# Patient Record
Sex: Female | Born: 1950
Health system: Southern US, Community
[De-identification: ages and names within clinical notes are randomized; demographics above are authoritative.]

## PROBLEM LIST (undated history)

## (undated) DIAGNOSIS — G4730 Sleep apnea, unspecified: Secondary | ICD-10-CM

## (undated) DIAGNOSIS — K219 Gastro-esophageal reflux disease without esophagitis: Secondary | ICD-10-CM

## (undated) DIAGNOSIS — I10 Essential (primary) hypertension: Secondary | ICD-10-CM

## (undated) DIAGNOSIS — J449 Chronic obstructive pulmonary disease, unspecified: Secondary | ICD-10-CM

## (undated) DIAGNOSIS — K5900 Constipation, unspecified: Secondary | ICD-10-CM

## (undated) DIAGNOSIS — F419 Anxiety disorder, unspecified: Secondary | ICD-10-CM

## (undated) DIAGNOSIS — I219 Acute myocardial infarction, unspecified: Secondary | ICD-10-CM

## (undated) DIAGNOSIS — J45909 Unspecified asthma, uncomplicated: Secondary | ICD-10-CM

## (undated) DIAGNOSIS — E119 Type 2 diabetes mellitus without complications: Secondary | ICD-10-CM

## (undated) DIAGNOSIS — H409 Unspecified glaucoma: Secondary | ICD-10-CM

## (undated) DIAGNOSIS — K635 Polyp of colon: Secondary | ICD-10-CM

## (undated) DIAGNOSIS — I639 Cerebral infarction, unspecified: Secondary | ICD-10-CM

## (undated) DIAGNOSIS — R32 Unspecified urinary incontinence: Secondary | ICD-10-CM

## (undated) DIAGNOSIS — F329 Major depressive disorder, single episode, unspecified: Secondary | ICD-10-CM

## (undated) DIAGNOSIS — T7840XA Allergy, unspecified, initial encounter: Secondary | ICD-10-CM

## (undated) DIAGNOSIS — F32A Depression, unspecified: Secondary | ICD-10-CM

## (undated) HISTORY — DX: Depression, unspecified: F32.A

## (undated) HISTORY — DX: Cerebral infarction, unspecified: I63.9

## (undated) HISTORY — PX: CHOLECYSTECTOMY: SHX55

## (undated) HISTORY — PX: VAGINAL HYSTERECTOMY: SUR661

## (undated) HISTORY — PX: APPENDECTOMY: SHX54

## (undated) HISTORY — DX: Type 2 diabetes mellitus without complications: E11.9

## (undated) HISTORY — DX: Polyp of colon: K63.5

## (undated) HISTORY — PX: GASTRECTOMY: SHX58

## (undated) HISTORY — DX: Acute myocardial infarction, unspecified: I21.9

## (undated) HISTORY — DX: Unspecified glaucoma: H40.9

## (undated) HISTORY — DX: Chronic obstructive pulmonary disease, unspecified: J44.9

## (undated) HISTORY — DX: Essential (primary) hypertension: I10

## (undated) HISTORY — DX: Major depressive disorder, single episode, unspecified: F32.9

## (undated) HISTORY — DX: Constipation, unspecified: K59.00

## (undated) HISTORY — DX: Unspecified urinary incontinence: R32

## (undated) HISTORY — DX: Anxiety disorder, unspecified: F41.9

## (undated) HISTORY — DX: Allergy, unspecified, initial encounter: T78.40XA

## (undated) HISTORY — DX: Gastro-esophageal reflux disease without esophagitis: K21.9

## (undated) HISTORY — PX: TUBAL LIGATION: SHX77

---

## 1998-03-04 ENCOUNTER — Other Ambulatory Visit: Admission: RE | Admit: 1998-03-04 | Discharge: 1998-03-04 | Payer: Self-pay | Admitting: Family Medicine

## 2005-06-18 ENCOUNTER — Ambulatory Visit: Payer: Self-pay | Admitting: Internal Medicine

## 2006-06-23 ENCOUNTER — Ambulatory Visit: Payer: Self-pay | Admitting: Internal Medicine

## 2006-10-22 ENCOUNTER — Emergency Department: Payer: Self-pay | Admitting: Emergency Medicine

## 2006-10-22 ENCOUNTER — Other Ambulatory Visit: Payer: Self-pay

## 2006-11-05 ENCOUNTER — Ambulatory Visit: Payer: Self-pay | Admitting: Internal Medicine

## 2007-06-30 ENCOUNTER — Ambulatory Visit: Payer: Self-pay | Admitting: Gastroenterology

## 2007-07-27 ENCOUNTER — Ambulatory Visit: Payer: Self-pay | Admitting: Internal Medicine

## 2007-09-02 ENCOUNTER — Ambulatory Visit: Payer: Self-pay | Admitting: Internal Medicine

## 2007-09-16 ENCOUNTER — Ambulatory Visit: Payer: Self-pay | Admitting: Internal Medicine

## 2007-09-22 ENCOUNTER — Ambulatory Visit: Payer: Self-pay | Admitting: Internal Medicine

## 2008-06-08 ENCOUNTER — Ambulatory Visit: Payer: Self-pay | Admitting: Podiatry

## 2008-06-12 ENCOUNTER — Ambulatory Visit: Payer: Self-pay | Admitting: Podiatry

## 2008-06-15 ENCOUNTER — Ambulatory Visit: Payer: Self-pay | Admitting: Podiatry

## 2008-06-15 ENCOUNTER — Emergency Department: Payer: Self-pay | Admitting: Emergency Medicine

## 2008-09-11 ENCOUNTER — Ambulatory Visit: Payer: Self-pay | Admitting: Internal Medicine

## 2009-03-13 ENCOUNTER — Ambulatory Visit: Payer: Self-pay | Admitting: Internal Medicine

## 2010-01-30 ENCOUNTER — Ambulatory Visit: Payer: Self-pay | Admitting: Internal Medicine

## 2011-06-19 ENCOUNTER — Ambulatory Visit: Payer: Self-pay

## 2011-12-08 DIAGNOSIS — F331 Major depressive disorder, recurrent, moderate: Secondary | ICD-10-CM | POA: Diagnosis not present

## 2011-12-15 DIAGNOSIS — F331 Major depressive disorder, recurrent, moderate: Secondary | ICD-10-CM | POA: Diagnosis not present

## 2011-12-21 DIAGNOSIS — Z634 Disappearance and death of family member: Secondary | ICD-10-CM | POA: Diagnosis not present

## 2011-12-21 DIAGNOSIS — F4310 Post-traumatic stress disorder, unspecified: Secondary | ICD-10-CM | POA: Diagnosis not present

## 2011-12-21 DIAGNOSIS — F331 Major depressive disorder, recurrent, moderate: Secondary | ICD-10-CM | POA: Diagnosis not present

## 2011-12-22 DIAGNOSIS — F331 Major depressive disorder, recurrent, moderate: Secondary | ICD-10-CM | POA: Diagnosis not present

## 2011-12-29 DIAGNOSIS — F331 Major depressive disorder, recurrent, moderate: Secondary | ICD-10-CM | POA: Diagnosis not present

## 2012-01-05 DIAGNOSIS — F331 Major depressive disorder, recurrent, moderate: Secondary | ICD-10-CM | POA: Diagnosis not present

## 2012-01-19 DIAGNOSIS — F331 Major depressive disorder, recurrent, moderate: Secondary | ICD-10-CM | POA: Diagnosis not present

## 2012-02-11 DIAGNOSIS — F331 Major depressive disorder, recurrent, moderate: Secondary | ICD-10-CM | POA: Diagnosis not present

## 2012-02-11 DIAGNOSIS — F4310 Post-traumatic stress disorder, unspecified: Secondary | ICD-10-CM | POA: Diagnosis not present

## 2012-02-25 DIAGNOSIS — F331 Major depressive disorder, recurrent, moderate: Secondary | ICD-10-CM | POA: Diagnosis not present

## 2012-03-01 DIAGNOSIS — F331 Major depressive disorder, recurrent, moderate: Secondary | ICD-10-CM | POA: Diagnosis not present

## 2012-03-08 DIAGNOSIS — F331 Major depressive disorder, recurrent, moderate: Secondary | ICD-10-CM | POA: Diagnosis not present

## 2012-03-15 DIAGNOSIS — F331 Major depressive disorder, recurrent, moderate: Secondary | ICD-10-CM | POA: Diagnosis not present

## 2012-03-21 DIAGNOSIS — G4710 Hypersomnia, unspecified: Secondary | ICD-10-CM | POA: Diagnosis not present

## 2012-03-21 DIAGNOSIS — J45909 Unspecified asthma, uncomplicated: Secondary | ICD-10-CM | POA: Diagnosis not present

## 2012-03-21 DIAGNOSIS — J309 Allergic rhinitis, unspecified: Secondary | ICD-10-CM | POA: Diagnosis not present

## 2012-03-23 DIAGNOSIS — F331 Major depressive disorder, recurrent, moderate: Secondary | ICD-10-CM | POA: Diagnosis not present

## 2012-04-12 DIAGNOSIS — F331 Major depressive disorder, recurrent, moderate: Secondary | ICD-10-CM | POA: Diagnosis not present

## 2012-04-26 DIAGNOSIS — F331 Major depressive disorder, recurrent, moderate: Secondary | ICD-10-CM | POA: Diagnosis not present

## 2012-05-03 DIAGNOSIS — F331 Major depressive disorder, recurrent, moderate: Secondary | ICD-10-CM | POA: Diagnosis not present

## 2012-05-12 DIAGNOSIS — F331 Major depressive disorder, recurrent, moderate: Secondary | ICD-10-CM | POA: Diagnosis not present

## 2012-05-12 DIAGNOSIS — F4310 Post-traumatic stress disorder, unspecified: Secondary | ICD-10-CM | POA: Diagnosis not present

## 2012-05-23 DIAGNOSIS — R5383 Other fatigue: Secondary | ICD-10-CM | POA: Diagnosis not present

## 2012-05-23 DIAGNOSIS — D649 Anemia, unspecified: Secondary | ICD-10-CM | POA: Diagnosis not present

## 2012-05-23 DIAGNOSIS — I10 Essential (primary) hypertension: Secondary | ICD-10-CM | POA: Diagnosis not present

## 2012-05-23 DIAGNOSIS — R5381 Other malaise: Secondary | ICD-10-CM | POA: Diagnosis not present

## 2012-05-23 DIAGNOSIS — J45909 Unspecified asthma, uncomplicated: Secondary | ICD-10-CM | POA: Diagnosis not present

## 2012-05-23 DIAGNOSIS — E119 Type 2 diabetes mellitus without complications: Secondary | ICD-10-CM | POA: Diagnosis not present

## 2012-05-23 DIAGNOSIS — F411 Generalized anxiety disorder: Secondary | ICD-10-CM | POA: Diagnosis not present

## 2012-05-23 DIAGNOSIS — J309 Allergic rhinitis, unspecified: Secondary | ICD-10-CM | POA: Diagnosis not present

## 2012-05-24 DIAGNOSIS — F331 Major depressive disorder, recurrent, moderate: Secondary | ICD-10-CM | POA: Diagnosis not present

## 2012-05-25 DIAGNOSIS — H409 Unspecified glaucoma: Secondary | ICD-10-CM | POA: Diagnosis not present

## 2012-05-25 DIAGNOSIS — H4011X0 Primary open-angle glaucoma, stage unspecified: Secondary | ICD-10-CM | POA: Diagnosis not present

## 2012-06-14 DIAGNOSIS — F331 Major depressive disorder, recurrent, moderate: Secondary | ICD-10-CM | POA: Diagnosis not present

## 2012-06-20 ENCOUNTER — Ambulatory Visit: Payer: Self-pay

## 2012-06-20 DIAGNOSIS — Z1231 Encounter for screening mammogram for malignant neoplasm of breast: Secondary | ICD-10-CM | POA: Diagnosis not present

## 2012-06-28 DIAGNOSIS — F331 Major depressive disorder, recurrent, moderate: Secondary | ICD-10-CM | POA: Diagnosis not present

## 2012-07-07 DIAGNOSIS — Z634 Disappearance and death of family member: Secondary | ICD-10-CM | POA: Diagnosis not present

## 2012-07-07 DIAGNOSIS — F339 Major depressive disorder, recurrent, unspecified: Secondary | ICD-10-CM | POA: Diagnosis not present

## 2012-07-07 DIAGNOSIS — F411 Generalized anxiety disorder: Secondary | ICD-10-CM | POA: Diagnosis not present

## 2012-07-07 DIAGNOSIS — Z79899 Other long term (current) drug therapy: Secondary | ICD-10-CM | POA: Diagnosis not present

## 2012-07-07 DIAGNOSIS — Z1339 Encounter for screening examination for other mental health and behavioral disorders: Secondary | ICD-10-CM | POA: Diagnosis not present

## 2012-07-07 DIAGNOSIS — Z5181 Encounter for therapeutic drug level monitoring: Secondary | ICD-10-CM | POA: Diagnosis not present

## 2012-07-12 DIAGNOSIS — F331 Major depressive disorder, recurrent, moderate: Secondary | ICD-10-CM | POA: Diagnosis not present

## 2012-07-19 DIAGNOSIS — H1130 Conjunctival hemorrhage, unspecified eye: Secondary | ICD-10-CM | POA: Diagnosis not present

## 2012-07-26 DIAGNOSIS — F331 Major depressive disorder, recurrent, moderate: Secondary | ICD-10-CM | POA: Diagnosis not present

## 2012-08-02 DIAGNOSIS — F331 Major depressive disorder, recurrent, moderate: Secondary | ICD-10-CM | POA: Diagnosis not present

## 2012-08-05 DIAGNOSIS — E559 Vitamin D deficiency, unspecified: Secondary | ICD-10-CM | POA: Diagnosis not present

## 2012-08-05 DIAGNOSIS — F411 Generalized anxiety disorder: Secondary | ICD-10-CM | POA: Diagnosis not present

## 2012-08-05 DIAGNOSIS — R300 Dysuria: Secondary | ICD-10-CM | POA: Diagnosis not present

## 2012-08-05 DIAGNOSIS — IMO0001 Reserved for inherently not codable concepts without codable children: Secondary | ICD-10-CM | POA: Diagnosis not present

## 2012-08-05 DIAGNOSIS — E038 Other specified hypothyroidism: Secondary | ICD-10-CM | POA: Diagnosis not present

## 2012-08-05 DIAGNOSIS — E119 Type 2 diabetes mellitus without complications: Secondary | ICD-10-CM | POA: Diagnosis not present

## 2012-08-05 DIAGNOSIS — J209 Acute bronchitis, unspecified: Secondary | ICD-10-CM | POA: Diagnosis not present

## 2012-08-05 DIAGNOSIS — R05 Cough: Secondary | ICD-10-CM | POA: Diagnosis not present

## 2012-08-05 DIAGNOSIS — Z0000 Encounter for general adult medical examination without abnormal findings: Secondary | ICD-10-CM | POA: Diagnosis not present

## 2012-08-05 DIAGNOSIS — R059 Cough, unspecified: Secondary | ICD-10-CM | POA: Diagnosis not present

## 2012-08-05 DIAGNOSIS — I10 Essential (primary) hypertension: Secondary | ICD-10-CM | POA: Diagnosis not present

## 2012-08-16 DIAGNOSIS — F331 Major depressive disorder, recurrent, moderate: Secondary | ICD-10-CM | POA: Diagnosis not present

## 2012-08-17 DIAGNOSIS — E876 Hypokalemia: Secondary | ICD-10-CM | POA: Diagnosis not present

## 2012-08-17 DIAGNOSIS — J45909 Unspecified asthma, uncomplicated: Secondary | ICD-10-CM | POA: Diagnosis not present

## 2012-08-17 DIAGNOSIS — R059 Cough, unspecified: Secondary | ICD-10-CM | POA: Diagnosis not present

## 2012-08-17 DIAGNOSIS — E559 Vitamin D deficiency, unspecified: Secondary | ICD-10-CM | POA: Diagnosis not present

## 2012-08-17 DIAGNOSIS — E119 Type 2 diabetes mellitus without complications: Secondary | ICD-10-CM | POA: Diagnosis not present

## 2012-08-17 DIAGNOSIS — J209 Acute bronchitis, unspecified: Secondary | ICD-10-CM | POA: Diagnosis not present

## 2012-08-17 DIAGNOSIS — R05 Cough: Secondary | ICD-10-CM | POA: Diagnosis not present

## 2012-08-30 DIAGNOSIS — F331 Major depressive disorder, recurrent, moderate: Secondary | ICD-10-CM | POA: Diagnosis not present

## 2012-09-06 DIAGNOSIS — F331 Major depressive disorder, recurrent, moderate: Secondary | ICD-10-CM | POA: Diagnosis not present

## 2012-09-14 DIAGNOSIS — F4310 Post-traumatic stress disorder, unspecified: Secondary | ICD-10-CM | POA: Diagnosis not present

## 2012-09-14 DIAGNOSIS — F339 Major depressive disorder, recurrent, unspecified: Secondary | ICD-10-CM | POA: Diagnosis not present

## 2012-09-14 DIAGNOSIS — F39 Unspecified mood [affective] disorder: Secondary | ICD-10-CM | POA: Diagnosis not present

## 2012-09-15 DIAGNOSIS — F17200 Nicotine dependence, unspecified, uncomplicated: Secondary | ICD-10-CM | POA: Diagnosis not present

## 2012-09-15 DIAGNOSIS — Z23 Encounter for immunization: Secondary | ICD-10-CM | POA: Diagnosis not present

## 2012-09-15 DIAGNOSIS — G4730 Sleep apnea, unspecified: Secondary | ICD-10-CM | POA: Diagnosis not present

## 2012-09-15 DIAGNOSIS — J45909 Unspecified asthma, uncomplicated: Secondary | ICD-10-CM | POA: Diagnosis not present

## 2012-09-15 DIAGNOSIS — G4710 Hypersomnia, unspecified: Secondary | ICD-10-CM | POA: Diagnosis not present

## 2012-09-27 DIAGNOSIS — F331 Major depressive disorder, recurrent, moderate: Secondary | ICD-10-CM | POA: Diagnosis not present

## 2012-10-11 DIAGNOSIS — F331 Major depressive disorder, recurrent, moderate: Secondary | ICD-10-CM | POA: Diagnosis not present

## 2012-10-20 DIAGNOSIS — H409 Unspecified glaucoma: Secondary | ICD-10-CM | POA: Diagnosis not present

## 2012-10-20 DIAGNOSIS — H4011X0 Primary open-angle glaucoma, stage unspecified: Secondary | ICD-10-CM | POA: Diagnosis not present

## 2012-11-08 DIAGNOSIS — F331 Major depressive disorder, recurrent, moderate: Secondary | ICD-10-CM | POA: Diagnosis not present

## 2012-11-15 DIAGNOSIS — F331 Major depressive disorder, recurrent, moderate: Secondary | ICD-10-CM | POA: Diagnosis not present

## 2012-11-17 ENCOUNTER — Ambulatory Visit: Payer: Self-pay | Admitting: Gastroenterology

## 2012-11-17 DIAGNOSIS — Z79899 Other long term (current) drug therapy: Secondary | ICD-10-CM | POA: Diagnosis not present

## 2012-11-17 DIAGNOSIS — Z800 Family history of malignant neoplasm of digestive organs: Secondary | ICD-10-CM | POA: Diagnosis not present

## 2012-11-17 DIAGNOSIS — D128 Benign neoplasm of rectum: Secondary | ICD-10-CM | POA: Diagnosis not present

## 2012-11-17 DIAGNOSIS — Z1211 Encounter for screening for malignant neoplasm of colon: Secondary | ICD-10-CM | POA: Diagnosis not present

## 2012-11-17 DIAGNOSIS — Z888 Allergy status to other drugs, medicaments and biological substances status: Secondary | ICD-10-CM | POA: Diagnosis not present

## 2012-11-17 DIAGNOSIS — D129 Benign neoplasm of anus and anal canal: Secondary | ICD-10-CM | POA: Diagnosis not present

## 2012-11-17 DIAGNOSIS — G609 Hereditary and idiopathic neuropathy, unspecified: Secondary | ICD-10-CM | POA: Diagnosis not present

## 2012-11-17 DIAGNOSIS — F17200 Nicotine dependence, unspecified, uncomplicated: Secondary | ICD-10-CM | POA: Diagnosis not present

## 2012-11-17 DIAGNOSIS — D126 Benign neoplasm of colon, unspecified: Secondary | ICD-10-CM | POA: Diagnosis not present

## 2012-11-17 DIAGNOSIS — J45909 Unspecified asthma, uncomplicated: Secondary | ICD-10-CM | POA: Diagnosis not present

## 2012-11-18 LAB — PATHOLOGY REPORT

## 2012-11-22 DIAGNOSIS — F331 Major depressive disorder, recurrent, moderate: Secondary | ICD-10-CM | POA: Diagnosis not present

## 2012-11-28 DIAGNOSIS — B353 Tinea pedis: Secondary | ICD-10-CM | POA: Diagnosis not present

## 2012-11-28 DIAGNOSIS — K219 Gastro-esophageal reflux disease without esophagitis: Secondary | ICD-10-CM | POA: Diagnosis not present

## 2012-11-28 DIAGNOSIS — E119 Type 2 diabetes mellitus without complications: Secondary | ICD-10-CM | POA: Diagnosis not present

## 2012-11-28 DIAGNOSIS — B351 Tinea unguium: Secondary | ICD-10-CM | POA: Diagnosis not present

## 2012-11-28 DIAGNOSIS — M25519 Pain in unspecified shoulder: Secondary | ICD-10-CM | POA: Diagnosis not present

## 2012-11-28 DIAGNOSIS — I10 Essential (primary) hypertension: Secondary | ICD-10-CM | POA: Diagnosis not present

## 2012-11-28 DIAGNOSIS — J45909 Unspecified asthma, uncomplicated: Secondary | ICD-10-CM | POA: Diagnosis not present

## 2012-11-29 DIAGNOSIS — H409 Unspecified glaucoma: Secondary | ICD-10-CM | POA: Diagnosis not present

## 2012-11-29 DIAGNOSIS — H4011X0 Primary open-angle glaucoma, stage unspecified: Secondary | ICD-10-CM | POA: Diagnosis not present

## 2012-12-27 DIAGNOSIS — F331 Major depressive disorder, recurrent, moderate: Secondary | ICD-10-CM | POA: Diagnosis not present

## 2013-01-05 DIAGNOSIS — F39 Unspecified mood [affective] disorder: Secondary | ICD-10-CM | POA: Diagnosis not present

## 2013-01-05 DIAGNOSIS — F411 Generalized anxiety disorder: Secondary | ICD-10-CM | POA: Diagnosis not present

## 2013-01-05 DIAGNOSIS — F339 Major depressive disorder, recurrent, unspecified: Secondary | ICD-10-CM | POA: Diagnosis not present

## 2013-01-05 DIAGNOSIS — Z5181 Encounter for therapeutic drug level monitoring: Secondary | ICD-10-CM | POA: Diagnosis not present

## 2013-01-05 DIAGNOSIS — F4310 Post-traumatic stress disorder, unspecified: Secondary | ICD-10-CM | POA: Diagnosis not present

## 2013-01-05 DIAGNOSIS — Z634 Disappearance and death of family member: Secondary | ICD-10-CM | POA: Diagnosis not present

## 2013-01-05 DIAGNOSIS — Z1339 Encounter for screening examination for other mental health and behavioral disorders: Secondary | ICD-10-CM | POA: Diagnosis not present

## 2013-01-05 DIAGNOSIS — Z79899 Other long term (current) drug therapy: Secondary | ICD-10-CM | POA: Diagnosis not present

## 2013-01-24 DIAGNOSIS — F331 Major depressive disorder, recurrent, moderate: Secondary | ICD-10-CM | POA: Diagnosis not present

## 2013-01-27 DIAGNOSIS — B351 Tinea unguium: Secondary | ICD-10-CM | POA: Diagnosis not present

## 2013-01-27 DIAGNOSIS — M79609 Pain in unspecified limb: Secondary | ICD-10-CM | POA: Diagnosis not present

## 2013-01-27 DIAGNOSIS — M2040 Other hammer toe(s) (acquired), unspecified foot: Secondary | ICD-10-CM | POA: Diagnosis not present

## 2013-01-27 DIAGNOSIS — M2020 Hallux rigidus, unspecified foot: Secondary | ICD-10-CM | POA: Diagnosis not present

## 2013-01-31 DIAGNOSIS — F331 Major depressive disorder, recurrent, moderate: Secondary | ICD-10-CM | POA: Diagnosis not present

## 2013-02-07 DIAGNOSIS — F331 Major depressive disorder, recurrent, moderate: Secondary | ICD-10-CM | POA: Diagnosis not present

## 2013-02-28 DIAGNOSIS — F331 Major depressive disorder, recurrent, moderate: Secondary | ICD-10-CM | POA: Diagnosis not present

## 2013-03-03 DIAGNOSIS — J45909 Unspecified asthma, uncomplicated: Secondary | ICD-10-CM | POA: Diagnosis not present

## 2013-03-03 DIAGNOSIS — N39498 Other specified urinary incontinence: Secondary | ICD-10-CM | POA: Diagnosis not present

## 2013-03-03 DIAGNOSIS — N3945 Continuous leakage: Secondary | ICD-10-CM | POA: Diagnosis not present

## 2013-03-03 DIAGNOSIS — I10 Essential (primary) hypertension: Secondary | ICD-10-CM | POA: Diagnosis not present

## 2013-03-03 DIAGNOSIS — E119 Type 2 diabetes mellitus without complications: Secondary | ICD-10-CM | POA: Diagnosis not present

## 2013-03-07 DIAGNOSIS — F331 Major depressive disorder, recurrent, moderate: Secondary | ICD-10-CM | POA: Diagnosis not present

## 2013-04-04 DIAGNOSIS — N39498 Other specified urinary incontinence: Secondary | ICD-10-CM | POA: Diagnosis not present

## 2013-04-07 DIAGNOSIS — R5383 Other fatigue: Secondary | ICD-10-CM | POA: Diagnosis not present

## 2013-04-07 DIAGNOSIS — K219 Gastro-esophageal reflux disease without esophagitis: Secondary | ICD-10-CM | POA: Diagnosis not present

## 2013-04-07 DIAGNOSIS — R5381 Other malaise: Secondary | ICD-10-CM | POA: Diagnosis not present

## 2013-04-07 DIAGNOSIS — I10 Essential (primary) hypertension: Secondary | ICD-10-CM | POA: Diagnosis not present

## 2013-04-07 DIAGNOSIS — J45909 Unspecified asthma, uncomplicated: Secondary | ICD-10-CM | POA: Diagnosis not present

## 2013-04-07 DIAGNOSIS — J309 Allergic rhinitis, unspecified: Secondary | ICD-10-CM | POA: Diagnosis not present

## 2013-04-12 DIAGNOSIS — F39 Unspecified mood [affective] disorder: Secondary | ICD-10-CM | POA: Diagnosis not present

## 2013-04-12 DIAGNOSIS — F411 Generalized anxiety disorder: Secondary | ICD-10-CM | POA: Diagnosis not present

## 2013-04-12 DIAGNOSIS — F339 Major depressive disorder, recurrent, unspecified: Secondary | ICD-10-CM | POA: Diagnosis not present

## 2013-05-18 DIAGNOSIS — F4310 Post-traumatic stress disorder, unspecified: Secondary | ICD-10-CM | POA: Diagnosis not present

## 2013-05-18 DIAGNOSIS — F411 Generalized anxiety disorder: Secondary | ICD-10-CM | POA: Diagnosis not present

## 2013-05-18 DIAGNOSIS — F339 Major depressive disorder, recurrent, unspecified: Secondary | ICD-10-CM | POA: Diagnosis not present

## 2013-05-26 DIAGNOSIS — F411 Generalized anxiety disorder: Secondary | ICD-10-CM | POA: Diagnosis not present

## 2013-05-26 DIAGNOSIS — F339 Major depressive disorder, recurrent, unspecified: Secondary | ICD-10-CM | POA: Diagnosis not present

## 2013-05-26 DIAGNOSIS — F4310 Post-traumatic stress disorder, unspecified: Secondary | ICD-10-CM | POA: Diagnosis not present

## 2013-06-09 DIAGNOSIS — F339 Major depressive disorder, recurrent, unspecified: Secondary | ICD-10-CM | POA: Diagnosis not present

## 2013-06-09 DIAGNOSIS — F411 Generalized anxiety disorder: Secondary | ICD-10-CM | POA: Diagnosis not present

## 2013-06-09 DIAGNOSIS — F4310 Post-traumatic stress disorder, unspecified: Secondary | ICD-10-CM | POA: Diagnosis not present

## 2013-07-07 DIAGNOSIS — F339 Major depressive disorder, recurrent, unspecified: Secondary | ICD-10-CM | POA: Diagnosis not present

## 2013-07-07 DIAGNOSIS — F411 Generalized anxiety disorder: Secondary | ICD-10-CM | POA: Diagnosis not present

## 2013-07-07 DIAGNOSIS — F4310 Post-traumatic stress disorder, unspecified: Secondary | ICD-10-CM | POA: Diagnosis not present

## 2013-07-13 DIAGNOSIS — F4310 Post-traumatic stress disorder, unspecified: Secondary | ICD-10-CM | POA: Diagnosis not present

## 2013-07-13 DIAGNOSIS — F339 Major depressive disorder, recurrent, unspecified: Secondary | ICD-10-CM | POA: Diagnosis not present

## 2013-07-13 DIAGNOSIS — F39 Unspecified mood [affective] disorder: Secondary | ICD-10-CM | POA: Diagnosis not present

## 2013-08-04 DIAGNOSIS — F39 Unspecified mood [affective] disorder: Secondary | ICD-10-CM | POA: Diagnosis not present

## 2013-08-04 DIAGNOSIS — F339 Major depressive disorder, recurrent, unspecified: Secondary | ICD-10-CM | POA: Diagnosis not present

## 2013-08-04 DIAGNOSIS — F411 Generalized anxiety disorder: Secondary | ICD-10-CM | POA: Diagnosis not present

## 2013-08-11 DIAGNOSIS — F39 Unspecified mood [affective] disorder: Secondary | ICD-10-CM | POA: Diagnosis not present

## 2013-08-11 DIAGNOSIS — F411 Generalized anxiety disorder: Secondary | ICD-10-CM | POA: Diagnosis not present

## 2013-08-11 DIAGNOSIS — F339 Major depressive disorder, recurrent, unspecified: Secondary | ICD-10-CM | POA: Diagnosis not present

## 2013-08-22 DIAGNOSIS — F331 Major depressive disorder, recurrent, moderate: Secondary | ICD-10-CM | POA: Diagnosis not present

## 2013-08-22 DIAGNOSIS — F411 Generalized anxiety disorder: Secondary | ICD-10-CM | POA: Diagnosis not present

## 2013-09-05 DIAGNOSIS — F331 Major depressive disorder, recurrent, moderate: Secondary | ICD-10-CM | POA: Diagnosis not present

## 2013-10-05 DIAGNOSIS — F331 Major depressive disorder, recurrent, moderate: Secondary | ICD-10-CM | POA: Diagnosis not present

## 2013-10-12 DIAGNOSIS — IMO0001 Reserved for inherently not codable concepts without codable children: Secondary | ICD-10-CM | POA: Diagnosis not present

## 2013-10-12 DIAGNOSIS — E119 Type 2 diabetes mellitus without complications: Secondary | ICD-10-CM | POA: Diagnosis not present

## 2013-10-12 DIAGNOSIS — F329 Major depressive disorder, single episode, unspecified: Secondary | ICD-10-CM | POA: Diagnosis not present

## 2013-10-12 DIAGNOSIS — F17200 Nicotine dependence, unspecified, uncomplicated: Secondary | ICD-10-CM | POA: Diagnosis not present

## 2013-10-12 DIAGNOSIS — F4310 Post-traumatic stress disorder, unspecified: Secondary | ICD-10-CM | POA: Diagnosis not present

## 2013-10-12 DIAGNOSIS — J309 Allergic rhinitis, unspecified: Secondary | ICD-10-CM | POA: Diagnosis not present

## 2013-10-12 DIAGNOSIS — E782 Mixed hyperlipidemia: Secondary | ICD-10-CM | POA: Diagnosis not present

## 2013-10-12 DIAGNOSIS — J45901 Unspecified asthma with (acute) exacerbation: Secondary | ICD-10-CM | POA: Diagnosis not present

## 2013-10-12 DIAGNOSIS — I10 Essential (primary) hypertension: Secondary | ICD-10-CM | POA: Diagnosis not present

## 2013-10-12 DIAGNOSIS — Z23 Encounter for immunization: Secondary | ICD-10-CM | POA: Diagnosis not present

## 2013-10-12 DIAGNOSIS — K219 Gastro-esophageal reflux disease without esophagitis: Secondary | ICD-10-CM | POA: Diagnosis not present

## 2013-10-12 DIAGNOSIS — Z0000 Encounter for general adult medical examination without abnormal findings: Secondary | ICD-10-CM | POA: Diagnosis not present

## 2013-10-12 DIAGNOSIS — J45909 Unspecified asthma, uncomplicated: Secondary | ICD-10-CM | POA: Diagnosis not present

## 2013-10-12 DIAGNOSIS — E559 Vitamin D deficiency, unspecified: Secondary | ICD-10-CM | POA: Diagnosis not present

## 2013-11-07 DIAGNOSIS — F17200 Nicotine dependence, unspecified, uncomplicated: Secondary | ICD-10-CM | POA: Diagnosis not present

## 2013-11-07 DIAGNOSIS — J45909 Unspecified asthma, uncomplicated: Secondary | ICD-10-CM | POA: Diagnosis not present

## 2013-11-07 DIAGNOSIS — K219 Gastro-esophageal reflux disease without esophagitis: Secondary | ICD-10-CM | POA: Diagnosis not present

## 2013-11-07 DIAGNOSIS — J309 Allergic rhinitis, unspecified: Secondary | ICD-10-CM | POA: Diagnosis not present

## 2013-11-07 DIAGNOSIS — F411 Generalized anxiety disorder: Secondary | ICD-10-CM | POA: Diagnosis not present

## 2013-11-07 DIAGNOSIS — I10 Essential (primary) hypertension: Secondary | ICD-10-CM | POA: Diagnosis not present

## 2013-11-07 DIAGNOSIS — E782 Mixed hyperlipidemia: Secondary | ICD-10-CM | POA: Diagnosis not present

## 2013-11-07 DIAGNOSIS — E119 Type 2 diabetes mellitus without complications: Secondary | ICD-10-CM | POA: Diagnosis not present

## 2013-11-10 ENCOUNTER — Ambulatory Visit: Payer: Self-pay

## 2013-11-10 DIAGNOSIS — Z1231 Encounter for screening mammogram for malignant neoplasm of breast: Secondary | ICD-10-CM | POA: Diagnosis not present

## 2013-11-28 DIAGNOSIS — F331 Major depressive disorder, recurrent, moderate: Secondary | ICD-10-CM | POA: Diagnosis not present

## 2013-12-12 DIAGNOSIS — F331 Major depressive disorder, recurrent, moderate: Secondary | ICD-10-CM | POA: Diagnosis not present

## 2014-01-09 DIAGNOSIS — K219 Gastro-esophageal reflux disease without esophagitis: Secondary | ICD-10-CM | POA: Diagnosis not present

## 2014-01-09 DIAGNOSIS — G63 Polyneuropathy in diseases classified elsewhere: Secondary | ICD-10-CM | POA: Diagnosis not present

## 2014-01-09 DIAGNOSIS — R5383 Other fatigue: Secondary | ICD-10-CM | POA: Diagnosis not present

## 2014-01-09 DIAGNOSIS — E119 Type 2 diabetes mellitus without complications: Secondary | ICD-10-CM | POA: Diagnosis not present

## 2014-01-09 DIAGNOSIS — J45901 Unspecified asthma with (acute) exacerbation: Secondary | ICD-10-CM | POA: Diagnosis not present

## 2014-01-09 DIAGNOSIS — R5381 Other malaise: Secondary | ICD-10-CM | POA: Diagnosis not present

## 2014-01-09 DIAGNOSIS — I10 Essential (primary) hypertension: Secondary | ICD-10-CM | POA: Diagnosis not present

## 2014-01-23 ENCOUNTER — Ambulatory Visit: Payer: Self-pay | Admitting: Physician Assistant

## 2014-01-23 DIAGNOSIS — J309 Allergic rhinitis, unspecified: Secondary | ICD-10-CM | POA: Diagnosis not present

## 2014-01-23 DIAGNOSIS — J209 Acute bronchitis, unspecified: Secondary | ICD-10-CM | POA: Diagnosis not present

## 2014-01-23 DIAGNOSIS — J449 Chronic obstructive pulmonary disease, unspecified: Secondary | ICD-10-CM | POA: Diagnosis not present

## 2014-01-23 DIAGNOSIS — R059 Cough, unspecified: Secondary | ICD-10-CM | POA: Diagnosis not present

## 2014-01-23 DIAGNOSIS — J019 Acute sinusitis, unspecified: Secondary | ICD-10-CM | POA: Diagnosis not present

## 2014-01-23 DIAGNOSIS — R05 Cough: Secondary | ICD-10-CM | POA: Diagnosis not present

## 2014-01-23 DIAGNOSIS — R062 Wheezing: Secondary | ICD-10-CM | POA: Diagnosis not present

## 2014-02-01 DIAGNOSIS — J309 Allergic rhinitis, unspecified: Secondary | ICD-10-CM | POA: Diagnosis not present

## 2014-02-01 DIAGNOSIS — J449 Chronic obstructive pulmonary disease, unspecified: Secondary | ICD-10-CM | POA: Diagnosis not present

## 2014-02-01 DIAGNOSIS — B370 Candidal stomatitis: Secondary | ICD-10-CM | POA: Diagnosis not present

## 2014-02-13 DIAGNOSIS — F331 Major depressive disorder, recurrent, moderate: Secondary | ICD-10-CM | POA: Diagnosis not present

## 2014-02-27 DIAGNOSIS — F331 Major depressive disorder, recurrent, moderate: Secondary | ICD-10-CM | POA: Diagnosis not present

## 2014-04-03 DIAGNOSIS — F331 Major depressive disorder, recurrent, moderate: Secondary | ICD-10-CM | POA: Diagnosis not present

## 2014-04-10 DIAGNOSIS — F331 Major depressive disorder, recurrent, moderate: Secondary | ICD-10-CM | POA: Diagnosis not present

## 2014-04-12 DIAGNOSIS — Z79899 Other long term (current) drug therapy: Secondary | ICD-10-CM | POA: Diagnosis not present

## 2014-04-12 DIAGNOSIS — F339 Major depressive disorder, recurrent, unspecified: Secondary | ICD-10-CM | POA: Diagnosis not present

## 2014-04-12 DIAGNOSIS — F4310 Post-traumatic stress disorder, unspecified: Secondary | ICD-10-CM | POA: Diagnosis not present

## 2014-04-12 DIAGNOSIS — F39 Unspecified mood [affective] disorder: Secondary | ICD-10-CM | POA: Diagnosis not present

## 2014-04-12 DIAGNOSIS — Z1339 Encounter for screening examination for other mental health and behavioral disorders: Secondary | ICD-10-CM | POA: Diagnosis not present

## 2014-04-24 DIAGNOSIS — F331 Major depressive disorder, recurrent, moderate: Secondary | ICD-10-CM | POA: Diagnosis not present

## 2014-05-01 DIAGNOSIS — F331 Major depressive disorder, recurrent, moderate: Secondary | ICD-10-CM | POA: Diagnosis not present

## 2014-05-03 DIAGNOSIS — H4011X0 Primary open-angle glaucoma, stage unspecified: Secondary | ICD-10-CM | POA: Diagnosis not present

## 2014-05-03 DIAGNOSIS — H409 Unspecified glaucoma: Secondary | ICD-10-CM | POA: Diagnosis not present

## 2014-05-03 DIAGNOSIS — H2510 Age-related nuclear cataract, unspecified eye: Secondary | ICD-10-CM | POA: Diagnosis not present

## 2014-05-16 DIAGNOSIS — F39 Unspecified mood [affective] disorder: Secondary | ICD-10-CM | POA: Diagnosis not present

## 2014-05-16 DIAGNOSIS — F339 Major depressive disorder, recurrent, unspecified: Secondary | ICD-10-CM | POA: Diagnosis not present

## 2014-05-16 DIAGNOSIS — F4310 Post-traumatic stress disorder, unspecified: Secondary | ICD-10-CM | POA: Diagnosis not present

## 2014-05-22 DIAGNOSIS — F331 Major depressive disorder, recurrent, moderate: Secondary | ICD-10-CM | POA: Diagnosis not present

## 2014-05-22 DIAGNOSIS — F4310 Post-traumatic stress disorder, unspecified: Secondary | ICD-10-CM | POA: Diagnosis not present

## 2014-08-14 DIAGNOSIS — F331 Major depressive disorder, recurrent, moderate: Secondary | ICD-10-CM | POA: Diagnosis not present

## 2014-08-14 DIAGNOSIS — F4310 Post-traumatic stress disorder, unspecified: Secondary | ICD-10-CM | POA: Diagnosis not present

## 2014-08-17 DIAGNOSIS — F319 Bipolar disorder, unspecified: Secondary | ICD-10-CM | POA: Diagnosis not present

## 2014-08-17 DIAGNOSIS — J301 Allergic rhinitis due to pollen: Secondary | ICD-10-CM | POA: Diagnosis not present

## 2014-08-17 DIAGNOSIS — E1165 Type 2 diabetes mellitus with hyperglycemia: Secondary | ICD-10-CM | POA: Diagnosis not present

## 2014-08-17 DIAGNOSIS — I10 Essential (primary) hypertension: Secondary | ICD-10-CM | POA: Diagnosis not present

## 2014-08-28 DIAGNOSIS — F4310 Post-traumatic stress disorder, unspecified: Secondary | ICD-10-CM | POA: Diagnosis not present

## 2014-08-28 DIAGNOSIS — F331 Major depressive disorder, recurrent, moderate: Secondary | ICD-10-CM | POA: Diagnosis not present

## 2014-09-04 DIAGNOSIS — F4310 Post-traumatic stress disorder, unspecified: Secondary | ICD-10-CM | POA: Diagnosis not present

## 2014-09-04 DIAGNOSIS — F331 Major depressive disorder, recurrent, moderate: Secondary | ICD-10-CM | POA: Diagnosis not present

## 2014-09-11 DIAGNOSIS — F331 Major depressive disorder, recurrent, moderate: Secondary | ICD-10-CM | POA: Diagnosis not present

## 2014-09-11 DIAGNOSIS — F4310 Post-traumatic stress disorder, unspecified: Secondary | ICD-10-CM | POA: Diagnosis not present

## 2014-09-13 DIAGNOSIS — F331 Major depressive disorder, recurrent, moderate: Secondary | ICD-10-CM | POA: Diagnosis not present

## 2014-09-13 DIAGNOSIS — F4310 Post-traumatic stress disorder, unspecified: Secondary | ICD-10-CM | POA: Diagnosis not present

## 2014-09-14 DIAGNOSIS — J4540 Moderate persistent asthma, uncomplicated: Secondary | ICD-10-CM | POA: Diagnosis not present

## 2014-09-14 DIAGNOSIS — I10 Essential (primary) hypertension: Secondary | ICD-10-CM | POA: Diagnosis not present

## 2014-09-14 DIAGNOSIS — E782 Mixed hyperlipidemia: Secondary | ICD-10-CM | POA: Diagnosis not present

## 2014-09-14 DIAGNOSIS — E038 Other specified hypothyroidism: Secondary | ICD-10-CM | POA: Diagnosis not present

## 2014-09-14 DIAGNOSIS — E1165 Type 2 diabetes mellitus with hyperglycemia: Secondary | ICD-10-CM | POA: Diagnosis not present

## 2014-09-14 DIAGNOSIS — F17210 Nicotine dependence, cigarettes, uncomplicated: Secondary | ICD-10-CM | POA: Diagnosis not present

## 2014-09-18 DIAGNOSIS — F4310 Post-traumatic stress disorder, unspecified: Secondary | ICD-10-CM | POA: Diagnosis not present

## 2014-09-18 DIAGNOSIS — F331 Major depressive disorder, recurrent, moderate: Secondary | ICD-10-CM | POA: Diagnosis not present

## 2014-10-09 DIAGNOSIS — F4310 Post-traumatic stress disorder, unspecified: Secondary | ICD-10-CM | POA: Diagnosis not present

## 2014-10-09 DIAGNOSIS — F331 Major depressive disorder, recurrent, moderate: Secondary | ICD-10-CM | POA: Diagnosis not present

## 2014-10-11 DIAGNOSIS — H4011X2 Primary open-angle glaucoma, moderate stage: Secondary | ICD-10-CM | POA: Diagnosis not present

## 2014-10-11 DIAGNOSIS — H2513 Age-related nuclear cataract, bilateral: Secondary | ICD-10-CM | POA: Diagnosis not present

## 2014-10-16 DIAGNOSIS — F4310 Post-traumatic stress disorder, unspecified: Secondary | ICD-10-CM | POA: Diagnosis not present

## 2014-10-16 DIAGNOSIS — F331 Major depressive disorder, recurrent, moderate: Secondary | ICD-10-CM | POA: Diagnosis not present

## 2014-10-30 DIAGNOSIS — F331 Major depressive disorder, recurrent, moderate: Secondary | ICD-10-CM | POA: Diagnosis not present

## 2014-10-30 DIAGNOSIS — F4310 Post-traumatic stress disorder, unspecified: Secondary | ICD-10-CM | POA: Diagnosis not present

## 2014-11-05 DIAGNOSIS — F4310 Post-traumatic stress disorder, unspecified: Secondary | ICD-10-CM | POA: Diagnosis not present

## 2014-11-05 DIAGNOSIS — F331 Major depressive disorder, recurrent, moderate: Secondary | ICD-10-CM | POA: Diagnosis not present

## 2014-11-13 DIAGNOSIS — F331 Major depressive disorder, recurrent, moderate: Secondary | ICD-10-CM | POA: Diagnosis not present

## 2014-11-13 DIAGNOSIS — F4310 Post-traumatic stress disorder, unspecified: Secondary | ICD-10-CM | POA: Diagnosis not present

## 2014-11-20 DIAGNOSIS — Z23 Encounter for immunization: Secondary | ICD-10-CM | POA: Diagnosis not present

## 2014-11-20 DIAGNOSIS — F331 Major depressive disorder, recurrent, moderate: Secondary | ICD-10-CM | POA: Diagnosis not present

## 2014-11-20 DIAGNOSIS — F4310 Post-traumatic stress disorder, unspecified: Secondary | ICD-10-CM | POA: Diagnosis not present

## 2014-12-04 DIAGNOSIS — F331 Major depressive disorder, recurrent, moderate: Secondary | ICD-10-CM | POA: Diagnosis not present

## 2014-12-04 DIAGNOSIS — F4310 Post-traumatic stress disorder, unspecified: Secondary | ICD-10-CM | POA: Diagnosis not present

## 2014-12-11 DIAGNOSIS — F331 Major depressive disorder, recurrent, moderate: Secondary | ICD-10-CM | POA: Diagnosis not present

## 2014-12-11 DIAGNOSIS — F4310 Post-traumatic stress disorder, unspecified: Secondary | ICD-10-CM | POA: Diagnosis not present

## 2014-12-28 DIAGNOSIS — J4540 Moderate persistent asthma, uncomplicated: Secondary | ICD-10-CM | POA: Diagnosis not present

## 2014-12-28 DIAGNOSIS — I10 Essential (primary) hypertension: Secondary | ICD-10-CM | POA: Diagnosis not present

## 2014-12-28 DIAGNOSIS — K5900 Constipation, unspecified: Secondary | ICD-10-CM | POA: Diagnosis not present

## 2014-12-28 DIAGNOSIS — F319 Bipolar disorder, unspecified: Secondary | ICD-10-CM | POA: Diagnosis not present

## 2014-12-28 DIAGNOSIS — F17210 Nicotine dependence, cigarettes, uncomplicated: Secondary | ICD-10-CM | POA: Diagnosis not present

## 2014-12-28 DIAGNOSIS — E1165 Type 2 diabetes mellitus with hyperglycemia: Secondary | ICD-10-CM | POA: Diagnosis not present

## 2015-02-19 DIAGNOSIS — F4310 Post-traumatic stress disorder, unspecified: Secondary | ICD-10-CM | POA: Diagnosis not present

## 2015-02-19 DIAGNOSIS — F331 Major depressive disorder, recurrent, moderate: Secondary | ICD-10-CM | POA: Diagnosis not present

## 2015-02-28 DIAGNOSIS — H4011X2 Primary open-angle glaucoma, moderate stage: Secondary | ICD-10-CM | POA: Diagnosis not present

## 2015-03-12 DIAGNOSIS — F331 Major depressive disorder, recurrent, moderate: Secondary | ICD-10-CM | POA: Diagnosis not present

## 2015-03-12 DIAGNOSIS — F4310 Post-traumatic stress disorder, unspecified: Secondary | ICD-10-CM | POA: Diagnosis not present

## 2015-03-26 DIAGNOSIS — F4310 Post-traumatic stress disorder, unspecified: Secondary | ICD-10-CM | POA: Diagnosis not present

## 2015-03-26 DIAGNOSIS — F331 Major depressive disorder, recurrent, moderate: Secondary | ICD-10-CM | POA: Diagnosis not present

## 2015-04-02 DIAGNOSIS — F331 Major depressive disorder, recurrent, moderate: Secondary | ICD-10-CM | POA: Diagnosis not present

## 2015-04-02 DIAGNOSIS — F4310 Post-traumatic stress disorder, unspecified: Secondary | ICD-10-CM | POA: Diagnosis not present

## 2015-04-24 DIAGNOSIS — F439 Reaction to severe stress, unspecified: Secondary | ICD-10-CM | POA: Diagnosis not present

## 2015-04-24 DIAGNOSIS — F331 Major depressive disorder, recurrent, moderate: Secondary | ICD-10-CM | POA: Diagnosis not present

## 2015-04-24 DIAGNOSIS — F4310 Post-traumatic stress disorder, unspecified: Secondary | ICD-10-CM | POA: Diagnosis not present

## 2015-04-25 DIAGNOSIS — E1165 Type 2 diabetes mellitus with hyperglycemia: Secondary | ICD-10-CM | POA: Diagnosis not present

## 2015-04-25 DIAGNOSIS — F17210 Nicotine dependence, cigarettes, uncomplicated: Secondary | ICD-10-CM | POA: Diagnosis not present

## 2015-04-25 DIAGNOSIS — I10 Essential (primary) hypertension: Secondary | ICD-10-CM | POA: Diagnosis not present

## 2015-04-25 DIAGNOSIS — J4540 Moderate persistent asthma, uncomplicated: Secondary | ICD-10-CM | POA: Diagnosis not present

## 2015-04-25 DIAGNOSIS — M79672 Pain in left foot: Secondary | ICD-10-CM | POA: Diagnosis not present

## 2015-04-25 DIAGNOSIS — K5900 Constipation, unspecified: Secondary | ICD-10-CM | POA: Diagnosis not present

## 2015-04-30 DIAGNOSIS — F331 Major depressive disorder, recurrent, moderate: Secondary | ICD-10-CM | POA: Diagnosis not present

## 2015-04-30 DIAGNOSIS — F4310 Post-traumatic stress disorder, unspecified: Secondary | ICD-10-CM | POA: Diagnosis not present

## 2015-05-14 DIAGNOSIS — F4310 Post-traumatic stress disorder, unspecified: Secondary | ICD-10-CM | POA: Diagnosis not present

## 2015-05-14 DIAGNOSIS — F331 Major depressive disorder, recurrent, moderate: Secondary | ICD-10-CM | POA: Diagnosis not present

## 2015-05-23 DIAGNOSIS — F4312 Post-traumatic stress disorder, chronic: Secondary | ICD-10-CM | POA: Diagnosis not present

## 2015-05-23 DIAGNOSIS — I10 Essential (primary) hypertension: Secondary | ICD-10-CM | POA: Diagnosis not present

## 2015-05-23 DIAGNOSIS — Z0001 Encounter for general adult medical examination with abnormal findings: Secondary | ICD-10-CM | POA: Diagnosis not present

## 2015-05-23 DIAGNOSIS — J4540 Moderate persistent asthma, uncomplicated: Secondary | ICD-10-CM | POA: Diagnosis not present

## 2015-05-23 DIAGNOSIS — E559 Vitamin D deficiency, unspecified: Secondary | ICD-10-CM | POA: Diagnosis not present

## 2015-05-23 DIAGNOSIS — E1165 Type 2 diabetes mellitus with hyperglycemia: Secondary | ICD-10-CM | POA: Diagnosis not present

## 2015-05-23 DIAGNOSIS — E119 Type 2 diabetes mellitus without complications: Secondary | ICD-10-CM | POA: Diagnosis not present

## 2015-05-23 DIAGNOSIS — E782 Mixed hyperlipidemia: Secondary | ICD-10-CM | POA: Diagnosis not present

## 2015-05-28 DIAGNOSIS — F331 Major depressive disorder, recurrent, moderate: Secondary | ICD-10-CM | POA: Diagnosis not present

## 2015-05-28 DIAGNOSIS — F4310 Post-traumatic stress disorder, unspecified: Secondary | ICD-10-CM | POA: Diagnosis not present

## 2015-06-20 DIAGNOSIS — Z1231 Encounter for screening mammogram for malignant neoplasm of breast: Secondary | ICD-10-CM | POA: Diagnosis not present

## 2015-07-09 DIAGNOSIS — F4310 Post-traumatic stress disorder, unspecified: Secondary | ICD-10-CM | POA: Diagnosis not present

## 2015-07-09 DIAGNOSIS — F331 Major depressive disorder, recurrent, moderate: Secondary | ICD-10-CM | POA: Diagnosis not present

## 2015-07-10 DIAGNOSIS — H4011X2 Primary open-angle glaucoma, moderate stage: Secondary | ICD-10-CM | POA: Diagnosis not present

## 2015-08-06 DIAGNOSIS — F331 Major depressive disorder, recurrent, moderate: Secondary | ICD-10-CM | POA: Diagnosis not present

## 2015-08-06 DIAGNOSIS — F4310 Post-traumatic stress disorder, unspecified: Secondary | ICD-10-CM | POA: Diagnosis not present

## 2015-08-20 DIAGNOSIS — F331 Major depressive disorder, recurrent, moderate: Secondary | ICD-10-CM | POA: Diagnosis not present

## 2015-08-20 DIAGNOSIS — F4310 Post-traumatic stress disorder, unspecified: Secondary | ICD-10-CM | POA: Diagnosis not present

## 2015-09-03 DIAGNOSIS — F4310 Post-traumatic stress disorder, unspecified: Secondary | ICD-10-CM | POA: Diagnosis not present

## 2015-09-03 DIAGNOSIS — F331 Major depressive disorder, recurrent, moderate: Secondary | ICD-10-CM | POA: Diagnosis not present

## 2015-09-17 DIAGNOSIS — F4310 Post-traumatic stress disorder, unspecified: Secondary | ICD-10-CM | POA: Diagnosis not present

## 2015-09-17 DIAGNOSIS — F331 Major depressive disorder, recurrent, moderate: Secondary | ICD-10-CM | POA: Diagnosis not present

## 2015-10-01 DIAGNOSIS — F331 Major depressive disorder, recurrent, moderate: Secondary | ICD-10-CM | POA: Diagnosis not present

## 2015-10-01 DIAGNOSIS — F4310 Post-traumatic stress disorder, unspecified: Secondary | ICD-10-CM | POA: Diagnosis not present

## 2015-10-04 DIAGNOSIS — I10 Essential (primary) hypertension: Secondary | ICD-10-CM | POA: Diagnosis not present

## 2015-10-04 DIAGNOSIS — J4540 Moderate persistent asthma, uncomplicated: Secondary | ICD-10-CM | POA: Diagnosis not present

## 2015-10-04 DIAGNOSIS — F319 Bipolar disorder, unspecified: Secondary | ICD-10-CM | POA: Diagnosis not present

## 2015-10-04 DIAGNOSIS — E1165 Type 2 diabetes mellitus with hyperglycemia: Secondary | ICD-10-CM | POA: Diagnosis not present

## 2015-10-04 DIAGNOSIS — Z0001 Encounter for general adult medical examination with abnormal findings: Secondary | ICD-10-CM | POA: Diagnosis not present

## 2015-11-15 DIAGNOSIS — F17210 Nicotine dependence, cigarettes, uncomplicated: Secondary | ICD-10-CM | POA: Diagnosis not present

## 2015-11-15 DIAGNOSIS — J4540 Moderate persistent asthma, uncomplicated: Secondary | ICD-10-CM | POA: Diagnosis not present

## 2015-11-15 DIAGNOSIS — F4312 Post-traumatic stress disorder, chronic: Secondary | ICD-10-CM | POA: Diagnosis not present

## 2015-11-15 DIAGNOSIS — E1165 Type 2 diabetes mellitus with hyperglycemia: Secondary | ICD-10-CM | POA: Diagnosis not present

## 2015-11-15 DIAGNOSIS — I10 Essential (primary) hypertension: Secondary | ICD-10-CM | POA: Diagnosis not present

## 2015-11-19 DIAGNOSIS — F331 Major depressive disorder, recurrent, moderate: Secondary | ICD-10-CM | POA: Diagnosis not present

## 2015-11-19 DIAGNOSIS — F4310 Post-traumatic stress disorder, unspecified: Secondary | ICD-10-CM | POA: Diagnosis not present

## 2015-11-20 DIAGNOSIS — Z23 Encounter for immunization: Secondary | ICD-10-CM | POA: Diagnosis not present

## 2015-12-03 DIAGNOSIS — F331 Major depressive disorder, recurrent, moderate: Secondary | ICD-10-CM | POA: Diagnosis not present

## 2015-12-03 DIAGNOSIS — F4310 Post-traumatic stress disorder, unspecified: Secondary | ICD-10-CM | POA: Diagnosis not present

## 2015-12-17 DIAGNOSIS — F4310 Post-traumatic stress disorder, unspecified: Secondary | ICD-10-CM | POA: Diagnosis not present

## 2015-12-17 DIAGNOSIS — F331 Major depressive disorder, recurrent, moderate: Secondary | ICD-10-CM | POA: Diagnosis not present

## 2016-01-14 DIAGNOSIS — F4310 Post-traumatic stress disorder, unspecified: Secondary | ICD-10-CM | POA: Diagnosis not present

## 2016-01-14 DIAGNOSIS — F331 Major depressive disorder, recurrent, moderate: Secondary | ICD-10-CM | POA: Diagnosis not present

## 2016-01-21 DIAGNOSIS — F331 Major depressive disorder, recurrent, moderate: Secondary | ICD-10-CM | POA: Diagnosis not present

## 2016-01-21 DIAGNOSIS — F439 Reaction to severe stress, unspecified: Secondary | ICD-10-CM | POA: Diagnosis not present

## 2016-01-21 DIAGNOSIS — Z79899 Other long term (current) drug therapy: Secondary | ICD-10-CM | POA: Diagnosis not present

## 2016-01-21 DIAGNOSIS — F4310 Post-traumatic stress disorder, unspecified: Secondary | ICD-10-CM | POA: Diagnosis not present

## 2016-01-28 DIAGNOSIS — F4310 Post-traumatic stress disorder, unspecified: Secondary | ICD-10-CM | POA: Diagnosis not present

## 2016-01-28 DIAGNOSIS — F331 Major depressive disorder, recurrent, moderate: Secondary | ICD-10-CM | POA: Diagnosis not present

## 2016-03-10 DIAGNOSIS — F331 Major depressive disorder, recurrent, moderate: Secondary | ICD-10-CM | POA: Diagnosis not present

## 2016-03-10 DIAGNOSIS — F4310 Post-traumatic stress disorder, unspecified: Secondary | ICD-10-CM | POA: Diagnosis not present

## 2016-04-07 DIAGNOSIS — F4310 Post-traumatic stress disorder, unspecified: Secondary | ICD-10-CM | POA: Diagnosis not present

## 2016-04-07 DIAGNOSIS — F331 Major depressive disorder, recurrent, moderate: Secondary | ICD-10-CM | POA: Diagnosis not present

## 2016-05-19 DIAGNOSIS — F4310 Post-traumatic stress disorder, unspecified: Secondary | ICD-10-CM | POA: Diagnosis not present

## 2016-05-19 DIAGNOSIS — F331 Major depressive disorder, recurrent, moderate: Secondary | ICD-10-CM | POA: Diagnosis not present

## 2016-05-25 DIAGNOSIS — F319 Bipolar disorder, unspecified: Secondary | ICD-10-CM | POA: Diagnosis not present

## 2016-05-25 DIAGNOSIS — K5900 Constipation, unspecified: Secondary | ICD-10-CM | POA: Diagnosis not present

## 2016-05-25 DIAGNOSIS — J4540 Moderate persistent asthma, uncomplicated: Secondary | ICD-10-CM | POA: Diagnosis not present

## 2016-05-25 DIAGNOSIS — F17210 Nicotine dependence, cigarettes, uncomplicated: Secondary | ICD-10-CM | POA: Diagnosis not present

## 2016-05-25 DIAGNOSIS — E1165 Type 2 diabetes mellitus with hyperglycemia: Secondary | ICD-10-CM | POA: Diagnosis not present

## 2016-05-25 DIAGNOSIS — I10 Essential (primary) hypertension: Secondary | ICD-10-CM | POA: Diagnosis not present

## 2016-05-25 DIAGNOSIS — Z634 Disappearance and death of family member: Secondary | ICD-10-CM | POA: Diagnosis not present

## 2016-07-28 DIAGNOSIS — F331 Major depressive disorder, recurrent, moderate: Secondary | ICD-10-CM | POA: Diagnosis not present

## 2016-07-28 DIAGNOSIS — F4310 Post-traumatic stress disorder, unspecified: Secondary | ICD-10-CM | POA: Diagnosis not present

## 2016-08-18 DIAGNOSIS — F4310 Post-traumatic stress disorder, unspecified: Secondary | ICD-10-CM | POA: Diagnosis not present

## 2016-08-18 DIAGNOSIS — F331 Major depressive disorder, recurrent, moderate: Secondary | ICD-10-CM | POA: Diagnosis not present

## 2016-08-19 DIAGNOSIS — F3189 Other bipolar disorder: Secondary | ICD-10-CM | POA: Diagnosis not present

## 2016-08-19 DIAGNOSIS — F4310 Post-traumatic stress disorder, unspecified: Secondary | ICD-10-CM | POA: Diagnosis not present

## 2016-08-19 DIAGNOSIS — F439 Reaction to severe stress, unspecified: Secondary | ICD-10-CM | POA: Diagnosis not present

## 2016-08-25 DIAGNOSIS — F4310 Post-traumatic stress disorder, unspecified: Secondary | ICD-10-CM | POA: Diagnosis not present

## 2016-08-25 DIAGNOSIS — F331 Major depressive disorder, recurrent, moderate: Secondary | ICD-10-CM | POA: Diagnosis not present

## 2016-12-07 ENCOUNTER — Other Ambulatory Visit: Payer: Self-pay | Admitting: Nurse Practitioner

## 2016-12-07 DIAGNOSIS — Z1231 Encounter for screening mammogram for malignant neoplasm of breast: Secondary | ICD-10-CM

## 2016-12-23 DIAGNOSIS — F331 Major depressive disorder, recurrent, moderate: Secondary | ICD-10-CM | POA: Diagnosis not present

## 2016-12-23 DIAGNOSIS — F4310 Post-traumatic stress disorder, unspecified: Secondary | ICD-10-CM | POA: Diagnosis not present

## 2017-01-18 ENCOUNTER — Ambulatory Visit: Payer: Self-pay

## 2017-07-26 ENCOUNTER — Other Ambulatory Visit: Payer: Self-pay | Admitting: Internal Medicine

## 2017-07-26 DIAGNOSIS — K219 Gastro-esophageal reflux disease without esophagitis: Secondary | ICD-10-CM

## 2017-08-06 ENCOUNTER — Other Ambulatory Visit: Payer: Self-pay

## 2017-08-06 ENCOUNTER — Ambulatory Visit: Payer: Self-pay

## 2017-08-12 ENCOUNTER — Inpatient Hospital Stay: Admission: RE | Admit: 2017-08-12 | Payer: Self-pay | Source: Ambulatory Visit

## 2017-08-12 ENCOUNTER — Ambulatory Visit
Admission: RE | Admit: 2017-08-12 | Discharge: 2017-08-12 | Disposition: A | Discharge status: Home / Self Care | Payer: Medicare HMO | Source: Ambulatory Visit | Attending: Internal Medicine | Admitting: Internal Medicine

## 2017-08-12 DIAGNOSIS — K219 Gastro-esophageal reflux disease without esophagitis: Secondary | ICD-10-CM | POA: Insufficient documentation

## 2017-10-12 ENCOUNTER — Other Ambulatory Visit: Payer: Self-pay | Admitting: Internal Medicine

## 2017-11-10 ENCOUNTER — Other Ambulatory Visit: Payer: Self-pay

## 2017-11-10 MED ORDER — PANTOPRAZOLE SODIUM 40 MG PO TBEC: 40.0000 mg | DELAYED_RELEASE_TABLET | Freq: Every day | ORAL | 3 refills | Status: DC

## 2017-11-10 MED ORDER — PROPRANOLOL HCL 40 MG PO TABS: 40.0000 mg | ORAL_TABLET | Freq: Two times a day (BID) | ORAL | 3 refills | Status: DC

## 2017-12-03 ENCOUNTER — Other Ambulatory Visit: Payer: Self-pay | Admitting: Internal Medicine

## 2017-12-06 ENCOUNTER — Other Ambulatory Visit: Payer: Self-pay | Admitting: Internal Medicine

## 2017-12-08 ENCOUNTER — Other Ambulatory Visit: Payer: Self-pay

## 2017-12-08 MED ORDER — FLUTICASONE FUROATE-VILANTEROL 200-25 MCG/INH IN AEPB: 1.0000 | INHALATION_SPRAY | Freq: Every day | RESPIRATORY_TRACT | 3 refills | Status: DC

## 2017-12-08 MED ORDER — MELOXICAM 15 MG PO TABS: 15.0000 mg | ORAL_TABLET | Freq: Every day | ORAL | 1 refills | Status: DC

## 2017-12-09 ENCOUNTER — Other Ambulatory Visit: Payer: Self-pay

## 2017-12-09 MED ORDER — PIOGLITAZONE HCL 30 MG PO TABS: 30.0000 mg | ORAL_TABLET | Freq: Every day | ORAL | 5 refills | Status: DC

## 2017-12-09 MED ORDER — METFORMIN HCL 500 MG PO TABS: 500.0000 mg | ORAL_TABLET | Freq: Two times a day (BID) | ORAL | 5 refills | Status: DC

## 2017-12-09 MED ORDER — GABAPENTIN 300 MG PO CAPS: 300.0000 mg | ORAL_CAPSULE | Freq: Two times a day (BID) | ORAL | 5 refills | Status: DC

## 2017-12-09 MED ORDER — AMLODIPINE BESYLATE 5 MG PO TABS: 5.0000 mg | ORAL_TABLET | Freq: Every day | ORAL | 5 refills | Status: DC

## 2017-12-21 ENCOUNTER — Encounter: Payer: Self-pay | Admitting: Internal Medicine

## 2017-12-21 ENCOUNTER — Ambulatory Visit (INDEPENDENT_AMBULATORY_CARE_PROVIDER_SITE_OTHER): Payer: Medicare HMO | Admitting: Internal Medicine

## 2017-12-21 VITALS — BP 127/89 | HR 101 | Resp 16 | Ht 64.0 in | Wt 207.4 lb

## 2017-12-21 DIAGNOSIS — I10 Essential (primary) hypertension: Secondary | ICD-10-CM | POA: Diagnosis not present

## 2017-12-21 DIAGNOSIS — K219 Gastro-esophageal reflux disease without esophagitis: Secondary | ICD-10-CM | POA: Diagnosis not present

## 2017-12-21 DIAGNOSIS — E1165 Type 2 diabetes mellitus with hyperglycemia: Secondary | ICD-10-CM | POA: Diagnosis not present

## 2017-12-21 DIAGNOSIS — F331 Major depressive disorder, recurrent, moderate: Secondary | ICD-10-CM | POA: Insufficient documentation

## 2017-12-21 DIAGNOSIS — G479 Sleep disorder, unspecified: Secondary | ICD-10-CM

## 2017-12-21 DIAGNOSIS — F329 Major depressive disorder, single episode, unspecified: Secondary | ICD-10-CM | POA: Insufficient documentation

## 2017-12-21 DIAGNOSIS — K5900 Constipation, unspecified: Secondary | ICD-10-CM | POA: Diagnosis not present

## 2017-12-21 DIAGNOSIS — F419 Anxiety disorder, unspecified: Secondary | ICD-10-CM | POA: Insufficient documentation

## 2017-12-21 DIAGNOSIS — F32A Depression, unspecified: Secondary | ICD-10-CM | POA: Insufficient documentation

## 2017-12-21 DIAGNOSIS — F330 Major depressive disorder, recurrent, mild: Secondary | ICD-10-CM | POA: Insufficient documentation

## 2017-12-21 MED ORDER — BISOPROLOL FUMARATE 5 MG PO TABS: 5.0000 mg | ORAL_TABLET | Freq: Every day | ORAL | 6 refills | Status: DC

## 2017-12-21 MED ORDER — METFORMIN HCL 500 MG PO TABS: ORAL_TABLET | ORAL | 5 refills | Status: DC

## 2017-12-21 NOTE — Progress Notes (Signed)
[facility] [place], [place] [address]  Internal MEDICINE  Office Visit Note  Patient Name: Rachel Nielsen  [id number]  [id number]  Date of Service: 01/05/2018  Chief Complaint  Patient presents with  . Diabetes  . Sleep Apnea    Diabetes  She presents for her follow-up diabetic visit. She has type 2 diabetes mellitus. Her disease course has been improving. There are no hypoglycemic associated symptoms. Pertinent negatives for hypoglycemia include no nervousness/anxiousness or tremors. Pertinent negatives for diabetes include no chest pain and no fatigue. Her home blood glucose trend is fluctuating minimally. Her breakfast blood glucose range is generally 130-140 mg/dl.   Pt continues to complian about sleep problems with snoring and EDF. She wants to have sleep study at home. Constipation is better.    Current Medication: Outpatient Encounter Medications as of 12/21/2017  Medication Sig  . clotrimazole (LOTRIMIN) 1 % cream Apply topically.  . ALPRAZolam (XANAX) 0.5 MG tablet   . amLODipine (NORVASC) 5 MG tablet Take 1 tablet (5 mg total) by mouth daily.  . bisoprolol (ZEBETA) 5 MG tablet Take 1 tablet (5 mg total) by mouth daily.  Marland Kitchen buPROPion (WELLBUTRIN XL) 300 MG 24 hr tablet   . busPIRone (BUSPAR) 15 MG tablet   . DULoxetine (CYMBALTA) 60 MG capsule   . fluticasone furoate-vilanterol (BREO ELLIPTA) 200-25 MCG/INH AEPB Inhale 1 puff into the lungs daily.  Marland Kitchen gabapentin (NEURONTIN) 300 MG capsule Take 1 capsule (300 mg total) by mouth 2 (two) times daily.  Marland Kitchen LATUDA 40 MG TABS tablet   . meloxicam (MOBIC) 15 MG tablet Take 1 tablet (15 mg total) by mouth at bedtime.  . metFORMIN (GLUCOPHAGE) 500 MG tablet Take one tab in am and 2 in pm with food  . Olopatadine HCl 0.2 % SOLN   . pantoprazole (PROTONIX) 40 MG tablet Take 1 tablet (40 mg total) by mouth daily.  . pioglitazone (ACTOS) 30 MG tablet Take 1 tablet (30 mg total) by mouth daily.  . propranolol  (INDERAL) 40 MG tablet Take 1 tablet (40 mg total) by mouth 2 (two) times daily. 1/2 tab twice daily.  Marland Kitchen SAPHRIS 10 MG SUBL   . TRAVATAN Z 0.004 % SOLN ophthalmic solution   . TRULICITY 1.5 CH/8.5ID SOPN inject 1.5 mg once weekly for diabetes  . [DISCONTINUED] AMITIZA 24 MCG capsule TAKE 1 CAPSULE BY MOUTH TWICE A DAY.  . [DISCONTINUED] metFORMIN (GLUCOPHAGE) 500 MG tablet Take 1 tablet (500 mg total) by mouth 2 (two) times daily.  . [DISCONTINUED] VESICARE 10 MG tablet    No facility-administered encounter medications on file as of 12/21/2017.     Surgical History: Past Surgical History:  Procedure Laterality Date  . APPENDECTOMY    . GASTRECTOMY    . VAGINAL HYSTERECTOMY      Medical History: Past Medical History:  Diagnosis Date  . Anxiety   . Constipation   . COPD (chronic obstructive pulmonary disease) ([place])   . Depression   . Diabetes ([place])   . GERD (gastroesophageal reflux disease)   . Hypertension     Family History: Family History  Problem Relation Age of Onset  . Hypertension Mother   . Atrial fibrillation Mother   . Hypertension Father   . Stomach cancer Father   . Diabetes Father     Social History   Socioeconomic History  . Marital status: Legally Separated    Spouse name: Not on file  . Number of children: Not on  file  . Years of education: Not on file  . Highest education level: Not on file  Social Needs  . Financial resource strain: Not on file  . Food insecurity - worry: Not on file  . Food insecurity - inability: Not on file  . Transportation needs - medical: Not on file  . Transportation needs - non-medical: Not on file  Occupational History  . Not on file  Tobacco Use  . Smoking status: Former Research scientist (life sciences)  . Smokeless tobacco: Current User  Substance and Sexual Activity  . Alcohol use: Not on file    Comment: occational  . Drug use: No  . Sexual activity: Not on file  Other Topics Concern  . Not on file  Social History Narrative  . Not  on file      Review of Systems  Constitutional: Negative for chills, fatigue and unexpected weight change.  HENT: Positive for postnasal drip. Negative for congestion, rhinorrhea, sneezing and sore throat.   Eyes: Negative for redness.  Respiratory: Negative for cough, chest tightness and shortness of breath.   Cardiovascular: Negative for chest pain and palpitations.  Gastrointestinal: Negative for abdominal pain, constipation, diarrhea, nausea and vomiting.  Genitourinary: Negative for dysuria and frequency.  Musculoskeletal: Negative for arthralgias, back pain, joint swelling and neck pain.  Skin: Negative for rash.  Neurological: Negative.  Negative for tremors and numbness.  Hematological: Negative for adenopathy. Does not bruise/bleed easily.  Psychiatric/Behavioral: Negative for behavioral problems (Depression), sleep disturbance and suicidal ideas. The patient is not nervous/anxious.     Vital Signs: BP 127/89 (BP Location: Left Arm, Patient Position: Sitting)   Pulse (!) 101   Resp 16   Ht 5\' 4"  (1.626 m)   Wt 207 lb 6.4 oz (94.1 kg)   SpO2 97%   BMI 35.60 kg/m    Physical Exam  Constitutional: She is oriented to person, place, and time. She appears well-developed and well-nourished. No distress.  HENT:  Head: Normocephalic and atraumatic.  Mouth/Throat: Oropharynx is clear and moist. No oropharyngeal exudate.  Eyes: EOM are normal. Pupils are equal, round, and reactive to light.  Neck: Normal range of motion. Neck supple. No JVD present. No tracheal deviation present. No thyromegaly present.  Cardiovascular: Normal rate, regular rhythm and normal heart sounds. Exam reveals no gallop and no friction rub.  No murmur heard. Pulmonary/Chest: Effort normal. No respiratory distress. She has no wheezes. She has no rales. She exhibits no tenderness.  Abdominal: Soft. Bowel sounds are normal.  Musculoskeletal: Normal range of motion.  Lymphadenopathy:    She has no  cervical adenopathy.  Neurological: She is alert and oriented to person, place, and time. No cranial nerve deficit.  Skin: Skin is warm and dry. She is not diaphoretic.  Psychiatric: She has a normal mood and affect. Her behavior is normal. Judgment and thought content normal.   Assessment/Plan: 1. Hypertension, unspecified type - Continue Norvasc  bisoprolol (ZEBETA) 5 MG tablet; Take 1 tablet (5 mg total) by mouth daily.  Dispense: 30 tablet; Refill: 6  2. Type 2 diabetes mellitus with hyperglycemia, unspecified whether long term insulin use (HCC) - POCT HgB A1C - metFORMIN (GLUCOPHAGE) 500 MG tablet; Take one tab in am and 2 in pm with food  Dispense: 90 tablet; Refill: 5  3. Gastroesophageal reflux disease without esophagitis - Continue meds as before   4. Constipation, unspecified constipation type - Improved on Amitza  5. Sleep disturbances - Home sleep test  General Counseling: Noreene Filbert  understanding of the findings of todays visit and agrees with plan of treatment. I have discussed any further diagnostic evaluation that may be needed or ordered today. We also reviewed her medications today. she has been encouraged to call the office with any questions or concerns that should arise related to todays visit.  Diabetes Counseling:  1. Addition of ACE inh/ ARB'S for nephroprotection. 2. Diabetic foot care, prevention of complications.  3.Exercise and lose weight.  4. Diabetic eye examination, 5. Monitor blood sugar closlely. nutrition counseling.  6.Sign and symptoms of hypoglycemia including shaking sweating,confusion and headaches.     Orders Placed This Encounter  Procedures  . POCT HgB A1C  . Home sleep test    Meds ordered this encounter  Medications  . bisoprolol (ZEBETA) 5 MG tablet    Sig: Take 1 tablet (5 mg total) by mouth daily.    Dispense:  30 tablet    Refill:  6  . metFORMIN (GLUCOPHAGE) 500 MG tablet    Sig: Take one tab in am and 2 in pm  with food    Dispense:  90 tablet    Refill:  5    Time spent:25 Minutes  Dr Lavera Guise Internal medicine

## 2017-12-22 LAB — POCT GLYCOSYLATED HEMOGLOBIN (HGB A1C): Hemoglobin A1C: 7.1

## 2018-01-28 ENCOUNTER — Other Ambulatory Visit: Payer: Self-pay

## 2018-01-28 MED ORDER — LINACLOTIDE 145 MCG PO CAPS: 145.0000 ug | ORAL_CAPSULE | Freq: Every day | ORAL | 3 refills | Status: DC

## 2018-01-28 NOTE — Telephone Encounter (Signed)
PHAR CALLED PT CONCERED ABOUT HER BP AS PER DR FOZIA KHAN ADVISED PHARMISTPT IS TAKING NORVASC AND BISOPROLOL AND D/C PROPRANOLOL

## 2018-03-01 ENCOUNTER — Other Ambulatory Visit: Payer: Self-pay

## 2018-03-01 MED ORDER — MELOXICAM 15 MG PO TABS: 15.0000 mg | ORAL_TABLET | Freq: Every day | ORAL | 1 refills | Status: DC

## 2018-03-25 ENCOUNTER — Other Ambulatory Visit: Payer: Self-pay | Admitting: Internal Medicine

## 2018-03-29 ENCOUNTER — Other Ambulatory Visit: Payer: Self-pay

## 2018-03-29 MED ORDER — PANTOPRAZOLE SODIUM 40 MG PO TBEC: 40.0000 mg | DELAYED_RELEASE_TABLET | Freq: Every day | ORAL | 3 refills | Status: DC

## 2018-04-19 ENCOUNTER — Ambulatory Visit: Payer: Self-pay | Admitting: Internal Medicine

## 2018-04-26 ENCOUNTER — Ambulatory Visit: Payer: Self-pay | Admitting: Internal Medicine

## 2018-04-26 ENCOUNTER — Other Ambulatory Visit: Payer: Self-pay | Admitting: Internal Medicine

## 2018-04-26 MED ORDER — FLUTICASONE FUROATE-VILANTEROL 200-25 MCG/INH IN AEPB: 1.0000 | INHALATION_SPRAY | Freq: Every day | RESPIRATORY_TRACT | 3 refills | Status: DC

## 2018-04-26 MED ORDER — MELOXICAM 15 MG PO TABS: 15.0000 mg | ORAL_TABLET | Freq: Every day | ORAL | 1 refills | Status: DC

## 2018-05-16 ENCOUNTER — Other Ambulatory Visit: Payer: Self-pay | Admitting: Internal Medicine

## 2018-05-18 ENCOUNTER — Ambulatory Visit: Payer: Self-pay | Admitting: Adult Health

## 2018-05-23 ENCOUNTER — Other Ambulatory Visit: Payer: Self-pay | Admitting: Internal Medicine

## 2018-05-26 ENCOUNTER — Encounter: Payer: Self-pay | Admitting: Adult Health

## 2018-05-26 ENCOUNTER — Other Ambulatory Visit: Payer: Self-pay | Admitting: Internal Medicine

## 2018-05-26 ENCOUNTER — Ambulatory Visit (INDEPENDENT_AMBULATORY_CARE_PROVIDER_SITE_OTHER): Payer: Medicare HMO | Admitting: Adult Health

## 2018-05-26 VITALS — BP 152/96 | HR 98 | Resp 16 | Ht 64.0 in | Wt 228.0 lb

## 2018-05-26 DIAGNOSIS — E1165 Type 2 diabetes mellitus with hyperglycemia: Secondary | ICD-10-CM | POA: Diagnosis not present

## 2018-05-26 DIAGNOSIS — F329 Major depressive disorder, single episode, unspecified: Secondary | ICD-10-CM | POA: Diagnosis not present

## 2018-05-26 DIAGNOSIS — F32A Depression, unspecified: Secondary | ICD-10-CM

## 2018-05-26 DIAGNOSIS — Z1211 Encounter for screening for malignant neoplasm of colon: Secondary | ICD-10-CM

## 2018-05-26 DIAGNOSIS — K219 Gastro-esophageal reflux disease without esophagitis: Secondary | ICD-10-CM

## 2018-05-26 DIAGNOSIS — Z1239 Encounter for other screening for malignant neoplasm of breast: Secondary | ICD-10-CM

## 2018-05-26 DIAGNOSIS — Z1231 Encounter for screening mammogram for malignant neoplasm of breast: Secondary | ICD-10-CM

## 2018-05-26 DIAGNOSIS — I10 Essential (primary) hypertension: Secondary | ICD-10-CM | POA: Diagnosis not present

## 2018-05-26 LAB — POCT GLYCOSYLATED HEMOGLOBIN (HGB A1C): Hemoglobin A1C: 7.1 % — AB (ref 4.0–5.6)

## 2018-05-26 MED ORDER — PNEUMOCOCCAL 13-VAL CONJ VACC IM SUSP: 0.5000 mL | INTRAMUSCULAR | 0 refills | Status: AC

## 2018-05-26 MED ORDER — ROSUVASTATIN CALCIUM 10 MG PO TABS: 10.0000 mg | ORAL_TABLET | Freq: Every day | ORAL | 3 refills | Status: DC

## 2018-05-26 NOTE — Progress Notes (Signed)
Pt is needing pap smear, colonoscopy and mammogram , she states that she is going to eye doctor next week . Prevnar was ordered.

## 2018-05-26 NOTE — Progress Notes (Signed)
[facility] [place],  [address]  Internal MEDICINE  Office Visit Note  Patient Name: Rachel Nielsen  [id number]  [id number]  Date of Service: 05/26/2018  Chief Complaint  Patient presents with  . Diabetes  . Hypertension  . COPD  . Gastroesophageal Reflux    Diabetes  She presents for her follow-up diabetic visit. She has type 2 diabetes mellitus. Her disease course has been stable. There are no hypoglycemic associated symptoms. Pertinent negatives for hypoglycemia include no nervousness/anxiousness or tremors. Associated symptoms include blurred vision. Pertinent negatives for diabetes include no chest pain, no fatigue, no foot ulcerations, no visual change, no weakness and no weight loss. There are no hypoglycemic complications. Symptoms are stable.  Hypertension  This is a recurrent problem. The current episode started more than 1 year ago. The problem is unchanged. Associated symptoms include blurred vision. Pertinent negatives include no chest pain, neck pain, palpitations or shortness of breath.  COPD  There is no chest tightness, cough, difficulty breathing, hemoptysis, hoarse voice or shortness of breath. This is a chronic problem. The current episode started more than 1 year ago. Pertinent negatives include no chest pain, heartburn, rhinorrhea, sneezing, sore throat or weight loss. Her past medical history is significant for COPD.  Gastroesophageal Reflux  She reports no abdominal pain, no chest pain, no coughing, no heartburn, no hoarse voice, no nausea or no sore throat. This is a chronic problem. The current episode started more than 1 year ago. The problem occurs occasionally. The symptoms are aggravated by certain foods. Pertinent negatives include no fatigue or weight loss.      Current Medication: Outpatient Encounter Medications as of 05/26/2018  Medication Sig  . ALPRAZolam (XANAX) 0.5 MG tablet   . amLODipine (NORVASC) 5 MG tablet  Take 1 tablet (5 mg total) by mouth daily.  . bisoprolol (ZEBETA) 5 MG tablet Take 1 tablet (5 mg total) by mouth daily.  Marland Kitchen buPROPion (WELLBUTRIN XL) 300 MG 24 hr tablet   . busPIRone (BUSPAR) 15 MG tablet   . DULoxetine (CYMBALTA) 60 MG capsule   . fluticasone furoate-vilanterol (BREO ELLIPTA) 200-25 MCG/INH AEPB Inhale 1 puff into the lungs daily.  Marland Kitchen gabapentin (NEURONTIN) 300 MG capsule Take 1 capsule (300 mg total) by mouth 2 (two) times daily.  Marland Kitchen LATUDA 40 MG TABS tablet   . LINZESS 145 MCG CAPS capsule Take 1 capsule (145 mcg total) by mouth daily.  . meloxicam (MOBIC) 15 MG tablet Take 1 tablet (15 mg total) by mouth at bedtime.  . metFORMIN (GLUCOPHAGE) 500 MG tablet Take one tab in am and 2 in pm with food  . Olopatadine HCl 0.2 % SOLN   . pantoprazole (PROTONIX) 40 MG tablet Take 1 tablet (40 mg total) by mouth daily.  . pioglitazone (ACTOS) 30 MG tablet Take 1 tablet (30 mg total) by mouth daily.  Marland Kitchen SAPHRIS 10 MG SUBL   . TRAVATAN Z 0.004 % SOLN ophthalmic solution   . TRULICITY 1.5 GG/2.6RS SOPN inject 1.5 mg once weekly for diabetes   No facility-administered encounter medications on file as of 05/26/2018.     Surgical History: Past Surgical History:  Procedure Laterality Date  . APPENDECTOMY    . GASTRECTOMY    . VAGINAL HYSTERECTOMY      Medical History: Past Medical History:  Diagnosis Date  . Anxiety   . Constipation   . COPD (chronic obstructive pulmonary disease) ([place])   . Depression   . Diabetes (  HCC)   . GERD (gastroesophageal reflux disease)   . Hypertension     Family History: Family History  Problem Relation Age of Onset  . Hypertension Mother   . Atrial fibrillation Mother   . Hypertension Father   . Stomach cancer Father   . Diabetes Father     Social History   Socioeconomic History  . Marital status: Legally Separated    Spouse name: Not on file  . Number of children: Not on file  . Years of education: Not on file  . Highest  education level: Not on file  Occupational History  . Not on file  Social Needs  . Financial resource strain: Not on file  . Food insecurity:    Worry: Not on file    Inability: Not on file  . Transportation needs:    Medical: Not on file    Non-medical: Not on file  Tobacco Use  . Smoking status: Former Research scientist (life sciences)  . Smokeless tobacco: Current User  Substance and Sexual Activity  . Alcohol use: Yes    Comment: occational  . Drug use: No  . Sexual activity: Not on file  Lifestyle  . Physical activity:    Days per week: Not on file    Minutes per session: Not on file  . Stress: Not on file  Relationships  . Social connections:    Talks on phone: Not on file    Gets together: Not on file    Attends religious service: Not on file    Active member of club or organization: Not on file    Attends meetings of clubs or organizations: Not on file    Relationship status: Not on file  . Intimate partner violence:    Fear of current or ex partner: Not on file    Emotionally abused: Not on file    Physically abused: Not on file    Forced sexual activity: Not on file  Other Topics Concern  . Not on file  Social History Narrative  . Not on file      Review of Systems  Constitutional: Negative for chills, fatigue, unexpected weight change and weight loss.  HENT: Negative for congestion, hoarse voice, rhinorrhea, sneezing and sore throat.   Eyes: Positive for blurred vision. Negative for photophobia, pain and redness.  Respiratory: Negative for cough, hemoptysis, chest tightness and shortness of breath.   Cardiovascular: Negative for chest pain and palpitations.  Gastrointestinal: Negative for abdominal pain, constipation, diarrhea, heartburn, nausea and vomiting.  Endocrine: Negative.   Genitourinary: Negative for dysuria and frequency.  Musculoskeletal: Negative for arthralgias, back pain, joint swelling and neck pain.  Skin: Negative for rash.  Allergic/Immunologic: Negative.    Neurological: Negative for tremors, weakness and numbness.  Hematological: Negative for adenopathy. Does not bruise/bleed easily.  Psychiatric/Behavioral: Negative for behavioral problems and sleep disturbance. The patient is not nervous/anxious.     Vital Signs: BP (!) 152/96   Pulse 98   Resp 16   Ht 5\' 4"  (1.626 m)   Wt 228 lb (103.4 kg)   SpO2 96%   BMI 39.14 kg/m    Physical Exam  Constitutional: She is oriented to person, place, and time. She appears well-developed and well-nourished. No distress.  HENT:  Head: Normocephalic and atraumatic.  Mouth/Throat: Oropharynx is clear and moist. No oropharyngeal exudate.  Eyes: Pupils are equal, round, and reactive to light. EOM are normal.  Neck: Normal range of motion. Neck supple. No JVD present. No tracheal deviation  present. No thyromegaly present.  Cardiovascular: Normal rate, regular rhythm and normal heart sounds. Exam reveals no gallop and no friction rub.  No murmur heard. Pulmonary/Chest: Effort normal and breath sounds normal. No respiratory distress. She has no wheezes. She has no rales. She exhibits no tenderness.  Abdominal: Soft. There is no tenderness. There is no guarding.  Musculoskeletal: Normal range of motion.  Lymphadenopathy:    She has no cervical adenopathy.  Neurological: She is alert and oriented to person, place, and time. No cranial nerve deficit.  Skin: Skin is warm and dry. She is not diaphoretic.  Psychiatric: She has a normal mood and affect. Her behavior is normal. Judgment and thought content normal.  Nursing note and vitals reviewed.    Assessment/Plan: 1. Uncontrolled type 2 diabetes mellitus with hyperglycemia (HCC) Pt not taking trulicity regularly.  Encouraged compliance.   - Microalbumin, urine - POCT HgB A1C - rosuvastatin (CRESTOR) 10 MG tablet; Take 1 tablet (10 mg total) by mouth daily.  Dispense: 90 tablet; Refill: 3 2. Hypertension, unspecified type Hypertensive today.  Suspect  med compliance issues. Encouraged to continue medications.   3. Depression, unspecified depression type Denotes depression lately.  Followed by psych.    4. Gastroesophageal reflux disease without esophagitis Taking omeprazole, continue regeimen.  Stable at this time.   5. Morbid obesity ([place]) Obesity Counseling: Risk Assessment: An assessment of behavioral risk factors was made today and includes lack of exercise sedentary lifestyle, lack of portion control and poor dietary habits.  Risk Modification Advice: She was counseled on portion control guidelines. Restricting daily caloric intake to. . The detrimental long term effects of obesity on her health and ongoing poor compliance was also discussed with the patient.  6. Screening for breast cancer  - MM DIGITAL SCREENING BILATERAL; Future  7. Screen for colon cancer  - Ambulatory referral to Gastroenterology  General Counseling: karren newland understanding of the findings of todays visit and agrees with plan of treatment. I have discussed any further diagnostic evaluation that may be needed or ordered today. We also reviewed her medications today. she has been encouraged to call the office with any questions or concerns that should arise related to todays visit.    Orders Placed This Encounter  Procedures  . Microalbumin, urine  . POCT HgB A1C    No orders of the defined types were placed in this encounter.   Time spent: 25 Minutes   This patient was seen by Orson Gear AGNP-C in Collaboration with Dr Lavera Guise as a part of collaborative care agreement    Dr Lavera Guise Internal medicine

## 2018-05-26 NOTE — Patient Instructions (Signed)
Diabetes Mellitus and Nutrition When you have diabetes (diabetes mellitus), it is very important to have healthy eating habits because your blood sugar (glucose) levels are greatly affected by what you eat and drink. Eating healthy foods in the appropriate amounts, at about the same times every day, can help you:  Control your blood glucose.  Lower your risk of heart disease.  Improve your blood pressure.  Reach or maintain a healthy weight.  Every person with diabetes is different, and each person has different needs for a meal plan. Your health care provider may recommend that you work with a diet and nutrition specialist (dietitian) to make a meal plan that is best for you. Your meal plan may vary depending on factors such as:  The calories you need.  The medicines you take.  Your weight.  Your blood glucose, blood pressure, and cholesterol levels.  Your activity level.  Other health conditions you have, such as heart or kidney disease.  How do carbohydrates affect me? Carbohydrates affect your blood glucose level more than any other type of food. Eating carbohydrates naturally increases the amount of glucose in your blood. Carbohydrate counting is a method for keeping track of how many carbohydrates you eat. Counting carbohydrates is important to keep your blood glucose at a healthy level, especially if you use insulin or take certain oral diabetes medicines. It is important to know how many carbohydrates you can safely have in each meal. This is different for every person. Your dietitian can help you calculate how many carbohydrates you should have at each meal and for snack. Foods that contain carbohydrates include:  Bread, cereal, rice, pasta, and crackers.  Potatoes and corn.  Peas, beans, and lentils.  Milk and yogurt.  Fruit and juice.  Desserts, such as cakes, cookies, ice cream, and candy.  How does alcohol affect me? Alcohol can cause a sudden decrease in blood  glucose (hypoglycemia), especially if you use insulin or take certain oral diabetes medicines. Hypoglycemia can be a life-threatening condition. Symptoms of hypoglycemia (sleepiness, dizziness, and confusion) are similar to symptoms of having too much alcohol. If your health care provider says that alcohol is safe for you, follow these guidelines:  Limit alcohol intake to no more than 1 drink per day for nonpregnant women and 2 drinks per day for men. One drink equals 12 oz of beer, 5 oz of wine, or 1 oz of hard liquor.  Do not drink on an empty stomach.  Keep yourself hydrated with water, diet soda, or unsweetened iced tea.  Keep in mind that regular soda, juice, and other mixers may contain a lot of sugar and must be counted as carbohydrates.  What are tips for following this plan? Reading food labels  Start by checking the serving size on the label. The amount of calories, carbohydrates, fats, and other nutrients listed on the label are based on one serving of the food. Many foods contain more than one serving per package.  Check the total grams (g) of carbohydrates in one serving. You can calculate the number of servings of carbohydrates in one serving by dividing the total carbohydrates by 15. For example, if a food has 30 g of total carbohydrates, it would be equal to 2 servings of carbohydrates.  Check the number of grams (g) of saturated and trans fats in one serving. Choose foods that have low or no amount of these fats.  Check the number of milligrams (mg) of sodium in one serving. Most people   should limit total sodium intake to less than 2,300 mg per day.  Always check the nutrition information of foods labeled as "low-fat" or "nonfat". These foods may be higher in added sugar or refined carbohydrates and should be avoided.  Talk to your dietitian to identify your daily goals for nutrients listed on the label. Shopping  Avoid buying canned, premade, or processed foods. These  foods tend to be high in fat, sodium, and added sugar.  Shop around the outside edge of the grocery store. This includes fresh fruits and vegetables, bulk grains, fresh meats, and fresh dairy. Cooking  Use low-heat cooking methods, such as baking, instead of high-heat cooking methods like deep frying.  Cook using healthy oils, such as olive, canola, or sunflower oil.  Avoid cooking with butter, cream, or high-fat meats. Meal planning  Eat meals and snacks regularly, preferably at the same times every day. Avoid going long periods of time without eating.  Eat foods high in fiber, such as fresh fruits, vegetables, beans, and whole grains. Talk to your dietitian about how many servings of carbohydrates you can eat at each meal.  Eat 4-6 ounces of lean protein each day, such as lean meat, chicken, fish, eggs, or tofu. 1 ounce is equal to 1 ounce of meat, chicken, or fish, 1 egg, or 1/4 cup of tofu.  Eat some foods each day that contain healthy fats, such as avocado, nuts, seeds, and fish. Lifestyle   Check your blood glucose regularly.  Exercise at least 30 minutes 5 or more days each week, or as told by your health care provider.  Take medicines as told by your health care provider.  Do not use any products that contain nicotine or tobacco, such as cigarettes and e-cigarettes. If you need help quitting, ask your health care provider.  Work with a counselor or diabetes educator to identify strategies to manage stress and any emotional and social challenges. What are some questions to ask my health care provider?  Do I need to meet with a diabetes educator?  Do I need to meet with a dietitian?  What number can I call if I have questions?  When are the best times to check my blood glucose? Where to find more information:  American Diabetes Association: diabetes.org/food-and-fitness/food  Academy of Nutrition and Dietetics:  www.eatright.org/resources/health/diseases-and-conditions/diabetes  National Institute of Diabetes and Digestive and Kidney Diseases (NIH): www.niddk.nih.gov/health-information/diabetes/overview/diet-eating-physical-activity Summary  A healthy meal plan will help you control your blood glucose and maintain a healthy lifestyle.  Working with a diet and nutrition specialist (dietitian) can help you make a meal plan that is best for you.  Keep in mind that carbohydrates and alcohol have immediate effects on your blood glucose levels. It is important to count carbohydrates and to use alcohol carefully. This information is not intended to replace advice given to you by your health care provider. Make sure you discuss any questions you have with your health care provider. Document Released: 07/16/2005 Document Revised: 11/23/2016 Document Reviewed: 11/23/2016 Elsevier Interactive Patient Education  2018 Elsevier Inc.  

## 2018-05-27 LAB — MICROALBUMIN, URINE: Microalbumin, Urine: 7.8 ug/mL

## 2018-06-06 ENCOUNTER — Encounter: Payer: Self-pay | Admitting: Adult Health

## 2018-06-10 ENCOUNTER — Other Ambulatory Visit: Payer: Self-pay | Admitting: Internal Medicine

## 2018-06-20 ENCOUNTER — Other Ambulatory Visit: Payer: Self-pay | Admitting: Internal Medicine

## 2018-06-20 MED ORDER — GABAPENTIN 300 MG PO CAPS: 300.0000 mg | ORAL_CAPSULE | Freq: Two times a day (BID) | ORAL | 5 refills | Status: DC

## 2018-06-20 MED ORDER — PIOGLITAZONE HCL 30 MG PO TABS: 30.0000 mg | ORAL_TABLET | Freq: Every day | ORAL | 5 refills | Status: DC

## 2018-06-20 MED ORDER — AMLODIPINE BESYLATE 5 MG PO TABS: 5.0000 mg | ORAL_TABLET | Freq: Every day | ORAL | 5 refills | Status: DC

## 2018-07-19 ENCOUNTER — Encounter: Payer: Self-pay | Admitting: *Deleted

## 2018-07-20 ENCOUNTER — Encounter
Admission: RE | Disposition: A | Discharge status: Home / Self Care | Payer: Self-pay | Source: Ambulatory Visit | Attending: Internal Medicine

## 2018-07-20 ENCOUNTER — Encounter: Payer: Self-pay | Admitting: Internal Medicine

## 2018-07-20 ENCOUNTER — Ambulatory Visit
Admission: RE | Admit: 2018-07-20 | Discharge: 2018-07-20 | Disposition: A | Discharge status: Home / Self Care | Payer: Medicare HMO | Source: Ambulatory Visit | Attending: Internal Medicine | Admitting: Internal Medicine

## 2018-07-20 ENCOUNTER — Ambulatory Visit: Payer: Medicare HMO | Admitting: Anesthesiology

## 2018-07-20 DIAGNOSIS — I10 Essential (primary) hypertension: Secondary | ICD-10-CM | POA: Diagnosis not present

## 2018-07-20 DIAGNOSIS — F329 Major depressive disorder, single episode, unspecified: Secondary | ICD-10-CM | POA: Insufficient documentation

## 2018-07-20 DIAGNOSIS — E119 Type 2 diabetes mellitus without complications: Secondary | ICD-10-CM | POA: Diagnosis not present

## 2018-07-20 DIAGNOSIS — K2970 Gastritis, unspecified, without bleeding: Secondary | ICD-10-CM | POA: Diagnosis not present

## 2018-07-20 DIAGNOSIS — Z79899 Other long term (current) drug therapy: Secondary | ICD-10-CM | POA: Diagnosis not present

## 2018-07-20 DIAGNOSIS — Z1211 Encounter for screening for malignant neoplasm of colon: Secondary | ICD-10-CM | POA: Insufficient documentation

## 2018-07-20 DIAGNOSIS — J449 Chronic obstructive pulmonary disease, unspecified: Secondary | ICD-10-CM | POA: Diagnosis not present

## 2018-07-20 DIAGNOSIS — K640 First degree hemorrhoids: Secondary | ICD-10-CM | POA: Insufficient documentation

## 2018-07-20 DIAGNOSIS — G4730 Sleep apnea, unspecified: Secondary | ICD-10-CM | POA: Diagnosis not present

## 2018-07-20 DIAGNOSIS — R1314 Dysphagia, pharyngoesophageal phase: Secondary | ICD-10-CM | POA: Insufficient documentation

## 2018-07-20 DIAGNOSIS — K449 Diaphragmatic hernia without obstruction or gangrene: Secondary | ICD-10-CM | POA: Insufficient documentation

## 2018-07-20 DIAGNOSIS — Z800 Family history of malignant neoplasm of digestive organs: Secondary | ICD-10-CM | POA: Insufficient documentation

## 2018-07-20 DIAGNOSIS — K228 Other specified diseases of esophagus: Secondary | ICD-10-CM | POA: Insufficient documentation

## 2018-07-20 DIAGNOSIS — Z87891 Personal history of nicotine dependence: Secondary | ICD-10-CM | POA: Diagnosis not present

## 2018-07-20 DIAGNOSIS — Z86010 Personal history of colonic polyps: Secondary | ICD-10-CM | POA: Insufficient documentation

## 2018-07-20 DIAGNOSIS — K210 Gastro-esophageal reflux disease with esophagitis: Secondary | ICD-10-CM | POA: Insufficient documentation

## 2018-07-20 DIAGNOSIS — K5909 Other constipation: Secondary | ICD-10-CM | POA: Diagnosis not present

## 2018-07-20 DIAGNOSIS — F419 Anxiety disorder, unspecified: Secondary | ICD-10-CM | POA: Insufficient documentation

## 2018-07-20 DIAGNOSIS — Z7984 Long term (current) use of oral hypoglycemic drugs: Secondary | ICD-10-CM | POA: Insufficient documentation

## 2018-07-20 HISTORY — PX: ESOPHAGOGASTRODUODENOSCOPY: SHX5428

## 2018-07-20 HISTORY — PX: COLONOSCOPY WITH PROPOFOL: SHX5780

## 2018-07-20 HISTORY — DX: Sleep apnea, unspecified: G47.30

## 2018-07-20 HISTORY — DX: Unspecified asthma, uncomplicated: J45.909

## 2018-07-20 LAB — GLUCOSE, CAPILLARY: Glucose-Capillary: 123 mg/dL — ABNORMAL HIGH (ref 70–99)

## 2018-07-20 SURGERY — EGD (ESOPHAGOGASTRODUODENOSCOPY)
Anesthesia: General

## 2018-07-20 MED ORDER — PROPOFOL 10 MG/ML IV BOLUS
INTRAVENOUS | Status: AC
  Filled 2018-07-20: qty 20

## 2018-07-20 MED ORDER — LIDOCAINE HCL (CARDIAC) PF 100 MG/5ML IV SOSY
PREFILLED_SYRINGE | INTRAVENOUS | Status: DC | PRN
  Administered 2018-07-20: 100 mg via INTRAVENOUS

## 2018-07-20 MED ORDER — LIDOCAINE HCL (PF) 2 % IJ SOLN
INTRAMUSCULAR | Status: AC
  Filled 2018-07-20: qty 10

## 2018-07-20 MED ORDER — PROPOFOL 10 MG/ML IV BOLUS
INTRAVENOUS | Status: DC | PRN
  Administered 2018-07-20: 80 mg via INTRAVENOUS

## 2018-07-20 MED ORDER — LIDOCAINE HCL (PF) 1 % IJ SOLN
2.0000 mL | Freq: Once | INTRAMUSCULAR | Status: AC
  Administered 2018-07-20: 0.3 mL via INTRADERMAL

## 2018-07-20 MED ORDER — LIDOCAINE HCL (PF) 1 % IJ SOLN
INTRAMUSCULAR | Status: AC
  Administered 2018-07-20: 0.3 mL via INTRADERMAL
  Filled 2018-07-20: qty 2

## 2018-07-20 MED ORDER — PROPOFOL 500 MG/50ML IV EMUL
INTRAVENOUS | Status: AC
  Filled 2018-07-20: qty 50

## 2018-07-20 MED ORDER — SODIUM CHLORIDE 0.9 % IV SOLN
INTRAVENOUS | Status: DC
  Administered 2018-07-20: 1000 mL via INTRAVENOUS

## 2018-07-20 MED ORDER — PROPOFOL 500 MG/50ML IV EMUL
INTRAVENOUS | Status: DC | PRN
  Administered 2018-07-20: 125 ug/kg/min via INTRAVENOUS

## 2018-07-20 NOTE — Op Note (Signed)
[facility] Gastroenterology Patient Name: Rachel Nielsen Procedure Date: 07/20/2018 9:50 AM MRN: [id number] Account #: [id number] Date of Birth: Apr 16, 1951 Admit Type: Outpatient Age: 67 Room: [facility] ENDO ROOM 4 Gender: Female Note Status: Finalized Procedure:            Upper GI endoscopy Indications:          Dysphagia, Suspected esophageal reflux Providers:            Benay Pike. Alice Reichert MD, MD Referring MD:         Lavera Guise, MD (Referring MD) Medicines:            Propofol per Anesthesia Complications:        No immediate complications. Procedure:            Pre-Anesthesia Assessment:                       - The risks and benefits of the procedure and the                        sedation options and risks were discussed with the                        patient. All questions were answered and informed                        consent was obtained.                       - Patient identification and proposed procedure were                        verified prior to the procedure by the nurse. The                        procedure was verified in the procedure room.                       - ASA Grade Assessment: III - A patient with severe                        systemic disease.                       - After reviewing the risks and benefits, the patient                        was deemed in satisfactory condition to undergo the                        procedure.                       After obtaining informed consent, the endoscope was                        passed under direct vision. Throughout the procedure,                        the patient's blood pressure, pulse, and oxygen  saturations were monitored continuously. The Endoscope                        was introduced through the mouth, and advanced to the                        third part of duodenum. The upper GI endoscopy was                        accomplished without difficulty. The patient  tolerated                        the procedure well. Findings:      The Z-line was irregular and was found at the gastroesophageal junction.       Mucosa was biopsied with a cold forceps for histology. One specimen       bottle was sent to pathology.      No endoscopic abnormality was evident in the esophagus to explain the       patient's complaint of dysphagia. It was decided, however, to proceed       with dilation in the distal esophagus. The scope was withdrawn. Dilation       was performed with a Maloney dilator with no resistance at 79 Fr.      Segmental moderate inflammation characterized by erythema was found in       the gastric body and in the gastric antrum.      The examined duodenum was normal.      A 1 cm hiatal hernia was present. Impression:           - Z-line irregular, at the gastroesophageal junction.                        Biopsied.                       - No endoscopic esophageal abnormality to explain                        patient's dysphagia. Esophagus dilated. Dilated.                       - Gastritis.                       - Normal examined duodenum. Recommendation:       - Await pathology results.                       - Proceed with colonoscopy Procedure Code(s):    --- Professional ---                       [id number], Esophagogastroduodenoscopy, flexible, transoral;                        with biopsy, single or multiple                       43450, Dilation of esophagus, by unguided sound or                        bougie, single or multiple passes Diagnosis Code(s):    ---  Professional ---                       K29.70, Gastritis, unspecified, without bleeding                       R13.10, Dysphagia, unspecified                       K22.8, Other specified diseases of esophagus CPT copyright 2017 American Medical Association. All rights reserved. The codes documented in this report are preliminary and upon coder review may  be revised to meet current  compliance requirements. Efrain Sella MD, MD 07/20/2018 10:12:52 AM This report has been signed electronically. Number of Addenda: 0 Note Initiated On: 07/20/2018 9:50 AM      [facility]

## 2018-07-20 NOTE — Op Note (Signed)
[facility] Gastroenterology Patient Name: Rachel Nielsen Procedure Date: 07/20/2018 9:49 AM MRN: [id number] Account #: [id number] Date of Birth: Aug 12, 1951 Admit Type: Outpatient Age: 67 Room: [facility] ENDO ROOM 4 Gender: Female Note Status: Finalized Procedure:            Colonoscopy Indications:          High risk colon cancer surveillance: Personal history                        of colonic polyps Providers:            Benay Pike. Alice Reichert MD, MD Referring MD:         Lavera Guise, MD (Referring MD) Medicines:            Propofol per Anesthesia Complications:        No immediate complications. Procedure:            Pre-Anesthesia Assessment:                       - The risks and benefits of the procedure and the                        sedation options and risks were discussed with the                        patient. All questions were answered and informed                        consent was obtained.                       - Patient identification and proposed procedure were                        verified prior to the procedure by the nurse. The                        procedure was verified in the procedure room.                       - ASA Grade Assessment: III - A patient with severe                        systemic disease.                       - After reviewing the risks and benefits, the patient                        was deemed in satisfactory condition to undergo the                        procedure.                       After obtaining informed consent, the colonoscope was                        passed under direct vision. Throughout the procedure,  the patient's blood pressure, pulse, and oxygen                        saturations were monitored continuously. The                        Colonoscope was introduced through the anus and                        advanced to the the cecum. The colonoscopy was                        performed  without difficulty. The patient tolerated the                        procedure well. The quality of the bowel preparation                        was fair. Findings:      The perianal and digital rectal examinations were normal. Pertinent       negatives include normal sphincter tone and no palpable rectal lesions.      [Description] stool was found in the cecum [Visualization].      Copious quantities of semi-liquid stool was found in the sigmoid colon,       in the descending colon and in the cecum, making visualization       difficult. Lavage of the area was performed using 50 - 200 mL of sterile       water, resulting in incomplete clearance with fair visualization.      Non-bleeding internal hemorrhoids were found during retroflexion. The       hemorrhoids were Grade I (internal hemorrhoids that do not prolapse).      The exam was otherwise without abnormality. Impression:           - Preparation of the colon was fair.                       - Stool in the cecum.                       - Stool in the sigmoid colon, in the descending colon                        and in the cecum.                       - Non-bleeding internal hemorrhoids.                       - The examination was otherwise normal.                       - No specimens collected. Recommendation:       - Patient has a contact number available for                        emergencies. The signs and symptoms of potential                        delayed complications were discussed with the patient.  Return to normal activities tomorrow. Written discharge                        instructions were provided to the patient.                       - Await pathology results from EGD, also performed                        today.                       - Resume previous diet.                       - Continue present medications.                       - Repeat colonoscopy in 1 year for adenoma surveillance.                        - Return to GI office in 3 months. Procedure Code(s):    --- Professional ---                       Z6629, Colorectal cancer screening; colonoscopy on                        individual at high risk Diagnosis Code(s):    --- Professional ---                       K64.0, First degree hemorrhoids                       Z86.010, Personal history of colonic polyps CPT copyright 2017 American Medical Association. All rights reserved. The codes documented in this report are preliminary and upon coder review may  be revised to meet current compliance requirements. Efrain Sella MD, MD 07/20/2018 10:30:49 AM This report has been signed electronically. Number of Addenda: 0 Note Initiated On: 07/20/2018 9:49 AM Scope Withdrawal Time: 0 hours 5 minutes 28 seconds  Total Procedure Duration: 0 hours 10 minutes 27 seconds       [facility]

## 2018-07-20 NOTE — Anesthesia Preprocedure Evaluation (Signed)
Anesthesia Evaluation  Patient identified by MRN, date of birth, ID band Patient awake    Reviewed: Allergy & Precautions, H&P , NPO status , reviewed documented beta blocker date and time   Airway Mallampati: II  TM Distance: >3 FB Neck ROM: full    Dental  (+) Chipped, Missing Large gap upper incisors:   Pulmonary asthma , sleep apnea , COPD, former smoker,  Asthma controlled   Pulmonary exam normal        Cardiovascular hypertension, Normal cardiovascular exam     Neuro/Psych PSYCHIATRIC DISORDERS Anxiety Depression    GI/Hepatic GERD  ,  Endo/Other  diabetesMorbid obesity  Renal/GU      Musculoskeletal   Abdominal   Peds  Hematology   Anesthesia Other Findings Past Medical History: No date: Anxiety No date: Asthma No date: Constipation No date: COPD (chronic obstructive pulmonary disease) (HCC) No date: Depression No date: Diabetes (HCC) No date: GERD (gastroesophageal reflux disease) No date: Hypertension No date: Sleep apnea  Past Surgical History: No date: APPENDECTOMY No date: CHOLECYSTECTOMY No date: GASTRECTOMY No date: TUBAL LIGATION No date: VAGINAL HYSTERECTOMY  BMI    Body Mass Index:  39.14 kg/m      Reproductive/Obstetrics                             Anesthesia Physical Anesthesia Plan  ASA: III  Anesthesia Plan: General   Post-op Pain Management:    Induction: Intravenous  PONV Risk Score and Plan: 3 and Treatment may vary due to age or medical condition and TIVA  Airway Management Planned: Nasal Cannula and Natural Airway  Additional Equipment:   Intra-op Plan:   Post-operative Plan:   Informed Consent: I have reviewed the patients History and Physical, chart, labs and discussed the procedure including the risks, benefits and alternatives for the proposed anesthesia with the patient or authorized representative who has indicated his/her  understanding and acceptance.   Dental Advisory Given  Plan Discussed with:   Anesthesia Plan Comments:         Anesthesia Quick Evaluation

## 2018-07-20 NOTE — Anesthesia Post-op Follow-up Note (Signed)
Anesthesia QCDR form completed.        

## 2018-07-20 NOTE — Transfer of Care (Signed)
Immediate Anesthesia Transfer of Care Note  Patient: LEVON PENNING  Procedure(s) Performed: ESOPHAGOGASTRODUODENOSCOPY (EGD) (N/A ) COLONOSCOPY WITH PROPOFOL (N/A )  Patient Location: PACU and Endoscopy Unit  Anesthesia Type:General  Level of Consciousness: alert   Airway & Oxygen Therapy: Patient Spontanous Breathing  Post-op Assessment: Report given to RN  Post vital signs: Reviewed  Last Vitals:  Vitals Value Taken Time  BP 146/111 07/20/2018 10:32 AM  Temp 35.9 C 07/20/2018 10:30 AM  Pulse 91 07/20/2018 10:33 AM  Resp 15 07/20/2018 10:33 AM  SpO2 99 % 07/20/2018 10:33 AM  Vitals shown include unvalidated device data.  Last Pain:  Vitals:   07/20/18 0923  TempSrc: Tympanic  PainSc: 0-No pain         Complications: No apparent anesthesia complications

## 2018-07-20 NOTE — H&P (Signed)
Outpatient short stay form Pre-procedure 07/20/2018 9:30 AM Rachel Nielsen K. Rachel Nielsen, M.D.  Primary Physician: Clayborn Bigness, MD  Reason for visit: Esophageal dysphagia, GERD, personal history of colon polyps  History of present illness: Pleasant 67 year old female presents for the above problems.  Patient has intermittent complaints of solid food dysphagia near the lower sternum.  No nausea vomiting odynophagia or early satiety or weight loss.  Patient also has a personal history of tubular adenomatous polyp in 2014 at the time of the last colonoscopy.  Father had a history of colon cancer.  Patient suffers from chronic constipation managed with Linzess.    Current Facility-Administered Medications:  .  0.9 %  sodium chloride infusion, , Intravenous, Continuous, Taelor Waymire, Hunter K, MD .  lidocaine (PF) (XYLOCAINE) 1 % injection 2 mL, 2 mL, Intradermal, Once, [place], Benay Pike, MD  Medications Prior to Admission  Medication Sig Dispense Refill Last Dose  . ranitidine (ZANTAC) 150 MG tablet Take 150 mg by mouth as needed for heartburn.     . ALPRAZolam (XANAX) 0.5 MG tablet    Taking  . amLODipine (NORVASC) 5 MG tablet Take 1 tablet (5 mg total) by mouth daily. 30 tablet 5   . bisoprolol (ZEBETA) 5 MG tablet Take 1 tablet (5 mg total) by mouth daily. 30 tablet 6 Taking  . buPROPion (WELLBUTRIN XL) 300 MG 24 hr tablet    Taking  . busPIRone (BUSPAR) 15 MG tablet    Taking  . DULoxetine (CYMBALTA) 60 MG capsule    Taking  . fluticasone furoate-vilanterol (BREO ELLIPTA) 200-25 MCG/INH AEPB Inhale 1 puff into the lungs daily. 60 each 3 Taking  . gabapentin (NEURONTIN) 300 MG capsule Take 1 capsule (300 mg total) by mouth 2 (two) times daily. 60 capsule 5   . LATUDA 40 MG TABS tablet    Taking  . LINZESS 145 MCG CAPS capsule Take 1 capsule (145 mcg total) by mouth daily. 30 capsule 0 Taking  . meloxicam (MOBIC) 15 MG tablet Take 1 tablet (15 mg total) by mouth at bedtime. 30 tablet 0   . metFORMIN  (GLUCOPHAGE) 500 MG tablet Take one tab in am and 2 in pm with food 90 tablet 5 Taking  . Olopatadine HCl 0.2 % SOLN    Taking  . pantoprazole (PROTONIX) 40 MG tablet Take 1 tablet (40 mg total) by mouth daily. 30 tablet 3 Taking  . pioglitazone (ACTOS) 30 MG tablet Take 1 tablet (30 mg total) by mouth daily. 30 tablet 5   . rosuvastatin (CRESTOR) 10 MG tablet Take 1 tablet (10 mg total) by mouth daily. 90 tablet 3   . SAPHRIS 10 MG SUBL    Taking  . TRAVATAN Z 0.004 % SOLN ophthalmic solution    Taking  . TRULICITY 1.5 DP/8.2UM SOPN inject 1.5 mg once weekly for diabetes 2 mL 0      Allergies  Allergen Reactions  . Ace Inhibitors      Past Medical History:  Diagnosis Date  . Anxiety   . Asthma   . Constipation   . COPD (chronic obstructive pulmonary disease) ([place])   . Depression   . Diabetes ([place])   . GERD (gastroesophageal reflux disease)   . Hypertension   . Sleep apnea     Review of systems:  Otherwise negative.    Physical Exam  Gen: Alert, oriented. Appears stated age.  HEENT: [place]/AT. PERRLA. Lungs: CTA, no wheezes. CV: RR nl S1, S2. Abd: soft, benign, no masses. BS+  Ext: No edema. Pulses 2+    Planned procedures: Proceed with EGD and colonoscopy. The patient understands the nature of the planned procedure, indications, risks, alternatives and potential complications including but not limited to bleeding, infection, perforation, damage to internal organs and possible oversedation/side effects from anesthesia. The patient agrees and gives consent to proceed.  Please refer to procedure notes for findings, recommendations and patient disposition/instructions.     Monta Police K. Rachel Nielsen, M.D. Gastroenterology 07/20/2018  9:30 AM

## 2018-07-20 NOTE — Anesthesia Postprocedure Evaluation (Signed)
Anesthesia Post Note  Patient: Rachel Nielsen  Procedure(s) Performed: ESOPHAGOGASTRODUODENOSCOPY (EGD) (N/A ) COLONOSCOPY WITH PROPOFOL (N/A )  Patient location during evaluation: Endoscopy Anesthesia Type: General Level of consciousness: awake and alert Pain management: pain level controlled Vital Signs Assessment: post-procedure vital signs reviewed and stable Respiratory status: spontaneous breathing, nonlabored ventilation and respiratory function stable Cardiovascular status: blood pressure returned to baseline and stable Postop Assessment: no apparent nausea or vomiting Anesthetic complications: no     Last Vitals:  Vitals:   07/20/18 1040 07/20/18 1050  BP: (!) 140/96 134/89  Pulse: 95 96  Resp: 16 17  Temp:    SpO2: 100% 97%    Last Pain:  Vitals:   07/20/18 0923  TempSrc: Tympanic  PainSc: 0-No pain                 Alphonsus Sias

## 2018-07-20 NOTE — Interval H&P Note (Signed)
History and Physical Interval Note:  07/20/2018 9:32 AM  Rachel Nielsen  has presented today for surgery, with the diagnosis of GERD,ESOPHAGEAL DYSPHAGIA PERSONAL HX.COLON POLYPS  The various methods of treatment have been discussed with the patient and family. After consideration of risks, benefits and other options for treatment, the patient has consented to  Procedure(s): ESOPHAGOGASTRODUODENOSCOPY (EGD) (N/A) COLONOSCOPY WITH PROPOFOL (N/A) as a surgical intervention .  The patient's history has been reviewed, patient examined, no change in status, stable for surgery.  I have reviewed the patient's chart and labs.  Questions were answered to the patient's satisfaction.     [place], [place]

## 2018-07-21 LAB — SURGICAL PATHOLOGY

## 2018-07-26 ENCOUNTER — Other Ambulatory Visit: Payer: Self-pay | Admitting: Internal Medicine

## 2018-08-03 ENCOUNTER — Other Ambulatory Visit: Payer: Self-pay

## 2018-08-03 ENCOUNTER — Telehealth: Payer: Self-pay

## 2018-08-03 DIAGNOSIS — I10 Essential (primary) hypertension: Secondary | ICD-10-CM

## 2018-08-03 MED ORDER — BISOPROLOL FUMARATE 5 MG PO TABS: 5.0000 mg | ORAL_TABLET | Freq: Every day | ORAL | 6 refills | Status: DC

## 2018-08-03 MED ORDER — DULAGLUTIDE 1.5 MG/0.5ML ~~LOC~~ SOAJ: SUBCUTANEOUS | 0 refills | Status: DC

## 2018-08-03 NOTE — Telephone Encounter (Signed)
Send pres to [employer]

## 2018-08-05 ENCOUNTER — Other Ambulatory Visit: Payer: Self-pay

## 2018-08-05 MED ORDER — MELOXICAM 15 MG PO TABS: 15.0000 mg | ORAL_TABLET | Freq: Every day | ORAL | 0 refills | Status: DC

## 2018-08-29 ENCOUNTER — Other Ambulatory Visit: Payer: Self-pay | Admitting: Nurse Practitioner

## 2018-08-30 ENCOUNTER — Other Ambulatory Visit: Payer: Self-pay | Admitting: Internal Medicine

## 2018-08-30 ENCOUNTER — Ambulatory Visit: Payer: Self-pay | Admitting: Adult Health

## 2018-08-31 ENCOUNTER — Other Ambulatory Visit: Payer: Self-pay | Admitting: Nurse Practitioner

## 2018-09-06 ENCOUNTER — Ambulatory Visit: Payer: Self-pay | Admitting: Adult Health

## 2018-09-16 ENCOUNTER — Other Ambulatory Visit: Payer: Self-pay

## 2018-09-16 ENCOUNTER — Ambulatory Visit: Payer: Self-pay | Admitting: Adult Health

## 2018-09-22 ENCOUNTER — Other Ambulatory Visit: Payer: Self-pay | Admitting: Adult Health

## 2018-09-22 ENCOUNTER — Ambulatory Visit (INDEPENDENT_AMBULATORY_CARE_PROVIDER_SITE_OTHER): Payer: Medicare HMO | Admitting: Adult Health

## 2018-09-22 ENCOUNTER — Encounter: Payer: Self-pay | Admitting: Adult Health

## 2018-09-22 VITALS — BP 124/86 | HR 80 | Resp 16 | Ht 64.0 in | Wt 235.0 lb

## 2018-09-22 DIAGNOSIS — Z6841 Body Mass Index (BMI) 40.0 and over, adult: Secondary | ICD-10-CM

## 2018-09-22 DIAGNOSIS — R300 Dysuria: Secondary | ICD-10-CM | POA: Diagnosis not present

## 2018-09-22 DIAGNOSIS — E1165 Type 2 diabetes mellitus with hyperglycemia: Secondary | ICD-10-CM

## 2018-09-22 DIAGNOSIS — Z0001 Encounter for general adult medical examination with abnormal findings: Secondary | ICD-10-CM | POA: Diagnosis not present

## 2018-09-22 DIAGNOSIS — I10 Essential (primary) hypertension: Secondary | ICD-10-CM | POA: Diagnosis not present

## 2018-09-22 DIAGNOSIS — G4733 Obstructive sleep apnea (adult) (pediatric): Secondary | ICD-10-CM

## 2018-09-22 DIAGNOSIS — K219 Gastro-esophageal reflux disease without esophagitis: Secondary | ICD-10-CM

## 2018-09-22 DIAGNOSIS — E119 Type 2 diabetes mellitus without complications: Secondary | ICD-10-CM

## 2018-09-22 DIAGNOSIS — F329 Major depressive disorder, single episode, unspecified: Secondary | ICD-10-CM

## 2018-09-22 DIAGNOSIS — F32A Depression, unspecified: Secondary | ICD-10-CM

## 2018-09-22 LAB — POCT GLYCOSYLATED HEMOGLOBIN (HGB A1C): Hemoglobin A1C: 7.9 % — AB (ref 4.0–5.6)

## 2018-09-22 MED ORDER — PNEUMOCOCCAL 13-VAL CONJ VACC IM SUSP: 0.5000 mL | INTRAMUSCULAR | 0 refills | Status: AC

## 2018-09-22 NOTE — Progress Notes (Signed)
[facility] [place], [place] [address]  Internal MEDICINE  Office Visit Note  Patient Name: Rachel Nielsen  [id number]  [id number]  Date of Service: 09/23/2018  Chief Complaint  Patient presents with  . Annual Exam  . Diabetes  . Hypertension  . Depression  . Sleep Apnea  . Quality Metric Gaps    FOOT EXAM     HPI Pt is here for routine health maintenance examination.  She is a well-appearing 67 year old female.  She has a history of diabetes, hypertension, depression, and sleep apnea.  Her hemoglobin A1c today is 7.9 which is increased from 7.1.  Patient reports that her diet has been different and she has been taking her medication as prescribed.  Her blood pressure is well controlled on current therapies.  She denies any chest pain, shortness of breath, palpitations or headaches.  She reports that her depression is well controlled currently.  Patient denies any sleeping difficulty and acknowledges a history of sleep apnea however she does not wear CPAP machine currently.  She denies tobacco, alcohol, or illicit drug use at this time.  Current Medication: Outpatient Encounter Medications as of 09/22/2018  Medication Sig  . ALPRAZolam (XANAX) 0.5 MG tablet   . amLODipine (NORVASC) 5 MG tablet Take 1 tablet (5 mg total) by mouth daily.  . bisoprolol (ZEBETA) 5 MG tablet Take 1 tablet (5 mg total) by mouth daily.  Marland Kitchen BREO ELLIPTA 200-25 MCG/INH AEPB Inhale 1 puff into the lungs daily.  Marland Kitchen buPROPion (WELLBUTRIN XL) 300 MG 24 hr tablet   . busPIRone (BUSPAR) 15 MG tablet   . DULoxetine (CYMBALTA) 60 MG capsule   . gabapentin (NEURONTIN) 300 MG capsule Take 1 capsule (300 mg total) by mouth 2 (two) times daily.  Marland Kitchen LATUDA 40 MG TABS tablet   . LINZESS 145 MCG CAPS capsule Take 1 capsule (145 mcg total) by mouth daily.  . meloxicam (MOBIC) 15 MG tablet Take 1 tablet (15 mg total) by mouth at bedtime.  . metFORMIN (GLUCOPHAGE) 500 MG tablet Take one tab in am  and 2 in pm with food  . Olopatadine HCl 0.2 % SOLN   . pantoprazole (PROTONIX) 40 MG tablet Take 1 tablet (40 mg total) by mouth daily.  . pioglitazone (ACTOS) 30 MG tablet Take 1 tablet (30 mg total) by mouth daily.  . ranitidine (ZANTAC) 150 MG tablet Take 150 mg by mouth as needed for heartburn.  . rosuvastatin (CRESTOR) 10 MG tablet Take 1 tablet (10 mg total) by mouth daily.  Marland Kitchen SAPHRIS 10 MG SUBL   . TRAVATAN Z 0.004 % SOLN ophthalmic solution   . TRULICITY 1.5 JJ/0.0XF SOPN inject 1.5 mg once weekly for diabetes   No facility-administered encounter medications on file as of 09/22/2018.     Surgical History: Past Surgical History:  Procedure Laterality Date  . APPENDECTOMY    . CHOLECYSTECTOMY    . COLONOSCOPY WITH PROPOFOL N/A 07/20/2018   Procedure: COLONOSCOPY WITH PROPOFOL;  Surgeon: Toledo, Benay Pike, MD;  Location: [facility] ENDOSCOPY;  Service: Gastroenterology;  Laterality: N/A;  . ESOPHAGOGASTRODUODENOSCOPY N/A 07/20/2018   Procedure: ESOPHAGOGASTRODUODENOSCOPY (EGD);  Surgeon: Toledo, Benay Pike, MD;  Location: [facility] ENDOSCOPY;  Service: Gastroenterology;  Laterality: N/A;  . GASTRECTOMY    . TUBAL LIGATION    . VAGINAL HYSTERECTOMY      Medical History: Past Medical History:  Diagnosis Date  . Anxiety   . Asthma   . Constipation   . COPD (chronic  obstructive pulmonary disease) ([place])   . Depression   . Diabetes ([place])   . GERD (gastroesophageal reflux disease)   . Hypertension   . Sleep apnea     Family History: Family History  Problem Relation Age of Onset  . Hypertension Mother   . Atrial fibrillation Mother   . Heart attack Mother   . Hypertension Father   . Stomach cancer Father   . Diabetes Father   . Liver cancer Father   . Diabetes Sister   . Hypertension Sister    Depression screen [facility] 2/9 09/22/2018 05/26/2018 12/21/2017  Decreased Interest 3 2 3   Down, Depressed, Hopeless 3 3 3   PHQ - 2 Score 6 5 6   Altered sleeping 0 2 0  Tired, decreased  energy 3 3 3   Change in appetite 1 2 1   Feeling bad or failure about yourself  2 2 3   Trouble concentrating 0 3 3  Moving slowly or fidgety/restless 0 3 0  Suicidal thoughts 1 2 1   PHQ-9 Score 13 22 17   Difficult doing work/chores Very difficult Very difficult Somewhat difficult    Functional Status Survey: Is the patient deaf or have difficulty hearing?: No Does the patient have difficulty seeing, even when wearing glasses/contacts?: No Does the patient have difficulty concentrating, remembering, or making decisions?: No Does the patient have difficulty walking or climbing stairs?: Yes Does the patient have difficulty dressing or bathing?: No Does the patient have difficulty doing errands alone such as visiting a doctor's office or shopping?: No  MMSE - [place] Exam 09/22/2018  Orientation to time 5  Orientation to Place 5  Registration 3  Attention/ Calculation 5  Recall 3  Language- name 2 objects 2  Language- repeat 1  Language- follow 3 step command 3  Language- read & follow direction 1  Write a sentence 1  Copy design 1  Total score 30    Fall Risk  09/22/2018 05/26/2018 12/21/2017  Falls in the past year? 1 Yes Yes  Number falls in past yr: 1 2 or more 2 or more  Injury with Fall? 0 No No      Review of Systems  Constitutional: Negative for chills, fatigue and unexpected weight change.  HENT: Negative for congestion, rhinorrhea, sneezing and sore throat.   Eyes: Negative for photophobia, pain and redness.  Respiratory: Negative for cough, chest tightness and shortness of breath.   Cardiovascular: Negative for chest pain and palpitations.  Gastrointestinal: Negative for abdominal pain, constipation, diarrhea, nausea and vomiting.  Endocrine: Negative.   Genitourinary: Negative for dysuria and frequency.  Musculoskeletal: Negative for arthralgias, back pain, joint swelling and neck pain.  Skin: Negative for rash.  Allergic/Immunologic: Negative.    Neurological: Negative for tremors and numbness.  Hematological: Negative for adenopathy. Does not bruise/bleed easily.  Psychiatric/Behavioral: Negative for behavioral problems and sleep disturbance. The patient is not nervous/anxious.      Vital Signs: BP 124/86 (BP Location: Left Arm, Patient Position: Sitting, Cuff Size: Normal)   Pulse 80   Resp 16   Ht 5\' 4"  (1.626 m)   Wt 235 lb (106.6 kg)   SpO2 96%   BMI 40.34 kg/m    Physical Exam  Constitutional: She is oriented to person, place, and time. She appears well-developed and well-nourished. No distress.  HENT:  Head: Normocephalic and atraumatic.  Mouth/Throat: Oropharynx is clear and moist. No oropharyngeal exudate.  Eyes: Pupils are equal, round, and reactive to light. EOM are normal.  Neck: Normal range of motion. Neck supple. No JVD present. No tracheal deviation present. No thyromegaly present.  Cardiovascular: Normal rate, regular rhythm and normal heart sounds. Exam reveals no gallop and no friction rub.  No murmur heard. Pulmonary/Chest: Effort normal and breath sounds normal. No respiratory distress. She has no wheezes. She has no rales. She exhibits no tenderness.  Exam Chaperoned by Dorna Bloom CMA   Abdominal: Soft. There is no tenderness. There is no guarding.  Musculoskeletal: Normal range of motion.  Lymphadenopathy:    She has no cervical adenopathy.  Neurological: She is alert and oriented to person, place, and time. No cranial nerve deficit.  Skin: Skin is warm and dry. She is not diaphoretic.  Psychiatric: She has a normal mood and affect. Her behavior is normal. Judgment and thought content normal.  Nursing note and vitals reviewed.    LABS: Recent Results (from the past 2160 hour(s))  Glucose, capillary     Status: Abnormal   Collection Time: 07/20/18  9:32 AM  Result Value Ref Range   Glucose-Capillary 123 (H) 70 - 99 mg/dL  Surgical pathology     Status: None   Collection Time: 07/20/18  10:05 AM  Result Value Ref Range   SURGICAL PATHOLOGY      Surgical Pathology CASE: [id number] PATIENT: [facility] Surgical Pathology Report     SPECIMEN SUBMITTED: A. GEJ, r/o barrett's, irregular z line; cbx  CLINICAL HISTORY: None provided  PRE-OPERATIVE DIAGNOSIS: GERD, esophageal dysphagia, personal HX colon polyps  POST-OPERATIVE DIAGNOSIS: Linear gastritis, small hiatal hernia, fair colon prep, IRREG z line, internal hemorrhoids, esophageal narrowing     DIAGNOSIS: A. GEJ; COLD BIOPSY: - REFLUX GASTROESOPHAGITIS. - NEGATIVE FOR INTESTINAL METAPLASIA, DYSPLASIA, AND MALIGNANCY.   GROSS DESCRIPTION: A. Labeled: Cbx GE junction rule out Barrett's irregular Z line Received: In formalin Tissue fragment(s): 4 Size: 0.2-0.4 cm Description: Pink-tan tissue fragments Entirely submitted in one cassette.    Final Diagnosis performed by Quay Burow, MD.   Electronically signed 07/21/2018 3:21:35PM The electronic signature indicates that the named Attending Pathologist has evaluated the specimen  Techn ical component performed at [facility], [address], [place], [place] [address] Lab: [phone] Dir: Rush Farmer, MD, MMM  Professional component performed at [facility], [facility], [place], [place], [place] [address] Lab: [phone] Dir: Dellia Nims. Rubinas, MD   UA/M w/rflx Culture, Routine     Status: Abnormal (Preliminary result)   Collection Time: 09/22/18  9:25 AM  Result Value Ref Range   Specific Gravity, UA 1.022 1.005 - 1.030   pH, UA 5.0 5.0 - 7.5   Color, UA Yellow Yellow   Appearance Ur Cloudy (A) Clear   Leukocytes, UA 1+ (A) Negative   Protein, UA Negative Negative/Trace   Glucose, UA Negative Negative   Ketones, UA Trace (A) Negative   RBC, UA Trace (A) Negative   Bilirubin, UA Negative Negative   Urobilinogen, Ur 0.2 0.2 - 1.0 mg/dL   Nitrite, UA Positive (A) Negative   Microscopic Examination See below:      Comment: Microscopic was indicated and was performed.   Urinalysis Reflex Comment     Comment: This specimen has reflexed to a Urine Culture.  Microscopic Examination     Status: Abnormal   Collection Time: 09/22/18  9:25 AM  Result Value Ref Range   WBC, UA 0-5 0 - 5 /hpf   RBC, UA 3-10 (A) 0 - 2 /hpf   Epithelial Cells (non renal) 0-10 0 -  10 /hpf   Casts Present (A) None seen /lpf   Cast Type Hyaline casts N/A   Mucus, UA Present Not Estab.   Bacteria, UA Few None seen/Few  Urine Culture, Reflex     Status: None (Preliminary result)   Collection Time: 09/22/18  9:25 AM  Result Value Ref Range   Urine Culture, Routine WILL FOLLOW   POCT HgB A1C     Status: Abnormal   Collection Time: 09/22/18  9:46 AM  Result Value Ref Range   Hemoglobin A1C 7.9 (A) 4.0 - 5.6 %   HbA1c POC (<> result, manual entry)     HbA1c, POC (prediabetic range)     HbA1c, POC (controlled diabetic range)     Assessment/Plan: 1. Encounter for general adult medical examination with abnormal findings Patient up-to-date on preventative health maintenance. Lab orders sent to lab core, patient will have blood drawn and we will review lab results as available.  2. Uncontrolled type 2 diabetes mellitus with hyperglycemia (HCC) Patient's hemoglobin A1c elevated at 7.9 today.  Patient reports that she has had significant diet change because having a significant amount of dental work done in the last few months.  She is been eating a lot of cereal and states that she knows that makes her sugar a little higher. - POCT HgB A1C  3. Dysuria - UA/M w/rflx Culture, Routine  4. Hypertension, unspecified type Stable, continue current medications.  5. Depression, unspecified depression type Stable, continue current medications.  6. Gastroesophageal reflux disease without esophagitis Patient's GERD currently well controlled with pantoprazole.  Continue current medications.  7. OSA (obstructive sleep apnea) Patient  reports she needs a new CPAP machine however she has not been able to get in to get her sleep study done.  She states that her goal is to get that sleep study done sometime at the beginning of the year.  8. Class 3 severe obesity due to excess calories with serious comorbidity and body mass index (BMI) of 40.0 to 44.9 in adult [facility]) Obesity Counseling: Risk Assessment: An assessment of behavioral risk factors was made today and includes lack of exercise sedentary lifestyle, lack of portion control and poor dietary habits.  Risk Modification Advice: She was counseled on portion control guidelines. Restricting daily caloric intake to. . The detrimental long term effects of obesity on her health and ongoing poor compliance was also discussed with the patient.  9. Encounter for diabetic foot exam [facility]) Patient requesting podiatry referral for toenail trimming and foot exam. - Ambulatory referral to Podiatry  General Counseling: Noreene Filbert understanding of the findings of todays visit and agrees with plan of treatment. I have discussed any further diagnostic evaluation that may be needed or ordered today. We also reviewed her medications today. she has been encouraged to call the office with any questions or concerns that should arise related to todays visit.   Orders Placed This Encounter  Procedures  . Microscopic Examination  . Urine Culture, Reflex  . UA/M w/rflx Culture, Routine  . CBC with Differential/Platelet  . Lipid Panel With LDL/HDL Ratio  . TSH  . T4, free  . Comprehensive metabolic panel  . Ambulatory referral to Podiatry  . POCT HgB A1C    No orders of the defined types were placed in this encounter.   Time spent: 35 Minutes   This patient was seen by Orson Gear AGNP-C in Collaboration with Dr Lavera Guise as a part of collaborative care agreement    Quita Skye  Lemar Lofty AGNP-C Internal Medicine

## 2018-09-22 NOTE — Patient Instructions (Signed)
Diabetes Mellitus and Nutrition When you have diabetes (diabetes mellitus), it is very important to have healthy eating habits because your blood sugar (glucose) levels are greatly affected by what you eat and drink. Eating healthy foods in the appropriate amounts, at about the same times every day, can help you:  Control your blood glucose.  Lower your risk of heart disease.  Improve your blood pressure.  Reach or maintain a healthy weight.  Every person with diabetes is different, and each person has different needs for a meal plan. Your health care provider may recommend that you work with a diet and nutrition specialist (dietitian) to make a meal plan that is best for you. Your meal plan may vary depending on factors such as:  The calories you need.  The medicines you take.  Your weight.  Your blood glucose, blood pressure, and cholesterol levels.  Your activity level.  Other health conditions you have, such as heart or kidney disease.  How do carbohydrates affect me? Carbohydrates affect your blood glucose level more than any other type of food. Eating carbohydrates naturally increases the amount of glucose in your blood. Carbohydrate counting is a method for keeping track of how many carbohydrates you eat. Counting carbohydrates is important to keep your blood glucose at a healthy level, especially if you use insulin or take certain oral diabetes medicines. It is important to know how many carbohydrates you can safely have in each meal. This is different for every person. Your dietitian can help you calculate how many carbohydrates you should have at each meal and for snack. Foods that contain carbohydrates include:  Bread, cereal, rice, pasta, and crackers.  Potatoes and corn.  Peas, beans, and lentils.  Milk and yogurt.  Fruit and juice.  Desserts, such as cakes, cookies, ice cream, and candy.  How does alcohol affect me? Alcohol can cause a sudden decrease in blood  glucose (hypoglycemia), especially if you use insulin or take certain oral diabetes medicines. Hypoglycemia can be a life-threatening condition. Symptoms of hypoglycemia (sleepiness, dizziness, and confusion) are similar to symptoms of having too much alcohol. If your health care provider says that alcohol is safe for you, follow these guidelines:  Limit alcohol intake to no more than 1 drink per day for nonpregnant women and 2 drinks per day for men. One drink equals 12 oz of beer, 5 oz of wine, or 1 oz of hard liquor.  Do not drink on an empty stomach.  Keep yourself hydrated with water, diet soda, or unsweetened iced tea.  Keep in mind that regular soda, juice, and other mixers may contain a lot of sugar and must be counted as carbohydrates.  What are tips for following this plan? Reading food labels  Start by checking the serving size on the label. The amount of calories, carbohydrates, fats, and other nutrients listed on the label are based on one serving of the food. Many foods contain more than one serving per package.  Check the total grams (g) of carbohydrates in one serving. You can calculate the number of servings of carbohydrates in one serving by dividing the total carbohydrates by 15. For example, if a food has 30 g of total carbohydrates, it would be equal to 2 servings of carbohydrates.  Check the number of grams (g) of saturated and trans fats in one serving. Choose foods that have low or no amount of these fats.  Check the number of milligrams (mg) of sodium in one serving. Most people   should limit total sodium intake to less than 2,300 mg per day.  Always check the nutrition information of foods labeled as "low-fat" or "nonfat". These foods may be higher in added sugar or refined carbohydrates and should be avoided.  Talk to your dietitian to identify your daily goals for nutrients listed on the label. Shopping  Avoid buying canned, premade, or processed foods. These  foods tend to be high in fat, sodium, and added sugar.  Shop around the outside edge of the grocery store. This includes fresh fruits and vegetables, bulk grains, fresh meats, and fresh dairy. Cooking  Use low-heat cooking methods, such as baking, instead of high-heat cooking methods like deep frying.  Cook using healthy oils, such as olive, canola, or sunflower oil.  Avoid cooking with butter, cream, or high-fat meats. Meal planning  Eat meals and snacks regularly, preferably at the same times every day. Avoid going long periods of time without eating.  Eat foods high in fiber, such as fresh fruits, vegetables, beans, and whole grains. Talk to your dietitian about how many servings of carbohydrates you can eat at each meal.  Eat 4-6 ounces of lean protein each day, such as lean meat, chicken, fish, eggs, or tofu. 1 ounce is equal to 1 ounce of meat, chicken, or fish, 1 egg, or 1/4 cup of tofu.  Eat some foods each day that contain healthy fats, such as avocado, nuts, seeds, and fish. Lifestyle   Check your blood glucose regularly.  Exercise at least 30 minutes 5 or more days each week, or as told by your health care provider.  Take medicines as told by your health care provider.  Do not use any products that contain nicotine or tobacco, such as cigarettes and e-cigarettes. If you need help quitting, ask your health care provider.  Work with a counselor or diabetes educator to identify strategies to manage stress and any emotional and social challenges. What are some questions to ask my health care provider?  Do I need to meet with a diabetes educator?  Do I need to meet with a dietitian?  What number can I call if I have questions?  When are the best times to check my blood glucose? Where to find more information:  American Diabetes Association: diabetes.org/food-and-fitness/food  Academy of Nutrition and Dietetics:  www.eatright.org/resources/health/diseases-and-conditions/diabetes  National Institute of Diabetes and Digestive and Kidney Diseases (NIH): www.niddk.nih.gov/health-information/diabetes/overview/diet-eating-physical-activity Summary  A healthy meal plan will help you control your blood glucose and maintain a healthy lifestyle.  Working with a diet and nutrition specialist (dietitian) can help you make a meal plan that is best for you.  Keep in mind that carbohydrates and alcohol have immediate effects on your blood glucose levels. It is important to count carbohydrates and to use alcohol carefully. This information is not intended to replace advice given to you by your health care provider. Make sure you discuss any questions you have with your health care provider. Document Released: 07/16/2005 Document Revised: 11/23/2016 Document Reviewed: 11/23/2016 Elsevier Interactive Patient Education  2018 Elsevier Inc.  

## 2018-09-23 ENCOUNTER — Other Ambulatory Visit: Payer: Self-pay | Admitting: Adult Health

## 2018-09-25 LAB — UA/M W/RFLX CULTURE, ROUTINE
Bilirubin, UA: NEGATIVE
Glucose, UA: NEGATIVE
Nitrite, UA: POSITIVE — AB
Protein, UA: NEGATIVE
Specific Gravity, UA: 1.022 (ref 1.005–1.030)
Urobilinogen, Ur: 0.2 mg/dL (ref 0.2–1.0)
pH, UA: 5 (ref 5.0–7.5)

## 2018-09-25 LAB — MICROSCOPIC EXAMINATION

## 2018-09-25 LAB — URINE CULTURE, REFLEX

## 2018-09-27 ENCOUNTER — Other Ambulatory Visit: Payer: Self-pay | Admitting: Adult Health

## 2018-09-27 ENCOUNTER — Other Ambulatory Visit: Payer: Self-pay | Admitting: Internal Medicine

## 2018-09-27 ENCOUNTER — Telehealth: Payer: Self-pay | Admitting: Adult Health

## 2018-09-27 MED ORDER — SULFAMETHOXAZOLE-TRIMETHOPRIM 800-160 MG PO TABS: 1.0000 | ORAL_TABLET | Freq: Two times a day (BID) | ORAL | 0 refills | Status: DC

## 2018-09-27 NOTE — Progress Notes (Signed)
RX for Bactrim sent to [facility] in [place] for e. Coli in urine.

## 2018-09-27 NOTE — Telephone Encounter (Signed)
Pt advised on urine culture and antibiotic at pharmacy

## 2018-09-28 ENCOUNTER — Other Ambulatory Visit: Payer: Self-pay

## 2018-09-28 ENCOUNTER — Other Ambulatory Visit: Payer: Self-pay | Admitting: Internal Medicine

## 2018-09-28 MED ORDER — FLUTICASONE FUROATE-VILANTEROL 200-25 MCG/INH IN AEPB: 1.0000 | INHALATION_SPRAY | Freq: Every day | RESPIRATORY_TRACT | 3 refills | Status: DC

## 2018-10-27 ENCOUNTER — Other Ambulatory Visit: Payer: Self-pay | Admitting: Internal Medicine

## 2018-10-27 ENCOUNTER — Other Ambulatory Visit: Payer: Self-pay | Admitting: Adult Health

## 2018-10-27 DIAGNOSIS — E1165 Type 2 diabetes mellitus with hyperglycemia: Secondary | ICD-10-CM

## 2018-10-28 ENCOUNTER — Other Ambulatory Visit: Payer: Self-pay | Admitting: Adult Health

## 2018-11-01 ENCOUNTER — Other Ambulatory Visit: Payer: Self-pay | Admitting: Adult Health

## 2018-11-03 ENCOUNTER — Other Ambulatory Visit: Payer: Self-pay

## 2018-11-03 ENCOUNTER — Ambulatory Visit (INDEPENDENT_AMBULATORY_CARE_PROVIDER_SITE_OTHER): Payer: Medicare HMO | Admitting: Adult Health

## 2018-11-03 ENCOUNTER — Encounter: Payer: Self-pay | Admitting: Adult Health

## 2018-11-03 VITALS — BP 147/87 | HR 90 | Temp 98.0°F | Resp 16 | Ht 64.0 in | Wt 230.8 lb

## 2018-11-03 DIAGNOSIS — J029 Acute pharyngitis, unspecified: Secondary | ICD-10-CM

## 2018-11-03 DIAGNOSIS — J0110 Acute frontal sinusitis, unspecified: Secondary | ICD-10-CM | POA: Diagnosis not present

## 2018-11-03 DIAGNOSIS — B379 Candidiasis, unspecified: Secondary | ICD-10-CM

## 2018-11-03 LAB — POCT RAPID STREP A (OFFICE): Rapid Strep A Screen: NEGATIVE

## 2018-11-03 MED ORDER — MAGIC MOUTHWASH W/LIDOCAINE: 5.0000 mL | Freq: Two times a day (BID) | ORAL | 0 refills | Status: DC

## 2018-11-03 MED ORDER — AMOXICILLIN-POT CLAVULANATE 875-125 MG PO TABS: 1.0000 | ORAL_TABLET | Freq: Two times a day (BID) | ORAL | 0 refills | Status: DC

## 2018-11-03 NOTE — Patient Instructions (Signed)

## 2018-11-03 NOTE — Progress Notes (Signed)
[facility] [place], [place] [address]  Internal MEDICINE  Office Visit Note  Patient Name: Rachel Nielsen  [id number]  [id number]  Date of Service: 11/03/2018  Chief Complaint  Patient presents with  . Sore Throat    right side hurts  . Cough    chest congestion  . Ear Pain    when she swallows her ear aches     HPI Pt is here for a sick visit.  Patient here reporting about a week of sore throat, cough, congestion as well as ear pain.  She states ear pain typically comes when she swallows.  She denies any fever or chills currently.  She does report some sinus pain and pressure as well.  She describes her throat as severe and wants to make sure she does not have strep.     Current Medication:  Outpatient Encounter Medications as of 11/03/2018  Medication Sig  . ALPRAZolam (XANAX) 0.5 MG tablet   . amLODipine (NORVASC) 5 MG tablet Take 1 tablet (5 mg total) by mouth daily.  . bisoprolol (ZEBETA) 5 MG tablet Take 1 tablet (5 mg total) by mouth daily.  Marland Kitchen buPROPion (WELLBUTRIN XL) 300 MG 24 hr tablet   . busPIRone (BUSPAR) 15 MG tablet   . DULoxetine (CYMBALTA) 60 MG capsule   . fluticasone furoate-vilanterol (BREO ELLIPTA) 200-25 MCG/INH AEPB Inhale 1 puff into the lungs daily.  Marland Kitchen gabapentin (NEURONTIN) 300 MG capsule Take 1 capsule (300 mg total) by mouth 2 (two) times daily.  Marland Kitchen LATUDA 40 MG TABS tablet   . LINZESS 145 MCG CAPS capsule Take 1 capsule (145 mcg total) by mouth daily.  . meloxicam (MOBIC) 15 MG tablet Take 1 tablet (15 mg total) by mouth at bedtime.  . metFORMIN (GLUCOPHAGE) 500 MG tablet Take one tab in am and 2 in pm with food  . Olopatadine HCl 0.2 % SOLN   . pantoprazole (PROTONIX) 40 MG tablet Take 1 tablet (40 mg total) by mouth daily.  . pioglitazone (ACTOS) 30 MG tablet Take 1 tablet (30 mg total) by mouth daily.  Marland Kitchen PROAIR HFA 108 (90 Base) MCG/ACT inhaler TAKE TWO PUFFS THREE TIMES A DAY.  . ranitidine (ZANTAC) 150 MG tablet  Take 150 mg by mouth as needed for heartburn.  . rosuvastatin (CRESTOR) 10 MG tablet Take 1 tablet (10 mg total) by mouth daily.  Marland Kitchen SAPHRIS 10 MG SUBL   . sulfamethoxazole-trimethoprim (BACTRIM DS,SEPTRA DS) 800-160 MG tablet Take 1 tablet by mouth 2 (two) times daily.  . TRAVATAN Z 0.004 % SOLN ophthalmic solution   . TRULICITY 1.5 JQ/3.0SP SOPN inject 1.5 mg once weekly for diabetes  . [DISCONTINUED] magic mouthwash w/lidocaine SOLN Take 5 mLs by mouth. Use as directed 5 ml twice a day spit and swish  . amoxicillin-clavulanate (AUGMENTIN) 875-125 MG tablet Take 1 tablet by mouth 2 (two) times daily.   No facility-administered encounter medications on file as of 11/03/2018.       Medical History: Past Medical History:  Diagnosis Date  . Anxiety   . Asthma   . Constipation   . COPD (chronic obstructive pulmonary disease) ([place])   . Depression   . Diabetes ([place])   . GERD (gastroesophageal reflux disease)   . Hypertension   . Sleep apnea      Vital Signs: BP (!) 147/87   Pulse 90   Temp 98 F (36.7 C)   Resp 16   Ht 5\' 4"  (1.626 m)  Wt 230 lb 12.8 oz (104.7 kg)   SpO2 97%   BMI 39.62 kg/m    Review of Systems  Constitutional: Negative for chills, fatigue and unexpected weight change.  HENT: Positive for ear pain, postnasal drip, sinus pressure, sinus pain and sore throat. Negative for congestion, rhinorrhea and sneezing.   Eyes: Negative for photophobia, pain and redness.  Respiratory: Positive for cough. Negative for chest tightness and shortness of breath.   Cardiovascular: Negative for chest pain and palpitations.  Gastrointestinal: Negative for abdominal pain, constipation, diarrhea, nausea and vomiting.  Endocrine: Negative.   Genitourinary: Negative for dysuria and frequency.  Musculoskeletal: Negative for arthralgias, back pain, joint swelling and neck pain.  Skin: Negative for rash.  Allergic/Immunologic: Negative.   Neurological: Negative for tremors and  numbness.  Hematological: Negative for adenopathy. Does not bruise/bleed easily.  Psychiatric/Behavioral: Negative for behavioral problems and sleep disturbance. The patient is not nervous/anxious.     Physical Exam Vitals signs and nursing note reviewed.  Constitutional:      General: She is not in acute distress.    Appearance: She is well-developed. She is not diaphoretic.  HENT:     Head: Normocephalic and atraumatic.     Right Ear: Tenderness present.     Nose: Congestion present.     Mouth/Throat:     Pharynx: No oropharyngeal exudate.  Eyes:     Pupils: Pupils are equal, round, and reactive to light.  Neck:     Musculoskeletal: Normal range of motion and neck supple.     Thyroid: No thyromegaly.     Vascular: No JVD.     Trachea: No tracheal deviation.  Cardiovascular:     Rate and Rhythm: Normal rate and regular rhythm.     Heart sounds: Normal heart sounds. No murmur. No friction rub. No gallop.   Pulmonary:     Effort: Pulmonary effort is normal. No respiratory distress.     Breath sounds: Normal breath sounds. No wheezing or rales.  Chest:     Chest wall: No tenderness.  Abdominal:     Palpations: Abdomen is soft.     Tenderness: There is no abdominal tenderness. There is no guarding.  Musculoskeletal: Normal range of motion.  Lymphadenopathy:     Cervical: No cervical adenopathy.  Skin:    General: Skin is warm and dry.  Neurological:     Mental Status: She is alert and oriented to person, place, and time.     Cranial Nerves: No cranial nerve deficit.  Psychiatric:        Behavior: Behavior normal.        Thought Content: Thought content normal.        Judgment: Judgment normal.    Assessment/Plan: 1. Acute non-recurrent frontal sinusitis Patient provided with course of Augmentin.  Instructed patient to take all medications until completed.  Also instructed patient to follow-up in clinic in 7 to 10 days if symptoms fail to improve. -  amoxicillin-clavulanate (AUGMENTIN) 875-125 MG tablet; Take 1 tablet by mouth 2 (two) times daily.  Dispense: 14 tablet; Refill: 0  2. Candidiasis Patient provided with [place] Magic mouthwash to help alleviate her sore throat as well as Candida.  3. Sore throat Red strep negative. - POCT rapid strep A  General Counseling: seher schlagel understanding of the findings of todays visit and agrees with plan of treatment. I have discussed any further diagnostic evaluation that may be needed or ordered today. We also reviewed her medications today. she has  been encouraged to call the office with any questions or concerns that should arise related to todays visit.   Orders Placed This Encounter  Procedures  . POCT rapid strep A    Meds ordered this encounter  Medications  . amoxicillin-clavulanate (AUGMENTIN) 875-125 MG tablet    Sig: Take 1 tablet by mouth 2 (two) times daily.    Dispense:  14 tablet    Refill:  0    Time spent: 25 Minutes  This patient was seen by Orson Gear AGNP-C in Collaboration with Dr Lavera Guise as a part of collaborative care agreement.  Kendell Bane AGNP-C Internal Medicine

## 2018-11-17 ENCOUNTER — Emergency Department: Payer: Medicare HMO

## 2018-11-17 ENCOUNTER — Encounter: Payer: Self-pay | Admitting: Emergency Medicine

## 2018-11-17 ENCOUNTER — Inpatient Hospital Stay
Admission: EM | Admit: 2018-11-17 | Discharge: 2018-11-19 | DRG: 871 | Disposition: A | Discharge status: Home Health | Payer: Medicare HMO | Attending: Internal Medicine | Admitting: Internal Medicine

## 2018-11-17 DIAGNOSIS — G4733 Obstructive sleep apnea (adult) (pediatric): Secondary | ICD-10-CM | POA: Diagnosis present

## 2018-11-17 DIAGNOSIS — E119 Type 2 diabetes mellitus without complications: Secondary | ICD-10-CM | POA: Diagnosis present

## 2018-11-17 DIAGNOSIS — J440 Chronic obstructive pulmonary disease with acute lower respiratory infection: Secondary | ICD-10-CM | POA: Diagnosis present

## 2018-11-17 DIAGNOSIS — E785 Hyperlipidemia, unspecified: Secondary | ICD-10-CM | POA: Diagnosis present

## 2018-11-17 DIAGNOSIS — N179 Acute kidney failure, unspecified: Secondary | ICD-10-CM

## 2018-11-17 DIAGNOSIS — Z8249 Family history of ischemic heart disease and other diseases of the circulatory system: Secondary | ICD-10-CM | POA: Diagnosis not present

## 2018-11-17 DIAGNOSIS — Z833 Family history of diabetes mellitus: Secondary | ICD-10-CM | POA: Diagnosis not present

## 2018-11-17 DIAGNOSIS — Z791 Long term (current) use of non-steroidal anti-inflammatories (NSAID): Secondary | ICD-10-CM

## 2018-11-17 DIAGNOSIS — K219 Gastro-esophageal reflux disease without esophagitis: Secondary | ICD-10-CM | POA: Diagnosis present

## 2018-11-17 DIAGNOSIS — I10 Essential (primary) hypertension: Secondary | ICD-10-CM | POA: Diagnosis present

## 2018-11-17 DIAGNOSIS — I214 Non-ST elevation (NSTEMI) myocardial infarction: Secondary | ICD-10-CM

## 2018-11-17 DIAGNOSIS — Z7984 Long term (current) use of oral hypoglycemic drugs: Secondary | ICD-10-CM

## 2018-11-17 DIAGNOSIS — Z9049 Acquired absence of other specified parts of digestive tract: Secondary | ICD-10-CM

## 2018-11-17 DIAGNOSIS — E878 Other disorders of electrolyte and fluid balance, not elsewhere classified: Secondary | ICD-10-CM | POA: Diagnosis present

## 2018-11-17 DIAGNOSIS — Z792 Long term (current) use of antibiotics: Secondary | ICD-10-CM

## 2018-11-17 DIAGNOSIS — F419 Anxiety disorder, unspecified: Secondary | ICD-10-CM | POA: Diagnosis present

## 2018-11-17 DIAGNOSIS — Z7951 Long term (current) use of inhaled steroids: Secondary | ICD-10-CM

## 2018-11-17 DIAGNOSIS — Z800 Family history of malignant neoplasm of digestive organs: Secondary | ICD-10-CM | POA: Diagnosis not present

## 2018-11-17 DIAGNOSIS — E1165 Type 2 diabetes mellitus with hyperglycemia: Secondary | ICD-10-CM

## 2018-11-17 DIAGNOSIS — R55 Syncope and collapse: Secondary | ICD-10-CM

## 2018-11-17 DIAGNOSIS — A419 Sepsis, unspecified organism: Secondary | ICD-10-CM | POA: Diagnosis not present

## 2018-11-17 DIAGNOSIS — F329 Major depressive disorder, single episode, unspecified: Secondary | ICD-10-CM | POA: Diagnosis present

## 2018-11-17 DIAGNOSIS — R402252 Coma scale, best verbal response, oriented, at arrival to emergency department: Secondary | ICD-10-CM | POA: Diagnosis present

## 2018-11-17 DIAGNOSIS — R471 Dysarthria and anarthria: Secondary | ICD-10-CM | POA: Diagnosis present

## 2018-11-17 DIAGNOSIS — J189 Pneumonia, unspecified organism: Secondary | ICD-10-CM

## 2018-11-17 DIAGNOSIS — R402362 Coma scale, best motor response, obeys commands, at arrival to emergency department: Secondary | ICD-10-CM | POA: Diagnosis present

## 2018-11-17 DIAGNOSIS — I248 Other forms of acute ischemic heart disease: Secondary | ICD-10-CM | POA: Diagnosis present

## 2018-11-17 DIAGNOSIS — E860 Dehydration: Secondary | ICD-10-CM | POA: Diagnosis present

## 2018-11-17 DIAGNOSIS — Z79899 Other long term (current) drug therapy: Secondary | ICD-10-CM

## 2018-11-17 DIAGNOSIS — F17290 Nicotine dependence, other tobacco product, uncomplicated: Secondary | ICD-10-CM | POA: Diagnosis present

## 2018-11-17 DIAGNOSIS — R197 Diarrhea, unspecified: Secondary | ICD-10-CM | POA: Diagnosis present

## 2018-11-17 DIAGNOSIS — D638 Anemia in other chronic diseases classified elsewhere: Secondary | ICD-10-CM | POA: Diagnosis present

## 2018-11-17 DIAGNOSIS — R402142 Coma scale, eyes open, spontaneous, at arrival to emergency department: Secondary | ICD-10-CM | POA: Diagnosis present

## 2018-11-17 DIAGNOSIS — R4781 Slurred speech: Secondary | ICD-10-CM | POA: Diagnosis present

## 2018-11-17 DIAGNOSIS — Z888 Allergy status to other drugs, medicaments and biological substances status: Secondary | ICD-10-CM | POA: Diagnosis not present

## 2018-11-17 DIAGNOSIS — R6520 Severe sepsis without septic shock: Secondary | ICD-10-CM

## 2018-11-17 LAB — URINALYSIS, COMPLETE (UACMP) WITH MICROSCOPIC
Bacteria, UA: NONE SEEN
Bilirubin Urine: NEGATIVE
Glucose, UA: 150 mg/dL — AB
Hgb urine dipstick: NEGATIVE
Ketones, ur: 5 mg/dL — AB
Leukocytes, UA: NEGATIVE
Nitrite: NEGATIVE
Protein, ur: 100 mg/dL — AB
Specific Gravity, Urine: 1.027 (ref 1.005–1.030)
pH: 5 (ref 5.0–8.0)

## 2018-11-17 LAB — CBC WITH DIFFERENTIAL/PLATELET
Abs Immature Granulocytes: 0.21 10*3/uL — ABNORMAL HIGH (ref 0.00–0.07)
Basophils Absolute: 0 10*3/uL (ref 0.0–0.1)
Basophils Relative: 0 %
Eosinophils Absolute: 0 10*3/uL (ref 0.0–0.5)
Eosinophils Relative: 0 %
HCT: 34.5 % — ABNORMAL LOW (ref 36.0–46.0)
Hemoglobin: 10.1 g/dL — ABNORMAL LOW (ref 12.0–15.0)
Immature Granulocytes: 1 %
Lymphocytes Relative: 6 %
Lymphs Abs: 1.2 10*3/uL (ref 0.7–4.0)
MCH: 24 pg — ABNORMAL LOW (ref 26.0–34.0)
MCHC: 29.3 g/dL — ABNORMAL LOW (ref 30.0–36.0)
MCV: 82.1 fL (ref 80.0–100.0)
Monocytes Absolute: 0.8 10*3/uL (ref 0.1–1.0)
Monocytes Relative: 4 %
Neutro Abs: 17.6 10*3/uL — ABNORMAL HIGH (ref 1.7–7.7)
Neutrophils Relative %: 89 %
Platelets: 222 10*3/uL (ref 150–400)
RBC: 4.2 MIL/uL (ref 3.87–5.11)
RDW: 17 % — ABNORMAL HIGH (ref 11.5–15.5)
Smear Review: NORMAL
WBC: 19.8 10*3/uL — ABNORMAL HIGH (ref 4.0–10.5)
nRBC: 0.1 % (ref 0.0–0.2)

## 2018-11-17 LAB — LACTIC ACID, PLASMA
Lactic Acid, Venous: 2.9 mmol/L (ref 0.5–1.9)
Lactic Acid, Venous: 2.5 mmol/L (ref 0.5–1.9)
Lactic Acid, Venous: 2.1 mmol/L (ref 0.5–1.9)

## 2018-11-17 LAB — COMPREHENSIVE METABOLIC PANEL WITH GFR
ALT: 13 U/L (ref 0–44)
AST: 20 U/L (ref 15–41)
Albumin: 3.5 g/dL (ref 3.5–5.0)
Alkaline Phosphatase: 89 U/L (ref 38–126)
Anion gap: 12 (ref 5–15)
BUN: 20 mg/dL (ref 8–23)
CO2: 24 mmol/L (ref 22–32)
Calcium: 8.2 mg/dL — ABNORMAL LOW (ref 8.9–10.3)
Chloride: 98 mmol/L (ref 98–111)
Creatinine, Ser: 1.17 mg/dL — ABNORMAL HIGH (ref 0.44–1.00)
GFR calc Af Amer: 57 mL/min — ABNORMAL LOW
GFR calc non Af Amer: 49 mL/min — ABNORMAL LOW
Glucose, Bld: 296 mg/dL — ABNORMAL HIGH (ref 70–99)
Potassium: 3.6 mmol/L (ref 3.5–5.1)
Sodium: 134 mmol/L — ABNORMAL LOW (ref 135–145)
Total Bilirubin: 0.9 mg/dL (ref 0.3–1.2)
Total Protein: 7.6 g/dL (ref 6.5–8.1)

## 2018-11-17 LAB — INFLUENZA PANEL BY PCR (TYPE A & B)
Influenza A By PCR: NEGATIVE
Influenza B By PCR: NEGATIVE

## 2018-11-17 LAB — TROPONIN I
Troponin I: 0.07 ng/mL (ref <0.03)
Troponin I: <0.03 ng/mL (ref <0.03)
Troponin I: 0.04 ng/mL (ref <0.03)

## 2018-11-17 LAB — CBC
HCT: 30.1 % — ABNORMAL LOW (ref 36.0–46.0)
Hemoglobin: 9.1 g/dL — ABNORMAL LOW (ref 12.0–15.0)
MCH: 24.7 pg — ABNORMAL LOW (ref 26.0–34.0)
MCHC: 30.2 g/dL (ref 30.0–36.0)
MCV: 81.6 fL (ref 80.0–100.0)
Platelets: 178 10*3/uL (ref 150–400)
RBC: 3.69 MIL/uL — ABNORMAL LOW (ref 3.87–5.11)
RDW: 17.2 % — ABNORMAL HIGH (ref 11.5–15.5)
WBC: 20.6 10*3/uL — ABNORMAL HIGH (ref 4.0–10.5)
nRBC: 0 % (ref 0.0–0.2)

## 2018-11-17 LAB — CG4 I-STAT (LACTIC ACID): Lactic Acid, Venous: 3.65 mmol/L (ref 0.5–1.9)

## 2018-11-17 LAB — GLUCOSE, CAPILLARY
Glucose-Capillary: 206 mg/dL — ABNORMAL HIGH (ref 70–99)
Glucose-Capillary: 236 mg/dL — ABNORMAL HIGH (ref 70–99)

## 2018-11-17 LAB — PROCALCITONIN: Procalcitonin: 1.4 ng/mL

## 2018-11-17 MED ORDER — OLOPATADINE HCL 0.1 % OP SOLN
1.0000 [drp] | Freq: Two times a day (BID) | OPHTHALMIC | Status: DC
  Administered 2018-11-18: 1 [drp] via OPHTHALMIC
  Filled 2018-11-17: qty 5

## 2018-11-17 MED ORDER — ALPRAZOLAM 1 MG PO TABS
1.0000 mg | ORAL_TABLET | Freq: Every day | ORAL | Status: DC
  Administered 2018-11-18: 01:00:00 1 mg via ORAL
  Filled 2018-11-17: qty 1

## 2018-11-17 MED ORDER — MOMETASONE FURO-FORMOTEROL FUM 200-5 MCG/ACT IN AERO
1.0000 | INHALATION_SPRAY | Freq: Two times a day (BID) | RESPIRATORY_TRACT | Status: DC
  Administered 2018-11-18 – 2018-11-19 (×2): 1 via RESPIRATORY_TRACT
  Filled 2018-11-17: qty 8.8

## 2018-11-17 MED ORDER — DULOXETINE HCL 30 MG PO CPEP
60.0000 mg | ORAL_CAPSULE | Freq: Every day | ORAL | Status: DC
  Administered 2018-11-18 – 2018-11-19 (×2): 60 mg via ORAL
  Filled 2018-11-17 (×2): qty 2

## 2018-11-17 MED ORDER — SODIUM CHLORIDE 0.9 % IV SOLN
INTRAVENOUS | Status: AC
  Administered 2018-11-18: 01:00:00 via INTRAVENOUS

## 2018-11-17 MED ORDER — PANTOPRAZOLE SODIUM 40 MG PO TBEC
40.0000 mg | DELAYED_RELEASE_TABLET | Freq: Every day | ORAL | Status: DC
  Administered 2018-11-18 – 2018-11-19 (×2): 40 mg via ORAL
  Filled 2018-11-17 (×2): qty 1

## 2018-11-17 MED ORDER — LATANOPROST 0.005 % OP SOLN
1.0000 [drp] | Freq: Every day | OPHTHALMIC | Status: DC
  Administered 2018-11-18: 01:00:00 1 [drp] via OPHTHALMIC
  Filled 2018-11-17: qty 2.5

## 2018-11-17 MED ORDER — INSULIN ASPART 100 UNIT/ML ~~LOC~~ SOLN
0.0000 [IU] | Freq: Three times a day (TID) | SUBCUTANEOUS | Status: DC
  Administered 2018-11-18 (×2): 2 [IU] via SUBCUTANEOUS
  Administered 2018-11-19: 13:00:00 3 [IU] via SUBCUTANEOUS
  Administered 2018-11-19: 2 [IU] via SUBCUTANEOUS
  Filled 2018-11-17: qty 1

## 2018-11-17 MED ORDER — SODIUM CHLORIDE 0.9 % IV BOLUS
1000.0000 mL | Freq: Once | INTRAVENOUS | Status: AC
  Administered 2018-11-17: 1000 mL via INTRAVENOUS

## 2018-11-17 MED ORDER — AMLODIPINE BESYLATE 5 MG PO TABS
5.0000 mg | ORAL_TABLET | Freq: Every day | ORAL | Status: DC
  Administered 2018-11-18 – 2018-11-19 (×2): 5 mg via ORAL
  Filled 2018-11-17 (×2): qty 1

## 2018-11-17 MED ORDER — SODIUM CHLORIDE 0.9 % IV SOLN
500.0000 mg | Freq: Once | INTRAVENOUS | Status: AC
  Administered 2018-11-17: 500 mg via INTRAVENOUS
  Filled 2018-11-17: qty 500

## 2018-11-17 MED ORDER — ALPRAZOLAM 0.5 MG PO TABS
0.5000 mg | ORAL_TABLET | Freq: Two times a day (BID) | ORAL | Status: DC
  Administered 2018-11-18 – 2018-11-19 (×3): 0.5 mg via ORAL
  Filled 2018-11-17 (×3): qty 1

## 2018-11-17 MED ORDER — ROSUVASTATIN CALCIUM 10 MG PO TABS
10.0000 mg | ORAL_TABLET | Freq: Every day | ORAL | Status: DC
  Administered 2018-11-18 – 2018-11-19 (×2): 10 mg via ORAL
  Filled 2018-11-17 (×2): qty 1

## 2018-11-17 MED ORDER — SODIUM CHLORIDE 0.9 % IV SOLN
1.0000 g | Freq: Once | INTRAVENOUS | Status: AC
  Administered 2018-11-17: 1 g via INTRAVENOUS
  Filled 2018-11-17: qty 10

## 2018-11-17 MED ORDER — MELOXICAM 7.5 MG PO TABS
15.0000 mg | ORAL_TABLET | Freq: Every day | ORAL | Status: DC
  Administered 2018-11-18: 15 mg via ORAL
  Filled 2018-11-17 (×3): qty 2

## 2018-11-17 MED ORDER — BUSPIRONE HCL 15 MG PO TABS
15.0000 mg | ORAL_TABLET | Freq: Two times a day (BID) | ORAL | Status: DC
  Administered 2018-11-18 – 2018-11-19 (×2): 15 mg via ORAL
  Filled 2018-11-17 (×5): qty 1

## 2018-11-17 MED ORDER — ALBUTEROL SULFATE (2.5 MG/3ML) 0.083% IN NEBU: 2.5000 mg | INHALATION_SOLUTION | RESPIRATORY_TRACT | Status: DC | PRN

## 2018-11-17 MED ORDER — BISOPROLOL FUMARATE 5 MG PO TABS
5.0000 mg | ORAL_TABLET | Freq: Every day | ORAL | Status: DC
  Administered 2018-11-18 – 2018-11-19 (×2): 5 mg via ORAL
  Filled 2018-11-17 (×3): qty 1

## 2018-11-17 MED ORDER — FLUTICASONE PROPIONATE 50 MCG/ACT NA SUSP
1.0000 | Freq: Every day | NASAL | Status: DC
  Administered 2018-11-18 – 2018-11-19 (×2): 1 via NASAL
  Filled 2018-11-17: qty 16

## 2018-11-17 MED ORDER — ACETAMINOPHEN 325 MG PO TABS
650.0000 mg | ORAL_TABLET | Freq: Four times a day (QID) | ORAL | Status: DC | PRN
  Administered 2018-11-17: 650 mg via ORAL
  Filled 2018-11-17: qty 2

## 2018-11-17 MED ORDER — FLUTICASONE FUROATE-VILANTEROL 200-25 MCG/INH IN AEPB
1.0000 | INHALATION_SPRAY | Freq: Every day | RESPIRATORY_TRACT | Status: DC
  Administered 2018-11-18 – 2018-11-19 (×2): 1 via RESPIRATORY_TRACT
  Filled 2018-11-17: qty 28

## 2018-11-17 MED ORDER — LURASIDONE HCL 40 MG PO TABS
20.0000 mg | ORAL_TABLET | Freq: Every day | ORAL | Status: DC
  Administered 2018-11-18 – 2018-11-19 (×2): 20 mg via ORAL
  Filled 2018-11-17 (×3): qty 1

## 2018-11-17 MED ORDER — GABAPENTIN 300 MG PO CAPS
300.0000 mg | ORAL_CAPSULE | Freq: Two times a day (BID) | ORAL | Status: DC
  Administered 2018-11-18 – 2018-11-19 (×3): 300 mg via ORAL
  Filled 2018-11-17 (×3): qty 1

## 2018-11-17 MED ORDER — ASENAPINE MALEATE 5 MG SL SUBL
10.0000 mg | SUBLINGUAL_TABLET | Freq: Every evening | SUBLINGUAL | Status: DC
  Administered 2018-11-18 (×2): 10 mg via SUBLINGUAL
  Filled 2018-11-17 (×3): qty 2

## 2018-11-17 MED ORDER — INSULIN ASPART 100 UNIT/ML ~~LOC~~ SOLN
0.0000 [IU] | Freq: Every day | SUBCUTANEOUS | Status: DC
  Administered 2018-11-18: 21:00:00 2 [IU] via SUBCUTANEOUS
  Filled 2018-11-17: qty 1

## 2018-11-17 MED ORDER — SODIUM CHLORIDE 0.9 % IV SOLN
500.0000 mg | INTRAVENOUS | Status: DC
  Administered 2018-11-18: 16:00:00 500 mg via INTRAVENOUS
  Filled 2018-11-17 (×3): qty 500

## 2018-11-17 MED ORDER — BUPROPION HCL ER (XL) 300 MG PO TB24
300.0000 mg | ORAL_TABLET | Freq: Every day | ORAL | Status: DC
  Administered 2018-11-18 – 2018-11-19 (×2): 300 mg via ORAL
  Filled 2018-11-17 (×3): qty 1

## 2018-11-17 MED ORDER — SODIUM CHLORIDE 0.9 % IV SOLN
1.0000 g | INTRAVENOUS | Status: DC
  Administered 2018-11-18: 15:00:00 1 g via INTRAVENOUS
  Filled 2018-11-17 (×3): qty 10

## 2018-11-17 MED ORDER — PIOGLITAZONE HCL 30 MG PO TABS
30.0000 mg | ORAL_TABLET | Freq: Every day | ORAL | Status: DC
  Administered 2018-11-18 – 2018-11-19 (×2): 30 mg via ORAL
  Filled 2018-11-17 (×3): qty 1

## 2018-11-17 MED ORDER — ENOXAPARIN SODIUM 40 MG/0.4ML ~~LOC~~ SOLN
40.0000 mg | SUBCUTANEOUS | Status: DC
  Administered 2018-11-18 – 2018-11-19 (×2): 40 mg via SUBCUTANEOUS
  Filled 2018-11-17 (×2): qty 0.4

## 2018-11-17 MED ORDER — FAMOTIDINE 20 MG PO TABS
20.0000 mg | ORAL_TABLET | Freq: Two times a day (BID) | ORAL | Status: DC
  Administered 2018-11-18 – 2018-11-19 (×3): 20 mg via ORAL
  Filled 2018-11-17 (×3): qty 1

## 2018-11-17 MED ORDER — LINACLOTIDE 145 MCG PO CAPS
145.0000 ug | ORAL_CAPSULE | Freq: Every day | ORAL | Status: DC
  Administered 2018-11-18 – 2018-11-19 (×2): 145 ug via ORAL
  Filled 2018-11-17 (×3): qty 1

## 2018-11-17 NOTE — ED Notes (Addendum)
Pt changed after large liquid BM. Pt's fever remains elevated. Provider will be notified. Nurse attempted to call report but was told nurse was unavailable.

## 2018-11-17 NOTE — ED Triage Notes (Signed)
[facility] bringing pt in from fall at home. Pt was found face down in bathroom by son. Unknown downtime. Pt has slurred speech. Pt had HA on EMS arrival but has resolved. Pt stating she "passed out" this morning.

## 2018-11-17 NOTE — Progress Notes (Signed)
Family Meeting Note  Advance Directive:yes  Today a meeting took place with the Patient.  The following clinical team members were present during this meeting:MD  The following were discussed:Patient's diagnosis: Sepsis with clinical pneumonia, syncopal episode, elevated troponin, history of GERD, diabetes mellitus, essential hypertension, obstructive sleep apnea, COPD with no exacerbation, anxiety and other medical problems and treatment plan of care discussed in detail with the patient, patient verbalized understanding of the plan    patient's progosis: Unable to determine and Goals for treatment: Full Code  Son Laurina Bustle is the healthcare POA  Additional follow-up to be provided: Hospitalist  Time spent during discussion:17 MIN  Nicholes Mango, MD

## 2018-11-17 NOTE — H&P (Signed)
[facility] [place] NAME: Rachel Nielsen    MR#:  [id number]  DATE OF BIRTH:  06-Sep-1951  DATE OF ADMISSION:  11/17/2018  PRIMARY CARE PHYSICIAN: Lavera Guise, MD   REQUESTING/REFERRING PHYSICIAN: Rudene Re, MD  CHIEF COMPLAINT:  Loss of consciousness  HISTORY OF PRESENT ILLNESS:  Rachel Nielsen  is a 68 y.o. female with a known history of GERD diabetes mellitus, essential hypertension, obstructive sleep apnea, COPD, anxiety and multiple other medical problems is brought into the ED after she had a syncopal episode.  According to the EMS patient was found unresponsive facedown on the floor in the bathroom and seen by her son.  Patient has intact pulses and breathing okay.  Patient endorses previous history of passing out spells.  States that she was feeling dizzy while she was in the bathroom and could not recall what happened after that.  Endorsing dry cough and several daily episodes of watery diarrhea.  Denies any vomiting or chest pain or shortness of breath or bowel or urinary incontinence.  CT head is negative.  No family members at bedside.  Patient reports chronic dysarthria  PAST MEDICAL HISTORY:   Past Medical History:  Diagnosis Date  . Anxiety   . Asthma   . Constipation   . COPD (chronic obstructive pulmonary disease) ([place])   . Depression   . Diabetes ([place])   . GERD (gastroesophageal reflux disease)   . Hypertension   . Sleep apnea     PAST SURGICAL HISTOIRY:   Past Surgical History:  Procedure Laterality Date  . APPENDECTOMY    . CHOLECYSTECTOMY    . COLONOSCOPY WITH PROPOFOL N/A 07/20/2018   Procedure: COLONOSCOPY WITH PROPOFOL;  Surgeon: Toledo, Benay Pike, MD;  Location: [facility] ENDOSCOPY;  Service: Gastroenterology;  Laterality: N/A;  . ESOPHAGOGASTRODUODENOSCOPY N/A 07/20/2018   Procedure: ESOPHAGOGASTRODUODENOSCOPY (EGD);  Surgeon: Toledo, Benay Pike, MD;  Location: [facility] ENDOSCOPY;  Service:  Gastroenterology;  Laterality: N/A;  . GASTRECTOMY    . TUBAL LIGATION    . VAGINAL HYSTERECTOMY      SOCIAL HISTORY:   Social History   Tobacco Use  . Smoking status: Former Research scientist (life sciences)  . Smokeless tobacco: Current User    Types: Snuff  Substance Use Topics  . Alcohol use: Yes    Comment: monthly or less     FAMILY HISTORY:   Family History  Problem Relation Age of Onset  . Hypertension Mother   . Atrial fibrillation Mother   . Heart attack Mother   . Hypertension Father   . Stomach cancer Father   . Diabetes Father   . Liver cancer Father   . Diabetes Sister   . Hypertension Sister     DRUG ALLERGIES:   Allergies  Allergen Reactions  . Ace Inhibitors     REVIEW OF SYSTEMS:  CONSTITUTIONAL: No fever, fatigue or weakness.  Reporting dizzy spells EYES: No blurred or double vision.  EARS, NOSE, AND THROAT: No tinnitus or ear pain.  RESPIRATORY: Reporting cough cough, denies shortness of breath, wheezing or hemoptysis.  CARDIOVASCULAR: No chest pain, orthopnea, edema.  GASTROINTESTINAL: No nausea, vomiting, diarrhea or abdominal pain.  GENITOURINARY: No dysuria, hematuria.  ENDOCRINE: No polyuria, nocturia,  HEMATOLOGY: No anemia, easy bruising or bleeding SKIN: No rash or lesion. MUSCULOSKELETAL: No joint pain or arthritis.   NEUROLOGIC: No tingling, numbness, weakness.  Reports chronic dysarthria PSYCHIATRY: No anxiety or depression.   MEDICATIONS AT HOME:   Prior to  Admission medications   Medication Sig Start Date End Date Taking? Authorizing Provider  ALPRAZolam Duanne Moron) 0.5 MG tablet Take 0.5-1 mg by mouth See admin instructions. Take 1 tablet (0.53m) by mouth twice daily and 2 tablets (165m by mouth at bedtime   Yes [provider]  amLODipine (NORVASC) 5 MG tablet Take 1 tablet (5 mg total) by mouth daily. 06/20/18  Yes Boscia, HeGreer EeNP  bisoprolol (ZEBETA) 5 MG tablet Take 1 tablet (5 mg total) by mouth daily. 08/03/18  Yes Boscia, HeGreer Ee NP  buPROPion (WELLBUTRIN XL) 300 MG 24 hr tablet Take 300 mg by mouth daily.    Yes [provider]  busPIRone (BUSPAR) 15 MG tablet Take 15 mg by mouth 2 (two) times daily.  11/08/17  Yes [provider]  DULoxetine (CYMBALTA) 60 MG capsule Take 60 mg by mouth daily.  11/08/17  Yes [provider]  fluticasone (FLONASE) 50 MCG/ACT nasal spray Place 1 spray into both nostrils daily.   Yes [provider]  fluticasone furoate-vilanterol (BREO ELLIPTA) 200-25 MCG/INH AEPB Inhale 1 puff into the lungs daily. 09/28/18  Yes Boscia, HeGreer EeNP  gabapentin (NEURONTIN) 300 MG capsule Take 1 capsule (300 mg total) by mouth 2 (two) times daily. 06/20/18  Yes Boscia, Heather E, NP  LATUDA 20 MG TABS tablet Take 20 mg by mouth daily.  11/09/17  Yes [provider]  LINZESS 145 MCG CAPS capsule Take 1 capsule (145 mcg total) by mouth daily. Patient taking differently: Take 145 mcg by mouth daily.  11/01/18  Yes Scarboro, AdAudie ClearNP  meloxicam (MOBIC) 15 MG tablet Take 1 tablet (15 mg total) by mouth at bedtime. 11/01/18  Yes Scarboro, AdAudie ClearNP  metFORMIN (GLUCOPHAGE) 500 MG tablet Take one tab in am and 2 in pm with food Patient taking differently: Take 500-1,000 mg by mouth See admin instructions. Take 1 tablet (50033mby mouth every morning and take 2 tablets (1000m81my mouth every evening with food 10/28/18  Yes Scarboro, AdamAudie Clear  mometasone-formoterol (DULERA) 200-5 MCG/ACT AERO Inhale 1 puff into the lungs 2 (two) times daily.   Yes [provider]  Olopatadine HCl 0.2 % SOLN Place 1 drop into both eyes daily.    Yes [provider]  pantoprazole (PROTONIX) 40 MG tablet Take 1 tablet (40 mg total) by mouth daily. 11/01/18  Yes Scarboro, AdamAudie Clear  pioglitazone (ACTOS) 30 MG tablet Take 1 tablet (30 mg total) by mouth daily. 06/20/18  Yes Boscia, HeatGreer Ee  PROAIR HFA 108 (90 Base) MCG/ACT inhaler TAKE TWO PUFFS THREE TIMES A DAY. Patient  taking differently: Inhale 2 puffs into the lungs 3 (three) times daily.  10/28/18  Yes Scarboro, AdamAudie Clear  ranitidine (ZANTAC) 150 MG tablet Take 150 mg by mouth daily as needed for heartburn.    Yes [provider]  rosuvastatin (CRESTOR) 10 MG tablet Take 1 tablet (10 mg total) by mouth daily. 05/26/18  Yes Scarboro, AdamAudie Clear  SAPHRIS 10 MG SUBL Take 10 mg by mouth every evening.  12/10/17  Yes [provider]  TRAVATAN Z 0.004 % SOLN ophthalmic solution Place 1 drop into both eyes at bedtime.    Yes [provider]  TRULICITY 1.5 MG/68YO/3.7CHN inject 1.5 mg once weekly for diabetes Patient taking differently: Inject 1.5 mg into the skin every 7 (seven) days.  09/23/18  Yes ScarKendell Bane  amoxicillin-clavulanate (AUGMENTIN) 875-125 MG  tablet Take 1 tablet by mouth 2 (two) times daily. 11/03/18   Kendell Bane, NP  magic mouthwash w/lidocaine SOLN Take 5 mLs by mouth 2 (two) times daily. Use as directed 5 ml twice a day spit and swish 11/03/18   Scarboro, Audie Clear, NP  sulfamethoxazole-trimethoprim (BACTRIM DS,SEPTRA DS) 800-160 MG tablet Take 1 tablet by mouth 2 (two) times daily. 09/27/18   Kendell Bane, NP      VITAL SIGNS:  Blood pressure (!) 154/67, pulse (!) 109, temperature (!) 101.3 F (38.5 C), temperature source Oral, resp. rate (!) 25, height 5' 4"  (1.626 m), weight 104.3 kg, SpO2 99 %.  PHYSICAL EXAMINATION:  GENERAL:  68 y.o.-year-old patient lying in the bed with no acute distress.  EYES: Pupils equal, round, reactive to light and accommodation. No scleral icterus. Extraocular muscles intact.  HEENT: Head atraumatic, normocephalic. Oropharynx and nasopharynx clear.  NECK:  Supple, no jugular venous distention. No thyroid enlargement, no tenderness.  LUNGS: Moderate breath sounds bilaterally, minimal crepitations ,no wheezing, rales,rhonchi . No use of accessory muscles of respiration.  CARDIOVASCULAR: S1, S2 normal. No murmurs, rubs, or  gallops.  ABDOMEN: Soft, nontender, nondistended. Bowel sounds present.   EXTREMITIES: No pedal edema, cyanosis, or clubbing.  NEUROLOGIC: Awake, alert and oriented x3 sensation intact. Gait not checked.  PSYCHIATRIC: The patient is alert and oriented x 3.  SKIN: No obvious rash, lesion, or ulcer.   LABORATORY PANEL:   CBC Recent Labs  Lab 11/17/18 1129  WBC 19.8*  HGB 10.1*  HCT 34.5*  PLT 222   ------------------------------------------------------------------------------------------------------------------  Chemistries  Recent Labs  Lab 11/17/18 1129  NA 134*  K 3.6  CL 98  CO2 24  GLUCOSE 296*  BUN 20  CREATININE 1.17*  CALCIUM 8.2*  AST 20  ALT 13  ALKPHOS 89  BILITOT 0.9   ------------------------------------------------------------------------------------------------------------------  Cardiac Enzymes Recent Labs  Lab 11/17/18 1559  TROPONINI <0.03   ------------------------------------------------------------------------------------------------------------------  RADIOLOGY:  Dg Chest 2 View  Result Date: 11/17/2018 CLINICAL DATA:  Cough EXAM: CHEST - 2 VIEW COMPARISON:  01/23/2014 FINDINGS: Low lung volumes with bibasilar atelectasis. Heart size is accentuated by the low volumes, likely within normal limits. No visible effusions or acute bony abnormality. IMPRESSION: Low lung volumes, bibasilar atelectasis. Electronically Signed   By: Rolm Baptise M.D.   On: 11/17/2018 12:02   Ct Head Wo Contrast  Result Date: 11/17/2018 CLINICAL DATA:  Status post fall. EXAM: CT HEAD WITHOUT CONTRAST TECHNIQUE: Contiguous axial images were obtained from the base of the skull through the vertex without intravenous contrast. COMPARISON:  None. FINDINGS: Brain: No evidence of acute infarction, hemorrhage, hydrocephalus, extra-axial collection or mass lesion/mass effect. There is chronic diffuse atrophy. Chronic bilateral periventricular white matter small vessel ischemic  changes noted. Vascular: No hyperdense vessel or unexpected calcification. Skull: Normal. Negative for fracture or focal lesion. Sinuses/Orbits: No acute finding. Other: None. IMPRESSION: No focal acute intracranial abnormality identified. Chronic diffuse atrophy. Chronic bilateral periventricular white matter small vessel ischemic change. Electronically Signed   By: Abelardo Diesel M.D.   On: 11/17/2018 11:54    EKG:   Orders placed or performed during the hospital encounter of 11/17/18  . ED EKG  . ED EKG  . EKG 12-Lead  . EKG 12-Lead    IMPRESSION AND PLAN:     #Sepsis with clinical pneumonia Admit to MedSurg unit Patient met septic criteria at the time of admission with leukocytosis fever and elevated lactic acid  IV Rocephin  and azithromycin Follow-up on the culture and sensitivity IV fluids  #Elevated troponin-probably demand ischemia EKG with nonspecific changes Patient is asymptomatic with no chest pain or shortness of breath  #Hyperlipidemia continue statin  #Anxiety continue home medication of Xanax  #Diabetes mellitus-sliding scale insulin  #Chronic COPD continue home dose inhalers no exacerbation at this time  #Chronic dysarthria-continue clinical monitoring  DVT prophylaxis  All the records are reviewed and case discussed with ED provider. Management plans discussed with the patient, family and they are in agreement.  CODE STATUS: fc , son HCPOA  TOTAL TIME TAKING CARE OF THIS PATIENT: 43  minutes.   Note: This dictation was prepared with Dragon dictation along with smaller phrase technology. Any transcriptional errors that result from this process are unintentional.  Nicholes Mango M.D on 11/17/2018 at 6:19 PM  Between 7am to 6pm - Pager - [phone]  After 6pm go to [email or web address] - password [email or web address] [facility]  [facility]  Office  [phone]  CC: Primary care physician; Lavera Guise, MD

## 2018-11-17 NOTE — ED Notes (Signed)
Pt given food and helped with set up. Nurse contacted doctor about pt's fever. Pt in NAD at this time. External cath placed after cleaning pt.

## 2018-11-17 NOTE — ED Notes (Signed)
Nurse provided pt's with warm blankets on departure to CT

## 2018-11-17 NOTE — ED Provider Notes (Signed)
[facility] Emergency Department Provider Note  ____________________________________________  Time seen: Approximately 11:19 AM  I have reviewed the triage vital signs and the nursing notes.   HISTORY  Chief Complaint Loss of Consciousness   HPI Rachel Nielsen is a 68 y.o. female with a history of diabetes, hypertension, OSA, COPD, asthma who presents for evaluation of syncopal event.  According to EMS patient was found unresponsive facedown on the floor of the bathroom by her son.  She was breathing and had a pulse.  Patient reports prior history of passing out episodes.  She reports going to the bathroom and feeling dizzy and does not remember anything after that.  No urinary or bowel incontinence.  No prior history of seizure.  Patient reports that she has been sick over the last few days with a dry cough and several daily episodes of watery diarrhea.  No vomiting, no chest pain or shortness of breath, no abdominal pain, dysuria, hematuria, fever or chills.  She denies any pain from her syncopal event, denies headache, neck pain, back pain, or any extremity pain.  She is not on blood thinners.  Past Medical History:  Diagnosis Date  . Anxiety   . Asthma   . Constipation   . COPD (chronic obstructive pulmonary disease) ([place])   . Depression   . Diabetes ([place])   . GERD (gastroesophageal reflux disease)   . Hypertension   . Sleep apnea     Patient Active Problem List   Diagnosis Date Noted  . Hypertension 12/21/2017  . Type 2 diabetes mellitus with hyperglycemia ([place]) 12/21/2017  . GERD (gastroesophageal reflux disease) 12/21/2017  . CN (constipation) 12/21/2017  . Depression 12/21/2017  . Anxiety 12/21/2017    Past Surgical History:  Procedure Laterality Date  . APPENDECTOMY    . CHOLECYSTECTOMY    . COLONOSCOPY WITH PROPOFOL N/A 07/20/2018   Procedure: COLONOSCOPY WITH PROPOFOL;  Surgeon: Toledo, Benay Pike, MD;  Location: [facility] ENDOSCOPY;   Service: Gastroenterology;  Laterality: N/A;  . ESOPHAGOGASTRODUODENOSCOPY N/A 07/20/2018   Procedure: ESOPHAGOGASTRODUODENOSCOPY (EGD);  Surgeon: Toledo, Benay Pike, MD;  Location: [facility] ENDOSCOPY;  Service: Gastroenterology;  Laterality: N/A;  . GASTRECTOMY    . TUBAL LIGATION    . VAGINAL HYSTERECTOMY      Prior to Admission medications   Medication Sig Start Date End Date Taking? Authorizing Provider  ALPRAZolam Duanne Moron) 0.5 MG tablet Take 0.5-1 mg by mouth See admin instructions. Take 1 tablet (0.5mg ) by mouth twice daily and 2 tablets (1mg ) by mouth at bedtime   Yes [provider]  amLODipine (NORVASC) 5 MG tablet Take 1 tablet (5 mg total) by mouth daily. 06/20/18  Yes Boscia, Greer Ee, NP  bisoprolol (ZEBETA) 5 MG tablet Take 1 tablet (5 mg total) by mouth daily. 08/03/18  Yes Boscia, Greer Ee, NP  buPROPion (WELLBUTRIN XL) 300 MG 24 hr tablet Take 300 mg by mouth daily.    Yes [provider]  busPIRone (BUSPAR) 15 MG tablet Take 15 mg by mouth 2 (two) times daily.  11/08/17  Yes [provider]  DULoxetine (CYMBALTA) 60 MG capsule Take 60 mg by mouth daily.  11/08/17  Yes [provider]  fluticasone (FLONASE) 50 MCG/ACT nasal spray Place 1 spray into both nostrils daily.   Yes [provider]  fluticasone furoate-vilanterol (BREO ELLIPTA) 200-25 MCG/INH AEPB Inhale 1 puff into the lungs daily. 09/28/18  Yes Boscia, Greer Ee, NP  gabapentin (NEURONTIN) 300 MG capsule Take 1  capsule (300 mg total) by mouth 2 (two) times daily. 06/20/18  Yes Boscia, Heather E, NP  LATUDA 20 MG TABS tablet Take 20 mg by mouth daily.  11/09/17  Yes [provider]  LINZESS 145 MCG CAPS capsule Take 1 capsule (145 mcg total) by mouth daily. Patient taking differently: Take 145 mcg by mouth daily.  11/01/18  Yes Scarboro, Audie Clear, NP  meloxicam (MOBIC) 15 MG tablet Take 1 tablet (15 mg total) by mouth at bedtime. 11/01/18  Yes Scarboro, Audie Clear, NP  metFORMIN  (GLUCOPHAGE) 500 MG tablet Take one tab in am and 2 in pm with food Patient taking differently: Take 500-1,000 mg by mouth See admin instructions. Take 1 tablet (500mg ) by mouth every morning and take 2 tablets (1000mg ) by mouth every evening with food 10/28/18  Yes Scarboro, Audie Clear, NP  mometasone-formoterol (DULERA) 200-5 MCG/ACT AERO Inhale 1 puff into the lungs 2 (two) times daily.   Yes [provider]  Olopatadine HCl 0.2 % SOLN Place 1 drop into both eyes daily.    Yes [provider]  pantoprazole (PROTONIX) 40 MG tablet Take 1 tablet (40 mg total) by mouth daily. 11/01/18  Yes Scarboro, Audie Clear, NP  pioglitazone (ACTOS) 30 MG tablet Take 1 tablet (30 mg total) by mouth daily. 06/20/18  Yes Boscia, Greer Ee, NP  PROAIR HFA 108 (90 Base) MCG/ACT inhaler TAKE TWO PUFFS THREE TIMES A DAY. Patient taking differently: Inhale 2 puffs into the lungs 3 (three) times daily.  10/28/18  Yes Scarboro, Audie Clear, NP  ranitidine (ZANTAC) 150 MG tablet Take 150 mg by mouth daily as needed for heartburn.    Yes [provider]  rosuvastatin (CRESTOR) 10 MG tablet Take 1 tablet (10 mg total) by mouth daily. 05/26/18  Yes Scarboro, Audie Clear, NP  SAPHRIS 10 MG SUBL Take 10 mg by mouth every evening.  12/10/17  Yes [provider]  TRAVATAN Z 0.004 % SOLN ophthalmic solution Place 1 drop into both eyes at bedtime.    Yes [provider]  TRULICITY 1.5 OE/4.2PN SOPN inject 1.5 mg once weekly for diabetes Patient taking differently: Inject 1.5 mg into the skin every 7 (seven) days.  09/23/18  Yes Scarboro, Audie Clear, NP  amoxicillin-clavulanate (AUGMENTIN) 875-125 MG tablet Take 1 tablet by mouth 2 (two) times daily. 11/03/18   Kendell Bane, NP  magic mouthwash w/lidocaine SOLN Take 5 mLs by mouth 2 (two) times daily. Use as directed 5 ml twice a day spit and swish 11/03/18   Scarboro, Audie Clear, NP  sulfamethoxazole-trimethoprim (BACTRIM DS,SEPTRA DS) 800-160 MG tablet Take 1 tablet  by mouth 2 (two) times daily. 09/27/18   Kendell Bane, NP    Allergies Ace inhibitors  Family History  Problem Relation Age of Onset  . Hypertension Mother   . Atrial fibrillation Mother   . Heart attack Mother   . Hypertension Father   . Stomach cancer Father   . Diabetes Father   . Liver cancer Father   . Diabetes Sister   . Hypertension Sister     Social History Social History   Tobacco Use  . Smoking status: Former Research scientist (life sciences)  . Smokeless tobacco: Current User    Types: Snuff  Substance Use Topics  . Alcohol use: Yes    Comment: monthly or less   . Drug use: No    Review of Systems  Constitutional: Negative for fever. + syncope Eyes: Negative for visual changes. ENT:  Negative for sore throat. Neck: No neck pain  Cardiovascular: Negative for chest pain. Respiratory: Negative for shortness of breath. + cough Gastrointestinal: Negative for abdominal pain, vomiting. + diarrhea. Genitourinary: Negative for dysuria. Musculoskeletal: Negative for back pain. Skin: Negative for rash. Neurological: Negative for headaches, weakness or numbness. Psych: No SI or HI  ____________________________________________   PHYSICAL EXAM:  VITAL SIGNS: ED Triage Vitals  Enc Vitals Group     BP 11/17/18 1117 125/71     Pulse Rate 11/17/18 1117 99     Resp 11/17/18 1117 20     Temp 11/17/18 1113 99 F (37.2 C)     Temp Source 11/17/18 1113 Oral     SpO2 11/17/18 1117 96 %     Weight 11/17/18 1114 230 lb (104.3 kg)     Height 11/17/18 1114 5\' 4"  (1.626 m)     Head Circumference --      Peak Flow --      Pain Score 11/17/18 1113 2     Pain Loc --      Pain Edu? --      Excl. in [place]? --    Full spinal precautions maintained throughout the trauma exam. Constitutional: Alert and oriente. No acute distress. Does not appear intoxicated. HEENT Head: Normocephalic and atraumatic. Face: No facial bony tenderness. Stable midface Ears: No hemotympanum bilaterally. No Battle  sign Eyes: No eye injury. PERRL. No raccoon eyes Nose: Nontender. No epistaxis. No rhinorrhea Mouth/Throat: Mucous membranes are moist. No oropharyngeal blood. No dental injury. Airway patent without stridor. Normal voice. Neck: C-collar in place. No midline c-spine tenderness.  Cardiovascular: Normal rate, regular rhythm. Normal and symmetric distal pulses are present in all extremities. Pulmonary/Chest: Chest wall is stable and nontender to palpation/compression. Normal respiratory effort. Breath sounds are normal. No crepitus.  Abdominal: Soft, nontender, non distended. Musculoskeletal: Nontender with normal full range of motion in all extremities. No deformities. No thoracic or lumbar midline spinal tenderness. Pelvis is stable. Skin: Skin is warm, dry and intact. No abrasions or contutions. Psychiatric: behavior are appropriate. Neurological: Slurred speech. Face is symmetric, EOMI, 5/5 strength on b/l UE, 4/5 on b/l LE. No pronator drift, no dysmetria  Glascow Coma Score: 4 - Opens eyes on own 6 - Follows simple motor commands 5 - Alert and oriented GCS: 15  ____________________________________________   LABS (all labs ordered are listed, but only abnormal results are displayed)  Labs Reviewed  CBC WITH DIFFERENTIAL/PLATELET - Abnormal; Notable for the following components:      Result Value   WBC 19.8 (*)    Hemoglobin 10.1 (*)    HCT 34.5 (*)    MCH 24.0 (*)    MCHC 29.3 (*)    RDW 17.0 (*)    Neutro Abs 17.6 (*)    Abs Immature Granulocytes 0.21 (*)    All other components within normal limits  COMPREHENSIVE METABOLIC PANEL - Abnormal; Notable for the following components:   Sodium 134 (*)    Glucose, Bld 296 (*)    Creatinine, Ser 1.17 (*)    Calcium 8.2 (*)    GFR calc non Af Amer 49 (*)    GFR calc Af Amer 57 (*)    All other components within normal limits  TROPONIN I - Abnormal; Notable for the following components:   Troponin I 0.07 (*)    All other  components within normal limits  URINALYSIS, COMPLETE (UACMP) WITH MICROSCOPIC - Abnormal; Notable for the following components:  Color, Urine AMBER (*)    APPearance HAZY (*)    Glucose, UA 150 (*)    Ketones, ur 5 (*)    Protein, ur 100 (*)    All other components within normal limits  LACTIC ACID, PLASMA - Abnormal; Notable for the following components:   Lactic Acid, Venous 2.9 (*)    All other components within normal limits  CG4 I-STAT (LACTIC ACID) - Abnormal; Notable for the following components:   Lactic Acid, Venous 3.65 (*)    All other components within normal limits  CULTURE, BLOOD (ROUTINE X 2)  CULTURE, BLOOD (ROUTINE X 2)  INFLUENZA PANEL BY PCR (TYPE A & B)  I-STAT CG4 LACTIC ACID, ED   ____________________________________________  EKG  ED ECG REPORT I, Rudene Re, the attending physician, personally viewed and interpreted this ECG.  Normal sinus rhythm, rate of 99, normal intervals, normal axis, slight ST depressions in inferior lateral leads with no ST elevation. New when compared to prior from 2007 ____________________________________________  RADIOLOGY  I have personally reviewed the images performed during this visit and I agree with the Radiologist's read.   Interpretation by Radiologist:  Dg Chest 2 View  Result Date: 11/17/2018 CLINICAL DATA:  Cough EXAM: CHEST - 2 VIEW COMPARISON:  01/23/2014 FINDINGS: Low lung volumes with bibasilar atelectasis. Heart size is accentuated by the low volumes, likely within normal limits. No visible effusions or acute bony abnormality. IMPRESSION: Low lung volumes, bibasilar atelectasis. Electronically Signed   By: Rolm Baptise M.D.   On: 11/17/2018 12:02   Ct Head Wo Contrast  Result Date: 11/17/2018 CLINICAL DATA:  Status post fall. EXAM: CT HEAD WITHOUT CONTRAST TECHNIQUE: Contiguous axial images were obtained from the base of the skull through the vertex without intravenous contrast. COMPARISON:  None.  FINDINGS: Brain: No evidence of acute infarction, hemorrhage, hydrocephalus, extra-axial collection or mass lesion/mass effect. There is chronic diffuse atrophy. Chronic bilateral periventricular white matter small vessel ischemic changes noted. Vascular: No hyperdense vessel or unexpected calcification. Skull: Normal. Negative for fracture or focal lesion. Sinuses/Orbits: No acute finding. Other: None. IMPRESSION: No focal acute intracranial abnormality identified. Chronic diffuse atrophy. Chronic bilateral periventricular white matter small vessel ischemic change. Electronically Signed   By: Abelardo Diesel M.D.   On: 11/17/2018 11:54      ____________________________________________   PROCEDURES  Procedure(s) performed: None Procedures Critical Care performed: yes  CRITICAL CARE Performed by: Rudene Re  ?  Total critical care time: 35 min  Critical care time was exclusive of separately billable procedures and treating other patients.  Critical care was necessary to treat or prevent imminent or life-threatening deterioration.  Critical care was time spent personally by me on the following activities: development of treatment plan with patient and/or surrogate as well as nursing, discussions with consultants, evaluation of patient's response to treatment, examination of patient, obtaining history from patient or surrogate, ordering and performing treatments and interventions, ordering and review of laboratory studies, ordering and review of radiographic studies, pulse oximetry and re-evaluation of patient's condition.  ____________________________________________   INITIAL IMPRESSION / ASSESSMENT AND PLAN / ED COURSE  68 y.o. female with a history of diabetes, hypertension, OSA, COPD, asthma who presents for evaluation of syncopal event.    Ddx cardiac dysrhythmia, orthostatic hypotension, sepsis, dehydration, AKI, pneumonia, flu  Patient has slurred speech but otherwise no  obvious distress or .  According to her this is her normal voice.  Vitals show low-grade temp of 99 otherwise within normal limits.  No obvious trauma sustained based on physical and history.  Patient is complaining of diarrhea and cough for the last few days and has a low-grade fever here concerning for possible flu versus pneumonia.  Chest x-ray was done which does not show an infiltrate however patient had poor inspiratory effort during that exam.  Head CT showed no evidence of intracranial trauma.  Patient has elevated white count of 19.8, negative flu, acute kidney injury with creatinine of 1.17. Concerning for sepsis. UA pending. Will start IVF, rocephin and azithromycin for possible PNA vs UTI.  EKG showing new ST depressions in inferior leads with no ST elevations.  No signs of STEMI.  First troponin is elevated at 0.07 concerning for possible NSTEMI. Patient has no CP or SOB at this time. Will admit to Hospitalist   As part of my medical decision making, I reviewed the following data within the [place] notes reviewed and incorporated, Labs reviewed , EKG interpreted , Old EKG reviewed, Old chart reviewed, Radiograph reviewed , Discussed with admitting physician , Notes from prior ED visits and [place] Controlled Substance Database    Pertinent labs & imaging results that were available during my care of the patient were reviewed by me and considered in my medical decision making (see chart for details).    ____________________________________________   FINAL CLINICAL IMPRESSION(S) / ED DIAGNOSES  Final diagnoses:  Syncope, unspecified syncope type  NSTEMI (non-ST elevated myocardial infarction) ([place])  AKI (acute kidney injury) ([place])  Sepsis with acute renal failure without septic shock, due to unspecified organism, unspecified acute renal failure type ([place])      NEW MEDICATIONS STARTED DURING THIS VISIT:  ED Discharge Orders    None       Note:  This  document was prepared using Dragon voice recognition software and may include unintentional dictation errors.    Alfred Levins, [place], MD 11/17/18 [phone]

## 2018-11-17 NOTE — ED Notes (Signed)
Patient transported to CT 

## 2018-11-18 ENCOUNTER — Inpatient Hospital Stay: Payer: Medicare HMO

## 2018-11-18 LAB — GLUCOSE, CAPILLARY
Glucose-Capillary: 161 mg/dL — ABNORMAL HIGH (ref 70–99)
Glucose-Capillary: 168 mg/dL — ABNORMAL HIGH (ref 70–99)
Glucose-Capillary: 173 mg/dL — ABNORMAL HIGH (ref 70–99)
Glucose-Capillary: 214 mg/dL — ABNORMAL HIGH (ref 70–99)

## 2018-11-18 LAB — TROPONIN I
Troponin I: 0.13 ng/mL (ref <0.03)
Troponin I: 0.03 ng/mL (ref <0.03)
Troponin I: 0.03 ng/mL (ref <0.03)
Troponin I: 0.05 ng/mL (ref <0.03)

## 2018-11-18 LAB — CBC
HCT: 30.1 % — ABNORMAL LOW (ref 36.0–46.0)
Hemoglobin: 9.2 g/dL — ABNORMAL LOW (ref 12.0–15.0)
MCH: 24.8 pg — ABNORMAL LOW (ref 26.0–34.0)
MCHC: 30.6 g/dL (ref 30.0–36.0)
MCV: 81.1 fL (ref 80.0–100.0)
Platelets: 173 10*3/uL (ref 150–400)
RBC: 3.71 MIL/uL — ABNORMAL LOW (ref 3.87–5.11)
RDW: 17.1 % — ABNORMAL HIGH (ref 11.5–15.5)
WBC: 15.8 10*3/uL — ABNORMAL HIGH (ref 4.0–10.5)
nRBC: 0 % (ref 0.0–0.2)

## 2018-11-18 LAB — CREATININE, SERUM
Creatinine, Ser: 0.98 mg/dL (ref 0.44–1.00)
GFR calc Af Amer: >60 mL/min (ref >60)
GFR calc non Af Amer: >60 mL/min (ref >60)

## 2018-11-18 LAB — PROCALCITONIN
Procalcitonin: 3.67 ng/mL
Procalcitonin: 3.55 ng/mL

## 2018-11-18 LAB — STREP PNEUMONIAE URINARY ANTIGEN: Strep Pneumo Urinary Antigen: NEGATIVE

## 2018-11-18 NOTE — Progress Notes (Addendum)
2115 paged on call for report of 2 critical lab values. Lactic acid and troponin. No response. Re-paged after 2200. In coming on call physician, Anselm Jungling MD, responded and was notified of both values. After review MD stated to continue treatment plan which is progressively trending values closer to normal range values. Also requested treatment to cover DM with elevated CBG. Insulin orders place at that time. See mar.

## 2018-11-18 NOTE — Progress Notes (Signed)
[facility] NAME: Rachel Nielsen    MR#:  [id number]  DATE OF BIRTH:  August 09, 1951  SUBJECTIVE:   feels weak and SOB  REVIEW OF SYSTEMS:    Review of Systems  Constitutional: ++ fever, chills NO weight loss HENT: Negative for ear pain, nosebleeds, congestion, facial swelling, rhinorrhea, neck pain, neck stiffness and ear discharge.   Respiratory: ++ cough, shortness of breath, no wheezing  Cardiovascular: Negative for chest pain, palpitations and leg swelling.  Gastrointestinal: Negative for heartburn, abdominal pain, vomiting, diarrhea or consitpation Genitourinary: Negative for dysuria, urgency, frequency, hematuria Musculoskeletal: Negative for back pain or joint pain Neurological: Negative for dizziness, seizures, syncope, focal weakness,  numbness and headaches.  Hematological: Does not bruise/bleed easily.  Psychiatric/Behavioral: Negative for hallucinations, confusion, dysphoric mood    Tolerating Diet: yes      DRUG ALLERGIES:   Allergies  Allergen Reactions  . Ace Inhibitors     VITALS:  Blood pressure 132/68, pulse (!) 109, temperature 99.5 F (37.5 C), temperature source Oral, resp. rate 16, height 5\' 4"  (1.626 m), weight 104.3 kg, SpO2 96 %.  PHYSICAL EXAMINATION:  Constitutional: Appears well-developed and well-nourished. No distress. HENT: Normocephalic. Marland Kitchen Oropharynx is clear and moist.  Eyes: Conjunctivae and EOM are normal. PERRLA, no scleral icterus.  Neck: Normal ROM. Neck supple. No JVD. No tracheal deviation. CVS: RRR, S1/S2 +, no murmurs, no gallops, no carotid bruit.  Pulmonary: Effort and breath sounds normal, no stridor, rhonchi, wheezes, rales.  Abdominal: Soft. BS +,  no distension, tenderness, rebound or guarding.  Musculoskeletal: Normal range of motion. No edema and no tenderness.  Neuro: Alert. CN 2-12 grossly intact. No focal deficits. Skin: Skin is warm and dry. No rash  noted. Psychiatric: Normal mood and affect.      LABORATORY PANEL:   CBC Recent Labs  Lab 11/18/18 0820  WBC 15.8*  HGB 9.2*  HCT 30.1*  PLT 173   ------------------------------------------------------------------------------------------------------------------  Chemistries  Recent Labs  Lab 11/17/18 1129 11/17/18 2026  NA 134*  --   K 3.6  --   CL 98  --   CO2 24  --   GLUCOSE 296*  --   BUN 20  --   CREATININE 1.17* 0.98  CALCIUM 8.2*  --   AST 20  --   ALT 13  --   ALKPHOS 89  --   BILITOT 0.9  --    ------------------------------------------------------------------------------------------------------------------  Cardiac Enzymes Recent Labs  Lab 11/17/18 2016 11/18/18 0319 11/18/18 0820  TROPONINI 0.04* 0.13* 0.03*   ------------------------------------------------------------------------------------------------------------------  RADIOLOGY:  Dg Chest 1 View  Result Date: 11/18/2018 CLINICAL DATA:  Follow-up atelectasis EXAM: CHEST  1 VIEW COMPARISON:  11/17/2018 FINDINGS: Cardiac shadow is stable. Elevation of the right hemidiaphragm is noted. No focal confluent infiltrate or sizable effusion is seen. Previously seen right basilar atelectasis has resolved. No bony abnormality is noted. IMPRESSION: No acute abnormality noted. Electronically Signed   By: Inez Catalina M.D.   On: 11/18/2018 08:34   Dg Chest 2 View  Result Date: 11/17/2018 CLINICAL DATA:  Cough EXAM: CHEST - 2 VIEW COMPARISON:  01/23/2014 FINDINGS: Low lung volumes with bibasilar atelectasis. Heart size is accentuated by the low volumes, likely within normal limits. No visible effusions or acute bony abnormality. IMPRESSION: Low lung volumes, bibasilar atelectasis. Electronically Signed   By: Rolm Baptise M.D.   On: 11/17/2018 12:02   Ct Head Wo Contrast  Result Date: 11/17/2018  CLINICAL DATA:  Status post fall. EXAM: CT HEAD WITHOUT CONTRAST TECHNIQUE: Contiguous axial images were obtained  from the base of the skull through the vertex without intravenous contrast. COMPARISON:  None. FINDINGS: Brain: No evidence of acute infarction, hemorrhage, hydrocephalus, extra-axial collection or mass lesion/mass effect. There is chronic diffuse atrophy. Chronic bilateral periventricular white matter small vessel ischemic changes noted. Vascular: No hyperdense vessel or unexpected calcification. Skull: Normal. Negative for fracture or focal lesion. Sinuses/Orbits: No acute finding. Other: None. IMPRESSION: No focal acute intracranial abnormality identified. Chronic diffuse atrophy. Chronic bilateral periventricular white matter small vessel ischemic change. Electronically Signed   By: Abelardo Diesel M.D.   On: 11/17/2018 11:54     ASSESSMENT AND PLAN:   68 year old female with history of essential hypertension and diabetes who presented to the ER via EMS as she was found unresponsive at home by her son.    1.  Syncope: This is due to pneumonia and dehydration  2.  Sepsis due to community-acquired pneumonia: Patient presented with leukocytosis and tachypnea . continue Rocephin and azithromycin Follow procalcitonin level White blood cell count is improving Lactic acid level is improving Incentive spirometer  3.  Elevated troponin: This is due to demand ischemia.  Patient has ruled out for ACS.  4.  Diabetes: Continue Actos with sliding scale and ADA diet  5.  Depression: Continue Cymbalta  6.  Essential hypertension: Continue Norvasc  7.  Hyperlipidemia: Continue statin  8.  COPD without signs of exacerbation: Continue inhalers  9.  Anemia of chronic disease: Hemoglobin is stable  Physical therapy evaluation for discharge planning   Management plans discussed with the patient and she is in agreement.  CODE STATUS: full  TOTAL TIME TAKING CARE OF THIS PATIENT: 30 minutes.     POSSIBLE D/C tomorrow, DEPENDING ON CLINICAL CONDITION.   Briana Farner M.D on 11/18/2018 at 11:10  AM  Between 7am to 6pm - Pager - [phone] After 6pm go to [email or web address] - password [email or web address]  Office  [phone]  CC: Primary care physician; Lavera Guise, MD  Note: This dictation was prepared with Dragon dictation along with smaller phrase technology. Any transcriptional errors that result from this process are unintentional.

## 2018-11-18 NOTE — Progress Notes (Signed)
PT Cancellation Note  Patient Details Name: Rachel Nielsen MRN: [id number] DOB: [birth year]   Cancelled Treatment:    Reason Eval/Treat Not Completed: Other (comment)(Chart reviewed, Evaluation attempted. Pt received in bed with heavy load of watery bowel incontinence. Will attempt again at later date/time. NA made aware. )   2:34 PM, 11/18/18 Etta Grandchild, PT, DPT Physical Therapist - [facility]  [phone] ([place])     [place] C 11/18/2018, 2:34 PM

## 2018-11-19 LAB — BASIC METABOLIC PANEL
Anion gap: 8 (ref 5–15)
BUN: 18 mg/dL (ref 8–23)
CO2: 24 mmol/L (ref 22–32)
Calcium: 7.1 mg/dL — ABNORMAL LOW (ref 8.9–10.3)
Chloride: 107 mmol/L (ref 98–111)
Creatinine, Ser: 1.06 mg/dL — ABNORMAL HIGH (ref 0.44–1.00)
GFR calc Af Amer: >60 mL/min (ref >60)
GFR calc non Af Amer: 55 mL/min — ABNORMAL LOW (ref >60)
Glucose, Bld: 170 mg/dL — ABNORMAL HIGH (ref 70–99)
Potassium: 2.5 mmol/L — CL (ref 3.5–5.1)
Sodium: 139 mmol/L (ref 135–145)

## 2018-11-19 LAB — CBC
HCT: 29.6 % — ABNORMAL LOW (ref 36.0–46.0)
Hemoglobin: 8.8 g/dL — ABNORMAL LOW (ref 12.0–15.0)
MCH: 23.9 pg — ABNORMAL LOW (ref 26.0–34.0)
MCHC: 29.7 g/dL — ABNORMAL LOW (ref 30.0–36.0)
MCV: 80.4 fL (ref 80.0–100.0)
Platelets: 168 10*3/uL (ref 150–400)
RBC: 3.68 MIL/uL — ABNORMAL LOW (ref 3.87–5.11)
RDW: 17.1 % — ABNORMAL HIGH (ref 11.5–15.5)
WBC: 10.5 10*3/uL (ref 4.0–10.5)
nRBC: 0 % (ref 0.0–0.2)

## 2018-11-19 LAB — GLUCOSE, CAPILLARY
Glucose-Capillary: 160 mg/dL — ABNORMAL HIGH (ref 70–99)
Glucose-Capillary: 210 mg/dL — ABNORMAL HIGH (ref 70–99)

## 2018-11-19 LAB — C DIFFICILE QUICK SCREEN W PCR REFLEX
C Diff antigen: NEGATIVE
C Diff interpretation: NOT DETECTED
C Diff toxin: NEGATIVE

## 2018-11-19 LAB — PROCALCITONIN: Procalcitonin: 2.69 ng/mL

## 2018-11-19 LAB — HIV ANTIBODY (ROUTINE TESTING W REFLEX): HIV Screen 4th Generation wRfx: NONREACTIVE

## 2018-11-19 LAB — MAGNESIUM: Magnesium: 1.5 mg/dL — ABNORMAL LOW (ref 1.7–2.4)

## 2018-11-19 MED ORDER — METFORMIN HCL 500 MG PO TABS: 500.0000 mg | ORAL_TABLET | ORAL | Status: DC

## 2018-11-19 MED ORDER — POTASSIUM CHLORIDE CRYS ER 20 MEQ PO TBCR
40.0000 meq | EXTENDED_RELEASE_TABLET | ORAL | Status: AC
  Administered 2018-11-19 (×2): 40 meq via ORAL
  Filled 2018-11-19: qty 2

## 2018-11-19 MED ORDER — MAGNESIUM SULFATE 2 GM/50ML IV SOLN: 2.0000 g | Freq: Once | INTRAVENOUS | Status: DC

## 2018-11-19 MED ORDER — POTASSIUM CHLORIDE 10 MEQ/100ML IV SOLN
10.0000 meq | INTRAVENOUS | Status: AC
  Administered 2018-11-19: 11:00:00 10 meq via INTRAVENOUS

## 2018-11-19 MED ORDER — POTASSIUM CHLORIDE 10 MEQ/100ML IV SOLN
10.0000 meq | INTRAVENOUS | Status: DC
  Administered 2018-11-19: 10 meq via INTRAVENOUS

## 2018-11-19 MED ORDER — CEFDINIR 300 MG PO CAPS: 300.0000 mg | ORAL_CAPSULE | Freq: Two times a day (BID) | ORAL | 0 refills | Status: AC

## 2018-11-19 MED ORDER — POTASSIUM CHLORIDE CRYS ER 20 MEQ PO TBCR: 40.0000 meq | EXTENDED_RELEASE_TABLET | ORAL | Status: DC

## 2018-11-19 MED ORDER — POTASSIUM CHLORIDE 10 MEQ/100ML IV SOLN
10.0000 meq | INTRAVENOUS | Status: AC
  Administered 2018-11-19 (×2): 10 meq via INTRAVENOUS
  Filled 2018-11-19: qty 100

## 2018-11-19 NOTE — Care Management Note (Signed)
Case Management Note  Patient Details  Name: Rachel Nielsen MRN: [id number] Date of Birth: [birth year]  Subjective/Objective: Patient to be discharged per MD order. Orders in place for home health services. Patient and daughter both agreeable to home health. CMS Medicare.gov Compare Post Acute Care list reviewed with patient and they have no preference. Referral placed with [facility]. No DME needs. Daughter to transport.                    Action/Plan:   Expected Discharge Date:  11/19/18               Expected Discharge Plan:  [place]  In-House Referral:     Discharge planning Services  CM Consult  Post Acute Care Choice:  Home Health Choice offered to:  Patient, Adult Children  DME Arranged:    DME Agency:     HH Arranged:  RN, PT, Nurse's Aide [place] Agency:  [facility]  Status of Service:  Completed, signed off  If discussed at [place] of Stay Meetings, dates discussed:    Additional Comments:  Latanya Maudlin, RN 11/19/2018, 11:41 AM

## 2018-11-19 NOTE — Consult Note (Signed)
PHARMACY CONSULT NOTE - FOLLOW UP  Pharmacy Consult for Electrolyte Monitoring and Replacement   Recent Labs: Potassium (mmol/L)  Date Value  11/19/2018 2.5 (LL)   Calcium (mg/dL)  Date Value  11/19/2018 7.1 (L)   Albumin (g/dL)  Date Value  11/17/2018 3.5   Sodium (mmol/L)  Date Value  11/19/2018 139     Assessment: 68 year old female with history of essential hypertension and diabetes who presented to the ER via EMS as she was found unresponsive at home by her son. Today her labs reveal hypokalemia and hypomagnesemia  Goal of Therapy:  Electrolytes wnl  Plan:  1) hypokalemia: Will give oral KCl 40 mEq q4h x2 and a total of 40 mEq IV KCl  2) hypomagnesemia: IV magnesium sulfate 2 grams once  Dallie Piles ,PharmD Clinical Pharmacist 11/19/2018 8:13 AM

## 2018-11-19 NOTE — Discharge Summary (Signed)
[facility] NAME: Rachel Nielsen    MR#:  [id number]  DATE OF BIRTH:  [birth year]  DATE OF ADMISSION:  11/17/2018 ADMITTING PHYSICIAN: Nicholes Mango, MD  DATE OF DISCHARGE: 11/19/2018  PRIMARY CARE PHYSICIAN: Lavera Guise, MD    ADMISSION DIAGNOSIS:  NSTEMI (non-ST elevated myocardial infarction) ([place]) [I21.4] AKI (acute kidney injury) ([place]) [N17.9] Syncope, unspecified syncope type [R55] Sepsis with acute renal failure without septic shock, due to unspecified organism, unspecified acute renal failure type ([place]) [A41.9, R65.20, N17.9]  DISCHARGE DIAGNOSIS:  Active Problems:   Sepsis ([place])   SECONDARY DIAGNOSIS:   Past Medical History:  Diagnosis Date  . Anxiety   . Asthma   . Constipation   . COPD (chronic obstructive pulmonary disease) ([place])   . Depression   . Diabetes ([place])   . GERD (gastroesophageal reflux disease)   . Hypertension   . Sleep apnea     HOSPITAL COURSE:  68 year old female with history of essential hypertension and diabetes who presented to the ER via EMS as she was found unresponsive at home by her son.    1.  Syncope: This is due to pneumonia and dehydration  2.  Sepsis due to community-acquired pneumonia: Patient presented with leukocytosis and tachypnea . She was started on Rocephin and azithromycin and will be discharged on cefdinir. Pro calcitonin, lactic acid level and white blood cell count has improved.  3.  Elevated troponin: This is due to demand ischemia.  Patient has ruled out for ACS.  4.  Diabetes: She will continue Actos with  ADA diet  5.  Depression: Continue Cymbalta  6.  Essential hypertension: Continue Norvasc  7.  Hyperlipidemia: Continue statin  8.  COPD without signs of exacerbation: Continue inhalers  9.  Anemia of chronic disease: Hemoglobin is stable 10.  Diarrhea: Patient was checked for C. Difficile  11.  Electrolyte abnormalities due to diarrhea: These  were repleted prior to discharge  DISCHARGE CONDITIONS AND DIET:   Stable for discharge on heart healthy diabetic diet  CONSULTS OBTAINED:  Treatment Team:  Corey Skains, MD  DRUG ALLERGIES:   Allergies  Allergen Reactions  . Ace Inhibitors     DISCHARGE MEDICATIONS:   Allergies as of 11/19/2018      Reactions   Ace Inhibitors       Medication List    STOP taking these medications   amoxicillin-clavulanate 875-125 MG tablet Commonly known as:  AUGMENTIN   magic mouthwash w/lidocaine Soln   sulfamethoxazole-trimethoprim 800-160 MG tablet Commonly known as:  BACTRIM DS,SEPTRA DS     TAKE these medications   ALPRAZolam 0.5 MG tablet Commonly known as:  XANAX Take 0.5-1 mg by mouth See admin instructions. Take 1 tablet (0.5mg ) by mouth twice daily and 2 tablets (1mg ) by mouth at bedtime   amLODipine 5 MG tablet Commonly known as:  NORVASC Take 1 tablet (5 mg total) by mouth daily.   bisoprolol 5 MG tablet Commonly known as:  ZEBETA Take 1 tablet (5 mg total) by mouth daily.   buPROPion 300 MG 24 hr tablet Commonly known as:  WELLBUTRIN XL Take 300 mg by mouth daily.   busPIRone 15 MG tablet Commonly known as:  BUSPAR Take 15 mg by mouth 2 (two) times daily.   cefdinir 300 MG capsule Commonly known as:  OMNICEF Take 1 capsule (300 mg total) by mouth 2 (two) times daily for 2 days.   DULERA 200-5 MCG/ACT  Aero Generic drug:  mometasone-formoterol Inhale 1 puff into the lungs 2 (two) times daily.   DULoxetine 60 MG capsule Commonly known as:  CYMBALTA Take 60 mg by mouth daily.   fluticasone 50 MCG/ACT nasal spray Commonly known as:  FLONASE Place 1 spray into both nostrils daily.   fluticasone furoate-vilanterol 200-25 MCG/INH Aepb Commonly known as:  BREO ELLIPTA Inhale 1 puff into the lungs daily.   gabapentin 300 MG capsule Commonly known as:  NEURONTIN Take 1 capsule (300 mg total) by mouth 2 (two) times daily.   LATUDA 20 MG Tabs  tablet Generic drug:  lurasidone Take 20 mg by mouth daily.   LINZESS 145 MCG Caps capsule Generic drug:  linaclotide Take 1 capsule (145 mcg total) by mouth daily. What changed:  See the new instructions.   meloxicam 15 MG tablet Commonly known as:  MOBIC Take 1 tablet (15 mg total) by mouth at bedtime.   metFORMIN 500 MG tablet Commonly known as:  GLUCOPHAGE Take 1-2 tablets (500-1,000 mg total) by mouth See admin instructions. Take 1 tablet (500mg ) by mouth every morning and take 2 tablets (1000mg ) by mouth every evening with food What changed:  See the new instructions.   Olopatadine HCl 0.2 % Soln Place 1 drop into both eyes daily.   pantoprazole 40 MG tablet Commonly known as:  PROTONIX Take 1 tablet (40 mg total) by mouth daily.   pioglitazone 30 MG tablet Commonly known as:  ACTOS Take 1 tablet (30 mg total) by mouth daily.   PROAIR HFA 108 (90 Base) MCG/ACT inhaler Generic drug:  albuterol TAKE TWO PUFFS THREE TIMES A DAY. What changed:  See the new instructions.   ranitidine 150 MG tablet Commonly known as:  ZANTAC Take 150 mg by mouth daily as needed for heartburn.   rosuvastatin 10 MG tablet Commonly known as:  CRESTOR Take 1 tablet (10 mg total) by mouth daily.   SAPHRIS 10 MG Subl Generic drug:  Asenapine Maleate Take 10 mg by mouth every evening.   TRAVATAN Z 0.004 % Soln ophthalmic solution Generic drug:  Travoprost (BAK Free) Place 1 drop into both eyes at bedtime.   TRULICITY 1.5 TO/6.7TI Sopn Generic drug:  Dulaglutide inject 1.5 mg once weekly for diabetes What changed:  See the new instructions.            Durable Medical Equipment  (From admission, onward)         Start     Ordered   11/19/18 0917  For home use only DME Gilford Rile  [facility])  Once    Question:  Patient needs a walker to treat with the following condition  Answer:  Weakness   11/19/18 0917            Today   CHIEF COMPLAINT:  Patient is doing better this  morning.  Denies shortness of breath.  She had some loose stools she denies abdominal pain, nausea or vomiting.  She is tolerating her diet   VITAL SIGNS:  Blood pressure 121/80, pulse 95, temperature 98 F (36.7 C), temperature source Oral, resp. rate 16, height 5\' 4"  (1.626 m), weight 104.3 kg, SpO2 96 %.   REVIEW OF SYSTEMS:  Review of Systems  Constitutional: Negative.  Negative for chills, fever and malaise/fatigue.  HENT: Negative.  Negative for ear discharge, ear pain, hearing loss, nosebleeds and sore throat.   Eyes: Negative.  Negative for blurred vision and pain.  Respiratory: Negative.  Negative for cough, hemoptysis, shortness  of breath and wheezing.   Cardiovascular: Negative.  Negative for chest pain, palpitations and leg swelling.  Gastrointestinal: Positive for diarrhea. Negative for abdominal pain, blood in stool, nausea and vomiting.  Genitourinary: Negative.  Negative for dysuria.  Musculoskeletal: Negative.  Negative for back pain.  Skin: Negative.   Neurological: Negative for dizziness, tremors, speech change, focal weakness, seizures and headaches.  Endo/Heme/Allergies: Negative.  Does not bruise/bleed easily.  Psychiatric/Behavioral: Negative.  Negative for depression, hallucinations and suicidal ideas.     PHYSICAL EXAMINATION:  GENERAL:  68 y.o.-year-old patient lying in the bed with no acute distress.  NECK:  Supple, no jugular venous distention. No thyroid enlargement, no tenderness.  LUNGS: Normal breath sounds bilaterally, no wheezing, rales,rhonchi  No use of accessory muscles of respiration.  CARDIOVASCULAR: S1, S2 normal. No murmurs, rubs, or gallops.  ABDOMEN: Soft, non-tender, non-distended. Bowel sounds present. No organomegaly or mass.  EXTREMITIES: No pedal edema, cyanosis, or clubbing.  PSYCHIATRIC: The patient is alert and oriented x 3.  SKIN: No obvious rash, lesion, or ulcer.   DATA REVIEW:   CBC Recent Labs  Lab 11/19/18 0400  WBC  10.5  HGB 8.8*  HCT 29.6*  PLT 168    Chemistries  Recent Labs  Lab 11/17/18 1129  11/19/18 0400 11/19/18 0813  NA 134*  --  139  --   K 3.6  --  2.5*  --   CL 98  --  107  --   CO2 24  --  24  --   GLUCOSE 296*  --  170*  --   BUN 20  --  18  --   CREATININE 1.17*   < > 1.06*  --   CALCIUM 8.2*  --  7.1*  --   MG  --   --   --  1.5*  AST 20  --   --   --   ALT 13  --   --   --   ALKPHOS 89  --   --   --   BILITOT 0.9  --   --   --    < > = values in this interval not displayed.    Cardiac Enzymes Recent Labs  Lab 11/18/18 0820 11/18/18 1408 11/18/18 2001  TROPONINI 0.03* 0.03* 0.05*    Microbiology Results  @MICRORSLT48 @  RADIOLOGY:  Dg Chest 1 View  Result Date: 11/18/2018 CLINICAL DATA:  Follow-up atelectasis EXAM: CHEST  1 VIEW COMPARISON:  11/17/2018 FINDINGS: Cardiac shadow is stable. Elevation of the right hemidiaphragm is noted. No focal confluent infiltrate or sizable effusion is seen. Previously seen right basilar atelectasis has resolved. No bony abnormality is noted. IMPRESSION: No acute abnormality noted. Electronically Signed   By: Inez Catalina M.D.   On: 11/18/2018 08:34   Dg Chest 2 View  Result Date: 11/17/2018 CLINICAL DATA:  Cough EXAM: CHEST - 2 VIEW COMPARISON:  01/23/2014 FINDINGS: Low lung volumes with bibasilar atelectasis. Heart size is accentuated by the low volumes, likely within normal limits. No visible effusions or acute bony abnormality. IMPRESSION: Low lung volumes, bibasilar atelectasis. Electronically Signed   By: Rolm Baptise M.D.   On: 11/17/2018 12:02   Ct Head Wo Contrast  Result Date: 11/17/2018 CLINICAL DATA:  Status post fall. EXAM: CT HEAD WITHOUT CONTRAST TECHNIQUE: Contiguous axial images were obtained from the base of the skull through the vertex without intravenous contrast. COMPARISON:  None. FINDINGS: Brain: No evidence of acute infarction, hemorrhage, hydrocephalus, extra-axial collection or  mass lesion/mass effect.  There is chronic diffuse atrophy. Chronic bilateral periventricular white matter small vessel ischemic changes noted. Vascular: No hyperdense vessel or unexpected calcification. Skull: Normal. Negative for fracture or focal lesion. Sinuses/Orbits: No acute finding. Other: None. IMPRESSION: No focal acute intracranial abnormality identified. Chronic diffuse atrophy. Chronic bilateral periventricular white matter small vessel ischemic change. Electronically Signed   By: Abelardo Diesel M.D.   On: 11/17/2018 11:54      Allergies as of 11/19/2018      Reactions   Ace Inhibitors       Medication List    STOP taking these medications   amoxicillin-clavulanate 875-125 MG tablet Commonly known as:  AUGMENTIN   magic mouthwash w/lidocaine Soln   sulfamethoxazole-trimethoprim 800-160 MG tablet Commonly known as:  BACTRIM DS,SEPTRA DS     TAKE these medications   ALPRAZolam 0.5 MG tablet Commonly known as:  XANAX Take 0.5-1 mg by mouth See admin instructions. Take 1 tablet (0.5mg ) by mouth twice daily and 2 tablets (1mg ) by mouth at bedtime   amLODipine 5 MG tablet Commonly known as:  NORVASC Take 1 tablet (5 mg total) by mouth daily.   bisoprolol 5 MG tablet Commonly known as:  ZEBETA Take 1 tablet (5 mg total) by mouth daily.   buPROPion 300 MG 24 hr tablet Commonly known as:  WELLBUTRIN XL Take 300 mg by mouth daily.   busPIRone 15 MG tablet Commonly known as:  BUSPAR Take 15 mg by mouth 2 (two) times daily.   cefdinir 300 MG capsule Commonly known as:  OMNICEF Take 1 capsule (300 mg total) by mouth 2 (two) times daily for 2 days.   DULERA 200-5 MCG/ACT Aero Generic drug:  mometasone-formoterol Inhale 1 puff into the lungs 2 (two) times daily.   DULoxetine 60 MG capsule Commonly known as:  CYMBALTA Take 60 mg by mouth daily.   fluticasone 50 MCG/ACT nasal spray Commonly known as:  FLONASE Place 1 spray into both nostrils daily.   fluticasone furoate-vilanterol 200-25  MCG/INH Aepb Commonly known as:  BREO ELLIPTA Inhale 1 puff into the lungs daily.   gabapentin 300 MG capsule Commonly known as:  NEURONTIN Take 1 capsule (300 mg total) by mouth 2 (two) times daily.   LATUDA 20 MG Tabs tablet Generic drug:  lurasidone Take 20 mg by mouth daily.   LINZESS 145 MCG Caps capsule Generic drug:  linaclotide Take 1 capsule (145 mcg total) by mouth daily. What changed:  See the new instructions.   meloxicam 15 MG tablet Commonly known as:  MOBIC Take 1 tablet (15 mg total) by mouth at bedtime.   metFORMIN 500 MG tablet Commonly known as:  GLUCOPHAGE Take 1-2 tablets (500-1,000 mg total) by mouth See admin instructions. Take 1 tablet (500mg ) by mouth every morning and take 2 tablets (1000mg ) by mouth every evening with food What changed:  See the new instructions.   Olopatadine HCl 0.2 % Soln Place 1 drop into both eyes daily.   pantoprazole 40 MG tablet Commonly known as:  PROTONIX Take 1 tablet (40 mg total) by mouth daily.   pioglitazone 30 MG tablet Commonly known as:  ACTOS Take 1 tablet (30 mg total) by mouth daily.   PROAIR HFA 108 (90 Base) MCG/ACT inhaler Generic drug:  albuterol TAKE TWO PUFFS THREE TIMES A DAY. What changed:  See the new instructions.   ranitidine 150 MG tablet Commonly known as:  ZANTAC Take 150 mg by mouth daily as needed for heartburn.  rosuvastatin 10 MG tablet Commonly known as:  CRESTOR Take 1 tablet (10 mg total) by mouth daily.   SAPHRIS 10 MG Subl Generic drug:  Asenapine Maleate Take 10 mg by mouth every evening.   TRAVATAN Z 0.004 % Soln ophthalmic solution Generic drug:  Travoprost (BAK Free) Place 1 drop into both eyes at bedtime.   TRULICITY 1.5 TF/5.7DU Sopn Generic drug:  Dulaglutide inject 1.5 mg once weekly for diabetes What changed:  See the new instructions.            Durable Medical Equipment  (From admission, onward)         Start     Ordered   11/19/18 0917  For  home use only DME Gilford Rile  [facility])  Once    Question:  Patient needs a walker to treat with the following condition  Answer:  Weakness   11/19/18 0917             Management plans discussed with the patient and she is in agreement. Stable for discharge  Patient should follow up with pcp  CODE STATUS:     Code Status Orders  (From admission, onward)         Start     Ordered   11/17/18 2010  Full code  Continuous     11/17/18 2009        Code Status History    This patient has a current code status but no historical code status.      TOTAL TIME TAKING CARE OF THIS PATIENT: 38 minutes.    Note: This dictation was prepared with Dragon dictation along with smaller phrase technology. Any transcriptional errors that result from this process are unintentional.  Momina Hunton M.D on 11/19/2018 at 10:01 AM  Between 7am to 6pm - Pager - [phone] After 6pm go to [email or web address] - password [email or web address] [place] [facility]  Office  [phone]  CC: Primary care physician; Lavera Guise, MD

## 2018-11-19 NOTE — Progress Notes (Signed)
Patient's potassium 2.5, notified Dr. Graylon Gunning, received new orders documented in the [facility].

## 2018-11-19 NOTE — Progress Notes (Signed)
Pt D/C to home by daughter. IV removed intact. VSS. All belongings sent with pt. Education completed. All questions answered.

## 2018-11-19 NOTE — Clinical Social Work Note (Signed)
CSW received consult for "other" with no indication of need. After chart review, the CSW has concluded that the patient is not from a facility, does not yet have a PT evaluation, and does not seem to have a CSW need at this time. The CSW is signing off. Please consult should needs arise.  Santiago Bumpers, MSW, Latanya Presser [phone]

## 2018-11-19 NOTE — Plan of Care (Signed)
  Problem: Education: Goal: Knowledge of General Education information will improve Description Including pain rating scale, medication(s)/side effects and non-pharmacologic comfort measures Outcome: Progressing   Problem: Health Behavior/Discharge Planning: Goal: Ability to manage health-related needs will improve Outcome: Progressing   Problem: Clinical Measurements: Goal: Ability to maintain clinical measurements within normal limits will improve Outcome: Progressing Goal: Will remain free from infection Outcome: Progressing Goal: Diagnostic test results will improve Outcome: Progressing Goal: Respiratory complications will improve Outcome: Progressing Goal: Cardiovascular complication will be avoided Outcome: Progressing   Problem: Activity: Goal: Risk for activity intolerance will decrease Outcome: Progressing   Problem: Nutrition: Goal: Adequate nutrition will be maintained Outcome: Progressing   Problem: Coping: Goal: Level of anxiety will decrease Outcome: Progressing   Problem: Activity: Goal: Ability to tolerate increased activity will improve Outcome: Progressing   Problem: Clinical Measurements: Goal: Ability to maintain a body temperature in the normal range will improve Outcome: Progressing   Problem: Respiratory: Goal: Ability to maintain adequate ventilation will improve Outcome: Progressing Goal: Ability to maintain a clear airway will improve Outcome: Progressing   Problem: Education: Goal: Knowledge of disease or condition will improve Outcome: Progressing Goal: Knowledge of the prescribed therapeutic regimen will improve Outcome: Progressing Goal: Individualized Educational Video(s) Outcome: Progressing   Problem: Respiratory: Goal: Ability to maintain a clear airway will improve Outcome: Progressing Goal: Levels of oxygenation will improve Outcome: Progressing Goal: Ability to maintain adequate ventilation will improve Outcome:  Progressing

## 2018-11-19 NOTE — Progress Notes (Signed)
PT Cancellation Note  Patient Details Name: Rachel Nielsen MRN: [id number] DOB: 1951-02-10   Cancelled Treatment:    Reason Eval/Treat Not Completed: Other (comment);Medical issues which prohibited therapy;Patient not medically ready(K+: 2.5 this date, outside of safe range for working with PT for OOB mobility for evaluation. Will follow acutely adn attempt again at later date/time as medically appropriate.)  8:35 AM, 11/19/18 Etta Grandchild, PT, DPT Physical Therapist - [facility]  [phone] ([place])     [place] C 11/19/2018, 8:35 AM

## 2018-11-20 LAB — RESPIRATORY PANEL BY PCR

## 2018-11-21 ENCOUNTER — Telehealth: Payer: Self-pay

## 2018-11-21 ENCOUNTER — Other Ambulatory Visit: Payer: Self-pay | Admitting: Adult Health

## 2018-11-21 NOTE — Telephone Encounter (Signed)
PT [facility] NURSE CALLED TO INFORM us THAT PT IS HAVING TROUBLE WITH ANTIBIOTICS. PT WAS IN HOSPITAL AND GIVE ANTIBIOTICS THAT GAVE HER DIARRHEA. THE HOSPITAL SWITCHED THE PT ABX AND SENT HER HOME IN WITH OMNICEF AND TODAY ALONE SHE HAS HAD TO GO TO THE BATHROOM 10+ TIMES (DIARRHEA).   AN APPT WAS MADE FOR THE PT TO BE SEEN 11/22/2018 AT 10 A.M.   ADVISED WELL CARE NURSE TO HAVE PT STOP TAKING ABX AND TAKE OTC IMMODIUM FOR HER DIARRHEA AND IF PT FEELS WORSE PT SHOULD GO TO ER.

## 2018-11-22 ENCOUNTER — Other Ambulatory Visit: Payer: Self-pay

## 2018-11-22 ENCOUNTER — Telehealth: Payer: Self-pay

## 2018-11-22 ENCOUNTER — Ambulatory Visit (INDEPENDENT_AMBULATORY_CARE_PROVIDER_SITE_OTHER): Payer: Medicare HMO | Admitting: Internal Medicine

## 2018-11-22 ENCOUNTER — Encounter: Payer: Self-pay | Admitting: Internal Medicine

## 2018-11-22 DIAGNOSIS — J181 Lobar pneumonia, unspecified organism: Secondary | ICD-10-CM | POA: Diagnosis not present

## 2018-11-22 DIAGNOSIS — R55 Syncope and collapse: Secondary | ICD-10-CM

## 2018-11-22 DIAGNOSIS — E1165 Type 2 diabetes mellitus with hyperglycemia: Secondary | ICD-10-CM | POA: Diagnosis not present

## 2018-11-22 DIAGNOSIS — J449 Chronic obstructive pulmonary disease, unspecified: Secondary | ICD-10-CM

## 2018-11-22 DIAGNOSIS — R197 Diarrhea, unspecified: Secondary | ICD-10-CM

## 2018-11-22 DIAGNOSIS — B379 Candidiasis, unspecified: Secondary | ICD-10-CM

## 2018-11-22 DIAGNOSIS — J189 Pneumonia, unspecified organism: Secondary | ICD-10-CM

## 2018-11-22 LAB — CULTURE, BLOOD (ROUTINE X 2)
Culture: NO GROWTH
Culture: NO GROWTH
Special Requests: ADEQUATE
Special Requests: ADEQUATE

## 2018-11-22 LAB — ADENOVIRUS ANTIBODIES: Adenovirus Antibody: NEGATIVE

## 2018-11-22 LAB — POCT CBG (FASTING - GLUCOSE)-MANUAL ENTRY: Glucose Fasting, POC: 192 mg/dL — AB (ref 70–99)

## 2018-11-22 MED ORDER — ACCU-CHEK FASTCLIX LANCETS MISC: 3 refills | Status: DC

## 2018-11-22 MED ORDER — GLUCOSE BLOOD VI STRP: ORAL_STRIP | 3 refills | Status: DC

## 2018-11-22 MED ORDER — MOMETASONE FURO-FORMOTEROL FUM 200-5 MCG/ACT IN AERO: 1.0000 | INHALATION_SPRAY | Freq: Two times a day (BID) | RESPIRATORY_TRACT | 12 refills | Status: DC

## 2018-11-22 MED ORDER — NYSTATIN 100000 UNIT/ML MT SUSP: OROMUCOSAL | 0 refills | Status: DC

## 2018-11-22 NOTE — Telephone Encounter (Signed)
Gave verbal order spoke with Malachi Paradise [phone] from [facility] and gave verbal order for phsyical therapy and nursing for fall

## 2018-11-22 NOTE — Telephone Encounter (Signed)
Pt called that dulera not covered we did prior auth and also advised that we add basalgar and also continue trulicity and follow up in 2 weeks

## 2018-11-22 NOTE — Progress Notes (Signed)
[facility] [place], [place] [address]  Internal MEDICINE  Office Visit Note  Patient Name: Rachel Nielsen  [id number]  [id number]  Date of Service: 11/22/2018  Chief Complaint  Patient presents with  . Hospitalization Follow-up  . Hypertension  . Diabetes  . Gastroesophageal Reflux  . Quality Metric Gaps    Pneumonia shot    HPI Pt is here for recent hospital follow up. Pt was found to be on the floor, brought to [facility] by EMS. She was diagnosed to have pneumonia, syncope, feels weak, unable to walk, needs fall risk precautions. C/o diarrhea, C diff toxins was negative. Metformin was stopped. Blood sugar is 192 this am.  C/o soreness in her mouth. Confused about her meds. Son in the room   Current Medication: Outpatient Encounter Medications as of 11/22/2018  Medication Sig  . ALPRAZolam (XANAX) 0.5 MG tablet Take 0.5-1 mg by mouth See admin instructions. Take 1 tablet (0.5mg ) by mouth twice daily and 2 tablets (1mg ) by mouth at bedtime  . amLODipine (NORVASC) 5 MG tablet Take 1 tablet (5 mg total) by mouth daily.  . bisoprolol (ZEBETA) 5 MG tablet Take 1 tablet (5 mg total) by mouth daily.  Marland Kitchen buPROPion (WELLBUTRIN XL) 300 MG 24 hr tablet Take 300 mg by mouth daily.   . busPIRone (BUSPAR) 15 MG tablet Take 15 mg by mouth 2 (two) times daily.   . DULoxetine (CYMBALTA) 60 MG capsule Take 60 mg by mouth daily.   . fluticasone (FLONASE) 50 MCG/ACT nasal spray Place 1 spray into both nostrils daily.  . fluticasone furoate-vilanterol (BREO ELLIPTA) 200-25 MCG/INH AEPB Inhale 1 puff into the lungs daily.  Marland Kitchen gabapentin (NEURONTIN) 300 MG capsule Take 1 capsule (300 mg total) by mouth 2 (two) times daily.  Marland Kitchen LATUDA 20 MG TABS tablet Take 20 mg by mouth daily.   . meloxicam (MOBIC) 15 MG tablet Take 1 tablet (15 mg total) by mouth at bedtime.  . metFORMIN (GLUCOPHAGE) 500 MG tablet Take 1-2 tablets (500-1,000 mg total) by mouth See admin instructions. Take 1  tablet (500mg ) by mouth every morning and take 2 tablets (1000mg ) by mouth every evening with food  . mometasone-formoterol (DULERA) 200-5 MCG/ACT AERO Inhale 1 puff into the lungs 2 (two) times daily. Rinse mouth after each use  . Olopatadine HCl 0.2 % SOLN Place 1 drop into both eyes daily.   . pantoprazole (PROTONIX) 40 MG tablet Take 1 tablet (40 mg total) by mouth daily.  Marland Kitchen PROAIR HFA 108 (90 Base) MCG/ACT inhaler TAKE TWO PUFFS THREE TIMES A DAY. (Patient taking differently: Inhale 2 puffs into the lungs 3 (three) times daily. )  . ranitidine (ZANTAC) 150 MG tablet Take 150 mg by mouth daily as needed for heartburn.   . rosuvastatin (CRESTOR) 10 MG tablet Take 1 tablet (10 mg total) by mouth daily.  Marland Kitchen SAPHRIS 10 MG SUBL Take 10 mg by mouth every evening.   . TRAVATAN Z 0.004 % SOLN ophthalmic solution Place 1 drop into both eyes at bedtime.   . TRULICITY 1.5 EQ/6.8TM SOPN inject 1.5 mg once weekly for diabetes  . [DISCONTINUED] ACCU-CHEK FASTCLIX LANCETS MISC Use as directed twice daily diag e11.65  . [DISCONTINUED] glucose blood (ACCU-CHEK GUIDE) test strip Use twice a daily diag e11.65  . [DISCONTINUED] LINZESS 145 MCG CAPS capsule Take 1 capsule (145 mcg total) by mouth daily. (Patient taking differently: Take 145 mcg by mouth daily. )  . [DISCONTINUED] mometasone-formoterol (DULERA)  200-5 MCG/ACT AERO Inhale 1 puff into the lungs 2 (two) times daily.  . [DISCONTINUED] pioglitazone (ACTOS) 30 MG tablet Take 1 tablet (30 mg total) by mouth daily.  Marland Kitchen nystatin (MYCOSTATIN) 100000 UNIT/ML suspension 5 ml at night swish and swallow   No facility-administered encounter medications on file as of 11/22/2018.     Surgical History: Past Surgical History:  Procedure Laterality Date  . APPENDECTOMY    . CHOLECYSTECTOMY    . COLONOSCOPY WITH PROPOFOL N/A 07/20/2018   Procedure: COLONOSCOPY WITH PROPOFOL;  Surgeon: Toledo, Benay Pike, MD;  Location: [facility] ENDOSCOPY;  Service: Gastroenterology;   Laterality: N/A;  . ESOPHAGOGASTRODUODENOSCOPY N/A 07/20/2018   Procedure: ESOPHAGOGASTRODUODENOSCOPY (EGD);  Surgeon: Toledo, Benay Pike, MD;  Location: [facility] ENDOSCOPY;  Service: Gastroenterology;  Laterality: N/A;  . GASTRECTOMY    . TUBAL LIGATION    . VAGINAL HYSTERECTOMY      Medical History: Past Medical History:  Diagnosis Date  . Anxiety   . Asthma   . Constipation   . COPD (chronic obstructive pulmonary disease) ([place])   . Depression   . Diabetes ([place])   . GERD (gastroesophageal reflux disease)   . Hypertension   . Sleep apnea     Family History: Family History  Problem Relation Age of Onset  . Hypertension Mother   . Atrial fibrillation Mother   . Heart attack Mother   . Hypertension Father   . Stomach cancer Father   . Diabetes Father   . Liver cancer Father   . Diabetes Sister   . Hypertension Sister     Social History   Socioeconomic History  . Marital status: Legally Separated    Spouse name: Not on file  . Number of children: Not on file  . Years of education: Not on file  . Highest education level: Not on file  Occupational History  . Not on file  Social Needs  . Financial resource strain: Not on file  . Food insecurity:    Worry: Not on file    Inability: Not on file  . Transportation needs:    Medical: Not on file    Non-medical: Not on file  Tobacco Use  . Smoking status: Current Some Day Smoker    Types: Cigarettes  . Smokeless tobacco: Current User    Types: Snuff  . Tobacco comment: 4 Cigarattes a day  Substance and Sexual Activity  . Alcohol use: Yes    Comment: monthly or less   . Drug use: No  . Sexual activity: Not on file  Lifestyle  . Physical activity:    Days per week: Not on file    Minutes per session: Not on file  . Stress: Not on file  Relationships  . Social connections:    Talks on phone: Not on file    Gets together: Not on file    Attends religious service: Not on file    Active member of club or  organization: Not on file    Attends meetings of clubs or organizations: Not on file    Relationship status: Not on file  . Intimate partner violence:    Fear of current or ex partner: Not on file    Emotionally abused: Not on file    Physically abused: Not on file    Forced sexual activity: Not on file  Other Topics Concern  . Not on file  Social History Narrative  . Not on file   Review of Systems  Constitutional: Negative  for chills, diaphoresis and fatigue.  HENT: Negative for ear pain, postnasal drip and sinus pressure.   Eyes: Negative for photophobia, discharge, redness, itching and visual disturbance.  Respiratory: Negative for cough, shortness of breath and wheezing.   Cardiovascular: Negative for chest pain, palpitations and leg swelling.  Gastrointestinal: Positive for diarrhea. Negative for abdominal pain, constipation, nausea and vomiting.  Genitourinary: Negative for dysuria and flank pain.  Musculoskeletal: Negative for arthralgias, back pain, gait problem and neck pain.  Skin: Negative for color change.  Allergic/Immunologic: Negative for environmental allergies and food allergies.  Neurological: Positive for tremors, speech difficulty, weakness and light-headedness. Negative for dizziness and headaches.  Hematological: Does not bruise/bleed easily.  Psychiatric/Behavioral: Negative for agitation, behavioral problems (depression) and hallucinations.   Vital Signs: BP (!) 166/86   Pulse (!) 106   Temp 98.9 F (37.2 C)   Resp 16   Ht 5\' 4"  (1.626 m)   Wt 232 lb (105.2 kg)   SpO2 96%   BMI 39.82 kg/m   Physical Exam Constitutional:      General: She is not in acute distress.    Appearance: She is well-developed. She is not diaphoretic.  HENT:     Head: Normocephalic and atraumatic.     Mouth/Throat:     Pharynx: No oropharyngeal exudate.  Eyes:     Pupils: Pupils are equal, round, and reactive to light.  Neck:     Musculoskeletal: Normal range of motion  and neck supple.     Thyroid: No thyromegaly.     Vascular: No JVD.     Trachea: No tracheal deviation.  Cardiovascular:     Rate and Rhythm: Normal rate and regular rhythm.     Heart sounds: Normal heart sounds. No murmur. No friction rub. No gallop.   Pulmonary:     Effort: Pulmonary effort is normal. No respiratory distress.     Breath sounds: No wheezing or rales.  Chest:     Chest wall: No tenderness.  Abdominal:     General: Bowel sounds are normal.     Palpations: Abdomen is soft.  Musculoskeletal: Normal range of motion.  Lymphadenopathy:     Cervical: No cervical adenopathy.  Skin:    General: Skin is warm and dry.  Neurological:     Mental Status: She is alert and oriented to person, place, and time.     Cranial Nerves: No cranial nerve deficit.     Motor: Weakness present.     Coordination: Coordination abnormal.     Gait: Gait abnormal.  Psychiatric:        Behavior: Behavior normal.        Thought Content: Thought content normal.        Judgment: Judgment normal.    Assessment/Plan: 1. Syncope, unspecified syncope type Pt is at risk of falls due to her abnormal gait, ( side effects of antipsychotic meds) will need to see neurology, consult next visit, Add home health PT, shower seat and railings, rolling walker, etc  2. Uncontrolled type 2 diabetes mellitus with hyperglycemia ([place]) - Add Basalgar 6-8 units once her PO intake is increased, DC metfromin, Actos for now, continue Trulicity, will need to add Amaryl 1 or 2 mg if postprandial hyperglycemia occurs - POCT CBG (Fasting - Glucose)  3. Pneumonia of both lower lobes due to infectious organism [facility])  -Improving, will need vaccine  4. Diarrhea, unspecified type - Improved, avoid all abx at this time. DC Linzess  5. Candidiasis - She  is on [place] both, will DC breo, add nystatin swish and swallow   6. COPD mixed type ([place]) - Continue Dulera  General Counseling: trinette vera understanding  of the findings of todays visit and agrees with plan of treatment. I have discussed any further diagnostic evaluation that may be needed or ordered today. We also reviewed her medications today. she has been encouraged to call the office with any questions or concerns that should arise related to todays visit.  Orders Placed This Encounter  Procedures  . POCT CBG (Fasting - Glucose)    I have reviewed all medical records from hospital follow up including radiology reports and consults from other physicians. Appropriate follow up diagnostics will be scheduled as needed. Patient/ Family understands the plan of treatment. Time spent 25 minutes.   Dr Lavera Guise, MD Internal Medicine

## 2018-11-23 ENCOUNTER — Telehealth: Payer: Self-pay | Admitting: Internal Medicine

## 2018-11-23 NOTE — Telephone Encounter (Signed)
Prior auth for Rachel Nielsen has been approved by insurance , Pharmacy notified, spoke to [place] at [facility].

## 2018-11-24 ENCOUNTER — Telehealth: Payer: Self-pay

## 2018-11-24 NOTE — Telephone Encounter (Signed)
Pt called that she having bad constipation  as per dr Humphrey Rolls advised pt she can take linzess

## 2018-12-01 ENCOUNTER — Telehealth: Payer: Self-pay

## 2018-12-01 NOTE — Telephone Encounter (Signed)
Gave verbal order to stephanie [phone] for order OT for 1 times a week for 1 week 2  Times a week for 1 times a week for 1 week

## 2018-12-02 ENCOUNTER — Telehealth: Payer: Self-pay

## 2018-12-02 ENCOUNTER — Other Ambulatory Visit: Payer: Self-pay

## 2018-12-02 MED ORDER — INSULIN PEN NEEDLE 32G X 4 MM MISC: 2 refills | Status: DC

## 2018-12-02 MED ORDER — BASAGLAR KWIKPEN 100 UNIT/ML ~~LOC~~ SOPN: 6.0000 [IU] | PEN_INJECTOR | Freq: Every day | SUBCUTANEOUS | 1 refills | Status: DC

## 2018-12-02 NOTE — Telephone Encounter (Signed)
Pt called today still constipation and so as dr Humphrey Rolls advised take 2 linzess a day for just one day as per pt she already take 1 pill so advised her take her one more and go back to normal once day   and also increased basalgar to 12 units a day and try to call advanced home care nurse her phone is not reaching and also I spoke with PT to tell nurse to call us  also advised pt if she having any stomach pain,nausea vomiting need to go to ER and also if still constipated need to been asap

## 2018-12-02 NOTE — Telephone Encounter (Signed)
Spoke with Yvetta Coder [phone] well care spoke with her and advised her as per dr Humphrey Rolls she can take basaglar 12 units and linzess  145 mcg 2 tab today but already take 1 pill so she take another 1 pill today and 1 daily from tomorrow alsi she had any symptoms of stomach ache,nausea and vomiting need to go Er and she still constipated need to been seen

## 2018-12-02 NOTE — Telephone Encounter (Signed)
Error

## 2018-12-06 ENCOUNTER — Telehealth: Payer: Self-pay | Admitting: Internal Medicine

## 2018-12-06 ENCOUNTER — Telehealth: Payer: Self-pay

## 2018-12-06 NOTE — Telephone Encounter (Signed)
The drug you asked for is non-formulary (not on [employer] list of preferred drugs). Before the drug can be covered, we need more information. Please ask your prescriber to explain to [facility] why the preferred drugs have not worked for your medical condition and/or would have bad side effects. The list of drugs offered by your [facility] plan can be found in your Prescription Drug Guide (PDG). You can view your PDG online at [email or web address], or request a printed list be mailed to you from this website or by calling customer care at [phone] (TTY: [phone]), 8 a.m. 8 p.m., seven days a week. If you have not tried the preferred drugs, including Tresiba FlexTouch U-100 subcutaneous insulin pen, please talk to your health care provider about prescribing one of these for you. Some preferred drugs may require an additional review and approval by [facility]. This determination was based on the [employer] Non-Formulary Exceptions Coverage Policy.

## 2018-12-06 NOTE — Telephone Encounter (Signed)
Spoke with patient to inform her that we are currently working on prior authorization for her insulin and we have some samples for her to pick up until we get approval for her medication, her son stated that he will try to find a way to get here to pick up the samples.

## 2018-12-09 ENCOUNTER — Telehealth: Payer: Self-pay | Admitting: Internal Medicine

## 2018-12-09 NOTE — Telephone Encounter (Signed)
Appeal  For medication Basaglar 100 unit  Has been approved by insurance , pharmacy contacted. Good through 12/08/2018-11/02/2019

## 2018-12-12 ENCOUNTER — Telehealth: Payer: Self-pay

## 2018-12-12 NOTE — Telephone Encounter (Signed)
Send message to dr Humphrey Rolls well care nurse called that pt glucose high in 200 she like to if we can gave her sliding scale on visit tomorrow

## 2018-12-13 ENCOUNTER — Telehealth: Payer: Self-pay

## 2018-12-13 ENCOUNTER — Ambulatory Visit: Payer: Self-pay | Admitting: Internal Medicine

## 2018-12-13 NOTE — Telephone Encounter (Signed)
Pt advised increase basalgar see other note

## 2018-12-13 NOTE — Telephone Encounter (Signed)
Pt advised increase basaglar to 14 units a daily and need to make appt and also advised [facility]care nurse that we increase basalgar to 14 unit to [place] [phone]

## 2018-12-16 ENCOUNTER — Telehealth: Payer: Self-pay | Admitting: Internal Medicine

## 2018-12-16 MED ORDER — FLUTICASONE-SALMETEROL 250-50 MCG/DOSE IN AEPB: 1.0000 | INHALATION_SPRAY | Freq: Two times a day (BID) | RESPIRATORY_TRACT | 3 refills | Status: DC

## 2018-12-16 NOTE — Telephone Encounter (Signed)
[place] contacted [place] regarding changing dulera to fluticasone salmetrol inhaler, per Dr Humphrey Rolls ok to do ,

## 2018-12-23 ENCOUNTER — Ambulatory Visit: Payer: Self-pay | Admitting: Adult Health

## 2018-12-27 ENCOUNTER — Ambulatory Visit: Payer: Self-pay | Admitting: Adult Health

## 2018-12-29 ENCOUNTER — Other Ambulatory Visit: Payer: Self-pay

## 2018-12-29 ENCOUNTER — Other Ambulatory Visit: Payer: Self-pay | Admitting: Adult Health

## 2018-12-29 MED ORDER — GABAPENTIN 300 MG PO CAPS: 300.0000 mg | ORAL_CAPSULE | Freq: Two times a day (BID) | ORAL | 0 refills | Status: DC

## 2018-12-29 MED ORDER — AMLODIPINE BESYLATE 5 MG PO TABS: 5.0000 mg | ORAL_TABLET | Freq: Every day | ORAL | 5 refills | Status: DC

## 2019-01-03 ENCOUNTER — Ambulatory Visit (INDEPENDENT_AMBULATORY_CARE_PROVIDER_SITE_OTHER): Payer: Medicare HMO | Admitting: Adult Health

## 2019-01-03 ENCOUNTER — Encounter: Payer: Self-pay | Admitting: Adult Health

## 2019-01-03 VITALS — BP 142/96 | HR 85 | Resp 16 | Ht 64.0 in | Wt 232.0 lb

## 2019-01-03 DIAGNOSIS — Z6841 Body Mass Index (BMI) 40.0 and over, adult: Secondary | ICD-10-CM | POA: Diagnosis not present

## 2019-01-03 DIAGNOSIS — K5900 Constipation, unspecified: Secondary | ICD-10-CM

## 2019-01-03 DIAGNOSIS — E1165 Type 2 diabetes mellitus with hyperglycemia: Secondary | ICD-10-CM | POA: Diagnosis not present

## 2019-01-03 DIAGNOSIS — I10 Essential (primary) hypertension: Secondary | ICD-10-CM | POA: Diagnosis not present

## 2019-01-03 DIAGNOSIS — K219 Gastro-esophageal reflux disease without esophagitis: Secondary | ICD-10-CM

## 2019-01-03 MED ORDER — LINACLOTIDE 290 MCG PO CAPS: 290.0000 ug | ORAL_CAPSULE | Freq: Every day | ORAL | 3 refills | Status: DC

## 2019-01-03 MED ORDER — BISOPROLOL FUMARATE 10 MG PO TABS: 10.0000 mg | ORAL_TABLET | Freq: Every day | ORAL | 3 refills | Status: DC

## 2019-01-03 MED ORDER — BASAGLAR KWIKPEN 100 UNIT/ML ~~LOC~~ SOPN: 10.0000 [IU] | PEN_INJECTOR | Freq: Every day | SUBCUTANEOUS | 1 refills | Status: DC

## 2019-01-03 NOTE — Patient Instructions (Signed)

## 2019-01-03 NOTE — Progress Notes (Signed)
[facility] [place], [place] [address]  Internal MEDICINE  Office Visit Note  Patient Name: Rachel Nielsen  [id number]  [id number]  Date of Service: 01/03/2019  Chief Complaint  Patient presents with  . Diabetes    bs still running high   . Hypertension    bp elevated   . Depression  . Fecal Impaction    its been a week ago , been using alot of stool softners, miralax for a couple of days     HPI Pt is here for follow on Diabetes, HTN, depression and constipation. She reports he blood sugars have been elevted over 220 regularly. She has not changed her medications.  She reports she has been taking them regularly.  She has altered her carbohydrate intake in an attempt to help reduce her blood sugar and she has seen mild results. Her depression is well controlled and she denies any issues with her blood pressure currently. Her major complaint centers around her constipation.  She reports one week since substantial bowel movement. She has been able to produce some stool. She is currently using colace, and linzess.       Current Medication: Outpatient Encounter Medications as of 01/03/2019  Medication Sig  . ACCU-CHEK FASTCLIX LANCETS MISC Use as directed twice daily diag e11.65  . ALPRAZolam (XANAX) 0.5 MG tablet Take 0.5-1 mg by mouth See admin instructions. Take 1 tablet (0.5mg ) by mouth twice daily and 2 tablets (1mg ) by mouth at bedtime  . amLODipine (NORVASC) 5 MG tablet Take 1 tablet (5 mg total) by mouth daily.  . bisoprolol (ZEBETA) 5 MG tablet Take 1 tablet (5 mg total) by mouth daily.  Marland Kitchen buPROPion (WELLBUTRIN XL) 300 MG 24 hr tablet Take 300 mg by mouth daily.   . busPIRone (BUSPAR) 15 MG tablet Take 15 mg by mouth 2 (two) times daily.   . DULoxetine (CYMBALTA) 60 MG capsule Take 60 mg by mouth daily.   . fluticasone (FLONASE) 50 MCG/ACT nasal spray Place 1 spray into both nostrils daily.  . fluticasone furoate-vilanterol (BREO ELLIPTA) 200-25 MCG/INH  AEPB Inhale 1 puff into the lungs daily.  . Fluticasone-Salmeterol (ADVAIR) 250-50 MCG/DOSE AEPB Inhale 1 puff into the lungs 2 (two) times daily. Inhale 1 puff into the lungs 2 (two) times daily. Rinse mouth after each use  . gabapentin (NEURONTIN) 300 MG capsule Take 1 capsule (300 mg total) by mouth 2 (two) times daily.  Marland Kitchen glucose blood (ACCU-CHEK GUIDE) test strip Use twice a daily diag e11.65  . Insulin Glargine (BASAGLAR KWIKPEN) 100 UNIT/ML SOPN Inject 0.06-0.12 mLs (6-12 Units total) into the skin daily.  . Insulin Pen Needle 32G X 4 MM MISC Use as directed with insulin  . LATUDA 20 MG TABS tablet Take 20 mg by mouth daily.   Marland Kitchen LINZESS 145 MCG CAPS capsule Take 1 capsule (145 mcg total) by mouth daily.  . meloxicam (MOBIC) 15 MG tablet Take 1 tablet (15 mg total) by mouth at bedtime.  . mometasone-formoterol (DULERA) 200-5 MCG/ACT AERO Inhale 1 puff into the lungs 2 (two) times daily. Rinse mouth after each use  . nystatin (MYCOSTATIN) 100000 UNIT/ML suspension 5 ml at night swish and swallow  . Olopatadine HCl 0.2 % SOLN Place 1 drop into both eyes daily.   . pantoprazole (PROTONIX) 40 MG tablet Take 1 tablet (40 mg total) by mouth daily.  Marland Kitchen PROAIR HFA 108 (90 Base) MCG/ACT inhaler TAKE TWO PUFFS THREE TIMES A DAY. (Patient  taking differently: Inhale 2 puffs into the lungs 3 (three) times daily. )  . ranitidine (ZANTAC) 150 MG tablet Take 150 mg by mouth daily as needed for heartburn.   . rosuvastatin (CRESTOR) 10 MG tablet Take 1 tablet (10 mg total) by mouth daily.  Marland Kitchen SAPHRIS 10 MG SUBL Take 10 mg by mouth every evening.   . TRAVATAN Z 0.004 % SOLN ophthalmic solution Place 1 drop into both eyes at bedtime.   . TRULICITY 1.5 XN/1.7GY SOPN inject 1.5 mg once weekly for diabetes  . [DISCONTINUED] metFORMIN (GLUCOPHAGE) 500 MG tablet Take 1-2 tablets (500-1,000 mg total) by mouth See admin instructions. Take 1 tablet (500mg ) by mouth every morning and take 2 tablets (1000mg ) by mouth every  evening with food   No facility-administered encounter medications on file as of 01/03/2019.     Surgical History: Past Surgical History:  Procedure Laterality Date  . APPENDECTOMY    . CHOLECYSTECTOMY    . COLONOSCOPY WITH PROPOFOL N/A 07/20/2018   Procedure: COLONOSCOPY WITH PROPOFOL;  Surgeon: Toledo, Benay Pike, MD;  Location: [facility];  Service: Gastroenterology;  Laterality: N/A;  . ESOPHAGOGASTRODUODENOSCOPY N/A 07/20/2018   Procedure: ESOPHAGOGASTRODUODENOSCOPY (EGD);  Surgeon: Toledo, Benay Pike, MD;  Location: [facility];  Service: Gastroenterology;  Laterality: N/A;  . GASTRECTOMY    . TUBAL LIGATION    . VAGINAL HYSTERECTOMY      Medical History: Past Medical History:  Diagnosis Date  . Anxiety   . Asthma   . Constipation   . COPD (chronic obstructive pulmonary disease) ([place])   . Depression   . Diabetes ([place])   . GERD (gastroesophageal reflux disease)   . Hypertension   . Sleep apnea     Family History: Family History  Problem Relation Age of Onset  . Hypertension Mother   . Atrial fibrillation Mother   . Heart attack Mother   . Hypertension Father   . Stomach cancer Father   . Diabetes Father   . Liver cancer Father   . Diabetes Sister   . Hypertension Sister     Social History   Socioeconomic History  . Marital status: Legally Separated    Spouse name: Not on file  . Number of children: Not on file  . Years of education: Not on file  . Highest education level: Not on file  Occupational History  . Not on file  Social Needs  . Financial resource strain: Not on file  . Food insecurity:    Worry: Not on file    Inability: Not on file  . Transportation needs:    Medical: Not on file    Non-medical: Not on file  Tobacco Use  . Smoking status: Current Some Day Smoker    Types: Cigarettes  . Smokeless tobacco: Current User    Types: Snuff  . Tobacco comment: 4 Cigarattes a day  Substance and Sexual Activity  . Alcohol use: Yes     Comment: monthly or less   . Drug use: No  . Sexual activity: Not on file  Lifestyle  . Physical activity:    Days per week: Not on file    Minutes per session: Not on file  . Stress: Not on file  Relationships  . Social connections:    Talks on phone: Not on file    Gets together: Not on file    Attends religious service: Not on file    Active member of club or organization: Not on file  Attends meetings of clubs or organizations: Not on file    Relationship status: Not on file  . Intimate partner violence:    Fear of current or ex partner: Not on file    Emotionally abused: Not on file    Physically abused: Not on file    Forced sexual activity: Not on file  Other Topics Concern  . Not on file  Social History Narrative  . Not on file      Review of Systems  Constitutional: Negative for chills, fatigue and unexpected weight change.  HENT: Negative for congestion, rhinorrhea, sneezing and sore throat.   Eyes: Negative for photophobia, pain and redness.  Respiratory: Negative for cough, chest tightness and shortness of breath.   Cardiovascular: Negative for chest pain and palpitations.  Gastrointestinal: Negative for abdominal pain, constipation, diarrhea, nausea and vomiting.  Endocrine: Negative.   Genitourinary: Negative for dysuria and frequency.  Musculoskeletal: Negative for arthralgias, back pain, joint swelling and neck pain.  Skin: Negative for rash.  Allergic/Immunologic: Negative.   Neurological: Negative for tremors and numbness.  Hematological: Negative for adenopathy. Does not bruise/bleed easily.  Psychiatric/Behavioral: Negative for behavioral problems and sleep disturbance. The patient is not nervous/anxious.     Vital Signs: BP (!) 142/96   Pulse 85   Resp 16   Ht 5\' 4"  (1.626 m)   Wt 232 lb (105.2 kg)   SpO2 97%   BMI 39.82 kg/m    Physical Exam Vitals signs and nursing note reviewed.  Constitutional:      General: She is not in acute  distress.    Appearance: She is well-developed. She is not diaphoretic.  HENT:     Head: Normocephalic and atraumatic.     Mouth/Throat:     Pharynx: No oropharyngeal exudate.  Eyes:     Pupils: Pupils are equal, round, and reactive to light.  Neck:     Musculoskeletal: Normal range of motion and neck supple.     Thyroid: No thyromegaly.     Vascular: No JVD.     Trachea: No tracheal deviation.  Cardiovascular:     Rate and Rhythm: Normal rate and regular rhythm.     Heart sounds: Normal heart sounds. No murmur. No friction rub. No gallop.   Pulmonary:     Effort: Pulmonary effort is normal. No respiratory distress.     Breath sounds: Normal breath sounds. No wheezing or rales.  Chest:     Chest wall: No tenderness.  Abdominal:     Palpations: Abdomen is soft.     Tenderness: There is no abdominal tenderness. There is no guarding.  Musculoskeletal: Normal range of motion.  Lymphadenopathy:     Cervical: No cervical adenopathy.  Skin:    General: Skin is warm and dry.  Neurological:     Mental Status: She is alert and oriented to person, place, and time.     Cranial Nerves: No cranial nerve deficit.  Psychiatric:        Behavior: Behavior normal.        Thought Content: Thought content normal.        Judgment: Judgment normal.    Assessment/Plan: 1. Uncontrolled type 2 diabetes mellitus with hyperglycemia (HCC) Increased Basaglar to 14 units this week.  May go up to 16 units next week if BS remains elevated.  - Insulin Glargine (BASAGLAR KWIKPEN) 100 UNIT/ML SOPN; Inject 0.1-0.16 mLs (10-16 Units total) into the skin daily.  Dispense: 5 pen; Refill: 1  2. Hypertension,  unspecified type Increased Bisoprolol to 10mg  daily.   - bisoprolol (ZEBETA) 10 MG tablet; Take 1 tablet (10 mg total) by mouth daily.  Dispense: 30 tablet; Refill: 3  3. Class 3 severe obesity due to excess calories with serious comorbidity and body mass index (BMI) of 40.0 to 44.9 in adult [facility]) Obesity  Counseling: Risk Assessment: An assessment of behavioral risk factors was made today and includes lack of exercise sedentary lifestyle, lack of portion control and poor dietary habits.  Risk Modification Advice: She was counseled on portion control guidelines. Restricting daily caloric intake to. . The detrimental long term effects of obesity on her health and ongoing poor compliance was also discussed with the patient.  4. Gastroesophageal reflux disease without esophagitis Stable, continue present therapy.   5. Constipation, unspecified constipation type Given samples of linzess 238mcg.  As well as probiotic and miralax.  Instructed patient to get fleets enema, and colace from drug store.  RTC if no results.   General Counseling: anamarie hunn understanding of the findings of todays visit and agrees with plan of treatment. I have discussed any further diagnostic evaluation that may be needed or ordered today. We also reviewed her medications today. she has been encouraged to call the office with any questions or concerns that should arise related to todays visit.    No orders of the defined types were placed in this encounter.   No orders of the defined types were placed in this encounter.   Time spent: 25 Minutes   This patient was seen by Orson Gear AGNP-C in Collaboration with Dr Lavera Guise as a part of collaborative care agreement     Kendell Bane AGNP-C Internal medicine

## 2019-01-05 ENCOUNTER — Telehealth: Payer: Self-pay

## 2019-01-05 NOTE — Telephone Encounter (Signed)
[facility] nurse  Arnoldo Morale called pt not having bowel advised nurse adam  Given samples of linzess 228mcg.  As well as probiotic and miralax.  Instructed patient to get fleets enema, and colace from drug store and repeat enema if not feeling better go to er in 2 days

## 2019-01-06 ENCOUNTER — Telehealth: Payer: Self-pay

## 2019-01-06 NOTE — Telephone Encounter (Signed)
PT CALLED STATING THAT THE NEW RX FOR HER BISOPROLOL SHE PICKED UP WAS 5MG  PO DAILY INSTEAD OF 10MG  PO DAILY THAT ADAM CHANGED THE RX TO. CHECKED LAST VISIT NOTE AND THE RX WAS CHANGED TO 10 MG. ASKED PT TO READ DIRECTIONS ON THE BOTTLE SHE RECEIVED AND PT SAID THAT IT READ 5MG  PO DAILY.   CALLED PT PHARMACY TO CLARIFY WHAT WAS FILLED FOR PT. THEY CONFIRMED THAT 10 MG PO DAILY OF BISOPROLO WAS FILLED AND THAT THE 5MG  PO DAILY BISOPROLOL WAS LAST FILLED ON 12/02/2018.  CALLED PT BACK AND NOTIFIED HER THAT THE PHARMACY NOTIFIED ME OF THE ABOVE. ADVISED PT TO TAKE THE MEDICATION TO THE PHARMACY SO THEY ARE ABLE TO HELP HER RECEIVE THE RIGHT RX.

## 2019-01-23 ENCOUNTER — Other Ambulatory Visit: Payer: Self-pay | Admitting: Internal Medicine

## 2019-01-25 ENCOUNTER — Other Ambulatory Visit: Payer: Self-pay | Admitting: Internal Medicine

## 2019-01-27 ENCOUNTER — Other Ambulatory Visit: Payer: Self-pay | Admitting: Adult Health

## 2019-02-14 ENCOUNTER — Other Ambulatory Visit: Payer: Self-pay

## 2019-02-14 ENCOUNTER — Telehealth: Payer: Self-pay

## 2019-02-14 DIAGNOSIS — E1165 Type 2 diabetes mellitus with hyperglycemia: Secondary | ICD-10-CM

## 2019-02-14 MED ORDER — BASAGLAR KWIKPEN 100 UNIT/ML ~~LOC~~ SOPN: 10.0000 [IU] | PEN_INJECTOR | Freq: Every day | SUBCUTANEOUS | 1 refills | Status: DC

## 2019-02-14 NOTE — Telephone Encounter (Signed)
Pt called that her sugar is stay in 177 to 200 in morning as per dr advised pt increased to 20 units baslagar and keep sugar log and we will call Friday and also send new pres to phar

## 2019-02-17 ENCOUNTER — Telehealth: Payer: Self-pay

## 2019-02-17 ENCOUNTER — Other Ambulatory Visit: Payer: Self-pay | Admitting: Internal Medicine

## 2019-02-17 NOTE — Telephone Encounter (Signed)
-----   Message from Corlis Hove sent at 02/17/2019 10:21 AM EDT ----- Regarding: RE: call pt and how is doing with sugar Spoke with pt this morning her glucose 187 and last night 177 and she taking baslagar 20 units  ----- Message ----- From: Corlis Hove Sent: 02/14/2019  11:22 AM EDT To: Asencion Partridge, CMA, # Subject: call pt and how is doing with sugar            Call pt and she how she is doing with her glucose we change baslagar to 20 units

## 2019-02-17 NOTE — Telephone Encounter (Signed)
As per dr pt advised take sat and Sunday baslagar 22 units and call us Monday with her no for glucose

## 2019-02-21 ENCOUNTER — Other Ambulatory Visit: Payer: Self-pay | Admitting: Internal Medicine

## 2019-02-22 ENCOUNTER — Encounter: Payer: Self-pay | Admitting: Adult Health

## 2019-03-14 ENCOUNTER — Encounter: Payer: Self-pay | Admitting: Nurse Practitioner

## 2019-03-14 ENCOUNTER — Other Ambulatory Visit: Payer: Self-pay

## 2019-03-14 ENCOUNTER — Ambulatory Visit (INDEPENDENT_AMBULATORY_CARE_PROVIDER_SITE_OTHER): Payer: Medicare HMO | Admitting: Nurse Practitioner

## 2019-03-14 VITALS — BP 142/87 | HR 82 | Resp 16 | Ht 64.0 in | Wt 211.8 lb

## 2019-03-14 DIAGNOSIS — E119 Type 2 diabetes mellitus without complications: Secondary | ICD-10-CM | POA: Insufficient documentation

## 2019-03-14 DIAGNOSIS — L02429 Furuncle of limb, unspecified: Secondary | ICD-10-CM | POA: Diagnosis not present

## 2019-03-14 DIAGNOSIS — K5900 Constipation, unspecified: Secondary | ICD-10-CM | POA: Diagnosis not present

## 2019-03-14 DIAGNOSIS — E1165 Type 2 diabetes mellitus with hyperglycemia: Secondary | ICD-10-CM | POA: Diagnosis not present

## 2019-03-14 LAB — POCT GLYCOSYLATED HEMOGLOBIN (HGB A1C): Hemoglobin A1C: 7 % — AB (ref 4.0–5.6)

## 2019-03-14 MED ORDER — SULFAMETHOXAZOLE-TRIMETHOPRIM 800-160 MG PO TABS: 1.0000 | ORAL_TABLET | Freq: Two times a day (BID) | ORAL | 0 refills | Status: DC

## 2019-03-14 MED ORDER — BASAGLAR KWIKPEN 100 UNIT/ML ~~LOC~~ SOPN: 25.0000 [IU] | PEN_INJECTOR | Freq: Every day | SUBCUTANEOUS | 1 refills | Status: DC

## 2019-03-14 MED ORDER — MUPIROCIN 2 % EX OINT: TOPICAL_OINTMENT | CUTANEOUS | 1 refills | Status: DC

## 2019-03-14 NOTE — Progress Notes (Signed)
[facility] [place], [place] [address]  Internal MEDICINE  Office Visit Note  Patient Name: Rachel Nielsen  [id number]  [id number]  Date of Service: 03/14/2019   Pt is here for a sick visit.  Chief Complaint  Patient presents with  . Mass    Lumps under arm, the have busted and drained still in the healing process  . Diabetes    blood sugar this morning was 200     The patient presents for sick visit today. Today, she is c/o sores/blisters under both arms. States that she noticed the first one last week. Has gradually gotten more of them. They are painful and they drain a yellowish fluid. States that they hurt so bad, she hasa hard time putting her arms down at her sides. She also states that her blood sugars are still elevated. Dr. Humphrey Rolls increased her Basaglar to 22 units when she was last seen. She continues to take Trulicity 1.5mg  weekly. This morning, her blood sugar was 200. She states that she continues to have severe constipation. She is taking Linzess 275mcg every day. She is also taking stool softener and miralax every day. Reviewed her colonoscopy results which was done last year. There were internal hemorrhoids and stool in the colon, but was otherwise without abnormality.        Current Medication:  Outpatient Encounter Medications as of 03/14/2019  Medication Sig  . ACCU-CHEK FASTCLIX LANCETS MISC Use as directed twice daily diag e11.65  . ALPRAZolam (XANAX) 0.5 MG tablet Take 0.5-1 mg by mouth See admin instructions. Take 1 tablet (0.5mg ) by mouth twice daily and 2 tablets (1mg ) by mouth at bedtime  . amLODipine (NORVASC) 5 MG tablet Take 1 tablet (5 mg total) by mouth daily.  . bisoprolol (ZEBETA) 10 MG tablet Take 1 tablet (10 mg total) by mouth daily.  Marland Kitchen buPROPion (WELLBUTRIN XL) 300 MG 24 hr tablet Take 300 mg by mouth daily.   . busPIRone (BUSPAR) 15 MG tablet Take 15 mg by mouth 2 (two) times daily.   . DULoxetine (CYMBALTA) 60 MG  capsule Take 60 mg by mouth daily.   . fluticasone (FLONASE) 50 MCG/ACT nasal spray ONE SPRAY IN EACH NOSTRIL TWICE DAILY.  . fluticasone furoate-vilanterol (BREO ELLIPTA) 200-25 MCG/INH AEPB Inhale 1 puff into the lungs daily.  Marland Kitchen gabapentin (NEURONTIN) 300 MG capsule Take 1 capsule (300 mg total) by mouth 2 (two) times daily.  Marland Kitchen glucose blood (ACCU-CHEK GUIDE) test strip Use twice a daily diag e11.65  . Insulin Glargine (BASAGLAR KWIKPEN) 100 UNIT/ML SOPN Inject 0.25 mLs (25 Units total) into the skin daily.  . Insulin Pen Needle 32G X 4 MM MISC Use as directed with insulin  . LATUDA 20 MG TABS tablet Take 20 mg by mouth daily.   Marland Kitchen linaclotide (LINZESS) 290 MCG CAPS capsule Take 1 capsule (290 mcg total) by mouth daily before breakfast.  . meloxicam (MOBIC) 15 MG tablet Take 1 tablet (15 mg total) by mouth at bedtime.  Marland Kitchen nystatin (MYCOSTATIN) 100000 UNIT/ML suspension 5 ml at night swish and swallow  . Olopatadine HCl 0.2 % SOLN Place 1 drop into both eyes daily.   . pantoprazole (PROTONIX) 40 MG tablet Take 1 tablet (40 mg total) by mouth daily.  Marland Kitchen PROAIR HFA 108 (90 Base) MCG/ACT inhaler TAKE TWO PUFFS THREE TIMES A DAY. (Patient taking differently: Inhale 2 puffs into the lungs 3 (three) times daily. )  . ranitidine (ZANTAC) 150 MG tablet Take  150 mg by mouth daily as needed for heartburn.   . rosuvastatin (CRESTOR) 10 MG tablet Take 1 tablet (10 mg total) by mouth daily.  Marland Kitchen SAPHRIS 10 MG SUBL Take 10 mg by mouth every evening.   . TRAVATAN Z 0.004 % SOLN ophthalmic solution Place 1 drop into both eyes at bedtime.   . TRULICITY 1.5 KX/3.8HW SOPN inject 1.5 mg once weekly for diabetes  . WIXELA INHUB 250-50 MCG/DOSE AEPB INHALE 1 PUFF TWICE DAILY, RINSE MOUTH AFTER EACH USE  . [DISCONTINUED] Insulin Glargine (BASAGLAR KWIKPEN) 100 UNIT/ML SOPN Inject 0.1-0.2 mLs (10-20 Units total) into the skin daily.  . mupirocin ointment (BACTROBAN) 2 % Apply to affected areas bid for 10 days  .  sulfamethoxazole-trimethoprim (BACTRIM DS) 800-160 MG tablet Take 1 tablet by mouth 2 (two) times daily.   No facility-administered encounter medications on file as of 03/14/2019.       Medical History: Past Medical History:  Diagnosis Date  . Anxiety   . Asthma   . Constipation   . COPD (chronic obstructive pulmonary disease) ([place])   . Depression   . Diabetes ([place])   . GERD (gastroesophageal reflux disease)   . Hypertension   . Sleep apnea      Vital Signs: BP (!) 142/87 (BP Location: Right Arm, Patient Position: Sitting, Cuff Size: Normal)   Pulse 82   Resp 16   Ht 5\' 4"  (1.626 m)   Wt 211 lb 12.8 oz (96.1 kg)   SpO2 96%   BMI 36.36 kg/m    Review of Systems  Constitutional: Negative for activity change, chills, fatigue and unexpected weight change.  HENT: Negative for congestion, rhinorrhea, sneezing and sore throat.   Respiratory: Negative for cough, chest tightness, shortness of breath and wheezing.   Cardiovascular: Negative for chest pain and palpitations.  Gastrointestinal: Positive for abdominal distention and constipation. Negative for abdominal pain, diarrhea, nausea and vomiting.  Endocrine:       Blood sugars are elevated despite increase in basaglar to 22uints  Genitourinary: Negative for dysuria and frequency.  Musculoskeletal: Negative for arthralgias, back pain, joint swelling and neck pain.  Skin: Positive for rash.       Rash with redness and sores under both arms. Drain yellow fluid. Very sore and warm to touch.   Allergic/Immunologic: Negative.   Neurological: Negative for tremors and numbness.  Hematological: Negative for adenopathy. Does not bruise/bleed easily.  Psychiatric/Behavioral: Negative for behavioral problems and sleep disturbance. The patient is not nervous/anxious.     Physical Exam Vitals signs and nursing note reviewed.  Constitutional:      General: She is not in acute distress.    Appearance: She is well-developed. She is  obese. She is not diaphoretic.  HENT:     Head: Normocephalic and atraumatic.     Mouth/Throat:     Pharynx: No oropharyngeal exudate.  Eyes:     Pupils: Pupils are equal, round, and reactive to light.  Neck:     Musculoskeletal: Normal range of motion and neck supple.     Thyroid: No thyromegaly.     Vascular: No JVD.     Trachea: No tracheal deviation.  Cardiovascular:     Rate and Rhythm: Normal rate and regular rhythm.     Heart sounds: Normal heart sounds. No murmur. No friction rub. No gallop.   Pulmonary:     Effort: Pulmonary effort is normal. No respiratory distress.     Breath sounds: Normal breath sounds. No  wheezing or rales.  Chest:     Chest wall: No tenderness.  Abdominal:     Palpations: Abdomen is soft.     Tenderness: There is no abdominal tenderness. There is no guarding.  Musculoskeletal: Normal range of motion.  Lymphadenopathy:     Cervical: No cervical adenopathy.  Skin:    General: Skin is warm.     Findings: Lesion present.     Comments: Multiple red, sore, warm lesions under both arms. Erythema surrounding the lesions. Evidence of yellowish fluid draining from the lesions.   Neurological:     Mental Status: She is alert and oriented to person, place, and time. Mental status is at baseline.     Cranial Nerves: No cranial nerve deficit.  Psychiatric:        Behavior: Behavior normal.        Thought Content: Thought content normal.        Judgment: Judgment normal.   Assessment/Plan: 1. Furuncle of axilla, unspecified laterality Start bactrm DS bid for 14 days. Use Bactroban ointment twice daily, applying to all affected areas. Advised her to gently clean under the arms and pat dry. Avoid using deoderant until lesions clear.  - sulfamethoxazole-trimethoprim (BACTRIM DS) 800-160 MG tablet; Take 1 tablet by mouth 2 (two) times daily.  Dispense: 28 tablet; Refill: 0 - mupirocin ointment (BACTROBAN) 2 %; Apply to affected areas bid for 10 days  Dispense:  22 g; Refill: 1  2. Uncontrolled type 2 diabetes mellitus with hyperglycemia (HCC) - POCT HgB A1C 7.0 today. Improved from 7.9 at her last check. Increased Basaglar to 25units. Continue trulicity as prescribed.  - Insulin Glargine (BASAGLAR KWIKPEN) 100 UNIT/ML SOPN; Inject 0.25 mLs (25 Units total) into the skin daily.  Dispense: 5 pen; Refill: 1  3. Constipation, unspecified constipation type Increase water intake. Will look into current medication list and see if current medications could be contributing to constipation.   General Counseling: khilee hendricksen understanding of the findings of todays visit and agrees with plan of treatment. I have discussed any further diagnostic evaluation that may be needed or ordered today. We also reviewed her medications today. she has been encouraged to call the office with any questions or concerns that should arise related to todays visit.    Counseling:  Diabetes Counseling:  1. Addition of ACE inh/ ARB'S for nephroprotection. Microalbumin is updated  2. Diabetic foot care, prevention of complications. Podiatry consult 3. Exercise and lose weight.  4. Diabetic eye examination, Diabetic eye exam is updated  5. Monitor blood sugar closlely. nutrition counseling.  6. Sign and symptoms of hypoglycemia including shaking sweating,confusion and headaches.   This patient was seen by Leretha Pol FNP Collaboration with Dr Lavera Guise as a part of collaborative care agreement  Orders Placed This Encounter  Procedures  . POCT HgB A1C    Meds ordered this encounter  Medications  . Insulin Glargine (BASAGLAR KWIKPEN) 100 UNIT/ML SOPN    Sig: Inject 0.25 mLs (25 Units total) into the skin daily.    Dispense:  5 pen    Refill:  1    Increasing dose. Patient not ready for refills at this time.    Order Specific Question:   Supervising Provider    Answer:   Lavera Guise [[id number]]  . sulfamethoxazole-trimethoprim (BACTRIM DS) 800-160 MG tablet     Sig: Take 1 tablet by mouth 2 (two) times daily.    Dispense:  28 tablet    Refill:  0    Order Specific Question:   Supervising Provider    Answer:   Lavera Guise [[id number]]  . mupirocin ointment (BACTROBAN) 2 %    Sig: Apply to affected areas bid for 10 days    Dispense:  22 g    Refill:  1    Order Specific Question:   Supervising Provider    Answer:   Lavera Guise [[id number]]    Time spent: 25 Minutes

## 2019-03-21 DIAGNOSIS — H2513 Age-related nuclear cataract, bilateral: Secondary | ICD-10-CM | POA: Diagnosis not present

## 2019-03-21 DIAGNOSIS — H4089 Other specified glaucoma: Secondary | ICD-10-CM | POA: Diagnosis not present

## 2019-03-24 ENCOUNTER — Other Ambulatory Visit: Payer: Self-pay | Admitting: Nurse Practitioner

## 2019-03-24 ENCOUNTER — Telehealth: Payer: Self-pay

## 2019-03-24 DIAGNOSIS — B370 Candidal stomatitis: Secondary | ICD-10-CM

## 2019-03-24 MED ORDER — NYSTATIN 100000 UNIT/ML MT SUSP: OROMUCOSAL | 1 refills | Status: DC

## 2019-03-24 NOTE — Telephone Encounter (Signed)
Patient c/o thrush like symptoms. Sent prescription for nystatin suspension. Swish/gargle with this 3 to 4 times daily for next 7 days and as needed. Sent to [facility].

## 2019-03-24 NOTE — Telephone Encounter (Signed)
Pt advised we send nystatin to [facility]

## 2019-03-24 NOTE — Progress Notes (Signed)
Patient c/o thrush like symptoms. Sent prescription for nystatin suspension. Swish/gargle with this 3 to 4 times daily for next 7 days and as needed. Sent to [facility].

## 2019-04-05 ENCOUNTER — Ambulatory Visit: Payer: Self-pay | Admitting: Nurse Practitioner

## 2019-04-24 ENCOUNTER — Ambulatory Visit: Payer: Self-pay | Admitting: Adult Health

## 2019-04-26 ENCOUNTER — Ambulatory Visit: Payer: Self-pay | Admitting: Adult Health

## 2019-04-28 ENCOUNTER — Other Ambulatory Visit: Payer: Self-pay | Admitting: Nurse Practitioner

## 2019-04-28 DIAGNOSIS — B370 Candidal stomatitis: Secondary | ICD-10-CM

## 2019-04-28 MED ORDER — NYSTATIN 100000 UNIT/ML MT SUSP: OROMUCOSAL | 1 refills | Status: DC

## 2019-05-01 ENCOUNTER — Other Ambulatory Visit: Payer: Self-pay | Admitting: Adult Health

## 2019-05-01 DIAGNOSIS — F4310 Post-traumatic stress disorder, unspecified: Secondary | ICD-10-CM | POA: Diagnosis not present

## 2019-05-01 DIAGNOSIS — I10 Essential (primary) hypertension: Secondary | ICD-10-CM

## 2019-05-01 DIAGNOSIS — K5900 Constipation, unspecified: Secondary | ICD-10-CM

## 2019-05-01 DIAGNOSIS — F3189 Other bipolar disorder: Secondary | ICD-10-CM | POA: Diagnosis not present

## 2019-05-03 ENCOUNTER — Other Ambulatory Visit: Payer: Self-pay

## 2019-05-03 ENCOUNTER — Ambulatory Visit: Payer: Self-pay | Admitting: Adult Health

## 2019-05-03 MED ORDER — FLUTICASONE PROPIONATE 50 MCG/ACT NA SUSP: NASAL | 3 refills | Status: DC

## 2019-05-04 ENCOUNTER — Other Ambulatory Visit: Payer: Self-pay | Admitting: Nurse Practitioner

## 2019-05-04 DIAGNOSIS — L02429 Furuncle of limb, unspecified: Secondary | ICD-10-CM

## 2019-05-04 MED ORDER — SULFAMETHOXAZOLE-TRIMETHOPRIM 800-160 MG PO TABS: 1.0000 | ORAL_TABLET | Freq: Two times a day (BID) | ORAL | 0 refills | Status: DC

## 2019-05-04 MED ORDER — MUPIROCIN 2 % EX OINT: TOPICAL_OINTMENT | CUTANEOUS | 2 refills | Status: DC

## 2019-05-04 NOTE — Telephone Encounter (Signed)
Pt asked for a refill for blisters under her arms , ok to fill bactroban and bactrim

## 2019-05-16 ENCOUNTER — Ambulatory Visit: Payer: Self-pay | Admitting: Adult Health

## 2019-05-29 ENCOUNTER — Other Ambulatory Visit: Payer: Self-pay | Admitting: Adult Health

## 2019-05-29 ENCOUNTER — Other Ambulatory Visit: Payer: Self-pay | Admitting: Internal Medicine

## 2019-05-29 DIAGNOSIS — E1165 Type 2 diabetes mellitus with hyperglycemia: Secondary | ICD-10-CM

## 2019-05-31 DIAGNOSIS — F329 Major depressive disorder, single episode, unspecified: Secondary | ICD-10-CM | POA: Diagnosis not present

## 2019-05-31 DIAGNOSIS — K210 Gastro-esophageal reflux disease with esophagitis: Secondary | ICD-10-CM | POA: Diagnosis not present

## 2019-05-31 DIAGNOSIS — E1165 Type 2 diabetes mellitus with hyperglycemia: Secondary | ICD-10-CM | POA: Diagnosis not present

## 2019-05-31 DIAGNOSIS — K5900 Constipation, unspecified: Secondary | ICD-10-CM | POA: Diagnosis not present

## 2019-05-31 DIAGNOSIS — I10 Essential (primary) hypertension: Secondary | ICD-10-CM | POA: Diagnosis not present

## 2019-05-31 DIAGNOSIS — K5909 Other constipation: Secondary | ICD-10-CM | POA: Diagnosis not present

## 2019-05-31 DIAGNOSIS — Z86010 Personal history of colonic polyps: Secondary | ICD-10-CM | POA: Diagnosis not present

## 2019-06-07 ENCOUNTER — Other Ambulatory Visit: Payer: Self-pay

## 2019-06-07 ENCOUNTER — Ambulatory Visit (INDEPENDENT_AMBULATORY_CARE_PROVIDER_SITE_OTHER): Payer: Medicare HMO | Admitting: Adult Health

## 2019-06-07 ENCOUNTER — Encounter: Payer: Self-pay | Admitting: Adult Health

## 2019-06-07 VITALS — BP 132/88 | HR 75 | Resp 16 | Ht 64.0 in | Wt 210.0 lb

## 2019-06-07 DIAGNOSIS — K5901 Slow transit constipation: Secondary | ICD-10-CM

## 2019-06-07 DIAGNOSIS — Z1239 Encounter for other screening for malignant neoplasm of breast: Secondary | ICD-10-CM | POA: Diagnosis not present

## 2019-06-07 DIAGNOSIS — E1165 Type 2 diabetes mellitus with hyperglycemia: Secondary | ICD-10-CM

## 2019-06-07 DIAGNOSIS — B370 Candidal stomatitis: Secondary | ICD-10-CM | POA: Diagnosis not present

## 2019-06-07 LAB — POCT GLYCOSYLATED HEMOGLOBIN (HGB A1C): Hemoglobin A1C: 8.2 % — AB (ref 4.0–5.6)

## 2019-06-07 MED ORDER — FARXIGA 5 MG PO TABS: 5.0000 mg | ORAL_TABLET | Freq: Every day | ORAL | 1 refills | Status: DC

## 2019-06-07 MED ORDER — BASAGLAR KWIKPEN 100 UNIT/ML ~~LOC~~ SOPN: 32.0000 [IU] | PEN_INJECTOR | Freq: Every day | SUBCUTANEOUS | 1 refills | Status: DC

## 2019-06-07 NOTE — Progress Notes (Signed)
[facility] [place], [place] [address]  Internal MEDICINE  Office Visit Note  Patient Name: Rachel Nielsen  [id number]  [id number]  Date of Service: 06/07/2019  Chief Complaint  Patient presents with  . Medical Management of Chronic Issues    3 month follow up , pt is concerned about gi issues.   . Diabetes  . Hypertension  . Depression  . Gastroesophageal Reflux  . Quality Metric Gaps    mammogram     HPI  Pt is here for 3 month follow up on DM, HTN, Depression, and GERD.  She reports having seen GI doctor who is attempting to schedule her for a colonoscopy at this time. She reports her blood pressure and depression have been well controlled. Her DM has been fair, she reports since being taken off of metformin in [place] of this year she has had difficulty with her blood sugars.  She is currently on trulicity and Engineer, agricultural.  Her A1C increased from 7.0 to 8.2 today.      Current Medication: Outpatient Encounter Medications as of 06/07/2019  Medication Sig  . Accu-Chek FastClix Lancets MISC Use as directed twice daily diag e11.65  . ACCU-CHEK GUIDE test strip Use twice a daily diag e11.65  . ALPRAZolam (XANAX) 0.5 MG tablet Take 0.5-1 mg by mouth See admin instructions. Take 1 tablet (0.5mg ) by mouth twice daily and 2 tablets (1mg ) by mouth at bedtime  . amLODipine (NORVASC) 5 MG tablet Take 1 tablet (5 mg total) by mouth daily.  . bisoprolol (ZEBETA) 10 MG tablet Take 1 tablet (10 mg total) by mouth daily.  Marland Kitchen buPROPion (WELLBUTRIN XL) 300 MG 24 hr tablet Take 300 mg by mouth daily.   . busPIRone (BUSPAR) 15 MG tablet Take 15 mg by mouth 2 (two) times daily.   . DULoxetine (CYMBALTA) 60 MG capsule Take 60 mg by mouth daily.   . famotidine (PEPCID) 20 MG tablet Take 20 mg by mouth 2 (two) times daily.  . fluticasone (FLONASE) 50 MCG/ACT nasal spray ONE SPRAY IN EACH NOSTRIL TWICE DAILY.  . fluticasone furoate-vilanterol (BREO ELLIPTA) 200-25 MCG/INH AEPB  Inhale 1 puff into the lungs daily.  Marland Kitchen gabapentin (NEURONTIN) 300 MG capsule Take 1 capsule (300 mg total) by mouth 2 (two) times daily.  . Insulin Glargine (BASAGLAR KWIKPEN) 100 UNIT/ML SOPN Inject 0.25 mLs (25 Units total) into the skin daily.  . Insulin Pen Needle 32G X 4 MM MISC Use as directed with insulin  . lactulose (CHRONULAC) 10 GM/15ML solution Take by mouth 2 (two) times daily.  Marland Kitchen LATUDA 20 MG TABS tablet Take 20 mg by mouth daily.   Marland Kitchen LINZESS 290 MCG CAPS capsule Take 1 capsule (290 mcg total) by mouth daily before breakfast.  . meloxicam (MOBIC) 15 MG tablet Take 1 tablet (15 mg total) by mouth at bedtime.  . mupirocin ointment (BACTROBAN) 2 % Apply to affected areas bid for 10 days  . nystatin (MYCOSTATIN) 100000 UNIT/ML suspension Swish and swallow with 75mls TID to QID for thrush  . Olopatadine HCl 0.2 % SOLN Place 1 drop into both eyes daily.   . pantoprazole (PROTONIX) 40 MG tablet Take 1 tablet (40 mg total) by mouth daily.  Marland Kitchen PROAIR HFA 108 (90 Base) MCG/ACT inhaler TAKE TWO PUFFS THREE TIMES A DAY. (Patient taking differently: Inhale 2 puffs into the lungs 3 (three) times daily. )  . ranitidine (ZANTAC) 150 MG tablet Take 150 mg by mouth daily as needed  for heartburn.   . rosuvastatin (CRESTOR) 10 MG tablet TAKE 1 TABLET DAILY.  Marland Kitchen SAPHRIS 10 MG SUBL Take 10 mg by mouth every evening.   . TRAVATAN Z 0.004 % SOLN ophthalmic solution Place 1 drop into both eyes at bedtime.   . TRULICITY 1.5 GD/9.2EQ SOPN inject 1.5 mg once weekly for diabetes  . WIXELA INHUB 250-50 MCG/DOSE AEPB INHALE 1 PUFF TWICE DAILY, RINSE MOUTH AFTER EACH USE  . [DISCONTINUED] sulfamethoxazole-trimethoprim (BACTRIM DS) 800-160 MG tablet Take 1 tablet by mouth 2 (two) times daily. (Patient not taking: Reported on 06/07/2019)   No facility-administered encounter medications on file as of 06/07/2019.     Surgical History: Past Surgical History:  Procedure Laterality Date  . APPENDECTOMY    .  CHOLECYSTECTOMY    . COLONOSCOPY WITH PROPOFOL N/A 07/20/2018   Procedure: COLONOSCOPY WITH PROPOFOL;  Surgeon: Toledo, Benay Pike, MD;  Location: [facility] ENDOSCOPY;  Service: Gastroenterology;  Laterality: N/A;  . ESOPHAGOGASTRODUODENOSCOPY N/A 07/20/2018   Procedure: ESOPHAGOGASTRODUODENOSCOPY (EGD);  Surgeon: Toledo, Benay Pike, MD;  Location: [facility] ENDOSCOPY;  Service: Gastroenterology;  Laterality: N/A;  . GASTRECTOMY    . TUBAL LIGATION    . VAGINAL HYSTERECTOMY      Medical History: Past Medical History:  Diagnosis Date  . Anxiety   . Asthma   . Constipation   . COPD (chronic obstructive pulmonary disease) ([place])   . Depression   . Diabetes ([place])   . GERD (gastroesophageal reflux disease)   . Hypertension   . Sleep apnea     Family History: Family History  Problem Relation Age of Onset  . Hypertension Mother   . Atrial fibrillation Mother   . Heart attack Mother   . Hypertension Father   . Stomach cancer Father   . Diabetes Father   . Liver cancer Father   . Diabetes Sister   . Hypertension Sister     Social History   Socioeconomic History  . Marital status: Legally Separated    Spouse name: Not on file  . Number of children: Not on file  . Years of education: Not on file  . Highest education level: Not on file  Occupational History  . Not on file  Social Needs  . Financial resource strain: Not on file  . Food insecurity    Worry: Not on file    Inability: Not on file  . Transportation needs    Medical: Not on file    Non-medical: Not on file  Tobacco Use  . Smoking status: Current Some Day Smoker    Types: Cigarettes  . Smokeless tobacco: Current User    Types: Snuff  . Tobacco comment: 4 Cigarattes a day  Substance and Sexual Activity  . Alcohol use: Yes    Comment: monthly or less   . Drug use: No  . Sexual activity: Not on file  Lifestyle  . Physical activity    Days per week: Not on file    Minutes per session: Not on file  . Stress: Not on  file  Relationships  . Social Herbalist on phone: Not on file    Gets together: Not on file    Attends religious service: Not on file    Active member of club or organization: Not on file    Attends meetings of clubs or organizations: Not on file    Relationship status: Not on file  . Intimate partner violence    Fear of current or  ex partner: Not on file    Emotionally abused: Not on file    Physically abused: Not on file    Forced sexual activity: Not on file  Other Topics Concern  . Not on file  Social History Narrative  . Not on file      Review of Systems  Constitutional: Negative for chills, fatigue and unexpected weight change.  HENT: Negative for congestion, rhinorrhea, sneezing and sore throat.   Eyes: Negative for photophobia, pain and redness.  Respiratory: Negative for cough, chest tightness and shortness of breath.   Cardiovascular: Negative for chest pain and palpitations.  Gastrointestinal: Negative for abdominal pain, constipation, diarrhea, nausea and vomiting.  Endocrine: Negative.   Genitourinary: Negative for dysuria and frequency.  Musculoskeletal: Negative for arthralgias, back pain, joint swelling and neck pain.  Skin: Negative for rash.  Allergic/Immunologic: Negative.   Neurological: Negative for tremors and numbness.  Hematological: Negative for adenopathy. Does not bruise/bleed easily.  Psychiatric/Behavioral: Negative for behavioral problems and sleep disturbance. The patient is not nervous/anxious.     Vital Signs: BP 132/88   Pulse 75   Resp 16   Ht 5\' 4"  (1.626 m)   Wt 210 lb (95.3 kg)   SpO2 98%   BMI 36.05 kg/m    Physical Exam Vitals signs and nursing note reviewed.  Constitutional:      General: She is not in acute distress.    Appearance: She is well-developed. She is not diaphoretic.  HENT:     Head: Normocephalic and atraumatic.     Mouth/Throat:     Pharynx: No oropharyngeal exudate.  Eyes:     Pupils:  Pupils are equal, round, and reactive to light.  Neck:     Musculoskeletal: Normal range of motion and neck supple.     Thyroid: No thyromegaly.     Vascular: No JVD.     Trachea: No tracheal deviation.  Cardiovascular:     Rate and Rhythm: Normal rate and regular rhythm.     Heart sounds: Normal heart sounds. No murmur. No friction rub. No gallop.   Pulmonary:     Effort: Pulmonary effort is normal. No respiratory distress.     Breath sounds: Normal breath sounds. No wheezing or rales.  Chest:     Chest wall: No tenderness.  Abdominal:     Palpations: Abdomen is soft.     Tenderness: There is no abdominal tenderness. There is no guarding.  Musculoskeletal: Normal range of motion.  Lymphadenopathy:     Cervical: No cervical adenopathy.  Skin:    General: Skin is warm and dry.  Neurological:     Mental Status: She is alert and oriented to person, place, and time.     Cranial Nerves: No cranial nerve deficit.  Psychiatric:        Behavior: Behavior normal.        Thought Content: Thought content normal.        Judgment: Judgment normal.     Assessment/Plan: 1. Uncontrolled type 2 diabetes mellitus with hyperglycemia (HCC) Increase basaglar to 30 units daily for one week.  If sugars are greater than 180, may increase to 32 units/day.  Start Farxiga 5mg  daily, and continue trulicity.  - POCT HgB A1C - Insulin Glargine (BASAGLAR KWIKPEN) 100 UNIT/ML SOPN; Inject 0.32 mLs (32 Units total) into the skin daily.  Dispense: 5 pen; Refill: 1  2. Slow transit constipation Working with GI doctor at this time, colonoscopy is scheduled.  Pt is currently  on lactulose and one other medication to help with this currently from GI doctor.  3. Screening for breast cancer - MM DIGITAL SCREENING BILATERAL; Future  4. Oral candidiasis Pt is seeing dentist for treatment of oral ulcers.   General Counseling: ernest orr understanding of the findings of todays visit and agrees with plan of  treatment. I have discussed any further diagnostic evaluation that may be needed or ordered today. We also reviewed her medications today. she has been encouraged to call the office with any questions or concerns that should arise related to todays visit.    Orders Placed This Encounter  Procedures  . POCT HgB A1C    No orders of the defined types were placed in this encounter.   Time spent: 25 Minutes   This patient was seen by Orson Gear AGNP-C in Collaboration with Dr Lavera Guise as a part of collaborative care agreement     Kendell Bane AGNP-C Internal medicine

## 2019-06-08 ENCOUNTER — Ambulatory Visit: Payer: Self-pay | Admitting: Adult Health

## 2019-06-27 ENCOUNTER — Other Ambulatory Visit: Payer: Self-pay | Admitting: Adult Health

## 2019-06-27 ENCOUNTER — Other Ambulatory Visit: Payer: Self-pay | Admitting: Internal Medicine

## 2019-06-27 DIAGNOSIS — I10 Essential (primary) hypertension: Secondary | ICD-10-CM

## 2019-06-27 DIAGNOSIS — K5900 Constipation, unspecified: Secondary | ICD-10-CM

## 2019-07-07 ENCOUNTER — Other Ambulatory Visit: Admission: RE | Admit: 2019-07-07 | Payer: Medicare HMO | Source: Ambulatory Visit

## 2019-08-10 ENCOUNTER — Telehealth: Payer: Self-pay

## 2019-08-10 NOTE — Telephone Encounter (Signed)
[facility] called that pt is on pantoprazole and famotidine as per adam pt supposed to stopped famotidine and keep Protonix and also she had GI appt coming up

## 2019-08-23 ENCOUNTER — Other Ambulatory Visit: Payer: Self-pay

## 2019-08-23 MED ORDER — TRULICITY 1.5 MG/0.5ML ~~LOC~~ SOAJ: SUBCUTANEOUS | 3 refills | Status: DC

## 2019-08-24 ENCOUNTER — Other Ambulatory Visit: Payer: Self-pay | Admitting: Nurse Practitioner

## 2019-08-24 DIAGNOSIS — E1165 Type 2 diabetes mellitus with hyperglycemia: Secondary | ICD-10-CM

## 2019-08-24 MED ORDER — ROSUVASTATIN CALCIUM 10 MG PO TABS: 10.0000 mg | ORAL_TABLET | Freq: Every day | ORAL | 1 refills | Status: DC

## 2019-08-24 MED ORDER — FLUTICASONE PROPIONATE 50 MCG/ACT NA SUSP: NASAL | 5 refills | Status: DC

## 2019-08-25 ENCOUNTER — Other Ambulatory Visit: Payer: Self-pay | Admitting: Internal Medicine

## 2019-08-30 ENCOUNTER — Other Ambulatory Visit: Payer: Self-pay | Admitting: Internal Medicine

## 2019-09-08 ENCOUNTER — Other Ambulatory Visit: Payer: Self-pay | Admitting: Internal Medicine

## 2019-09-20 ENCOUNTER — Other Ambulatory Visit: Payer: Self-pay

## 2019-09-20 MED ORDER — GLOBAL EASE INJECT PEN NEEDLES 32G X 4 MM MISC: 0 refills | Status: DC

## 2019-09-21 ENCOUNTER — Telehealth: Payer: Self-pay

## 2019-09-21 NOTE — Telephone Encounter (Signed)
Rescheduled appointment on 09/26/2019 to 11/07/2019,  patient having transportation issues. klh

## 2019-09-25 ENCOUNTER — Ambulatory Visit: Payer: Self-pay | Admitting: Adult Health

## 2019-09-26 ENCOUNTER — Ambulatory Visit: Payer: Self-pay | Admitting: Adult Health

## 2019-09-26 ENCOUNTER — Other Ambulatory Visit: Payer: Self-pay | Admitting: Adult Health

## 2019-10-12 ENCOUNTER — Other Ambulatory Visit: Payer: Self-pay | Admitting: Adult Health

## 2019-10-12 DIAGNOSIS — E1165 Type 2 diabetes mellitus with hyperglycemia: Secondary | ICD-10-CM

## 2019-10-17 ENCOUNTER — Other Ambulatory Visit: Payer: Self-pay | Admitting: Adult Health

## 2019-10-18 ENCOUNTER — Other Ambulatory Visit: Payer: Self-pay

## 2019-10-18 ENCOUNTER — Ambulatory Visit (INDEPENDENT_AMBULATORY_CARE_PROVIDER_SITE_OTHER): Payer: Medicare HMO | Admitting: Adult Health

## 2019-10-18 ENCOUNTER — Encounter: Payer: Self-pay | Admitting: Adult Health

## 2019-10-18 DIAGNOSIS — E1140 Type 2 diabetes mellitus with diabetic neuropathy, unspecified: Secondary | ICD-10-CM

## 2019-10-18 DIAGNOSIS — Z794 Long term (current) use of insulin: Secondary | ICD-10-CM

## 2019-10-18 DIAGNOSIS — R252 Cramp and spasm: Secondary | ICD-10-CM

## 2019-10-18 DIAGNOSIS — I10 Essential (primary) hypertension: Secondary | ICD-10-CM | POA: Diagnosis not present

## 2019-10-18 NOTE — Progress Notes (Signed)
Error

## 2019-10-18 NOTE — Progress Notes (Signed)
[facility] [place], [place] [address]  Internal MEDICINE  Telephone Visit  Patient Name: Rachel Nielsen  [id number]  [id number]  Date of Service: 10/18/2019  I connected with the patient at 846 by telephone and verified the patients identity using two identifiers.   I discussed the limitations, risks, security and privacy concerns of performing an evaluation and management service by telephone and the availability of in person appointments. I also discussed with the patient that there may be a patient responsible charge related to the service.  The patient expressed understanding and agrees to proceed.    Chief Complaint  Patient presents with  . Telephone Screen  . Dysmenorrhea    Hands , feet , left side of face , and stomach, think neuropathy in feet are getting worse .  Marland Kitchen Telephone Assessment    HPI  Pt seen via video.  She is complaining of cramps in feet and hand, as well as calves for 2-3 months. She reports that are intermittent.  They happen at rest and during actitivy, and they happen during the day or night. For the last 2-3 days she has been having worse cramping, that is now in her back, and abdomen as well.  She reports a feeling like someone is sticking pins in her toes.  She reports her blood sugar has remained high, and it averages 220-230 mg/dl in the morning.    Current Medication: Outpatient Encounter Medications as of 10/18/2019  Medication Sig  . Accu-Chek FastClix Lancets MISC Use as directed twice daily diag e11.65  . ACCU-CHEK GUIDE test strip Use twice a daily diag e11.65  . ALPRAZolam (XANAX) 0.5 MG tablet Take 0.5-1 mg by mouth See admin instructions. Take 1 tablet (0.5mg ) by mouth twice daily and 2 tablets (1mg ) by mouth at bedtime  . amLODipine (NORVASC) 5 MG tablet Take 1 tablet (5 mg total) by mouth daily.  . bisoprolol (ZEBETA) 10 MG tablet Take 1 tablet (10 mg total) by mouth daily.  Marland Kitchen buPROPion (WELLBUTRIN XL) 300 MG 24 hr  tablet Take 300 mg by mouth daily.   . busPIRone (BUSPAR) 15 MG tablet Take 15 mg by mouth 2 (two) times daily.   . Dulaglutide (TRULICITY) 1.5 0000000 SOPN inject 1.5 mg once weekly for diabetes  . DULoxetine (CYMBALTA) 60 MG capsule Take 60 mg by mouth daily.   . famotidine (PEPCID) 20 MG tablet Take 20 mg by mouth 2 (two) times daily.  Marland Kitchen FARXIGA 5 MG TABS tablet Take 5 mg by mouth daily.  . fluticasone (FLONASE) 50 MCG/ACT nasal spray ONE SPRAY IN EACH NOSTRIL TWICE DAILY.  . fluticasone furoate-vilanterol (BREO ELLIPTA) 200-25 MCG/INH AEPB Inhale 1 puff into the lungs daily.  Marland Kitchen gabapentin (NEURONTIN) 300 MG capsule Take 1 capsule (300 mg total) by mouth 2 (two) times daily.  Marland Kitchen GLOBAL EASE INJECT PEN NEEDLES 32G X 4 MM MISC Use as directed with insulin  . Insulin Glargine (BASAGLAR KWIKPEN) 100 UNIT/ML SOPN Inject 0.32 mLs (32 Units total) into the skin daily.  . Insulin Pen Needle (BD PEN NEEDLE NANO U/F) 32G X 4 MM MISC by Does not apply route.  . lactulose (CHRONULAC) 10 GM/15ML solution Take by mouth 2 (two) times daily.  Marland Kitchen LATUDA 20 MG TABS tablet Take 20 mg by mouth daily.   Marland Kitchen LINZESS 290 MCG CAPS capsule Take 1 capsule (290 mcg total) by mouth daily before breakfast.  . meloxicam (MOBIC) 15 MG tablet Take 1 tablet (15  mg total) by mouth at bedtime.  . mupirocin ointment (BACTROBAN) 2 % Apply to affected areas bid for 10 days  . nystatin (MYCOSTATIN) 100000 UNIT/ML suspension Swish and swallow with 12mls TID to QID for thrush  . Olopatadine HCl 0.2 % SOLN Place 1 drop into both eyes daily.   . pantoprazole (PROTONIX) 40 MG tablet Take 1 tablet (40 mg total) by mouth daily.  . ranitidine (ZANTAC) 150 MG tablet Take 150 mg by mouth daily as needed for heartburn.   . rosuvastatin (CRESTOR) 10 MG tablet Take 1 tablet (10 mg total) by mouth daily.  Marland Kitchen SAPHRIS 10 MG SUBL Take 10 mg by mouth every evening.   . TRAVATAN Z 0.004 % SOLN ophthalmic solution Place 1 drop into both eyes at bedtime.    . VENTOLIN HFA 108 (90 Base) MCG/ACT inhaler TAKE TWO PUFFS THREE TIMES A DAY.  Marland Kitchen WIXELA INHUB 250-50 MCG/DOSE AEPB INHALE 1 PUFF TWICE DAILY. RINSE MOUTH AFTER EACH USE   No facility-administered encounter medications on file as of 10/18/2019.    Surgical History: Past Surgical History:  Procedure Laterality Date  . APPENDECTOMY    . CHOLECYSTECTOMY    . COLONOSCOPY WITH PROPOFOL N/A 07/20/2018   Procedure: COLONOSCOPY WITH PROPOFOL;  Surgeon: Toledo, Benay Pike, MD;  Location: [facility] ENDOSCOPY;  Service: Gastroenterology;  Laterality: N/A;  . ESOPHAGOGASTRODUODENOSCOPY N/A 07/20/2018   Procedure: ESOPHAGOGASTRODUODENOSCOPY (EGD);  Surgeon: Toledo, Benay Pike, MD;  Location: [facility] ENDOSCOPY;  Service: Gastroenterology;  Laterality: N/A;  . GASTRECTOMY    . TUBAL LIGATION    . VAGINAL HYSTERECTOMY      Medical History: Past Medical History:  Diagnosis Date  . Anxiety   . Asthma   . Constipation   . COPD (chronic obstructive pulmonary disease) ([place])   . Depression   . Diabetes ([place])   . GERD (gastroesophageal reflux disease)   . Hypertension   . Sleep apnea     Family History: Family History  Problem Relation Age of Onset  . Hypertension Mother   . Atrial fibrillation Mother   . Heart attack Mother   . Hypertension Father   . Stomach cancer Father   . Diabetes Father   . Liver cancer Father   . Diabetes Sister   . Hypertension Sister     Social History   Socioeconomic History  . Marital status: Legally Separated    Spouse name: Not on file  . Number of children: Not on file  . Years of education: Not on file  . Highest education level: Not on file  Occupational History  . Not on file  Tobacco Use  . Smoking status: Current Some Day Smoker    Types: Cigarettes  . Smokeless tobacco: Current User    Types: Snuff  . Tobacco comment: 4 Cigarattes a day  Substance and Sexual Activity  . Alcohol use: Yes    Comment: monthly or less   . Drug use: No  . Sexual  activity: Not on file  Other Topics Concern  . Not on file  Social History Narrative  . Not on file   Social Determinants of Health   Financial Resource Strain:   . Difficulty of Paying Living Expenses: Not on file  Food Insecurity:   . Worried About Charity fundraiser in the Last Year: Not on file  . Ran Out of Food in the Last Year: Not on file  Transportation Needs:   . Lack of Transportation (Medical): Not on file  .  Lack of Transportation (Non-Medical): Not on file  Physical Activity:   . Days of Exercise per Week: Not on file  . Minutes of Exercise per Session: Not on file  Stress:   . Feeling of Stress : Not on file  Social Connections:   . Frequency of Communication with Friends and Family: Not on file  . Frequency of Social Gatherings with Friends and Family: Not on file  . Attends Religious Services: Not on file  . Active Member of Clubs or Organizations: Not on file  . Attends Archivist Meetings: Not on file  . Marital Status: Not on file  Intimate Partner Violence:   . Fear of Current or Ex-Partner: Not on file  . Emotionally Abused: Not on file  . Physically Abused: Not on file  . Sexually Abused: Not on file      Review of Systems  Constitutional: Negative for chills, fatigue and unexpected weight change.  HENT: Negative for congestion, rhinorrhea, sneezing and sore throat.   Eyes: Negative for photophobia, pain and redness.  Respiratory: Negative for cough, chest tightness and shortness of breath.   Cardiovascular: Negative for chest pain and palpitations.  Gastrointestinal: Negative for abdominal pain, constipation, diarrhea, nausea and vomiting.  Endocrine: Negative.   Genitourinary: Negative for dysuria and frequency.  Musculoskeletal: Negative for arthralgias, back pain, joint swelling and neck pain.  Skin: Negative for rash.  Allergic/Immunologic: Negative.   Neurological: Negative for tremors and numbness.  Hematological: Negative for  adenopathy. Does not bruise/bleed easily.  Psychiatric/Behavioral: Negative for behavioral problems and sleep disturbance. The patient is not nervous/anxious.     Vital Signs: There were no vitals taken for this visit.   Observation/Objective: Well sounding NAD noted   Assessment/Plan: 1. Cramps, muscle, general Drink Gatorade, continue home remedys for cramps,  and have labs drawn ASAP.  - Comprehensive Metabolic Panel (CMET) - Magnesium  2. Type 2 diabetes mellitus with diabetic neuropathy, with long-term current use of insulin ([place]) Pt currently takes Engineer, agricultural, [place], and trulicity.  We discussed that she will need to go on sliding scale insulin as her blood sugars remain elevated.  She is agreeable to this, and she will come in for an appt ADAP to get teaching on SSI.     3. Hypertension, unspecified type Controlled, per patient.  Continue current therapy.   General Counseling: Rachel Nielsen understanding of the findings of today's phone visit and agrees with plan of treatment. I have discussed any further diagnostic evaluation that may be needed or ordered today. We also reviewed her medications today. she has been encouraged to call the office with any questions or concerns that should arise related to todays visit.    Orders Placed This Encounter  Procedures  . Comprehensive Metabolic Panel (CMET)  . Magnesium    No orders of the defined types were placed in this encounter.   Time spent: [place] AGNP-C Internal medicine

## 2019-10-19 ENCOUNTER — Telehealth: Payer: Self-pay

## 2019-10-19 DIAGNOSIS — R252 Cramp and spasm: Secondary | ICD-10-CM | POA: Diagnosis not present

## 2019-10-19 NOTE — Telephone Encounter (Signed)
Confirmed appointment with patient. klh °

## 2019-10-20 ENCOUNTER — Ambulatory Visit: Payer: Medicare HMO | Admitting: Adult Health

## 2019-10-20 LAB — COMPREHENSIVE METABOLIC PANEL
ALT: 11 IU/L (ref 0–32)
AST: 14 IU/L (ref 0–40)
Albumin/Globulin Ratio: 1.4 (ref 1.2–2.2)
Albumin: 4.1 g/dL (ref 3.8–4.8)
Alkaline Phosphatase: 111 IU/L (ref 39–117)
BUN/Creatinine Ratio: 17 (ref 12–28)
BUN: 19 mg/dL (ref 8–27)
Bilirubin Total: 0.4 mg/dL (ref 0.0–1.2)
CO2: 23 mmol/L (ref 20–29)
Calcium: 9.4 mg/dL (ref 8.7–10.3)
Chloride: 102 mmol/L (ref 96–106)
Creatinine, Ser: 1.12 mg/dL — ABNORMAL HIGH (ref 0.57–1.00)
GFR calc Af Amer: 59 mL/min/{1.73_m2} — ABNORMAL LOW (ref >59)
GFR calc non Af Amer: 51 mL/min/{1.73_m2} — ABNORMAL LOW (ref >59)
Globulin, Total: 2.9 g/dL (ref 1.5–4.5)
Glucose: 88 mg/dL (ref 65–99)
Potassium: 4.6 mmol/L (ref 3.5–5.2)
Sodium: 141 mmol/L (ref 134–144)
Total Protein: 7 g/dL (ref 6.0–8.5)

## 2019-10-20 LAB — MAGNESIUM: Magnesium: 2.2 mg/dL (ref 1.6–2.3)

## 2019-10-23 ENCOUNTER — Encounter: Payer: Self-pay | Admitting: Adult Health

## 2019-10-23 ENCOUNTER — Other Ambulatory Visit: Payer: Self-pay

## 2019-10-23 ENCOUNTER — Ambulatory Visit (INDEPENDENT_AMBULATORY_CARE_PROVIDER_SITE_OTHER): Payer: Medicare HMO | Admitting: Adult Health

## 2019-10-23 VITALS — BP 130/86 | HR 82 | Resp 16 | Ht 64.0 in | Wt 209.0 lb

## 2019-10-23 DIAGNOSIS — E1165 Type 2 diabetes mellitus with hyperglycemia: Secondary | ICD-10-CM | POA: Diagnosis not present

## 2019-10-23 DIAGNOSIS — I10 Essential (primary) hypertension: Secondary | ICD-10-CM | POA: Diagnosis not present

## 2019-10-23 DIAGNOSIS — R7989 Other specified abnormal findings of blood chemistry: Secondary | ICD-10-CM

## 2019-10-23 DIAGNOSIS — R252 Cramp and spasm: Secondary | ICD-10-CM

## 2019-10-23 LAB — POCT GLYCOSYLATED HEMOGLOBIN (HGB A1C): Hemoglobin A1C: 8.4 % — AB (ref 4.0–5.6)

## 2019-10-23 NOTE — Progress Notes (Signed)
[facility] [place], [place] [address]  Internal MEDICINE  Office Visit Note  Patient Name: Rachel Nielsen  [id number]  [id number]  Date of Service: 10/23/2019  Chief Complaint  Patient presents with  . Medical Management of Chronic Issues    review labs     HPI  Pt is here for follow up on DM, and lab results.  Her A1C today is 8.4, which is slightly elevated from 8.2 at last check.  We discussed in our last virtual visit that she would likely need to start an insulin regimen at this visit.  She is currently taking basaglar, farxiga, and trulicity.      Current Medication: Outpatient Encounter Medications as of 10/23/2019  Medication Sig  . Accu-Chek FastClix Lancets MISC Use as directed twice daily diag e11.65  . ACCU-CHEK GUIDE test strip Use twice a daily diag e11.65  . ALPRAZolam (XANAX) 0.5 MG tablet Take 0.5-1 mg by mouth See admin instructions. Take 1 tablet (0.5mg ) by mouth twice daily and 2 tablets (1mg ) by mouth at bedtime  . amLODipine (NORVASC) 5 MG tablet Take 1 tablet (5 mg total) by mouth daily.  . bisoprolol (ZEBETA) 10 MG tablet Take 1 tablet (10 mg total) by mouth daily.  Marland Kitchen buPROPion (WELLBUTRIN XL) 300 MG 24 hr tablet Take 300 mg by mouth daily.   . busPIRone (BUSPAR) 15 MG tablet Take 15 mg by mouth 2 (two) times daily.   . Dulaglutide (TRULICITY) 1.5 0000000 SOPN inject 1.5 mg once weekly for diabetes  . DULoxetine (CYMBALTA) 60 MG capsule Take 60 mg by mouth daily.   . famotidine (PEPCID) 20 MG tablet Take 20 mg by mouth 2 (two) times daily.  Marland Kitchen FARXIGA 5 MG TABS tablet Take 5 mg by mouth daily.  . fluticasone (FLONASE) 50 MCG/ACT nasal spray ONE SPRAY IN EACH NOSTRIL TWICE DAILY.  . fluticasone furoate-vilanterol (BREO ELLIPTA) 200-25 MCG/INH AEPB Inhale 1 puff into the lungs daily.  Marland Kitchen gabapentin (NEURONTIN) 300 MG capsule Take 1 capsule (300 mg total) by mouth 2 (two) times daily.  Marland Kitchen GLOBAL EASE INJECT PEN NEEDLES 32G X 4 MM MISC  Use as directed with insulin  . Insulin Glargine (BASAGLAR KWIKPEN) 100 UNIT/ML SOPN Inject 0.32 mLs (32 Units total) into the skin daily.  . Insulin Pen Needle (BD PEN NEEDLE NANO U/F) 32G X 4 MM MISC by Does not apply route.  . lactulose (CHRONULAC) 10 GM/15ML solution Take by mouth 2 (two) times daily.  Marland Kitchen LATUDA 20 MG TABS tablet Take 20 mg by mouth daily.   Marland Kitchen LINZESS 290 MCG CAPS capsule Take 1 capsule (290 mcg total) by mouth daily before breakfast.  . meloxicam (MOBIC) 15 MG tablet Take 1 tablet (15 mg total) by mouth at bedtime.  . mupirocin ointment (BACTROBAN) 2 % Apply to affected areas bid for 10 days  . nystatin (MYCOSTATIN) 100000 UNIT/ML suspension Swish and swallow with 1mls TID to QID for thrush  . Olopatadine HCl 0.2 % SOLN Place 1 drop into both eyes daily.   . pantoprazole (PROTONIX) 40 MG tablet Take 1 tablet (40 mg total) by mouth daily.  . ranitidine (ZANTAC) 150 MG tablet Take 150 mg by mouth daily as needed for heartburn.   . rosuvastatin (CRESTOR) 10 MG tablet Take 1 tablet (10 mg total) by mouth daily.  Marland Kitchen SAPHRIS 10 MG SUBL Take 10 mg by mouth every evening.   . TRAVATAN Z 0.004 % SOLN ophthalmic solution Place 1  drop into both eyes at bedtime.   . VENTOLIN HFA 108 (90 Base) MCG/ACT inhaler TAKE TWO PUFFS THREE TIMES A DAY.  Marland Kitchen WIXELA INHUB 250-50 MCG/DOSE AEPB INHALE 1 PUFF TWICE DAILY. RINSE MOUTH AFTER EACH USE   No facility-administered encounter medications on file as of 10/23/2019.    Surgical History: Past Surgical History:  Procedure Laterality Date  . APPENDECTOMY    . CHOLECYSTECTOMY    . COLONOSCOPY WITH PROPOFOL N/A 07/20/2018   Procedure: COLONOSCOPY WITH PROPOFOL;  Surgeon: Toledo, Benay Pike, MD;  Location: [facility] ENDOSCOPY;  Service: Gastroenterology;  Laterality: N/A;  . ESOPHAGOGASTRODUODENOSCOPY N/A 07/20/2018   Procedure: ESOPHAGOGASTRODUODENOSCOPY (EGD);  Surgeon: Toledo, Benay Pike, MD;  Location: [facility] ENDOSCOPY;  Service: Gastroenterology;   Laterality: N/A;  . GASTRECTOMY    . TUBAL LIGATION    . VAGINAL HYSTERECTOMY      Medical History: Past Medical History:  Diagnosis Date  . Anxiety   . Asthma   . Constipation   . COPD (chronic obstructive pulmonary disease) ([place])   . Depression   . Diabetes ([place])   . GERD (gastroesophageal reflux disease)   . Hypertension   . Sleep apnea     Family History: Family History  Problem Relation Age of Onset  . Hypertension Mother   . Atrial fibrillation Mother   . Heart attack Mother   . Hypertension Father   . Stomach cancer Father   . Diabetes Father   . Liver cancer Father   . Diabetes Sister   . Hypertension Sister     Social History   Socioeconomic History  . Marital status: Legally Separated    Spouse name: Not on file  . Number of children: Not on file  . Years of education: Not on file  . Highest education level: Not on file  Occupational History  . Not on file  Tobacco Use  . Smoking status: Current Some Day Smoker    Types: Cigarettes  . Smokeless tobacco: Current User    Types: Snuff  . Tobacco comment: 4 Cigarattes a day  Substance and Sexual Activity  . Alcohol use: Yes    Comment: monthly or less   . Drug use: No  . Sexual activity: Not on file  Other Topics Concern  . Not on file  Social History Narrative  . Not on file   Social Determinants of Health   Financial Resource Strain:   . Difficulty of Paying Living Expenses: Not on file  Food Insecurity:   . Worried About Charity fundraiser in the Last Year: Not on file  . Ran Out of Food in the Last Year: Not on file  Transportation Needs:   . Lack of Transportation (Medical): Not on file  . Lack of Transportation (Non-Medical): Not on file  Physical Activity:   . Days of Exercise per Week: Not on file  . Minutes of Exercise per Session: Not on file  Stress:   . Feeling of Stress : Not on file  Social Connections:   . Frequency of Communication with Friends and Family: Not on file   . Frequency of Social Gatherings with Friends and Family: Not on file  . Attends Religious Services: Not on file  . Active Member of Clubs or Organizations: Not on file  . Attends Archivist Meetings: Not on file  . Marital Status: Not on file  Intimate Partner Violence:   . Fear of Current or Ex-Partner: Not on file  . Emotionally Abused:  Not on file  . Physically Abused: Not on file  . Sexually Abused: Not on file      Review of Systems  Constitutional: Negative for chills, fatigue and unexpected weight change.  HENT: Negative for congestion, rhinorrhea, sneezing and sore throat.   Eyes: Negative for photophobia, pain and redness.  Respiratory: Negative for cough, chest tightness and shortness of breath.   Cardiovascular: Negative for chest pain and palpitations.  Gastrointestinal: Negative for abdominal pain, constipation, diarrhea, nausea and vomiting.  Endocrine: Negative.   Genitourinary: Negative for dysuria and frequency.  Musculoskeletal: Negative for arthralgias, back pain, joint swelling and neck pain.  Skin: Negative for rash.  Allergic/Immunologic: Negative.   Neurological: Negative for tremors and numbness.  Hematological: Negative for adenopathy. Does not bruise/bleed easily.  Psychiatric/Behavioral: Negative for behavioral problems and sleep disturbance. The patient is not nervous/anxious.     Vital Signs: BP (!) 147/83   Pulse 82   Resp 16   Ht 5\' 4"  (1.626 m)   Wt 209 lb (94.8 kg)   SpO2 98%   BMI 35.87 kg/m    Physical Exam Vitals and nursing note reviewed.  Constitutional:      General: She is not in acute distress.    Appearance: She is well-developed. She is not diaphoretic.  HENT:     Head: Normocephalic and atraumatic.     Mouth/Throat:     Pharynx: No oropharyngeal exudate.  Eyes:     Pupils: Pupils are equal, round, and reactive to light.  Neck:     Thyroid: No thyromegaly.     Vascular: No JVD.     Trachea: No tracheal  deviation.  Cardiovascular:     Rate and Rhythm: Normal rate and regular rhythm.     Heart sounds: Normal heart sounds. No murmur. No friction rub. No gallop.   Pulmonary:     Effort: Pulmonary effort is normal. No respiratory distress.     Breath sounds: Normal breath sounds. No wheezing or rales.  Chest:     Chest wall: No tenderness.  Abdominal:     Palpations: Abdomen is soft.     Tenderness: There is no abdominal tenderness. There is no guarding.  Musculoskeletal:        General: Normal range of motion.     Cervical back: Normal range of motion and neck supple.  Lymphadenopathy:     Cervical: No cervical adenopathy.  Skin:    General: Skin is warm and dry.  Neurological:     Mental Status: She is alert and oriented to person, place, and time.     Cranial Nerves: No cranial nerve deficit.  Psychiatric:        Behavior: Behavior normal.        Thought Content: Thought content normal.        Judgment: Judgment normal.     Assessment/Plan: 1. Uncontrolled type 2 diabetes mellitus with hyperglycemia ([place]) Start 5 unitis of Novolog, sample given, at dinner time.  Pt instructed on use of Novolog Pen.  Return demonstration.  Will call in 3 days with blood sugars - POCT HgB A1C  2. Hypertension, unspecified type Initially elevated, WNL at recheck 130/86.  Continue present management.   3. Elevated serum creatinine Will get renal US.  - US Renal; Future  4. Cramps, muscle, general Continues to have intermittent symptoms, potassium and magnesium were normal.    General Counseling: Noreene Filbert understanding of the findings of todays visit and agrees with plan of treatment.  I have discussed any further diagnostic evaluation that may be needed or ordered today. We also reviewed her medications today. she has been encouraged to call the office with any questions or concerns that should arise related to todays visit.  Diabetes Counseling:  1. Addition of ACE inh/ ARB'S for  nephroprotection. Microalbumin is updated  2. Diabetic foot care, prevention of complications. Podiatry consult 3. Exercise and lose weight.  4. Diabetic eye examination, Diabetic eye exam is updated  5. Monitor blood sugar closlely. nutrition counseling.  6. Sign and symptoms of hypoglycemia including shaking sweating,confusion and headaches.   Orders Placed This Encounter  Procedures  . POCT HgB A1C    No orders of the defined types were placed in this encounter.   Time spent: 25 Minutes   This patient was seen by Orson Gear AGNP-C in Collaboration with Dr Lavera Guise as a part of collaborative care agreement     Kendell Bane AGNP-C Internal medicine

## 2019-10-30 ENCOUNTER — Other Ambulatory Visit: Payer: Self-pay | Admitting: Adult Health

## 2019-10-30 ENCOUNTER — Telehealth: Payer: Self-pay

## 2019-10-30 DIAGNOSIS — I10 Essential (primary) hypertension: Secondary | ICD-10-CM

## 2019-10-30 NOTE — Telephone Encounter (Signed)
Send message glucose log

## 2019-10-31 NOTE — Telephone Encounter (Signed)
Please tell her to continue her current insulin dose and we will see her at her appt on 1/5

## 2019-10-31 NOTE — Telephone Encounter (Signed)
Pt advised continue same insulin and keep follow up we can discuss in detail on visit

## 2019-11-02 ENCOUNTER — Telehealth: Payer: Self-pay

## 2019-11-02 NOTE — Telephone Encounter (Signed)
Called lmom informing patient of appointment. klh 

## 2019-11-02 NOTE — Telephone Encounter (Signed)
Called lmom informing patient

## 2019-11-07 ENCOUNTER — Other Ambulatory Visit: Payer: Self-pay

## 2019-11-07 ENCOUNTER — Ambulatory Visit (INDEPENDENT_AMBULATORY_CARE_PROVIDER_SITE_OTHER): Payer: Medicare HMO | Admitting: Adult Health

## 2019-11-07 ENCOUNTER — Encounter: Payer: Self-pay | Admitting: Adult Health

## 2019-11-07 VITALS — BP 138/88 | HR 91 | Temp 95.9°F | Resp 16 | Ht 64.0 in | Wt 208.8 lb

## 2019-11-07 DIAGNOSIS — Z0001 Encounter for general adult medical examination with abnormal findings: Secondary | ICD-10-CM

## 2019-11-07 DIAGNOSIS — Z23 Encounter for immunization: Secondary | ICD-10-CM

## 2019-11-07 DIAGNOSIS — F32A Depression, unspecified: Secondary | ICD-10-CM

## 2019-11-07 DIAGNOSIS — R300 Dysuria: Secondary | ICD-10-CM

## 2019-11-07 DIAGNOSIS — J449 Chronic obstructive pulmonary disease, unspecified: Secondary | ICD-10-CM

## 2019-11-07 DIAGNOSIS — K219 Gastro-esophageal reflux disease without esophagitis: Secondary | ICD-10-CM

## 2019-11-07 DIAGNOSIS — F329 Major depressive disorder, single episode, unspecified: Secondary | ICD-10-CM

## 2019-11-07 DIAGNOSIS — I10 Essential (primary) hypertension: Secondary | ICD-10-CM | POA: Diagnosis not present

## 2019-11-07 DIAGNOSIS — E1165 Type 2 diabetes mellitus with hyperglycemia: Secondary | ICD-10-CM | POA: Diagnosis not present

## 2019-11-07 DIAGNOSIS — Z6841 Body Mass Index (BMI) 40.0 and over, adult: Secondary | ICD-10-CM

## 2019-11-07 MED ORDER — INSULIN LISPRO 100 UNIT/ML ~~LOC~~ SOLN: 5.0000 [IU] | Freq: Three times a day (TID) | SUBCUTANEOUS | 11 refills | Status: DC

## 2019-11-07 NOTE — Progress Notes (Signed)
[facility] [place], [place] [address]  Internal MEDICINE  Office Visit Note  Patient Name: Rachel Nielsen  [id number]  [id number]  Date of Service: 11/07/2019  Chief Complaint  Patient presents with  . Annual Exam  . Diabetes  . Gastroesophageal Reflux  . Hypertension     HPI Pt is here for routine health maintenance examination. She is a well appearing 69 yo AA female. She has a history of DM, HTN , GERD and depression.  Pt reports overall she is doing well.  She was recently started on novolog insulin 5 units TID with meals.  She reports her blood sugars seem to be coming down.  This morning she was 180 mg/dl at home. Will likely need to be increased.  She continues to take trulicty, and basaglar as well as farxiga. Her GERD and depression appear controlled at this time.    Current Medication: Outpatient Encounter Medications as of 11/07/2019  Medication Sig  . Accu-Chek FastClix Lancets MISC Use as directed twice daily diag e11.65  . ACCU-CHEK GUIDE test strip Use twice a daily diag e11.65  . ALPRAZolam (XANAX) 0.5 MG tablet Take 0.5-1 mg by mouth See admin instructions. Take 1 tablet (0.5mg ) by mouth twice daily and 2 tablets (1mg ) by mouth at bedtime  . amLODipine (NORVASC) 5 MG tablet Take 1 tablet (5 mg total) by mouth daily.  . bisoprolol (ZEBETA) 10 MG tablet Take 1 tablet (10 mg total) by mouth daily.  Marland Kitchen buPROPion (WELLBUTRIN XL) 300 MG 24 hr tablet Take 300 mg by mouth daily.   . busPIRone (BUSPAR) 15 MG tablet Take 15 mg by mouth 2 (two) times daily.   . Dulaglutide (TRULICITY) 1.5 0000000 SOPN inject 1.5 mg once weekly for diabetes  . DULoxetine (CYMBALTA) 60 MG capsule Take 60 mg by mouth daily.   . famotidine (PEPCID) 20 MG tablet Take 20 mg by mouth 2 (two) times daily.  Marland Kitchen FARXIGA 5 MG TABS tablet Take 5 mg by mouth daily.  . fluticasone (FLONASE) 50 MCG/ACT nasal spray ONE SPRAY IN EACH NOSTRIL TWICE DAILY.  . fluticasone  furoate-vilanterol (BREO ELLIPTA) 200-25 MCG/INH AEPB Inhale 1 puff into the lungs daily.  Marland Kitchen gabapentin (NEURONTIN) 300 MG capsule Take 1 capsule (300 mg total) by mouth 2 (two) times daily.  Marland Kitchen GLOBAL EASE INJECT PEN NEEDLES 32G X 4 MM MISC Use as directed with insulin  . Insulin Glargine (BASAGLAR KWIKPEN) 100 UNIT/ML SOPN Inject 0.32 mLs (32 Units total) into the skin daily.  . Insulin Pen Needle (BD PEN NEEDLE NANO U/F) 32G X 4 MM MISC by Does not apply route.  . lactulose (CHRONULAC) 10 GM/15ML solution Take by mouth 2 (two) times daily.  Marland Kitchen LATUDA 20 MG TABS tablet Take 20 mg by mouth daily.   Marland Kitchen LINZESS 290 MCG CAPS capsule Take 1 capsule (290 mcg total) by mouth daily before breakfast.  . meloxicam (MOBIC) 15 MG tablet Take 1 tablet (15 mg total) by mouth at bedtime.  . mupirocin ointment (BACTROBAN) 2 % Apply to affected areas bid for 10 days  . nystatin (MYCOSTATIN) 100000 UNIT/ML suspension Swish and swallow with 41mls TID to QID for thrush  . Olopatadine HCl 0.2 % SOLN Place 1 drop into both eyes daily.   . pantoprazole (PROTONIX) 40 MG tablet Take 1 tablet (40 mg total) by mouth daily.  . ranitidine (ZANTAC) 150 MG tablet Take 150 mg by mouth daily as needed for heartburn.   Marland Kitchen  rosuvastatin (CRESTOR) 10 MG tablet Take 1 tablet (10 mg total) by mouth daily.  Marland Kitchen SAPHRIS 10 MG SUBL Take 10 mg by mouth every evening.   . TRAVATAN Z 0.004 % SOLN ophthalmic solution Place 1 drop into both eyes at bedtime.   . VENTOLIN HFA 108 (90 Base) MCG/ACT inhaler TAKE TWO PUFFS THREE TIMES A DAY.  Marland Kitchen WIXELA INHUB 250-50 MCG/DOSE AEPB INHALE 1 PUFF TWICE DAILY. RINSE MOUTH AFTER EACH USE  . insulin lispro (HUMALOG) 100 UNIT/ML injection Inject 0.05 mLs (5 Units total) into the skin 3 (three) times daily before meals.   No facility-administered encounter medications on file as of 11/07/2019.    Surgical History: Past Surgical History:  Procedure Laterality Date  . APPENDECTOMY    . CHOLECYSTECTOMY    .  COLONOSCOPY WITH PROPOFOL N/A 07/20/2018   Procedure: COLONOSCOPY WITH PROPOFOL;  Surgeon: Toledo, Benay Pike, MD;  Location: [facility] ENDOSCOPY;  Service: Gastroenterology;  Laterality: N/A;  . ESOPHAGOGASTRODUODENOSCOPY N/A 07/20/2018   Procedure: ESOPHAGOGASTRODUODENOSCOPY (EGD);  Surgeon: Toledo, Benay Pike, MD;  Location: [facility] ENDOSCOPY;  Service: Gastroenterology;  Laterality: N/A;  . GASTRECTOMY    . TUBAL LIGATION    . VAGINAL HYSTERECTOMY      Medical History: Past Medical History:  Diagnosis Date  . Anxiety   . Asthma   . Constipation   . COPD (chronic obstructive pulmonary disease) ([place])   . Depression   . Diabetes ([place])   . GERD (gastroesophageal reflux disease)   . Hypertension   . Sleep apnea     Family History: Family History  Problem Relation Age of Onset  . Hypertension Mother   . Atrial fibrillation Mother   . Heart attack Mother   . Hypertension Father   . Stomach cancer Father   . Diabetes Father   . Liver cancer Father   . Diabetes Sister   . Hypertension Sister       Review of Systems  Constitutional: Negative for chills, fatigue and unexpected weight change.  HENT: Negative for congestion, rhinorrhea, sneezing and sore throat.   Eyes: Negative for photophobia, pain and redness.  Respiratory: Negative for cough, chest tightness and shortness of breath.   Cardiovascular: Negative for chest pain and palpitations.  Gastrointestinal: Negative for abdominal pain, constipation, diarrhea, nausea and vomiting.  Endocrine: Negative.   Genitourinary: Negative for dysuria and frequency.  Musculoskeletal: Negative for arthralgias, back pain, joint swelling and neck pain.  Skin: Negative for rash.  Allergic/Immunologic: Negative.   Neurological: Negative for tremors and numbness.  Hematological: Negative for adenopathy. Does not bruise/bleed easily.  Psychiatric/Behavioral: Negative for behavioral problems and sleep disturbance. The patient is not  nervous/anxious.      Vital Signs: BP 138/88   Pulse 91   Temp (!) 95.9 F (35.5 C)   Resp 16   Ht 5\' 4"  (1.626 m)   Wt 208 lb 12.8 oz (94.7 kg)   SpO2 99%   BMI 35.84 kg/m    Physical Exam Vitals and nursing note reviewed.  Constitutional:      General: She is not in acute distress.    Appearance: She is well-developed. She is not diaphoretic.  HENT:     Head: Normocephalic and atraumatic.     Mouth/Throat:     Pharynx: No oropharyngeal exudate.  Eyes:     Pupils: Pupils are equal, round, and reactive to light.  Neck:     Thyroid: No thyromegaly.     Vascular: No JVD.  Trachea: No tracheal deviation.  Cardiovascular:     Rate and Rhythm: Normal rate and regular rhythm.     Heart sounds: Normal heart sounds. No murmur. No friction rub. No gallop.   Pulmonary:     Effort: Pulmonary effort is normal. No respiratory distress.     Breath sounds: Normal breath sounds. No wheezing or rales.  Chest:     Chest wall: No tenderness.     Breasts:        Right: Normal.        Left: Normal.     Comments: Exam Chaperoned by Corlis Hove CMA Abdominal:     Palpations: Abdomen is soft.     Tenderness: There is no abdominal tenderness. There is no guarding.  Musculoskeletal:        General: Normal range of motion.     Cervical back: Normal range of motion and neck supple.  Lymphadenopathy:     Cervical: No cervical adenopathy.  Skin:    General: Skin is warm and dry.  Neurological:     Mental Status: She is alert and oriented to person, place, and time.     Cranial Nerves: No cranial nerve deficit.  Psychiatric:        Behavior: Behavior normal.        Thought Content: Thought content normal.        Judgment: Judgment normal.      LABS: Recent Results (from the past 2160 hour(s))  Comprehensive Metabolic Panel (CMET)     Status: Abnormal   Collection Time: 10/19/19  1:23 PM  Result Value Ref Range   Glucose 88 65 - 99 mg/dL   BUN 19 8 - 27 mg/dL    Creatinine, Ser 1.12 (H) 0.57 - 1.00 mg/dL   GFR calc non Af Amer 51 (L) >59 mL/min/1.73   GFR calc Af Amer 59 (L) >59 mL/min/1.73   BUN/Creatinine Ratio 17 12 - 28   Sodium 141 134 - 144 mmol/L   Potassium 4.6 3.5 - 5.2 mmol/L   Chloride 102 96 - 106 mmol/L   CO2 23 20 - 29 mmol/L   Calcium 9.4 8.7 - 10.3 mg/dL   Total Protein 7.0 6.0 - 8.5 g/dL   Albumin 4.1 3.8 - 4.8 g/dL   Globulin, Total 2.9 1.5 - 4.5 g/dL   Albumin/Globulin Ratio 1.4 1.2 - 2.2   Bilirubin Total 0.4 0.0 - 1.2 mg/dL   Alkaline Phosphatase 111 39 - 117 IU/L   AST 14 0 - 40 IU/L   ALT 11 0 - 32 IU/L  Magnesium     Status: None   Collection Time: 10/19/19  1:23 PM  Result Value Ref Range   Magnesium 2.2 1.6 - 2.3 mg/dL  POCT HgB A1C     Status: Abnormal   Collection Time: 10/23/19  2:08 PM  Result Value Ref Range   Hemoglobin A1C 8.4 (A) 4.0 - 5.6 %   HbA1c POC (<> result, manual entry)     HbA1c, POC (prediabetic range)     HbA1c, POC (controlled diabetic range)        Assessment/Plan: 1. Encounter for general adult medical examination with abnormal findings Pt is Up to date on PHM.  Her care gaps indicate need for eye exam, she has had an eye exam in the last few months.  She will call and let us know a date to record.  - Lipid Panel With LDL/HDL Ratio - TSH + free T4  2. Uncontrolled type  2 diabetes mellitus with hyperglycemia ([place]) Continue with novolog as discussed.  New sample given. Continue to record blood sugar and bring log to next visit. Encouraged dietary management as well.   - Microalbumin, urine  3. COPD mixed type ([place]) Controlled, continue present management.  4. Hypertension, unspecified type bp initially elevated.  At repeat was 138/88. Continue to monitor.    5. Gastroesophageal reflux disease without esophagitis Continue present management.  6. Depression, unspecified depression type Stable, continue to follow.  7. Class 3 severe obesity due to excess calories with serious  comorbidity and body mass index (BMI) of 40.0 to 44.9 in adult [facility]) Obesity Counseling: Risk Assessment: An assessment of behavioral risk factors was made today and includes lack of exercise sedentary lifestyle, lack of portion control and poor dietary habits.  Risk Modification Advice: She was counseled on portion control guidelines. Restricting daily caloric intake to 1800. The detrimental long term effects of obesity on her health and ongoing poor compliance was also discussed with the patient.  8. Flu vaccine need - Flu Vaccine MDCK QUAD PF  9. Dysuria - UA/M w/rflx Culture, Routine  General Counseling: hazelene mateer understanding of the findings of todays visit and agrees with plan of treatment. I have discussed any further diagnostic evaluation that may be needed or ordered today. We also reviewed her medications today. she has been encouraged to call the office with any questions or concerns that should arise related to todays visit.   Orders Placed This Encounter  Procedures  . Flu Vaccine MDCK QUAD PF  . UA/M w/rflx Culture, Routine  . Microalbumin, urine  . Lipid Panel With LDL/HDL Ratio  . TSH + free T4    Meds ordered this encounter  Medications  . insulin lispro (HUMALOG) 100 UNIT/ML injection    Sig: Inject 0.05 mLs (5 Units total) into the skin 3 (three) times daily before meals.    Dispense:  10 mL    Refill:  11    Time spent: 25 Minutes   This patient was seen by Orson Gear AGNP-C in Collaboration with Dr Lavera Guise as a part of collaborative care agreement    Kendell Bane AGNP-C Internal Medicine

## 2019-11-08 ENCOUNTER — Telehealth: Payer: Self-pay

## 2019-11-08 NOTE — Telephone Encounter (Signed)
PRIOR AUTHORIZATION FOR HUMALOG HAS BEEN APPROVED UNTIL 11/01/2020.Rachel Nielsen

## 2019-11-10 LAB — MICROSCOPIC EXAMINATION: Casts: NONE SEEN /lpf

## 2019-11-10 LAB — UA/M W/RFLX CULTURE, ROUTINE
Bilirubin, UA: NEGATIVE
Leukocytes,UA: NEGATIVE
Nitrite, UA: POSITIVE — AB
Protein,UA: NEGATIVE
RBC, UA: NEGATIVE
Specific Gravity, UA: >=1.03 — AB (ref 1.005–1.030)
Urobilinogen, Ur: 1 mg/dL (ref 0.2–1.0)
pH, UA: 5.5 (ref 5.0–7.5)

## 2019-11-10 LAB — URINE CULTURE, REFLEX

## 2019-11-10 LAB — MICROALBUMIN, URINE: Microalbumin, Urine: 9.5 ug/mL

## 2019-11-13 ENCOUNTER — Other Ambulatory Visit: Payer: Self-pay | Admitting: Adult Health

## 2019-11-13 ENCOUNTER — Telehealth: Payer: Self-pay

## 2019-11-13 MED ORDER — NITROFURANTOIN MONOHYD MACRO 100 MG PO CAPS: 100.0000 mg | ORAL_CAPSULE | Freq: Two times a day (BID) | ORAL | 0 refills | Status: DC

## 2019-11-13 NOTE — Telephone Encounter (Signed)
-----   Message from Kendell Bane, NP sent at 11/13/2019  2:43 PM EST ----- E. Coli in urine.  Sent macrobid to her pharmacy.  Sent 7 days worth.  Twice daily for 7 days.  Take with food.  Please let her know.

## 2019-11-13 NOTE — Telephone Encounter (Signed)
Pt advised that urine did showed  uti we send macrobid to phar

## 2019-11-13 NOTE — Progress Notes (Unsigned)
Urine culture positive for e. Coli, sending antibiotic at this time.  Macrobid to pharmacy

## 2019-11-15 ENCOUNTER — Telehealth: Payer: Self-pay

## 2019-11-15 NOTE — Telephone Encounter (Signed)
Confirmed appointment with patient. klh °

## 2019-11-17 ENCOUNTER — Other Ambulatory Visit: Payer: Self-pay

## 2019-11-17 ENCOUNTER — Ambulatory Visit (INDEPENDENT_AMBULATORY_CARE_PROVIDER_SITE_OTHER): Payer: Medicare HMO

## 2019-11-17 DIAGNOSIS — R7989 Other specified abnormal findings of blood chemistry: Secondary | ICD-10-CM

## 2019-11-17 DIAGNOSIS — R944 Abnormal results of kidney function studies: Secondary | ICD-10-CM | POA: Diagnosis not present

## 2019-11-21 ENCOUNTER — Other Ambulatory Visit: Payer: Medicare HMO | Attending: Internal Medicine

## 2019-11-22 ENCOUNTER — Other Ambulatory Visit: Payer: Self-pay | Admitting: Adult Health

## 2019-11-22 ENCOUNTER — Ambulatory Visit: Payer: Medicare HMO | Admitting: Adult Health

## 2019-11-23 ENCOUNTER — Encounter: Admission: RE | Payer: Self-pay | Source: Home / Self Care

## 2019-11-23 ENCOUNTER — Ambulatory Visit: Admission: RE | Admit: 2019-11-23 | Payer: Medicare HMO | Source: Home / Self Care | Admitting: Internal Medicine

## 2019-11-23 SURGERY — COLONOSCOPY WITH PROPOFOL
Anesthesia: General

## 2019-11-24 ENCOUNTER — Other Ambulatory Visit: Payer: Self-pay | Admitting: Adult Health

## 2019-11-24 DIAGNOSIS — E1165 Type 2 diabetes mellitus with hyperglycemia: Secondary | ICD-10-CM

## 2019-11-27 ENCOUNTER — Other Ambulatory Visit: Payer: Self-pay | Admitting: Adult Health

## 2019-11-27 ENCOUNTER — Other Ambulatory Visit: Payer: Self-pay | Admitting: Internal Medicine

## 2019-11-27 DIAGNOSIS — K5900 Constipation, unspecified: Secondary | ICD-10-CM

## 2019-11-30 ENCOUNTER — Other Ambulatory Visit: Payer: Self-pay | Admitting: Internal Medicine

## 2019-12-11 ENCOUNTER — Other Ambulatory Visit: Payer: Self-pay | Admitting: Adult Health

## 2019-12-22 ENCOUNTER — Other Ambulatory Visit: Payer: Self-pay

## 2019-12-22 MED ORDER — TRULICITY 1.5 MG/0.5ML ~~LOC~~ SOAJ: SUBCUTANEOUS | 3 refills | Status: DC

## 2020-01-02 ENCOUNTER — Other Ambulatory Visit: Payer: Self-pay | Admitting: Adult Health

## 2020-01-11 ENCOUNTER — Other Ambulatory Visit: Payer: Self-pay | Admitting: Adult Health

## 2020-01-17 ENCOUNTER — Encounter: Payer: Self-pay | Admitting: Adult Health

## 2020-01-17 ENCOUNTER — Other Ambulatory Visit: Payer: Self-pay

## 2020-01-17 ENCOUNTER — Ambulatory Visit (INDEPENDENT_AMBULATORY_CARE_PROVIDER_SITE_OTHER): Payer: Medicare HMO | Admitting: Adult Health

## 2020-01-17 VITALS — Resp 16 | Ht 64.0 in | Wt 208.0 lb

## 2020-01-17 DIAGNOSIS — K219 Gastro-esophageal reflux disease without esophagitis: Secondary | ICD-10-CM | POA: Diagnosis not present

## 2020-01-17 DIAGNOSIS — E1165 Type 2 diabetes mellitus with hyperglycemia: Secondary | ICD-10-CM

## 2020-01-17 DIAGNOSIS — I10 Essential (primary) hypertension: Secondary | ICD-10-CM | POA: Diagnosis not present

## 2020-01-17 MED ORDER — INSULIN LISPRO 100 UNIT/ML ~~LOC~~ SOLN: 10.0000 [IU] | Freq: Three times a day (TID) | SUBCUTANEOUS | 11 refills | Status: DC

## 2020-01-17 NOTE — Progress Notes (Signed)
[facility] [place], [place] [address]  Internal MEDICINE  Telephone Visit  Patient Name: Rachel Nielsen  [id number]  [id number]  Date of Service: 01/17/2020  I connected with the patient at 959 by telephone and verified the patients identity using two identifiers.   I discussed the limitations, risks, security and privacy concerns of performing an evaluation and management service by telephone and the availability of in person appointments. I also discussed with the patient that there may be a patient responsible charge related to the service.  The patient expressed understanding and agrees to proceed.    Chief Complaint  Patient presents with  . Telephone Assessment  . Telephone Screen  . Dizziness  . Diabetes    sugar has been up    HPI  Pt seen via telephone. She reports a few days ago she became dizzy, and the "room was spinning".  It lasted a few minutes and then it resolved. She reports she has been "out of control" with her diet since christmas.  She has been eating  A lot of food that she should not be eating.  She reports her blood sugars have been around 300 mg/dl daily. She denies any other symptoms or episodes of dizziness.    Current Medication: Outpatient Encounter Medications as of 01/17/2020  Medication Sig  . Accu-Chek FastClix Lancets MISC Use as directed twice daily diag e11.65  . ACCU-CHEK GUIDE test strip Use twice a daily diag e11.65  . ALPRAZolam (XANAX) 0.5 MG tablet Take 0.5-1 mg by mouth See admin instructions. Take 1 tablet (0.5mg ) by mouth twice daily and 2 tablets (1mg ) by mouth at bedtime  . amLODipine (NORVASC) 5 MG tablet Take 1 tablet (5 mg total) by mouth daily.  . bisoprolol (ZEBETA) 10 MG tablet Take 1 tablet (10 mg total) by mouth daily.  Marland Kitchen buPROPion (WELLBUTRIN XL) 300 MG 24 hr tablet Take 300 mg by mouth daily.   . busPIRone (BUSPAR) 15 MG tablet Take 15 mg by mouth 2 (two) times daily.   . Dulaglutide (TRULICITY) 1.5  0000000 SOPN inject 1.5 mg once weekly for diabetes  . DULoxetine (CYMBALTA) 60 MG capsule Take 60 mg by mouth daily.   . famotidine (PEPCID) 20 MG tablet Take 20 mg by mouth 2 (two) times daily.  Marland Kitchen FARXIGA 5 MG TABS tablet Take 5 mg by mouth daily.  . fluticasone (FLONASE) 50 MCG/ACT nasal spray ONE SPRAY IN EACH NOSTRIL TWICE DAILY.  . fluticasone furoate-vilanterol (BREO ELLIPTA) 200-25 MCG/INH AEPB Inhale 1 puff into the lungs daily.  Marland Kitchen gabapentin (NEURONTIN) 300 MG capsule Take 1 capsule (300 mg total) by mouth 2 (two) times daily.  Marland Kitchen GLOBAL EASE INJECT PEN NEEDLES 32G X 4 MM MISC Use as directed with insulin  . Insulin Glargine (BASAGLAR KWIKPEN) 100 UNIT/ML SOPN Inject 0.32 mLs (32 Units total) into the skin daily.  . insulin lispro (HUMALOG) 100 UNIT/ML injection Inject 0.05 mLs (5 Units total) into the skin 3 (three) times daily before meals.  . Insulin Pen Needle (BD PEN NEEDLE NANO U/F) 32G X 4 MM MISC by Does not apply route.  . lactulose (CHRONULAC) 10 GM/15ML solution Take by mouth 2 (two) times daily.  Marland Kitchen LATUDA 20 MG TABS tablet Take 20 mg by mouth daily.   Marland Kitchen LINZESS 290 MCG CAPS capsule Take 1 capsule (290 mcg total) by mouth daily before breakfast.  . meloxicam (MOBIC) 15 MG tablet Take 1 tablet (15 mg total) by mouth  at bedtime.  . mupirocin ointment (BACTROBAN) 2 % Apply to affected areas bid for 10 days  . nitrofurantoin, macrocrystal-monohydrate, (MACROBID) 100 MG capsule Take 1 capsule (100 mg total) by mouth 2 (two) times daily.  Marland Kitchen nystatin (MYCOSTATIN) 100000 UNIT/ML suspension Swish and swallow with 59mls TID to QID for thrush  . Olopatadine HCl 0.2 % SOLN Place 1 drop into both eyes daily.   . pantoprazole (PROTONIX) 40 MG tablet Take 1 tablet (40 mg total) by mouth daily.  . ranitidine (ZANTAC) 150 MG tablet Take 150 mg by mouth daily as needed for heartburn.   . rosuvastatin (CRESTOR) 10 MG tablet Take 1 tablet (10 mg total) by mouth daily.  Marland Kitchen SAPHRIS 10 MG SUBL Take  10 mg by mouth every evening.   . TRAVATAN Z 0.004 % SOLN ophthalmic solution Place 1 drop into both eyes at bedtime.   . VENTOLIN HFA 108 (90 Base) MCG/ACT inhaler TAKE TWO PUFFS THREE TIMES A DAY.  Marland Kitchen WIXELA INHUB 250-50 MCG/DOSE AEPB INHALE 1 PUFF TWICE DAILY RINSE MOUTH AFTER USE   No facility-administered encounter medications on file as of 01/17/2020.    Surgical History: Past Surgical History:  Procedure Laterality Date  . APPENDECTOMY    . CHOLECYSTECTOMY    . COLONOSCOPY WITH PROPOFOL N/A 07/20/2018   Procedure: COLONOSCOPY WITH PROPOFOL;  Surgeon: Toledo, Benay Pike, MD;  Location: [facility] ENDOSCOPY;  Service: Gastroenterology;  Laterality: N/A;  . ESOPHAGOGASTRODUODENOSCOPY N/A 07/20/2018   Procedure: ESOPHAGOGASTRODUODENOSCOPY (EGD);  Surgeon: Toledo, Benay Pike, MD;  Location: [facility] ENDOSCOPY;  Service: Gastroenterology;  Laterality: N/A;  . GASTRECTOMY    . TUBAL LIGATION    . VAGINAL HYSTERECTOMY      Medical History: Past Medical History:  Diagnosis Date  . Anxiety   . Asthma   . Constipation   . COPD (chronic obstructive pulmonary disease) ([place])   . Depression   . Diabetes ([place])   . GERD (gastroesophageal reflux disease)   . Hypertension   . Sleep apnea     Family History: Family History  Problem Relation Age of Onset  . Hypertension Mother   . Atrial fibrillation Mother   . Heart attack Mother   . Hypertension Father   . Stomach cancer Father   . Diabetes Father   . Liver cancer Father   . Diabetes Sister   . Hypertension Sister     Social History   Socioeconomic History  . Marital status: Legally Separated    Spouse name: Not on file  . Number of children: Not on file  . Years of education: Not on file  . Highest education level: Not on file  Occupational History  . Not on file  Tobacco Use  . Smoking status: Former Research scientist (life sciences)  . Smokeless tobacco: Current User    Types: Snuff  . Tobacco comment: 4 months ago  Substance and Sexual Activity  .  Alcohol use: Yes    Comment: monthly or less   . Drug use: No  . Sexual activity: Not on file  Other Topics Concern  . Not on file  Social History Narrative  . Not on file   Social Determinants of Health   Financial Resource Strain:   . Difficulty of Paying Living Expenses:   Food Insecurity:   . Worried About Charity fundraiser in the Last Year:   . Arboriculturist in the Last Year:   Transportation Needs:   . Film/video editor (Medical):   Marland Kitchen  Lack of Transportation (Non-Medical):   Physical Activity:   . Days of Exercise per Week:   . Minutes of Exercise per Session:   Stress:   . Feeling of Stress :   Social Connections:   . Frequency of Communication with Friends and Family:   . Frequency of Social Gatherings with Friends and Family:   . Attends Religious Services:   . Active Member of Clubs or Organizations:   . Attends Archivist Meetings:   Marland Kitchen Marital Status:   Intimate Partner Violence:   . Fear of Current or Ex-Partner:   . Emotionally Abused:   Marland Kitchen Physically Abused:   . Sexually Abused:       Review of Systems  Constitutional: Negative for chills, fatigue and unexpected weight change.  HENT: Negative for congestion, rhinorrhea, sneezing and sore throat.   Eyes: Negative for photophobia, pain and redness.  Respiratory: Negative for cough, chest tightness and shortness of breath.   Cardiovascular: Negative for chest pain and palpitations.  Gastrointestinal: Negative for abdominal pain, constipation, diarrhea, nausea and vomiting.  Endocrine: Negative.   Genitourinary: Negative for dysuria and frequency.  Musculoskeletal: Negative for arthralgias, back pain, joint swelling and neck pain.  Skin: Negative for rash.  Allergic/Immunologic: Negative.   Neurological: Negative for tremors and numbness.  Hematological: Negative for adenopathy. Does not bruise/bleed easily.  Psychiatric/Behavioral: Negative for behavioral problems and sleep  disturbance. The patient is not nervous/anxious.     Vital Signs: Resp 16   Ht 5\' 4"  (1.626 m)   Wt 208 lb (94.3 kg)   BMI 35.70 kg/m    Observation/Objective:  Well sounding, NAD noted.   Assessment/Plan: 1. Uncontrolled type 2 diabetes mellitus with hyperglycemia ([place]) Patient will increase Humalog to 10 units with meals.  Continue basaglar 32 units.  Follow up in office in one week with blood sugar log.   2. Hypertension, unspecified type Unable to assess blood pressure at home.  concerned she may be elevated.  Follow up in office in one week.  3. Gastroesophageal reflux disease without esophagitis Stable, continue current medications.  General Counseling: quynh ruzicka understanding of the findings of today's phone visit and agrees with plan of treatment. I have discussed any further diagnostic evaluation that may be needed or ordered today. We also reviewed her medications today. she has been encouraged to call the office with any questions or concerns that should arise related to todays visit.    No orders of the defined types were placed in this encounter.   No orders of the defined types were placed in this encounter.   Time spent: 30 Minutes  This patient was seen by Orson Gear AGNP-C in Collaboration with Dr Lavera Guise as a part of collaborative care agreement Internal medicine

## 2020-01-18 ENCOUNTER — Other Ambulatory Visit: Payer: Self-pay

## 2020-01-18 MED ORDER — "BD INSULIN SYRINGE U/F 30G X 1/2"" 1 ML MISC": 3 refills | Status: DC

## 2020-01-25 ENCOUNTER — Telehealth: Payer: Self-pay

## 2020-01-25 ENCOUNTER — Other Ambulatory Visit: Payer: Self-pay | Admitting: Adult Health

## 2020-01-25 NOTE — Telephone Encounter (Signed)
Confirmed appointment on 01/29/2020 and screened for covid. klh 

## 2020-01-29 ENCOUNTER — Other Ambulatory Visit: Payer: Self-pay

## 2020-01-29 ENCOUNTER — Encounter: Payer: Self-pay | Admitting: Adult Health

## 2020-01-29 ENCOUNTER — Ambulatory Visit (INDEPENDENT_AMBULATORY_CARE_PROVIDER_SITE_OTHER): Payer: Medicare HMO | Admitting: Adult Health

## 2020-01-29 VITALS — BP 149/80 | HR 83 | Temp 96.7°F | Resp 16 | Ht 64.0 in | Wt 213.0 lb

## 2020-01-29 DIAGNOSIS — I10 Essential (primary) hypertension: Secondary | ICD-10-CM | POA: Diagnosis not present

## 2020-01-29 DIAGNOSIS — R252 Cramp and spasm: Secondary | ICD-10-CM | POA: Diagnosis not present

## 2020-01-29 DIAGNOSIS — E1165 Type 2 diabetes mellitus with hyperglycemia: Secondary | ICD-10-CM

## 2020-01-29 LAB — POCT GLYCOSYLATED HEMOGLOBIN (HGB A1C): Hemoglobin A1C: 9.3 % — AB (ref 4.0–5.6)

## 2020-01-29 MED ORDER — INSULIN LISPRO 100 UNIT/ML ~~LOC~~ SOLN: 12.0000 [IU] | Freq: Three times a day (TID) | SUBCUTANEOUS | 11 refills | Status: DC

## 2020-01-29 NOTE — Progress Notes (Signed)
[facility] [place], [place] [address]  Internal MEDICINE  Office Visit Note  Patient Name: Rachel Nielsen  [id number]  [id number]  Date of Service: 01/29/2020  Chief Complaint  Patient presents with  . Follow-up    [place] results  . Abdominal Cramping    hand,feet,stomach left foot     HPI Patient is here today to follow up on her DM. Her A1C today is 9.3 which has elevated since last reading. She was seen 3/17 for episodes of hyperglycemia and was started in 10 units of Novolog with each meal. She reports that since starting this she has noticed an improvement in her blood sugar levels at home. Continues to report cramps that are basically all over her body. Cramps in her bilateral jaw as well as bilateral upper abdomen are the worse cramps. These cramps have been constant for many years. These cramps could be from her elevated glucose levels. Her potassium level in December 2020 was normal. Reports her diet has been better since last appointment as well. BP well controlled today. Has been suffering with depression, is seen by psychiatry. Reports that she would like to discuss with her psychiatrist to increase or change her medication.   Current Medication: Outpatient Encounter Medications as of 01/29/2020  Medication Sig  . Accu-Chek FastClix Lancets MISC Use as directed twice daily diag e11.65  . ACCU-CHEK GUIDE test strip Use twice a daily diag e11.65  . ALPRAZolam (XANAX) 0.5 MG tablet Take 0.5-1 mg by mouth See admin instructions. Take 1 tablet (0.5mg ) by mouth twice daily and 2 tablets (1mg ) by mouth at bedtime  . amLODipine (NORVASC) 5 MG tablet Take 1 tablet (5 mg total) by mouth daily.  . bisoprolol (ZEBETA) 10 MG tablet Take 1 tablet (10 mg total) by mouth daily.  Marland Kitchen buPROPion (WELLBUTRIN XL) 300 MG 24 hr tablet Take 300 mg by mouth daily.   . busPIRone (BUSPAR) 15 MG tablet Take 15 mg by mouth 2 (two) times daily.   . Dulaglutide (TRULICITY) 1.5  0000000 SOPN inject 1.5 mg once weekly for diabetes  . DULoxetine (CYMBALTA) 60 MG capsule Take 60 mg by mouth daily.   . famotidine (PEPCID) 20 MG tablet Take 20 mg by mouth 2 (two) times daily.  Marland Kitchen FARXIGA 5 MG TABS tablet Take 5 mg by mouth daily.  . fluticasone (FLONASE) 50 MCG/ACT nasal spray ONE SPRAY IN EACH NOSTRIL TWICE DAILY.  . fluticasone furoate-vilanterol (BREO ELLIPTA) 200-25 MCG/INH AEPB Inhale 1 puff into the lungs daily.  Marland Kitchen gabapentin (NEURONTIN) 300 MG capsule Take 1 capsule (300 mg total) by mouth 2 (two) times daily.  Marland Kitchen GLOBAL EASE INJECT PEN NEEDLES 32G X 4 MM MISC Use as directed with insulin  . Insulin Glargine (BASAGLAR KWIKPEN) 100 UNIT/ML SOPN Inject 0.32 mLs (32 Units total) into the skin daily.  . insulin lispro (HUMALOG) 100 UNIT/ML injection Inject 0.1 mLs (10 Units total) into the skin 3 (three) times daily before meals.  . Insulin Pen Needle (BD PEN NEEDLE NANO U/F) 32G X 4 MM MISC by Does not apply route.  . Insulin Syringe-Needle U-100 (B-D INS SYR ULTRAFINE 1CC/30G) 30G X 1/2" 1 ML MISC Use as directed 3 times daily with humalog DX E11.65  . lactulose (CHRONULAC) 10 GM/15ML solution Take by mouth 2 (two) times daily.  Marland Kitchen LATUDA 20 MG TABS tablet Take 20 mg by mouth daily.   Marland Kitchen LINZESS 290 MCG CAPS capsule Take 1 capsule (290 mcg total)  by mouth daily before breakfast.  . meloxicam (MOBIC) 15 MG tablet Take 1 tablet (15 mg total) by mouth at bedtime.  . mupirocin ointment (BACTROBAN) 2 % Apply to affected areas bid for 10 days  . nitrofurantoin, macrocrystal-monohydrate, (MACROBID) 100 MG capsule Take 1 capsule (100 mg total) by mouth 2 (two) times daily.  Marland Kitchen nystatin (MYCOSTATIN) 100000 UNIT/ML suspension Swish and swallow with 61mls TID to QID for thrush  . Olopatadine HCl 0.2 % SOLN Place 1 drop into both eyes daily.   . pantoprazole (PROTONIX) 40 MG tablet Take 1 tablet (40 mg total) by mouth daily.  . ranitidine (ZANTAC) 150 MG tablet Take 150 mg by mouth  daily as needed for heartburn.   . rosuvastatin (CRESTOR) 10 MG tablet Take 1 tablet (10 mg total) by mouth daily.  Marland Kitchen SAPHRIS 10 MG SUBL Take 10 mg by mouth every evening.   . TRAVATAN Z 0.004 % SOLN ophthalmic solution Place 1 drop into both eyes at bedtime.   . VENTOLIN HFA 108 (90 Base) MCG/ACT inhaler TAKE TWO PUFFS THREE TIMES A DAY.  Marland Kitchen WIXELA INHUB 250-50 MCG/DOSE AEPB INHALE 1 PUFF TWICE DAILY RINSE MOUTH AFTER USE   No facility-administered encounter medications on file as of 01/29/2020.    Surgical History: Past Surgical History:  Procedure Laterality Date  . APPENDECTOMY    . CHOLECYSTECTOMY    . COLONOSCOPY WITH PROPOFOL N/A 07/20/2018   Procedure: COLONOSCOPY WITH PROPOFOL;  Surgeon: Toledo, Benay Pike, MD;  Location: [facility] ENDOSCOPY;  Service: Gastroenterology;  Laterality: N/A;  . ESOPHAGOGASTRODUODENOSCOPY N/A 07/20/2018   Procedure: ESOPHAGOGASTRODUODENOSCOPY (EGD);  Surgeon: Toledo, Benay Pike, MD;  Location: [facility] ENDOSCOPY;  Service: Gastroenterology;  Laterality: N/A;  . GASTRECTOMY    . TUBAL LIGATION    . VAGINAL HYSTERECTOMY      Medical History: Past Medical History:  Diagnosis Date  . Anxiety   . Asthma   . Constipation   . COPD (chronic obstructive pulmonary disease) ([place])   . Depression   . Diabetes ([place])   . GERD (gastroesophageal reflux disease)   . Hypertension   . Sleep apnea     Family History: Family History  Problem Relation Age of Onset  . Hypertension Mother   . Atrial fibrillation Mother   . Heart attack Mother   . Hypertension Father   . Stomach cancer Father   . Diabetes Father   . Liver cancer Father   . Diabetes Sister   . Hypertension Sister     Social History   Socioeconomic History  . Marital status: Legally Separated    Spouse name: Not on file  . Number of children: Not on file  . Years of education: Not on file  . Highest education level: Not on file  Occupational History  . Not on file  Tobacco Use  . Smoking  status: Former Research scientist (life sciences)  . Smokeless tobacco: Never Used  . Tobacco comment: 4 months ago  Substance and Sexual Activity  . Alcohol use: Yes    Comment: monthly or less   . Drug use: No  . Sexual activity: Not on file  Other Topics Concern  . Not on file  Social History Narrative  . Not on file   Social Determinants of Health   Financial Resource Strain:   . Difficulty of Paying Living Expenses:   Food Insecurity:   . Worried About Charity fundraiser in the Last Year:   . [place] in the Last  Year:   Transportation Needs:   . Film/video editor (Medical):   Marland Kitchen Lack of Transportation (Non-Medical):   Physical Activity:   . Days of Exercise per Week:   . Minutes of Exercise per Session:   Stress:   . Feeling of Stress :   Social Connections:   . Frequency of Communication with Friends and Family:   . Frequency of Social Gatherings with Friends and Family:   . Attends Religious Services:   . Active Member of Clubs or Organizations:   . Attends Archivist Meetings:   Marland Kitchen Marital Status:   Intimate Partner Violence:   . Fear of Current or Ex-Partner:   . Emotionally Abused:   Marland Kitchen Physically Abused:   . Sexually Abused:       Review of Systems  Constitutional: Negative for chills, fatigue and unexpected weight change.  HENT: Negative for congestion, rhinorrhea, sneezing and sore throat.   Eyes: Negative for photophobia, pain and redness.  Respiratory: Negative for cough, chest tightness and shortness of breath.   Cardiovascular: Negative for chest pain and palpitations.  Gastrointestinal: Negative for abdominal pain, constipation, diarrhea, nausea and vomiting.  Endocrine: Negative.   Genitourinary: Negative for dysuria and frequency.  Musculoskeletal: Negative for arthralgias, back pain, joint swelling and neck pain.       Non-specific all over cramping  Skin: Negative for rash.  Allergic/Immunologic: Negative.   Neurological: Negative for tremors  and numbness.  Hematological: Negative for adenopathy. Does not bruise/bleed easily.  Psychiatric/Behavioral: Negative for behavioral problems and sleep disturbance. The patient is not nervous/anxious.     Vital Signs: BP (!) 149/80   Pulse 83   Temp (!) 96.7 F (35.9 C)   Resp 16   Ht 5\' 4"  (1.626 m)   Wt 213 lb (96.6 kg)   SpO2 96%   BMI 36.56 kg/m    Physical Exam Vitals and nursing note reviewed.  Constitutional:      General: She is not in acute distress.    Appearance: She is well-developed. She is not diaphoretic.  HENT:     Head: Normocephalic and atraumatic.     Mouth/Throat:     Pharynx: No oropharyngeal exudate.  Eyes:     Pupils: Pupils are equal, round, and reactive to light.  Neck:     Thyroid: No thyromegaly.     Vascular: No JVD.     Trachea: No tracheal deviation.  Cardiovascular:     Rate and Rhythm: Normal rate and regular rhythm.     Heart sounds: Normal heart sounds. No murmur. No friction rub. No gallop.   Pulmonary:     Effort: Pulmonary effort is normal. No respiratory distress.     Breath sounds: Normal breath sounds. No wheezing or rales.  Chest:     Chest wall: No tenderness.  Abdominal:     Palpations: Abdomen is soft.     Tenderness: There is no abdominal tenderness. There is no guarding.  Musculoskeletal:        General: Normal range of motion.     Cervical back: Normal range of motion and neck supple.  Lymphadenopathy:     Cervical: No cervical adenopathy.  Skin:    General: Skin is warm and dry.  Neurological:     Mental Status: She is alert and oriented to person, place, and time.     Cranial Nerves: No cranial nerve deficit.  Psychiatric:        Behavior: Behavior normal.  Thought Content: Thought content normal.        Judgment: Judgment normal.     Assessment/Plan: 1. Uncontrolled type 2 diabetes mellitus with hyperglycemia (HCC) Increase Humalog to 12 units before each meal. Will have patient call with home  blood glucose log in 3 weeks to adjust medications as appropriate. - POCT HgB A1C - insulin lispro (HUMALOG) 100 UNIT/ML injection; Inject 0.12 mLs (12 Units total) into the skin 3 (three) times daily before meals.  Dispense: 10 mL; Refill: 11  2. Hypertension, unspecified type Stable at this time on current therapy, continue to monitor.  3. Cramps, muscle, general Would like to see if cramps improve once we get blood glucose levels lowered and stabilized. Will continue to monitor.  General Counseling: delesia robert understanding of the findings of todays visit and agrees with plan of treatment. I have discussed any further diagnostic evaluation that may be needed or ordered today. We also reviewed her medications today. she has been encouraged to call the office with any questions or concerns that should arise related to todays visit.    Orders Placed This Encounter  Procedures  . POCT HgB A1C    No orders of the defined types were placed in this encounter.   Time spent: 20 Minutes   This patient was seen by Orson Gear AGNP-C in Collaboration with Dr Lavera Guise as a part of collaborative care agreement     Kendell Bane AGNP-C Internal medicine

## 2020-02-05 ENCOUNTER — Ambulatory Visit: Payer: Medicare HMO | Admitting: Adult Health

## 2020-02-07 DIAGNOSIS — F3189 Other bipolar disorder: Secondary | ICD-10-CM | POA: Diagnosis not present

## 2020-02-07 DIAGNOSIS — F4310 Post-traumatic stress disorder, unspecified: Secondary | ICD-10-CM | POA: Diagnosis not present

## 2020-02-23 ENCOUNTER — Other Ambulatory Visit: Payer: Self-pay

## 2020-02-23 DIAGNOSIS — E1165 Type 2 diabetes mellitus with hyperglycemia: Secondary | ICD-10-CM

## 2020-02-23 MED ORDER — ROSUVASTATIN CALCIUM 10 MG PO TABS: 10.0000 mg | ORAL_TABLET | Freq: Every day | ORAL | 1 refills | Status: DC

## 2020-02-27 ENCOUNTER — Other Ambulatory Visit: Payer: Self-pay

## 2020-02-27 ENCOUNTER — Other Ambulatory Visit: Payer: Self-pay | Admitting: Adult Health

## 2020-02-27 DIAGNOSIS — I10 Essential (primary) hypertension: Secondary | ICD-10-CM

## 2020-02-27 MED ORDER — FLUTICASONE PROPIONATE 50 MCG/ACT NA SUSP: NASAL | 5 refills | Status: DC

## 2020-03-01 DIAGNOSIS — R42 Dizziness and giddiness: Secondary | ICD-10-CM | POA: Diagnosis not present

## 2020-03-01 DIAGNOSIS — E119 Type 2 diabetes mellitus without complications: Secondary | ICD-10-CM | POA: Diagnosis not present

## 2020-03-01 DIAGNOSIS — K121 Other forms of stomatitis: Secondary | ICD-10-CM | POA: Diagnosis not present

## 2020-03-04 ENCOUNTER — Other Ambulatory Visit: Payer: Self-pay | Admitting: Internal Medicine

## 2020-03-19 ENCOUNTER — Telehealth: Payer: Self-pay

## 2020-03-19 NOTE — Telephone Encounter (Signed)
Spoke with [facility] we accidentally  esend pres for basalgar  For 1 pen it should be 1 box and delivered pt 1 box dr Humphrey Rolls rewrite pres man and gave to [employer]

## 2020-03-26 ENCOUNTER — Other Ambulatory Visit: Payer: Self-pay | Admitting: Internal Medicine

## 2020-03-26 ENCOUNTER — Other Ambulatory Visit: Payer: Self-pay | Admitting: Adult Health

## 2020-03-26 DIAGNOSIS — K5900 Constipation, unspecified: Secondary | ICD-10-CM

## 2020-03-29 ENCOUNTER — Other Ambulatory Visit: Payer: Self-pay | Admitting: Adult Health

## 2020-04-09 ENCOUNTER — Other Ambulatory Visit: Payer: Self-pay | Admitting: Internal Medicine

## 2020-04-09 DIAGNOSIS — Z1231 Encounter for screening mammogram for malignant neoplasm of breast: Secondary | ICD-10-CM

## 2020-04-16 ENCOUNTER — Ambulatory Visit
Admission: RE | Admit: 2020-04-16 | Discharge: 2020-04-16 | Disposition: A | Discharge status: Home / Self Care | Payer: Medicare HMO | Source: Ambulatory Visit | Attending: Internal Medicine | Admitting: Internal Medicine

## 2020-04-16 DIAGNOSIS — Z1231 Encounter for screening mammogram for malignant neoplasm of breast: Secondary | ICD-10-CM | POA: Diagnosis not present

## 2020-04-22 ENCOUNTER — Other Ambulatory Visit: Payer: Self-pay | Admitting: Internal Medicine

## 2020-04-25 ENCOUNTER — Other Ambulatory Visit: Payer: Self-pay | Admitting: Adult Health

## 2020-04-30 ENCOUNTER — Ambulatory Visit: Payer: Medicare HMO | Admitting: Nurse Practitioner

## 2020-05-03 ENCOUNTER — Other Ambulatory Visit: Payer: Self-pay | Admitting: Adult Health

## 2020-05-20 ENCOUNTER — Other Ambulatory Visit: Payer: Self-pay | Admitting: Adult Health

## 2020-05-20 DIAGNOSIS — E1165 Type 2 diabetes mellitus with hyperglycemia: Secondary | ICD-10-CM

## 2020-05-27 ENCOUNTER — Other Ambulatory Visit: Payer: Self-pay | Admitting: Adult Health

## 2020-05-28 ENCOUNTER — Other Ambulatory Visit: Payer: Self-pay | Admitting: Adult Health

## 2020-05-30 ENCOUNTER — Other Ambulatory Visit: Payer: Self-pay | Admitting: Adult Health

## 2020-06-10 ENCOUNTER — Telehealth: Payer: Self-pay

## 2020-06-10 ENCOUNTER — Other Ambulatory Visit: Payer: Self-pay | Admitting: Internal Medicine

## 2020-06-10 DIAGNOSIS — F3189 Other bipolar disorder: Secondary | ICD-10-CM | POA: Diagnosis not present

## 2020-06-10 DIAGNOSIS — F4310 Post-traumatic stress disorder, unspecified: Secondary | ICD-10-CM | POA: Diagnosis not present

## 2020-06-10 NOTE — Telephone Encounter (Signed)
Please refill this only pt will need follow up in September

## 2020-06-10 NOTE — Telephone Encounter (Signed)
LVM informing pt that we filled her test strips this time but she needs to make an appt for any future refills per DFK.  dbs

## 2020-06-17 ENCOUNTER — Other Ambulatory Visit: Payer: Self-pay | Admitting: Internal Medicine

## 2020-06-17 ENCOUNTER — Ambulatory Visit (INDEPENDENT_AMBULATORY_CARE_PROVIDER_SITE_OTHER): Payer: Medicare HMO | Admitting: Nurse Practitioner

## 2020-06-17 ENCOUNTER — Encounter: Payer: Self-pay | Admitting: Nurse Practitioner

## 2020-06-17 VITALS — BP 137/85 | Temp 98.2°F | Resp 16 | Ht 64.0 in | Wt 213.0 lb

## 2020-06-17 DIAGNOSIS — R05 Cough: Secondary | ICD-10-CM

## 2020-06-17 DIAGNOSIS — J0110 Acute frontal sinusitis, unspecified: Secondary | ICD-10-CM | POA: Insufficient documentation

## 2020-06-17 DIAGNOSIS — R059 Cough, unspecified: Secondary | ICD-10-CM | POA: Insufficient documentation

## 2020-06-17 DIAGNOSIS — R062 Wheezing: Secondary | ICD-10-CM | POA: Diagnosis not present

## 2020-06-17 MED ORDER — BENZONATATE 200 MG PO CAPS: 200.0000 mg | ORAL_CAPSULE | Freq: Two times a day (BID) | ORAL | 0 refills | Status: DC | PRN

## 2020-06-17 MED ORDER — GUAIFENESIN ER 600 MG PO TB12: 1200.0000 mg | ORAL_TABLET | Freq: Two times a day (BID) | ORAL | 1 refills | Status: DC

## 2020-06-17 MED ORDER — AZITHROMYCIN 250 MG PO TABS: ORAL_TABLET | ORAL | 0 refills | Status: DC

## 2020-06-17 MED ORDER — ALBUTEROL SULFATE HFA 108 (90 BASE) MCG/ACT IN AERS: 2.0000 | INHALATION_SPRAY | RESPIRATORY_TRACT | 3 refills | Status: DC | PRN

## 2020-06-17 NOTE — Progress Notes (Signed)
[facility] [place], [place] [address]  Internal MEDICINE  Telephone Visit  Patient Name: Rachel Nielsen  [id number]  [id number]  Date of Service: 06/17/2020  I connected with the patient at 12:28pm by telephone and verified the patients identity using two identifiers.   I discussed the limitations, risks, security and privacy concerns of performing an evaluation and management service by telephone and the availability of in person appointments. I also discussed with the patient that there may be a patient responsible charge related to the service.  The patient expressed understanding and agrees to proceed.    Chief Complaint  Patient presents with  . Telephone Assessment    dry cough,congestion, needs inhaler  . Telephone Screen    The patient has been contacted via telephone for follow up visit due to concerns for spread of novel coronavirus. She presents for acute visit. She states that she has dry, non-productive cough. States that she has been using her inhaler a lot more than usual. She does not have a fever. She states that she does not go out of her house hardly at all. She does not believe she has been exposed to COVID 19.       Current Medication: Outpatient Encounter Medications as of 06/17/2020  Medication Sig  . Accu-Chek FastClix Lancets MISC Use as directed twice daily diag e11.65  . ACCU-CHEK GUIDE test strip Use twice a daily  . ALPRAZolam (XANAX) 0.5 MG tablet Take 0.5-1 mg by mouth See admin instructions. Take 1 tablet (0.5mg ) by mouth twice daily and 2 tablets (1mg ) by mouth at bedtime  . amLODipine (NORVASC) 5 MG tablet Take 1 tablet (5 mg total) by mouth daily.  . bisoprolol (ZEBETA) 10 MG tablet Take 1 tablet (10 mg total) by mouth daily.  Marland Kitchen buPROPion (WELLBUTRIN XL) 300 MG 24 hr tablet Take 300 mg by mouth daily.   . busPIRone (BUSPAR) 15 MG tablet Take 15 mg by mouth 2 (two) times daily.   . Dulaglutide (TRULICITY) 1.5 IH/4.7QQ SOPN  inject 1.5 mg once weekly for diabetes  . DULoxetine (CYMBALTA) 60 MG capsule Take 60 mg by mouth daily.   . famotidine (PEPCID) 20 MG tablet Take 20 mg by mouth 2 (two) times daily.  Marland Kitchen FARXIGA 5 MG TABS tablet Take 5 mg by mouth daily.  . fluticasone (FLONASE) 50 MCG/ACT nasal spray ONE SPRAY IN EACH NOSTRIL TWICE DAILY.  . fluticasone furoate-vilanterol (BREO ELLIPTA) 200-25 MCG/INH AEPB Inhale 1 puff into the lungs daily.  Marland Kitchen gabapentin (NEURONTIN) 300 MG capsule Take 1 capsule (300 mg total) by mouth 2 (two) times daily.  Marland Kitchen GLOBAL EASE INJECT PEN NEEDLES 32G X 4 MM MISC Use as directed with insulin  . Insulin Glargine (BASAGLAR KWIKPEN) 100 UNIT/ML Inject 0.32 mLs (32 Units total) into the skin daily.  . insulin lispro (HUMALOG) 100 UNIT/ML injection Inject 0.12 mLs (12 Units total) into the skin 3 (three) times daily before meals.  . Insulin Pen Needle (BD PEN NEEDLE NANO U/F) 32G X 4 MM MISC by Does not apply route.  . Insulin Syringe-Needle U-100 (B-D INS SYR ULTRAFINE 1CC/30G) 30G X 1/2" 1 ML MISC Use as directed 3 times daily with humalog DX E11.65  . lactulose (CHRONULAC) 10 GM/15ML solution Take by mouth 2 (two) times daily.  Marland Kitchen LATUDA 20 MG TABS tablet Take 20 mg by mouth daily.   Marland Kitchen LINZESS 290 MCG CAPS capsule Take 1 capsule (290 mcg total) by mouth daily before  breakfast.  . meloxicam (MOBIC) 15 MG tablet Take 1 tablet (15 mg total) by mouth at bedtime.  . mupirocin ointment (BACTROBAN) 2 % Apply to affected areas bid for 10 days  . nitrofurantoin, macrocrystal-monohydrate, (MACROBID) 100 MG capsule Take 1 capsule (100 mg total) by mouth 2 (two) times daily.  Marland Kitchen nystatin (MYCOSTATIN) 100000 UNIT/ML suspension Swish and swallow with 76mls TID to QID for thrush  . Olopatadine HCl 0.2 % SOLN Place 1 drop into both eyes daily.   . pantoprazole (PROTONIX) 40 MG tablet Take 1 tablet (40 mg total) by mouth daily.  . ranitidine (ZANTAC) 150 MG tablet Take 150 mg by mouth daily as needed for  heartburn.   . rosuvastatin (CRESTOR) 10 MG tablet Take 1 tablet (10 mg total) by mouth daily.  Marland Kitchen SAPHRIS 10 MG SUBL Take 10 mg by mouth every evening.   . TRAVATAN Z 0.004 % SOLN ophthalmic solution Place 1 drop into both eyes at bedtime.   Grant Ruts INHUB 250-50 MCG/DOSE AEPB INHALE 1 PUFF TWICE DAILY. RINSE MOUTH AFTER USE.  . [DISCONTINUED] albuterol (VENTOLIN HFA) 108 (90 Base) MCG/ACT inhaler TAKE TWO PUFFS THREE TIMES A DAY.  Marland Kitchen albuterol (VENTOLIN HFA) 108 (90 Base) MCG/ACT inhaler Inhale 2 puffs into the lungs every 4 (four) hours as needed for wheezing or shortness of breath.  Marland Kitchen azithromycin (ZITHROMAX) 250 MG tablet z-pack - take as directed for 5 days  . benzonatate (TESSALON) 200 MG capsule Take 1 capsule (200 mg total) by mouth 2 (two) times daily as needed for cough.  Marland Kitchen guaiFENesin (MUCINEX) 600 MG 12 hr tablet Take 2 tablets (1,200 mg total) by mouth 2 (two) times daily.   No facility-administered encounter medications on file as of 06/17/2020.    Surgical History: Past Surgical History:  Procedure Laterality Date  . APPENDECTOMY    . CHOLECYSTECTOMY    . COLONOSCOPY WITH PROPOFOL N/A 07/20/2018   Procedure: COLONOSCOPY WITH PROPOFOL;  Surgeon: Toledo, Benay Pike, MD;  Location: [facility] ENDOSCOPY;  Service: Gastroenterology;  Laterality: N/A;  . ESOPHAGOGASTRODUODENOSCOPY N/A 07/20/2018   Procedure: ESOPHAGOGASTRODUODENOSCOPY (EGD);  Surgeon: Toledo, Benay Pike, MD;  Location: [facility] ENDOSCOPY;  Service: Gastroenterology;  Laterality: N/A;  . GASTRECTOMY    . TUBAL LIGATION    . VAGINAL HYSTERECTOMY      Medical History: Past Medical History:  Diagnosis Date  . Anxiety   . Asthma   . Constipation   . COPD (chronic obstructive pulmonary disease) ([place])   . Depression   . Diabetes ([place])   . GERD (gastroesophageal reflux disease)   . Hypertension   . Sleep apnea     Family History: Family History  Problem Relation Age of Onset  . Hypertension Mother   . Atrial  fibrillation Mother   . Heart attack Mother   . Hypertension Father   . Stomach cancer Father   . Diabetes Father   . Liver cancer Father   . Diabetes Sister   . Hypertension Sister     Social History   Socioeconomic History  . Marital status: Legally Separated    Spouse name: Not on file  . Number of children: Not on file  . Years of education: Not on file  . Highest education level: Not on file  Occupational History  . Not on file  Tobacco Use  . Smoking status: Former Research scientist (life sciences)  . Smokeless tobacco: Never Used  . Tobacco comment: 4 months ago  Vaping Use  . Vaping Use: Some days  Substance and Sexual Activity  . Alcohol use: Not Currently    Comment: monthly or less   . Drug use: No  . Sexual activity: Not on file  Other Topics Concern  . Not on file  Social History Narrative  . Not on file   Social Determinants of Health   Financial Resource Strain:   . Difficulty of Paying Living Expenses:   Food Insecurity:   . Worried About Charity fundraiser in the Last Year:   . Arboriculturist in the Last Year:   Transportation Needs:   . Film/video editor (Medical):   Marland Kitchen Lack of Transportation (Non-Medical):   Physical Activity:   . Days of Exercise per Week:   . Minutes of Exercise per Session:   Stress:   . Feeling of Stress :   Social Connections:   . Frequency of Communication with Friends and Family:   . Frequency of Social Gatherings with Friends and Family:   . Attends Religious Services:   . Active Member of Clubs or Organizations:   . Attends Archivist Meetings:   Marland Kitchen Marital Status:   Intimate Partner Violence:   . Fear of Current or Ex-Partner:   . Emotionally Abused:   Marland Kitchen Physically Abused:   . Sexually Abused:       Review of Systems  Constitutional: Positive for chills and fatigue. Negative for fever.  HENT: Positive for congestion, ear pain, postnasal drip, rhinorrhea and sore throat. Negative for sinus pain and voice change.    Respiratory: Positive for cough and wheezing.   Cardiovascular: Negative for chest pain and palpitations.  Skin: Negative.   Allergic/Immunologic: Positive for environmental allergies.  Neurological: Negative for dizziness and headaches.    Today's Vitals   06/17/20 1055  BP: 137/85  Resp: 16  Temp: 98.2 F (36.8 C)  Weight: 213 lb (96.6 kg)  Height: 5\' 4"  (1.626 m)   Body mass index is 36.56 kg/m.  Observation/Objective:   The patient is alert and oriented. She is pleasant and answers all questions appropriately. Breathing is non-labored. She is in no acute distress at this time. The patient sounds nasally congested. She has dry, non-productive cough.   Assessment/Plan: 1. Acute non-recurrent frontal sinusitis Start zpack. Take as directed for 5 days. Rest and increase fluids. Recommend OTC treatments as needed and as indicated.  - azithromycin (ZITHROMAX) 250 MG tablet; z-pack - take as directed for 5 days  Dispense: 6 tablet; Refill: 0  2. Cough Tessalon perls 200mg  may be taken up to twice daily as needed for cough. Recommend use of mucinex twice daily to help loosen congestion and reduce cough.  - benzonatate (TESSALON) 200 MG capsule; Take 1 capsule (200 mg total) by mouth 2 (two) times daily as needed for cough.  Dispense: 20 capsule; Refill: 0 - guaiFENesin (MUCINEX) 600 MG 12 hr tablet; Take 2 tablets (1,200 mg total) by mouth 2 (two) times daily.  Dispense: 30 tablet; Refill: 1  3. Wheezing Use rescue inhaler as needed and as prescribed.  - albuterol (VENTOLIN HFA) 108 (90 Base) MCG/ACT inhaler; Inhale 2 puffs into the lungs every 4 (four) hours as needed for wheezing or shortness of breath.  Dispense: 18 g; Refill: 3  General Counseling: Keysi verbalizes understanding of the findings of today's phone visit and agrees with plan of treatment. I have discussed any further diagnostic evaluation that may be needed or ordered today. We also reviewed her medications today.  she has  been encouraged to call the office with any questions or concerns that should arise related to todays visit.   This patient was seen by [facility] with Dr Lavera Guise as a part of collaborative care agreement  Meds ordered this encounter  Medications  . azithromycin (ZITHROMAX) 250 MG tablet    Sig: z-pack - take as directed for 5 days    Dispense:  6 tablet    Refill:  0    Order Specific Question:   Supervising Provider    Answer:   Lavera Guise [place]  . albuterol (VENTOLIN HFA) 108 (90 Base) MCG/ACT inhaler    Sig: Inhale 2 puffs into the lungs every 4 (four) hours as needed for wheezing or shortness of breath.    Dispense:  18 g    Refill:  3    Order Specific Question:   Supervising Provider    Answer:   Lavera Guise [[id number]]  . benzonatate (TESSALON) 200 MG capsule    Sig: Take 1 capsule (200 mg total) by mouth 2 (two) times daily as needed for cough.    Dispense:  20 capsule    Refill:  0    Order Specific Question:   Supervising Provider    Answer:   Lavera Guise [[id number]]  . guaiFENesin (MUCINEX) 600 MG 12 hr tablet    Sig: Take 2 tablets (1,200 mg total) by mouth 2 (two) times daily.    Dispense:  30 tablet    Refill:  1    Order Specific Question:   Supervising Provider    Answer:   Lavera Guise [[id number]]    Time spent: 74 Minutes    Dr Lavera Guise Internal medicine

## 2020-06-27 ENCOUNTER — Telehealth: Payer: Self-pay

## 2020-06-27 ENCOUNTER — Other Ambulatory Visit: Payer: Self-pay | Admitting: Adult Health

## 2020-06-27 NOTE — Telephone Encounter (Signed)
Confirmed appt on 07/01/20

## 2020-07-01 ENCOUNTER — Ambulatory Visit: Payer: Medicare HMO | Admitting: Nurse Practitioner

## 2020-07-09 ENCOUNTER — Other Ambulatory Visit: Payer: Self-pay | Admitting: Adult Health

## 2020-07-24 ENCOUNTER — Other Ambulatory Visit: Payer: Self-pay | Admitting: Adult Health

## 2020-07-29 ENCOUNTER — Other Ambulatory Visit: Payer: Self-pay | Admitting: Internal Medicine

## 2020-07-29 ENCOUNTER — Other Ambulatory Visit: Payer: Self-pay | Admitting: Adult Health

## 2020-07-29 ENCOUNTER — Other Ambulatory Visit: Payer: Self-pay

## 2020-07-29 DIAGNOSIS — K5900 Constipation, unspecified: Secondary | ICD-10-CM

## 2020-07-29 MED ORDER — AMLODIPINE BESYLATE 5 MG PO TABS: 5.0000 mg | ORAL_TABLET | Freq: Every day | ORAL | 3 refills | Status: DC

## 2020-07-31 ENCOUNTER — Other Ambulatory Visit: Payer: Self-pay

## 2020-07-31 ENCOUNTER — Other Ambulatory Visit: Payer: Self-pay | Admitting: Adult Health

## 2020-07-31 MED ORDER — GABAPENTIN 300 MG PO CAPS: 300.0000 mg | ORAL_CAPSULE | Freq: Two times a day (BID) | ORAL | 3 refills | Status: DC

## 2020-08-01 ENCOUNTER — Telehealth: Payer: Self-pay

## 2020-08-01 NOTE — Telephone Encounter (Signed)
Pt called in stating that she no longer wanted to be seen here as PCP. She states she has found a new provider. She stated that she wants to cancel her Jan appt as well.

## 2020-08-03 ENCOUNTER — Other Ambulatory Visit: Payer: Self-pay

## 2020-08-03 ENCOUNTER — Emergency Department: Payer: Medicare HMO

## 2020-08-03 ENCOUNTER — Emergency Department
Admission: EM | Admit: 2020-08-03 | Discharge: 2020-08-03 | Disposition: A | Discharge status: Home / Self Care | Payer: Medicare HMO | Attending: Emergency Medicine | Admitting: Emergency Medicine

## 2020-08-03 DIAGNOSIS — J4521 Mild intermittent asthma with (acute) exacerbation: Secondary | ICD-10-CM | POA: Insufficient documentation

## 2020-08-03 DIAGNOSIS — R0602 Shortness of breath: Secondary | ICD-10-CM | POA: Diagnosis not present

## 2020-08-03 DIAGNOSIS — Z7951 Long term (current) use of inhaled steroids: Secondary | ICD-10-CM | POA: Insufficient documentation

## 2020-08-03 DIAGNOSIS — Z79899 Other long term (current) drug therapy: Secondary | ICD-10-CM | POA: Insufficient documentation

## 2020-08-03 DIAGNOSIS — J449 Chronic obstructive pulmonary disease, unspecified: Secondary | ICD-10-CM | POA: Insufficient documentation

## 2020-08-03 DIAGNOSIS — I10 Essential (primary) hypertension: Secondary | ICD-10-CM | POA: Diagnosis not present

## 2020-08-03 DIAGNOSIS — R0902 Hypoxemia: Secondary | ICD-10-CM | POA: Diagnosis not present

## 2020-08-03 DIAGNOSIS — E119 Type 2 diabetes mellitus without complications: Secondary | ICD-10-CM | POA: Diagnosis not present

## 2020-08-03 DIAGNOSIS — Z87891 Personal history of nicotine dependence: Secondary | ICD-10-CM | POA: Insufficient documentation

## 2020-08-03 DIAGNOSIS — J40 Bronchitis, not specified as acute or chronic: Secondary | ICD-10-CM

## 2020-08-03 DIAGNOSIS — Z20822 Contact with and (suspected) exposure to covid-19: Secondary | ICD-10-CM | POA: Diagnosis not present

## 2020-08-03 DIAGNOSIS — J209 Acute bronchitis, unspecified: Secondary | ICD-10-CM | POA: Insufficient documentation

## 2020-08-03 DIAGNOSIS — Z794 Long term (current) use of insulin: Secondary | ICD-10-CM | POA: Diagnosis not present

## 2020-08-03 DIAGNOSIS — G459 Transient cerebral ischemic attack, unspecified: Secondary | ICD-10-CM | POA: Diagnosis not present

## 2020-08-03 DIAGNOSIS — R069 Unspecified abnormalities of breathing: Secondary | ICD-10-CM | POA: Diagnosis not present

## 2020-08-03 DIAGNOSIS — R0600 Dyspnea, unspecified: Secondary | ICD-10-CM | POA: Diagnosis not present

## 2020-08-03 LAB — RESPIRATORY PANEL BY RT PCR (FLU A&B, COVID)
Influenza A by PCR: NEGATIVE
Influenza B by PCR: NEGATIVE
SARS Coronavirus 2 by RT PCR: NEGATIVE

## 2020-08-03 LAB — CBC WITH DIFFERENTIAL/PLATELET
Abs Immature Granulocytes: 0.07 10*3/uL (ref 0.00–0.07)
Basophils Absolute: 0.1 10*3/uL (ref 0.0–0.1)
Basophils Relative: 1 %
Eosinophils Absolute: 1.4 10*3/uL — ABNORMAL HIGH (ref 0.0–0.5)
Eosinophils Relative: 14 %
HCT: 43.1 % (ref 36.0–46.0)
Hemoglobin: 13.2 g/dL (ref 12.0–15.0)
Immature Granulocytes: 1 %
Lymphocytes Relative: 29 %
Lymphs Abs: 2.9 10*3/uL (ref 0.7–4.0)
MCH: 25.5 pg — ABNORMAL LOW (ref 26.0–34.0)
MCHC: 30.6 g/dL (ref 30.0–36.0)
MCV: 83.4 fL (ref 80.0–100.0)
Monocytes Absolute: 0.7 10*3/uL (ref 0.1–1.0)
Monocytes Relative: 7 %
Neutro Abs: 4.9 10*3/uL (ref 1.7–7.7)
Neutrophils Relative %: 48 %
Platelets: 202 10*3/uL (ref 150–400)
RBC: 5.17 MIL/uL — ABNORMAL HIGH (ref 3.87–5.11)
RDW: 17.1 % — ABNORMAL HIGH (ref 11.5–15.5)
WBC: 10 10*3/uL (ref 4.0–10.5)
nRBC: 0 % (ref 0.0–0.2)

## 2020-08-03 LAB — COMPREHENSIVE METABOLIC PANEL
ALT: 16 U/L (ref 0–44)
AST: 17 U/L (ref 15–41)
Albumin: 3.7 g/dL (ref 3.5–5.0)
Alkaline Phosphatase: 84 U/L (ref 38–126)
Anion gap: 8 (ref 5–15)
BUN: 14 mg/dL (ref 8–23)
CO2: 28 mmol/L (ref 22–32)
Calcium: 9.4 mg/dL (ref 8.9–10.3)
Chloride: 103 mmol/L (ref 98–111)
Creatinine, Ser: 1.12 mg/dL — ABNORMAL HIGH (ref 0.44–1.00)
GFR calc Af Amer: 59 mL/min — ABNORMAL LOW (ref >60)
GFR calc non Af Amer: 51 mL/min — ABNORMAL LOW (ref >60)
Glucose, Bld: 168 mg/dL — ABNORMAL HIGH (ref 70–99)
Potassium: 3.8 mmol/L (ref 3.5–5.1)
Sodium: 139 mmol/L (ref 135–145)
Total Bilirubin: 0.5 mg/dL (ref 0.3–1.2)
Total Protein: 7.2 g/dL (ref 6.5–8.1)

## 2020-08-03 LAB — TROPONIN I (HIGH SENSITIVITY)
Troponin I (High Sensitivity): 8 ng/L (ref <18)
Troponin I (High Sensitivity): 10 ng/L (ref <18)

## 2020-08-03 LAB — GLUCOSE, CAPILLARY: Glucose-Capillary: 114 mg/dL — ABNORMAL HIGH (ref 70–99)

## 2020-08-03 MED ORDER — AZITHROMYCIN 250 MG PO TABS: ORAL_TABLET | ORAL | 0 refills | Status: AC

## 2020-08-03 MED ORDER — IPRATROPIUM-ALBUTEROL 0.5-2.5 (3) MG/3ML IN SOLN
3.0000 mL | Freq: Once | RESPIRATORY_TRACT | Status: AC
  Administered 2020-08-03: 3 mL via RESPIRATORY_TRACT
  Filled 2020-08-03: qty 3

## 2020-08-03 MED ORDER — INSULIN ASPART 100 UNIT/ML ~~LOC~~ SOLN: 10.0000 [IU] | Freq: Once | SUBCUTANEOUS | Status: DC

## 2020-08-03 MED ORDER — METHYLPREDNISOLONE SODIUM SUCC 125 MG IJ SOLR
125.0000 mg | Freq: Once | INTRAMUSCULAR | Status: AC
  Administered 2020-08-03: 125 mg via INTRAVENOUS
  Filled 2020-08-03: qty 2

## 2020-08-03 MED ORDER — ALBUTEROL SULFATE HFA 108 (90 BASE) MCG/ACT IN AERS: 2.0000 | INHALATION_SPRAY | Freq: Four times a day (QID) | RESPIRATORY_TRACT | 2 refills | Status: DC | PRN

## 2020-08-03 MED ORDER — IPRATROPIUM-ALBUTEROL 0.5-2.5 (3) MG/3ML IN SOLN
3.0000 mL | Freq: Once | RESPIRATORY_TRACT | Status: AC
  Administered 2020-08-03: 3 mL via RESPIRATORY_TRACT

## 2020-08-03 MED ORDER — IPRATROPIUM-ALBUTEROL 0.5-2.5 (3) MG/3ML IN SOLN
3.0000 mL | Freq: Once | RESPIRATORY_TRACT | Status: AC
  Administered 2020-08-03: 3 mL via RESPIRATORY_TRACT
  Filled 2020-08-03: qty 6

## 2020-08-03 NOTE — ED Notes (Signed)
[place] sandwich tray provided to pt. Daughter at bedside.

## 2020-08-03 NOTE — ED Triage Notes (Signed)
Short of breath for several weeks, had televisit and received antibiotics and inhaler.  Reports continues short of breath especially with exertion.  EMS interventions -- pulse oxi 95% on room air, up to 98% with O2 at 2 liters, CO2 48; CBG 181; hr 68; bp 156/92.

## 2020-08-03 NOTE — ED Notes (Signed)
Pt has asthma, states last 2-3 weeks has had increasing SOB and wheezing. States non-productive cough since 2-3 weeks also. Pt 96% SPO2 on RA. Wheezing heard all lung fields.

## 2020-08-03 NOTE — ED Notes (Signed)
Called RT for peak flow per EDP order.

## 2020-08-03 NOTE — Discharge Instructions (Addendum)
Use your albuterol 2 puffs 4 times a day as needed.  Take the Zithromax 2 today and 1 every day after that.  As an antibiotic that should help with the cough.  Please return if you get worse at all.  This includes fever or shortness of breath or increasing cough.  Follow-up with your regular doctor in the next 3 to 4 days.  Covid test was negative today.

## 2020-08-03 NOTE — Progress Notes (Signed)
FVC 1.49L     FEV 1 .99L    FEV1/FVC 66.3%    FEF 25/75 .36L/s    PEF3.63L/s

## 2020-08-03 NOTE — ED Provider Notes (Signed)
[facility] Emergency Department Provider Note   ____________________________________________   First MD Initiated Contact with Patient 08/03/20 1150     (approximate)  I have reviewed the triage vital signs and the nursing notes.   HISTORY  Chief Complaint Shortness of Breath    HPI Rachel Nielsen is a 69 y.o. female who reports she has been short of breath for a few weeks.  She had a televisit got antibiotics and inhaler but she continues to be short of breath especially with exertion.  She is currently wheezing.  She has a dry nonproductive cough.  She had a fever couple weeks ago but none since.  She can smell and taste things easily.  She has no other complaints.         Past Medical History:  Diagnosis Date  . Anxiety   . Asthma   . Constipation   . COPD (chronic obstructive pulmonary disease) ([place])   . Depression   . Diabetes ([place])   . GERD (gastroesophageal reflux disease)   . Hypertension   . Sleep apnea     Patient Active Problem List   Diagnosis Date Noted  . Acute non-recurrent frontal sinusitis 06/17/2020  . Cough 06/17/2020  . Wheezing 06/17/2020  . Furuncle of axilla 03/14/2019  . Uncontrolled type 2 diabetes mellitus with hyperglycemia ([place]) 03/14/2019  . Sepsis ([place]) 11/17/2018  . Hypertension 12/21/2017  . Type 2 diabetes mellitus with hyperglycemia ([place]) 12/21/2017  . GERD (gastroesophageal reflux disease) 12/21/2017  . Constipation 12/21/2017  . Depression 12/21/2017  . Anxiety 12/21/2017    Past Surgical History:  Procedure Laterality Date  . APPENDECTOMY    . CHOLECYSTECTOMY    . COLONOSCOPY WITH PROPOFOL N/A 07/20/2018   Procedure: COLONOSCOPY WITH PROPOFOL;  Surgeon: Toledo, Benay Pike, MD;  Location: [facility] ENDOSCOPY;  Service: Gastroenterology;  Laterality: N/A;  . ESOPHAGOGASTRODUODENOSCOPY N/A 07/20/2018   Procedure: ESOPHAGOGASTRODUODENOSCOPY (EGD);  Surgeon: Toledo, Benay Pike, MD;  Location: [facility] ENDOSCOPY;   Service: Gastroenterology;  Laterality: N/A;  . GASTRECTOMY    . TUBAL LIGATION    . VAGINAL HYSTERECTOMY      Prior to Admission medications   Medication Sig Start Date End Date Taking? Authorizing Provider  Accu-Chek FastClix Lancets MISC Use as directed twice daily diag e11.65 06/17/20   Kendell Bane, NP  ACCU-CHEK GUIDE test strip Use twice a daily 07/29/20   Lavera Guise, MD  albuterol (VENTOLIN HFA) 108 (90 Base) MCG/ACT inhaler Inhale 2 puffs into the lungs every 4 (four) hours as needed for wheezing or shortness of breath. 06/17/20   Ronnell Freshwater, NP  albuterol (VENTOLIN HFA) 108 (90 Base) MCG/ACT inhaler Inhale 2 puffs into the lungs every 6 (six) hours as needed for wheezing or shortness of breath. 08/03/20   Nena Polio, MD  ALPRAZolam Duanne Moron) 0.5 MG tablet Take 0.5-1 mg by mouth See admin instructions. Take 1 tablet (0.5mg ) by mouth twice daily and 2 tablets (1mg ) by mouth at bedtime    [provider]  amLODipine (NORVASC) 5 MG tablet Take 1 tablet (5 mg total) by mouth daily. 07/29/20   Ronnell Freshwater, NP  azithromycin (ZITHROMAX Z-PAK) 250 MG tablet Take 2 tablets (500 mg) on  Day 1,  followed by 1 tablet (250 mg) once daily on Days 2 through 5. 08/03/20 08/08/20  Nena Polio, MD  azithromycin (ZITHROMAX) 250 MG tablet z-pack - take as directed for 5 days 06/17/20   Ronnell Freshwater,  NP  benzonatate (TESSALON) 200 MG capsule Take 1 capsule (200 mg total) by mouth 2 (two) times daily as needed for cough. 06/17/20   Ronnell Freshwater, NP  bisoprolol (ZEBETA) 10 MG tablet Take 1 tablet (10 mg total) by mouth daily. 02/27/20   Ronnell Freshwater, NP  buPROPion (WELLBUTRIN XL) 300 MG 24 hr tablet Take 300 mg by mouth daily.     [provider]  busPIRone (BUSPAR) 15 MG tablet Take 15 mg by mouth 2 (two) times daily.  11/08/17   [provider]  Dulaglutide (TRULICITY) 1.5 TO/6.7TI SOPN inject 1.5 mg once weekly for diabetes 12/22/19   Ronnell Freshwater, NP  DULoxetine (CYMBALTA) 60 MG capsule Take 60 mg by mouth daily.  11/08/17   [provider]  famotidine (PEPCID) 20 MG tablet Take 20 mg by mouth 2 (two) times daily.    [provider]  FARXIGA 5 MG TABS tablet Take 5 mg by mouth daily. 07/24/20   Kendell Bane, NP  fluticasone (FLONASE) 50 MCG/ACT nasal spray ONE SPRAY IN EACH NOSTRIL TWICE DAILY. 02/27/20   Scarboro, Audie Clear, NP  fluticasone furoate-vilanterol (BREO ELLIPTA) 200-25 MCG/INH AEPB Inhale 1 puff into the lungs daily. 09/28/18   Ronnell Freshwater, NP  gabapentin (NEURONTIN) 300 MG capsule Take 1 capsule (300 mg total) by mouth 2 (two) times daily. 07/31/20   Ronnell Freshwater, NP  GLOBAL EASE INJECT PEN NEEDLES 32G X 4 MM MISC Use as directed with insulin 05/30/20   Scarboro, Audie Clear, NP  guaiFENesin (MUCINEX) 600 MG 12 hr tablet Take 2 tablets (1,200 mg total) by mouth 2 (two) times daily. 06/17/20   Ronnell Freshwater, NP  Insulin Glargine (BASAGLAR KWIKPEN) 100 UNIT/ML Inject 0.32 mLs (32 Units total) into the skin daily. 05/20/20   Scarboro, Audie Clear, NP  insulin lispro (HUMALOG) 100 UNIT/ML injection Inject 0.12 mLs (12 Units total) into the skin 3 (three) times daily before meals. 01/29/20   Scarboro, Audie Clear, NP  Insulin Pen Needle (BD PEN NEEDLE NANO U/F) 32G X 4 MM MISC by Does not apply route.    [provider]  Insulin Syringe-Needle U-100 (GLOBAL INJECT EASE INSULIN SYR) 31G X 5/16" 1 ML MISC Use as directed 3 times daily with humalog DX E11.65 07/31/20   Lavera Guise, MD  lactulose [facility]) 10 GM/15ML solution Take by mouth 2 (two) times daily.    [provider]  LATUDA 20 MG TABS tablet Take 20 mg by mouth daily.  11/09/17   [provider]  LINZESS 290 MCG CAPS capsule Take 1 capsule (290 mcg total) by mouth daily before breakfast. 07/29/20   Kendell Bane, NP  meloxicam (MOBIC) 15 MG tablet Take 1 tablet (15 mg total) by mouth at bedtime. 07/29/20   Kendell Bane, NP    mupirocin ointment (BACTROBAN) 2 % Apply to affected areas bid for 10 days 05/04/19   Ronnell Freshwater, NP  nitrofurantoin, macrocrystal-monohydrate, (MACROBID) 100 MG capsule Take 1 capsule (100 mg total) by mouth 2 (two) times daily. 11/13/19   Kendell Bane, NP  nystatin (MYCOSTATIN) 100000 UNIT/ML suspension Swish and swallow with 14mls TID to QID for thrush 04/28/19   Boscia, Heather E, NP  Olopatadine HCl 0.2 % SOLN Place 1 drop into both eyes daily.     [provider]  pantoprazole (PROTONIX) 40 MG tablet Take 1 tablet (40 mg total) by mouth daily. 06/27/19   Scarboro,  Audie Clear, NP  ranitidine (ZANTAC) 150 MG tablet Take 150 mg by mouth daily as needed for heartburn.     [provider]  rosuvastatin (CRESTOR) 10 MG tablet Take 1 tablet (10 mg total) by mouth daily. 02/23/20   Scarboro, Audie Clear, NP  SAPHRIS 10 MG SUBL Take 10 mg by mouth every evening.  12/10/17   [provider]  TRAVATAN Z 0.004 % SOLN ophthalmic solution Place 1 drop into both eyes at bedtime.     [provider]  Grant Ruts INHUB 250-50 MCG/DOSE AEPB INHALE 1 PUFF TWICE DAILY. RINSE MOUTH AFTER USE. 05/27/20   Kendell Bane, NP    Allergies Ace inhibitors  Family History  Problem Relation Age of Onset  . Hypertension Mother   . Atrial fibrillation Mother   . Heart attack Mother   . Hypertension Father   . Stomach cancer Father   . Diabetes Father   . Liver cancer Father   . Diabetes Sister   . Hypertension Sister     Social History Social History   Tobacco Use  . Smoking status: Former Research scientist (life sciences)  . Smokeless tobacco: Never Used  . Tobacco comment: 4 months ago  Vaping Use  . Vaping Use: Some days  Substance Use Topics  . Alcohol use: Not Currently    Comment: monthly or less   . Drug use: No    Review of Systems  Constitutional: No fever/chills Eyes: No visual changes. ENT: No sore throat. Cardiovascular: Denies chest pain. Respiratory: shortness of  breath. Gastrointestinal: No abdominal pain.  No nausea, no vomiting.  No diarrhea.  No constipation. Genitourinary: Negative for dysuria. Musculoskeletal: Negative for back pain. Skin: Negative for rash. Neurological: Negative for headaches, focal weakness   ____________________________________________   PHYSICAL EXAM:  VITAL SIGNS: ED Triage Vitals  Enc Vitals Group     BP 08/03/20 0631 (!) 163/83     Pulse Rate 08/03/20 0631 91     Resp 08/03/20 0631 19     Temp 08/03/20 0631 98.2 F (36.8 C)     Temp Source 08/03/20 0631 Oral     SpO2 08/03/20 0631 95 %     Weight 08/03/20 0632 218 lb (98.9 kg)     Height 08/03/20 0632 5\' 4"  (1.626 m)     Head Circumference --      Peak Flow --      Pain Score 08/03/20 0632 0     Pain Loc --      Pain Edu? --      Excl. in [place]? --     Constitutional: Alert and oriented. Well appearing and in no acute distress. Eyes: Conjunctivae are normal. PER Head: Atraumatic. Nose: No congestion/rhinnorhea. Mouth/Throat: Mucous membranes are moist.  Oropharynx non-erythematous. Neck: No stridor.   Cardiovascular: Normal rate, regular rhythm. Grossly normal heart sounds.  Good peripheral circulation. Respiratory: Normal respiratory effort.  No retractions. Lungs wheezes throughout Gastrointestinal: Soft and nontender. No distention. No abdominal bruits.  Musculoskeletal: No lower extremity tenderness nor edema.  Neurologic:  Normal speech and language. No gross focal neurologic deficits are appreciated.  Skin:  Skin is warm, dry and intact. No rash noted.   ____________________________________________   LABS (all labs ordered are listed, but only abnormal results are displayed)  Labs Reviewed  CBC WITH DIFFERENTIAL/PLATELET - Abnormal; Notable for the following components:      Result Value   RBC 5.17 (*)    MCH 25.5 (*)    RDW 17.1 (*)  Eosinophils Absolute 1.4 (*)    All other components within normal limits  COMPREHENSIVE METABOLIC  PANEL - Abnormal; Notable for the following components:   Glucose, Bld 168 (*)    Creatinine, Ser 1.12 (*)    GFR calc non Af Amer 51 (*)    GFR calc Af Amer 59 (*)    All other components within normal limits  GLUCOSE, CAPILLARY - Abnormal; Notable for the following components:   Glucose-Capillary 114 (*)    All other components within normal limits  RESPIRATORY PANEL BY RT PCR (FLU A&B, COVID)  CBG MONITORING, ED  TROPONIN I (HIGH SENSITIVITY)  TROPONIN I (HIGH SENSITIVITY)   ____________________________________________  EKG EKG read interpreted by me shows normal sinus rhythm rate of 90 normal axis  Biphasic T waves in V4 5 with flipped T waves in V6 ____________________________________________  RADIOLOGY  ED MD interpretation: Chest x-ray read by radiology reviewed by me shows no acute disease  Official radiology report(s): DG Chest 2 View  Result Date: 08/03/2020 CLINICAL DATA:  Dyspnea for several weeks EXAM: CHEST - 2 VIEW COMPARISON:  11/18/2018 chest radiograph. FINDINGS: Stable cardiomediastinal silhouette with normal heart size. No pneumothorax. No pleural effusion. Lungs appear clear, with no acute consolidative airspace disease and no pulmonary edema. Chronic mild eventration of the right hemidiaphragm. IMPRESSION: No active cardiopulmonary disease. Electronically Signed   By: Ilona Sorrel M.D.   On: 08/03/2020 09:05    ____________________________________________   PROCEDURES  Procedure(s) performed (including Critical Care):  Procedures   ____________________________________________   INITIAL IMPRESSION / ASSESSMENT AND PLAN / ED COURSE  Patient doing much better after 3 duo nebs and her Solu-Medrol.  We will give her some albuterol and Zithromax to go.  She is diabetic apparently EMS picked up all her meds and she does not have any of her meds left because they disappeared somewhere between the ambulance in the waiting room I do not want to give her any  steroids.  She thinks she can hold on until Monday at which time she thinks she can get them refilled.  It is possible that EMS may have left the meds at home.  We will check that first if there are not any meds at home then we will have to call in at least the insulin somewhere for a couple days.  I have signed out this problem.              ____________________________________________   FINAL CLINICAL IMPRESSION(S) / ED DIAGNOSES  Final diagnoses:  Mild intermittent asthma with exacerbation  Bronchitis     ED Discharge Orders         Ordered    albuterol (VENTOLIN HFA) 108 (90 Base) MCG/ACT inhaler  Every 6 hours PRN        08/03/20 1622    azithromycin (ZITHROMAX Z-PAK) 250 MG tablet        08/03/20 1622          *Please note:  Rachel Nielsen was evaluated in Emergency Department on 08/03/2020 for the symptoms described in the history of present illness. She was evaluated in the context of the global COVID-19 pandemic, which necessitated consideration that the patient might be at risk for infection with the SARS-CoV-2 virus that causes COVID-19. Institutional protocols and algorithms that pertain to the evaluation of patients at risk for COVID-19 are in a state of rapid change based on information released by regulatory bodies including the CDC and federal and state organizations. These  policies and algorithms were followed during the patient's care in the ED.  Some ED evaluations and interventions may be delayed as a result of limited staffing during and the pandemic.*   Note:  This document was prepared using Dragon voice recognition software and may include unintentional dictation errors.    Nena Polio, MD 08/03/20 [phone]

## 2020-08-10 ENCOUNTER — Other Ambulatory Visit: Payer: Self-pay

## 2020-08-10 ENCOUNTER — Emergency Department
Admission: EM | Admit: 2020-08-10 | Discharge: 2020-08-11 | Disposition: A | Discharge status: Home / Self Care | Payer: Medicare HMO | Attending: Emergency Medicine | Admitting: Emergency Medicine

## 2020-08-10 ENCOUNTER — Emergency Department: Payer: Medicare HMO

## 2020-08-10 ENCOUNTER — Encounter: Payer: Self-pay | Admitting: Emergency Medicine

## 2020-08-10 DIAGNOSIS — J441 Chronic obstructive pulmonary disease with (acute) exacerbation: Secondary | ICD-10-CM | POA: Diagnosis not present

## 2020-08-10 DIAGNOSIS — Z8616 Personal history of COVID-19: Secondary | ICD-10-CM | POA: Diagnosis not present

## 2020-08-10 DIAGNOSIS — R062 Wheezing: Secondary | ICD-10-CM | POA: Diagnosis not present

## 2020-08-10 DIAGNOSIS — J45909 Unspecified asthma, uncomplicated: Secondary | ICD-10-CM | POA: Insufficient documentation

## 2020-08-10 DIAGNOSIS — R0689 Other abnormalities of breathing: Secondary | ICD-10-CM | POA: Diagnosis not present

## 2020-08-10 DIAGNOSIS — I10 Essential (primary) hypertension: Secondary | ICD-10-CM | POA: Diagnosis not present

## 2020-08-10 DIAGNOSIS — Z20822 Contact with and (suspected) exposure to covid-19: Secondary | ICD-10-CM | POA: Diagnosis not present

## 2020-08-10 DIAGNOSIS — Z79899 Other long term (current) drug therapy: Secondary | ICD-10-CM | POA: Diagnosis not present

## 2020-08-10 DIAGNOSIS — Z87891 Personal history of nicotine dependence: Secondary | ICD-10-CM | POA: Insufficient documentation

## 2020-08-10 DIAGNOSIS — E1165 Type 2 diabetes mellitus with hyperglycemia: Secondary | ICD-10-CM | POA: Insufficient documentation

## 2020-08-10 DIAGNOSIS — Z794 Long term (current) use of insulin: Secondary | ICD-10-CM | POA: Insufficient documentation

## 2020-08-10 DIAGNOSIS — R0602 Shortness of breath: Secondary | ICD-10-CM | POA: Diagnosis not present

## 2020-08-10 LAB — BASIC METABOLIC PANEL
Anion gap: 9 (ref 5–15)
BUN: 18 mg/dL (ref 8–23)
CO2: 25 mmol/L (ref 22–32)
Calcium: 8.7 mg/dL — ABNORMAL LOW (ref 8.9–10.3)
Chloride: 106 mmol/L (ref 98–111)
Creatinine, Ser: 1.14 mg/dL — ABNORMAL HIGH (ref 0.44–1.00)
GFR, Estimated: 50 mL/min — ABNORMAL LOW (ref >60)
Glucose, Bld: 254 mg/dL — ABNORMAL HIGH (ref 70–99)
Potassium: 4 mmol/L (ref 3.5–5.1)
Sodium: 140 mmol/L (ref 135–145)

## 2020-08-10 LAB — CBC
HCT: 42.7 % (ref 36.0–46.0)
Hemoglobin: 13.1 g/dL (ref 12.0–15.0)
MCH: 25.2 pg — ABNORMAL LOW (ref 26.0–34.0)
MCHC: 30.7 g/dL (ref 30.0–36.0)
MCV: 82.1 fL (ref 80.0–100.0)
Platelets: 223 10*3/uL (ref 150–400)
RBC: 5.2 MIL/uL — ABNORMAL HIGH (ref 3.87–5.11)
RDW: 16.5 % — ABNORMAL HIGH (ref 11.5–15.5)
WBC: 13.4 10*3/uL — ABNORMAL HIGH (ref 4.0–10.5)
nRBC: 0 % (ref 0.0–0.2)

## 2020-08-10 LAB — TROPONIN I (HIGH SENSITIVITY)
Troponin I (High Sensitivity): 9 ng/L (ref <18)
Troponin I (High Sensitivity): 8 ng/L (ref <18)

## 2020-08-10 MED ORDER — IPRATROPIUM-ALBUTEROL 0.5-2.5 (3) MG/3ML IN SOLN
3.0000 mL | Freq: Once | RESPIRATORY_TRACT | Status: AC
  Administered 2020-08-11: 3 mL via RESPIRATORY_TRACT
  Filled 2020-08-10: qty 3

## 2020-08-10 MED ORDER — PREDNISONE 20 MG PO TABS
60.0000 mg | ORAL_TABLET | ORAL | Status: AC
  Administered 2020-08-11: 60 mg via ORAL
  Filled 2020-08-10: qty 3

## 2020-08-10 NOTE — ED Notes (Signed)
First Nurse Note: Pt to ED via [facility] from home for Increased ShOb since Saturday. VSS, CBG 260- hx/o DM

## 2020-08-10 NOTE — ED Notes (Signed)
Right hand IV removed at pt request due to pain at site, IV appears unremarkable, multiple attempts to convince pt to allow IV to remain in place unsuccessful

## 2020-08-10 NOTE — ED Triage Notes (Signed)
Pt arrived via [facility] from home with reports of shortness of breath off and on x 3 weeks, pt states she was here a week ago was given nebs and steroids felt better but then started to have shortness of breath a few days ago.  Pt does get short of breath when speaking in complete sentences. Pt has hx of asthma.

## 2020-08-11 DIAGNOSIS — J441 Chronic obstructive pulmonary disease with (acute) exacerbation: Secondary | ICD-10-CM | POA: Diagnosis not present

## 2020-08-11 LAB — BRAIN NATRIURETIC PEPTIDE: B Natriuretic Peptide: 26.6 pg/mL (ref 0.0–100.0)

## 2020-08-11 LAB — RESPIRATORY PANEL BY RT PCR (FLU A&B, COVID)
Influenza A by PCR: NEGATIVE
Influenza B by PCR: NEGATIVE
SARS Coronavirus 2 by RT PCR: NEGATIVE

## 2020-08-11 MED ORDER — ALBUTEROL SULFATE HFA 108 (90 BASE) MCG/ACT IN AERS: INHALATION_SPRAY | RESPIRATORY_TRACT | 0 refills | Status: DC

## 2020-08-11 MED ORDER — PREDNISONE 20 MG PO TABS: 60.0000 mg | ORAL_TABLET | Freq: Every day | ORAL | 0 refills | Status: DC

## 2020-08-11 NOTE — ED Notes (Signed)
Meal tray and po fluids provided 

## 2020-08-11 NOTE — ED Provider Notes (Signed)
[facility] Emergency Department Provider Note  ____________________________________________   First MD Initiated Contact with Patient 08/10/20 2332     (approximate)  I have reviewed the triage vital signs and the nursing notes.   HISTORY  Chief Complaint Shortness of Breath    HPI Rachel Nielsen is a 69 y.o. female  Who reportedly has a history of asthma and possibly COPD (the medical record list COPD but the patient is unaware of the diagnosis).  She presents for evaluation of worsening shortness of breath and wheezing.  She has been struggling with this issue for a few weeks on and off.  She has had at least 2 courses of antibiotics prescribed both by her primary care doctor and apparently in the emergency department about a week ago.  She does not have a nebulizer at home but has albuterol inhaler which she has been using more often than usual and it helps a little bit in the short-term but then the symptoms get worse again.  She said that she has not seen a pulmonologist before and has been told she has asthma but is unaware of COPD although she is a former smoker.  She denies fever/chills, sore throat, chest pain, nausea, vomiting, and abdominal pain.  She feels like she has a productive cough although she is not always able to get anything up.  She has been trying cough medicine.  She says she is just frustrated because the symptoms will get better for a little bit and then get worse again.  She was tested for COVID-19 a week ago and it was negative but she has not been vaccinated.  She describes her symptoms as severe, worse with exertion, slightly better with albuterol.          Past Medical History:  Diagnosis Date  . Anxiety   . Asthma   . Constipation   . COPD (chronic obstructive pulmonary disease) ([place])   . Depression   . Diabetes ([place])   . GERD (gastroesophageal reflux disease)   . Hypertension   . Sleep apnea     Patient Active Problem  List   Diagnosis Date Noted  . Acute non-recurrent frontal sinusitis 06/17/2020  . Cough 06/17/2020  . Wheezing 06/17/2020  . Furuncle of axilla 03/14/2019  . Uncontrolled type 2 diabetes mellitus with hyperglycemia ([place]) 03/14/2019  . Sepsis ([place]) 11/17/2018  . Hypertension 12/21/2017  . Type 2 diabetes mellitus with hyperglycemia ([place]) 12/21/2017  . GERD (gastroesophageal reflux disease) 12/21/2017  . Constipation 12/21/2017  . Depression 12/21/2017  . Anxiety 12/21/2017    Past Surgical History:  Procedure Laterality Date  . APPENDECTOMY    . CHOLECYSTECTOMY    . COLONOSCOPY WITH PROPOFOL N/A 07/20/2018   Procedure: COLONOSCOPY WITH PROPOFOL;  Surgeon: Toledo, Benay Pike, MD;  Location: [facility] ENDOSCOPY;  Service: Gastroenterology;  Laterality: N/A;  . ESOPHAGOGASTRODUODENOSCOPY N/A 07/20/2018   Procedure: ESOPHAGOGASTRODUODENOSCOPY (EGD);  Surgeon: Toledo, Benay Pike, MD;  Location: [facility] ENDOSCOPY;  Service: Gastroenterology;  Laterality: N/A;  . GASTRECTOMY    . TUBAL LIGATION    . VAGINAL HYSTERECTOMY      Prior to Admission medications   Medication Sig Start Date End Date Taking? Authorizing Provider  Accu-Chek FastClix Lancets MISC Use as directed twice daily diag e11.65 06/17/20   Kendell Bane, NP  ACCU-CHEK GUIDE test strip Use twice a daily 07/29/20   Lavera Guise, MD  albuterol (VENTOLIN HFA) 108 (90 Base) MCG/ACT inhaler Inhale 2-4 puffs by  mouth every 4 hours as needed for wheezing, cough, and/or shortness of breath 08/11/20   Hinda Kehr, MD  ALPRAZolam Duanne Moron) 0.5 MG tablet Take 0.5-1 mg by mouth See admin instructions. Take 1 tablet (0.5mg ) by mouth twice daily and 2 tablets (1mg ) by mouth at bedtime    [provider]  amLODipine (NORVASC) 5 MG tablet Take 1 tablet (5 mg total) by mouth daily. 07/29/20   Ronnell Freshwater, NP  azithromycin (ZITHROMAX) 250 MG tablet z-pack - take as directed for 5 days 06/17/20   Ronnell Freshwater, NP  benzonatate  (TESSALON) 200 MG capsule Take 1 capsule (200 mg total) by mouth 2 (two) times daily as needed for cough. 06/17/20   Ronnell Freshwater, NP  bisoprolol (ZEBETA) 10 MG tablet Take 1 tablet (10 mg total) by mouth daily. 02/27/20   Ronnell Freshwater, NP  buPROPion (WELLBUTRIN XL) 300 MG 24 hr tablet Take 300 mg by mouth daily.     [provider]  busPIRone (BUSPAR) 15 MG tablet Take 15 mg by mouth 2 (two) times daily.  11/08/17   [provider]  Dulaglutide (TRULICITY) 1.5 HG/9.9ME SOPN inject 1.5 mg once weekly for diabetes 12/22/19   Ronnell Freshwater, NP  DULoxetine (CYMBALTA) 60 MG capsule Take 60 mg by mouth daily.  11/08/17   [provider]  famotidine (PEPCID) 20 MG tablet Take 20 mg by mouth 2 (two) times daily.    [provider]  FARXIGA 5 MG TABS tablet Take 5 mg by mouth daily. 07/24/20   Kendell Bane, NP  fluticasone (FLONASE) 50 MCG/ACT nasal spray ONE SPRAY IN EACH NOSTRIL TWICE DAILY. 02/27/20   Scarboro, Audie Clear, NP  fluticasone furoate-vilanterol (BREO ELLIPTA) 200-25 MCG/INH AEPB Inhale 1 puff into the lungs daily. 09/28/18   Ronnell Freshwater, NP  gabapentin (NEURONTIN) 300 MG capsule Take 1 capsule (300 mg total) by mouth 2 (two) times daily. 07/31/20   Ronnell Freshwater, NP  GLOBAL EASE INJECT PEN NEEDLES 32G X 4 MM MISC Use as directed with insulin 05/30/20   Scarboro, Audie Clear, NP  guaiFENesin (MUCINEX) 600 MG 12 hr tablet Take 2 tablets (1,200 mg total) by mouth 2 (two) times daily. 06/17/20   Ronnell Freshwater, NP  Insulin Glargine (BASAGLAR KWIKPEN) 100 UNIT/ML Inject 0.32 mLs (32 Units total) into the skin daily. 05/20/20   Scarboro, Audie Clear, NP  insulin lispro (HUMALOG) 100 UNIT/ML injection Inject 0.12 mLs (12 Units total) into the skin 3 (three) times daily before meals. 01/29/20   Scarboro, Audie Clear, NP  Insulin Pen Needle (BD PEN NEEDLE NANO U/F) 32G X 4 MM MISC by Does not apply route.    [provider]  Insulin Syringe-Needle U-100  (GLOBAL INJECT EASE INSULIN SYR) 31G X 5/16" 1 ML MISC Use as directed 3 times daily with humalog DX E11.65 07/31/20   Lavera Guise, MD  lactulose [facility]) 10 GM/15ML solution Take by mouth 2 (two) times daily.    [provider]  LATUDA 20 MG TABS tablet Take 20 mg by mouth daily.  11/09/17   [provider]  LINZESS 290 MCG CAPS capsule Take 1 capsule (290 mcg total) by mouth daily before breakfast. 07/29/20   Kendell Bane, NP  meloxicam (MOBIC) 15 MG tablet Take 1 tablet (15 mg total) by mouth at bedtime. 07/29/20   Kendell Bane, NP  mupirocin ointment (BACTROBAN) 2 % Apply to affected areas bid for 10  days 05/04/19   Ronnell Freshwater, NP  nitrofurantoin, macrocrystal-monohydrate, (MACROBID) 100 MG capsule Take 1 capsule (100 mg total) by mouth 2 (two) times daily. 11/13/19   Kendell Bane, NP  nystatin (MYCOSTATIN) 100000 UNIT/ML suspension Swish and swallow with 19mls TID to QID for thrush 04/28/19   Boscia, Heather E, NP  Olopatadine HCl 0.2 % SOLN Place 1 drop into both eyes daily.     [provider]  pantoprazole (PROTONIX) 40 MG tablet Take 1 tablet (40 mg total) by mouth daily. 06/27/19   Kendell Bane, NP  predniSONE (DELTASONE) 20 MG tablet Take 3 tablets (60 mg total) by mouth daily. 08/11/20   Hinda Kehr, MD  ranitidine (ZANTAC) 150 MG tablet Take 150 mg by mouth daily as needed for heartburn.     [provider]  rosuvastatin (CRESTOR) 10 MG tablet Take 1 tablet (10 mg total) by mouth daily. 02/23/20   Scarboro, Audie Clear, NP  SAPHRIS 10 MG SUBL Take 10 mg by mouth every evening.  12/10/17   [provider]  TRAVATAN Z 0.004 % SOLN ophthalmic solution Place 1 drop into both eyes at bedtime.     [provider]  Grant Ruts INHUB 250-50 MCG/DOSE AEPB INHALE 1 PUFF TWICE DAILY. RINSE MOUTH AFTER USE. 05/27/20   Kendell Bane, NP    Allergies Ace inhibitors  Family History  Problem Relation Age of Onset  . Hypertension Mother    . Atrial fibrillation Mother   . Heart attack Mother   . Hypertension Father   . Stomach cancer Father   . Diabetes Father   . Liver cancer Father   . Diabetes Sister   . Hypertension Sister     Social History Social History   Tobacco Use  . Smoking status: Former Research scientist (life sciences)  . Smokeless tobacco: Never Used  . Tobacco comment: 4 months ago  Vaping Use  . Vaping Use: Some days  Substance Use Topics  . Alcohol use: Not Currently    Comment: monthly or less   . Drug use: No    Review of Systems Constitutional: No fever/chills Eyes: No visual changes. ENT: No sore throat. Cardiovascular: Denies chest pain. Respiratory: Shortness of breath and wheezing, worse with exertion.  Frequent cough. Gastrointestinal: No abdominal pain.  No nausea, no vomiting.  No diarrhea.  No constipation. Genitourinary: Negative for dysuria. Musculoskeletal: Negative for neck pain.  Negative for back pain. Integumentary: Negative for rash. Neurological: Negative for headaches, focal weakness or numbness.   ____________________________________________   PHYSICAL EXAM:  VITAL SIGNS: ED Triage Vitals  Enc Vitals Group     BP 08/10/20 1803 (!) 151/106     Pulse Rate 08/10/20 1803 92     Resp 08/10/20 1803 (!) 22     Temp 08/10/20 1803 98.1 F (36.7 C)     Temp Source 08/10/20 1803 Oral     SpO2 08/10/20 1803 96 %     Weight 08/10/20 1809 98.9 kg (218 lb)     Height 08/10/20 1809 1.626 m (5\' 4" )     Head Circumference --      Peak Flow --      Pain Score 08/10/20 1805 3     Pain Loc --      Pain Edu? --      Excl. in [place]? --     Constitutional: Alert and oriented.  Eyes: Conjunctivae are normal.  Head: Atraumatic. Nose: No congestion/rhinnorhea. Mouth/Throat: Patient is wearing a mask. Neck:  No stridor.  No meningeal signs.   Cardiovascular: Normal rate, regular rhythm. Good peripheral circulation. Grossly normal heart sounds. Respiratory: Normal respiratory effort, slightly  increased rate.  No accessory muscle usage.  Extensive expiratory wheezing throughout lung fields.  No thick or wet lung sounds. Gastrointestinal: Soft and nontender. No distention.  Musculoskeletal: No lower extremity tenderness nor edema. No gross deformities of extremities. Neurologic:  Normal speech and language. No gross focal neurologic deficits are appreciated.  Skin:  Skin is warm, dry and intact. Psychiatric: Mood and affect are normal. Speech and behavior are normal.  ____________________________________________   LABS (all labs ordered are listed, but only abnormal results are displayed)  Labs Reviewed  BASIC METABOLIC PANEL - Abnormal; Notable for the following components:      Result Value   Glucose, Bld 254 (*)    Creatinine, Ser 1.14 (*)    Calcium 8.7 (*)    GFR, Estimated 50 (*)    All other components within normal limits  CBC - Abnormal; Notable for the following components:   WBC 13.4 (*)    RBC 5.20 (*)    MCH 25.2 (*)    RDW 16.5 (*)    All other components within normal limits  RESPIRATORY PANEL BY RT PCR (FLU A&B, COVID)  BRAIN NATRIURETIC PEPTIDE  TROPONIN I (HIGH SENSITIVITY)  TROPONIN I (HIGH SENSITIVITY)   ____________________________________________  EKG  ED ECG REPORT I, Hinda Kehr, the attending physician, personally viewed and interpreted this ECG.  Date: 08/10/2020 EKG Time: 18: 09 Rate: 93 Rhythm: normal sinus rhythm QRS Axis: normal Intervals: normal ST/T Wave abnormalities: Non-specific ST segment / T-wave changes, but no clear evidence of acute ischemia. Narrative Interpretation: no definitive evidence of acute ischemia; does not meet STEMI criteria.   ____________________________________________  RADIOLOGY I, Hinda Kehr, personally viewed and evaluated these images (plain radiographs) as part of my medical decision making, as well as reviewing the written report by the radiologist.  ED MD interpretation: No acute  abnormality on chest x-ray  Official radiology report(s): DG Chest 2 View  Result Date: 08/10/2020 CLINICAL DATA:  Shortness of breath. EXAM: CHEST - 2 VIEW COMPARISON:  08/03/2020 FINDINGS: The heart size and mediastinal contours are within normal limits. Both lungs are clear. The visualized skeletal structures are unremarkable. IMPRESSION: No active cardiopulmonary disease. Electronically Signed   By: Constance Holster M.D.   On: 08/10/2020 18:48    ____________________________________________   PROCEDURES   Procedure(s) performed (including Critical Care):  Procedures   ____________________________________________   INITIAL IMPRESSION / MDM / [place] / ED COURSE  As part of my medical decision making, I reviewed the following data within the [facility] notes reviewed and incorporated, Labs reviewed , EKG interpreted , Old chart reviewed, Radiograph reviewed  and Notes from prior ED visits   Differential diagnosis includes, but is not limited to, COPD exacerbation, community-acquired pneumonia, viral respiratory infection including COVID-19, new onset CHF, less likely PE.  Vital signs are stable (hypertensive but otherwise unremarkable).  Slightly elevated respiratory rate but this seems to be associated with exertion.  She is able to speak in full sentences but has very noticeable expiratory wheezing consistent with an obstructive lung process such as COPD or asthma.  She is unaware of being diagnosed with COPD but it appears in her medical record and her symptoms are certainly consistent with persistent COPD exacerbation.  Because she is an insulin-dependent diabetic, I believe providers have avoided treating her  with steroids other than an initial dose of Solu-Medrol in the emergency department in the past.  However I believe that she needs a course of steroids, at least a burst for the next 5 days.  I talked to her about increasing the amount  of albuterol she is using at home and talking with her primary care doctor about being approved for a nebulizer.  I also encouraged her to talk with her primary care provider and/or a pulmonologist (I gave her 2 names) about pulmonary function tests and further COPD management to try to avoid persistent exacerbations.  I personally reviewed her chest x-ray and see no sign of infiltrate nor of pulmonary vascular congestion nor pulmonary edema.  Her labs are generally reassuring with a mild leukocytosis but otherwise generally appropriate.  2 high-sensitivity troponins are also appropriate and the patient is not having chest pain.  I treated her with 3 DuoNeb's which helped open her up and reduce the wheezing and she feels better.  She will take the prednisone and knows to expect that her glucose level may be significantly higher over the next week or so.  I strongly encourage close outpatient follow-up both with her PCP and with pulmonology and she understands and agrees with the plan.  I gave my usual and customary return precautions.       Clinical Course as of Aug 11 57  Sun Aug 11, 2020  0045 B Natriuretic Peptide: 26.6 [CF]    Clinical Course User Index [CF] Hinda Kehr, MD     ____________________________________________  FINAL CLINICAL IMPRESSION(S) / ED DIAGNOSES  Final diagnoses:  COPD exacerbation ([place])     MEDICATIONS GIVEN DURING THIS VISIT:  Medications  ipratropium-albuterol (DUONEB) 0.5-2.5 (3) MG/3ML nebulizer solution 3 mL (3 mLs Nebulization Given 08/11/20 0002)  ipratropium-albuterol (DUONEB) 0.5-2.5 (3) MG/3ML nebulizer solution 3 mL (3 mLs Nebulization Given 08/11/20 0003)  ipratropium-albuterol (DUONEB) 0.5-2.5 (3) MG/3ML nebulizer solution 3 mL (3 mLs Nebulization Given 08/11/20 0003)  predniSONE (DELTASONE) tablet 60 mg (60 mg Oral Given 08/11/20 0003)     ED Discharge Orders         Ordered    albuterol (VENTOLIN HFA) 108 (90 Base) MCG/ACT inhaler        Note to Pharmacy: Pharmacy may substitute brand and size for insurance-approved equivalent   08/11/20 0021    predniSONE (DELTASONE) 20 MG tablet  Daily        08/11/20 0021          *Please note:  AKEMI OVERHOLSER was evaluated in Emergency Department on 08/11/2020 for the symptoms described in the history of present illness. She was evaluated in the context of the global COVID-19 pandemic, which necessitated consideration that the patient might be at risk for infection with the SARS-CoV-2 virus that causes COVID-19. Institutional protocols and algorithms that pertain to the evaluation of patients at risk for COVID-19 are in a state of rapid change based on information released by regulatory bodies including the CDC and federal and state organizations. These policies and algorithms were followed during the patient's care in the ED.  Some ED evaluations and interventions may be delayed as a result of limited staffing during and after the pandemic.*  Note:  This document was prepared using Dragon voice recognition software and may include unintentional dictation errors.   Hinda Kehr, MD 08/11/20 [phone]

## 2020-08-11 NOTE — Discharge Instructions (Addendum)
As we discussed, I believe you have a condition called chronic obstructive pulmonary disease (COPD) that presents similarly to asthma and can cause the same kind of symptoms you are experiencing.  Please use your regular medications including your albuterol inhaler.  If you feel short of breath and wheezy, you can use 4 puffs on the albuterol inhaler or even more, and use it as often as every 2 hours.  Please follow-up with your primary care doctor and talk about further management for possible COPD including possible pulmonary function tests (PFTs).  I also provided the name and number for 2 different pulmonologists (lung specialist) who may be able to see you in clinic and help you further.  Please take the prescription for prednisone over the next 5 days as prescribed, but remember that it will make your blood glucose level much higher than usual, so continue using your diabetes medicine as prescribed but do not be surprised if your blood sugar is much higher than usual.    Return to the emergency department if you develop new or worsening symptoms that concern you.

## 2020-08-15 DIAGNOSIS — G4733 Obstructive sleep apnea (adult) (pediatric): Secondary | ICD-10-CM | POA: Diagnosis not present

## 2020-08-15 DIAGNOSIS — J4531 Mild persistent asthma with (acute) exacerbation: Secondary | ICD-10-CM | POA: Diagnosis not present

## 2020-08-15 DIAGNOSIS — R0600 Dyspnea, unspecified: Secondary | ICD-10-CM | POA: Diagnosis not present

## 2020-08-15 DIAGNOSIS — E663 Overweight: Secondary | ICD-10-CM | POA: Diagnosis not present

## 2020-08-26 ENCOUNTER — Other Ambulatory Visit: Payer: Self-pay

## 2020-08-26 MED ORDER — TRULICITY 1.5 MG/0.5ML ~~LOC~~ SOAJ: SUBCUTANEOUS | 0 refills | Status: DC

## 2020-08-27 ENCOUNTER — Other Ambulatory Visit: Payer: Self-pay

## 2020-08-27 DIAGNOSIS — K5900 Constipation, unspecified: Secondary | ICD-10-CM

## 2020-08-27 DIAGNOSIS — I10 Essential (primary) hypertension: Secondary | ICD-10-CM

## 2020-08-27 MED ORDER — FLUTICASONE PROPIONATE 50 MCG/ACT NA SUSP: NASAL | 0 refills | Status: DC

## 2020-08-27 MED ORDER — LINACLOTIDE 290 MCG PO CAPS: ORAL_CAPSULE | ORAL | 0 refills | Status: DC

## 2020-08-27 MED ORDER — DAPAGLIFLOZIN PROPANEDIOL 5 MG PO TABS
5.0000 mg | ORAL_TABLET | Freq: Every day | ORAL | 0 refills | Status: DC
Start: 2020-08-27 — End: 2020-10-08

## 2020-08-27 MED ORDER — BISOPROLOL FUMARATE 10 MG PO TABS: 10.0000 mg | ORAL_TABLET | Freq: Every day | ORAL | 0 refills | Status: DC

## 2020-08-27 MED ORDER — MELOXICAM 15 MG PO TABS: 15.0000 mg | ORAL_TABLET | Freq: Every day | ORAL | 0 refills | Status: DC

## 2020-09-05 ENCOUNTER — Other Ambulatory Visit: Payer: Self-pay

## 2020-09-05 MED ORDER — BD PEN NEEDLE NANO U/F 32G X 4 MM MISC: 0 refills | Status: DC

## 2020-09-16 ENCOUNTER — Other Ambulatory Visit: Payer: Self-pay | Admitting: Internal Medicine

## 2020-09-18 ENCOUNTER — Other Ambulatory Visit: Payer: Self-pay

## 2020-09-21 ENCOUNTER — Other Ambulatory Visit: Payer: Self-pay

## 2020-09-21 ENCOUNTER — Emergency Department: Payer: Medicare HMO

## 2020-09-21 ENCOUNTER — Encounter: Payer: Self-pay | Admitting: Emergency Medicine

## 2020-09-21 ENCOUNTER — Emergency Department
Admission: EM | Admit: 2020-09-21 | Discharge: 2020-09-21 | Disposition: A | Discharge status: Home / Self Care | Payer: Medicare HMO | Attending: Emergency Medicine | Admitting: Emergency Medicine

## 2020-09-21 DIAGNOSIS — Z79899 Other long term (current) drug therapy: Secondary | ICD-10-CM | POA: Insufficient documentation

## 2020-09-21 DIAGNOSIS — I10 Essential (primary) hypertension: Secondary | ICD-10-CM | POA: Diagnosis not present

## 2020-09-21 DIAGNOSIS — M542 Cervicalgia: Secondary | ICD-10-CM | POA: Diagnosis not present

## 2020-09-21 DIAGNOSIS — Z794 Long term (current) use of insulin: Secondary | ICD-10-CM | POA: Insufficient documentation

## 2020-09-21 DIAGNOSIS — E1165 Type 2 diabetes mellitus with hyperglycemia: Secondary | ICD-10-CM | POA: Diagnosis not present

## 2020-09-21 DIAGNOSIS — J45909 Unspecified asthma, uncomplicated: Secondary | ICD-10-CM | POA: Insufficient documentation

## 2020-09-21 DIAGNOSIS — J449 Chronic obstructive pulmonary disease, unspecified: Secondary | ICD-10-CM | POA: Diagnosis not present

## 2020-09-21 DIAGNOSIS — G319 Degenerative disease of nervous system, unspecified: Secondary | ICD-10-CM | POA: Diagnosis not present

## 2020-09-21 DIAGNOSIS — R2689 Other abnormalities of gait and mobility: Secondary | ICD-10-CM | POA: Insufficient documentation

## 2020-09-21 DIAGNOSIS — W01198A Fall on same level from slipping, tripping and stumbling with subsequent striking against other object, initial encounter: Secondary | ICD-10-CM | POA: Diagnosis not present

## 2020-09-21 DIAGNOSIS — Z7951 Long term (current) use of inhaled steroids: Secondary | ICD-10-CM | POA: Insufficient documentation

## 2020-09-21 DIAGNOSIS — R6884 Jaw pain: Secondary | ICD-10-CM | POA: Insufficient documentation

## 2020-09-21 DIAGNOSIS — Y92002 Bathroom of unspecified non-institutional (private) residence single-family (private) house as the place of occurrence of the external cause: Secondary | ICD-10-CM | POA: Insufficient documentation

## 2020-09-21 DIAGNOSIS — W19XXXA Unspecified fall, initial encounter: Secondary | ICD-10-CM

## 2020-09-21 DIAGNOSIS — Z87891 Personal history of nicotine dependence: Secondary | ICD-10-CM | POA: Insufficient documentation

## 2020-09-21 DIAGNOSIS — M47812 Spondylosis without myelopathy or radiculopathy, cervical region: Secondary | ICD-10-CM | POA: Diagnosis not present

## 2020-09-21 DIAGNOSIS — I6782 Cerebral ischemia: Secondary | ICD-10-CM | POA: Diagnosis not present

## 2020-09-21 DIAGNOSIS — M25521 Pain in right elbow: Secondary | ICD-10-CM | POA: Diagnosis not present

## 2020-09-21 LAB — BASIC METABOLIC PANEL
Anion gap: 11 (ref 5–15)
BUN: 17 mg/dL (ref 8–23)
CO2: 24 mmol/L (ref 22–32)
Calcium: 8.7 mg/dL — ABNORMAL LOW (ref 8.9–10.3)
Chloride: 104 mmol/L (ref 98–111)
Creatinine, Ser: 1.09 mg/dL — ABNORMAL HIGH (ref 0.44–1.00)
GFR, Estimated: 56 mL/min — ABNORMAL LOW (ref >60)
Glucose, Bld: 143 mg/dL — ABNORMAL HIGH (ref 70–99)
Potassium: 3.6 mmol/L (ref 3.5–5.1)
Sodium: 139 mmol/L (ref 135–145)

## 2020-09-21 LAB — URINALYSIS, COMPLETE (UACMP) WITH MICROSCOPIC
Bilirubin Urine: NEGATIVE
Glucose, UA: >=500 mg/dL — AB
Ketones, ur: NEGATIVE mg/dL
Nitrite: POSITIVE — AB
Protein, ur: NEGATIVE mg/dL
Specific Gravity, Urine: 1.015 (ref 1.005–1.030)
pH: 5 (ref 5.0–8.0)

## 2020-09-21 LAB — CBC WITH DIFFERENTIAL/PLATELET
Abs Immature Granulocytes: 0.14 10*3/uL — ABNORMAL HIGH (ref 0.00–0.07)
Basophils Absolute: 0.1 10*3/uL (ref 0.0–0.1)
Basophils Relative: 1 %
Eosinophils Absolute: 0.4 10*3/uL (ref 0.0–0.5)
Eosinophils Relative: 3 %
HCT: 41.1 % (ref 36.0–46.0)
Hemoglobin: 12.5 g/dL (ref 12.0–15.0)
Immature Granulocytes: 1 %
Lymphocytes Relative: 26 %
Lymphs Abs: 3.3 10*3/uL (ref 0.7–4.0)
MCH: 25.3 pg — ABNORMAL LOW (ref 26.0–34.0)
MCHC: 30.4 g/dL (ref 30.0–36.0)
MCV: 83.2 fL (ref 80.0–100.0)
Monocytes Absolute: 0.9 10*3/uL (ref 0.1–1.0)
Monocytes Relative: 7 %
Neutro Abs: 8 10*3/uL — ABNORMAL HIGH (ref 1.7–7.7)
Neutrophils Relative %: 62 %
Platelets: 225 10*3/uL (ref 150–400)
RBC: 4.94 MIL/uL (ref 3.87–5.11)
RDW: 17.1 % — ABNORMAL HIGH (ref 11.5–15.5)
WBC: 12.8 10*3/uL — ABNORMAL HIGH (ref 4.0–10.5)
nRBC: 0 % (ref 0.0–0.2)

## 2020-09-21 LAB — CBG MONITORING, ED: Glucose-Capillary: 152 mg/dL — ABNORMAL HIGH (ref 70–99)

## 2020-09-21 NOTE — ED Triage Notes (Signed)
Pt to ED via POV stating that she is having pain in her neck, pain, and arm since August. Pt reports falling in July but not having pain at that time. Pt states that she has had generalized pain in these areas since August and that it seems to be getting worse with the cold weather. Pt states that she is in the process of changing to a new PCP and they have not been able to see her yet. Pt states that RN from [employer] told her to she needed to come to the ED because she was worried about her. Pt also report that her CBG was 333 yesterday. Pt thinks her CBG was 265 today. Pt is in NAD.

## 2020-09-21 NOTE — ED Notes (Signed)
Pt has multiple complaints, see triage note. Pt states she was admitted to hospital in feb. 2020 and wants to know why she passed out and now has ongoing weakness, states nobody told her what was wrong with her and home health nurse sent her to ER to find out. Pt also c/o right arm pain from fall in June, and irregular blood sugars. Pt states that she is between providers and will see a PCP/endocrinologist in Dec. but wants to know about the about the above issues.

## 2020-09-21 NOTE — Discharge Instructions (Addendum)
Your exam, labs, and CT scans are normal at this time. You should follow-up with your primary provider for continued symptoms. Consider an evaluation of Neurology and Endocrinology for specialty care.

## 2020-09-21 NOTE — ED Provider Notes (Signed)
[facility] Emergency Department Provider Note ____________________________________________  Time seen: 1602  I have reviewed the triage vital signs and the nursing notes.  HISTORY  Chief Complaint  Generalized Pain and Hyperglycemia  HPI Rachel Nielsen is a 69 y.o. female presents herself to the ED for evaluation of intermittent complaints of jaw and neck pain following a mechanical fall in July.  Patient who has a history of an undiagnosed balance disorder,  COPD, bipolar disorder, type 2 diabetes, and hypertension, presents at the urging of her insurance provider RN for evaluation of injury sustained following a fall in July.  Patient reports she slipped and fell in her bathroom, hitting the side of her head and neck on the tub edge.  She did not seek care for her injuries at that time.  She reports she has been leery about reporting to the ED due to the protracted weight she had the last time she presented.  She was evaluated here last month for COPD exacerbation and discharged home to follow with primary provider.  She also reporting that she is in the process of transitioning her care from one primary provider to another.  She denies any recent falls, head injury, loss of consciousness, nausea, vomiting, or dizziness.  She does report some elevated blood sugars noting a fingerstick at 333 mg/dl yesterday, and 250 mg/dl today.  She ambulates at home with a four-point cane, but does note that she has a walker however, she notes that the walker is too large to pass through her door frames.  Patient reports she was never followed up by her primary provider after her admission last year.  She would report that all of her medical problems were exacerbated following that admission.  Chart review reveals that the patient did have an admission in January 2020, for AKI, non-STEMI, pneumonia, dehydration.  Past Medical History:  Diagnosis Date  . Anxiety   . Asthma   . Constipation    . COPD (chronic obstructive pulmonary disease) ([place])   . Depression   . Diabetes ([place])   . GERD (gastroesophageal reflux disease)   . Hypertension   . Sleep apnea     Patient Active Problem List   Diagnosis Date Noted  . Acute non-recurrent frontal sinusitis 06/17/2020  . Cough 06/17/2020  . Wheezing 06/17/2020  . Furuncle of axilla 03/14/2019  . Uncontrolled type 2 diabetes mellitus with hyperglycemia ([place]) 03/14/2019  . Sepsis ([place]) 11/17/2018  . Hypertension 12/21/2017  . Type 2 diabetes mellitus with hyperglycemia ([place]) 12/21/2017  . GERD (gastroesophageal reflux disease) 12/21/2017  . Constipation 12/21/2017  . Depression 12/21/2017  . Anxiety 12/21/2017    Past Surgical History:  Procedure Laterality Date  . APPENDECTOMY    . CHOLECYSTECTOMY    . COLONOSCOPY WITH PROPOFOL N/A 07/20/2018   Procedure: COLONOSCOPY WITH PROPOFOL;  Surgeon: Toledo, Benay Pike, MD;  Location: [facility] ENDOSCOPY;  Service: Gastroenterology;  Laterality: N/A;  . ESOPHAGOGASTRODUODENOSCOPY N/A 07/20/2018   Procedure: ESOPHAGOGASTRODUODENOSCOPY (EGD);  Surgeon: Toledo, Benay Pike, MD;  Location: [facility] ENDOSCOPY;  Service: Gastroenterology;  Laterality: N/A;  . GASTRECTOMY    . TUBAL LIGATION    . VAGINAL HYSTERECTOMY      Prior to Admission medications   Medication Sig Start Date End Date Taking? Authorizing Provider  Accu-Chek FastClix Lancets MISC Use as directed twice daily diag e11.65 06/17/20   Kendell Bane, NP  ACCU-CHEK GUIDE test strip Use twice a daily 09/16/20   Lavera Guise, MD  albuterol (VENTOLIN HFA) 108 (90 Base) MCG/ACT inhaler Inhale 2-4 puffs by mouth every 4 hours as needed for wheezing, cough, and/or shortness of breath 08/11/20   Hinda Kehr, MD  ALPRAZolam Duanne Moron) 0.5 MG tablet Take 0.5-1 mg by mouth See admin instructions. Take 1 tablet (0.5mg ) by mouth twice daily and 2 tablets (1mg ) by mouth at bedtime    [provider]  amLODipine (NORVASC) 5 MG tablet Take 1  tablet (5 mg total) by mouth daily. 07/29/20   Ronnell Freshwater, NP  benzonatate (TESSALON) 200 MG capsule Take 1 capsule (200 mg total) by mouth 2 (two) times daily as needed for cough. 06/17/20   Ronnell Freshwater, NP  bisoprolol (ZEBETA) 10 MG tablet Take 1 tablet (10 mg total) by mouth daily. 08/27/20   Ronnell Freshwater, NP  buPROPion (WELLBUTRIN XL) 300 MG 24 hr tablet Take 300 mg by mouth daily.     [provider]  busPIRone (BUSPAR) 15 MG tablet Take 15 mg by mouth 2 (two) times daily.  11/08/17   [provider]  dapagliflozin propanediol (FARXIGA) 5 MG TABS tablet Take 1 tablet (5 mg total) by mouth daily. 08/27/20   Ronnell Freshwater, NP  Dulaglutide (TRULICITY) 1.5 UE/4.5WU SOPN inject 1.5 mg once weekly for diabetes 08/26/20   Lavera Guise, MD  DULoxetine (CYMBALTA) 60 MG capsule Take 60 mg by mouth daily.  11/08/17   [provider]  famotidine (PEPCID) 20 MG tablet Take 20 mg by mouth 2 (two) times daily.    [provider]  fluticasone (FLONASE) 50 MCG/ACT nasal spray ONE SPRAY IN EACH NOSTRIL TWICE DAILY. 08/27/20   Ronnell Freshwater, NP  fluticasone furoate-vilanterol (BREO ELLIPTA) 200-25 MCG/INH AEPB Inhale 1 puff into the lungs daily. 09/28/18   Ronnell Freshwater, NP  gabapentin (NEURONTIN) 300 MG capsule Take 1 capsule (300 mg total) by mouth 2 (two) times daily. 07/31/20   Ronnell Freshwater, NP  GLOBAL EASE INJECT PEN NEEDLES 32G X 4 MM MISC Use as directed with insulin 05/30/20   Scarboro, Audie Clear, NP  guaiFENesin (MUCINEX) 600 MG 12 hr tablet Take 2 tablets (1,200 mg total) by mouth 2 (two) times daily. 06/17/20   Ronnell Freshwater, NP  Insulin Glargine (BASAGLAR KWIKPEN) 100 UNIT/ML Inject 0.32 mLs (32 Units total) into the skin daily. 05/20/20   Scarboro, Audie Clear, NP  insulin lispro (HUMALOG) 100 UNIT/ML injection Inject 0.12 mLs (12 Units total) into the skin 3 (three) times daily before meals. 01/29/20   Kendell Bane, NP  Insulin Pen Needle  (BD PEN NEEDLE NANO U/F) 32G X 4 MM MISC PT changed PCP so no longer our PT 09/05/20   Lavera Guise, MD  Insulin Syringe-Needle U-100 (GLOBAL INJECT EASE INSULIN SYR) 31G X 5/16" 1 ML MISC Use as directed 3 times daily with humalog DX E11.65 07/31/20   Lavera Guise, MD  lactulose [facility]) 10 GM/15ML solution Take by mouth 2 (two) times daily.    [provider]  LATUDA 20 MG TABS tablet Take 20 mg by mouth daily.  11/09/17   [provider]  linaclotide Rolan Lipa) 290 MCG CAPS capsule Take 1 capsule (290 mcg total) by mouth daily before breakfast. 08/27/20   Ronnell Freshwater, NP  meloxicam (MOBIC) 15 MG tablet Take 1 tablet (15 mg total) by mouth at bedtime. 08/27/20   Ronnell Freshwater, NP  mupirocin ointment (BACTROBAN) 2 % Apply to affected areas bid for  10 days 05/04/19   Ronnell Freshwater, NP  nitrofurantoin, macrocrystal-monohydrate, (MACROBID) 100 MG capsule Take 1 capsule (100 mg total) by mouth 2 (two) times daily. 11/13/19   Kendell Bane, NP  nystatin (MYCOSTATIN) 100000 UNIT/ML suspension Swish and swallow with 15mls TID to QID for thrush 04/28/19   Boscia, Heather E, NP  Olopatadine HCl 0.2 % SOLN Place 1 drop into both eyes daily.     [provider]  pantoprazole (PROTONIX) 40 MG tablet Take 1 tablet (40 mg total) by mouth daily. 06/27/19   Kendell Bane, NP  predniSONE (DELTASONE) 20 MG tablet Take 3 tablets (60 mg total) by mouth daily. 08/11/20   Hinda Kehr, MD  ranitidine (ZANTAC) 150 MG tablet Take 150 mg by mouth daily as needed for heartburn.     [provider]  rosuvastatin (CRESTOR) 10 MG tablet Take 1 tablet (10 mg total) by mouth daily. 02/23/20   Scarboro, Audie Clear, NP  SAPHRIS 10 MG SUBL Take 10 mg by mouth every evening.  12/10/17   [provider]  TRAVATAN Z 0.004 % SOLN ophthalmic solution Place 1 drop into both eyes at bedtime.     [provider]  Grant Ruts INHUB 250-50 MCG/DOSE AEPB INHALE 1 PUFF TWICE DAILY. RINSE  MOUTH AFTER USE. 05/27/20   Kendell Bane, NP    Allergies Ace inhibitors  Family History  Problem Relation Age of Onset  . Hypertension Mother   . Atrial fibrillation Mother   . Heart attack Mother   . Hypertension Father   . Stomach cancer Father   . Diabetes Father   . Liver cancer Father   . Diabetes Sister   . Hypertension Sister     Social History Social History   Tobacco Use  . Smoking status: Former Research scientist (life sciences)  . Smokeless tobacco: Never Used  . Tobacco comment: 4 months ago  Vaping Use  . Vaping Use: Some days  Substance Use Topics  . Alcohol use: Not Currently    Comment: monthly or less   . Drug use: No    Review of Systems  Constitutional: Negative for fever. Eyes: Negative for visual changes. ENT: Negative for sore throat. Reports bilateral jaw/cheek pain Cardiovascular: Negative for chest pain. Respiratory: Negative for shortness of breath. Gastrointestinal: Negative for abdominal pain, vomiting and diarrhea. Genitourinary: Negative for dysuria. Musculoskeletal: Negative for back pain. Right elbow pain, neck pain Skin: Negative for rash. Neurological: Negative for headaches, focal weakness or numbness. ____________________________________________  PHYSICAL EXAM:  VITAL SIGNS: ED Triage Vitals  Enc Vitals Group     BP 09/21/20 1502 (!) 145/86     Pulse Rate 09/21/20 1459 87     Resp 09/21/20 1459 16     Temp 09/21/20 1502 99.5 F (37.5 C)     Temp Source 09/21/20 1500 Oral     SpO2 09/21/20 1459 97 %     Weight 09/21/20 1458 218 lb (98.9 kg)     Height 09/21/20 1458 5\' 4"  (1.626 m)     Head Circumference --      Peak Flow --      Pain Score 09/21/20 1458 5     Pain Loc --      Pain Edu? --      Excl. in [place]? --     Constitutional: Alert and oriented. Well appearing and in no distress. Head: Normocephalic and atraumatic. Eyes: Conjunctivae are normal. PERRL. Normal extraocular movements Ears: Canals clear. TMs intact  bilaterally.  Nose: No congestion/rhinorrhea/epistaxis. Mouth/Throat: Mucous membranes are moist. Neck: Supple. No thyromegaly. Hematological/Lymphatic/Immunological: No cervical lymphadenopathy. Cardiovascular: Normal rate, regular rhythm. Normal distal pulses. Respiratory: Normal respiratory effort. No wheezes/rales/rhonchi. Gastrointestinal: Soft and nontender. No distention. Musculoskeletal: Nontender with normal range of motion in all extremities.  Neurologic:  Normal gait without ataxia. Normal speech and language. No gross focal neurologic deficits are appreciated. Skin:  Skin is warm, dry and intact. No rash noted. Psychiatric: Mood and affect are normal. Patient exhibits appropriate insight and judgment. ____________________________________________   LABS (pertinent positives/negatives) Labs Reviewed  BASIC METABOLIC PANEL - Abnormal; Notable for the following components:      Result Value   Glucose, Bld 143 (*)    Creatinine, Ser 1.09 (*)    Calcium 8.7 (*)    GFR, Estimated 56 (*)    All other components within normal limits  CBC WITH DIFFERENTIAL/PLATELET - Abnormal; Notable for the following components:   WBC 12.8 (*)    MCH 25.3 (*)    RDW 17.1 (*)    Neutro Abs 8.0 (*)    Abs Immature Granulocytes 0.14 (*)    All other components within normal limits  URINALYSIS, COMPLETE (UACMP) WITH MICROSCOPIC - Abnormal; Notable for the following components:   Color, Urine YELLOW (*)    APPearance CLEAR (*)    Glucose, UA >=500 (*)    Hgb urine dipstick SMALL (*)    Nitrite POSITIVE (*)    Leukocytes,Ua TRACE (*)    Bacteria, UA RARE (*)    All other components within normal limits  CBG MONITORING, ED - Abnormal; Notable for the following components:   Glucose-Capillary 152 (*)    All other components within normal limits  URINE CULTURE  ____________________________________________   RADIOLOGY  CT Head / Cervical Spine w/o CM  IMPRESSION: 1. No CT evidence for acute  intracranial abnormality. Atrophy and mild small vessel ischemic changes of the white matter. 2. Reversal of cervical lordosis with degenerative changes. No acute osseous abnormality.  DG Right Elbow  IMPRESSION: Negative. ____________________________________________  PROCEDURES  Standard walker  Procedures ____________________________________________  INITIAL IMPRESSION / ASSESSMENT AND PLAN / ED COURSE  DDX: concussion, SAH/SDH, CVA/TIA, spinal injury  Geriatric patient who presents to the ED for evaluation of ongoing symptoms following mechanical fall from July of this year.  This is the patient's initial evaluation for those complaints which include jaw pain and neck pain. Her exam, scans, labs and XRs are all normal and reassuring. Her UTI was nitrite-positive, so a culture is pending. Her jaw symptoms may be related to dry mouth and parotid gland spasms. She has no evidence of any acute findings related to her chronic issues with balance. She is referred to her PCP for further referral to endocrinology and neurology.   Rachel Nielsen was evaluated in Emergency Department on 09/21/2020 for the symptoms described in the history of present illness. She was evaluated in the context of the global COVID-19 pandemic, which necessitated consideration that the patient might be at risk for infection with the SARS-CoV-2 virus that causes COVID-19. Institutional protocols and algorithms that pertain to the evaluation of patients at risk for COVID-19 are in a state of rapid change based on information released by regulatory bodies including the CDC and federal and state organizations. These policies and algorithms were followed during the patient's care in the ED. ___________________________________________  FINAL CLINICAL IMPRESSION(S) / ED DIAGNOSES  Final diagnoses:  Fall at home, initial encounter  Balance problem  Melvenia Needles, PA-C 09/21/20 2251    Harvest Dark, MD 09/22/20 2126

## 2020-09-24 DIAGNOSIS — R0602 Shortness of breath: Secondary | ICD-10-CM | POA: Diagnosis not present

## 2020-09-24 DIAGNOSIS — J989 Respiratory disorder, unspecified: Secondary | ICD-10-CM | POA: Diagnosis not present

## 2020-09-24 DIAGNOSIS — Z01818 Encounter for other preprocedural examination: Secondary | ICD-10-CM | POA: Diagnosis not present

## 2020-09-24 DIAGNOSIS — E663 Overweight: Secondary | ICD-10-CM | POA: Diagnosis not present

## 2020-09-24 LAB — URINE CULTURE
Culture: >=100000 — AB
Special Requests: NORMAL

## 2020-09-25 NOTE — Progress Notes (Signed)
ED Antimicrobial Stewardship Positive Culture Follow Up   Rachel Nielsen is an 69 y.o. female who presented to [facility] on 09/21/2020 with a chief complaint of generalized pain and hyperglycemia. Patient fell in bathroom and hit the side of her head and neck on the tub edge.   Per ED provider, if no urinary symptoms no indication for antibiotics, however if symptoms then give Keflex. Called patient 11/24 - pt states she does have increased urgency, frequency, and slight pain/discomfort. Informed patient antibiotics would be called in to her pharmacy.    Chief Complaint  Patient presents with  . Generalized Pain  . Hyperglycemia    Recent Results (from the past 720 hour(s))  Urine culture     Status: Abnormal   Collection Time: 09/21/20  6:41 PM   Specimen: Urine, Clean Catch  Result Value Ref Range Status   Specimen Description   Final    URINE, CLEAN CATCH Performed at [facility], [address], [place], [place] [address]    Special Requests   Final    Normal Performed at [facility], [place],  [address]    Culture >=100,000 COLONIES/mL ESCHERICHIA COLI (A)  Final   Report Status 09/24/2020 FINAL  Final   Organism ID, Bacteria ESCHERICHIA COLI (A)  Final      Susceptibility   Escherichia coli - MIC*    AMPICILLIN 4 SENSITIVE Sensitive     CEFAZOLIN <=4 SENSITIVE Sensitive     CEFEPIME <=0.12 SENSITIVE Sensitive     CEFTRIAXONE <=0.25 SENSITIVE Sensitive     CIPROFLOXACIN <=0.25 SENSITIVE Sensitive     GENTAMICIN <=1 SENSITIVE Sensitive     IMIPENEM <=0.25 SENSITIVE Sensitive     NITROFURANTOIN <=16 SENSITIVE Sensitive     TRIMETH/SULFA <=20 SENSITIVE Sensitive     AMPICILLIN/SULBACTAM <=2 SENSITIVE Sensitive     PIP/TAZO <=4 SENSITIVE Sensitive     * >=100,000 COLONIES/mL ESCHERICHIA COLI    [x]  Patient discharged originally without antimicrobial agent and treatment is now indicated  New antibiotic prescription:  Cephalexin 500mg  BID x 5 days  ED Provider: Dr. Rhetta Mura, PharmD Pharmacy Resident  09/25/2020 9:16 AM

## 2020-10-07 ENCOUNTER — Other Ambulatory Visit: Payer: Self-pay | Admitting: Adult Health

## 2020-10-07 ENCOUNTER — Other Ambulatory Visit: Payer: Self-pay | Admitting: Internal Medicine

## 2020-10-07 DIAGNOSIS — E1165 Type 2 diabetes mellitus with hyperglycemia: Secondary | ICD-10-CM

## 2020-10-08 ENCOUNTER — Encounter: Payer: Self-pay | Admitting: Intensive Care

## 2020-10-08 ENCOUNTER — Emergency Department: Payer: Medicare HMO

## 2020-10-08 ENCOUNTER — Ambulatory Visit: Payer: Medicare HMO | Admitting: Family Medicine

## 2020-10-08 ENCOUNTER — Emergency Department
Admission: EM | Admit: 2020-10-08 | Discharge: 2020-10-08 | Disposition: A | Discharge status: Home / Self Care | Payer: Medicare HMO | Attending: Emergency Medicine | Admitting: Emergency Medicine

## 2020-10-08 ENCOUNTER — Other Ambulatory Visit: Payer: Self-pay

## 2020-10-08 DIAGNOSIS — Z7952 Long term (current) use of systemic steroids: Secondary | ICD-10-CM | POA: Insufficient documentation

## 2020-10-08 DIAGNOSIS — J45909 Unspecified asthma, uncomplicated: Secondary | ICD-10-CM | POA: Diagnosis not present

## 2020-10-08 DIAGNOSIS — Z79899 Other long term (current) drug therapy: Secondary | ICD-10-CM | POA: Diagnosis not present

## 2020-10-08 DIAGNOSIS — E1165 Type 2 diabetes mellitus with hyperglycemia: Secondary | ICD-10-CM | POA: Insufficient documentation

## 2020-10-08 DIAGNOSIS — I10 Essential (primary) hypertension: Secondary | ICD-10-CM | POA: Insufficient documentation

## 2020-10-08 DIAGNOSIS — J441 Chronic obstructive pulmonary disease with (acute) exacerbation: Secondary | ICD-10-CM | POA: Diagnosis not present

## 2020-10-08 DIAGNOSIS — R918 Other nonspecific abnormal finding of lung field: Secondary | ICD-10-CM | POA: Diagnosis not present

## 2020-10-08 DIAGNOSIS — Z794 Long term (current) use of insulin: Secondary | ICD-10-CM | POA: Insufficient documentation

## 2020-10-08 DIAGNOSIS — K5900 Constipation, unspecified: Secondary | ICD-10-CM

## 2020-10-08 DIAGNOSIS — E119 Type 2 diabetes mellitus without complications: Secondary | ICD-10-CM

## 2020-10-08 DIAGNOSIS — Z87891 Personal history of nicotine dependence: Secondary | ICD-10-CM | POA: Insufficient documentation

## 2020-10-08 DIAGNOSIS — R0602 Shortness of breath: Secondary | ICD-10-CM | POA: Diagnosis present

## 2020-10-08 DIAGNOSIS — J9811 Atelectasis: Secondary | ICD-10-CM | POA: Diagnosis not present

## 2020-10-08 LAB — TROPONIN I (HIGH SENSITIVITY)
Troponin I (High Sensitivity): 14 ng/L (ref <18)
Troponin I (High Sensitivity): 14 ng/L (ref <18)

## 2020-10-08 LAB — BASIC METABOLIC PANEL
Anion gap: 7 (ref 5–15)
BUN: 22 mg/dL (ref 8–23)
CO2: 27 mmol/L (ref 22–32)
Calcium: 9.5 mg/dL (ref 8.9–10.3)
Chloride: 103 mmol/L (ref 98–111)
Creatinine, Ser: 1.06 mg/dL — ABNORMAL HIGH (ref 0.44–1.00)
GFR, Estimated: 58 mL/min — ABNORMAL LOW (ref >60)
Glucose, Bld: 295 mg/dL — ABNORMAL HIGH (ref 70–99)
Potassium: 4.2 mmol/L (ref 3.5–5.1)
Sodium: 137 mmol/L (ref 135–145)

## 2020-10-08 LAB — CBC
HCT: 41.2 % (ref 36.0–46.0)
Hemoglobin: 12.5 g/dL (ref 12.0–15.0)
MCH: 25.6 pg — ABNORMAL LOW (ref 26.0–34.0)
MCHC: 30.3 g/dL (ref 30.0–36.0)
MCV: 84.3 fL (ref 80.0–100.0)
Platelets: 178 10*3/uL (ref 150–400)
RBC: 4.89 MIL/uL (ref 3.87–5.11)
RDW: 16.8 % — ABNORMAL HIGH (ref 11.5–15.5)
WBC: 11.7 10*3/uL — ABNORMAL HIGH (ref 4.0–10.5)
nRBC: 0 % (ref 0.0–0.2)

## 2020-10-08 MED ORDER — PREDNISONE 20 MG PO TABS
40.0000 mg | ORAL_TABLET | ORAL | Status: AC
  Administered 2020-10-08: 40 mg via ORAL
  Filled 2020-10-08: qty 2

## 2020-10-08 MED ORDER — IPRATROPIUM-ALBUTEROL 0.5-2.5 (3) MG/3ML IN SOLN
RESPIRATORY_TRACT | Status: AC
  Administered 2020-10-08: 3 mL via RESPIRATORY_TRACT
  Filled 2020-10-08: qty 3

## 2020-10-08 MED ORDER — DOXYCYCLINE HYCLATE 100 MG PO CAPS: 100.0000 mg | ORAL_CAPSULE | Freq: Two times a day (BID) | ORAL | 0 refills | Status: AC

## 2020-10-08 MED ORDER — BASAGLAR KWIKPEN 100 UNIT/ML ~~LOC~~ SOPN: 32.0000 [IU] | PEN_INJECTOR | Freq: Every day | SUBCUTANEOUS | 0 refills | Status: DC

## 2020-10-08 MED ORDER — IPRATROPIUM-ALBUTEROL 0.5-2.5 (3) MG/3ML IN SOLN
3.0000 mL | Freq: Once | RESPIRATORY_TRACT | Status: AC
  Administered 2020-10-08: 3 mL via RESPIRATORY_TRACT
  Filled 2020-10-08: qty 3

## 2020-10-08 MED ORDER — PREDNISONE 20 MG PO TABS: 40.0000 mg | ORAL_TABLET | Freq: Every day | ORAL | 0 refills | Status: DC

## 2020-10-08 MED ORDER — FLUTICASONE-SALMETEROL 250-50 MCG/DOSE IN AEPB
INHALATION_SPRAY | RESPIRATORY_TRACT | 1 refills | Status: DC
Start: 2020-10-08 — End: 2021-07-09

## 2020-10-08 MED ORDER — DOXYCYCLINE HYCLATE 100 MG PO TABS
100.0000 mg | ORAL_TABLET | Freq: Once | ORAL | Status: AC
  Administered 2020-10-08: 100 mg via ORAL
  Filled 2020-10-08: qty 1

## 2020-10-08 MED ORDER — ALBUTEROL SULFATE (2.5 MG/3ML) 0.083% IN NEBU
5.0000 mg | INHALATION_SOLUTION | Freq: Once | RESPIRATORY_TRACT | Status: AC
  Administered 2020-10-08: 5 mg via RESPIRATORY_TRACT
  Filled 2020-10-08: qty 6

## 2020-10-08 MED ORDER — LINACLOTIDE 290 MCG PO CAPS: ORAL_CAPSULE | ORAL | 0 refills | Status: DC

## 2020-10-08 MED ORDER — MELOXICAM 15 MG PO TABS
15.0000 mg | ORAL_TABLET | Freq: Every day | ORAL | 0 refills | Status: DC
Start: 2020-10-08 — End: 2020-11-05

## 2020-10-08 MED ORDER — BD PEN NEEDLE NANO U/F 32G X 4 MM MISC
0 refills | Status: DC
Start: 2020-10-08 — End: 2021-05-19

## 2020-10-08 MED ORDER — OLOPATADINE HCL 0.2 % OP SOLN
1.0000 [drp] | Freq: Every day | OPHTHALMIC | 0 refills | Status: DC
Start: 2020-10-08 — End: 2020-11-07

## 2020-10-08 MED ORDER — TRULICITY 1.5 MG/0.5ML ~~LOC~~ SOAJ
SUBCUTANEOUS | 0 refills | Status: DC
Start: 2020-10-08 — End: 2020-11-07

## 2020-10-08 MED ORDER — MONTELUKAST SODIUM 10 MG PO TABS
10.0000 mg | ORAL_TABLET | Freq: Every day | ORAL | 0 refills | Status: DC
Start: 2020-10-08 — End: 2024-05-08

## 2020-10-08 MED ORDER — BISOPROLOL FUMARATE 10 MG PO TABS: 10.0000 mg | ORAL_TABLET | Freq: Every day | ORAL | 0 refills | Status: DC

## 2020-10-08 MED ORDER — TRAVATAN Z 0.004 % OP SOLN: 1.0000 [drp] | Freq: Every day | OPHTHALMIC | 0 refills | Status: AC

## 2020-10-08 MED ORDER — DAPAGLIFLOZIN PROPANEDIOL 5 MG PO TABS
5.0000 mg | ORAL_TABLET | Freq: Every day | ORAL | 0 refills | Status: DC
Start: 2020-10-08 — End: 2020-11-05

## 2020-10-08 MED ORDER — ALBUTEROL SULFATE HFA 108 (90 BASE) MCG/ACT IN AERS: INHALATION_SPRAY | RESPIRATORY_TRACT | 0 refills | Status: DC

## 2020-10-08 MED ORDER — IPRATROPIUM-ALBUTEROL 0.5-2.5 (3) MG/3ML IN SOLN: 3.0000 mL | Freq: Once | RESPIRATORY_TRACT | Status: AC

## 2020-10-08 MED ORDER — FLUTICASONE PROPIONATE 50 MCG/ACT NA SUSP
NASAL | 0 refills | Status: DC
Start: 2020-10-08 — End: 2020-11-05

## 2020-10-08 MED ORDER — INSULIN SYRINGE-NEEDLE U-100 31G X 5/16" 1 ML MISC
0 refills | Status: DC
Start: 2020-10-08 — End: 2020-11-11

## 2020-10-08 MED ORDER — BUPROPION HCL ER (XL) 300 MG PO TB24: 300.0000 mg | ORAL_TABLET | Freq: Every day | ORAL | 0 refills | Status: AC

## 2020-10-08 MED ORDER — ROSUVASTATIN CALCIUM 10 MG PO TABS: 10.0000 mg | ORAL_TABLET | Freq: Every day | ORAL | 1 refills | Status: DC

## 2020-10-08 MED ORDER — INSULIN LISPRO 100 UNIT/ML ~~LOC~~ SOLN: 12.0000 [IU] | Freq: Three times a day (TID) | SUBCUTANEOUS | 11 refills | Status: DC

## 2020-10-08 NOTE — ED Triage Notes (Signed)
Patient c/o sob since last week. Trouble speaking in complete sentences and reports it is bc she is sob. Tightness/uncomfortable in chest. Reports she needs another prescription for inhaler. Also reports she needs medication refills and the PCP would not see her due to SOB after ambulating in office.

## 2020-10-08 NOTE — ED Provider Notes (Signed)
[facility] Emergency Department Provider Note  ____________________________________________  Time seen: Approximately 10:29 PM  I have reviewed the triage vital signs and the nursing notes.   HISTORY  Chief Complaint Shortness of Breath and Chest Pain    HPI Rachel Nielsen is a 69 y.o. female with a history of COPD depression diabetes GERD hypertension who comes the ED complaining of worsening shortness of breath for the past week. Very gradual onset, constant, no aggravating or alleviating factors. Associated with some mild tightness in the chest which is not pleuritic, nonradiating, not exertional, no aggravating or alleviating factors. No vomiting or diaphoresis.  She notes that she has been out of her daily inhaled corticosteroid and Singulair for about 2 weeks. She is not having any increased productive cough, no fevers or chills. No body aches or recent illness. Eating fine. She went to see her doctor today but she was so out of breath and wheezing at that they sent her straight to the hospital without seeing her and therefore did not refill her prescriptions today.      Past Medical History:  Diagnosis Date  . Anxiety   . Asthma   . Constipation   . COPD (chronic obstructive pulmonary disease) ([place])   . Depression   . Diabetes ([place])   . GERD (gastroesophageal reflux disease)   . Hypertension   . Sleep apnea      Patient Active Problem List   Diagnosis Date Noted  . Acute non-recurrent frontal sinusitis 06/17/2020  . Cough 06/17/2020  . Wheezing 06/17/2020  . Furuncle of axilla 03/14/2019  . Uncontrolled type 2 diabetes mellitus with hyperglycemia ([place]) 03/14/2019  . Sepsis ([place]) 11/17/2018  . Hypertension 12/21/2017  . Type 2 diabetes mellitus with hyperglycemia ([place]) 12/21/2017  . GERD (gastroesophageal reflux disease) 12/21/2017  . Constipation 12/21/2017  . Depression 12/21/2017  . Anxiety 12/21/2017     Past Surgical History:   Procedure Laterality Date  . APPENDECTOMY    . CHOLECYSTECTOMY    . COLONOSCOPY WITH PROPOFOL N/A 07/20/2018   Procedure: COLONOSCOPY WITH PROPOFOL;  Surgeon: Toledo, Benay Pike, MD;  Location: [facility] ENDOSCOPY;  Service: Gastroenterology;  Laterality: N/A;  . ESOPHAGOGASTRODUODENOSCOPY N/A 07/20/2018   Procedure: ESOPHAGOGASTRODUODENOSCOPY (EGD);  Surgeon: Toledo, Benay Pike, MD;  Location: [facility] ENDOSCOPY;  Service: Gastroenterology;  Laterality: N/A;  . GASTRECTOMY    . TUBAL LIGATION    . VAGINAL HYSTERECTOMY       Prior to Admission medications   Medication Sig Start Date End Date Taking? Authorizing Provider  Accu-Chek FastClix Lancets MISC Use as directed twice daily diag e11.65 06/17/20   Kendell Bane, NP  ACCU-CHEK GUIDE test strip Use twice a daily 09/16/20   Lavera Guise, MD  albuterol (VENTOLIN HFA) 108 (90 Base) MCG/ACT inhaler Inhale 2-4 puffs by mouth every 4 hours as needed for wheezing, cough, and/or shortness of breath 10/08/20   Carrie Mew, MD  ALPRAZolam Duanne Moron) 0.5 MG tablet Take 0.5-1 mg by mouth See admin instructions. Take 1 tablet (0.5mg ) by mouth twice daily and 2 tablets (1mg ) by mouth at bedtime    [provider]  amLODipine (NORVASC) 5 MG tablet Take 1 tablet (5 mg total) by mouth daily. 07/29/20   Ronnell Freshwater, NP  asenapine (SAPHRIS) 5 MG SUBL 24 hr tablet Place 5 mg under the tongue daily. 10/02/20   [provider]  bisoprolol (ZEBETA) 10 MG tablet Take 1 tablet (10 mg total) by mouth daily. 10/08/20  Carrie Mew, MD  buPROPion (WELLBUTRIN XL) 300 MG 24 hr tablet Take 1 tablet (300 mg total) by mouth daily. 10/08/20   Carrie Mew, MD  busPIRone (BUSPAR) 15 MG tablet Take 15 mg by mouth 2 (two) times daily.  11/08/17   [provider]  dapagliflozin propanediol (FARXIGA) 5 MG TABS tablet Take 1 tablet (5 mg total) by mouth daily. 10/08/20   Carrie Mew, MD  doxycycline (VIBRAMYCIN) 100 MG capsule Take 1 capsule  (100 mg total) by mouth 2 (two) times daily for 10 days. 10/08/20 10/18/20  Carrie Mew, MD  Dulaglutide (TRULICITY) 1.5 VH/8.4ON SOPN inject 1.5 mg once weekly for diabetes 10/08/20   Carrie Mew, MD  DULoxetine (CYMBALTA) 60 MG capsule Take 60 mg by mouth daily.  11/08/17   [provider]  famotidine (PEPCID) 20 MG tablet Take 20 mg by mouth 2 (two) times daily.    [provider]  fluticasone (FLONASE) 50 MCG/ACT nasal spray ONE SPRAY IN EACH NOSTRIL TWICE DAILY. 10/08/20   Carrie Mew, MD  Fluticasone-Salmeterol [facility] INHUB) 250-50 MCG/DOSE AEPB INHALE 1 PUFF TWICE DAILY. RINSE MOUTH AFTER USE. 10/08/20   Carrie Mew, MD  gabapentin (NEURONTIN) 300 MG capsule Take 1 capsule (300 mg total) by mouth 2 (two) times daily. 07/31/20   Ronnell Freshwater, NP  GLOBAL EASE INJECT PEN NEEDLES 32G X 4 MM MISC Use as directed with insulin 05/30/20   Scar[place], Audie Clear, NP  Insulin Glargine (BASAGLAR KWIKPEN) 100 UNIT/ML Inject 32 Units into the skin daily. 10/08/20   Carrie Mew, MD  insulin lispro (HUMALOG) 100 UNIT/ML injection Inject 0.12 mLs (12 Units total) into the skin 3 (three) times daily before meals. 10/08/20   Carrie Mew, MD  Insulin Pen Needle (BD PEN NEEDLE NANO U/F) 32G X 4 MM MISC PT changed PCP so no longer our PT 10/08/20   Carrie Mew, MD  Insulin Syringe-Needle U-100 (GLOBAL INJECT EASE INSULIN SYR) 31G X 5/16" 1 ML MISC Use as directed 3 times daily with humalog DX E11.65 10/08/20   Carrie Mew, MD  LATUDA 40 MG TABS tablet Take 40 mg by mouth at bedtime. 10/02/20   [provider]  linaclotide Rolan Lipa) 290 MCG CAPS capsule Take 1 capsule (290 mcg total) by mouth daily before breakfast. 10/08/20   Carrie Mew, MD  meloxicam (MOBIC) 15 MG tablet Take 1 tablet (15 mg total) by mouth at bedtime. 10/08/20   Carrie Mew, MD  montelukast (SINGULAIR) 10 MG tablet Take 1 tablet (10 mg total) by mouth daily. 10/08/20    Carrie Mew, MD  Olopatadine HCl 0.2 % SOLN Place 1 drop into both eyes daily. 10/08/20   Carrie Mew, MD  predniSONE (DELTASONE) 20 MG tablet Take 2 tablets (40 mg total) by mouth daily. 10/08/20   Carrie Mew, MD  rosuvastatin (CRESTOR) 10 MG tablet Take 1 tablet (10 mg total) by mouth daily. 10/08/20   Carrie Mew, MD  TRAVATAN Z 0.004 % SOLN ophthalmic solution Place 1 drop into both eyes at bedtime. 10/08/20   Carrie Mew, MD     Allergies Ace inhibitors   Family History  Problem Relation Age of Onset  . Hypertension Mother   . Atrial fibrillation Mother   . Heart attack Mother   . Hypertension Father   . Stomach cancer Father   . Diabetes Father   . Liver cancer Father   . Diabetes Sister   . Hypertension Sister     Social History Social History  Tobacco Use  . Smoking status: Former Research scientist (life sciences)  . Smokeless tobacco: Never Used  . Tobacco comment: 4 months ago  Vaping Use  . Vaping Use: Some days  Substance Use Topics  . Alcohol use: Not Currently    Comment: monthly or less   . Drug use: No    Review of Systems  Constitutional:   No fever or chills.  ENT:   No sore throat. No rhinorrhea. Cardiovascular: Positive chest tightness without syncope. Respiratory: Positive shortness of breath and nonproductive cough. Gastrointestinal:   Negative for abdominal pain, vomiting and diarrhea.  Musculoskeletal:   Negative for focal pain or swelling All other systems reviewed and are negative except as documented above in ROS and HPI.  ____________________________________________   PHYSICAL EXAM:  VITAL SIGNS: ED Triage Vitals  Enc Vitals Group     BP 10/08/20 1457 (!) 168/95     Pulse Rate 10/08/20 1457 91     Resp 10/08/20 1457 (!) 24     Temp 10/08/20 1457 99 F (37.2 C)     Temp Source 10/08/20 1457 Oral     SpO2 10/08/20 1457 96 %     Weight 10/08/20 1458 218 lb (98.9 kg)     Height 10/08/20 1458 5\' 4"  (1.626 m)     Head  Circumference --      Peak Flow --      Pain Score 10/08/20 1458 4     Pain Loc --      Pain Edu? --      Excl. in [place]? --     Vital signs reviewed, nursing assessments reviewed.   Constitutional:   Alert and oriented. Non-toxic appearance. Eyes:   Conjunctivae are normal. EOMI. PERRL. ENT      Head:   Normocephalic and atraumatic.      Nose:   Wearing a mask.      Mouth/Throat:   Wearing a mask.      Neck:   No meningismus. Full ROM. Hematological/Lymphatic/Immunilogical:   No cervical lymphadenopathy. Cardiovascular:   RRR. Symmetric bilateral radial and DP pulses.  No murmurs. Cap refill less than 2 seconds. Respiratory: Mild tachypnea. Diffuse expiratory wheezing and prolonged expiratory phase. No focal crackles. Gastrointestinal:   Soft and nontender. Non distended. There is no CVA tenderness.  No rebound, rigidity, or guarding.  Musculoskeletal:   Normal range of motion in all extremities. No joint effusions.  No lower extremity tenderness.  No edema. Neurologic:   Normal speech and language.  Motor grossly intact. No acute focal neurologic deficits are appreciated.  Skin:    Skin is warm, dry and intact. No rash noted.  No petechiae, purpura, or bullae.  ____________________________________________    LABS (pertinent positives/negatives) (all labs ordered are listed, but only abnormal results are displayed) Labs Reviewed  BASIC METABOLIC PANEL - Abnormal; Notable for the following components:      Result Value   Glucose, Bld 295 (*)    Creatinine, Ser 1.06 (*)    GFR, Estimated 58 (*)    All other components within normal limits  CBC - Abnormal; Notable for the following components:   WBC 11.7 (*)    MCH 25.6 (*)    RDW 16.8 (*)    All other components within normal limits  TROPONIN I (HIGH SENSITIVITY)  TROPONIN I (HIGH SENSITIVITY)   ____________________________________________   EKG  Interpreted by me Normal sinus rhythm rate of 85, normal axis, normal  intervals. Normal QRS ST segments and T  waves.  ____________________________________________    RADIOLOGY  DG Chest 2 View  Result Date: 10/08/2020 CLINICAL DATA:  chest tightness and sob EXAM: CHEST - 2 VIEW COMPARISON:  08/10/2020 and prior. FINDINGS: Hypoinflated lungs. Minimal patchy bibasilar opacities, likely atelectasis. No pneumothorax or pleural effusion. Stable cardiomediastinal silhouette. No acute osseous abnormality. IMPRESSION: No acute airspace disease.  Minimal bibasilar atelectasis. Electronically Signed   By: Primitivo Gauze M.D.   On: 10/08/2020 15:52    ____________________________________________   PROCEDURES Procedures  ____________________________________________  DIFFERENTIAL DIAGNOSIS   COPD exacerbation, pneumonia, pneumothorax, pleural effusion, pulmonary edema  CLINICAL IMPRESSION / ASSESSMENT AND PLAN / ED COURSE  Medications ordered in the ED: Medications  ipratropium-albuterol (DUONEB) 0.5-2.5 (3) MG/3ML nebulizer solution 3 mL (3 mLs Nebulization Given 10/08/20 2007)  predniSONE (DELTASONE) tablet 40 mg (40 mg Oral Given 10/08/20 2108)  ipratropium-albuterol (DUONEB) 0.5-2.5 (3) MG/3ML nebulizer solution 3 mL (3 mLs Nebulization Given 10/08/20 2111)  albuterol (PROVENTIL) (2.5 MG/3ML) 0.083% nebulizer solution 5 mg (5 mg Nebulization Given 10/08/20 2109)  doxycycline (VIBRA-TABS) tablet 100 mg (100 mg Oral Given 10/08/20 2108)    Pertinent labs & imaging results that were available during my care of the patient were reviewed by me and considered in my medical decision making (see chart for details).  Rachel Nielsen was evaluated in Emergency Department on 10/08/2020 for the symptoms described in the history of present illness. She was evaluated in the context of the global COVID-19 pandemic, which necessitated consideration that the patient might be at risk for infection with the SARS-CoV-2 virus that causes COVID-19. Institutional protocols and  algorithms that pertain to the evaluation of patients at risk for COVID-19 are in a state of rapid change based on information released by regulatory bodies including the CDC and federal and state organizations. These policies and algorithms were followed during the patient's care in the ED.   Patient presents clinically with COPD exacerbation. Doubt ACS, PE, dissection, pericardial effusion or carditis.  Patient given Solu-Medrol, bronchodilators and feels much better. Wheezing resolved, vital signs essentially normal except for mild hypertension. Refills for prescriptions sent, doxycycline and prednisone added. She can follow-up with her primary care doctor.      ____________________________________________   FINAL CLINICAL IMPRESSION(S) / ED DIAGNOSES    Final diagnoses:  COPD exacerbation ([place])  Type 2 diabetes mellitus without complication, with long-term current use of insulin [facility])     ED Discharge Orders         Ordered    albuterol (VENTOLIN HFA) 108 (90 Base) MCG/ACT inhaler       Note to Pharmacy: Pharmacy may substitute brand and size for insurance-approved equivalent   10/08/20 2228    bisoprolol (ZEBETA) 10 MG tablet  Daily       Note to Pharmacy: No longer our PT   10/08/20 2228    buPROPion (WELLBUTRIN XL) 300 MG 24 hr tablet  Daily        10/08/20 2228    dapagliflozin propanediol (FARXIGA) 5 MG TABS tablet  Daily       Note to Pharmacy: No longer our PT   10/08/20 2228    Dulaglutide (TRULICITY) 1.5 QP/6.1PJ SOPN       Note to Pharmacy: Pt is no longer with [place] only 30 days   10/08/20 2228    fluticasone (FLONASE) 50 MCG/ACT nasal spray       Note to Pharmacy: No longer our PT   10/08/20 2228    Insulin Glargine (BASAGLAR  KWIKPEN) 100 UNIT/ML  Daily       Note to Pharmacy: Pt need appt for further refills   10/08/20 2228    insulin lispro (HUMALOG) 100 UNIT/ML injection  3 times daily before meals       Note to Pharmacy: Dose increased   10/08/20 2228     Insulin Pen Needle (BD PEN NEEDLE NANO U/F) 32G X 4 MM MISC        10/08/20 2228    Insulin Syringe-Needle U-100 (GLOBAL INJECT EASE INSULIN SYR) 31G X 5/16" 1 ML MISC        10/08/20 2228    linaclotide (LINZESS) 290 MCG CAPS capsule       Note to Pharmacy: No longer our PT   10/08/20 2228    meloxicam (MOBIC) 15 MG tablet  Daily at bedtime        10/08/20 2228    montelukast (SINGULAIR) 10 MG tablet  Daily        10/08/20 2228    Olopatadine HCl 0.2 % SOLN  Daily        10/08/20 2228    rosuvastatin (CRESTOR) 10 MG tablet  Daily        10/08/20 2228    TRAVATAN Z 0.004 % SOLN ophthalmic solution  Daily at bedtime        10/08/20 2228    Fluticasone-Salmeterol (WIXELA INHUB) 250-50 MCG/DOSE AEPB        10/08/20 2228    doxycycline (VIBRAMYCIN) 100 MG capsule  2 times daily        10/08/20 2228    predniSONE (DELTASONE) 20 MG tablet  Daily        10/08/20 2231          Portions of this note were generated with dragon dictation software. Dictation errors may occur despite best attempts at proofreading.   Carrie Mew, MD 10/08/20 2237

## 2020-10-08 NOTE — ED Notes (Signed)
Patient extremely SOB, unable to speak in complete sentences, audible expiratory wheezing.  Requested a verbal order for DuoNeb prior to MD assignment

## 2020-10-08 NOTE — Discharge Instructions (Signed)
Your lab tests and chest x-ray today look okay. Continue taking all of your home medications. We have sent refills to [facility]. Please follow-up with your doctor when possible to ensure your breathing symptoms have resolved.

## 2020-10-21 ENCOUNTER — Other Ambulatory Visit: Payer: Self-pay

## 2020-10-21 ENCOUNTER — Ambulatory Visit (INDEPENDENT_AMBULATORY_CARE_PROVIDER_SITE_OTHER): Payer: Medicare HMO | Admitting: Family Medicine

## 2020-10-21 ENCOUNTER — Encounter: Payer: Self-pay | Admitting: Family Medicine

## 2020-10-21 VITALS — BP 142/81 | HR 79 | Temp 97.1°F | Resp 20 | Ht 64.0 in | Wt 219.0 lb

## 2020-10-21 DIAGNOSIS — Z23 Encounter for immunization: Secondary | ICD-10-CM | POA: Insufficient documentation

## 2020-10-21 DIAGNOSIS — Z7689 Persons encountering health services in other specified circumstances: Secondary | ICD-10-CM | POA: Diagnosis not present

## 2020-10-21 DIAGNOSIS — E1165 Type 2 diabetes mellitus with hyperglycemia: Secondary | ICD-10-CM

## 2020-10-21 DIAGNOSIS — Z8673 Personal history of transient ischemic attack (TIA), and cerebral infarction without residual deficits: Secondary | ICD-10-CM | POA: Insufficient documentation

## 2020-10-21 LAB — POCT GLYCOSYLATED HEMOGLOBIN (HGB A1C): Hemoglobin A1C: 8.5 % — AB (ref 4.0–5.6)

## 2020-10-21 MED ORDER — ALBUTEROL SULFATE HFA 108 (90 BASE) MCG/ACT IN AERS: INHALATION_SPRAY | RESPIRATORY_TRACT | 0 refills | Status: AC

## 2020-10-21 NOTE — Assessment & Plan Note (Signed)
Reports hx of CVA, has not yet met with Neurology and reports has not had physical therapy to work on her left sided weakness.  Referral to Neurology placed. 

## 2020-10-21 NOTE — Progress Notes (Signed)
Subjective:    Patient ID: Rachel Nielsen, female    DOB: 03/15/1951, 69 y.o.   MRN: [id number]  Rachel Nielsen is a 69 y.o. female presenting on 10/21/2020 for Establish Care   HPI  Rachel Nielsen presents to clinic to establish for primary care.  Previous PCP was at [facility] [facility].  Records will be requested.  Past medical, family, and surgical history reviewed w/ pt.  She has acute concerns today for referral to Neurology and Endocrinology.  Reports she has been having left sided weakness and that her previous PCP had recommended she follow with Neurology, but has not had a referral received.  Requesting referral to Endocrinology to establish for management of her diabetes.  She has no other acute concerns today.   Depression screen Rachel Nielsen 2/9 11/07/2019 11/07/2019 06/07/2019  Decreased Interest 1 1 1   Down, Depressed, Hopeless 0 0 1  PHQ - 2 Score 1 1 2   Altered sleeping - - 2  Tired, decreased energy - - 2  Change in appetite - - 1  Feeling bad or failure about yourself  - - 1  Trouble concentrating - - 1  Moving slowly or fidgety/restless - - 1  Suicidal thoughts - - 0  PHQ-9 Score - - 10  Difficult doing work/chores - - -    Social History   Tobacco Use  . Smoking status: Former Research scientist (life sciences)  . Smokeless tobacco: Never Used  . Tobacco comment: 4 months ago  Vaping Use  . Vaping Use: Some days  Substance Use Topics  . Alcohol use: Not Currently    Comment: monthly or less   . Drug use: No    Review of Systems  Constitutional: Negative.   HENT: Negative.   Eyes: Negative.   Respiratory: Negative.   Cardiovascular: Negative.   Gastrointestinal: Negative.   Endocrine: Negative.   Genitourinary: Negative.   Musculoskeletal: Negative.   Skin: Negative.   Allergic/Immunologic: Negative.   Neurological: Positive for weakness. Negative for dizziness, tremors, seizures, syncope, facial asymmetry, speech difficulty, light-headedness, numbness and headaches.  Hematological: Negative.    Psychiatric/Behavioral: Negative.    Per HPI unless specifically indicated above     Objective:    BP (!) 142/81 (BP Location: Right Arm, Patient Position: Sitting, Cuff Size: Normal)   Pulse 79   Temp (!) 97.1 F (36.2 C) (Temporal)   Resp 20   Ht 5' 4"  (1.626 m)   Wt 219 lb (99.3 kg)   SpO2 100%   BMI 37.59 kg/m   Wt Readings from Last 3 Encounters:  10/21/20 219 lb (99.3 kg)  10/08/20 218 lb (98.9 kg)  09/21/20 218 lb (98.9 kg)    Physical Exam Vitals and nursing note reviewed.  Constitutional:      General: She is not in acute distress.    Appearance: Normal appearance. She is well-developed and well-groomed. She is obese. She is not ill-appearing or toxic-appearing.  HENT:     Head: Normocephalic and atraumatic.     Nose:     Comments: Rachel Nielsen is in place, covering mouth and nose. Eyes:     General: Lids are normal. Vision grossly intact.        Right eye: No discharge.        Left eye: No discharge.     Extraocular Movements: Extraocular movements intact.     Conjunctiva/sclera: Conjunctivae normal.     Pupils: Pupils are equal, round, and reactive to light.  Cardiovascular:  Rate and Rhythm: Normal rate and regular rhythm.     Pulses: Normal pulses.     Heart sounds: Normal heart sounds. No murmur heard. No friction rub. No gallop.   Pulmonary:     Effort: Pulmonary effort is normal. No respiratory distress.     Breath sounds: Normal breath sounds.  Musculoskeletal:     Comments: Left sided UE & LE 4/5 strength.  RUE & RLE 5/5 strength  Skin:    General: Skin is warm and dry.     Capillary Refill: Capillary refill takes less than 2 seconds.  Neurological:     General: No focal deficit present.     Mental Status: She is alert and oriented to person, place, and time.  Psychiatric:        Attention and Perception: Attention and perception normal.        Mood and Affect: Mood and affect normal.        Speech: Speech normal.        Behavior: Behavior  normal. Behavior is cooperative.        Thought Content: Thought content normal.        Cognition and Memory: Cognition and memory normal.        Judgment: Judgment normal.    Results for orders placed or performed in visit on 10/21/20  POCT HgB A1C  Result Value Ref Range   Hemoglobin A1C 8.5 (A) 4.0 - 5.6 %   HbA1c POC (<> result, manual entry)     HbA1c, POC (prediabetic range)     HbA1c, POC (controlled diabetic range)        Assessment & Plan:   Problem List Items Addressed This Visit      Endocrine   Type 2 diabetes mellitus with hyperglycemia (HCC)    A1C 8.5%, improved from 9.3% in March 2021.  Has been taking farxiga 45m daily, basaglar 32 units daily, and humalog 12 units TID WC.  Reports is tolerating well.  Will continue medications.  Referral to endocrinology placed, as requested by patient.      Relevant Orders   Ambulatory referral to Endocrinology   POCT HgB A1C (Completed)     Other   Encounter to establish care with new doctor - Primary    [facility]    Will have baseline labs drawn and RTC in 2-4 weeks for CPE      Relevant Orders   CBC with Differential   COMPLETE METABOLIC PANEL WITH GFR   Lipid Profile   TSH + free T4   Needs flu shot    Pt > age 69  Needs annual influenza vaccine.  VIS provided.  Plan: 1. Administer high dose fluzone today.       Relevant Orders   Flu Vaccine QUAD High Dose(Fluad) (Completed)   History of stroke    Reports hx of CVA, has not yet met with Neurology and reports has not had physical therapy to work on her left sided weakness.  Referral to Neurology placed.      Relevant Orders   Ambulatory referral to Neurology      Meds ordered this encounter  Medications  . albuterol (VENTOLIN HFA) 108 (90 Base) MCG/ACT inhaler    Sig: Inhale 2-4 puffs by mouth every 4 hours as needed for wheezing, cough, and/or shortness of breath    Dispense:  6.7 g    Refill:  0     Pharmacy may substitute brand  and size for insurance-approved equivalent   Follow up plan: Return in about 4 weeks (around 11/18/2020) for CPE.   Harlin Rain, [place] Family Nurse Practitioner [facility] 10/21/2020, 4:40 PM

## 2020-10-21 NOTE — Assessment & Plan Note (Signed)
Pt > age 69.  Needs annual influenza vaccine.  VIS provided.  Plan: 1. Administer high dose fluzone today.  

## 2020-10-21 NOTE — Assessment & Plan Note (Signed)
New patient establishment at [facility] for primary care.    Will have baseline labs drawn and RTC in 2-4 weeks for CPE

## 2020-10-21 NOTE — Assessment & Plan Note (Signed)
A1C 8.5%, improved from 9.3% in March 2021.  Has been taking farxiga 5mg  daily, basaglar 32 units daily, and humalog 12 units TID WC.  Reports is tolerating well.  Will continue medications.  Referral to endocrinology placed, as requested by patient.

## 2020-10-21 NOTE — Patient Instructions (Addendum)
As we discussed, have your labs drawn in the next 1-2 weeks and we will contact you with the results.  Continue all medications as prescribed  A referral to Neurology for left sided weakness after stroke has been placed today.  If you have not heard from the specialty office or our referral coordinator within 1 week, please let us know and we will follow up with the referral coordinator for an update.  A referral to Endocrinology, per your request for your diabetes, has been placed today.  If you have not heard from the specialty office or our referral coordinator within 1 week, please let us know and we will follow up with the referral coordinator for an update.  We will plan to see you back in 2-4 weeks for your physical  You will receive a survey after today's visit either digitally by e-mail or paper by Miguel Barrera mail. Your experiences and feedback matter to [place].  Please respond so we know how we are doing as we provide care for you.  Call us with any questions/concerns/needs.  It is my goal to be available to you for your health concerns.  Thanks for choosing me to be a partner in your healthcare needs!  Harlin Rain, FNP-C Family Nurse Practitioner [facility] Phone: [phone]

## 2020-11-04 ENCOUNTER — Other Ambulatory Visit: Payer: Self-pay | Admitting: Internal Medicine

## 2020-11-05 ENCOUNTER — Other Ambulatory Visit: Payer: Self-pay | Admitting: Family Medicine

## 2020-11-05 DIAGNOSIS — K5900 Constipation, unspecified: Secondary | ICD-10-CM

## 2020-11-05 DIAGNOSIS — I10 Essential (primary) hypertension: Secondary | ICD-10-CM

## 2020-11-05 NOTE — Telephone Encounter (Signed)
Requested medication (s) are due for refill today - yes  Requested medication (s) are on the active medication list yes  Future visit scheduled -no  Last refill: 1 month  Notes to clinic: Patient is requesting RF of medications prescribed by outside provider- ED provider- sent for PCP review  Requested Prescriptions  Pending Prescriptions Disp Refills   meloxicam (MOBIC) 15 MG tablet 30 tablet 0    Sig: Take 1 tablet (15 mg total) by mouth at bedtime.      Analgesics:  COX2 Inhibitors Failed - 11/05/2020  1:36 PM      Failed - Cr in normal range and within 360 days    Creatinine, Ser  Date Value Ref Range Status  10/08/2020 1.06 (H) 0.44 - 1.00 mg/dL Final          Passed - HGB in normal range and within 360 days    Hemoglobin  Date Value Ref Range Status  10/08/2020 12.5 12.0 - 15.0 g/dL Final          Passed - Patient is not pregnant      Passed - Valid encounter within last 12 months    Recent Outpatient Visits           2 weeks ago Encounter to establish care with new doctor   [facility], Lupita Raider, FNP                  dapagliflozin propanediol (FARXIGA) 5 MG TABS tablet 30 tablet 0    Sig: Take 1 tablet (5 mg total) by mouth daily.      Endocrinology:  Diabetes - SGLT2 Inhibitors Failed - 11/05/2020  1:36 PM      Failed - Cr in normal range and within 360 days    Creatinine, Ser  Date Value Ref Range Status  10/08/2020 1.06 (H) 0.44 - 1.00 mg/dL Final          Failed - LDL in normal range and within 360 days    No results found for: LDLCALC, LDLC, HIRISKLDL, POCLDL, LDLDIRECT, REALLDLC, TOTLDLC        Failed - HBA1C is between 0 and 7.9 and within 180 days    Hemoglobin A1C  Date Value Ref Range Status  10/21/2020 8.5 (A) 4.0 - 5.6 % Final          Failed - AA eGFR in normal range and within 360 days    GFR calc Af Amer  Date Value Ref Range Status  08/03/2020 59 (L) >60 mL/min Final   GFR, Estimated  Date Value Ref  Range Status  10/08/2020 58 (L) >60 mL/min Final    Comment:    (NOTE) Calculated using the CKD-EPI Creatinine Equation (2021)           Passed - Valid encounter within last 6 months    Recent Outpatient Visits           2 weeks ago Encounter to establish care with new doctor   [facility], Lupita Raider, FNP                  fluticasone [facility]) 50 MCG/ACT nasal spray 16 g 0    Sig: ONE SPRAY IN EACH NOSTRIL TWICE DAILY.      Ear, Nose, and Throat: Nasal Preparations - Corticosteroids Passed - 11/05/2020  1:36 PM      Passed - Valid encounter within last 12 months    Recent Outpatient Visits  2 weeks ago Encounter to establish care with new doctor   [facility], Lupita Raider, FNP                  linaclotide [facility]) 290 MCG CAPS capsule 30 capsule 0    Sig: Take 1 capsule (290 mcg total) by mouth daily before breakfast.      Gastroenterology: Irritable Bowel Syndrome Passed - 11/05/2020  1:36 PM      Passed - Valid encounter within last 12 months    Recent Outpatient Visits           2 weeks ago Encounter to establish care with new doctor   [facility], Lupita Raider, FNP                  bisoprolol (ZEBETA) 10 MG tablet 30 tablet 0    Sig: Take 1 tablet (10 mg total) by mouth daily.      Cardiovascular:  Beta Blockers Failed - 11/05/2020  1:36 PM      Failed - Last BP in normal range    BP Readings from Last 1 Encounters:  10/21/20 (!) 142/81          Passed - Last Heart Rate in normal range    Pulse Readings from Last 1 Encounters:  10/21/20 79          Passed - Valid encounter within last 6 months    Recent Outpatient Visits           2 weeks ago Encounter to establish care with new doctor   [facility], Lupita Raider, [place]                    Requested Prescriptions  Pending Prescriptions Disp Refills   meloxicam (MOBIC) 15 MG tablet 30  tablet 0    Sig: Take 1 tablet (15 mg total) by mouth at bedtime.      Analgesics:  COX2 Inhibitors Failed - 11/05/2020  1:36 PM      Failed - Cr in normal range and within 360 days    Creatinine, Ser  Date Value Ref Range Status  10/08/2020 1.06 (H) 0.44 - 1.00 mg/dL Final          Passed - HGB in normal range and within 360 days    Hemoglobin  Date Value Ref Range Status  10/08/2020 12.5 12.0 - 15.0 g/dL Final          Passed - Patient is not pregnant      Passed - Valid encounter within last 12 months    Recent Outpatient Visits           2 weeks ago Encounter to establish care with new doctor   [facility], Lupita Raider, FNP                  dapagliflozin propanediol (FARXIGA) 5 MG TABS tablet 30 tablet 0    Sig: Take 1 tablet (5 mg total) by mouth daily.      Endocrinology:  Diabetes - SGLT2 Inhibitors Failed - 11/05/2020  1:36 PM      Failed - Cr in normal range and within 360 days    Creatinine, Ser  Date Value Ref Range Status  10/08/2020 1.06 (H) 0.44 - 1.00 mg/dL Final          Failed - LDL in normal range and within 360 days  No results found for: LDLCALC, LDLC, HIRISKLDL, POCLDL, LDLDIRECT, REALLDLC, TOTLDLC        Failed - HBA1C is between 0 and 7.9 and within 180 days    Hemoglobin A1C  Date Value Ref Range Status  10/21/2020 8.5 (A) 4.0 - 5.6 % Final          Failed - AA eGFR in normal range and within 360 days    GFR calc Af Amer  Date Value Ref Range Status  08/03/2020 59 (L) >60 mL/min Final   GFR, Estimated  Date Value Ref Range Status  10/08/2020 58 (L) >60 mL/min Final    Comment:    (NOTE) Calculated using the CKD-EPI Creatinine Equation (2021)           Passed - Valid encounter within last 6 months    Recent Outpatient Visits           2 weeks ago Encounter to establish care with new doctor   [facility], Lupita Raider, FNP                  fluticasone [facility]) 50 MCG/ACT  nasal spray 16 g 0    Sig: ONE SPRAY IN EACH NOSTRIL TWICE DAILY.      Ear, Nose, and Throat: Nasal Preparations - Corticosteroids Passed - 11/05/2020  1:36 PM      Passed - Valid encounter within last 12 months    Recent Outpatient Visits           2 weeks ago Encounter to establish care with new doctor   [facility], Lupita Raider, FNP                  linaclotide (LINZESS) 290 MCG CAPS capsule 30 capsule 0    Sig: Take 1 capsule (290 mcg total) by mouth daily before breakfast.      Gastroenterology: Irritable Bowel Syndrome Passed - 11/05/2020  1:36 PM      Passed - Valid encounter within last 12 months    Recent Outpatient Visits           2 weeks ago Encounter to establish care with new doctor   [facility], Lupita Raider, FNP                  bisoprolol (ZEBETA) 10 MG tablet 30 tablet 0    Sig: Take 1 tablet (10 mg total) by mouth daily.      Cardiovascular:  Beta Blockers Failed - 11/05/2020  1:36 PM      Failed - Last BP in normal range    BP Readings from Last 1 Encounters:  10/21/20 (!) 142/81          Passed - Last Heart Rate in normal range    Pulse Readings from Last 1 Encounters:  10/21/20 79          Passed - Valid encounter within last 6 months    Recent Outpatient Visits           2 weeks ago Encounter to establish care with new doctor   [facility], Lupita Raider, [place]

## 2020-11-05 NOTE — Telephone Encounter (Signed)
Medication Refill - Medication: meloxicam, linzess, fluticasone, farxiga, bisoprolol   Has the patient contacted their pharmacy? Yes.   (Agent: If no, request that the patient contact the pharmacy for the refill.) (Agent: If yes, when and what did the pharmacy advise?)  Preferred Pharmacy (with phone number or street name):  [facility] [place], [place] - [address]  [address] [place] [address]  Phone: [phone] Fax: [phone]  Hours: Not open 24 hours    Agent: Please be advised that RX refills may take up to 3 business days. We ask that you follow-up with your pharmacy.

## 2020-11-06 MED ORDER — BISOPROLOL FUMARATE 10 MG PO TABS: 10.0000 mg | ORAL_TABLET | Freq: Every day | ORAL | 0 refills | Status: DC

## 2020-11-06 MED ORDER — DAPAGLIFLOZIN PROPANEDIOL 5 MG PO TABS
5.0000 mg | ORAL_TABLET | Freq: Every day | ORAL | 0 refills | Status: DC
Start: 2020-11-06 — End: 2020-12-04

## 2020-11-06 MED ORDER — MELOXICAM 15 MG PO TABS
15.0000 mg | ORAL_TABLET | Freq: Every day | ORAL | 0 refills | Status: DC
Start: 2020-11-06 — End: 2020-12-04

## 2020-11-06 MED ORDER — FLUTICASONE PROPIONATE 50 MCG/ACT NA SUSP
NASAL | 0 refills | Status: DC
Start: 2020-11-06 — End: 2020-12-04

## 2020-11-06 MED ORDER — LINACLOTIDE 290 MCG PO CAPS: ORAL_CAPSULE | ORAL | 0 refills | Status: DC

## 2020-11-07 ENCOUNTER — Telehealth: Payer: Self-pay

## 2020-11-07 ENCOUNTER — Other Ambulatory Visit: Payer: Self-pay | Admitting: Family Medicine

## 2020-11-07 DIAGNOSIS — H04129 Dry eye syndrome of unspecified lacrimal gland: Secondary | ICD-10-CM

## 2020-11-07 DIAGNOSIS — E1165 Type 2 diabetes mellitus with hyperglycemia: Secondary | ICD-10-CM

## 2020-11-07 MED ORDER — OLOPATADINE HCL 0.2 % OP SOLN: 1.0000 [drp] | Freq: Every day | OPHTHALMIC | 0 refills | Status: DC

## 2020-11-07 MED ORDER — TRULICITY 1.5 MG/0.5ML ~~LOC~~ SOAJ: SUBCUTANEOUS | 1 refills | Status: DC

## 2020-11-07 NOTE — Telephone Encounter (Signed)
Rx sent to pharmacy on file.

## 2020-11-07 NOTE — Telephone Encounter (Signed)
Copied from CRM [id number]. Topic: Quick Communication - Rx Refill/Question >> Nov 07, 2020 10:50 AM Adrian Prince D wrote: Medication: Trulicity and Olo[atadine eye drop  Has the patient contacted their pharmacy? Yes.   (Agent: If no, request that the patient contact the pharmacy for the refill.) (Agent: If yes, when and what did the pharmacy advise?)  Preferred Pharmacy (with phone number or street name): [facility] [place], [place] - [address]  Agent: Please be advised that RX refills may take up to 3 business days. We ask that you follow-up with your pharmacy.

## 2020-11-08 ENCOUNTER — Other Ambulatory Visit: Payer: Self-pay

## 2020-11-11 ENCOUNTER — Other Ambulatory Visit: Payer: Self-pay

## 2020-11-11 MED ORDER — INSULIN SYRINGE-NEEDLE U-100 31G X 5/16" 1 ML MISC
1 refills | Status: DC
Start: 2020-11-11 — End: 2020-12-16

## 2020-11-12 ENCOUNTER — Ambulatory Visit: Payer: Medicare HMO | Admitting: Hospice and Palliative Medicine

## 2020-11-13 ENCOUNTER — Other Ambulatory Visit: Payer: Self-pay | Admitting: Family Medicine

## 2020-11-13 NOTE — Telephone Encounter (Signed)
Requested medication (s) are due for refill today:   Not sure  Requested medication (s) are on the active medication list:   Yes  Future visit scheduled:   No   New pt 3 wks ago with Elmyra Ricks   Last ordered: Last ordered by a historical provider Kendell Bane, NP   Requested Prescriptions  Pending Prescriptions Disp Refills   Insulin Pen Needle (GLOBAL EASE INJECT PEN NEEDLES) 32G X 4 MM MISC 100 each 0    Sig: See admin instructions. with insulin      Endocrinology: Diabetes - Testing Supplies Passed - 11/13/2020  3:15 PM      Passed - Valid encounter within last 12 months    Recent Outpatient Visits           3 weeks ago Encounter to establish care with new doctor   [facility], Lupita Raider, [place]

## 2020-11-13 NOTE — Telephone Encounter (Signed)
Copied from [place] [id number]. Topic: Quick Communication - Rx Refill/Question >> Nov 13, 2020  2:34 PM Tessa Lerner A wrote: Medication: Insulin Syringe-Needle U-100 (GLOBAL INJECT EASE INSULIN SYR) - patient has 1 syringe left   Has the patient contacted their pharmacy? Pharmacy contacted the [facility] on patient's behalf  Preferred Pharmacy (with phone number or street name): [facility] [place], [place]  Phone: [phone]  Agent: Please be advised that RX refills may take up to 3 business days. We ask that you follow-up with your pharmacy.

## 2020-11-27 ENCOUNTER — Other Ambulatory Visit: Payer: Self-pay

## 2020-11-27 MED ORDER — AMLODIPINE BESYLATE 5 MG PO TABS
5.0000 mg | ORAL_TABLET | Freq: Every day | ORAL | 3 refills | Status: DC
Start: 2020-11-27 — End: 2021-03-26

## 2020-11-27 MED ORDER — GABAPENTIN 300 MG PO CAPS: 300.0000 mg | ORAL_CAPSULE | Freq: Two times a day (BID) | ORAL | 3 refills | Status: DC

## 2020-11-29 ENCOUNTER — Other Ambulatory Visit: Payer: Self-pay

## 2020-11-29 DIAGNOSIS — H04129 Dry eye syndrome of unspecified lacrimal gland: Secondary | ICD-10-CM

## 2020-12-04 ENCOUNTER — Other Ambulatory Visit: Payer: Self-pay

## 2020-12-04 DIAGNOSIS — I10 Essential (primary) hypertension: Secondary | ICD-10-CM

## 2020-12-04 DIAGNOSIS — K5900 Constipation, unspecified: Secondary | ICD-10-CM

## 2020-12-04 MED ORDER — BISOPROLOL FUMARATE 10 MG PO TABS: 10.0000 mg | ORAL_TABLET | Freq: Every day | ORAL | 0 refills | Status: DC

## 2020-12-04 MED ORDER — LINACLOTIDE 290 MCG PO CAPS: ORAL_CAPSULE | ORAL | 0 refills | Status: DC

## 2020-12-04 MED ORDER — DAPAGLIFLOZIN PROPANEDIOL 5 MG PO TABS
5.0000 mg | ORAL_TABLET | Freq: Every day | ORAL | 0 refills | Status: DC
Start: 2020-12-04 — End: 2020-12-31

## 2020-12-04 MED ORDER — FLUTICASONE PROPIONATE 50 MCG/ACT NA SUSP
NASAL | 0 refills | Status: DC
Start: 2020-12-04 — End: 2020-12-31

## 2020-12-04 MED ORDER — MELOXICAM 15 MG PO TABS
15.0000 mg | ORAL_TABLET | Freq: Every day | ORAL | 0 refills | Status: DC
Start: 2020-12-04 — End: 2020-12-31

## 2020-12-16 ENCOUNTER — Other Ambulatory Visit: Payer: Self-pay

## 2020-12-16 DIAGNOSIS — M545 Low back pain, unspecified: Secondary | ICD-10-CM | POA: Diagnosis not present

## 2020-12-16 DIAGNOSIS — R531 Weakness: Secondary | ICD-10-CM | POA: Diagnosis not present

## 2020-12-16 DIAGNOSIS — R202 Paresthesia of skin: Secondary | ICD-10-CM | POA: Diagnosis not present

## 2020-12-16 DIAGNOSIS — M542 Cervicalgia: Secondary | ICD-10-CM | POA: Diagnosis not present

## 2020-12-16 DIAGNOSIS — R252 Cramp and spasm: Secondary | ICD-10-CM | POA: Diagnosis not present

## 2020-12-16 DIAGNOSIS — R200 Anesthesia of skin: Secondary | ICD-10-CM | POA: Diagnosis not present

## 2020-12-16 DIAGNOSIS — R2689 Other abnormalities of gait and mobility: Secondary | ICD-10-CM | POA: Diagnosis not present

## 2020-12-16 DIAGNOSIS — R0600 Dyspnea, unspecified: Secondary | ICD-10-CM | POA: Diagnosis not present

## 2020-12-16 DIAGNOSIS — R296 Repeated falls: Secondary | ICD-10-CM | POA: Diagnosis not present

## 2020-12-16 MED ORDER — INSULIN SYRINGE-NEEDLE U-100 31G X 5/16" 1 ML MISC
1 refills | Status: DC
Start: 2020-12-16 — End: 2020-12-17

## 2020-12-17 ENCOUNTER — Other Ambulatory Visit: Payer: Self-pay | Admitting: Neurology

## 2020-12-17 ENCOUNTER — Other Ambulatory Visit: Payer: Self-pay

## 2020-12-17 ENCOUNTER — Ambulatory Visit (INDEPENDENT_AMBULATORY_CARE_PROVIDER_SITE_OTHER): Payer: Medicare HMO | Admitting: Family Medicine

## 2020-12-17 ENCOUNTER — Encounter: Payer: Self-pay | Admitting: Family Medicine

## 2020-12-17 VITALS — BP 134/72 | HR 80 | Temp 97.7°F | Ht 64.0 in | Wt 221.6 lb

## 2020-12-17 DIAGNOSIS — G4733 Obstructive sleep apnea (adult) (pediatric): Secondary | ICD-10-CM

## 2020-12-17 DIAGNOSIS — F411 Generalized anxiety disorder: Secondary | ICD-10-CM

## 2020-12-17 DIAGNOSIS — E1165 Type 2 diabetes mellitus with hyperglycemia: Secondary | ICD-10-CM | POA: Diagnosis not present

## 2020-12-17 DIAGNOSIS — R531 Weakness: Secondary | ICD-10-CM

## 2020-12-17 DIAGNOSIS — E1142 Type 2 diabetes mellitus with diabetic polyneuropathy: Secondary | ICD-10-CM

## 2020-12-17 DIAGNOSIS — F419 Anxiety disorder, unspecified: Secondary | ICD-10-CM | POA: Insufficient documentation

## 2020-12-17 DIAGNOSIS — F330 Major depressive disorder, recurrent, mild: Secondary | ICD-10-CM

## 2020-12-17 DIAGNOSIS — F331 Major depressive disorder, recurrent, moderate: Secondary | ICD-10-CM

## 2020-12-17 DIAGNOSIS — J432 Centrilobular emphysema: Secondary | ICD-10-CM

## 2020-12-17 DIAGNOSIS — E1140 Type 2 diabetes mellitus with diabetic neuropathy, unspecified: Secondary | ICD-10-CM | POA: Insufficient documentation

## 2020-12-17 DIAGNOSIS — F32A Depression, unspecified: Secondary | ICD-10-CM | POA: Insufficient documentation

## 2020-12-17 MED ORDER — ACCU-CHEK FASTCLIX LANCETS MISC: 5 refills | Status: DC

## 2020-12-17 MED ORDER — ACCU-CHEK GUIDE VI STRP: ORAL_STRIP | 5 refills | Status: DC

## 2020-12-17 NOTE — Progress Notes (Signed)
Subjective:    Patient ID: Rachel Nielsen, female    DOB: 12/30/1950, 70 y.o.   MRN: [id number]  Rachel Nielsen is a 70 y.o. female presenting on 12/17/2020 for [place] (Pt states she only seen Rachel Nielsen once and since she is gone now wants to establish care with Rachel Nielsen. ), Balance issues (Has been having it for at least 2 years, the left leg is weak.), and Neck Pain (She fell around last year in July and still has pain and soreness on both sides.)  Previous PCP Rachel Skeeters, FNP  HPI    Neurology Rachel Nielsen) Last seen yesterday 12/16/20, they ordered MRI brain and nerve conduction study, and they have ordered Physical Therapy for balance. Years prior she had therapy but now interested to resume so she can improve balance and limit falls. - She was started on Aspirin 81mg  daily for prevention of stroke.   Type 2 Diabetes Upcoming apt with Rachel Nielsen at [facility] End[facility] on 12/23/20 Due for DM Foot exam today. She has diabetic neuropathy - She is currently Trulicity 1.5mg  weekly inj, Farxiga 5mg  daily, Humalog 12 units 3 times a day - She is due for refill on Accu chek Guide test strips and Accu chek fastclix lancets. She checks blood sugar twice a day. - Upcoming [facility] apt for DIabetic Eye Exam, she will request record.  Urinary Incontinence Nocturnal problem, often wakes her up.  Major Depression, recurrent moderate Generalized Anxiety Followed by [facility] - Rachel Nielsen She is currently on Duloxetine 60mg  daily, Latuda 40mg  daily, Buspar 15mg  BID, Wellbutrin, Xanax  History of OSA. She used to have CPAP machine She will need to be retested for sleep apnea in future. She request refer to sleep center  Epworth Sleepiness Scale Total Score: 13 Sitting and reading - 3 Watching TV - 3 Sitting inactive in a public place - 2 As a passenger in a car for an hour without a break - 2 Lying down to rest in the afternoon when circumstances  permit - 3 Sitting and talking to someone - 0 Sitting quietly after a lunch without alcohol - 0 In a car, while stopped for a few minutes in traffic - 0  STOP-Bang OSA scoring Snoring yes   Tiredness yes   Observed apneas yes   Pressure HTN yes   BMI > 35 kg/m2 yes   Age > 13  yes   Neck (female >17 in; Female >34 in)  no   Gender female no   OSA risk low (0-2)  OSA risk intermediate (3-4)  OSA risk high (5+)  Total: 6     Depression screen [facility] 2/9 12/17/2020 11/07/2019 11/07/2019  Decreased Interest 3 1 1   Down, Depressed, Hopeless 3 0 0  PHQ - 2 Score 6 1 1   Altered sleeping 2 - -  Tired, decreased energy 3 - -  Change in appetite 2 - -  Feeling bad or failure about yourself  3 - -  Trouble concentrating 3 - -  Moving slowly or fidgety/restless 1 - -  Suicidal thoughts 0 - -  PHQ-9 Score 20 - -  Difficult doing work/chores Somewhat difficult - -   No flowsheet data found.    Past Medical History:  Diagnosis Date  . Allergy   . Anxiety   . Asthma   . Colon polyps   . Constipation   . COPD (chronic obstructive pulmonary disease) ([place])   . Depression   .  GERD (gastroesophageal reflux disease)   . Glaucoma   . Heart attack ([place])   . Hypertension   . Sleep apnea   . Sleep apnea   . Stroke ([place])   . Urinary incontinence    Past Surgical History:  Procedure Laterality Date  . APPENDECTOMY    . CHOLECYSTECTOMY    . COLONOSCOPY WITH PROPOFOL N/A 07/20/2018   Procedure: COLONOSCOPY WITH PROPOFOL;  Surgeon: Toledo, Benay Pike, MD;  Location: [facility] ENDOSCOPY;  Service: Gastroenterology;  Laterality: N/A;  . ESOPHAGOGASTRODUODENOSCOPY N/A 07/20/2018   Procedure: ESOPHAGOGASTRODUODENOSCOPY (EGD);  Surgeon: Toledo, Benay Pike, MD;  Location: [facility] ENDOSCOPY;  Service: Gastroenterology;  Laterality: N/A;  . GASTRECTOMY    . TUBAL LIGATION    . VAGINAL HYSTERECTOMY     Social History   Socioeconomic History  . Marital status: Legally Separated    Spouse name: Not on file  .  Number of children: Not on file  . Years of education: Not on file  . Highest education level: Not on file  Occupational History  . Not on file  Tobacco Use  . Smoking status: Former Research scientist (life sciences)  . Smokeless tobacco: Never Used  . Tobacco comment: 4 months ago  Vaping Use  . Vaping Use: Some days  Substance and Sexual Activity  . Alcohol use: Not Currently    Comment: monthly or less   . Drug use: No  . Sexual activity: Not on file  Other Topics Concern  . Not on file  Social History Narrative  . Not on file   Social Determinants of Health   Financial Resource Strain: Not on file  Food Insecurity: Not on file  Transportation Needs: Not on file  Physical Activity: Not on file  Stress: Not on file  Social Connections: Not on file  Intimate Partner Violence: Not on file   Family History  Problem Relation Age of Onset  . Hypertension Mother   . Atrial fibrillation Mother   . Heart attack Mother   . Hypertension Father   . Stomach cancer Father   . Diabetes Father   . Liver cancer Father   . Diabetes Sister   . Hypertension Sister    Current Outpatient Medications on File Prior to Visit  Medication Sig  . albuterol (VENTOLIN HFA) 108 (90 Base) MCG/ACT inhaler Inhale 2-4 puffs by mouth every 4 hours as needed for wheezing, cough, and/or shortness of breath  . ALPRAZolam (XANAX) 0.5 MG tablet Take 0.5-1 mg by mouth See admin instructions. Take 1 tablet (0.5mg ) by mouth twice daily and 2 tablets (1mg ) by mouth at bedtime  . amLODipine (NORVASC) 5 MG tablet Take 1 tablet (5 mg total) by mouth daily.  . bisoprolol (ZEBETA) 10 MG tablet Take 1 tablet (10 mg total) by mouth daily.  Marland Kitchen buPROPion (WELLBUTRIN XL) 300 MG 24 hr tablet Take 1 tablet (300 mg total) by mouth daily.  . busPIRone (BUSPAR) 15 MG tablet Take 15 mg by mouth 2 (two) times daily.   . dapagliflozin propanediol (FARXIGA) 5 MG TABS tablet Take 1 tablet (5 mg total) by mouth daily.  . Dulaglutide (TRULICITY) 1.5  WU/9.8JX SOPN inject 1.5 mg once weekly for diabetes  . DULoxetine (CYMBALTA) 60 MG capsule Take 60 mg by mouth daily.   . famotidine (PEPCID) 20 MG tablet Take 20 mg by mouth 2 (two) times daily.  . fluticasone (FLONASE) 50 MCG/ACT nasal spray ONE SPRAY IN EACH NOSTRIL TWICE DAILY.  Marland Kitchen Fluticasone-Salmeterol (WIXELA INHUB) 250-50 MCG/DOSE AEPB INHALE  1 PUFF TWICE DAILY. RINSE MOUTH AFTER USE.  . gabapentin (NEURONTIN) 600 MG tablet Take 300 mg in the morning and 600 mg at night  . GLOBAL EASE INJECT PEN NEEDLES 32G X 4 MM MISC Use as directed with insulin  . Insulin Glargine (BASAGLAR KWIKPEN) 100 UNIT/ML Inject 32 Units into the skin daily.  . insulin lispro (HUMALOG) 100 UNIT/ML injection Inject 0.12 mLs (12 Units total) into the skin 3 (three) times daily before meals.  . Insulin Pen Needle (BD PEN NEEDLE NANO U/F) 32G X 4 MM MISC PT changed PCP so no longer our PT  . LATUDA 40 MG TABS tablet Take 40 mg by mouth at bedtime.  Marland Kitchen linaclotide (LINZESS) 290 MCG CAPS capsule Take 1 capsule (290 mcg total) by mouth daily before breakfast.  . meloxicam (MOBIC) 15 MG tablet Take 1 tablet (15 mg total) by mouth at bedtime.  . montelukast (SINGULAIR) 10 MG tablet Take 1 tablet (10 mg total) by mouth daily.  . Olopatadine HCl 0.2 % SOLN Place 1 drop into both eyes daily.  . rosuvastatin (CRESTOR) 10 MG tablet Take 1 tablet (10 mg total) by mouth daily.  . TRAVATAN Z 0.004 % SOLN ophthalmic solution Place 1 drop into both eyes at bedtime.   No current facility-administered medications on file prior to visit.    Review of Systems Per HPI unless specifically indicated above      Objective:    BP 134/72   Pulse 80   Temp 97.7 F (36.5 C) (Temporal)   Ht 5\' 4"  (1.626 m)   Wt 221 lb 9.6 oz (100.5 kg)   SpO2 100%   BMI 38.04 kg/m   Wt Readings from Last 3 Encounters:  12/17/20 221 lb 9.6 oz (100.5 kg)  10/21/20 219 lb (99.3 kg)  10/08/20 218 lb (98.9 kg)    Physical Exam Vitals and nursing  note reviewed.  Constitutional:      General: She is not in acute distress.    Appearance: She is well-developed and well-nourished. She is obese. She is not diaphoretic.     Comments: Well-appearing, comfortable, cooperative  HENT:     Head: Normocephalic and atraumatic.     Mouth/Throat:     Mouth: Oropharynx is clear and moist.  Eyes:     General:        Right eye: No discharge.        Left eye: No discharge.     Conjunctiva/sclera: Conjunctivae normal.  Neck:     Thyroid: No thyromegaly.  Cardiovascular:     Rate and Rhythm: Normal rate and regular rhythm.     Pulses: Intact distal pulses.     Heart sounds: Normal heart sounds. No murmur heard.   Pulmonary:     Effort: Pulmonary effort is normal. No respiratory distress.     Breath sounds: Normal breath sounds. No wheezing or rales.  Musculoskeletal:        General: No edema.     Cervical back: Normal range of motion and neck supple.     Comments: Uses cane with 4 prong feet  Lymphadenopathy:     Cervical: No cervical adenopathy.  Skin:    General: Skin is warm and dry.     Findings: No erythema or rash.  Neurological:     Mental Status: She is alert and oriented to person, place, and time.  Psychiatric:        Mood and Affect: Mood and affect normal.  Behavior: Behavior normal.     Comments: Well groomed, good eye contact, normal speech and thoughts      Diabetic Foot Exam - Simple   Simple Foot Form Diabetic Foot exam was performed with the following findings: Yes 12/17/2020  1:58 PM  Visual Inspection See comments: Yes Sensation Testing See comments: Yes Pulse Check Posterior Tibialis and Dorsalis pulse intact bilaterally: Yes Comments Mild callus formation heels. Some slight dry skin. No focal ulceration. Mild reduced monofilament sensation great toe otherwise intact.     Results for orders placed or performed in visit on 12/17/20  POCT UA - Microalbumin  Result Value Ref Range   Microalbumin  Ur, POC 0 mg/L      Assessment & Plan:   Problem List Items Addressed This Visit    Uncontrolled type 2 diabetes mellitus with hyperglycemia ([place]) - Primary    Last A1c 8.5 Upcoming visit with [facility] Endocrinology for DM Management Re order testing supplies Accuchek lancets / test strip      Relevant Medications   ACCU-CHEK GUIDE test strip   Accu-Chek FastClix Lancets MISC   Other Relevant Orders   POCT UA - Microalbumin (Completed)   OSA (obstructive sleep apnea)    Known OSA prior chronic problem Off CPAP will need new sleep study and orders Persistent clinical concern for obstructive sleep apnea given reported symptoms with witnessed apnea, snoring and sleep disturbance, fatigue excessive sleepiness. - Screening: ESS score 13 / STOP-Bang Score 6 - Neck Circumference: < 16" - Co-morbidities: HTN, Mood Disorder  Plan: 1. Discussion on diagnosis and testing for OSA, risk factors, management, complications 2. Agree to proceed with sleep study testing based on clinical history - will fax referral to [place] - to arrange initial PSG home vs in sleep lab and further evaluation.       Major depressive disorder, recurrent, moderate (HCC)    Chronic problem Relatively stable, still elevated scores On med management and followed by Rachel Kasandra Knudsen - [facility]      GAD (generalized anxiety disorder)   Diabetic neuropathy ([place])    Complication with V6XI DM Foot exam today On Gabapentin, Duloxetine      Centrilobular emphysema ([place])    Followed by Rachel Minerva [facility]m On management           Meds ordered this encounter  Medications  . ACCU-CHEK GUIDE test strip    Sig: Use to check blood sugar up to twice a day as directed    Dispense:  100 strip    Refill:  5    E11.65  . Accu-Chek FastClix Lancets MISC    Sig: Use to check blood sugar up to twice a day as directed    Dispense:  102 each    Refill:  5    E11.65      Follow up plan: Return in  about 3 months (around 03/16/2021) for 3 month follow-up Diabetes, Neurology, Sleep study.  Nobie Putnam, DO [facility] 12/17/2020, 1:43 PM

## 2020-12-17 NOTE — Patient Instructions (Addendum)
Thank you for coming to the office today.  Sleep study to [place] - stay tuned for apt.  Refilled diabetic supplies.  Please schedule a Follow-up Appointment to: Return in about 3 months (around 03/16/2021) for 3 month follow-up Diabetes, Neurology, Sleep study.  If you have any other questions or concerns, please feel free to call the office or send a message through [place]. You may also schedule an earlier appointment if necessary.  Additionally, you may be receiving a survey about your experience at our office within a few days to 1 week by e-mail or mail. We value your feedback.  Nobie Putnam, DO [place]

## 2020-12-18 DIAGNOSIS — G4733 Obstructive sleep apnea (adult) (pediatric): Secondary | ICD-10-CM | POA: Insufficient documentation

## 2020-12-18 LAB — POCT UA - MICROALBUMIN: Microalbumin Ur, POC: 0 mg/L

## 2020-12-18 NOTE — Assessment & Plan Note (Signed)
Followed by Dr Durenda Guthrie On management

## 2020-12-18 NOTE — Assessment & Plan Note (Signed)
Known OSA prior chronic problem Off CPAP will need new sleep study and orders Persistent clinical concern for obstructive sleep apnea given reported symptoms with witnessed apnea, snoring and sleep disturbance, fatigue excessive sleepiness. - Screening: ESS score 13 / STOP-Bang Score 6 - Neck Circumference: < 16" - Co-morbidities: HTN, Mood Disorder  Plan: 1. Discussion on diagnosis and testing for OSA, risk factors, management, complications 2. Agree to proceed with sleep study testing based on clinical history - will fax referral to [place] - to arrange initial PSG home vs in sleep lab and further evaluation.

## 2020-12-18 NOTE — Assessment & Plan Note (Signed)
Chronic problem Relatively stable, still elevated scores On med management and followed by Dr Kasandra Knudsen [facility]

## 2020-12-18 NOTE — Assessment & Plan Note (Signed)
Complication with [id number] DM Foot exam today On Gabapentin, Duloxetine

## 2020-12-18 NOTE — Assessment & Plan Note (Signed)
Last A1c 8.5 Upcoming visit with [facility] Endocrinology for DM Management Re order testing supplies Accuchek lancets / test strip

## 2020-12-23 DIAGNOSIS — I10 Essential (primary) hypertension: Secondary | ICD-10-CM | POA: Diagnosis not present

## 2020-12-23 DIAGNOSIS — E119 Type 2 diabetes mellitus without complications: Secondary | ICD-10-CM | POA: Diagnosis not present

## 2020-12-23 DIAGNOSIS — Z794 Long term (current) use of insulin: Secondary | ICD-10-CM | POA: Diagnosis not present

## 2020-12-23 DIAGNOSIS — E782 Mixed hyperlipidemia: Secondary | ICD-10-CM | POA: Diagnosis not present

## 2020-12-23 DIAGNOSIS — E669 Obesity, unspecified: Secondary | ICD-10-CM | POA: Diagnosis not present

## 2020-12-25 DIAGNOSIS — R942 Abnormal results of pulmonary function studies: Secondary | ICD-10-CM | POA: Diagnosis not present

## 2020-12-25 DIAGNOSIS — R0600 Dyspnea, unspecified: Secondary | ICD-10-CM | POA: Diagnosis not present

## 2020-12-25 DIAGNOSIS — J4520 Mild intermittent asthma, uncomplicated: Secondary | ICD-10-CM | POA: Diagnosis not present

## 2020-12-26 ENCOUNTER — Other Ambulatory Visit: Payer: Self-pay

## 2020-12-26 ENCOUNTER — Ambulatory Visit
Admission: RE | Admit: 2020-12-26 | Discharge: 2020-12-26 | Disposition: A | Discharge status: Home / Self Care | Payer: Medicare HMO | Source: Ambulatory Visit | Attending: Neurology | Admitting: Neurology

## 2020-12-26 DIAGNOSIS — S0990XA Unspecified injury of head, initial encounter: Secondary | ICD-10-CM | POA: Diagnosis not present

## 2020-12-26 DIAGNOSIS — R531 Weakness: Secondary | ICD-10-CM | POA: Diagnosis not present

## 2020-12-30 ENCOUNTER — Other Ambulatory Visit: Payer: Self-pay

## 2020-12-30 DIAGNOSIS — R531 Weakness: Secondary | ICD-10-CM | POA: Diagnosis not present

## 2020-12-30 DIAGNOSIS — R2689 Other abnormalities of gait and mobility: Secondary | ICD-10-CM | POA: Diagnosis not present

## 2020-12-30 DIAGNOSIS — E1165 Type 2 diabetes mellitus with hyperglycemia: Secondary | ICD-10-CM

## 2020-12-31 ENCOUNTER — Ambulatory Visit (INDEPENDENT_AMBULATORY_CARE_PROVIDER_SITE_OTHER): Payer: Medicare HMO

## 2020-12-31 ENCOUNTER — Other Ambulatory Visit: Payer: Self-pay | Admitting: Family Medicine

## 2020-12-31 VITALS — Ht 64.0 in | Wt 223.0 lb

## 2020-12-31 DIAGNOSIS — E1165 Type 2 diabetes mellitus with hyperglycemia: Secondary | ICD-10-CM | POA: Diagnosis not present

## 2020-12-31 DIAGNOSIS — R0602 Shortness of breath: Secondary | ICD-10-CM | POA: Diagnosis not present

## 2020-12-31 DIAGNOSIS — Z0000 Encounter for general adult medical examination without abnormal findings: Secondary | ICD-10-CM

## 2020-12-31 DIAGNOSIS — K5900 Constipation, unspecified: Secondary | ICD-10-CM

## 2020-12-31 DIAGNOSIS — G459 Transient cerebral ischemic attack, unspecified: Secondary | ICD-10-CM | POA: Diagnosis not present

## 2020-12-31 DIAGNOSIS — I10 Essential (primary) hypertension: Secondary | ICD-10-CM

## 2020-12-31 MED ORDER — INSULIN GLARGINE 100 UNIT/ML ~~LOC~~ SOLN: 32.0000 [IU] | Freq: Every day | SUBCUTANEOUS | 2 refills | Status: DC

## 2020-12-31 MED ORDER — BASAGLAR KWIKPEN 100 UNIT/ML ~~LOC~~ SOPN: 32.0000 [IU] | PEN_INJECTOR | Freq: Every day | SUBCUTANEOUS | 0 refills | Status: DC

## 2020-12-31 NOTE — Patient Instructions (Signed)
Rachel Nielsen , Thank you for taking time to come for your Medicare Wellness Visit. I appreciate your ongoing commitment to your health goals. Please review the following plan we discussed and let me know if I can assist you in the future.   Screening recommendations/referrals: Colonoscopy: patient to reschedule Mammogram: completed 04/16/2020 Bone Density: completed 03/13/2009 Recommended yearly ophthalmology/optometry visit for glaucoma screening and checkup Recommended yearly dental visit for hygiene and checkup  Vaccinations: Influenza vaccine: completed 10/21/2020, due 06/02/2021 Pneumococcal vaccine: due Tdap vaccine: due Shingles vaccine: discussed   Covid-19: decline  Advanced directives: Advance directive discussed with you today.  Conditions/risks identified: none  Next appointment: Follow up in one year for your annual wellness visit    Preventive Care 65 Years and Older, Female Preventive care refers to lifestyle choices and visits with your health care provider that can promote health and wellness. What does preventive care include?  A yearly physical exam. This is also called an annual well check.  Dental exams once or twice a year.  Routine eye exams. Ask your health care provider how often you should have your eyes checked.  Personal lifestyle choices, including:  Daily care of your teeth and gums.  Regular physical activity.  Eating a healthy diet.  Avoiding tobacco and drug use.  Limiting alcohol use.  Practicing safe sex.  Taking low-dose aspirin every day.  Taking vitamin and mineral supplements as recommended by your health care provider. What happens during an annual well check? The services and screenings done by your health care provider during your annual well check will depend on your age, overall health, lifestyle risk factors, and family history of disease. Counseling  Your health care provider may ask you questions about your:  Alcohol  use.  Tobacco use.  Drug use.  Emotional well-being.  Home and relationship well-being.  Sexual activity.  Eating habits.  History of falls.  Memory and ability to understand (cognition).  Work and work Statistician.  Reproductive health. Screening  You may have the following tests or measurements:  Height, weight, and BMI.  Blood pressure.  Lipid and cholesterol levels. These may be checked every 5 years, or more frequently if you are over 29 years old.  Skin check.  Lung cancer screening. You may have this screening every year starting at age 3 if you have a 30-pack-year history of smoking and currently smoke or have quit within the past 15 years.  Fecal occult blood test (FOBT) of the stool. You may have this test every year starting at age 76.  Flexible sigmoidoscopy or colonoscopy. You may have a sigmoidoscopy every 5 years or a colonoscopy every 10 years starting at age 52.  Hepatitis C blood test.  Hepatitis B blood test.  Sexually transmitted disease (STD) testing.  Diabetes screening. This is done by checking your blood sugar (glucose) after you have not eaten for a while (fasting). You may have this done every 1-3 years.  Bone density scan. This is done to screen for osteoporosis. You may have this done starting at age 69.  Mammogram. This may be done every 1-2 years. Talk to your health care provider about how often you should have regular mammograms. Talk with your health care provider about your test results, treatment options, and if necessary, the need for more tests. Vaccines  Your health care provider may recommend certain vaccines, such as:  Influenza vaccine. This is recommended every year.  Tetanus, diphtheria, and acellular pertussis (Tdap, Td) vaccine. You may  need a Td booster every 10 years.  Zoster vaccine. You may need this after age 23.  Pneumococcal 13-valent conjugate (PCV13) vaccine. One dose is recommended after age  10.  Pneumococcal polysaccharide (PPSV23) vaccine. One dose is recommended after age 44. Talk to your health care provider about which screenings and vaccines you need and how often you need them. This information is not intended to replace advice given to you by your health care provider. Make sure you discuss any questions you have with your health care provider. Document Released: 11/15/2015 Document Revised: 07/08/2016 Document Reviewed: 08/20/2015 Elsevier Interactive Patient Education  2017 [employer] Prevention in the Home Falls can cause injuries. They can happen to people of all ages. There are many things you can do to make your home safe and to help prevent falls. What can I do on the outside of my home?  Regularly fix the edges of walkways and driveways and fix any cracks.  Remove anything that might make you trip as you walk through a door, such as a raised step or threshold.  Trim any bushes or trees on the path to your home.  Use bright outdoor lighting.  Clear any walking paths of anything that might make someone trip, such as rocks or tools.  Regularly check to see if handrails are loose or broken. Make sure that both sides of any steps have handrails.  Any raised decks and porches should have guardrails on the edges.  Have any leaves, snow, or ice cleared regularly.  Use sand or salt on walking paths during winter.  Clean up any spills in your garage right away. This includes oil or grease spills. What can I do in the bathroom?  Use night lights.  Install grab bars by the toilet and in the tub and shower. Do not use towel bars as grab bars.  Use non-skid mats or decals in the tub or shower.  If you need to sit down in the shower, use a plastic, non-slip stool.  Keep the floor dry. Clean up any water that spills on the floor as soon as it happens.  Remove soap buildup in the tub or shower regularly.  Attach bath mats securely with double-sided  non-slip rug tape.  Do not have throw rugs and other things on the floor that can make you trip. What can I do in the bedroom?  Use night lights.  Make sure that you have a light by your bed that is easy to reach.  Do not use any sheets or blankets that are too big for your bed. They should not hang down onto the floor.  Have a firm chair that has side arms. You can use this for support while you get dressed.  Do not have throw rugs and other things on the floor that can make you trip. What can I do in the kitchen?  Clean up any spills right away.  Avoid walking on wet floors.  Keep items that you use a lot in easy-to-reach places.  If you need to reach something above you, use a strong step stool that has a grab bar.  Keep electrical cords out of the way.  Do not use floor polish or wax that makes floors slippery. If you must use wax, use non-skid floor wax.  Do not have throw rugs and other things on the floor that can make you trip. What can I do with my stairs?  Do not leave any items on  the stairs.  Make sure that there are handrails on both sides of the stairs and use them. Fix handrails that are broken or loose. Make sure that handrails are as long as the stairways.  Check any carpeting to make sure that it is firmly attached to the stairs. Fix any carpet that is loose or worn.  Avoid having throw rugs at the top or bottom of the stairs. If you do have throw rugs, attach them to the floor with carpet tape.  Make sure that you have a light switch at the top of the stairs and the bottom of the stairs. If you do not have them, ask someone to add them for you. What else can I do to help prevent falls?  Wear shoes that:  Do not have high heels.  Have rubber bottoms.  Are comfortable and fit you well.  Are closed at the toe. Do not wear sandals.  If you use a stepladder:  Make sure that it is fully opened. Do not climb a closed stepladder.  Make sure that both  sides of the stepladder are locked into place.  Ask someone to hold it for you, if possible.  Clearly mark and make sure that you can see:  Any grab bars or handrails.  First and last steps.  Where the edge of each step is.  Use tools that help you move around (mobility aids) if they are needed. These include:  Canes.  Walkers.  Scooters.  Crutches.  Turn on the lights when you go into a dark area. Replace any light bulbs as soon as they burn out.  Set up your furniture so you have a clear path. Avoid moving your furniture around.  If any of your floors are uneven, fix them.  If there are any pets around you, be aware of where they are.  Review your medicines with your doctor. Some medicines can make you feel dizzy. This can increase your chance of falling. Ask your doctor what other things that you can do to help prevent falls. This information is not intended to replace advice given to you by your health care provider. Make sure you discuss any questions you have with your health care provider. Document Released: 08/15/2009 Document Revised: 03/26/2016 Document Reviewed: 11/23/2014 Elsevier Interactive Patient Education  2017 [employer].

## 2020-12-31 NOTE — Progress Notes (Signed)
I connected with Rachel Nielsen today by telephone and verified that I am speaking with the correct person using two identifiers. Location patient: home Location provider: work Persons participating in the virtual visit: Rachel Nielsen, Glenna Durand LPN.   I discussed the limitations, risks, security and privacy concerns of performing an evaluation and management service by telephone and the availability of in person appointments. I also discussed with the patient that there may be a patient responsible charge related to this service. The patient expressed understanding and verbally consented to this telephonic visit.    Interactive audio and video telecommunications were attempted between this provider and patient, however failed, due to patient having technical difficulties OR patient did not have access to video capability.  We continued and completed visit with audio only.     Vital signs may be patient reported or missing.  Subjective:   Rachel Nielsen is a 70 y.o. female who presents for Medicare Annual (Subsequent) preventive examination.  Review of Systems     Cardiac Risk Factors include: advanced age (>35men, >36 women);diabetes mellitus;hypertension;obesity (BMI >30kg/m2);sedentary lifestyle     Objective:    Today's Vitals   12/31/20 0816 12/31/20 0817  Weight: 223 lb (101.2 kg)   Height: 5\' 4"  (1.626 m)   PainSc:  3    Body mass index is 38.28 kg/m.  Advanced Directives 12/31/2020 10/08/2020 09/21/2020 08/10/2020 08/03/2020 07/20/2018  Does Patient Have a Medical Advance Directive? No No No No No No  Would patient like information on creating a medical advance directive? - No - Patient declined - No - Patient declined - No - Patient declined    Current Medications (verified) Outpatient Encounter Medications as of 12/31/2020  Medication Sig  . Accu-Chek FastClix Lancets MISC Use to check blood sugar up to twice a day as directed  . ACCU-CHEK GUIDE test strip Use to check  blood sugar up to twice a day as directed  . albuterol (VENTOLIN HFA) 108 (90 Base) MCG/ACT inhaler Inhale 2-4 puffs by mouth every 4 hours as needed for wheezing, cough, and/or shortness of breath  . ALPRAZolam (XANAX) 0.5 MG tablet Take 0.5-1 mg by mouth See admin instructions. Take 1 tablet (0.5mg ) by mouth twice daily and 2 tablets (1mg ) by mouth at bedtime  . amLODipine (NORVASC) 5 MG tablet Take 1 tablet (5 mg total) by mouth daily.  . bisoprolol (ZEBETA) 10 MG tablet Take 1 tablet (10 mg total) by mouth daily.  Marland Kitchen buPROPion (WELLBUTRIN XL) 300 MG 24 hr tablet Take 1 tablet (300 mg total) by mouth daily.  . busPIRone (BUSPAR) 15 MG tablet Take 15 mg by mouth 2 (two) times daily.   . dapagliflozin propanediol (FARXIGA) 5 MG TABS tablet Take 1 tablet (5 mg total) by mouth daily.  . Dulaglutide (TRULICITY) 1.5 JE/5.6DJ SOPN inject 1.5 mg once weekly for diabetes  . DULoxetine (CYMBALTA) 60 MG capsule Take 60 mg by mouth daily.   . famotidine (PEPCID) 20 MG tablet Take 20 mg by mouth 2 (two) times daily.  . fluticasone (FLONASE) 50 MCG/ACT nasal spray ONE SPRAY IN EACH NOSTRIL TWICE DAILY.  Marland Kitchen gabapentin (NEURONTIN) 600 MG tablet Take 300 mg in the morning and 600 mg at night  . GLOBAL EASE INJECT PEN NEEDLES 32G X 4 MM MISC Use as directed with insulin  . Insulin Glargine (BASAGLAR KWIKPEN) 100 UNIT/ML Inject 32 Units into the skin daily.  . insulin lispro (HUMALOG) 100 UNIT/ML injection Inject 0.12 mLs (12 Units total) into  the skin 3 (three) times daily before meals.  . Insulin Pen Needle (BD PEN NEEDLE NANO U/F) 32G X 4 MM MISC PT changed PCP so no longer our PT  . LATUDA 40 MG TABS tablet Take 40 mg by mouth at bedtime.  Marland Kitchen linaclotide (LINZESS) 290 MCG CAPS capsule Take 1 capsule (290 mcg total) by mouth daily before breakfast.  . meloxicam (MOBIC) 15 MG tablet Take 1 tablet (15 mg total) by mouth at bedtime.  . montelukast (SINGULAIR) 10 MG tablet Take 1 tablet (10 mg total) by mouth daily.   . Olopatadine HCl 0.2 % SOLN Place 1 drop into both eyes daily.  . rosuvastatin (CRESTOR) 10 MG tablet Take 1 tablet (10 mg total) by mouth daily.  . TRAVATAN Z 0.004 % SOLN ophthalmic solution Place 1 drop into both eyes at bedtime.  . Fluticasone-Salmeterol (WIXELA INHUB) 250-50 MCG/DOSE AEPB INHALE 1 PUFF TWICE DAILY. RINSE MOUTH AFTER USE. (Patient not taking: Reported on 12/31/2020)   No facility-administered encounter medications on file as of 12/31/2020.    Allergies (verified) Ace inhibitors   History: Past Medical History:  Diagnosis Date  . Allergy   . Anxiety   . Asthma   . Colon polyps   . Constipation   . COPD (chronic obstructive pulmonary disease) ([place])   . Depression   . GERD (gastroesophageal reflux disease)   . Glaucoma   . Heart attack ([place])   . Hypertension   . Sleep apnea   . Sleep apnea   . Stroke ([place])   . Urinary incontinence    Past Surgical History:  Procedure Laterality Date  . APPENDECTOMY    . CHOLECYSTECTOMY    . COLONOSCOPY WITH PROPOFOL N/A 07/20/2018   Procedure: COLONOSCOPY WITH PROPOFOL;  Surgeon: Toledo, Benay Pike, MD;  Location: [facility] ENDOSCOPY;  Service: Gastroenterology;  Laterality: N/A;  . ESOPHAGOGASTRODUODENOSCOPY N/A 07/20/2018   Procedure: ESOPHAGOGASTRODUODENOSCOPY (EGD);  Surgeon: Toledo, Benay Pike, MD;  Location: [facility] ENDOSCOPY;  Service: Gastroenterology;  Laterality: N/A;  . GASTRECTOMY    . TUBAL LIGATION    . VAGINAL HYSTERECTOMY     Family History  Problem Relation Age of Onset  . Hypertension Mother   . Atrial fibrillation Mother   . Heart attack Mother   . Hypertension Father   . Stomach cancer Father   . Diabetes Father   . Liver cancer Father   . Diabetes Sister   . Hypertension Sister    Social History   Socioeconomic History  . Marital status: Legally Separated    Spouse name: Not on file  . Number of children: Not on file  . Years of education: Not on file  . Highest education level: Not on file   Occupational History  . Occupation: retired  Tobacco Use  . Smoking status: Former Research scientist (life sciences)  . Smokeless tobacco: Never Used  . Tobacco comment: 4 months ago  Vaping Use  . Vaping Use: Some days  Substance and Sexual Activity  . Alcohol use: Not Currently    Comment: monthly or less   . Drug use: No  . Sexual activity: Not on file  Other Topics Concern  . Not on file  Social History Narrative  . Not on file   Social Determinants of Health   Financial Resource Strain: Low Risk   . Difficulty of Paying Living Expenses: Not hard at all  Food Insecurity: No Food Insecurity  . Worried About Charity fundraiser in the Last Year: Never true  .  Ran Out of Food in the Last Year: Never true  Transportation Needs: No Transportation Needs  . Lack of Transportation (Medical): No  . Lack of Transportation (Non-Medical): No  Physical Activity: Inactive  . Days of Exercise per Week: 0 days  . Minutes of Exercise per Session: 0 min  Stress: No Stress Concern Present  . Feeling of Stress : Not at all  Social Connections: Not on file    Tobacco Counseling Counseling given: Not Answered Comment: 4 months ago   Clinical Intake:  Pre-visit preparation completed: Yes  Pain : 0-10 Pain Score: 3  Pain Type: Chronic pain Pain Location: Neck Pain Descriptors / Indicators: Dull Pain Onset: More than a month ago Pain Frequency: Constant     Nutritional Status: BMI > 30  Obese Nutritional Risks: None Diabetes: Yes  How often do you need to have someone help you when you read instructions, pamphlets, or other written materials from your doctor or pharmacy?: 1 - Never What is the last grade level you completed in school?: college  Diabetic? Yes Nutrition Risk Assessment:  Has the patient had any N/V/D within the last 2 months?  No  Does the patient have any non-healing wounds?  No  Has the patient had any unintentional weight loss or weight gain?  No   Diabetes:  Is the patient  diabetic?  Yes  If diabetic, was a CBG obtained today?  No  Did the patient bring in their glucometer from home?  No  How often do you monitor your CBG's? Twice daily.   Financial Strains and Diabetes Management:  Are you having any financial strains with the device, your supplies or your medication? No .  Does the patient want to be seen by Chronic Care Management for management of their diabetes?  No  Would the patient like to be referred to a Nutritionist or for Diabetic Management?  No   Diabetic Exams:  Diabetic Eye Exam: Overdue for diabetic eye exam. Pt has been advised about the importance in completing this exam. Patient advised to call and schedule an eye exam. Diabetic Foot Exam: Completed 12/17/2020   Interpreter Needed?: No  Information entered by :: NAllen LPN   Activities of Daily Living In your present state of health, do you have any difficulty performing the following activities: 12/31/2020  Hearing? N  Vision? Y  Comment blurry every now and then  Difficulty concentrating or making decisions? Y  Comment memory  Walking or climbing stairs? Y  Dressing or bathing? Y  Doing errands, shopping? Y  Comment does not leave house  Preparing Food and eating ? Y  Comment son prepares meals  Using the Toilet? N  In the past six months, have you accidently leaked urine? Y  Do you have problems with loss of bowel control? Y  Managing your Medications? N  Managing your Finances? N  Housekeeping or managing your Housekeeping? Y  Some recent data might be hidden    Patient Care Team: Malfi, Lupita Raider, FNP as PCP - General (Family Medicine)  Indicate any recent Medical Services you may have received from other than [facility] providers in the past year (date may be approximate).     Assessment:   This is a routine wellness examination for Rachel Nielsen.  Hearing/Vision screen  Hearing Screening   125Hz  250Hz  500Hz  1000Hz  2000Hz  3000Hz  4000Hz  6000Hz  8000Hz   Right ear:            Left ear:  Vision Screening Comments: No regular eye exams, [facility]  Dietary issues and exercise activities discussed: Current Exercise Habits: The patient does not participate in regular exercise at present  Goals    . Patient Stated     12/31/2020, want to get health issues under control      Depression Screen PHQ 2/9 Scores 12/31/2020 12/17/2020 11/07/2019 11/07/2019 06/07/2019 03/14/2019 01/03/2019  PHQ - 2 Score 6 6 1 1 2  0 0  PHQ- 9 Score 20 20 - - 10 - -    Fall Risk Fall Risk  12/31/2020 11/07/2019 11/07/2019 06/07/2019 03/14/2019  Falls in the past year? 1 1 1 1 1   Comment just falls over - - - -  Number falls in past yr: 1 1 1  0 1  Injury with Fall? 1 - - 0 1  Comment hurt neck - - - pt seen at ER due to black out, mild heart attack  Risk for fall due to : Impaired balance/gait;History of fall(s);Impaired mobility;Medication side effect - - - -  Follow up Falls evaluation completed;Education provided;Falls prevention discussed - - - -    FALL RISK PREVENTION PERTAINING TO THE HOME:  Any stairs in or around the home? Yes  If so, are there any without handrails? Yes  Home free of loose throw rugs in walkways, pet beds, electrical cords, etc? Yes  Adequate lighting in your home to reduce risk of falls? Yes   ASSISTIVE DEVICES UTILIZED TO PREVENT FALLS:  Life alert? Yes  Use of a cane, walker or w/c? Yes  Grab bars in the bathroom? No  Shower chair or bench in shower? Yes  Elevated toilet seat or a handicapped toilet? No   TIMED UP AND GO:  Was the test performed? No .     Cognitive Function: MMSE - Mini Mental State Exam 11/07/2019 09/22/2018  Orientation to time 5 5  Orientation to Place 5 5  Registration 3 3  Attention/ Calculation 5 5  Recall 3 3  Language- name 2 objects 2 2  Language- repeat 1 1  Language- follow 3 step command 3 3  Language- read & follow direction 1 1  Write a sentence 0 1  Copy design 1 1  Total score 29 30     6CIT Screen  12/31/2020  What Year? 0 points  What month? 0 points  What time? 0 points  Count back from 20 0 points  Months in reverse 0 points  Repeat phrase 4 points  Total Score 4    Immunizations Immunization History  Administered Date(s) Administered  . Fluad Quad(high Dose 65+) 10/21/2020  . Influenza Inj Mdck Quad Pf 11/07/2019  . Influenza-Unspecified 08/23/2017    TDAP status: Due, Education has been provided regarding the importance of this vaccine. Advised may receive this vaccine at local pharmacy or Health Dept. Aware to provide a copy of the vaccination record if obtained from local pharmacy or Health Dept. Verbalized acceptance and understanding.  Flu Vaccine status: Up to date  Pneumococcal vaccine status: Due, Education has been provided regarding the importance of this vaccine. Advised may receive this vaccine at local pharmacy or Health Dept. Aware to provide a copy of the vaccination record if obtained from local pharmacy or Health Dept. Verbalized acceptance and understanding.  Covid-19 vaccine status: Declined, Education has been provided regarding the importance of this vaccine but patient still declined. Advised may receive this vaccine at local pharmacy or Health Dept.or vaccine clinic. Aware to provide a  copy of the vaccination record if obtained from local pharmacy or Health Dept. Verbalized acceptance and understanding.  Qualifies for Shingles Vaccine? Yes   Zostavax completed No   Shingrix Completed?: No.    Education has been provided regarding the importance of this vaccine. Patient has been advised to call insurance company to determine out of pocket expense if they have not yet received this vaccine. Advised may also receive vaccine at local pharmacy or Health Dept. Verbalized acceptance and understanding.  Screening Tests Health Maintenance  Topic Date Due  . COVID-19 Vaccine (1) Never done  . TETANUS/TDAP  Never done  . PNA vac Low Risk Adult (1 of 2 - PCV13)  Never done  . OPHTHALMOLOGY EXAM  07/24/2019  . Hepatitis C Screening  12/17/2021 (Originally 1951-08-23)  . HEMOGLOBIN A1C  04/21/2021  . FOOT EXAM  12/17/2021  . URINE MICROALBUMIN  12/18/2021  . MAMMOGRAM  04/16/2022  . COLONOSCOPY (Pts 45-59yrs Insurance coverage will need to be confirmed)  07/20/2028  . INFLUENZA VACCINE  Completed  . DEXA SCAN  Completed  . HPV VACCINES  Aged Out    Health Maintenance  Health Maintenance Due  Topic Date Due  . COVID-19 Vaccine (1) Never done  . TETANUS/TDAP  Never done  . PNA vac Low Risk Adult (1 of 2 - PCV13) Never done  . OPHTHALMOLOGY EXAM  07/24/2019    Colorectal cancer screening: Type of screening: Colonoscopy. Completed 07/20/2018. Repeat every 1 years, patient to reschedule  Mammogram status: Completed 04/16/2020. Repeat every year  Bone Density status: Completed 03/13/2009.   Lung Cancer Screening: (Low Dose CT Chest recommended if Age 57-80 years, 30 pack-year currently smoking OR have quit w/in 15years.) does not qualify.   Lung Cancer Screening Referral: no  Additional Screening:  Hepatitis C Screening: does qualify; due  Vision Screening: Recommended annual ophthalmology exams for early detection of glaucoma and other disorders of the eye. Is the patient up to date with their annual eye exam?  No  Who is the provider or what is the name of the office in which the patient attends annual eye exams? [facility] If pt is not established with a provider, would they like to be referred to a provider to establish care? No .   Dental Screening: Recommended annual dental exams for proper oral hygiene  Community Resource Referral / Chronic Care Management: CRR required this visit?  No   CCM required this visit?  No      Plan:     I have personally reviewed and noted the following in the patient's chart:   . Medical and social history . Use of alcohol, tobacco or illicit drugs  . Current medications and  supplements . Functional ability and status . Nutritional status . Physical activity . Advanced directives . List of other physicians . Hospitalizations, surgeries, and ER visits in previous 12 months . Vitals . Screenings to include cognitive, depression, and falls . Referrals and appointments  In addition, I have reviewed and discussed with patient certain preventive protocols, quality metrics, and best practice recommendations. A written personalized care plan for preventive services as well as general preventive health recommendations were provided to patient.     Kellie Simmering, LPN   06/06/8849   Nurse Notes:

## 2020-12-31 NOTE — Addendum Note (Signed)
Addended by: Olin Hauser on: 12/31/2020 12:47 PM   Modules accepted: Orders

## 2021-01-01 DIAGNOSIS — J449 Chronic obstructive pulmonary disease, unspecified: Secondary | ICD-10-CM | POA: Diagnosis not present

## 2021-01-01 DIAGNOSIS — M549 Dorsalgia, unspecified: Secondary | ICD-10-CM | POA: Diagnosis not present

## 2021-01-01 DIAGNOSIS — H409 Unspecified glaucoma: Secondary | ICD-10-CM | POA: Diagnosis not present

## 2021-01-01 DIAGNOSIS — E1140 Type 2 diabetes mellitus with diabetic neuropathy, unspecified: Secondary | ICD-10-CM | POA: Diagnosis not present

## 2021-01-01 DIAGNOSIS — H269 Unspecified cataract: Secondary | ICD-10-CM | POA: Diagnosis not present

## 2021-01-01 DIAGNOSIS — M47812 Spondylosis without myelopathy or radiculopathy, cervical region: Secondary | ICD-10-CM | POA: Diagnosis not present

## 2021-01-01 DIAGNOSIS — I69354 Hemiplegia and hemiparesis following cerebral infarction affecting left non-dominant side: Secondary | ICD-10-CM | POA: Diagnosis not present

## 2021-01-01 DIAGNOSIS — I10 Essential (primary) hypertension: Secondary | ICD-10-CM | POA: Diagnosis not present

## 2021-01-01 DIAGNOSIS — G8929 Other chronic pain: Secondary | ICD-10-CM | POA: Diagnosis not present

## 2021-01-05 DIAGNOSIS — H269 Unspecified cataract: Secondary | ICD-10-CM | POA: Diagnosis not present

## 2021-01-05 DIAGNOSIS — I69354 Hemiplegia and hemiparesis following cerebral infarction affecting left non-dominant side: Secondary | ICD-10-CM | POA: Diagnosis not present

## 2021-01-05 DIAGNOSIS — M47812 Spondylosis without myelopathy or radiculopathy, cervical region: Secondary | ICD-10-CM | POA: Diagnosis not present

## 2021-01-05 DIAGNOSIS — H409 Unspecified glaucoma: Secondary | ICD-10-CM | POA: Diagnosis not present

## 2021-01-05 DIAGNOSIS — G8929 Other chronic pain: Secondary | ICD-10-CM | POA: Diagnosis not present

## 2021-01-05 DIAGNOSIS — E1140 Type 2 diabetes mellitus with diabetic neuropathy, unspecified: Secondary | ICD-10-CM | POA: Diagnosis not present

## 2021-01-05 DIAGNOSIS — M549 Dorsalgia, unspecified: Secondary | ICD-10-CM | POA: Diagnosis not present

## 2021-01-05 DIAGNOSIS — I10 Essential (primary) hypertension: Secondary | ICD-10-CM | POA: Diagnosis not present

## 2021-01-05 DIAGNOSIS — J449 Chronic obstructive pulmonary disease, unspecified: Secondary | ICD-10-CM | POA: Diagnosis not present

## 2021-01-06 DIAGNOSIS — G8929 Other chronic pain: Secondary | ICD-10-CM | POA: Diagnosis not present

## 2021-01-06 DIAGNOSIS — H269 Unspecified cataract: Secondary | ICD-10-CM | POA: Diagnosis not present

## 2021-01-06 DIAGNOSIS — M47812 Spondylosis without myelopathy or radiculopathy, cervical region: Secondary | ICD-10-CM | POA: Diagnosis not present

## 2021-01-06 DIAGNOSIS — J449 Chronic obstructive pulmonary disease, unspecified: Secondary | ICD-10-CM | POA: Diagnosis not present

## 2021-01-06 DIAGNOSIS — E1140 Type 2 diabetes mellitus with diabetic neuropathy, unspecified: Secondary | ICD-10-CM | POA: Diagnosis not present

## 2021-01-06 DIAGNOSIS — M549 Dorsalgia, unspecified: Secondary | ICD-10-CM | POA: Diagnosis not present

## 2021-01-06 DIAGNOSIS — I10 Essential (primary) hypertension: Secondary | ICD-10-CM | POA: Diagnosis not present

## 2021-01-06 DIAGNOSIS — I69354 Hemiplegia and hemiparesis following cerebral infarction affecting left non-dominant side: Secondary | ICD-10-CM | POA: Diagnosis not present

## 2021-01-06 DIAGNOSIS — H409 Unspecified glaucoma: Secondary | ICD-10-CM | POA: Diagnosis not present

## 2021-01-08 DIAGNOSIS — F4310 Post-traumatic stress disorder, unspecified: Secondary | ICD-10-CM | POA: Diagnosis not present

## 2021-01-08 DIAGNOSIS — F3189 Other bipolar disorder: Secondary | ICD-10-CM | POA: Diagnosis not present

## 2021-01-09 DIAGNOSIS — I10 Essential (primary) hypertension: Secondary | ICD-10-CM | POA: Diagnosis not present

## 2021-01-09 DIAGNOSIS — G8929 Other chronic pain: Secondary | ICD-10-CM | POA: Diagnosis not present

## 2021-01-09 DIAGNOSIS — M47812 Spondylosis without myelopathy or radiculopathy, cervical region: Secondary | ICD-10-CM | POA: Diagnosis not present

## 2021-01-09 DIAGNOSIS — I69354 Hemiplegia and hemiparesis following cerebral infarction affecting left non-dominant side: Secondary | ICD-10-CM | POA: Diagnosis not present

## 2021-01-09 DIAGNOSIS — E1140 Type 2 diabetes mellitus with diabetic neuropathy, unspecified: Secondary | ICD-10-CM | POA: Diagnosis not present

## 2021-01-09 DIAGNOSIS — H409 Unspecified glaucoma: Secondary | ICD-10-CM | POA: Diagnosis not present

## 2021-01-09 DIAGNOSIS — M549 Dorsalgia, unspecified: Secondary | ICD-10-CM | POA: Diagnosis not present

## 2021-01-09 DIAGNOSIS — J449 Chronic obstructive pulmonary disease, unspecified: Secondary | ICD-10-CM | POA: Diagnosis not present

## 2021-01-09 DIAGNOSIS — H269 Unspecified cataract: Secondary | ICD-10-CM | POA: Diagnosis not present

## 2021-01-13 DIAGNOSIS — J449 Chronic obstructive pulmonary disease, unspecified: Secondary | ICD-10-CM | POA: Diagnosis not present

## 2021-01-13 DIAGNOSIS — I10 Essential (primary) hypertension: Secondary | ICD-10-CM | POA: Diagnosis not present

## 2021-01-13 DIAGNOSIS — E1140 Type 2 diabetes mellitus with diabetic neuropathy, unspecified: Secondary | ICD-10-CM | POA: Diagnosis not present

## 2021-01-13 DIAGNOSIS — M47812 Spondylosis without myelopathy or radiculopathy, cervical region: Secondary | ICD-10-CM | POA: Diagnosis not present

## 2021-01-13 DIAGNOSIS — I69354 Hemiplegia and hemiparesis following cerebral infarction affecting left non-dominant side: Secondary | ICD-10-CM | POA: Diagnosis not present

## 2021-01-13 DIAGNOSIS — M549 Dorsalgia, unspecified: Secondary | ICD-10-CM | POA: Diagnosis not present

## 2021-01-13 DIAGNOSIS — H409 Unspecified glaucoma: Secondary | ICD-10-CM | POA: Diagnosis not present

## 2021-01-13 DIAGNOSIS — H269 Unspecified cataract: Secondary | ICD-10-CM | POA: Diagnosis not present

## 2021-01-13 DIAGNOSIS — G8929 Other chronic pain: Secondary | ICD-10-CM | POA: Diagnosis not present

## 2021-01-16 DIAGNOSIS — I10 Essential (primary) hypertension: Secondary | ICD-10-CM | POA: Diagnosis not present

## 2021-01-16 DIAGNOSIS — H269 Unspecified cataract: Secondary | ICD-10-CM | POA: Diagnosis not present

## 2021-01-16 DIAGNOSIS — J449 Chronic obstructive pulmonary disease, unspecified: Secondary | ICD-10-CM | POA: Diagnosis not present

## 2021-01-16 DIAGNOSIS — I69354 Hemiplegia and hemiparesis following cerebral infarction affecting left non-dominant side: Secondary | ICD-10-CM | POA: Diagnosis not present

## 2021-01-16 DIAGNOSIS — H409 Unspecified glaucoma: Secondary | ICD-10-CM | POA: Diagnosis not present

## 2021-01-16 DIAGNOSIS — E1140 Type 2 diabetes mellitus with diabetic neuropathy, unspecified: Secondary | ICD-10-CM | POA: Diagnosis not present

## 2021-01-16 DIAGNOSIS — M549 Dorsalgia, unspecified: Secondary | ICD-10-CM | POA: Diagnosis not present

## 2021-01-16 DIAGNOSIS — M47812 Spondylosis without myelopathy or radiculopathy, cervical region: Secondary | ICD-10-CM | POA: Diagnosis not present

## 2021-01-16 DIAGNOSIS — G8929 Other chronic pain: Secondary | ICD-10-CM | POA: Diagnosis not present

## 2021-01-20 DIAGNOSIS — M549 Dorsalgia, unspecified: Secondary | ICD-10-CM | POA: Diagnosis not present

## 2021-01-20 DIAGNOSIS — I69354 Hemiplegia and hemiparesis following cerebral infarction affecting left non-dominant side: Secondary | ICD-10-CM | POA: Diagnosis not present

## 2021-01-20 DIAGNOSIS — H269 Unspecified cataract: Secondary | ICD-10-CM | POA: Diagnosis not present

## 2021-01-20 DIAGNOSIS — E1140 Type 2 diabetes mellitus with diabetic neuropathy, unspecified: Secondary | ICD-10-CM | POA: Diagnosis not present

## 2021-01-20 DIAGNOSIS — H409 Unspecified glaucoma: Secondary | ICD-10-CM | POA: Diagnosis not present

## 2021-01-20 DIAGNOSIS — I10 Essential (primary) hypertension: Secondary | ICD-10-CM | POA: Diagnosis not present

## 2021-01-20 DIAGNOSIS — G8929 Other chronic pain: Secondary | ICD-10-CM | POA: Diagnosis not present

## 2021-01-20 DIAGNOSIS — M47812 Spondylosis without myelopathy or radiculopathy, cervical region: Secondary | ICD-10-CM | POA: Diagnosis not present

## 2021-01-20 DIAGNOSIS — J449 Chronic obstructive pulmonary disease, unspecified: Secondary | ICD-10-CM | POA: Diagnosis not present

## 2021-01-22 DIAGNOSIS — G8929 Other chronic pain: Secondary | ICD-10-CM | POA: Diagnosis not present

## 2021-01-22 DIAGNOSIS — I10 Essential (primary) hypertension: Secondary | ICD-10-CM | POA: Diagnosis not present

## 2021-01-22 DIAGNOSIS — M549 Dorsalgia, unspecified: Secondary | ICD-10-CM | POA: Diagnosis not present

## 2021-01-22 DIAGNOSIS — I69354 Hemiplegia and hemiparesis following cerebral infarction affecting left non-dominant side: Secondary | ICD-10-CM | POA: Diagnosis not present

## 2021-01-22 DIAGNOSIS — H409 Unspecified glaucoma: Secondary | ICD-10-CM | POA: Diagnosis not present

## 2021-01-22 DIAGNOSIS — H269 Unspecified cataract: Secondary | ICD-10-CM | POA: Diagnosis not present

## 2021-01-22 DIAGNOSIS — J449 Chronic obstructive pulmonary disease, unspecified: Secondary | ICD-10-CM | POA: Diagnosis not present

## 2021-01-22 DIAGNOSIS — M47812 Spondylosis without myelopathy or radiculopathy, cervical region: Secondary | ICD-10-CM | POA: Diagnosis not present

## 2021-01-22 DIAGNOSIS — E1140 Type 2 diabetes mellitus with diabetic neuropathy, unspecified: Secondary | ICD-10-CM | POA: Diagnosis not present

## 2021-01-28 ENCOUNTER — Other Ambulatory Visit: Payer: Self-pay | Admitting: Family Medicine

## 2021-01-28 ENCOUNTER — Telehealth: Payer: Self-pay | Admitting: Family Medicine

## 2021-01-28 DIAGNOSIS — I10 Essential (primary) hypertension: Secondary | ICD-10-CM

## 2021-01-28 DIAGNOSIS — K5900 Constipation, unspecified: Secondary | ICD-10-CM

## 2021-01-28 NOTE — Telephone Encounter (Signed)
Received fax from [facility].  She is trying to reach patient by phone to follow-up with her regarding scheduling sleep study.  Patient has braids in hair, and the sleep center needs to see a picture or learn more about what type of braid to determine how to setup the sensors that would attach to her head first before it can be scheduled.  Please notify patient and remind her to try to contact [facility] with the information or picture that they need so they can proceed w/ scheduling.  [facility] - Helene Kelp is [place] 01/28/2021, 5:54 PM

## 2021-01-29 NOTE — Telephone Encounter (Signed)
I called and spoke with the patient and notified her that [facility] has been trying to get in contact with her. She informed me that she spoke with them on yesterday and they was requesting her to take her braids out. The pt said she tried to explain to them that she has locs and that she cannot just untwisted or take them down. She said she will not be able to do the sleep study because they do not have any other options.    The patient said she feel like she just need a nebulizer machine to help when her COPD seems to flare up. She feel like this will help keep her from having to go to the emergency room for a COPD exacerbation. I recommended that the patient contact Dr. Raul Del office and request a order for a nebulizer. She verbalize understanding and said she will reach out to them.

## 2021-01-30 ENCOUNTER — Telehealth: Payer: Self-pay

## 2021-01-30 DIAGNOSIS — M47812 Spondylosis without myelopathy or radiculopathy, cervical region: Secondary | ICD-10-CM | POA: Diagnosis not present

## 2021-01-30 DIAGNOSIS — J449 Chronic obstructive pulmonary disease, unspecified: Secondary | ICD-10-CM | POA: Diagnosis not present

## 2021-01-30 DIAGNOSIS — M549 Dorsalgia, unspecified: Secondary | ICD-10-CM | POA: Diagnosis not present

## 2021-01-30 DIAGNOSIS — I69354 Hemiplegia and hemiparesis following cerebral infarction affecting left non-dominant side: Secondary | ICD-10-CM | POA: Diagnosis not present

## 2021-01-30 DIAGNOSIS — G8929 Other chronic pain: Secondary | ICD-10-CM | POA: Diagnosis not present

## 2021-01-30 DIAGNOSIS — E1140 Type 2 diabetes mellitus with diabetic neuropathy, unspecified: Secondary | ICD-10-CM | POA: Diagnosis not present

## 2021-01-30 DIAGNOSIS — I10 Essential (primary) hypertension: Secondary | ICD-10-CM | POA: Diagnosis not present

## 2021-01-30 DIAGNOSIS — H409 Unspecified glaucoma: Secondary | ICD-10-CM | POA: Diagnosis not present

## 2021-01-30 DIAGNOSIS — H269 Unspecified cataract: Secondary | ICD-10-CM | POA: Diagnosis not present

## 2021-01-30 NOTE — Telephone Encounter (Signed)
Thank you. Could you please notify patient about this coverage issue, and request that patient reach out to [facility] Endocrinology? Her last visit / initial consult was 12/23/20.  Nobie Putnam, [facility] 01/30/2021, 2:24 PM

## 2021-01-30 NOTE — Telephone Encounter (Signed)
Copied from [place] [id number]. Topic: General - Other >> Jan 30, 2021  1:35 PM Yvette Rack wrote: Reason for CRM: [name] with [facility] stated patient insurance will no longer cover the insulin lispro (HUMALOG) 100 UNIT/ML injection so she is requesting a new Rx for Novolog

## 2021-01-30 NOTE — Telephone Encounter (Signed)
FYI:The pt currently followed by Malissa Hippo, NP Endocrinology at [facility].

## 2021-01-30 NOTE — Telephone Encounter (Signed)
I called [employer] and requested that they send the insulin request to [facility] clinic Endocrinology.

## 2021-02-05 DIAGNOSIS — M542 Cervicalgia: Secondary | ICD-10-CM | POA: Diagnosis not present

## 2021-02-05 DIAGNOSIS — R531 Weakness: Secondary | ICD-10-CM | POA: Diagnosis not present

## 2021-02-05 DIAGNOSIS — R200 Anesthesia of skin: Secondary | ICD-10-CM | POA: Diagnosis not present

## 2021-02-05 DIAGNOSIS — R296 Repeated falls: Secondary | ICD-10-CM | POA: Diagnosis not present

## 2021-02-05 DIAGNOSIS — R202 Paresthesia of skin: Secondary | ICD-10-CM | POA: Diagnosis not present

## 2021-02-05 DIAGNOSIS — R252 Cramp and spasm: Secondary | ICD-10-CM | POA: Diagnosis not present

## 2021-02-05 DIAGNOSIS — R2689 Other abnormalities of gait and mobility: Secondary | ICD-10-CM | POA: Diagnosis not present

## 2021-02-06 DIAGNOSIS — M47812 Spondylosis without myelopathy or radiculopathy, cervical region: Secondary | ICD-10-CM | POA: Diagnosis not present

## 2021-02-06 DIAGNOSIS — M549 Dorsalgia, unspecified: Secondary | ICD-10-CM | POA: Diagnosis not present

## 2021-02-06 DIAGNOSIS — J449 Chronic obstructive pulmonary disease, unspecified: Secondary | ICD-10-CM | POA: Diagnosis not present

## 2021-02-06 DIAGNOSIS — H269 Unspecified cataract: Secondary | ICD-10-CM | POA: Diagnosis not present

## 2021-02-06 DIAGNOSIS — H409 Unspecified glaucoma: Secondary | ICD-10-CM | POA: Diagnosis not present

## 2021-02-06 DIAGNOSIS — I10 Essential (primary) hypertension: Secondary | ICD-10-CM | POA: Diagnosis not present

## 2021-02-06 DIAGNOSIS — G8929 Other chronic pain: Secondary | ICD-10-CM | POA: Diagnosis not present

## 2021-02-06 DIAGNOSIS — I69354 Hemiplegia and hemiparesis following cerebral infarction affecting left non-dominant side: Secondary | ICD-10-CM | POA: Diagnosis not present

## 2021-02-06 DIAGNOSIS — E1140 Type 2 diabetes mellitus with diabetic neuropathy, unspecified: Secondary | ICD-10-CM | POA: Diagnosis not present

## 2021-02-07 DIAGNOSIS — F3189 Other bipolar disorder: Secondary | ICD-10-CM | POA: Diagnosis not present

## 2021-02-07 DIAGNOSIS — F4310 Post-traumatic stress disorder, unspecified: Secondary | ICD-10-CM | POA: Diagnosis not present

## 2021-02-10 ENCOUNTER — Other Ambulatory Visit: Payer: Self-pay | Admitting: Family Medicine

## 2021-02-11 DIAGNOSIS — H409 Unspecified glaucoma: Secondary | ICD-10-CM | POA: Diagnosis not present

## 2021-02-11 DIAGNOSIS — H269 Unspecified cataract: Secondary | ICD-10-CM | POA: Diagnosis not present

## 2021-02-11 DIAGNOSIS — I69354 Hemiplegia and hemiparesis following cerebral infarction affecting left non-dominant side: Secondary | ICD-10-CM | POA: Diagnosis not present

## 2021-02-11 DIAGNOSIS — E1140 Type 2 diabetes mellitus with diabetic neuropathy, unspecified: Secondary | ICD-10-CM | POA: Diagnosis not present

## 2021-02-11 DIAGNOSIS — M549 Dorsalgia, unspecified: Secondary | ICD-10-CM | POA: Diagnosis not present

## 2021-02-11 DIAGNOSIS — I10 Essential (primary) hypertension: Secondary | ICD-10-CM | POA: Diagnosis not present

## 2021-02-11 DIAGNOSIS — G8929 Other chronic pain: Secondary | ICD-10-CM | POA: Diagnosis not present

## 2021-02-11 DIAGNOSIS — J449 Chronic obstructive pulmonary disease, unspecified: Secondary | ICD-10-CM | POA: Diagnosis not present

## 2021-02-11 DIAGNOSIS — M47812 Spondylosis without myelopathy or radiculopathy, cervical region: Secondary | ICD-10-CM | POA: Diagnosis not present

## 2021-02-12 DIAGNOSIS — E119 Type 2 diabetes mellitus without complications: Secondary | ICD-10-CM | POA: Diagnosis not present

## 2021-02-12 DIAGNOSIS — M542 Cervicalgia: Secondary | ICD-10-CM | POA: Diagnosis not present

## 2021-02-12 DIAGNOSIS — R531 Weakness: Secondary | ICD-10-CM | POA: Diagnosis not present

## 2021-02-12 DIAGNOSIS — R252 Cramp and spasm: Secondary | ICD-10-CM | POA: Diagnosis not present

## 2021-02-12 DIAGNOSIS — R202 Paresthesia of skin: Secondary | ICD-10-CM | POA: Diagnosis not present

## 2021-02-12 DIAGNOSIS — R2689 Other abnormalities of gait and mobility: Secondary | ICD-10-CM | POA: Diagnosis not present

## 2021-02-12 DIAGNOSIS — Z794 Long term (current) use of insulin: Secondary | ICD-10-CM | POA: Diagnosis not present

## 2021-02-12 DIAGNOSIS — R296 Repeated falls: Secondary | ICD-10-CM | POA: Diagnosis not present

## 2021-02-12 DIAGNOSIS — R200 Anesthesia of skin: Secondary | ICD-10-CM | POA: Diagnosis not present

## 2021-02-12 DIAGNOSIS — E782 Mixed hyperlipidemia: Secondary | ICD-10-CM | POA: Diagnosis not present

## 2021-02-19 DIAGNOSIS — R0602 Shortness of breath: Secondary | ICD-10-CM | POA: Diagnosis not present

## 2021-02-24 ENCOUNTER — Other Ambulatory Visit: Payer: Self-pay | Admitting: Family Medicine

## 2021-02-24 DIAGNOSIS — G459 Transient cerebral ischemic attack, unspecified: Secondary | ICD-10-CM | POA: Diagnosis not present

## 2021-02-24 DIAGNOSIS — I10 Essential (primary) hypertension: Secondary | ICD-10-CM

## 2021-02-24 DIAGNOSIS — K5900 Constipation, unspecified: Secondary | ICD-10-CM

## 2021-02-25 DIAGNOSIS — J449 Chronic obstructive pulmonary disease, unspecified: Secondary | ICD-10-CM | POA: Diagnosis not present

## 2021-02-25 DIAGNOSIS — M542 Cervicalgia: Secondary | ICD-10-CM | POA: Diagnosis not present

## 2021-02-25 DIAGNOSIS — I10 Essential (primary) hypertension: Secondary | ICD-10-CM | POA: Diagnosis not present

## 2021-02-25 DIAGNOSIS — R0602 Shortness of breath: Secondary | ICD-10-CM | POA: Diagnosis not present

## 2021-02-26 DIAGNOSIS — M47812 Spondylosis without myelopathy or radiculopathy, cervical region: Secondary | ICD-10-CM | POA: Diagnosis not present

## 2021-02-26 DIAGNOSIS — I69354 Hemiplegia and hemiparesis following cerebral infarction affecting left non-dominant side: Secondary | ICD-10-CM | POA: Diagnosis not present

## 2021-02-26 DIAGNOSIS — J449 Chronic obstructive pulmonary disease, unspecified: Secondary | ICD-10-CM | POA: Diagnosis not present

## 2021-02-26 DIAGNOSIS — M549 Dorsalgia, unspecified: Secondary | ICD-10-CM | POA: Diagnosis not present

## 2021-02-26 DIAGNOSIS — H269 Unspecified cataract: Secondary | ICD-10-CM | POA: Diagnosis not present

## 2021-02-26 DIAGNOSIS — E1140 Type 2 diabetes mellitus with diabetic neuropathy, unspecified: Secondary | ICD-10-CM | POA: Diagnosis not present

## 2021-02-26 DIAGNOSIS — G8929 Other chronic pain: Secondary | ICD-10-CM | POA: Diagnosis not present

## 2021-02-26 DIAGNOSIS — H409 Unspecified glaucoma: Secondary | ICD-10-CM | POA: Diagnosis not present

## 2021-02-26 DIAGNOSIS — I10 Essential (primary) hypertension: Secondary | ICD-10-CM | POA: Diagnosis not present

## 2021-03-21 ENCOUNTER — Other Ambulatory Visit: Payer: Self-pay | Admitting: Family Medicine

## 2021-03-21 DIAGNOSIS — G8929 Other chronic pain: Secondary | ICD-10-CM | POA: Diagnosis not present

## 2021-03-21 DIAGNOSIS — H269 Unspecified cataract: Secondary | ICD-10-CM | POA: Diagnosis not present

## 2021-03-21 DIAGNOSIS — I10 Essential (primary) hypertension: Secondary | ICD-10-CM | POA: Diagnosis not present

## 2021-03-21 DIAGNOSIS — M47812 Spondylosis without myelopathy or radiculopathy, cervical region: Secondary | ICD-10-CM | POA: Diagnosis not present

## 2021-03-21 DIAGNOSIS — M549 Dorsalgia, unspecified: Secondary | ICD-10-CM | POA: Diagnosis not present

## 2021-03-21 DIAGNOSIS — H409 Unspecified glaucoma: Secondary | ICD-10-CM | POA: Diagnosis not present

## 2021-03-21 DIAGNOSIS — E1140 Type 2 diabetes mellitus with diabetic neuropathy, unspecified: Secondary | ICD-10-CM | POA: Diagnosis not present

## 2021-03-21 DIAGNOSIS — J449 Chronic obstructive pulmonary disease, unspecified: Secondary | ICD-10-CM | POA: Diagnosis not present

## 2021-03-21 DIAGNOSIS — I69354 Hemiplegia and hemiparesis following cerebral infarction affecting left non-dominant side: Secondary | ICD-10-CM | POA: Diagnosis not present

## 2021-03-21 NOTE — Telephone Encounter (Signed)
Requested medications are due for refill today.  unknown  Requested medications are on the active medications list.   Discontinued 12/17/2020  Last refill.   Future visit scheduled.   Yes. Patient was to return 03/16/2021 for reevaluation and sleep study.  Notes to clinic.  Please advise.

## 2021-03-24 ENCOUNTER — Other Ambulatory Visit: Payer: Self-pay | Admitting: Internal Medicine

## 2021-03-24 DIAGNOSIS — I10 Essential (primary) hypertension: Secondary | ICD-10-CM

## 2021-03-24 DIAGNOSIS — K5900 Constipation, unspecified: Secondary | ICD-10-CM

## 2021-03-24 NOTE — Telephone Encounter (Signed)
Requested Prescriptions  Pending Prescriptions Disp Refills  . bisoprolol (ZEBETA) 10 MG tablet [Pharmacy Med Name: BISOPROLOL FUMARATE 10 MG TAB] 30 tablet 2    Sig: Take 1 tablet (10 mg total) by mouth daily.     Cardiovascular:  Beta Blockers Passed - 03/24/2021  2:37 PM      Passed - Last BP in normal range    BP Readings from Last 1 Encounters:  12/17/20 134/72         Passed - Last Heart Rate in normal range    Pulse Readings from Last 1 Encounters:  12/17/20 80         Passed - Valid encounter within last 6 months    Recent Outpatient Visits          3 months ago Uncontrolled type 2 diabetes mellitus with hyperglycemia [facility])   [place], DO   5 months ago Encounter to establish care with new doctor   [facility], Lupita Raider, FNP      Future Appointments            In 9 months [facility], [place]            . FARXIGA 5 MG TABS tablet [Pharmacy Med Name: FARXIGA 5 MG TABLET] 30 tablet 2    Sig: Take 1 tablet (5 mg total) by mouth daily.     Endocrinology:  Diabetes - SGLT2 Inhibitors Failed - 03/24/2021  2:37 PM      Failed - Cr in normal range and within 360 days    Creatinine, Ser  Date Value Ref Range Status  10/08/2020 1.06 (H) 0.44 - 1.00 mg/dL Final         Failed - LDL in normal range and within 360 days    No results found for: LDLCALC, LDLC, HIRISKLDL, POCLDL, LDLDIRECT, REALLDLC, TOTLDLC       Failed - HBA1C is between 0 and 7.9 and within 180 days    Hemoglobin A1C  Date Value Ref Range Status  10/21/2020 8.5 (A) 4.0 - 5.6 % Final         Failed - AA eGFR in normal range and within 360 days    GFR calc Af Amer  Date Value Ref Range Status  08/03/2020 59 (L) >60 mL/min Final   GFR, Estimated  Date Value Ref Range Status  10/08/2020 58 (L) >60 mL/min Final    Comment:    (NOTE) Calculated using the CKD-EPI Creatinine Equation (2021)          Passed - Valid  encounter within last 6 months    Recent Outpatient Visits          3 months ago Uncontrolled type 2 diabetes mellitus with hyperglycemia [facility])   [place], DO   5 months ago Encounter to establish care with new doctor   [facility], Lupita Raider, FNP      Future Appointments            In 9 months [facility], [place] 290 MCG CAPS capsule [Pharmacy Med Name: Rolan Lipa 290 MCG CAPSULE] 30 capsule 2    Sig: Take 1 capsule (290 mcg total) by mouth daily before breakfast.     Gastroenterology: Irritable Bowel Syndrome Passed - 03/24/2021  2:37 PM      Passed -  Valid encounter within last 12 months    Recent Outpatient Visits          3 months ago Uncontrolled type 2 diabetes mellitus with hyperglycemia [facility])   [place], DO   5 months ago Encounter to establish care with new doctor   [facility], Lupita Raider, FNP      Future Appointments            In 9 months [facility], [facility]

## 2021-03-26 ENCOUNTER — Other Ambulatory Visit: Payer: Self-pay

## 2021-03-26 DIAGNOSIS — R0600 Dyspnea, unspecified: Secondary | ICD-10-CM | POA: Diagnosis not present

## 2021-03-26 DIAGNOSIS — Z8673 Personal history of transient ischemic attack (TIA), and cerebral infarction without residual deficits: Secondary | ICD-10-CM | POA: Diagnosis not present

## 2021-03-26 DIAGNOSIS — R2689 Other abnormalities of gait and mobility: Secondary | ICD-10-CM | POA: Diagnosis not present

## 2021-03-26 DIAGNOSIS — R252 Cramp and spasm: Secondary | ICD-10-CM | POA: Diagnosis not present

## 2021-03-26 DIAGNOSIS — M542 Cervicalgia: Secondary | ICD-10-CM | POA: Diagnosis not present

## 2021-03-26 MED ORDER — AMLODIPINE BESYLATE 5 MG PO TABS: 5.0000 mg | ORAL_TABLET | Freq: Every day | ORAL | 3 refills | Status: DC

## 2021-03-27 DIAGNOSIS — E1140 Type 2 diabetes mellitus with diabetic neuropathy, unspecified: Secondary | ICD-10-CM | POA: Diagnosis not present

## 2021-03-27 DIAGNOSIS — M549 Dorsalgia, unspecified: Secondary | ICD-10-CM | POA: Diagnosis not present

## 2021-03-27 DIAGNOSIS — G8929 Other chronic pain: Secondary | ICD-10-CM | POA: Diagnosis not present

## 2021-03-27 DIAGNOSIS — H409 Unspecified glaucoma: Secondary | ICD-10-CM | POA: Diagnosis not present

## 2021-03-27 DIAGNOSIS — H269 Unspecified cataract: Secondary | ICD-10-CM | POA: Diagnosis not present

## 2021-03-27 DIAGNOSIS — J449 Chronic obstructive pulmonary disease, unspecified: Secondary | ICD-10-CM | POA: Diagnosis not present

## 2021-03-27 DIAGNOSIS — M47812 Spondylosis without myelopathy or radiculopathy, cervical region: Secondary | ICD-10-CM | POA: Diagnosis not present

## 2021-03-27 DIAGNOSIS — I10 Essential (primary) hypertension: Secondary | ICD-10-CM | POA: Diagnosis not present

## 2021-03-27 DIAGNOSIS — I69354 Hemiplegia and hemiparesis following cerebral infarction affecting left non-dominant side: Secondary | ICD-10-CM | POA: Diagnosis not present

## 2021-03-28 ENCOUNTER — Other Ambulatory Visit: Payer: Self-pay

## 2021-03-28 DIAGNOSIS — G8929 Other chronic pain: Secondary | ICD-10-CM | POA: Diagnosis not present

## 2021-03-28 DIAGNOSIS — M549 Dorsalgia, unspecified: Secondary | ICD-10-CM | POA: Diagnosis not present

## 2021-03-28 DIAGNOSIS — M47812 Spondylosis without myelopathy or radiculopathy, cervical region: Secondary | ICD-10-CM | POA: Diagnosis not present

## 2021-03-28 DIAGNOSIS — I10 Essential (primary) hypertension: Secondary | ICD-10-CM | POA: Diagnosis not present

## 2021-03-28 DIAGNOSIS — E1140 Type 2 diabetes mellitus with diabetic neuropathy, unspecified: Secondary | ICD-10-CM | POA: Diagnosis not present

## 2021-03-28 DIAGNOSIS — H409 Unspecified glaucoma: Secondary | ICD-10-CM | POA: Diagnosis not present

## 2021-03-28 DIAGNOSIS — H269 Unspecified cataract: Secondary | ICD-10-CM | POA: Diagnosis not present

## 2021-03-28 DIAGNOSIS — J449 Chronic obstructive pulmonary disease, unspecified: Secondary | ICD-10-CM | POA: Diagnosis not present

## 2021-03-28 DIAGNOSIS — I69354 Hemiplegia and hemiparesis following cerebral infarction affecting left non-dominant side: Secondary | ICD-10-CM | POA: Diagnosis not present

## 2021-03-28 MED ORDER — FLUTICASONE PROPIONATE 50 MCG/ACT NA SUSP: 1.0000 | Freq: Every day | NASAL | 0 refills | Status: DC

## 2021-03-31 DIAGNOSIS — J449 Chronic obstructive pulmonary disease, unspecified: Secondary | ICD-10-CM | POA: Diagnosis not present

## 2021-03-31 DIAGNOSIS — M549 Dorsalgia, unspecified: Secondary | ICD-10-CM | POA: Diagnosis not present

## 2021-03-31 DIAGNOSIS — H269 Unspecified cataract: Secondary | ICD-10-CM | POA: Diagnosis not present

## 2021-03-31 DIAGNOSIS — G8929 Other chronic pain: Secondary | ICD-10-CM | POA: Diagnosis not present

## 2021-03-31 DIAGNOSIS — I10 Essential (primary) hypertension: Secondary | ICD-10-CM | POA: Diagnosis not present

## 2021-03-31 DIAGNOSIS — M47812 Spondylosis without myelopathy or radiculopathy, cervical region: Secondary | ICD-10-CM | POA: Diagnosis not present

## 2021-03-31 DIAGNOSIS — H409 Unspecified glaucoma: Secondary | ICD-10-CM | POA: Diagnosis not present

## 2021-03-31 DIAGNOSIS — E1140 Type 2 diabetes mellitus with diabetic neuropathy, unspecified: Secondary | ICD-10-CM | POA: Diagnosis not present

## 2021-03-31 DIAGNOSIS — I69354 Hemiplegia and hemiparesis following cerebral infarction affecting left non-dominant side: Secondary | ICD-10-CM | POA: Diagnosis not present

## 2021-04-01 ENCOUNTER — Other Ambulatory Visit: Payer: Self-pay | Admitting: Neurology

## 2021-04-01 DIAGNOSIS — M542 Cervicalgia: Secondary | ICD-10-CM

## 2021-04-04 ENCOUNTER — Other Ambulatory Visit: Payer: Self-pay

## 2021-04-04 DIAGNOSIS — E1165 Type 2 diabetes mellitus with hyperglycemia: Secondary | ICD-10-CM

## 2021-04-04 LAB — HM DIABETES EYE EXAM

## 2021-04-04 MED ORDER — ROSUVASTATIN CALCIUM 10 MG PO TABS: 10.0000 mg | ORAL_TABLET | Freq: Every day | ORAL | 1 refills | Status: DC

## 2021-04-07 DIAGNOSIS — I10 Essential (primary) hypertension: Secondary | ICD-10-CM | POA: Diagnosis not present

## 2021-04-07 DIAGNOSIS — I69354 Hemiplegia and hemiparesis following cerebral infarction affecting left non-dominant side: Secondary | ICD-10-CM | POA: Diagnosis not present

## 2021-04-07 DIAGNOSIS — M47812 Spondylosis without myelopathy or radiculopathy, cervical region: Secondary | ICD-10-CM | POA: Diagnosis not present

## 2021-04-07 DIAGNOSIS — J449 Chronic obstructive pulmonary disease, unspecified: Secondary | ICD-10-CM | POA: Diagnosis not present

## 2021-04-07 DIAGNOSIS — H269 Unspecified cataract: Secondary | ICD-10-CM | POA: Diagnosis not present

## 2021-04-07 DIAGNOSIS — G8929 Other chronic pain: Secondary | ICD-10-CM | POA: Diagnosis not present

## 2021-04-07 DIAGNOSIS — M549 Dorsalgia, unspecified: Secondary | ICD-10-CM | POA: Diagnosis not present

## 2021-04-07 DIAGNOSIS — H409 Unspecified glaucoma: Secondary | ICD-10-CM | POA: Diagnosis not present

## 2021-04-07 DIAGNOSIS — E1140 Type 2 diabetes mellitus with diabetic neuropathy, unspecified: Secondary | ICD-10-CM | POA: Diagnosis not present

## 2021-04-08 ENCOUNTER — Ambulatory Visit: Payer: Medicare HMO

## 2021-04-09 ENCOUNTER — Other Ambulatory Visit: Payer: Self-pay

## 2021-04-17 ENCOUNTER — Other Ambulatory Visit: Payer: Self-pay | Admitting: Internal Medicine

## 2021-04-19 ENCOUNTER — Other Ambulatory Visit: Payer: Self-pay | Admitting: Internal Medicine

## 2021-04-19 NOTE — Telephone Encounter (Signed)
Requested medication (s) are due for refill today: yes  Requested medication (s) are on the active medication list: no  Last refill:  03/24/21   Future visit scheduled: no    Notes to clinic:  no refill protocol for this order   Requested Prescriptions  Pending Prescriptions Disp Refills   Insulin Syringe-Needle U-100 (GLOBAL INJECT EASE INSULIN SYR) 31G X 5/16" 1 ML MISC 100 each 0    Sig: Use as directed 3 times daily with humalog DX E11.65      There is no refill protocol information for this order

## 2021-04-24 ENCOUNTER — Other Ambulatory Visit: Payer: Self-pay | Admitting: Family Medicine

## 2021-04-24 ENCOUNTER — Ambulatory Visit
Admission: RE | Admit: 2021-04-24 | Discharge: 2021-04-24 | Disposition: A | Discharge status: Home / Self Care | Payer: Medicare HMO | Source: Ambulatory Visit | Attending: Neurology | Admitting: Neurology

## 2021-04-24 ENCOUNTER — Other Ambulatory Visit: Payer: Self-pay

## 2021-04-24 DIAGNOSIS — M542 Cervicalgia: Secondary | ICD-10-CM | POA: Insufficient documentation

## 2021-04-24 DIAGNOSIS — E1165 Type 2 diabetes mellitus with hyperglycemia: Secondary | ICD-10-CM

## 2021-04-28 DIAGNOSIS — R296 Repeated falls: Secondary | ICD-10-CM | POA: Diagnosis not present

## 2021-04-28 DIAGNOSIS — Z8673 Personal history of transient ischemic attack (TIA), and cerebral infarction without residual deficits: Secondary | ICD-10-CM | POA: Diagnosis not present

## 2021-04-28 DIAGNOSIS — R0600 Dyspnea, unspecified: Secondary | ICD-10-CM | POA: Diagnosis not present

## 2021-04-28 DIAGNOSIS — R252 Cramp and spasm: Secondary | ICD-10-CM | POA: Diagnosis not present

## 2021-04-28 DIAGNOSIS — M545 Low back pain, unspecified: Secondary | ICD-10-CM | POA: Diagnosis not present

## 2021-04-28 DIAGNOSIS — R2689 Other abnormalities of gait and mobility: Secondary | ICD-10-CM | POA: Diagnosis not present

## 2021-04-28 DIAGNOSIS — M542 Cervicalgia: Secondary | ICD-10-CM | POA: Diagnosis not present

## 2021-04-28 DIAGNOSIS — G8929 Other chronic pain: Secondary | ICD-10-CM | POA: Diagnosis not present

## 2021-04-28 DIAGNOSIS — R531 Weakness: Secondary | ICD-10-CM | POA: Diagnosis not present

## 2021-04-29 DIAGNOSIS — M542 Cervicalgia: Secondary | ICD-10-CM | POA: Diagnosis not present

## 2021-04-29 DIAGNOSIS — M5412 Radiculopathy, cervical region: Secondary | ICD-10-CM | POA: Diagnosis not present

## 2021-04-29 DIAGNOSIS — M9931 Osseous stenosis of neural canal of cervical region: Secondary | ICD-10-CM | POA: Diagnosis not present

## 2021-05-02 DIAGNOSIS — M4722 Other spondylosis with radiculopathy, cervical region: Secondary | ICD-10-CM | POA: Diagnosis not present

## 2021-05-02 DIAGNOSIS — J449 Chronic obstructive pulmonary disease, unspecified: Secondary | ICD-10-CM | POA: Diagnosis not present

## 2021-05-02 DIAGNOSIS — E119 Type 2 diabetes mellitus without complications: Secondary | ICD-10-CM | POA: Diagnosis not present

## 2021-05-02 DIAGNOSIS — M9931 Osseous stenosis of neural canal of cervical region: Secondary | ICD-10-CM | POA: Diagnosis not present

## 2021-05-02 DIAGNOSIS — I10 Essential (primary) hypertension: Secondary | ICD-10-CM | POA: Diagnosis not present

## 2021-05-02 DIAGNOSIS — I69344 Monoplegia of lower limb following cerebral infarction affecting left non-dominant side: Secondary | ICD-10-CM | POA: Diagnosis not present

## 2021-05-02 DIAGNOSIS — G8929 Other chronic pain: Secondary | ICD-10-CM | POA: Diagnosis not present

## 2021-05-02 DIAGNOSIS — G4730 Sleep apnea, unspecified: Secondary | ICD-10-CM | POA: Diagnosis not present

## 2021-05-02 DIAGNOSIS — Z794 Long term (current) use of insulin: Secondary | ICD-10-CM | POA: Diagnosis not present

## 2021-05-08 DIAGNOSIS — E119 Type 2 diabetes mellitus without complications: Secondary | ICD-10-CM | POA: Diagnosis not present

## 2021-05-08 DIAGNOSIS — G8929 Other chronic pain: Secondary | ICD-10-CM | POA: Diagnosis not present

## 2021-05-08 DIAGNOSIS — J449 Chronic obstructive pulmonary disease, unspecified: Secondary | ICD-10-CM | POA: Diagnosis not present

## 2021-05-08 DIAGNOSIS — M9931 Osseous stenosis of neural canal of cervical region: Secondary | ICD-10-CM | POA: Diagnosis not present

## 2021-05-08 DIAGNOSIS — I69344 Monoplegia of lower limb following cerebral infarction affecting left non-dominant side: Secondary | ICD-10-CM | POA: Diagnosis not present

## 2021-05-08 DIAGNOSIS — G4730 Sleep apnea, unspecified: Secondary | ICD-10-CM | POA: Diagnosis not present

## 2021-05-08 DIAGNOSIS — Z794 Long term (current) use of insulin: Secondary | ICD-10-CM | POA: Diagnosis not present

## 2021-05-08 DIAGNOSIS — I10 Essential (primary) hypertension: Secondary | ICD-10-CM | POA: Diagnosis not present

## 2021-05-08 DIAGNOSIS — M4722 Other spondylosis with radiculopathy, cervical region: Secondary | ICD-10-CM | POA: Diagnosis not present

## 2021-05-09 DIAGNOSIS — J449 Chronic obstructive pulmonary disease, unspecified: Secondary | ICD-10-CM | POA: Diagnosis not present

## 2021-05-09 DIAGNOSIS — I10 Essential (primary) hypertension: Secondary | ICD-10-CM | POA: Diagnosis not present

## 2021-05-09 DIAGNOSIS — G4730 Sleep apnea, unspecified: Secondary | ICD-10-CM | POA: Diagnosis not present

## 2021-05-09 DIAGNOSIS — Z794 Long term (current) use of insulin: Secondary | ICD-10-CM | POA: Diagnosis not present

## 2021-05-09 DIAGNOSIS — I69344 Monoplegia of lower limb following cerebral infarction affecting left non-dominant side: Secondary | ICD-10-CM | POA: Diagnosis not present

## 2021-05-09 DIAGNOSIS — E119 Type 2 diabetes mellitus without complications: Secondary | ICD-10-CM | POA: Diagnosis not present

## 2021-05-09 DIAGNOSIS — G8929 Other chronic pain: Secondary | ICD-10-CM | POA: Diagnosis not present

## 2021-05-09 DIAGNOSIS — M9931 Osseous stenosis of neural canal of cervical region: Secondary | ICD-10-CM | POA: Diagnosis not present

## 2021-05-09 DIAGNOSIS — M4722 Other spondylosis with radiculopathy, cervical region: Secondary | ICD-10-CM | POA: Diagnosis not present

## 2021-05-12 ENCOUNTER — Telehealth: Payer: Self-pay

## 2021-05-12 DIAGNOSIS — G8929 Other chronic pain: Secondary | ICD-10-CM | POA: Diagnosis not present

## 2021-05-12 DIAGNOSIS — G4730 Sleep apnea, unspecified: Secondary | ICD-10-CM | POA: Diagnosis not present

## 2021-05-12 DIAGNOSIS — E119 Type 2 diabetes mellitus without complications: Secondary | ICD-10-CM | POA: Diagnosis not present

## 2021-05-12 DIAGNOSIS — I69344 Monoplegia of lower limb following cerebral infarction affecting left non-dominant side: Secondary | ICD-10-CM | POA: Diagnosis not present

## 2021-05-12 DIAGNOSIS — J449 Chronic obstructive pulmonary disease, unspecified: Secondary | ICD-10-CM | POA: Diagnosis not present

## 2021-05-12 DIAGNOSIS — I10 Essential (primary) hypertension: Secondary | ICD-10-CM | POA: Diagnosis not present

## 2021-05-12 DIAGNOSIS — M9931 Osseous stenosis of neural canal of cervical region: Secondary | ICD-10-CM | POA: Diagnosis not present

## 2021-05-12 DIAGNOSIS — M4722 Other spondylosis with radiculopathy, cervical region: Secondary | ICD-10-CM | POA: Diagnosis not present

## 2021-05-12 DIAGNOSIS — Z794 Long term (current) use of insulin: Secondary | ICD-10-CM | POA: Diagnosis not present

## 2021-05-12 NOTE — Telephone Encounter (Signed)
Copied from [place] [id number]. Topic: Quick Communication - Home Health Verbal Orders >> May 08, 2021  4:27 PM Tessa Lerner A wrote: Caller/Agency: Leory Plowman  Callback Number:  [phone]  Requesting OT/PT/Skilled Nursing/Social Work/Speech Therapy: OT  Frequency: 1w1 2w2 1w1

## 2021-05-12 NOTE — Telephone Encounter (Signed)
Ok for verbal orders as requested 

## 2021-05-13 DIAGNOSIS — J449 Chronic obstructive pulmonary disease, unspecified: Secondary | ICD-10-CM | POA: Diagnosis not present

## 2021-05-13 DIAGNOSIS — Z794 Long term (current) use of insulin: Secondary | ICD-10-CM | POA: Diagnosis not present

## 2021-05-13 DIAGNOSIS — E119 Type 2 diabetes mellitus without complications: Secondary | ICD-10-CM | POA: Diagnosis not present

## 2021-05-13 DIAGNOSIS — I69344 Monoplegia of lower limb following cerebral infarction affecting left non-dominant side: Secondary | ICD-10-CM | POA: Diagnosis not present

## 2021-05-13 DIAGNOSIS — M9931 Osseous stenosis of neural canal of cervical region: Secondary | ICD-10-CM | POA: Diagnosis not present

## 2021-05-13 DIAGNOSIS — G8929 Other chronic pain: Secondary | ICD-10-CM | POA: Diagnosis not present

## 2021-05-13 DIAGNOSIS — I10 Essential (primary) hypertension: Secondary | ICD-10-CM | POA: Diagnosis not present

## 2021-05-13 DIAGNOSIS — M4722 Other spondylosis with radiculopathy, cervical region: Secondary | ICD-10-CM | POA: Diagnosis not present

## 2021-05-13 DIAGNOSIS — G4730 Sleep apnea, unspecified: Secondary | ICD-10-CM | POA: Diagnosis not present

## 2021-05-13 NOTE — Telephone Encounter (Signed)
I called and left a detail message on [place], [place] from [place] given her the verbal per [place].

## 2021-05-14 DIAGNOSIS — F4310 Post-traumatic stress disorder, unspecified: Secondary | ICD-10-CM | POA: Diagnosis not present

## 2021-05-14 DIAGNOSIS — F3189 Other bipolar disorder: Secondary | ICD-10-CM | POA: Diagnosis not present

## 2021-05-14 LAB — HEMOGLOBIN A1C: Hemoglobin A1C: 11.7

## 2021-05-15 ENCOUNTER — Ambulatory Visit: Payer: Medicare HMO | Admitting: Urology

## 2021-05-16 ENCOUNTER — Encounter: Payer: Self-pay | Admitting: Urology

## 2021-05-16 DIAGNOSIS — M9931 Osseous stenosis of neural canal of cervical region: Secondary | ICD-10-CM | POA: Diagnosis not present

## 2021-05-16 DIAGNOSIS — Z794 Long term (current) use of insulin: Secondary | ICD-10-CM | POA: Diagnosis not present

## 2021-05-16 DIAGNOSIS — G8929 Other chronic pain: Secondary | ICD-10-CM | POA: Diagnosis not present

## 2021-05-16 DIAGNOSIS — I69344 Monoplegia of lower limb following cerebral infarction affecting left non-dominant side: Secondary | ICD-10-CM | POA: Diagnosis not present

## 2021-05-16 DIAGNOSIS — M4722 Other spondylosis with radiculopathy, cervical region: Secondary | ICD-10-CM | POA: Diagnosis not present

## 2021-05-16 DIAGNOSIS — J449 Chronic obstructive pulmonary disease, unspecified: Secondary | ICD-10-CM | POA: Diagnosis not present

## 2021-05-16 DIAGNOSIS — I10 Essential (primary) hypertension: Secondary | ICD-10-CM | POA: Diagnosis not present

## 2021-05-16 DIAGNOSIS — E119 Type 2 diabetes mellitus without complications: Secondary | ICD-10-CM | POA: Diagnosis not present

## 2021-05-16 DIAGNOSIS — G4730 Sleep apnea, unspecified: Secondary | ICD-10-CM | POA: Diagnosis not present

## 2021-05-19 ENCOUNTER — Telehealth: Payer: Self-pay | Admitting: Internal Medicine

## 2021-05-19 DIAGNOSIS — E782 Mixed hyperlipidemia: Secondary | ICD-10-CM | POA: Diagnosis not present

## 2021-05-19 DIAGNOSIS — Z794 Long term (current) use of insulin: Secondary | ICD-10-CM | POA: Diagnosis not present

## 2021-05-19 DIAGNOSIS — I10 Essential (primary) hypertension: Secondary | ICD-10-CM | POA: Diagnosis not present

## 2021-05-19 DIAGNOSIS — E119 Type 2 diabetes mellitus without complications: Secondary | ICD-10-CM | POA: Diagnosis not present

## 2021-05-19 DIAGNOSIS — E669 Obesity, unspecified: Secondary | ICD-10-CM | POA: Diagnosis not present

## 2021-05-19 MED ORDER — BD PEN NEEDLE NANO U/F 32G X 4 MM MISC: 1.0000 | Freq: Every day | 3 refills | Status: DC

## 2021-05-19 NOTE — Telephone Encounter (Signed)
refilled 

## 2021-05-19 NOTE — Telephone Encounter (Signed)
Medication Refill - Medication: Insulin Syringe-Needle U-100 (GLOBAL INJECT EASE INSULIN SYR) 31G X 5/16" 1 ML MISC  Pt is completely out, [name] from [employer] called to request this   Has the patient contacted their pharmacy? Yes.   (Agent: If no, request that the patient contact the pharmacy for the refill.) (Agent: If yes, when and what did the pharmacy advise?)  Preferred Pharmacy (with phone number or street name):  [facility] [place], [place] [address] [place]  [place] [address]  Phone: [phone] Fax: [phone]    Agent: Please be advised that RX refills may take up to 3 business days. We ask that you follow-up with your pharmacy.

## 2021-05-19 NOTE — Addendum Note (Signed)
Addended by: Jearld Fenton on: 05/19/2021 03:18 PM   Modules accepted: Orders

## 2021-05-19 NOTE — Telephone Encounter (Signed)
Review for refill Script has expired and will not let me refill

## 2021-05-20 ENCOUNTER — Other Ambulatory Visit: Payer: Self-pay

## 2021-05-20 DIAGNOSIS — M9931 Osseous stenosis of neural canal of cervical region: Secondary | ICD-10-CM | POA: Diagnosis not present

## 2021-05-20 DIAGNOSIS — E119 Type 2 diabetes mellitus without complications: Secondary | ICD-10-CM | POA: Diagnosis not present

## 2021-05-20 DIAGNOSIS — G4730 Sleep apnea, unspecified: Secondary | ICD-10-CM | POA: Diagnosis not present

## 2021-05-20 DIAGNOSIS — F321 Major depressive disorder, single episode, moderate: Secondary | ICD-10-CM | POA: Diagnosis not present

## 2021-05-20 DIAGNOSIS — I69344 Monoplegia of lower limb following cerebral infarction affecting left non-dominant side: Secondary | ICD-10-CM | POA: Diagnosis not present

## 2021-05-20 DIAGNOSIS — M4722 Other spondylosis with radiculopathy, cervical region: Secondary | ICD-10-CM | POA: Diagnosis not present

## 2021-05-20 DIAGNOSIS — F419 Anxiety disorder, unspecified: Secondary | ICD-10-CM | POA: Diagnosis not present

## 2021-05-20 DIAGNOSIS — Z794 Long term (current) use of insulin: Secondary | ICD-10-CM | POA: Diagnosis not present

## 2021-05-20 DIAGNOSIS — G8929 Other chronic pain: Secondary | ICD-10-CM | POA: Diagnosis not present

## 2021-05-20 DIAGNOSIS — J449 Chronic obstructive pulmonary disease, unspecified: Secondary | ICD-10-CM | POA: Diagnosis not present

## 2021-05-20 DIAGNOSIS — I10 Essential (primary) hypertension: Secondary | ICD-10-CM | POA: Diagnosis not present

## 2021-05-20 MED ORDER — FLUTICASONE-SALMETEROL 250-50 MCG/ACT IN AEPB: 1.0000 | INHALATION_SPRAY | Freq: Two times a day (BID) | RESPIRATORY_TRACT | 2 refills | Status: DC

## 2021-05-20 MED ORDER — "INSULIN SYRINGE 31G X 5/16"" 1 ML MISC": 1.0000 | Freq: Four times a day (QID) | 3 refills | Status: DC

## 2021-05-20 NOTE — Telephone Encounter (Signed)
Copied from [place] [id number]. Topic: Quick Communication - Rx Refill/Question >> May 19, 2021  4:57 PM [place], Apolonio Schneiders wrote: [name] from [place], [place] was calling to ask for "syringe 49ml 31 gage x5" to be sent in, caller stated the wrong needles were sent in for the pt.

## 2021-05-20 NOTE — Telephone Encounter (Signed)
The pharmacy called requesting a refill on the [facility] inhaler. I fixed the insulin syring order and sent it over.

## 2021-05-21 DIAGNOSIS — E119 Type 2 diabetes mellitus without complications: Secondary | ICD-10-CM | POA: Diagnosis not present

## 2021-05-21 DIAGNOSIS — G4730 Sleep apnea, unspecified: Secondary | ICD-10-CM | POA: Diagnosis not present

## 2021-05-21 DIAGNOSIS — M4722 Other spondylosis with radiculopathy, cervical region: Secondary | ICD-10-CM | POA: Diagnosis not present

## 2021-05-21 DIAGNOSIS — G8929 Other chronic pain: Secondary | ICD-10-CM | POA: Diagnosis not present

## 2021-05-21 DIAGNOSIS — M9931 Osseous stenosis of neural canal of cervical region: Secondary | ICD-10-CM | POA: Diagnosis not present

## 2021-05-21 DIAGNOSIS — I69344 Monoplegia of lower limb following cerebral infarction affecting left non-dominant side: Secondary | ICD-10-CM | POA: Diagnosis not present

## 2021-05-21 DIAGNOSIS — J449 Chronic obstructive pulmonary disease, unspecified: Secondary | ICD-10-CM | POA: Diagnosis not present

## 2021-05-21 DIAGNOSIS — Z794 Long term (current) use of insulin: Secondary | ICD-10-CM | POA: Diagnosis not present

## 2021-05-21 DIAGNOSIS — I10 Essential (primary) hypertension: Secondary | ICD-10-CM | POA: Diagnosis not present

## 2021-05-22 DIAGNOSIS — G8929 Other chronic pain: Secondary | ICD-10-CM | POA: Diagnosis not present

## 2021-05-22 DIAGNOSIS — J449 Chronic obstructive pulmonary disease, unspecified: Secondary | ICD-10-CM | POA: Diagnosis not present

## 2021-05-22 DIAGNOSIS — I69344 Monoplegia of lower limb following cerebral infarction affecting left non-dominant side: Secondary | ICD-10-CM | POA: Diagnosis not present

## 2021-05-22 DIAGNOSIS — M9931 Osseous stenosis of neural canal of cervical region: Secondary | ICD-10-CM | POA: Diagnosis not present

## 2021-05-22 DIAGNOSIS — I10 Essential (primary) hypertension: Secondary | ICD-10-CM | POA: Diagnosis not present

## 2021-05-22 DIAGNOSIS — M4722 Other spondylosis with radiculopathy, cervical region: Secondary | ICD-10-CM | POA: Diagnosis not present

## 2021-05-22 DIAGNOSIS — Z794 Long term (current) use of insulin: Secondary | ICD-10-CM | POA: Diagnosis not present

## 2021-05-22 DIAGNOSIS — G4730 Sleep apnea, unspecified: Secondary | ICD-10-CM | POA: Diagnosis not present

## 2021-05-22 DIAGNOSIS — E119 Type 2 diabetes mellitus without complications: Secondary | ICD-10-CM | POA: Diagnosis not present

## 2021-05-26 DIAGNOSIS — Z794 Long term (current) use of insulin: Secondary | ICD-10-CM | POA: Diagnosis not present

## 2021-05-26 DIAGNOSIS — M9931 Osseous stenosis of neural canal of cervical region: Secondary | ICD-10-CM | POA: Diagnosis not present

## 2021-05-26 DIAGNOSIS — J449 Chronic obstructive pulmonary disease, unspecified: Secondary | ICD-10-CM | POA: Diagnosis not present

## 2021-05-26 DIAGNOSIS — E119 Type 2 diabetes mellitus without complications: Secondary | ICD-10-CM | POA: Diagnosis not present

## 2021-05-26 DIAGNOSIS — G8929 Other chronic pain: Secondary | ICD-10-CM | POA: Diagnosis not present

## 2021-05-26 DIAGNOSIS — I10 Essential (primary) hypertension: Secondary | ICD-10-CM | POA: Diagnosis not present

## 2021-05-26 DIAGNOSIS — I69344 Monoplegia of lower limb following cerebral infarction affecting left non-dominant side: Secondary | ICD-10-CM | POA: Diagnosis not present

## 2021-05-26 DIAGNOSIS — M4722 Other spondylosis with radiculopathy, cervical region: Secondary | ICD-10-CM | POA: Diagnosis not present

## 2021-05-26 DIAGNOSIS — G4730 Sleep apnea, unspecified: Secondary | ICD-10-CM | POA: Diagnosis not present

## 2021-05-27 DIAGNOSIS — Z794 Long term (current) use of insulin: Secondary | ICD-10-CM | POA: Diagnosis not present

## 2021-05-27 DIAGNOSIS — G4730 Sleep apnea, unspecified: Secondary | ICD-10-CM | POA: Diagnosis not present

## 2021-05-27 DIAGNOSIS — G8929 Other chronic pain: Secondary | ICD-10-CM | POA: Diagnosis not present

## 2021-05-27 DIAGNOSIS — E119 Type 2 diabetes mellitus without complications: Secondary | ICD-10-CM | POA: Diagnosis not present

## 2021-05-27 DIAGNOSIS — J449 Chronic obstructive pulmonary disease, unspecified: Secondary | ICD-10-CM | POA: Diagnosis not present

## 2021-05-27 DIAGNOSIS — M9931 Osseous stenosis of neural canal of cervical region: Secondary | ICD-10-CM | POA: Diagnosis not present

## 2021-05-27 DIAGNOSIS — I10 Essential (primary) hypertension: Secondary | ICD-10-CM | POA: Diagnosis not present

## 2021-05-27 DIAGNOSIS — M4722 Other spondylosis with radiculopathy, cervical region: Secondary | ICD-10-CM | POA: Diagnosis not present

## 2021-05-27 DIAGNOSIS — I69344 Monoplegia of lower limb following cerebral infarction affecting left non-dominant side: Secondary | ICD-10-CM | POA: Diagnosis not present

## 2021-05-29 DIAGNOSIS — G4730 Sleep apnea, unspecified: Secondary | ICD-10-CM | POA: Diagnosis not present

## 2021-05-29 DIAGNOSIS — M4722 Other spondylosis with radiculopathy, cervical region: Secondary | ICD-10-CM | POA: Diagnosis not present

## 2021-05-29 DIAGNOSIS — Z794 Long term (current) use of insulin: Secondary | ICD-10-CM | POA: Diagnosis not present

## 2021-05-29 DIAGNOSIS — I10 Essential (primary) hypertension: Secondary | ICD-10-CM | POA: Diagnosis not present

## 2021-05-29 DIAGNOSIS — E119 Type 2 diabetes mellitus without complications: Secondary | ICD-10-CM | POA: Diagnosis not present

## 2021-05-29 DIAGNOSIS — I69344 Monoplegia of lower limb following cerebral infarction affecting left non-dominant side: Secondary | ICD-10-CM | POA: Diagnosis not present

## 2021-05-29 DIAGNOSIS — M9931 Osseous stenosis of neural canal of cervical region: Secondary | ICD-10-CM | POA: Diagnosis not present

## 2021-05-29 DIAGNOSIS — J449 Chronic obstructive pulmonary disease, unspecified: Secondary | ICD-10-CM | POA: Diagnosis not present

## 2021-05-29 DIAGNOSIS — G8929 Other chronic pain: Secondary | ICD-10-CM | POA: Diagnosis not present

## 2021-06-03 ENCOUNTER — Other Ambulatory Visit: Payer: Self-pay | Admitting: Family Medicine

## 2021-06-03 DIAGNOSIS — M4722 Other spondylosis with radiculopathy, cervical region: Secondary | ICD-10-CM | POA: Diagnosis not present

## 2021-06-03 DIAGNOSIS — M9931 Osseous stenosis of neural canal of cervical region: Secondary | ICD-10-CM | POA: Diagnosis not present

## 2021-06-03 DIAGNOSIS — I10 Essential (primary) hypertension: Secondary | ICD-10-CM | POA: Diagnosis not present

## 2021-06-03 DIAGNOSIS — E119 Type 2 diabetes mellitus without complications: Secondary | ICD-10-CM | POA: Diagnosis not present

## 2021-06-03 DIAGNOSIS — Z1231 Encounter for screening mammogram for malignant neoplasm of breast: Secondary | ICD-10-CM

## 2021-06-03 DIAGNOSIS — I69344 Monoplegia of lower limb following cerebral infarction affecting left non-dominant side: Secondary | ICD-10-CM | POA: Diagnosis not present

## 2021-06-03 DIAGNOSIS — J449 Chronic obstructive pulmonary disease, unspecified: Secondary | ICD-10-CM | POA: Diagnosis not present

## 2021-06-03 DIAGNOSIS — Z794 Long term (current) use of insulin: Secondary | ICD-10-CM | POA: Diagnosis not present

## 2021-06-03 DIAGNOSIS — G8929 Other chronic pain: Secondary | ICD-10-CM | POA: Diagnosis not present

## 2021-06-03 DIAGNOSIS — G4730 Sleep apnea, unspecified: Secondary | ICD-10-CM | POA: Diagnosis not present

## 2021-06-04 DIAGNOSIS — F419 Anxiety disorder, unspecified: Secondary | ICD-10-CM | POA: Diagnosis not present

## 2021-06-04 DIAGNOSIS — F321 Major depressive disorder, single episode, moderate: Secondary | ICD-10-CM | POA: Diagnosis not present

## 2021-06-09 DIAGNOSIS — F419 Anxiety disorder, unspecified: Secondary | ICD-10-CM | POA: Diagnosis not present

## 2021-06-09 DIAGNOSIS — F321 Major depressive disorder, single episode, moderate: Secondary | ICD-10-CM | POA: Diagnosis not present

## 2021-06-10 ENCOUNTER — Telehealth: Payer: Self-pay

## 2021-06-10 DIAGNOSIS — I10 Essential (primary) hypertension: Secondary | ICD-10-CM | POA: Diagnosis not present

## 2021-06-10 DIAGNOSIS — E119 Type 2 diabetes mellitus without complications: Secondary | ICD-10-CM | POA: Diagnosis not present

## 2021-06-10 DIAGNOSIS — Z794 Long term (current) use of insulin: Secondary | ICD-10-CM | POA: Diagnosis not present

## 2021-06-10 DIAGNOSIS — I69344 Monoplegia of lower limb following cerebral infarction affecting left non-dominant side: Secondary | ICD-10-CM | POA: Diagnosis not present

## 2021-06-10 DIAGNOSIS — J449 Chronic obstructive pulmonary disease, unspecified: Secondary | ICD-10-CM | POA: Diagnosis not present

## 2021-06-10 DIAGNOSIS — M9931 Osseous stenosis of neural canal of cervical region: Secondary | ICD-10-CM | POA: Diagnosis not present

## 2021-06-10 DIAGNOSIS — G4730 Sleep apnea, unspecified: Secondary | ICD-10-CM | POA: Diagnosis not present

## 2021-06-10 DIAGNOSIS — M4722 Other spondylosis with radiculopathy, cervical region: Secondary | ICD-10-CM | POA: Diagnosis not present

## 2021-06-10 DIAGNOSIS — G8929 Other chronic pain: Secondary | ICD-10-CM | POA: Diagnosis not present

## 2021-06-10 NOTE — Telephone Encounter (Signed)
Patient states that she has appt with Dr. Raliegh Ip on 06/13/2021 and also has a mammogram on same day. Patient would like to know which one she should reschedule. I don't see any appointments with Dr. Raliegh Ip on 06/13/2021. Please advise

## 2021-06-11 DIAGNOSIS — H401133 Primary open-angle glaucoma, bilateral, severe stage: Secondary | ICD-10-CM | POA: Diagnosis not present

## 2021-06-11 DIAGNOSIS — E119 Type 2 diabetes mellitus without complications: Secondary | ICD-10-CM | POA: Diagnosis not present

## 2021-06-13 DIAGNOSIS — R252 Cramp and spasm: Secondary | ICD-10-CM | POA: Diagnosis not present

## 2021-06-13 DIAGNOSIS — R0600 Dyspnea, unspecified: Secondary | ICD-10-CM | POA: Diagnosis not present

## 2021-06-13 DIAGNOSIS — R2689 Other abnormalities of gait and mobility: Secondary | ICD-10-CM | POA: Diagnosis not present

## 2021-06-13 DIAGNOSIS — Z8673 Personal history of transient ischemic attack (TIA), and cerebral infarction without residual deficits: Secondary | ICD-10-CM | POA: Diagnosis not present

## 2021-06-13 DIAGNOSIS — R531 Weakness: Secondary | ICD-10-CM | POA: Diagnosis not present

## 2021-06-13 DIAGNOSIS — M545 Low back pain, unspecified: Secondary | ICD-10-CM | POA: Diagnosis not present

## 2021-06-13 DIAGNOSIS — M542 Cervicalgia: Secondary | ICD-10-CM | POA: Diagnosis not present

## 2021-06-13 DIAGNOSIS — R296 Repeated falls: Secondary | ICD-10-CM | POA: Diagnosis not present

## 2021-06-16 ENCOUNTER — Other Ambulatory Visit: Payer: Self-pay | Admitting: Family Medicine

## 2021-06-16 DIAGNOSIS — K5900 Constipation, unspecified: Secondary | ICD-10-CM

## 2021-06-16 DIAGNOSIS — I10 Essential (primary) hypertension: Secondary | ICD-10-CM

## 2021-06-16 NOTE — Telephone Encounter (Signed)
Requested medication (s) are due for refill today: yes  Requested medication (s) are on the active medication list: yes  Last refill:  05/20/2021  Future visit scheduled: no  Notes to clinic:  Failed protocol: Cr in normal range and within 360 days   LDL in normal range and within 360 days   HBA1C is between 0 and 7.9 and within 180 days   AA eGFR in normal range and within 360 days     Requested Prescriptions  Pending Prescriptions Disp Refills   bisoprolol (ZEBETA) 10 MG tablet [Pharmacy Med Name: BISOPROLOL FUMARATE 10 MG TAB] 30 tablet 0    Sig: Take 1 tablet (10 mg total) by mouth daily.     Cardiovascular:  Beta Blockers Passed - 06/16/2021  1:09 PM      Passed - Last BP in normal range    BP Readings from Last 1 Encounters:  12/17/20 134/72          Passed - Last Heart Rate in normal range    Pulse Readings from Last 1 Encounters:  12/17/20 80          Passed - Valid encounter within last 6 months    Recent Outpatient Visits           6 months ago Uncontrolled type 2 diabetes mellitus with hyperglycemia [facility])   [place], DO   7 months ago Encounter to establish care with new doctor   [place], FNP       Future Appointments             In 6 months [facility], [place] 5 MG TABS tablet [Pharmacy Med Name: FARXIGA 5 MG TABLET] 30 tablet 0    Sig: Take 1 tablet (5 mg total) by mouth daily.     Endocrinology:  Diabetes - SGLT2 Inhibitors Failed - 06/16/2021  1:09 PM      Failed - Cr in normal range and within 360 days    Creatinine, Ser  Date Value Ref Range Status  10/08/2020 1.06 (H) 0.44 - 1.00 mg/dL Final          Failed - LDL in normal range and within 360 days    No results found for: LDLCALC, LDLC, HIRISKLDL, POCLDL, LDLDIRECT, REALLDLC, TOTLDLC        Failed - HBA1C is between 0 and 7.9 and within 180 days    Hemoglobin A1C   Date Value Ref Range Status  10/21/2020 8.5 (A) 4.0 - 5.6 % Final          Failed - AA eGFR in normal range and within 360 days    GFR calc Af Amer  Date Value Ref Range Status  08/03/2020 59 (L) >60 mL/min Final   GFR, Estimated  Date Value Ref Range Status  10/08/2020 58 (L) >60 mL/min Final    Comment:    (NOTE) Calculated using the CKD-EPI Creatinine Equation (2021)           Passed - Valid encounter within last 6 months    Recent Outpatient Visits           6 months ago Uncontrolled type 2 diabetes mellitus with hyperglycemia [facility])   [place], DO   7 months ago Encounter to establish care with new doctor   [place]  [place], FNP       Future Appointments             In 6 months [facility], [place] capsule [place] Med Name: Rolan Lipa 290 MCG CAPSULE] 30 capsule 0    Sig: Take 1 capsule (290 mcg total) by mouth daily before breakfast.     Gastroenterology: Irritable Bowel Syndrome Passed - 06/16/2021  1:09 PM      Passed - Valid encounter within last 12 months    Recent Outpatient Visits           6 months ago Uncontrolled type 2 diabetes mellitus with hyperglycemia [facility])   [place], DO   7 months ago Encounter to establish care with new doctor   [facility], Lupita Raider, FNP       Future Appointments             In 6 months [facility] [place], [facility]

## 2021-06-19 ENCOUNTER — Other Ambulatory Visit: Payer: Self-pay | Admitting: Family Medicine

## 2021-06-19 DIAGNOSIS — I10 Essential (primary) hypertension: Secondary | ICD-10-CM

## 2021-06-19 DIAGNOSIS — E1165 Type 2 diabetes mellitus with hyperglycemia: Secondary | ICD-10-CM

## 2021-06-19 DIAGNOSIS — K5900 Constipation, unspecified: Secondary | ICD-10-CM

## 2021-06-19 NOTE — Telephone Encounter (Signed)
  Notes to clinic:  Patient has appt on 07/09/2021 with Webb Silversmith  Review for short supply    Requested Prescriptions  Pending Prescriptions Disp Refills   LANTUS 100 UNIT/ML injection [Pharmacy Med Name: LANTUS 100 UNIT/ML VIAL]  0    Sig: Inject 0.32 mLs (32 Units total) into the skin daily.     Endocrinology:  Diabetes - Insulins Failed - 06/19/2021 12:46 PM      Failed - HBA1C is between 0 and 7.9 and within 180 days    Hemoglobin A1C  Date Value Ref Range Status  10/21/2020 8.5 (A) 4.0 - 5.6 % Final          Passed - Valid encounter within last 6 months    Recent Outpatient Visits           6 months ago Uncontrolled type 2 diabetes mellitus with hyperglycemia [facility])   [place], DO   8 months ago Encounter to establish care with new doctor   [place], FNP       Future Appointments             In 2 weeks Garnette Gunner, Coralie Keens, NP [facility], [place]   In 1 month MacDiarmid, Nicki Reaper, [place]   In 6 months  [facility], [facility]

## 2021-06-19 NOTE — Telephone Encounter (Signed)
Courtesy refill given until appointment 07/09/21.

## 2021-06-20 ENCOUNTER — Telehealth: Payer: Self-pay | Admitting: Family Medicine

## 2021-06-20 DIAGNOSIS — I10 Essential (primary) hypertension: Secondary | ICD-10-CM

## 2021-06-20 DIAGNOSIS — K5900 Constipation, unspecified: Secondary | ICD-10-CM

## 2021-06-20 NOTE — Telephone Encounter (Signed)
Medication Refill - Medication:linzess, bisoprolol (ZEBETA) 10 MG tablet and farxiga for 30 tablets, pharmacy says cant split in half  Has the patient contacted their pharmacy? yes (Agent: If no, request that the patient contact the pharmacy for the refill.) (Agent: If yes, when and what did the pharmacy advise?)contact pcp  Preferred Pharmacy (with phone number or street name):  [place], [place] Phone:  [phone]  Fax:  [phone]      Agent: Please be advised that RX refills may take up to 3 business days. We ask that you follow-up with your pharmacy.

## 2021-06-20 NOTE — Telephone Encounter (Signed)
Notes to clinic:  Patient has appt on 07/09/2021 Would like 30 tabs and states pharmacy can split in half   Requested Prescriptions  Pending Prescriptions Disp Refills   linaclotide (LINZESS) 290 MCG CAPS capsule 30 capsule 0    Sig: Take 1 capsule (290 mcg total) by mouth daily before breakfast. OFFICE VISIT NEEDED FOR ADDITIONAL REFILLS     Gastroenterology: Irritable Bowel Syndrome Passed - 06/20/2021  1:42 PM      Passed - Valid encounter within last 12 months    Recent Outpatient Visits           6 months ago Uncontrolled type 2 diabetes mellitus with hyperglycemia [facility])   [place], DO   8 months ago Encounter to establish care with new doctor   [facility], Lupita Raider, FNP       Future Appointments             In 2 weeks Garnette Gunner, Coralie Keens, NP [facility], [place]   In 1 month MacDiarmid, Nicki Reaper, MD [place]   In 6 months  [facility], [facility]             bisoprolol (ZEBETA) 10 MG tablet 30 tablet 0    Sig: Take 1 tablet (10 mg total) by mouth daily. OFFICE VISIT NEEDED FOR ADDITIONAL REFILLS     Cardiovascular:  Beta Blockers Passed - 06/20/2021  1:42 PM      Passed - Last BP in normal range    BP Readings from Last 1 Encounters:  12/17/20 134/72          Passed - Last Heart Rate in normal range    Pulse Readings from Last 1 Encounters:  12/17/20 80          Passed - Valid encounter within last 6 months    Recent Outpatient Visits           6 months ago Uncontrolled type 2 diabetes mellitus with hyperglycemia [facility])   [facility], Devonne Doughty, DO   8 months ago Encounter to establish care with new doctor   [facility], Lupita Raider, FNP       Future Appointments             In 2 weeks Garnette Gunner, Coralie Keens, NP [facility], [place]   In 1 month MacDiarmid, Nicki Reaper, MD [place]   In 6 months  [facility], [facility]             dapagliflozin propanediol (FARXIGA) 5 MG TABS tablet 30 tablet 0    Sig: Take 1 tablet (5 mg total) by mouth daily. OFFICE VISIT NEEDED FOR ADDITIONAL REFILLS     Endocrinology:  Diabetes - SGLT2 Inhibitors Failed - 06/20/2021  1:42 PM      Failed - Cr in normal range and within 360 days    Creatinine, Ser  Date Value Ref Range Status  10/08/2020 1.06 (H) 0.44 - 1.00 mg/dL Final          Failed - LDL in normal range and within 360 days    No results found for: LDLCALC, LDLC, HIRISKLDL, POCLDL, LDLDIRECT, REALLDLC, TOTLDLC        Failed - HBA1C is between 0 and 7.9 and within 180 days    Hemoglobin A1C  Date Value Ref Range Status  10/21/2020 8.5 (A) 4.0 - 5.6 %  Final          Failed - AA eGFR in normal range and within 360 days    GFR calc Af Amer  Date Value Ref Range Status  08/03/2020 59 (L) >60 mL/min Final   GFR, Estimated  Date Value Ref Range Status  10/08/2020 58 (L) >60 mL/min Final    Comment:    (NOTE) Calculated using the CKD-EPI Creatinine Equation (2021)           Passed - Valid encounter within last 6 months    Recent Outpatient Visits           6 months ago Uncontrolled type 2 diabetes mellitus with hyperglycemia [facility])   [place], DO   8 months ago Encounter to establish care with new doctor   [place], FNP       Future Appointments             In 2 weeks Garnette Gunner, Coralie Keens, NP [facility], [place]   In 1 month MacDiarmid, Nicki Reaper, [place]   In 6 months  [facility], [facility]

## 2021-06-23 NOTE — Telephone Encounter (Signed)
[name] at [facility] would like the nurse to call her back regarding some refills for the patient

## 2021-06-24 ENCOUNTER — Encounter: Payer: Self-pay | Admitting: Family Medicine

## 2021-06-24 ENCOUNTER — Telehealth: Payer: Self-pay | Admitting: Internal Medicine

## 2021-06-24 ENCOUNTER — Ambulatory Visit: Payer: Medicare HMO | Admitting: Internal Medicine

## 2021-06-24 ENCOUNTER — Other Ambulatory Visit: Payer: Self-pay | Admitting: Internal Medicine

## 2021-06-24 MED ORDER — "INSULIN SYRINGE 31G X 5/16"" 1 ML MISC": 1.0000 | Freq: Four times a day (QID) | 3 refills | Status: DC

## 2021-06-24 NOTE — Telephone Encounter (Signed)
Pt called in stating the Pharmacy told her to get PC P to send over a new script, for a 30 day supply instead of 15 day supply because with a 15 day they do not want to break the seal. Please advise.   Insulin Pen Needle (BD PEN NEEDLE NANO U/F) 32G X 4 MM MISC Insulin Syringe-Needle U-100 (INSULIN SYRINGE 1CC/31GX5/16") 31G X 5/16" 1 ML MISC LANTUS 100 UNIT/ML injection   [place], [place] - [place], [place] [address]  Phone:  [phone]  Fax:  [phone]

## 2021-06-24 NOTE — Addendum Note (Signed)
Addended by: Jearld Fenton on: 06/24/2021 02:22 PM   Modules accepted: Orders

## 2021-06-24 NOTE — Telephone Encounter (Signed)
Done

## 2021-06-24 NOTE — Telephone Encounter (Deleted)
None

## 2021-06-24 NOTE — Telephone Encounter (Signed)
ERROR

## 2021-06-25 ENCOUNTER — Other Ambulatory Visit: Payer: Self-pay

## 2021-06-25 DIAGNOSIS — E1165 Type 2 diabetes mellitus with hyperglycemia: Secondary | ICD-10-CM

## 2021-06-25 DIAGNOSIS — I10 Essential (primary) hypertension: Secondary | ICD-10-CM

## 2021-06-25 DIAGNOSIS — K5900 Constipation, unspecified: Secondary | ICD-10-CM

## 2021-06-25 MED ORDER — BISOPROLOL FUMARATE 10 MG PO TABS: 10.0000 mg | ORAL_TABLET | Freq: Every day | ORAL | 0 refills | Status: DC

## 2021-06-25 MED ORDER — LINACLOTIDE 290 MCG PO CAPS: 290.0000 ug | ORAL_CAPSULE | Freq: Every day | ORAL | 0 refills | Status: DC

## 2021-06-25 MED ORDER — DAPAGLIFLOZIN PROPANEDIOL 5 MG PO TABS: 5.0000 mg | ORAL_TABLET | Freq: Every day | ORAL | 0 refills | Status: DC

## 2021-06-25 NOTE — Telephone Encounter (Signed)
I spoke with Rachel Nielsen from [place] about the patient prescription concerns. I sent over the Farxiga, Linzess, and Bisoprolol over for a 30 day script because of the issue of not being able to send 15 day prescription. I express to the pharmacy to make sure the bottle state no more refills without an appointment.

## 2021-06-30 DIAGNOSIS — R0600 Dyspnea, unspecified: Secondary | ICD-10-CM | POA: Diagnosis not present

## 2021-06-30 DIAGNOSIS — I10 Essential (primary) hypertension: Secondary | ICD-10-CM | POA: Diagnosis not present

## 2021-06-30 DIAGNOSIS — Z8673 Personal history of transient ischemic attack (TIA), and cerebral infarction without residual deficits: Secondary | ICD-10-CM | POA: Diagnosis not present

## 2021-07-01 ENCOUNTER — Telehealth: Payer: Self-pay

## 2021-07-01 DIAGNOSIS — G4733 Obstructive sleep apnea (adult) (pediatric): Secondary | ICD-10-CM | POA: Diagnosis not present

## 2021-07-01 DIAGNOSIS — J4520 Mild intermittent asthma, uncomplicated: Secondary | ICD-10-CM | POA: Diagnosis not present

## 2021-07-01 DIAGNOSIS — R0609 Other forms of dyspnea: Secondary | ICD-10-CM | POA: Diagnosis not present

## 2021-07-01 NOTE — Telephone Encounter (Signed)
Received a call from the patient saying that a PA needed to be completed for a refill of tramadol.   After looking into her chart history, we have never prescribed that in the past.  Dr. Melrose Nakayama at [facility] handles that prescription.   I advised her to call their office to see about getting that completed.   Pt agreed to call them.

## 2021-07-03 DIAGNOSIS — F419 Anxiety disorder, unspecified: Secondary | ICD-10-CM | POA: Diagnosis not present

## 2021-07-03 DIAGNOSIS — F321 Major depressive disorder, single episode, moderate: Secondary | ICD-10-CM | POA: Diagnosis not present

## 2021-07-08 DIAGNOSIS — M542 Cervicalgia: Secondary | ICD-10-CM | POA: Diagnosis not present

## 2021-07-08 DIAGNOSIS — R200 Anesthesia of skin: Secondary | ICD-10-CM | POA: Diagnosis not present

## 2021-07-08 DIAGNOSIS — R531 Weakness: Secondary | ICD-10-CM | POA: Diagnosis not present

## 2021-07-08 DIAGNOSIS — M545 Low back pain, unspecified: Secondary | ICD-10-CM | POA: Diagnosis not present

## 2021-07-08 DIAGNOSIS — Z8673 Personal history of transient ischemic attack (TIA), and cerebral infarction without residual deficits: Secondary | ICD-10-CM | POA: Diagnosis not present

## 2021-07-08 DIAGNOSIS — R202 Paresthesia of skin: Secondary | ICD-10-CM | POA: Diagnosis not present

## 2021-07-08 DIAGNOSIS — G8929 Other chronic pain: Secondary | ICD-10-CM | POA: Diagnosis not present

## 2021-07-09 ENCOUNTER — Ambulatory Visit (INDEPENDENT_AMBULATORY_CARE_PROVIDER_SITE_OTHER): Payer: Medicare HMO | Admitting: Internal Medicine

## 2021-07-09 ENCOUNTER — Encounter: Payer: Self-pay | Admitting: Internal Medicine

## 2021-07-09 ENCOUNTER — Other Ambulatory Visit: Payer: Self-pay

## 2021-07-09 VITALS — BP 140/78 | HR 72 | Temp 97.3°F | Resp 20 | Ht 64.0 in | Wt 223.4 lb

## 2021-07-09 DIAGNOSIS — E1165 Type 2 diabetes mellitus with hyperglycemia: Secondary | ICD-10-CM | POA: Diagnosis not present

## 2021-07-09 DIAGNOSIS — Z8673 Personal history of transient ischemic attack (TIA), and cerebral infarction without residual deficits: Secondary | ICD-10-CM

## 2021-07-09 DIAGNOSIS — F411 Generalized anxiety disorder: Secondary | ICD-10-CM

## 2021-07-09 DIAGNOSIS — Z6838 Body mass index (BMI) 38.0-38.9, adult: Secondary | ICD-10-CM

## 2021-07-09 DIAGNOSIS — E1169 Type 2 diabetes mellitus with other specified complication: Secondary | ICD-10-CM

## 2021-07-09 DIAGNOSIS — E66812 Obesity, class 2: Secondary | ICD-10-CM

## 2021-07-09 DIAGNOSIS — E1142 Type 2 diabetes mellitus with diabetic polyneuropathy: Secondary | ICD-10-CM | POA: Diagnosis not present

## 2021-07-09 DIAGNOSIS — F331 Major depressive disorder, recurrent, moderate: Secondary | ICD-10-CM

## 2021-07-09 DIAGNOSIS — M5412 Radiculopathy, cervical region: Secondary | ICD-10-CM | POA: Insufficient documentation

## 2021-07-09 DIAGNOSIS — I10 Essential (primary) hypertension: Secondary | ICD-10-CM

## 2021-07-09 DIAGNOSIS — J432 Centrilobular emphysema: Secondary | ICD-10-CM

## 2021-07-09 DIAGNOSIS — Z6837 Body mass index (BMI) 37.0-37.9, adult: Secondary | ICD-10-CM | POA: Insufficient documentation

## 2021-07-09 DIAGNOSIS — K219 Gastro-esophageal reflux disease without esophagitis: Secondary | ICD-10-CM | POA: Diagnosis not present

## 2021-07-09 DIAGNOSIS — E782 Mixed hyperlipidemia: Secondary | ICD-10-CM

## 2021-07-09 DIAGNOSIS — E6609 Other obesity due to excess calories: Secondary | ICD-10-CM | POA: Insufficient documentation

## 2021-07-09 DIAGNOSIS — Z23 Encounter for immunization: Secondary | ICD-10-CM

## 2021-07-09 DIAGNOSIS — G4733 Obstructive sleep apnea (adult) (pediatric): Secondary | ICD-10-CM

## 2021-07-09 DIAGNOSIS — K5909 Other constipation: Secondary | ICD-10-CM

## 2021-07-09 DIAGNOSIS — M5416 Radiculopathy, lumbar region: Secondary | ICD-10-CM

## 2021-07-09 MED ORDER — BISOPROLOL FUMARATE 10 MG PO TABS: 10.0000 mg | ORAL_TABLET | Freq: Every day | ORAL | 1 refills | Status: DC

## 2021-07-09 MED ORDER — AMLODIPINE BESYLATE 5 MG PO TABS: 5.0000 mg | ORAL_TABLET | Freq: Every day | ORAL | 1 refills | Status: DC

## 2021-07-09 MED ORDER — OMEPRAZOLE 20 MG PO CPDR: 20.0000 mg | DELAYED_RELEASE_CAPSULE | Freq: Every day | ORAL | 1 refills | Status: DC

## 2021-07-09 NOTE — Assessment & Plan Note (Signed)
Encouraged diet for weight loss 

## 2021-07-09 NOTE — Assessment & Plan Note (Signed)
Not on preventative therapy Continue Albuterol as needed

## 2021-07-09 NOTE — Assessment & Plan Note (Signed)
She is currently being worked up by neurology for this Has been referred to PT and physiatry

## 2021-07-09 NOTE — Assessment & Plan Note (Signed)
Controlled on Amlodipine and Bisoprolol, refilled today Reinforced DASH diet and exercise for weight loss

## 2021-07-09 NOTE — Assessment & Plan Note (Signed)
Continue Rosuvastatin and Ezetimibe Encouraged her to consume a low fat diet

## 2021-07-09 NOTE — Assessment & Plan Note (Signed)
Deteriorated Encouraged weight loss as this can help reduce reflux symptoms D/c Famotidine RX for Omeprazole 20 mg daily

## 2021-07-09 NOTE — Assessment & Plan Note (Signed)
Uncontrolled Will check urine microalbumin at annual exam Encouraged her to consume a low carb diet and exercise for weight loss Continue Lantus, Humalog, Trulicity, Farxiga I'm a little concerned that she is taking Gabapentin and Pregabalin- will discuss this with my supervising  Physician Encouraged routine eye exams Encouraged routine foot exams Encouraged her to get a flu shot in the fall Prevnar 20 today Will get pneumovax in 1 year

## 2021-07-09 NOTE — Progress Notes (Signed)
Subjective:    Patient ID: Rachel Nielsen, female    DOB: 1951/04/22, 70 y.o.   MRN: [id number]  HPI  Patient presents to clinic today for follow-up of chronic conditions.  HTN: Her BP today is 140/78.  She is taking Amlodipine and Bisoprolol as prescribed.  ECG from 10/2020 reviewed.  HLD with History of Stroke: Her last LDL was 30, triglycerides 70, 01/2021.  She denies myalgias on Rosuvastatin and Eztimibe  She tries to consume a low-fat diet.  GERD: Triggered by everything.  She reports breakthrough on Famotidine.  Upper GI from 07/2018 reviewed.  DM2 with Peripheral Neuropathy: Her last A1c was 11.7, 05/2021.  She is taking Lantus, Humalog, Trulicity, Farxiga and Gabapentin as prescribed.  She was recently started on Pregabalin by neurology. Her sugars range 148-310.  She checks her feet routinely.  Her last eye exam was 04/2021.  Flu 10/2020.  Pneumovax never.  Prevnar never.  COVID never.  She follows with endocrinology.  Anxiety and Depression: Chronic, managed on Duloxetine, Latuda, Wellbutrin, BuSpar and Xanax.  She is currently seeing a therapist.  She does follow with psychiatry.  She denies SI/HI.  COPD: She denies chronic cough but does have shortness of breath.  She is taking Albuterol as prescribed.  There are no PFTs on file.  OSA: She averages 7 hours of sleep per night without the use of her CPAP.  Sleep study from 12/2017 reviewed.  Chronic Constipation: Managed with Linzess.  Colonoscopy from 07/2018 reviewed.  Cervical/Lumbar Radiculopathy: Mainly on the right. She was on Gabapentin, recently started on Pregabalin and Tramadol by neurology. She has recently been referred to physiatry and PT by neurology.  Review of Systems     Past Medical History:  Diagnosis Date   Allergy    Anxiety    Asthma    Colon polyps    Constipation    COPD (chronic obstructive pulmonary disease) (HCC)    Depression    GERD (gastroesophageal reflux disease)    Glaucoma    Heart  attack ([place])    Hypertension    Sleep apnea    Sleep apnea    Stroke [facility])    Urinary incontinence     Current Outpatient Medications  Medication Sig Dispense Refill   Accu-Chek FastClix Lancets MISC Use to check blood sugar up to twice a day as directed 102 each 5   ACCU-CHEK GUIDE test strip Use to check blood sugar up to twice a day as directed 100 strip 5   albuterol (VENTOLIN HFA) 108 (90 Base) MCG/ACT inhaler Inhale 2-4 puffs by mouth every 4 hours as needed for wheezing, cough, and/or shortness of breath 6.7 g 0   ALPRAZolam (XANAX) 0.5 MG tablet Take 0.5-1 mg by mouth See admin instructions. Take 1 tablet (0.'5mg'$ ) by mouth twice daily and 2 tablets ('1mg'$ ) by mouth at bedtime     amLODipine (NORVASC) 5 MG tablet Take 1 tablet (5 mg total) by mouth daily. 30 tablet 3   bisoprolol (ZEBETA) 10 MG tablet Take 1 tablet (10 mg total) by mouth daily. OFFICE VISIT NEEDED FOR ADDITIONAL REFILLS 30 tablet 0   buPROPion (WELLBUTRIN XL) 300 MG 24 hr tablet Take 1 tablet (300 mg total) by mouth daily. 30 tablet 0   busPIRone (BUSPAR) 15 MG tablet Take 15 mg by mouth 2 (two) times daily.      dapagliflozin propanediol (FARXIGA) 5 MG TABS tablet Take 1 tablet (5 mg total) by mouth daily. OFFICE VISIT NEEDED  FOR ADDITIONAL REFILLS 30 tablet 0   Dulaglutide (TRULICITY) 1.5 0000000 SOPN inject 1.5 mg once weekly for diabetes 2 mL 1   DULoxetine (CYMBALTA) 60 MG capsule Take 60 mg by mouth daily.      famotidine (PEPCID) 20 MG tablet Take 20 mg by mouth 2 (two) times daily.     fluticasone (FLONASE) 50 MCG/ACT nasal spray Place 1 spray into both nostrils daily. 16 g 0   fluticasone-salmeterol (WIXELA INHUB) 250-50 MCG/ACT AEPB Inhale 1 puff into the lungs in the morning and at bedtime. 1 each 2   Fluticasone-Salmeterol (WIXELA INHUB) 250-50 MCG/DOSE AEPB INHALE 1 PUFF TWICE DAILY. RINSE MOUTH AFTER USE. (Patient not taking: Reported on 12/31/2020) 180 each 1   gabapentin (NEURONTIN) 600 MG tablet Take 300  mg in the morning and 600 mg at night     GLOBAL EASE INJECT PEN NEEDLES 32G X 4 MM MISC Use as directed with insulin 100 each 0   insulin lispro (HUMALOG) 100 UNIT/ML injection Inject 0.12 mLs (12 Units total) into the skin 3 (three) times daily before meals. 10 mL 11   Insulin Pen Needle (BD PEN NEEDLE NANO U/F) 32G X 4 MM MISC Inject 1 Device into the skin daily. 100 each 3   Insulin Syringe-Needle U-100 (INSULIN SYRINGE 1CC/31GX5/16") 31G X 5/16" 1 ML MISC 1 Syringe by Does not apply route in the morning, at noon, in the evening, and at bedtime. 200 each 3   LANTUS 100 UNIT/ML injection Inject 0.32 mLs (32 Units total) into the skin daily. 10 mL 0   LATUDA 40 MG TABS tablet Take 40 mg by mouth at bedtime.     linaclotide (LINZESS) 290 MCG CAPS capsule Take 1 capsule (290 mcg total) by mouth daily before breakfast. OFFICE VISIT NEEDED FOR ADDITIONAL REFILLS 30 capsule 0   meloxicam (MOBIC) 15 MG tablet Take 1 tablet (15 mg total) by mouth at bedtime. 30 tablet 0   montelukast (SINGULAIR) 10 MG tablet Take 1 tablet (10 mg total) by mouth daily. 30 tablet 0   Olopatadine HCl 0.2 % SOLN Place 1 drop into both eyes daily. 2.5 mL 0   rosuvastatin (CRESTOR) 10 MG tablet Take 1 tablet (10 mg total) by mouth daily. 90 tablet 1   TRAVATAN Z 0.004 % SOLN ophthalmic solution Place 1 drop into both eyes at bedtime. 5 mL 0   No current facility-administered medications for this visit.    Allergies  Allergen Reactions   Ace Inhibitors Cough    Family History  Problem Relation Age of Onset   Hypertension Mother    Atrial fibrillation Mother    Heart attack Mother    Hypertension Father    Stomach cancer Father    Diabetes Father    Liver cancer Father    Diabetes Sister    Hypertension Sister     Social History   Socioeconomic History   Marital status: Legally Separated    Spouse name: Not on file   Number of children: Not on file   Years of education: Not on file   Highest education  level: Not on file  Occupational History   Occupation: retired  Tobacco Use   Smoking status: Former   Smokeless tobacco: Never   Tobacco comments:    4 months ago  Scientific laboratory technician Use: Some days  Substance and Sexual Activity   Alcohol use: Not Currently    Comment: monthly or less    Drug use:  No   Sexual activity: Not on file  Other Topics Concern   Not on file  Social History Narrative   Not on file   Social Determinants of Health   Financial Resource Strain: Low Risk    Difficulty of Paying Living Expenses: Not hard at all  Food Insecurity: No Food Insecurity   Worried About Running Out of Food in the Last Year: Never true   [place] in the Last Year: Never true  Transportation Needs: No Transportation Needs   Lack of Transportation (Medical): No   Lack of Transportation (Non-Medical): No  Physical Activity: Inactive   Days of Exercise per Week: 0 days   Minutes of Exercise per Session: 0 min  Stress: No Stress Concern Present   Feeling of Stress : Not at all  Social Connections: Not on file  Intimate Partner Violence: Not on file     Constitutional: Denies fever, malaise, fatigue, headache or abrupt weight changes.  HEENT: Denies eye pain, eye redness, ear pain, ringing in the ears, wax buildup, runny nose, nasal congestion, bloody nose, or sore throat. Respiratory: Pt reports shortness of breath. Denies difficulty breathing, cough or sputum production.   Cardiovascular: Pt reports intermittent swelling in her feet. Denies chest pain, chest tightness, palpitations or swelling in the hands.  Gastrointestinal: Patient reports chronic constipation, worsening reflux.  Denies abdominal pain, bloating, diarrhea or blood in the stool.  GU: Denies urgency, frequency, pain with urination, burning sensation, blood in urine, odor or discharge. Musculoskeletal: Pt reports difficulty with gait, neck pain, low back pain. Denies muscle pain or joint swelling.  Skin:  Denies redness, rashes, lesions or ulcercations.  Neurological: Pt reports numbness and tingling in her right arm and right leg. Denies dizziness, difficulty with memory, difficulty with speech or problems with coordination.  Psych: Patient has a history of anxiety and depression.  Denies SI/HI.  No other specific complaints in a complete review of systems (except as listed in HPI above).  Objective:   Physical Exam   BP 140/78 (BP Location: Right Arm, Patient Position: Sitting, Cuff Size: Normal)   Pulse 72   Temp (!) 97.3 F (36.3 C) (Temporal)   Resp 20   Ht '5\' 4"'$  (1.626 m)   Wt 223 lb 6.4 oz (101.3 kg)   SpO2 100%   BMI 38.35 kg/m   Wt Readings from Last 3 Encounters:  12/31/20 223 lb (101.2 kg)  12/17/20 221 lb 9.6 oz (100.5 kg)  10/21/20 219 lb (99.3 kg)    General: Appears her stated age, obese, in NAD. Skin: Warm, dry and intact. No ulcerations noted. HEENT: Head: normal shape and size; Eyes: sclera white and EOMs intact;  Neck:  No JVD noted. Cardiovascular: Normal rate and rhythm. S1,S2 noted.  No murmur, rubs or gallops noted. No JVD or BLE edema. No carotid bruits noted. Pulmonary/Chest: Normal effort and positive vesicular breath sounds. No respiratory distress. No wheezes, rales or ronchi noted.  Abdomen: Soft and nontender.  Musculoskeletal: Gait slow and steady with use of walker. Neurological: Alert and oriented.  Psychiatric: Mood and affect normal. Behavior is normal. Judgment and thought content normal.   BMET    Component Value Date/Time   NA 137 10/08/2020 1511   NA 141 10/19/2019 1323   K 4.2 10/08/2020 1511   CL 103 10/08/2020 1511   CO2 27 10/08/2020 1511   GLUCOSE 295 (H) 10/08/2020 1511   BUN 22 10/08/2020 1511   BUN 19 10/19/2019 1323  CREATININE 1.06 (H) 10/08/2020 1511   CALCIUM 9.5 10/08/2020 1511   GFRNONAA 58 (L) 10/08/2020 1511   GFRAA 59 (L) 08/03/2020 0637    CBC    Component Value Date/Time   WBC 11.7 (H) 10/08/2020 1511    RBC 4.89 10/08/2020 1511   HGB 12.5 10/08/2020 1511   HCT 41.2 10/08/2020 1511   PLT 178 10/08/2020 1511   MCV 84.3 10/08/2020 1511   MCH 25.6 (L) 10/08/2020 1511   MCHC 30.3 10/08/2020 1511   RDW 16.8 (H) 10/08/2020 1511   LYMPHSABS 3.3 09/21/2020 1841   MONOABS 0.9 09/21/2020 1841   EOSABS 0.4 09/21/2020 1841   BASOSABS 0.1 09/21/2020 1841    Hgb A1C Lab Results  Component Value Date   HGBA1C 8.5 (A) 10/21/2020           Assessment & Plan:    Webb Silversmith, NP This visit occurred during the SARS-CoV-2 public health emergency.  Safety protocols were in place, including screening questions prior to the visit, additional usage of staff PPE, and extensive cleaning of exam room while observing appropriate contact time as indicated for disinfecting solutions.

## 2021-07-09 NOTE — Assessment & Plan Note (Signed)
Currently on Gabapentin and Pregabalin, will discuss this with my supervising physician

## 2021-07-09 NOTE — Assessment & Plan Note (Signed)
Continue Rosuvastatin and Ezetimibe Encouraged her to consume a low fat diet Discussed the importance of good blood sugar control

## 2021-07-09 NOTE — Patient Instructions (Signed)

## 2021-07-09 NOTE — Assessment & Plan Note (Signed)
Encouraged weight loss as this can help reduce sleep apnea symptoms Not currently using a CPAP

## 2021-07-09 NOTE — Assessment & Plan Note (Signed)
Continue Linzess Encouraged high fiber diet and adequate water intake 

## 2021-07-15 DIAGNOSIS — I69354 Hemiplegia and hemiparesis following cerebral infarction affecting left non-dominant side: Secondary | ICD-10-CM | POA: Diagnosis not present

## 2021-07-15 DIAGNOSIS — F32A Depression, unspecified: Secondary | ICD-10-CM | POA: Diagnosis not present

## 2021-07-15 DIAGNOSIS — F419 Anxiety disorder, unspecified: Secondary | ICD-10-CM | POA: Diagnosis not present

## 2021-07-15 DIAGNOSIS — E119 Type 2 diabetes mellitus without complications: Secondary | ICD-10-CM | POA: Diagnosis not present

## 2021-07-15 DIAGNOSIS — G8929 Other chronic pain: Secondary | ICD-10-CM | POA: Diagnosis not present

## 2021-07-15 DIAGNOSIS — F321 Major depressive disorder, single episode, moderate: Secondary | ICD-10-CM | POA: Diagnosis not present

## 2021-07-15 DIAGNOSIS — M1990 Unspecified osteoarthritis, unspecified site: Secondary | ICD-10-CM | POA: Diagnosis not present

## 2021-07-15 DIAGNOSIS — J449 Chronic obstructive pulmonary disease, unspecified: Secondary | ICD-10-CM | POA: Diagnosis not present

## 2021-07-15 DIAGNOSIS — M5412 Radiculopathy, cervical region: Secondary | ICD-10-CM | POA: Diagnosis not present

## 2021-07-15 DIAGNOSIS — I10 Essential (primary) hypertension: Secondary | ICD-10-CM | POA: Diagnosis not present

## 2021-07-16 ENCOUNTER — Other Ambulatory Visit: Payer: Self-pay | Admitting: Internal Medicine

## 2021-07-18 ENCOUNTER — Other Ambulatory Visit: Payer: Self-pay | Admitting: Internal Medicine

## 2021-07-18 DIAGNOSIS — I10 Essential (primary) hypertension: Secondary | ICD-10-CM | POA: Diagnosis not present

## 2021-07-18 DIAGNOSIS — F419 Anxiety disorder, unspecified: Secondary | ICD-10-CM | POA: Diagnosis not present

## 2021-07-18 DIAGNOSIS — I69354 Hemiplegia and hemiparesis following cerebral infarction affecting left non-dominant side: Secondary | ICD-10-CM | POA: Diagnosis not present

## 2021-07-18 DIAGNOSIS — G8929 Other chronic pain: Secondary | ICD-10-CM | POA: Diagnosis not present

## 2021-07-18 DIAGNOSIS — E1165 Type 2 diabetes mellitus with hyperglycemia: Secondary | ICD-10-CM

## 2021-07-18 DIAGNOSIS — E119 Type 2 diabetes mellitus without complications: Secondary | ICD-10-CM | POA: Diagnosis not present

## 2021-07-18 DIAGNOSIS — F32A Depression, unspecified: Secondary | ICD-10-CM | POA: Diagnosis not present

## 2021-07-18 DIAGNOSIS — M5412 Radiculopathy, cervical region: Secondary | ICD-10-CM | POA: Diagnosis not present

## 2021-07-18 DIAGNOSIS — J449 Chronic obstructive pulmonary disease, unspecified: Secondary | ICD-10-CM | POA: Diagnosis not present

## 2021-07-18 DIAGNOSIS — M1990 Unspecified osteoarthritis, unspecified site: Secondary | ICD-10-CM | POA: Diagnosis not present

## 2021-07-18 NOTE — Telephone Encounter (Signed)
Requested medication (s) are due for refill today: Yes  Requested medication (s) are on the active medication list: Yes  Last refill:  06/19/21  Future visit scheduled: Yes  Notes to clinic:  Unable to refill per protocol, Sig is different than what patient reports taking, routing to provider for clarification      Requested Prescriptions  Pending Prescriptions Disp Refills   LANTUS 100 UNIT/ML injection [Pharmacy Med Name: LANTUS 100 UNIT/ML VIAL]  0    Sig: Inject 0.32 mLs (32 Units total) into the skin daily.     Endocrinology:  Diabetes - Insulins Failed - 07/18/2021 11:48 AM      Failed - HBA1C is between 0 and 7.9 and within 180 days    Hemoglobin A1C  Date Value Ref Range Status  05/14/2021 11.7  Final          Passed - Valid encounter within last 6 months    Recent Outpatient Visits           1 week ago Mixed diabetic hyperlipidemia associated with type 2 diabetes mellitus ([place])   [facility] [place], Coralie Keens, NP   7 months ago Uncontrolled type 2 diabetes mellitus with hyperglycemia [facility])   [facility] Olin Hauser, DO   9 months ago Encounter to establish care with new doctor   [facility], Lupita Raider, FNP       Future Appointments             In 1 week Bjorn Loser, MD [place]   In 5 months  [facility], [place]   In 5 months [place], Coralie Keens, NP [facility], [facility]            Signed Prescriptions Disp Refills   fluticasone (FLONASE) 50 MCG/ACT nasal spray 16 g 2    Sig: Place 1 spray into both nostrils daily.     Ear, Nose, and Throat: Nasal Preparations - Corticosteroids Passed - 07/18/2021 11:48 AM      Passed - Valid encounter within last 12 months    Recent Outpatient Visits           1 week ago Mixed diabetic hyperlipidemia associated with type 2 diabetes mellitus [facility])   [facility] [place], Coralie Keens, NP   7  months ago Uncontrolled type 2 diabetes mellitus with hyperglycemia [facility])   [place], DO   9 months ago Encounter to establish care with new doctor   [facility], Lupita Raider, FNP       Future Appointments             In 1 week MacDiarmid, Nicki Reaper, MD [place]   In 5 months  [facility], [place]   In 5 months [place], Coralie Keens, NP [facility], [facility]

## 2021-07-21 DIAGNOSIS — F32A Depression, unspecified: Secondary | ICD-10-CM | POA: Diagnosis not present

## 2021-07-21 DIAGNOSIS — I69354 Hemiplegia and hemiparesis following cerebral infarction affecting left non-dominant side: Secondary | ICD-10-CM | POA: Diagnosis not present

## 2021-07-21 DIAGNOSIS — E119 Type 2 diabetes mellitus without complications: Secondary | ICD-10-CM | POA: Diagnosis not present

## 2021-07-21 DIAGNOSIS — F419 Anxiety disorder, unspecified: Secondary | ICD-10-CM | POA: Diagnosis not present

## 2021-07-21 DIAGNOSIS — J449 Chronic obstructive pulmonary disease, unspecified: Secondary | ICD-10-CM | POA: Diagnosis not present

## 2021-07-21 DIAGNOSIS — M5412 Radiculopathy, cervical region: Secondary | ICD-10-CM | POA: Diagnosis not present

## 2021-07-21 DIAGNOSIS — I10 Essential (primary) hypertension: Secondary | ICD-10-CM | POA: Diagnosis not present

## 2021-07-21 DIAGNOSIS — M1990 Unspecified osteoarthritis, unspecified site: Secondary | ICD-10-CM | POA: Diagnosis not present

## 2021-07-21 DIAGNOSIS — G8929 Other chronic pain: Secondary | ICD-10-CM | POA: Diagnosis not present

## 2021-07-23 ENCOUNTER — Other Ambulatory Visit: Payer: Self-pay | Admitting: Internal Medicine

## 2021-07-23 DIAGNOSIS — K5900 Constipation, unspecified: Secondary | ICD-10-CM

## 2021-07-23 DIAGNOSIS — M1990 Unspecified osteoarthritis, unspecified site: Secondary | ICD-10-CM | POA: Diagnosis not present

## 2021-07-23 DIAGNOSIS — J449 Chronic obstructive pulmonary disease, unspecified: Secondary | ICD-10-CM | POA: Diagnosis not present

## 2021-07-23 DIAGNOSIS — E119 Type 2 diabetes mellitus without complications: Secondary | ICD-10-CM | POA: Diagnosis not present

## 2021-07-23 DIAGNOSIS — E1165 Type 2 diabetes mellitus with hyperglycemia: Secondary | ICD-10-CM

## 2021-07-23 DIAGNOSIS — G8929 Other chronic pain: Secondary | ICD-10-CM | POA: Diagnosis not present

## 2021-07-23 DIAGNOSIS — F32A Depression, unspecified: Secondary | ICD-10-CM | POA: Diagnosis not present

## 2021-07-23 DIAGNOSIS — F419 Anxiety disorder, unspecified: Secondary | ICD-10-CM | POA: Diagnosis not present

## 2021-07-23 DIAGNOSIS — I69354 Hemiplegia and hemiparesis following cerebral infarction affecting left non-dominant side: Secondary | ICD-10-CM | POA: Diagnosis not present

## 2021-07-23 DIAGNOSIS — I10 Essential (primary) hypertension: Secondary | ICD-10-CM | POA: Diagnosis not present

## 2021-07-23 DIAGNOSIS — M5412 Radiculopathy, cervical region: Secondary | ICD-10-CM | POA: Diagnosis not present

## 2021-07-23 NOTE — Telephone Encounter (Signed)
Per OV- to continue both these medications

## 2021-07-24 DIAGNOSIS — E119 Type 2 diabetes mellitus without complications: Secondary | ICD-10-CM | POA: Diagnosis not present

## 2021-07-24 DIAGNOSIS — G8929 Other chronic pain: Secondary | ICD-10-CM | POA: Diagnosis not present

## 2021-07-24 DIAGNOSIS — F32A Depression, unspecified: Secondary | ICD-10-CM | POA: Diagnosis not present

## 2021-07-24 DIAGNOSIS — J449 Chronic obstructive pulmonary disease, unspecified: Secondary | ICD-10-CM | POA: Diagnosis not present

## 2021-07-24 DIAGNOSIS — M1990 Unspecified osteoarthritis, unspecified site: Secondary | ICD-10-CM | POA: Diagnosis not present

## 2021-07-24 DIAGNOSIS — I69354 Hemiplegia and hemiparesis following cerebral infarction affecting left non-dominant side: Secondary | ICD-10-CM | POA: Diagnosis not present

## 2021-07-24 DIAGNOSIS — M5412 Radiculopathy, cervical region: Secondary | ICD-10-CM | POA: Diagnosis not present

## 2021-07-24 DIAGNOSIS — I10 Essential (primary) hypertension: Secondary | ICD-10-CM | POA: Diagnosis not present

## 2021-07-24 DIAGNOSIS — F419 Anxiety disorder, unspecified: Secondary | ICD-10-CM | POA: Diagnosis not present

## 2021-07-28 ENCOUNTER — Ambulatory Visit (INDEPENDENT_AMBULATORY_CARE_PROVIDER_SITE_OTHER): Payer: Medicare HMO | Admitting: Urology

## 2021-07-28 ENCOUNTER — Encounter: Payer: Self-pay | Admitting: Urology

## 2021-07-28 ENCOUNTER — Other Ambulatory Visit: Payer: Self-pay

## 2021-07-28 VITALS — BP 145/85 | HR 92 | Ht 64.0 in

## 2021-07-28 DIAGNOSIS — R32 Unspecified urinary incontinence: Secondary | ICD-10-CM | POA: Diagnosis not present

## 2021-07-28 LAB — URINALYSIS, COMPLETE
Bilirubin, UA: NEGATIVE
Ketones, UA: NEGATIVE
Nitrite, UA: NEGATIVE
Protein,UA: NEGATIVE
Specific Gravity, UA: >1.03 — ABNORMAL HIGH (ref 1.005–1.030)
Urobilinogen, Ur: 0.2 mg/dL (ref 0.2–1.0)
pH, UA: 5.5 (ref 5.0–7.5)

## 2021-07-28 LAB — MICROSCOPIC EXAMINATION

## 2021-07-28 MED ORDER — MIRABEGRON ER 25 MG PO TB24: 25.0000 mg | ORAL_TABLET | Freq: Every day | ORAL | 11 refills | Status: DC

## 2021-07-28 MED ORDER — FLUCONAZOLE 100 MG PO TABS: 100.0000 mg | ORAL_TABLET | Freq: Every day | ORAL | 0 refills | Status: DC

## 2021-07-28 NOTE — Patient Instructions (Signed)
Cystoscopy Cystoscopy is a procedure that is used to help diagnose and sometimes treat conditions that affect the lower urinary tract. The lower urinary tract includes the bladder and the urethra. The urethra is the tube that drains urine from the bladder. Cystoscopy is done using a thin, tube-shaped instrument with a light and camera at the end (cystoscope). The cystoscope may be hard or flexible, depending on the goal of the procedure. The cystoscope is inserted through the urethra, into the bladder. Cystoscopy may be recommended if you have: Urinary tract infections that keep coming back. Blood in the urine (hematuria). An inability to control when you urinate (urinary incontinence) or an overactive bladder. Unusual cells found in a urine sample. A blockage in the urethra, such as a urinary stone. Painful urination. An abnormality in the bladder found during an intravenous pyelogram (IVP) or CT scan. Cystoscopy may also be done to remove a sample of tissue to be examined under a microscope (biopsy). What are the risks? Generally, this is a safe procedure. However, problems may occur, including: Infection. Bleeding.  What happens during the procedure?  You will be given one or more of the following: A medicine to numb the area (local anesthetic). The area around the opening of your urethra will be cleaned. The cystoscope will be passed through your urethra into your bladder. Germ-free (sterile) fluid will flow through the cystoscope to fill your bladder. The fluid will stretch your bladder so that your health care provider can clearly examine your bladder walls. Your doctor will look at the urethra and bladder. The cystoscope will be removed The procedure may vary among health care providers  What can I expect after the procedure? After the procedure, it is common to have: Some soreness or pain in your abdomen and urethra. Urinary symptoms. These include: Mild pain or burning when you  urinate. Pain should stop within a few minutes after you urinate. This may last for up to 1 week. A small amount of blood in your urine for several days. Feeling like you need to urinate but producing only a small amount of urine. Follow these instructions at home: General instructions Return to your normal activities as told by your health care provider.  Do not drive for 24 hours if you were given a sedative during your procedure. Watch for any blood in your urine. If the amount of blood in your urine increases, call your health care provider. If a tissue sample was removed for testing (biopsy) during your procedure, it is up to you to get your test results. Ask your health care provider, or the department that is doing the test, when your results will be ready. Drink enough fluid to keep your urine pale yellow. Keep all follow-up visits as told by your health care provider. This is important. Contact a health care provider if you: Have pain that gets worse or does not get better with medicine, especially pain when you urinate. Have trouble urinating. Have more blood in your urine. Get help right away if you: Have blood clots in your urine. Have abdominal pain. Have a fever or chills. Are unable to urinate. Summary Cystoscopy is a procedure that is used to help diagnose and sometimes treat conditions that affect the lower urinary tract. Cystoscopy is done using a thin, tube-shaped instrument with a light and camera at the end. After the procedure, it is common to have some soreness or pain in your abdomen and urethra. Watch for any blood in your urine.   If the amount of blood in your urine increases, call your health care provider. If you were prescribed an antibiotic medicine, take it as told by your health care provider. Do not stop taking the antibiotic even if you start to feel better. This information is not intended to replace advice given to you by your health care provider. Make  sure you discuss any questions you have with your health care provider. Document Revised: 10/11/2018 Document Reviewed: 10/11/2018 Elsevier Patient Education  2020 Elsevier Inc.  

## 2021-07-28 NOTE — Progress Notes (Signed)
07/28/2021 2:28 PM   Rachel Nielsen December 02, 1950 [id number]  Referring provider: Anabel Bene, MD [place] [facility] [place],  [place] [address]  Chief Complaint  Patient presents with   Urinary Incontinence    HPI: I was consulted to assess the patient for urinary incontinence.  She has urge incontinence especially when she goes from sitting to standing position.  She leaks with coughing sneezing and leaning forward but the urge component is more severe.  Sometimes she leaks without awareness.  She has high-volume bedwetting.  She wears 4 pads a day sometimes moderately wet sometimes soaks  She voids every 1-2 hours during the day and every 2 hours at night.  She can double void a modest amount a few times.  She had a bladder suspension years ago.  She is an insulin-dependent diabetic.  She has had a stroke.  She has had a hysterectomy.  She rarely was sneezing can lose bowel control but when she sits she has a bowel movement at times.  Uses a cane and walker for balance and for mild weakness on one side  No history of kidney stones or bladder infections.  Has not been treated   PMH: Past Medical History:  Diagnosis Date   Allergy    Anxiety    Asthma    Colon polyps    Constipation    COPD (chronic obstructive pulmonary disease) (HCC)    Depression    GERD (gastroesophageal reflux disease)    Glaucoma    Heart attack ([place])    Hypertension    Sleep apnea    Sleep apnea    Stroke [facility])    Urinary incontinence     Surgical History: Past Surgical History:  Procedure Laterality Date   APPENDECTOMY     CHOLECYSTECTOMY     COLONOSCOPY WITH PROPOFOL N/A 07/20/2018   Procedure: COLONOSCOPY WITH PROPOFOL;  Surgeon: Toledo, Benay Pike, MD;  Location: [facility] ENDOSCOPY;  Service: Gastroenterology;  Laterality: N/A;   ESOPHAGOGASTRODUODENOSCOPY N/A 07/20/2018   Procedure: ESOPHAGOGASTRODUODENOSCOPY (EGD);  Surgeon: Toledo, Benay Pike, MD;   Location: [facility] ENDOSCOPY;  Service: Gastroenterology;  Laterality: N/A;   GASTRECTOMY     TUBAL LIGATION     VAGINAL HYSTERECTOMY      Home Medications:  Allergies as of 07/28/2021       Reactions   Ace Inhibitors Cough        Medication List        Accurate as of July 28, 2021  2:28 PM. If you have any questions, ask your nurse or doctor.          Accu-Chek FastClix Lancets Misc Use to check blood sugar up to twice a day as directed   Accu-Chek Guide test strip Generic drug: glucose blood Use to check blood sugar up to twice a day as directed   albuterol 108 (90 Base) MCG/ACT inhaler Commonly known as: VENTOLIN HFA Inhale 2-4 puffs by mouth every 4 hours as needed for wheezing, cough, and/or shortness of breath   ALPRAZolam 0.5 MG tablet Commonly known as: XANAX Take 0.5-1 mg by mouth See admin instructions. Take 1 tablet (0.5mg ) by mouth twice daily and 2 tablets (1mg ) by mouth at bedtime   amLODipine 5 MG tablet Commonly known as: NORVASC Take 1 tablet (5 mg total) by mouth daily.   asenapine 5 MG Subl 24 hr tablet Commonly known as: SAPHRIS   bisoprolol 10 MG tablet Commonly known as: ZEBETA Take 1 tablet (  10 mg total) by mouth daily. OFFICE VISIT NEEDED FOR ADDITIONAL REFILLS   buPROPion 300 MG 24 hr tablet Commonly known as: WELLBUTRIN XL Take 1 tablet (300 mg total) by mouth daily.   busPIRone 15 MG tablet Commonly known as: BUSPAR Take 15 mg by mouth 2 (two) times daily.   dapagliflozin propanediol 5 MG Tabs tablet Commonly known as: Farxiga Take 1 tablet (5 mg total) by mouth daily.   DULoxetine 30 MG capsule Commonly known as: CYMBALTA Take 1 capsule by mouth every morning.   fluticasone 50 MCG/ACT nasal spray Commonly known as: FLONASE Place 1 spray into both nostrils daily.   gabapentin 600 MG tablet Commonly known as: NEURONTIN Take 300 mg in the morning and 600 mg at night   Global Ease Inject Pen Needles 32G X 4 MM  Misc Generic drug: Insulin Pen Needle Use as directed with insulin   BD Pen Needle Nano U/F 32G X 4 MM Misc Generic drug: Insulin Pen Needle Inject 1 Device into the skin daily.   insulin glargine 100 UNIT/ML injection Commonly known as: Lantus Inject 0.36 mLs (36 Units total) into the skin daily.   insulin lispro 100 UNIT/ML injection Commonly known as: HumaLOG Inject 0.12 mLs (12 Units total) into the skin 3 (three) times daily before meals.   INSULIN SYRINGE 1CC/31GX5/16" 31G X 5/16" 1 ML Misc 1 Syringe by Does not apply route in the morning, at noon, in the evening, and at bedtime.   Latuda 40 MG Tabs tablet Generic drug: lurasidone Take 40 mg by mouth at bedtime.   linaclotide 290 MCG Caps capsule Commonly known as: Linzess Take 1 capsule (290 mcg total) by mouth daily before breakfast.   meloxicam 15 MG tablet Commonly known as: MOBIC Take 1 tablet (15 mg total) by mouth at bedtime.   metFORMIN 500 MG tablet Commonly known as: GLUCOPHAGE 500 mg 2 (two) times daily with meals   montelukast 10 MG tablet Commonly known as: SINGULAIR Take 1 tablet (10 mg total) by mouth daily.   Olopatadine HCl 0.2 % Soln Place 1 drop into both eyes daily.   omeprazole 20 MG capsule Commonly known as: PRILOSEC Take 1 capsule (20 mg total) by mouth daily.   rosuvastatin 10 MG tablet Commonly known as: CRESTOR Take 1 tablet (10 mg total) by mouth daily.   traMADol 50 MG tablet Commonly known as: ULTRAM Take 50 mg twice a day   Travatan Z 0.004 % Soln ophthalmic solution Generic drug: Travoprost (BAK Free) Place 1 drop into both eyes at bedtime.   Trulicity 1.5 ST/4.1DQ Sopn Generic drug: Dulaglutide inject 1.5 mg once weekly for diabetes        Allergies:  Allergies  Allergen Reactions   Ace Inhibitors Cough    Family History: Family History  Problem Relation Age of Onset   Hypertension Mother    Atrial fibrillation Mother    Heart attack Mother     Hypertension Father    Stomach cancer Father    Diabetes Father    Liver cancer Father    Diabetes Sister    Hypertension Sister     Social History:  reports that she has quit smoking. She has never used smokeless tobacco. She reports that she does not currently use alcohol. She reports that she does not use drugs.  ROS:  Physical Exam: There were no vitals taken for this visit.  Constitutional:  Alert and oriented, No acute distress. HEENT: [place] AT, moist mucus membranes.  Trachea midline, no masses. Cardiovascular: No clubbing, cyanosis, or edema. Respiratory: Normal respiratory effort, no increased work of breathing. GI: Abdomen is soft, nontender, nondistended, no abdominal masses GU: No CVA tenderness.  In wheelchair Skin: No rashes, bruises or suspicious lesions. Lymph: No cervical or inguinal adenopathy. Neurologic: Grossly intact, no focal deficits, moving all 4 extremities. Psychiatric: Normal mood and affect.  Laboratory Data: Lab Results  Component Value Date   WBC 11.7 (H) 10/08/2020   HGB 12.5 10/08/2020   HCT 41.2 10/08/2020   MCV 84.3 10/08/2020   PLT 178 10/08/2020    Lab Results  Component Value Date   CREATININE 1.06 (H) 10/08/2020    No results found for: PSA  No results found for: TESTOSTERONE  Lab Results  Component Value Date   HGBA1C 11.7 05/14/2021    Urinalysis    Component Value Date/Time   COLORURINE YELLOW (A) 09/21/2020 1841   APPEARANCEUR CLEAR (A) 09/21/2020 1841   APPEARANCEUR Cloudy (A) 11/07/2019 0000   LABSPEC 1.015 09/21/2020 1841   PHURINE 5.0 09/21/2020 1841   GLUCOSEU >=500 (A) 09/21/2020 1841   HGBUR SMALL (A) 09/21/2020 1841   BILIRUBINUR NEGATIVE 09/21/2020 1841   BILIRUBINUR Negative 11/07/2019 0000   KETONESUR NEGATIVE 09/21/2020 1841   PROTEINUR NEGATIVE 09/21/2020 1841   NITRITE POSITIVE (A) 09/21/2020 1841   LEUKOCYTESUR TRACE (A) 09/21/2020 1841     Pertinent Imaging: Urine reviewed.  Yeast in urine.  Urine sent for culture.  Assessment & Plan: Patient has mixed incontinence and likely a primarily an overactive bladder with bedwetting as well.  She has frequency and nocturia.  Because of her medical issues and less mobility I did not order urodynamics but mentioned I may need to test in the future.  I gave her Myrbetriq 25 mg samples and prescription and come back for pelvic semination cystoscopy in 5 to 6 weeks.  Call in 3 days of Diflucan.  Call if urine culture is positive.  Patient was grateful not to travel to [facility] for urodynamics  1. Urinary incontinence, unspecified type  - Urinalysis, Complete - CULTURE, URINE COMPREHENSIVE   No follow-ups on file.  Reece Packer, MD  [facility] [address], [place], [place] [address] [phone]

## 2021-07-29 DIAGNOSIS — G8929 Other chronic pain: Secondary | ICD-10-CM | POA: Diagnosis not present

## 2021-07-29 DIAGNOSIS — F419 Anxiety disorder, unspecified: Secondary | ICD-10-CM | POA: Diagnosis not present

## 2021-07-29 DIAGNOSIS — M5441 Lumbago with sciatica, right side: Secondary | ICD-10-CM | POA: Diagnosis not present

## 2021-07-29 DIAGNOSIS — M25551 Pain in right hip: Secondary | ICD-10-CM | POA: Diagnosis not present

## 2021-07-29 DIAGNOSIS — M5442 Lumbago with sciatica, left side: Secondary | ICD-10-CM | POA: Diagnosis not present

## 2021-07-29 DIAGNOSIS — M5412 Radiculopathy, cervical region: Secondary | ICD-10-CM | POA: Diagnosis not present

## 2021-07-29 DIAGNOSIS — F321 Major depressive disorder, single episode, moderate: Secondary | ICD-10-CM | POA: Diagnosis not present

## 2021-07-29 DIAGNOSIS — M542 Cervicalgia: Secondary | ICD-10-CM | POA: Diagnosis not present

## 2021-07-29 DIAGNOSIS — M9931 Osseous stenosis of neural canal of cervical region: Secondary | ICD-10-CM | POA: Diagnosis not present

## 2021-07-30 DIAGNOSIS — I10 Essential (primary) hypertension: Secondary | ICD-10-CM | POA: Diagnosis not present

## 2021-07-30 DIAGNOSIS — M1990 Unspecified osteoarthritis, unspecified site: Secondary | ICD-10-CM | POA: Diagnosis not present

## 2021-07-30 DIAGNOSIS — G8929 Other chronic pain: Secondary | ICD-10-CM | POA: Diagnosis not present

## 2021-07-30 DIAGNOSIS — F419 Anxiety disorder, unspecified: Secondary | ICD-10-CM | POA: Diagnosis not present

## 2021-07-30 DIAGNOSIS — J449 Chronic obstructive pulmonary disease, unspecified: Secondary | ICD-10-CM | POA: Diagnosis not present

## 2021-07-30 DIAGNOSIS — F32A Depression, unspecified: Secondary | ICD-10-CM | POA: Diagnosis not present

## 2021-07-30 DIAGNOSIS — M5412 Radiculopathy, cervical region: Secondary | ICD-10-CM | POA: Diagnosis not present

## 2021-07-30 DIAGNOSIS — E119 Type 2 diabetes mellitus without complications: Secondary | ICD-10-CM | POA: Diagnosis not present

## 2021-07-30 DIAGNOSIS — I69354 Hemiplegia and hemiparesis following cerebral infarction affecting left non-dominant side: Secondary | ICD-10-CM | POA: Diagnosis not present

## 2021-07-31 DIAGNOSIS — F419 Anxiety disorder, unspecified: Secondary | ICD-10-CM | POA: Diagnosis not present

## 2021-07-31 DIAGNOSIS — G8929 Other chronic pain: Secondary | ICD-10-CM | POA: Diagnosis not present

## 2021-07-31 DIAGNOSIS — M5412 Radiculopathy, cervical region: Secondary | ICD-10-CM | POA: Diagnosis not present

## 2021-07-31 DIAGNOSIS — I10 Essential (primary) hypertension: Secondary | ICD-10-CM | POA: Diagnosis not present

## 2021-07-31 DIAGNOSIS — F32A Depression, unspecified: Secondary | ICD-10-CM | POA: Diagnosis not present

## 2021-07-31 DIAGNOSIS — M1990 Unspecified osteoarthritis, unspecified site: Secondary | ICD-10-CM | POA: Diagnosis not present

## 2021-07-31 DIAGNOSIS — J449 Chronic obstructive pulmonary disease, unspecified: Secondary | ICD-10-CM | POA: Diagnosis not present

## 2021-07-31 DIAGNOSIS — I69354 Hemiplegia and hemiparesis following cerebral infarction affecting left non-dominant side: Secondary | ICD-10-CM | POA: Diagnosis not present

## 2021-07-31 DIAGNOSIS — E119 Type 2 diabetes mellitus without complications: Secondary | ICD-10-CM | POA: Diagnosis not present

## 2021-08-01 LAB — CULTURE, URINE COMPREHENSIVE

## 2021-08-04 DIAGNOSIS — Z8673 Personal history of transient ischemic attack (TIA), and cerebral infarction without residual deficits: Secondary | ICD-10-CM | POA: Diagnosis not present

## 2021-08-04 DIAGNOSIS — G8929 Other chronic pain: Secondary | ICD-10-CM | POA: Diagnosis not present

## 2021-08-04 DIAGNOSIS — R531 Weakness: Secondary | ICD-10-CM | POA: Diagnosis not present

## 2021-08-04 DIAGNOSIS — M545 Low back pain, unspecified: Secondary | ICD-10-CM | POA: Diagnosis not present

## 2021-08-04 DIAGNOSIS — M542 Cervicalgia: Secondary | ICD-10-CM | POA: Diagnosis not present

## 2021-08-04 DIAGNOSIS — R252 Cramp and spasm: Secondary | ICD-10-CM | POA: Diagnosis not present

## 2021-08-04 DIAGNOSIS — R2689 Other abnormalities of gait and mobility: Secondary | ICD-10-CM | POA: Diagnosis not present

## 2021-08-05 DIAGNOSIS — I69354 Hemiplegia and hemiparesis following cerebral infarction affecting left non-dominant side: Secondary | ICD-10-CM | POA: Diagnosis not present

## 2021-08-05 DIAGNOSIS — G8929 Other chronic pain: Secondary | ICD-10-CM | POA: Diagnosis not present

## 2021-08-05 DIAGNOSIS — E119 Type 2 diabetes mellitus without complications: Secondary | ICD-10-CM | POA: Diagnosis not present

## 2021-08-05 DIAGNOSIS — F32A Depression, unspecified: Secondary | ICD-10-CM | POA: Diagnosis not present

## 2021-08-05 DIAGNOSIS — M1990 Unspecified osteoarthritis, unspecified site: Secondary | ICD-10-CM | POA: Diagnosis not present

## 2021-08-05 DIAGNOSIS — J449 Chronic obstructive pulmonary disease, unspecified: Secondary | ICD-10-CM | POA: Diagnosis not present

## 2021-08-05 DIAGNOSIS — I10 Essential (primary) hypertension: Secondary | ICD-10-CM | POA: Diagnosis not present

## 2021-08-05 DIAGNOSIS — M5412 Radiculopathy, cervical region: Secondary | ICD-10-CM | POA: Diagnosis not present

## 2021-08-05 DIAGNOSIS — F419 Anxiety disorder, unspecified: Secondary | ICD-10-CM | POA: Diagnosis not present

## 2021-08-07 DIAGNOSIS — G8929 Other chronic pain: Secondary | ICD-10-CM | POA: Diagnosis not present

## 2021-08-07 DIAGNOSIS — J449 Chronic obstructive pulmonary disease, unspecified: Secondary | ICD-10-CM | POA: Diagnosis not present

## 2021-08-07 DIAGNOSIS — I10 Essential (primary) hypertension: Secondary | ICD-10-CM | POA: Diagnosis not present

## 2021-08-07 DIAGNOSIS — E119 Type 2 diabetes mellitus without complications: Secondary | ICD-10-CM | POA: Diagnosis not present

## 2021-08-07 DIAGNOSIS — F419 Anxiety disorder, unspecified: Secondary | ICD-10-CM | POA: Diagnosis not present

## 2021-08-07 DIAGNOSIS — M5412 Radiculopathy, cervical region: Secondary | ICD-10-CM | POA: Diagnosis not present

## 2021-08-07 DIAGNOSIS — M1990 Unspecified osteoarthritis, unspecified site: Secondary | ICD-10-CM | POA: Diagnosis not present

## 2021-08-07 DIAGNOSIS — I69354 Hemiplegia and hemiparesis following cerebral infarction affecting left non-dominant side: Secondary | ICD-10-CM | POA: Diagnosis not present

## 2021-08-07 DIAGNOSIS — F32A Depression, unspecified: Secondary | ICD-10-CM | POA: Diagnosis not present

## 2021-08-13 DIAGNOSIS — F3189 Other bipolar disorder: Secondary | ICD-10-CM | POA: Diagnosis not present

## 2021-08-13 DIAGNOSIS — F419 Anxiety disorder, unspecified: Secondary | ICD-10-CM | POA: Diagnosis not present

## 2021-08-13 DIAGNOSIS — F321 Major depressive disorder, single episode, moderate: Secondary | ICD-10-CM | POA: Diagnosis not present

## 2021-08-13 DIAGNOSIS — F4310 Post-traumatic stress disorder, unspecified: Secondary | ICD-10-CM | POA: Diagnosis not present

## 2021-08-14 DIAGNOSIS — I69354 Hemiplegia and hemiparesis following cerebral infarction affecting left non-dominant side: Secondary | ICD-10-CM | POA: Diagnosis not present

## 2021-08-14 DIAGNOSIS — F32A Depression, unspecified: Secondary | ICD-10-CM | POA: Diagnosis not present

## 2021-08-14 DIAGNOSIS — E119 Type 2 diabetes mellitus without complications: Secondary | ICD-10-CM | POA: Diagnosis not present

## 2021-08-14 DIAGNOSIS — F419 Anxiety disorder, unspecified: Secondary | ICD-10-CM | POA: Diagnosis not present

## 2021-08-14 DIAGNOSIS — M5412 Radiculopathy, cervical region: Secondary | ICD-10-CM | POA: Diagnosis not present

## 2021-08-14 DIAGNOSIS — I10 Essential (primary) hypertension: Secondary | ICD-10-CM | POA: Diagnosis not present

## 2021-08-14 DIAGNOSIS — M1990 Unspecified osteoarthritis, unspecified site: Secondary | ICD-10-CM | POA: Diagnosis not present

## 2021-08-14 DIAGNOSIS — J449 Chronic obstructive pulmonary disease, unspecified: Secondary | ICD-10-CM | POA: Diagnosis not present

## 2021-08-14 DIAGNOSIS — G8929 Other chronic pain: Secondary | ICD-10-CM | POA: Diagnosis not present

## 2021-08-15 DIAGNOSIS — E1165 Type 2 diabetes mellitus with hyperglycemia: Secondary | ICD-10-CM | POA: Diagnosis not present

## 2021-08-19 DIAGNOSIS — M5412 Radiculopathy, cervical region: Secondary | ICD-10-CM | POA: Diagnosis not present

## 2021-08-19 DIAGNOSIS — F32A Depression, unspecified: Secondary | ICD-10-CM | POA: Diagnosis not present

## 2021-08-19 DIAGNOSIS — I69354 Hemiplegia and hemiparesis following cerebral infarction affecting left non-dominant side: Secondary | ICD-10-CM | POA: Diagnosis not present

## 2021-08-19 DIAGNOSIS — G8929 Other chronic pain: Secondary | ICD-10-CM | POA: Diagnosis not present

## 2021-08-19 DIAGNOSIS — F419 Anxiety disorder, unspecified: Secondary | ICD-10-CM | POA: Diagnosis not present

## 2021-08-19 DIAGNOSIS — E119 Type 2 diabetes mellitus without complications: Secondary | ICD-10-CM | POA: Diagnosis not present

## 2021-08-19 DIAGNOSIS — J449 Chronic obstructive pulmonary disease, unspecified: Secondary | ICD-10-CM | POA: Diagnosis not present

## 2021-08-19 DIAGNOSIS — I10 Essential (primary) hypertension: Secondary | ICD-10-CM | POA: Diagnosis not present

## 2021-08-19 DIAGNOSIS — M1990 Unspecified osteoarthritis, unspecified site: Secondary | ICD-10-CM | POA: Diagnosis not present

## 2021-08-25 DIAGNOSIS — M1990 Unspecified osteoarthritis, unspecified site: Secondary | ICD-10-CM | POA: Diagnosis not present

## 2021-08-25 DIAGNOSIS — J449 Chronic obstructive pulmonary disease, unspecified: Secondary | ICD-10-CM | POA: Diagnosis not present

## 2021-08-25 DIAGNOSIS — I69354 Hemiplegia and hemiparesis following cerebral infarction affecting left non-dominant side: Secondary | ICD-10-CM | POA: Diagnosis not present

## 2021-08-25 DIAGNOSIS — I10 Essential (primary) hypertension: Secondary | ICD-10-CM | POA: Diagnosis not present

## 2021-08-25 DIAGNOSIS — M5412 Radiculopathy, cervical region: Secondary | ICD-10-CM | POA: Diagnosis not present

## 2021-08-25 DIAGNOSIS — E119 Type 2 diabetes mellitus without complications: Secondary | ICD-10-CM | POA: Diagnosis not present

## 2021-08-25 DIAGNOSIS — F32A Depression, unspecified: Secondary | ICD-10-CM | POA: Diagnosis not present

## 2021-08-25 DIAGNOSIS — F419 Anxiety disorder, unspecified: Secondary | ICD-10-CM | POA: Diagnosis not present

## 2021-08-25 DIAGNOSIS — Z8673 Personal history of transient ischemic attack (TIA), and cerebral infarction without residual deficits: Secondary | ICD-10-CM | POA: Diagnosis not present

## 2021-08-25 DIAGNOSIS — G8929 Other chronic pain: Secondary | ICD-10-CM | POA: Diagnosis not present

## 2021-08-26 DIAGNOSIS — E1165 Type 2 diabetes mellitus with hyperglycemia: Secondary | ICD-10-CM | POA: Diagnosis not present

## 2021-08-26 DIAGNOSIS — G459 Transient cerebral ischemic attack, unspecified: Secondary | ICD-10-CM | POA: Diagnosis not present

## 2021-08-26 DIAGNOSIS — J449 Chronic obstructive pulmonary disease, unspecified: Secondary | ICD-10-CM | POA: Diagnosis not present

## 2021-08-26 DIAGNOSIS — R0600 Dyspnea, unspecified: Secondary | ICD-10-CM | POA: Diagnosis not present

## 2021-08-26 DIAGNOSIS — I10 Essential (primary) hypertension: Secondary | ICD-10-CM | POA: Diagnosis not present

## 2021-08-26 DIAGNOSIS — Z23 Encounter for immunization: Secondary | ICD-10-CM | POA: Diagnosis not present

## 2021-08-26 DIAGNOSIS — Z8673 Personal history of transient ischemic attack (TIA), and cerebral infarction without residual deficits: Secondary | ICD-10-CM | POA: Diagnosis not present

## 2021-08-28 DIAGNOSIS — F419 Anxiety disorder, unspecified: Secondary | ICD-10-CM | POA: Diagnosis not present

## 2021-08-28 DIAGNOSIS — F321 Major depressive disorder, single episode, moderate: Secondary | ICD-10-CM | POA: Diagnosis not present

## 2021-09-02 ENCOUNTER — Other Ambulatory Visit: Payer: Self-pay | Admitting: Internal Medicine

## 2021-09-02 NOTE — Telephone Encounter (Signed)
Requested Prescriptions  Pending Prescriptions Disp Refills  . fluticasone-salmeterol (ADVAIR) 250-50 MCG/ACT AEPB [Pharmacy Med Name: FLUTICASONE-SALMETEROL 250-50] 60 each 0    Sig: Inhale 1 puff into the lungs in the morning and at bedtime.     Pulmonology:  Combination Products Passed - 09/02/2021 12:18 PM      Passed - Valid encounter within last 12 months    Recent Outpatient Visits          1 month ago Mixed diabetic hyperlipidemia associated with type 2 diabetes mellitus [facility])   [facility] [place], Coralie Keens, NP   8 months ago Uncontrolled type 2 diabetes mellitus with hyperglycemia [facility])   [place], DO   10 months ago Encounter to establish care with new doctor   [place], FNP      Future Appointments            In 4 months  [facility], [place]   In 4 months [place], Coralie Keens, NP [facility], [facility]

## 2021-09-02 NOTE — Telephone Encounter (Signed)
This Advair was refused because it was discontinued 07/09/2021.

## 2021-09-08 ENCOUNTER — Encounter: Payer: Self-pay | Admitting: Urology

## 2021-09-08 ENCOUNTER — Other Ambulatory Visit: Payer: Medicare HMO | Admitting: Urology

## 2021-09-08 DIAGNOSIS — Z794 Long term (current) use of insulin: Secondary | ICD-10-CM | POA: Diagnosis not present

## 2021-09-08 DIAGNOSIS — E1165 Type 2 diabetes mellitus with hyperglycemia: Secondary | ICD-10-CM | POA: Diagnosis not present

## 2021-09-08 DIAGNOSIS — E1169 Type 2 diabetes mellitus with other specified complication: Secondary | ICD-10-CM | POA: Diagnosis not present

## 2021-09-08 DIAGNOSIS — E669 Obesity, unspecified: Secondary | ICD-10-CM | POA: Diagnosis not present

## 2021-09-09 DIAGNOSIS — M5412 Radiculopathy, cervical region: Secondary | ICD-10-CM | POA: Diagnosis not present

## 2021-09-09 DIAGNOSIS — J9801 Acute bronchospasm: Secondary | ICD-10-CM | POA: Diagnosis not present

## 2021-09-09 DIAGNOSIS — G4733 Obstructive sleep apnea (adult) (pediatric): Secondary | ICD-10-CM | POA: Diagnosis not present

## 2021-09-09 DIAGNOSIS — R0609 Other forms of dyspnea: Secondary | ICD-10-CM | POA: Diagnosis not present

## 2021-09-09 DIAGNOSIS — E1165 Type 2 diabetes mellitus with hyperglycemia: Secondary | ICD-10-CM | POA: Diagnosis not present

## 2021-09-15 ENCOUNTER — Observation Stay
Admission: EM | Admit: 2021-09-15 | Discharge: 2021-09-15 | Disposition: A | Discharge status: Home / Self Care | Payer: Medicare HMO | Attending: Emergency Medicine | Admitting: Emergency Medicine

## 2021-09-15 ENCOUNTER — Emergency Department: Payer: Medicare HMO

## 2021-09-15 ENCOUNTER — Other Ambulatory Visit: Payer: Self-pay

## 2021-09-15 DIAGNOSIS — F411 Generalized anxiety disorder: Secondary | ICD-10-CM | POA: Diagnosis present

## 2021-09-15 DIAGNOSIS — R0600 Dyspnea, unspecified: Secondary | ICD-10-CM | POA: Diagnosis not present

## 2021-09-15 DIAGNOSIS — I10 Essential (primary) hypertension: Secondary | ICD-10-CM | POA: Diagnosis not present

## 2021-09-15 DIAGNOSIS — E1165 Type 2 diabetes mellitus with hyperglycemia: Secondary | ICD-10-CM | POA: Diagnosis present

## 2021-09-15 DIAGNOSIS — J45909 Unspecified asthma, uncomplicated: Secondary | ICD-10-CM | POA: Insufficient documentation

## 2021-09-15 DIAGNOSIS — E119 Type 2 diabetes mellitus without complications: Secondary | ICD-10-CM | POA: Diagnosis present

## 2021-09-15 DIAGNOSIS — Z7952 Long term (current) use of systemic steroids: Secondary | ICD-10-CM | POA: Insufficient documentation

## 2021-09-15 DIAGNOSIS — F419 Anxiety disorder, unspecified: Secondary | ICD-10-CM | POA: Diagnosis present

## 2021-09-15 DIAGNOSIS — R4702 Dysphasia: Secondary | ICD-10-CM | POA: Diagnosis not present

## 2021-09-15 DIAGNOSIS — F32A Depression, unspecified: Secondary | ICD-10-CM | POA: Diagnosis present

## 2021-09-15 DIAGNOSIS — J9601 Acute respiratory failure with hypoxia: Secondary | ICD-10-CM | POA: Diagnosis not present

## 2021-09-15 DIAGNOSIS — J449 Chronic obstructive pulmonary disease, unspecified: Secondary | ICD-10-CM

## 2021-09-15 DIAGNOSIS — Z20822 Contact with and (suspected) exposure to covid-19: Secondary | ICD-10-CM | POA: Insufficient documentation

## 2021-09-15 DIAGNOSIS — Z87891 Personal history of nicotine dependence: Secondary | ICD-10-CM | POA: Diagnosis not present

## 2021-09-15 DIAGNOSIS — J441 Chronic obstructive pulmonary disease with (acute) exacerbation: Secondary | ICD-10-CM | POA: Diagnosis not present

## 2021-09-15 DIAGNOSIS — R000 Tachycardia, unspecified: Secondary | ICD-10-CM | POA: Diagnosis not present

## 2021-09-15 DIAGNOSIS — R062 Wheezing: Secondary | ICD-10-CM | POA: Diagnosis not present

## 2021-09-15 DIAGNOSIS — Z79899 Other long term (current) drug therapy: Secondary | ICD-10-CM | POA: Insufficient documentation

## 2021-09-15 DIAGNOSIS — R0602 Shortness of breath: Secondary | ICD-10-CM | POA: Diagnosis present

## 2021-09-15 LAB — COMPREHENSIVE METABOLIC PANEL
ALT: 22 U/L (ref 0–44)
AST: 19 U/L (ref 15–41)
Albumin: 3.8 g/dL (ref 3.5–5.0)
Alkaline Phosphatase: 86 U/L (ref 38–126)
Anion gap: 6 (ref 5–15)
BUN: 22 mg/dL (ref 8–23)
CO2: 28 mmol/L (ref 22–32)
Calcium: 9.4 mg/dL (ref 8.9–10.3)
Chloride: 104 mmol/L (ref 98–111)
Creatinine, Ser: 0.97 mg/dL (ref 0.44–1.00)
GFR, Estimated: >60 mL/min (ref >60)
Glucose, Bld: 225 mg/dL — ABNORMAL HIGH (ref 70–99)
Potassium: 4.4 mmol/L (ref 3.5–5.1)
Sodium: 138 mmol/L (ref 135–145)
Total Bilirubin: 0.7 mg/dL (ref 0.3–1.2)
Total Protein: 7.6 g/dL (ref 6.5–8.1)

## 2021-09-15 LAB — HEMOGLOBIN A1C
Hgb A1c MFr Bld: 9.2 % — ABNORMAL HIGH (ref 4.8–5.6)
Mean Plasma Glucose: 217.34 mg/dL

## 2021-09-15 LAB — CBC WITH DIFFERENTIAL/PLATELET
Abs Immature Granulocytes: 0.07 10*3/uL (ref 0.00–0.07)
Basophils Absolute: 0.1 10*3/uL (ref 0.0–0.1)
Basophils Relative: 1 %
Eosinophils Absolute: 1.3 10*3/uL — ABNORMAL HIGH (ref 0.0–0.5)
Eosinophils Relative: 9 %
HCT: 47 % — ABNORMAL HIGH (ref 36.0–46.0)
Hemoglobin: 14.2 g/dL (ref 12.0–15.0)
Immature Granulocytes: 1 %
Lymphocytes Relative: 23 %
Lymphs Abs: 3.5 10*3/uL (ref 0.7–4.0)
MCH: 25.6 pg — ABNORMAL LOW (ref 26.0–34.0)
MCHC: 30.2 g/dL (ref 30.0–36.0)
MCV: 84.8 fL (ref 80.0–100.0)
Monocytes Absolute: 0.8 10*3/uL (ref 0.1–1.0)
Monocytes Relative: 6 %
Neutro Abs: 9.2 10*3/uL — ABNORMAL HIGH (ref 1.7–7.7)
Neutrophils Relative %: 60 %
Platelets: 209 10*3/uL (ref 150–400)
RBC: 5.54 MIL/uL — ABNORMAL HIGH (ref 3.87–5.11)
RDW: 15.2 % (ref 11.5–15.5)
WBC: 14.9 10*3/uL — ABNORMAL HIGH (ref 4.0–10.5)
nRBC: 0 % (ref 0.0–0.2)

## 2021-09-15 LAB — RESP PANEL BY RT-PCR (FLU A&B, COVID) ARPGX2
Influenza A by PCR: NEGATIVE
Influenza B by PCR: NEGATIVE
SARS Coronavirus 2 by RT PCR: NEGATIVE

## 2021-09-15 LAB — CBG MONITORING, ED
Glucose-Capillary: 275 mg/dL — ABNORMAL HIGH (ref 70–99)
Glucose-Capillary: 321 mg/dL — ABNORMAL HIGH (ref 70–99)

## 2021-09-15 LAB — TROPONIN I (HIGH SENSITIVITY)
Troponin I (High Sensitivity): 6 ng/L (ref <18)
Troponin I (High Sensitivity): 8 ng/L (ref <18)

## 2021-09-15 LAB — BRAIN NATRIURETIC PEPTIDE: B Natriuretic Peptide: 19.3 pg/mL (ref 0.0–100.0)

## 2021-09-15 LAB — HIV ANTIBODY (ROUTINE TESTING W REFLEX): HIV Screen 4th Generation wRfx: NONREACTIVE

## 2021-09-15 MED ORDER — METHYLPREDNISOLONE SODIUM SUCC 125 MG IJ SOLR
125.0000 mg | Freq: Once | INTRAMUSCULAR | Status: AC
  Administered 2021-09-15: 125 mg via INTRAVENOUS
  Filled 2021-09-15: qty 2

## 2021-09-15 MED ORDER — AMLODIPINE BESYLATE 5 MG PO TABS
5.0000 mg | ORAL_TABLET | Freq: Every day | ORAL | Status: DC
  Administered 2021-09-15: 5 mg via ORAL
  Filled 2021-09-15: qty 1

## 2021-09-15 MED ORDER — METHYLPREDNISOLONE SODIUM SUCC 40 MG IJ SOLR: 40.0000 mg | Freq: Two times a day (BID) | INTRAMUSCULAR | Status: DC

## 2021-09-15 MED ORDER — ENOXAPARIN SODIUM 60 MG/0.6ML IJ SOSY
0.5000 mg/kg | PREFILLED_SYRINGE | INTRAMUSCULAR | Status: DC
  Administered 2021-09-15: 50 mg via SUBCUTANEOUS
  Filled 2021-09-15: qty 0.6

## 2021-09-15 MED ORDER — IPRATROPIUM-ALBUTEROL 0.5-2.5 (3) MG/3ML IN SOLN
3.0000 mL | Freq: Once | RESPIRATORY_TRACT | Status: AC
  Administered 2021-09-15: 3 mL via RESPIRATORY_TRACT
  Filled 2021-09-15: qty 3

## 2021-09-15 MED ORDER — INSULIN ASPART 100 UNIT/ML IJ SOLN
0.0000 [IU] | Freq: Three times a day (TID) | INTRAMUSCULAR | Status: DC
  Administered 2021-09-15: 15 [IU] via SUBCUTANEOUS
  Administered 2021-09-15: 11 [IU] via SUBCUTANEOUS
  Filled 2021-09-15 (×2): qty 1

## 2021-09-15 MED ORDER — INSULIN ASPART 100 UNIT/ML IJ SOLN: 0.0000 [IU] | Freq: Every day | INTRAMUSCULAR | Status: DC

## 2021-09-15 MED ORDER — ALBUTEROL SULFATE (2.5 MG/3ML) 0.083% IN NEBU: 2.5000 mg | INHALATION_SOLUTION | RESPIRATORY_TRACT | Status: DC | PRN

## 2021-09-15 MED ORDER — PREDNISONE 20 MG PO TABS: 40.0000 mg | ORAL_TABLET | Freq: Every day | ORAL | Status: DC

## 2021-09-15 MED ORDER — IPRATROPIUM-ALBUTEROL 0.5-2.5 (3) MG/3ML IN SOLN
3.0000 mL | Freq: Four times a day (QID) | RESPIRATORY_TRACT | Status: DC
  Administered 2021-09-15: 3 mL via RESPIRATORY_TRACT
  Filled 2021-09-15: qty 3

## 2021-09-15 MED ORDER — IPRATROPIUM-ALBUTEROL 0.5-2.5 (3) MG/3ML IN SOLN: 3.0000 mL | RESPIRATORY_TRACT | 0 refills | Status: DC | PRN

## 2021-09-15 MED ORDER — PREDNISONE 10 MG PO TABS: ORAL_TABLET | ORAL | 0 refills | Status: AC

## 2021-09-15 NOTE — ED Notes (Signed)
Patient verbalizes understanding of discharge instructions. Opportunity for questioning and answers were provided. Armband removed by staff, pt discharged from ED. Wheeled out to lobby  

## 2021-09-15 NOTE — ED Notes (Signed)
Report received from April RN. Patient care assumed. Patient/RN introduction complete. Pt awaiting hospital room availability. No co pain or discomfort.

## 2021-09-15 NOTE — Evaluation (Signed)
Physical Therapy Evaluation Patient Details Name: Rachel Nielsen MRN: [id number] DOB: October 26, 1951 Today's Date: 09/15/2021  History of Present Illness  Pt is a 70 y.o. female with medical history significant for DM, HTN, depression, COPD not on oxygen who presents to the ED with 3-day history of shortness of breath and wheezing typical for her COPD exacerbations but not responding to home inhalers. MD assessment includes: COPD with acute exacerbation and acute respiratory failure with hypoxia.   Clinical Impression  Pt was pleasant and motivated to participate during the session and put forth good effort throughout. During subjective, pt reported experiencing bouts of incontinence and typically wore pull ups at home, as well as having experienced a stroke a few years ago that affected the left side of her body. Pt's SpO2 was 94% and HR was 103 in supine. Pt was able to perform bed mobility without assistance to sit EOB. Once sitting EOB, SpO2 was 90% and HR was 118. Pt required min guard when performing STS from lowest bed setting and was not symptomatic. In standing, pt's SpO2 was 95% and HR was 117. Pt was able to ambulate 20ft with min guard for safety and SpO2 remaining at 94% and HR at 117 the entire walk. SpO2 and HR remained the same once seated EOB. Pt will benefit from HHPT upon discharge to safely address deficits listed in patient problem list for decreased caregiver assistance and eventual return to PLOF.   Recommendations for follow up therapy are one component of a multi-disciplinary discharge planning process, led by the attending physician.  Recommendations may be updated based on patient status, additional functional criteria and insurance authorization.  Follow Up Recommendations Home health PT    Assistance Recommended at Discharge PRN  Functional Status Assessment Patient has had a recent decline in their functional status and demonstrates the ability to make significant  improvements in function in a reasonable and predictable amount of time.  Equipment Recommendations  None recommended by PT    Recommendations for Other Services       Precautions / Restrictions Precautions Precautions: Fall Restrictions Weight Bearing Restrictions: No      Mobility  Bed Mobility Overal bed mobility: Modified Independent             General bed mobility comments: Increased time and effort    Transfers Overall transfer level: Needs assistance Equipment used: Rolling walker (2 wheels) Transfers: Sit to/from Stand Sit to Stand: Min guard           General transfer comment: Increased time and effort, min guard for safety    Ambulation/Gait Ambulation/Gait assistance: Min guard Gait Distance (Feet): 50 Feet Assistive device: Rolling walker (2 wheels) Gait Pattern/deviations: Step-through pattern;Decreased step length - right;Decreased step length - left;Decreased stride length Gait velocity: decreased     General Gait Details: Pt required a RW and min guard for safety, SpO2 and HR remained WNL  Stairs            Wheelchair Mobility    Modified Rankin (Stroke Patients Only)       Balance Overall balance assessment: Needs assistance Sitting-balance support: Feet supported;No upper extremity supported Sitting balance-Leahy Scale: Good     Standing balance support: Bilateral upper extremity supported;During functional activity Standing balance-Leahy Scale: Good Standing balance comment: Pt has history of falls but did well ambulating with RW  Pertinent Vitals/Pain Pain Assessment: No/denies pain    Home Living Family/patient expects to be discharged to:: Private residence Living Arrangements: Children Available Help at Discharge: Family;Available 24 hours/day (Son)   Home Access: Stairs to enter Entrance Stairs-Rails: None Entrance Stairs-Number of Steps: 2   Home Layout: One  level Home Equipment: Conservation officer, nature (2 wheels);Cane - single point;Tub bench      Prior Function Prior Level of Function : History of Falls (last six months);Needs assist;Independent/Modified Independent             Mobility Comments: Pt is a limited community ambulator and has her son do the grocery shopping, uses SPC to get dow nthe stairs, uses RW in the house, pt reports having had 8 falls in the past 6 months due to balance issues ADLs Comments: Pt reported being independent with ADLs     Hand Dominance        Extremity/Trunk Assessment   Upper Extremity Assessment Upper Extremity Assessment: Overall WFL for tasks assessed    Lower Extremity Assessment Lower Extremity Assessment: Generalized weakness       Communication   Communication: No difficulties  Cognition Arousal/Alertness: Awake/alert Behavior During Therapy: WFL for tasks assessed/performed Overall Cognitive Status: Within Functional Limits for tasks assessed                                          General Comments      Exercises     Assessment/Plan    PT Assessment Patient needs continued PT services  PT Problem List Decreased strength;Decreased activity tolerance;Decreased balance;Decreased mobility;Decreased knowledge of use of DME       PT Treatment Interventions DME instruction;Balance training;Gait training;Stair training;Functional mobility training;Therapeutic activities;Therapeutic exercise    PT Goals (Current goals can be found in the Care Plan section)  Acute Rehab PT Goals Patient Stated Goal: Be able to ride stationary bike PT Goal Formulation: With patient Time For Goal Achievement: 09/28/21 Potential to Achieve Goals: Good    Frequency Min 2X/week   Barriers to discharge        Co-evaluation               AM-PAC PT "6 Clicks" Mobility  Outcome Measure Help needed turning from your back to your side while in a flat bed without using bedrails?:  A Little Help needed moving from lying on your back to sitting on the side of a flat bed without using bedrails?: A Little Help needed moving to and from a bed to a chair (including a wheelchair)?: A Little Help needed standing up from a chair using your arms (e.g., wheelchair or bedside chair)?: A Little Help needed to walk in hospital room?: A Little Help needed climbing 3-5 steps with a railing? : A Little 6 Click Score: 18    End of Session Equipment Utilized During Treatment: Gait belt Activity Tolerance: Patient tolerated treatment well Patient left: in bed;with call bell/phone within reach Nurse Communication: Mobility status PT Visit Diagnosis: Unsteadiness on feet (R26.81);Muscle weakness (generalized) (M62.81);History of falling (Z91.81);Difficulty in walking, not elsewhere classified (R26.2)    Time: 2409-7353 PT Time Calculation (min) (ACUTE ONLY): 32 min   Charges:              Sheldon Silvan SPT 09/15/21, 1:41 PM

## 2021-09-15 NOTE — Discharge Summary (Signed)
Physician Discharge Summary  Rachel Nielsen TXM:[id number] DOB: August 15, 1951 DOA: 09/15/2021  PCP: Jearld Fenton, NP  Admit date: 09/15/2021 Discharge date: 09/15/2021  Admitted From: Home Disposition:   Home  Recommendations for Outpatient Follow-up:  Follow up with PCP in 1-2 weeks Please obtain BMP/CBC in one week  Home Health: PT OT Equipment/Devices: None  Discharge Condition: Stable CODE STATUS: Full Diet recommendation: Low-salt low-fat low-carb diet  Brief/Interim Summary: Rachel Nielsen is a 70 y.o. female with medical history significant for DM, HTN, depression, COPD not on oxygen who presents to the ED with 3-day history of shortness of breath and wheezing typical for her COPD exacerbations but not responding to home inhalers.  She went to see her pulmonologist and received a nebulizer treatment there but symptoms continued to get worse - ultimately presenting here for acute hypoxia with sats in the 80s requiring supplemental oxygen to maintain O2 >90%.   Assessment & Plan:   Acute respiratory failure with hypoxia, resolved Concurrent COPD with acute exacerbation ([place]) -Respiratory status improved drastically with IV steroids, supportive care and nebs. -Appears patient has been attempting to get nebulizer at home but has had difficulty obtaining device, will set up with nebulizer at discharge through case management, given rapid improvement in symptoms and hypoxia today with nebs steroids and supportive care she is otherwise stable and agreeable for discharge home     Hypertension - Continue home bisoprolol   Profoundly uncontrolled type 2 diabetes mellitus with hyperglycemia ([place]) -Lengthy discussion about diabetic diet, A1c 11.7 over the summer, close follow-up with PCP for repeat evaluation   GAD (generalized anxiety disorder) - Continue home meds, no changes during hospital stay, close follow-up with PCP to ensure accurate and appropriate medication list given  numerous CNS anxiolytic medications at her age increased risk of polypharmacy   Discharge Instructions   Allergies as of 09/15/2021       Reactions   Ace Inhibitors Cough        Medication List     STOP taking these medications    fluconazole 100 MG tablet Commonly known as: DIFLUCAN   insulin lispro 100 UNIT/ML injection Commonly known as: HumaLOG   meloxicam 15 MG tablet Commonly known as: MOBIC   nortriptyline 10 MG capsule Commonly known as: PAMELOR   pregabalin 25 MG capsule Commonly known as: LYRICA   traMADol 50 MG tablet Commonly known as: ULTRAM   Trulicity 1.5 QM/2.5OI Sopn Generic drug: Dulaglutide   Trulicity 3 BB/0.4UG Sopn Generic drug: Dulaglutide       TAKE these medications    Accu-Chek FastClix Lancets Misc Use to check blood sugar up to twice a day as directed   Accu-Chek Guide test strip Generic drug: glucose blood Use to check blood sugar up to twice a day as directed   albuterol 108 (90 Base) MCG/ACT inhaler Commonly known as: VENTOLIN HFA Inhale 2-4 puffs by mouth every 4 hours as needed for wheezing, cough, and/or shortness of breath   ALPRAZolam 1 MG tablet Commonly known as: XANAX Take 1 mg by mouth 3 (three) times daily as needed.   amLODipine 5 MG tablet Commonly known as: NORVASC Take 1 tablet (5 mg total) by mouth daily.   asenapine 5 MG Subl 24 hr tablet Commonly known as: SAPHRIS Place 5 mg under the tongue every evening.   bisoprolol 10 MG tablet Commonly known as: ZEBETA Take 1 tablet (10 mg total) by mouth daily. OFFICE VISIT NEEDED FOR ADDITIONAL REFILLS  buPROPion 300 MG 24 hr tablet Commonly known as: WELLBUTRIN XL Take 1 tablet (300 mg total) by mouth daily.   busPIRone 15 MG tablet Commonly known as: BUSPAR Take 15 mg by mouth 2 (two) times daily.   DULoxetine 60 MG capsule Commonly known as: CYMBALTA Take 1 capsule by mouth 2 (two) times daily. What changed: Another medication with the same  name was removed. Continue taking this medication, and follow the directions you see here.   Farxiga 10 MG Tabs tablet Generic drug: dapagliflozin propanediol Take 10 mg by mouth daily. What changed: Another medication with the same name was removed. Continue taking this medication, and follow the directions you see here.   fluticasone 50 MCG/ACT nasal spray Commonly known as: FLONASE Place 1 spray into both nostrils daily.   fluticasone-salmeterol 250-50 MCG/ACT Aepb Commonly known as: ADVAIR Inhale 1 puff into the lungs 2 (two) times daily.   gabapentin 600 MG tablet Commonly known as: NEURONTIN Take 600-900 mg by mouth 3 (three) times daily. 600 mg AM, 600 mg pm and 900 mg at bedtime   Global Ease Inject Pen Needles 32G X 4 MM Misc Generic drug: Insulin Pen Needle Use as directed with insulin   BD Pen Needle Nano U/F 32G X 4 MM Misc Generic drug: Insulin Pen Needle Inject 1 Device into the skin daily.   insulin glargine 100 UNIT/ML injection Commonly known as: Lantus Inject 0.36 mLs (36 Units total) into the skin daily. What changed:  how much to take when to take this   INSULIN SYRINGE 1CC/31GX5/16" 31G X 5/16" 1 ML Misc 1 Syringe by Does not apply route in the morning, at noon, in the evening, and at bedtime.   Latuda 40 MG Tabs tablet Generic drug: lurasidone Take 40 mg by mouth at bedtime.   linaclotide 290 MCG Caps capsule Commonly known as: Linzess Take 1 capsule (290 mcg total) by mouth daily before breakfast.   metFORMIN 500 MG tablet Commonly known as: GLUCOPHAGE Take 1 tablet by mouth 2 (two) times daily.   mirabegron ER 25 MG Tb24 tablet Commonly known as: MYRBETRIQ Take 1 tablet (25 mg total) by mouth daily.   montelukast 10 MG tablet Commonly known as: SINGULAIR Take 1 tablet (10 mg total) by mouth daily.   NovoLOG 100 UNIT/ML injection Generic drug: insulin aspart Inject 12 Units into the skin 3 (three) times daily before meals.    Olopatadine HCl 0.2 % Soln Place 1 drop into both eyes daily.   omeprazole 20 MG capsule Commonly known as: PRILOSEC Take 1 capsule (20 mg total) by mouth daily.   predniSONE 10 MG tablet Commonly known as: DELTASONE Take 4 tablets (40 mg total) by mouth daily for 3 days, THEN 3 tablets (30 mg total) daily for 3 days, THEN 2 tablets (20 mg total) daily for 3 days, THEN 1 tablet (10 mg total) daily for 3 days. Start taking on: September 15, 2021   rosuvastatin 10 MG tablet Commonly known as: CRESTOR Take 1 tablet (10 mg total) by mouth daily.   Travatan Z 0.004 % Soln ophthalmic solution Generic drug: Travoprost (BAK Free) Place 1 drop into both eyes at bedtime.               Durable Medical Equipment  (From admission, onward)           Start     Ordered   09/15/21 1147  For home use only DME Nebulizer/meds  Once  Question Answer Comment  Patient needs a nebulizer to treat with the following condition COPD exacerbation ([place])   Length of Need Lifetime      09/15/21 1147            Allergies  Allergen Reactions   Ace Inhibitors Cough    Consultations: None  Procedures/Studies: DG Chest Portable 1 View  Result Date: 09/15/2021 CLINICAL DATA:  Acute dyspnea EXAM: PORTABLE CHEST 1 VIEW COMPARISON:  10/08/2020 FINDINGS: Lungs are clear.  No pleural effusion or pneumothorax. The heart is normal in size. IMPRESSION: No evidence of acute cardiopulmonary disease. Electronically Signed   By: Julian Hy M.D.   On: 09/15/2021 03:53     Subjective: No acute issues or events overnight denies nausea vomiting diarrhea constipation fevers chills chest pain   Discharge Exam: Vitals:   09/15/21 0800 09/15/21 1138  BP: (!) 147/92 (!) 160/90  Pulse: (!) 106 100  Resp: 18 18  Temp:    SpO2: 99% 93%   Vitals:   09/15/21 0615 09/15/21 0645 09/15/21 0800 09/15/21 1138  BP:   (!) 147/92 (!) 160/90  Pulse: 96 98 (!) 106 100  Resp: 16 16 18 18   Temp:       TempSrc:      SpO2: 95% 96% 99% 93%  Weight:      Height:        General: Pt is alert, awake, not in acute distress Cardiovascular: RRR, S1/S2 +, no rubs, no gallops Respiratory: CTA bilaterally, no wheezing, no rhonchi Abdominal: Soft, NT, ND, bowel sounds + Extremities: no edema, no cyanosis    The results of significant diagnostics from this hospitalization (including imaging, microbiology, ancillary and laboratory) are listed below for reference.     Microbiology: Recent Results (from the past 240 hour(s))  Resp Panel by RT-PCR (Flu A&B, Covid) Nasopharyngeal Swab     Status: None   Collection Time: 09/15/21  4:12 AM   Specimen: Nasopharyngeal Swab; Nasopharyngeal(NP) swabs in vial transport medium  Result Value Ref Range Status   SARS Coronavirus 2 by RT PCR NEGATIVE NEGATIVE Final    Comment: (NOTE) SARS-CoV-2 target nucleic acids are NOT DETECTED.  The SARS-CoV-2 RNA is generally detectable in upper respiratory specimens during the acute phase of infection. The lowest concentration of SARS-CoV-2 viral copies this assay can detect is 138 copies/mL. A negative result does not preclude SARS-Cov-2 infection and should not be used as the sole basis for treatment or other patient management decisions. A negative result may occur with  improper specimen collection/handling, submission of specimen other than nasopharyngeal swab, presence of viral mutation(s) within the areas targeted by this assay, and inadequate number of viral copies(<138 copies/mL). A negative result must be combined with clinical observations, patient history, and epidemiological information. The expected result is Negative.  Fact Sheet for Patients:  EntrepreneurPulse.com.au  Fact Sheet for Healthcare Providers:  IncredibleEmployment.be  This test is no t yet approved or cleared by the [place] FDA and  has been authorized for detection and/or diagnosis of  SARS-CoV-2 by FDA under an Emergency Use Authorization (EUA). This EUA will remain  in effect (meaning this test can be used) for the duration of the COVID-19 declaration under Section 564(b)(1) of the Act, 21 U.S.C.section 360bbb-3(b)(1), unless the authorization is terminated  or revoked sooner.       Influenza A by PCR NEGATIVE NEGATIVE Final   Influenza B by PCR NEGATIVE NEGATIVE Final    Comment: (NOTE) The Xpert Xpress SARS-CoV-2/FLU/RSV  plus assay is intended as an aid in the diagnosis of influenza from Nasopharyngeal swab specimens and should not be used as a sole basis for treatment. Nasal washings and aspirates are unacceptable for Xpert Xpress SARS-CoV-2/FLU/RSV testing.  Fact Sheet for Patients: EntrepreneurPulse.com.au  Fact Sheet for Healthcare Providers: IncredibleEmployment.be  This test is not yet approved or cleared by the [place] FDA and has been authorized for detection and/or diagnosis of SARS-CoV-2 by FDA under an Emergency Use Authorization (EUA). This EUA will remain in effect (meaning this test can be used) for the duration of the COVID-19 declaration under Section 564(b)(1) of the Act, 21 U.S.C. section 360bbb-3(b)(1), unless the authorization is terminated or revoked.  Performed at [facility], [address] [place], [place] [address]      Labs: BNP (last 3 results) Recent Labs    09/15/21 0337  BNP 71.6   Basic Metabolic Panel: Recent Labs  Lab 09/15/21 0337  NA 138  K 4.4  CL 104  CO2 28  GLUCOSE 225*  BUN 22  CREATININE 0.97  CALCIUM 9.4   Liver Function Tests: Recent Labs  Lab 09/15/21 0337  AST 19  ALT 22  ALKPHOS 86  BILITOT 0.7  PROT 7.6  ALBUMIN 3.8   No results for input(s): LIPASE, AMYLASE in the last 168 hours. No results for input(s): AMMONIA in the last 168 hours. CBC: Recent Labs  Lab 09/15/21 0337  WBC 14.9*  NEUTROABS 9.2*  HGB 14.2  HCT 47.0*   MCV 84.8  PLT 209   Cardiac Enzymes: No results for input(s): CKTOTAL, CKMB, CKMBINDEX, TROPONINI in the last 168 hours. BNP: Invalid input(s): POCBNP CBG: Recent Labs  Lab 09/15/21 0814 09/15/21 1148  GLUCAP 275* 321*   D-Dimer No results for input(s): DDIMER in the last 72 hours. Hgb A1c No results for input(s): HGBA1C in the last 72 hours. Lipid Profile No results for input(s): CHOL, HDL, LDLCALC, TRIG, CHOLHDL, LDLDIRECT in the last 72 hours. Thyroid function studies No results for input(s): TSH, T4TOTAL, T3FREE, THYROIDAB in the last 72 hours.  Invalid input(s): FREET3 Anemia work up No results for input(s): VITAMINB12, FOLATE, FERRITIN, TIBC, IRON, RETICCTPCT in the last 72 hours. Urinalysis    Component Value Date/Time   COLORURINE YELLOW (A) 09/21/2020 1841   APPEARANCEUR Cloudy (A) 07/28/2021 1427   LABSPEC 1.015 09/21/2020 1841   PHURINE 5.0 09/21/2020 1841   GLUCOSEU 2+ (A) 07/28/2021 1427   HGBUR SMALL (A) 09/21/2020 1841   BILIRUBINUR Negative 07/28/2021 1427   KETONESUR NEGATIVE 09/21/2020 1841   PROTEINUR Negative 07/28/2021 1427   PROTEINUR NEGATIVE 09/21/2020 1841   NITRITE Negative 07/28/2021 1427   NITRITE POSITIVE (A) 09/21/2020 1841   LEUKOCYTESUR Trace (A) 07/28/2021 1427   LEUKOCYTESUR TRACE (A) 09/21/2020 1841   Sepsis Labs Invalid input(s): PROCALCITONIN,  WBC,  LACTICIDVEN Microbiology Recent Results (from the past 240 hour(s))  Resp Panel by RT-PCR (Flu A&B, Covid) Nasopharyngeal Swab     Status: None   Collection Time: 09/15/21  4:12 AM   Specimen: Nasopharyngeal Swab; Nasopharyngeal(NP) swabs in vial transport medium  Result Value Ref Range Status   SARS Coronavirus 2 by RT PCR NEGATIVE NEGATIVE Final    Comment: (NOTE) SARS-CoV-2 target nucleic acids are NOT DETECTED.  The SARS-CoV-2 RNA is generally detectable in upper respiratory specimens during the acute phase of infection. The lowest concentration of SARS-CoV-2 viral  copies this assay can detect is 138 copies/mL. A negative result does not preclude SARS-Cov-2 infection  and should not be used as the sole basis for treatment or other patient management decisions. A negative result may occur with  improper specimen collection/handling, submission of specimen other than nasopharyngeal swab, presence of viral mutation(s) within the areas targeted by this assay, and inadequate number of viral copies(<138 copies/mL). A negative result must be combined with clinical observations, patient history, and epidemiological information. The expected result is Negative.  Fact Sheet for Patients:  EntrepreneurPulse.com.au  Fact Sheet for Healthcare Providers:  IncredibleEmployment.be  This test is no t yet approved or cleared by the [place] FDA and  has been authorized for detection and/or diagnosis of SARS-CoV-2 by FDA under an Emergency Use Authorization (EUA). This EUA will remain  in effect (meaning this test can be used) for the duration of the COVID-19 declaration under Section 564(b)(1) of the Act, 21 U.S.C.section 360bbb-3(b)(1), unless the authorization is terminated  or revoked sooner.       Influenza A by PCR NEGATIVE NEGATIVE Final   Influenza B by PCR NEGATIVE NEGATIVE Final    Comment: (NOTE) The Xpert Xpress SARS-CoV-2/FLU/RSV plus assay is intended as an aid in the diagnosis of influenza from Nasopharyngeal swab specimens and should not be used as a sole basis for treatment. Nasal washings and aspirates are unacceptable for Xpert Xpress SARS-CoV-2/FLU/RSV testing.  Fact Sheet for Patients: EntrepreneurPulse.com.au  Fact Sheet for Healthcare Providers: IncredibleEmployment.be  This test is not yet approved or cleared by the [place] FDA and has been authorized for detection and/or diagnosis of SARS-CoV-2 by FDA under an Emergency Use Authorization (EUA). This  EUA will remain in effect (meaning this test can be used) for the duration of the COVID-19 declaration under Section 564(b)(1) of the Act, 21 U.S.C. section 360bbb-3(b)(1), unless the authorization is terminated or revoked.  Performed at [facility], [address] [place], [place] [address]      Time coordinating discharge: Over 30 minutes  SIGNED:   Little Ishikawa, DO [facility] 09/15/2021, 3:19 PM Pager   If 7PM-7AM, please contact night-coverage [email or web address]

## 2021-09-15 NOTE — ED Triage Notes (Signed)
Pt presents to ER via ems from home c/o sob that has been going on since Wednesday and has become progressively worse.  Pt has hx of COPD and asthma and has used albuterol and advair at home without relief of symptoms.  Pt audibly wheezing in triage.  Pt O2 sats 88% on RA in triage.

## 2021-09-15 NOTE — ED Provider Notes (Signed)
Emergency Medicine Provider Triage Evaluation Note  Rachel Nielsen , a 70 y.o. female  was evaluated in triage.  Pt complains of SOB.  Hx of COPD.  Symptoms been worsening for the last couple days.  Acute worsening tonight.  Albuterol at home does not seem to be helping.  No recent fever.  No pain, just severe shortness of breath.  Review of Systems  Positive: Shortness of breath, wheezing Negative: Chest pain, abdominal pain, nausea, vomiting, fever  Physical Exam  BP (!) 145/90   Pulse (!) 106   Temp 98.2 F (36.8 C) (Oral)   Resp (!) 24   Ht 1.626 m (5\' 4" )   Wt 98.4 kg   SpO2 (!) 88%   BMI 37.25 kg/m  Gen:   Awake, moderate respiratory distress Resp:  Increased respiratory effort with some retractions, wheezing throughout, decreased air movement. MSK:   Moves extremities without difficulty  Other:  No abdominal tenderness to palpation  Medical Decision Making  Medically screening exam initiated at 3:31 AM.  Appropriate orders placed.  Rachel Nielsen was informed that the remainder of the evaluation will be completed by another provider, this initial triage assessment does not replace that evaluation, and the importance of remaining in the ED until their evaluation is complete.  Patient appears to be having a COPD exacerbation with hypoxemia.  SPO2 on room air is 88%.  Patient is in moderate respiratory distress.  Informed triage nurse and charge nurse that the patient should have the next available bed on the main side.  I ordered lab work, chest x-ray, duo nebs, oxygen.   Hinda Kehr, MD 09/15/21 [phone]

## 2021-09-15 NOTE — TOC Initial Note (Signed)
Transition of Care [facility]) - Initial/Assessment Note    Patient Details  Name: Rachel Nielsen MRN: [id number] Date of Birth: [birth year]  [facility] CM/SW Contact:    Ova Freshwater Phone Number: [phone] 09/15/2021, 1:07 PM  Clinical Narrative:                  Patient presents to [facility] due to SOB. Patient was able to ambulate with PT, Sats at 94% room air. Patient will discharge with DME nebulizer.  CSW contacted [facility] and referral was confirmed.  [facility] will deliver DME to ED room.  ED RN updated.  Expected Discharge Plan: Home/Self Care Barriers to Discharge: No Barriers Identified   Patient Goals and CMS Choice        Expected Discharge Plan and Services Expected Discharge Plan: Home/Self Care In-house Referral: Clinical Social Work   Post Acute Care Choice: Durable Medical Equipment ([facility]) Living arrangements for the past 2 months: Single Family Home                                      Prior Living Arrangements/Services Living arrangements for the past 2 months: Single Family Home Lives with:: Self Patient language and need for interpreter reviewed:: Yes Do you feel safe going back to the place where you live?: Yes      Need for Family Participation in Patient Care: Yes (Comment) Care giver support system in place?: Yes (comment)   Criminal Activity/Legal Involvement Pertinent to Current Situation/Hospitalization: No - Comment as needed  Activities of Daily Living      Permission Sought/Granted Permission sought to share information with : Family Supports Permission granted to share information with : Yes, Verbal Permission Granted  Share Information with NAME: Jaquetta, Currier (Son)   [phone] [facility] Phone)  Permission granted to share info w AGENCY: [facility]        Emotional Assessment Appearance:: Appears stated age Attitude/Demeanor/Rapport: Engaged Affect (typically observed):  Stable Orientation: : Oriented to Self, Oriented to Place, Oriented to  Time, Oriented to Situation Alcohol / Substance Use: Not Applicable Psych Involvement: No (comment)  Admission diagnosis:  COPD with acute exacerbation ([place]) [J44.1] Patient Active Problem List   Diagnosis Date Noted   COPD with acute exacerbation ([place]) 09/15/2021   Acute respiratory failure with hypoxia ([place]) 09/15/2021   Mixed diabetic hyperlipidemia associated with type 2 diabetes mellitus ([place]) 07/09/2021   Chronic constipation 07/09/2021   Cervical radiculopathy 07/09/2021   Lumbar radiculopathy 07/09/2021   Class 2 severe obesity due to excess calories with serious comorbidity and body mass index (BMI) of 38.0 to 38.9 in adult ([place]) 07/09/2021   OSA (obstructive sleep apnea) 12/18/2020   Diabetic neuropathy ([place]) 12/17/2020   Centrilobular emphysema ([place]) 12/17/2020   GAD (generalized anxiety disorder) 12/17/2020   History of stroke 10/21/2020   Uncontrolled type 2 diabetes mellitus with hyperglycemia ([place]) 03/14/2019   Hypertension 12/21/2017   GERD (gastroesophageal reflux disease) 12/21/2017   Major depressive disorder, recurrent, moderate ([place]) 12/21/2017   PCP:  Jearld Fenton, NP Pharmacy:   [facility], [address] [place] [address] Phone: [phone] Fax: [facility] [place], [address] [place] [address] Phone: [phone] Fax: [phone]  Social Determinants of Health (SDOH) Interventions    Readmission Risk Interventions No flowsheet data found.   

## 2021-09-15 NOTE — H&P (Signed)
History and Physical    [name] WYO:[id number] DOB: 03-01-1951 DOA: 09/15/2021  PCP: Jearld Fenton, NP   Patient coming from: Home  I have personally briefly reviewed patient's relevant medical records in [place]  Chief Complaint: Shortness of breath and wheezing  HPI: Rachel Nielsen is a 70 y.o. female with medical history significant for DM, HTN, depression, COPD not on oxygen who presents to the ED with 3-day history of shortness of breath and wheezing typical for her COPD exacerbations but not responding to home inhalers.  She went to see her pulmonologist and received a nebulizer treatment there but symptoms continued to get worse.  She denied chest pain, fever or chills and denied lower extremity pain or swelling.  ED course: Upon arrival tachycardic at 106, tachypneic at 24 with O2 sats 88% on room air Blood work: Leukocytosis of 15,000 with blood glucose of 225.  BNP 19.  Otherwise unremarkable  EKG, personally viewed and interpreted: Sinus tachycardia at 104 with no acute ST-T wave changes  Imaging: Chest x-ray with no acute disease  Patient treated with duo nebs and Solu-Medrol but continued to have increased work of breathing.'s hospitalist consulted for admission.    Review of Systems: As per HPI otherwise all other systems on review of systems negative.    Past Medical History:  Diagnosis Date   Allergy    Anxiety    Asthma    Colon polyps    Constipation    COPD (chronic obstructive pulmonary disease) (HCC)    Depression    GERD (gastroesophageal reflux disease)    Glaucoma    Heart attack ([place])    Hypertension    Sleep apnea    Sleep apnea    Stroke [facility])    Urinary incontinence     Past Surgical History:  Procedure Laterality Date   APPENDECTOMY     CHOLECYSTECTOMY     COLONOSCOPY WITH PROPOFOL N/A 07/20/2018   Procedure: COLONOSCOPY WITH PROPOFOL;  Surgeon: Toledo, Benay Pike, MD;  Location: [facility] ENDOSCOPY;  Service: Gastroenterology;   Laterality: N/A;   ESOPHAGOGASTRODUODENOSCOPY N/A 07/20/2018   Procedure: ESOPHAGOGASTRODUODENOSCOPY (EGD);  Surgeon: Toledo, Benay Pike, MD;  Location: [facility] ENDOSCOPY;  Service: Gastroenterology;  Laterality: N/A;   GASTRECTOMY     TUBAL LIGATION     VAGINAL HYSTERECTOMY       reports that she has quit smoking. She has never used smokeless tobacco. She reports that she does not currently use alcohol. She reports that she does not use drugs.  Allergies  Allergen Reactions   Ace Inhibitors Cough    Family History  Problem Relation Age of Onset   Hypertension Mother    Atrial fibrillation Mother    Heart attack Mother    Hypertension Father    Stomach cancer Father    Diabetes Father    Liver cancer Father    Diabetes Sister    Hypertension Sister       Prior to Admission medications   Medication Sig Start Date End Date Taking? Authorizing Provider  Accu-Chek FastClix Lancets MISC Use to check blood sugar up to twice a day as directed 12/17/20   Olin Hauser, DO  ACCU-CHEK GUIDE test strip Use to check blood sugar up to twice a day as directed 12/17/20   Olin Hauser, DO  albuterol (VENTOLIN HFA) 108 (90 Base) MCG/ACT inhaler Inhale 2-4 puffs by mouth every 4 hours as needed for wheezing, cough, and/or shortness of breath 10/21/20  Malfi, Lupita Raider, FNP  ALPRAZolam Duanne Moron) 0.5 MG tablet Take 0.5-1 mg by mouth See admin instructions. Take 1 tablet (0.5mg ) by mouth twice daily and 2 tablets (1mg ) by mouth at bedtime    [provider]  amLODipine (NORVASC) 5 MG tablet Take 1 tablet (5 mg total) by mouth daily. 07/09/21   Jearld Fenton, NP  asenapine (SAPHRIS) 5 MG SUBL 24 hr tablet  04/22/21   [provider]  bisoprolol (ZEBETA) 10 MG tablet Take 1 tablet (10 mg total) by mouth daily. OFFICE VISIT NEEDED FOR ADDITIONAL REFILLS 07/09/21   Jearld Fenton, NP  buPROPion (WELLBUTRIN XL) 300 MG 24 hr tablet Take 1 tablet (300 mg total) by mouth  daily. 10/08/20   Carrie Mew, MD  busPIRone (BUSPAR) 15 MG tablet Take 15 mg by mouth 2 (two) times daily.  11/08/17   [provider]  dapagliflozin propanediol (FARXIGA) 5 MG TABS tablet Take 1 tablet (5 mg total) by mouth daily. 07/23/21   Jearld Fenton, NP  Dulaglutide (TRULICITY) 1.5 RJ/1.8AC SOPN inject 1.5 mg once weekly for diabetes 11/07/20   Malfi, Lupita Raider, FNP  DULoxetine (CYMBALTA) 30 MG capsule Take 1 capsule by mouth every morning. 05/22/21 05/22/22  [provider]  fluconazole (DIFLUCAN) 100 MG tablet Take 1 tablet (100 mg total) by mouth daily. X 7 days 07/28/21   Bjorn Loser, MD  fluticasone (FLONASE) 50 MCG/ACT nasal spray Place 1 spray into both nostrils daily. 07/18/21   Jearld Fenton, NP  gabapentin (NEURONTIN) 600 MG tablet Take 300 mg in the morning and 600 mg at night 12/16/20   [provider]  GLOBAL EASE INJECT PEN NEEDLES 32G X 4 MM MISC Use as directed with insulin 05/30/20   Scarboro, Audie Clear, NP  insulin glargine (LANTUS) 100 UNIT/ML injection Inject 0.36 mLs (36 Units total) into the skin daily. 07/18/21   Jearld Fenton, NP  insulin lispro (HUMALOG) 100 UNIT/ML injection Inject 0.12 mLs (12 Units total) into the skin 3 (three) times daily before meals. 10/08/20   Carrie Mew, MD  Insulin Pen Needle (BD PEN NEEDLE NANO U/F) 32G X 4 MM MISC Inject 1 Device into the skin daily. 05/19/21   Jearld Fenton, NP  Insulin Syringe-Needle U-100 (INSULIN SYRINGE 1CC/31GX5/16") 31G X 5/16" 1 ML MISC 1 Syringe by Does not apply route in the morning, at noon, in the evening, and at bedtime. 06/24/21   Jearld Fenton, NP  LATUDA 40 MG TABS tablet Take 40 mg by mouth at bedtime. 10/02/20   [provider]  linaclotide Rolan Lipa) 290 MCG CAPS capsule Take 1 capsule (290 mcg total) by mouth daily before breakfast. 07/23/21   Jearld Fenton, NP  meloxicam (MOBIC) 15 MG tablet Take 1 tablet (15 mg total) by mouth at bedtime. 01/28/21    Karamalegos, Devonne Doughty, DO  metFORMIN (GLUCOPHAGE) 500 MG tablet 500 mg 2 (two) times daily with meals 12/21/17   [provider]  mirabegron ER (MYRBETRIQ) 25 MG TB24 tablet Take 1 tablet (25 mg total) by mouth daily. 07/28/21   MacDiarmid, Nicki Reaper, MD  montelukast (SINGULAIR) 10 MG tablet Take 1 tablet (10 mg total) by mouth daily. 10/08/20   Carrie Mew, MD  Olopatadine HCl 0.2 % SOLN Place 1 drop into both eyes daily. 11/07/20   Malfi, Lupita Raider, FNP  omeprazole (PRILOSEC) 20 MG capsule Take 1 capsule (20 mg total) by mouth daily. 07/09/21   Jearld Fenton, NP  pregabalin (LYRICA) 25 MG capsule Take by mouth. 07/15/21 07/15/22  [provider]  rosuvastatin (CRESTOR) 10 MG tablet Take 1 tablet (10 mg total) by mouth daily. 04/04/21   Karamalegos, Devonne Doughty, DO  traMADol (ULTRAM) 50 MG tablet Take 50 mg twice a day 03/26/21   [provider]  TRAVATAN Z 0.004 % SOLN ophthalmic solution Place 1 drop into both eyes at bedtime. 10/08/20   Carrie Mew, MD    Physical Exam: Vitals:   09/15/21 0415 09/15/21 0422 09/15/21 0430 09/15/21 0500  BP:   (!) 158/99 (!) 162/83  Pulse: 96 96 98 96  Resp: 16 17 16 15   Temp:      TempSrc:      SpO2: 95% 94% 95% 96%  Weight:      Height:       Constitutional: Alert and oriented x 3 .  Conversational dyspnea HEENT:      Head: Normocephalic and atraumatic.         Eyes: PERLA, EOMI, Conjunctivae are normal. Sclera is non-icteric.       Mouth/Throat: Mucous membranes are moist.       Neck: Supple with no signs of meningismus. Cardiovascular: Regular rate and rhythm. No murmurs, gallops, or rubs. 2+ symmetrical distal pulses are present . No JVD. No  LE edema Respiratory: Respiratory effort increased.tachypneic, speaking in short sentences.  Decreased air movement.  Few scattered rhonchi Gastrointestinal: Soft, non tender, non distended. Positive bowel sounds.  Genitourinary: No CVA tenderness. Musculoskeletal: Nontender  with normal range of motion in all extremities. No cyanosis, or erythema of extremities. Neurologic:  Face is symmetric. Moving all extremities. No gross focal neurologic deficits . Skin: Skin is warm, dry.  No rash or ulcers Psychiatric: Mood and affect are appropriate    Labs on Admission: I have personally reviewed following labs and imaging studies  CBC: Recent Labs  Lab 09/15/21 0337  WBC 14.9*  NEUTROABS 9.2*  HGB 14.2  HCT 47.0*  MCV 84.8  PLT 287   Basic Metabolic Panel: Recent Labs  Lab 09/15/21 0337  NA 138  K 4.4  CL 104  CO2 28  GLUCOSE 225*  BUN 22  CREATININE 0.97  CALCIUM 9.4   GFR: Estimated Creatinine Clearance: 63.3 mL/min (by C-G formula based on SCr of 0.97 mg/dL). Liver Function Tests: Recent Labs  Lab 09/15/21 0337  AST 19  ALT 22  ALKPHOS 86  BILITOT 0.7  PROT 7.6  ALBUMIN 3.8   No results for input(s): LIPASE, AMYLASE in the last 168 hours. No results for input(s): AMMONIA in the last 168 hours. Coagulation Profile: No results for input(s): INR, PROTIME in the last 168 hours. Cardiac Enzymes: No results for input(s): CKTOTAL, CKMB, CKMBINDEX, TROPONINI in the last 168 hours. BNP (last 3 results) No results for input(s): PROBNP in the last 8760 hours. HbA1C: No results for input(s): HGBA1C in the last 72 hours. CBG: No results for input(s): GLUCAP in the last 168 hours. Lipid Profile: No results for input(s): CHOL, HDL, LDLCALC, TRIG, CHOLHDL, LDLDIRECT in the last 72 hours. Thyroid Function Tests: No results for input(s): TSH, T4TOTAL, FREET4, T3FREE, THYROIDAB in the last 72 hours. Anemia Panel: No results for input(s): VITAMINB12, FOLATE, FERRITIN, TIBC, IRON, RETICCTPCT in the last 72 hours. Urine analysis:    Component Value Date/Time   COLORURINE YELLOW (A) 09/21/2020 1841   APPEARANCEUR Cloudy (A) 07/28/2021 1427   LABSPEC 1.015 09/21/2020 1841   PHURINE 5.0 09/21/2020 1841   GLUCOSEU  2+ (A) 07/28/2021 1427   HGBUR  SMALL (A) 09/21/2020 1841   BILIRUBINUR Negative 07/28/2021 [place] 09/21/2020 1841   PROTEINUR Negative 07/28/2021 1427   PROTEINUR NEGATIVE 09/21/2020 1841   NITRITE Negative 07/28/2021 1427   NITRITE POSITIVE (A) 09/21/2020 1841   LEUKOCYTESUR Trace (A) 07/28/2021 1427   LEUKOCYTESUR TRACE (A) 09/21/2020 1841    Radiological Exams on Admission: DG Chest Portable 1 View  Result Date: 09/15/2021 CLINICAL DATA:  Acute dyspnea EXAM: PORTABLE CHEST 1 VIEW COMPARISON:  10/08/2020 FINDINGS: Lungs are clear.  No pleural effusion or pneumothorax. The heart is normal in size. IMPRESSION: No evidence of acute cardiopulmonary disease. Electronically Signed   By: Julian Hy M.D.   On: 09/15/2021 03:53    Assessment/Plan    COPD with acute exacerbation (HCC)   Acute respiratory failure with hypoxia  -Patient with increased work of breathing, tachypneic, O2 sat 88% on room air requiring O2 at 2 L to maintain sats in the mid 90s - Scheduled and as needed nebulized bronchodilator treatments - IV steroids - Supplemental O2 to keep sats over 92%    Hypertension - Continue home bisoprolol    Uncontrolled type 2 diabetes mellitus with hyperglycemia (HCC) - Sliding scale insulin coverage    GAD (generalized anxiety disorder) - Xanax as needed and continue other meds pending med rec    DVT prophylaxis: Lovenox  Code Status: full code  Family Communication:  none  Disposition Plan: Back to previous home environment Consults called: none  Status:At the time of admission, it appears that the appropriate admission status for this patient is INPATIENT. This is judged to be reasonable and necessary in order to provide the required intensity of service to ensure the patient's safety given the presenting symptoms, physical exam findings, and initial radiographic and laboratory data in the context of their  Comorbid conditions.   Patient requires inpatient status due to high  intensity of service, high risk for further deterioration and high frequency of surveillance required.   I certify that at the point of admission it is my clinical judgment that the patient will require inpatient hospital care spanning beyond [place] MD [facility]   09/15/2021, 5:12 AM

## 2021-09-15 NOTE — Care Management Obs Status (Signed)
[place] NOTIFICATION   Patient Details  Name: Rachel Nielsen MRN: [id number] Date of Birth: 02-11-1951   Medicare Observation Status Notification Given:  Yes    Ova Freshwater 09/15/2021, 3:52 PM

## 2021-09-15 NOTE — Progress Notes (Signed)
Anticoagulation monitoring(Lovenox):  70 yo female ordered Lovenox 40 mg Q24h    Filed Weights   09/15/21 0330  Weight: 98.4 kg (217 lb)   BMI 37.25    Lab Results  Component Value Date   CREATININE 0.97 09/15/2021   CREATININE 1.06 (H) 10/08/2020   CREATININE 1.09 (H) 09/21/2020   Estimated Creatinine Clearance: 63.3 mL/min (by C-G formula based on SCr of 0.97 mg/dL). Hemoglobin & Hematocrit     Component Value Date/Time   HGB 14.2 09/15/2021 0337   HCT 47.0 (H) 09/15/2021 2706     Per Protocol for Patient with estCrcl > 30 ml/min and BMI > 30, will transition to Lovenox 50 mg Q24h.

## 2021-09-15 NOTE — ED Notes (Signed)
Pt ambulated without any shob or difficulty with PT. Sats maintained 94% on RA while ambulating.

## 2021-09-15 NOTE — TOC Transition Note (Signed)
Transition of Care [facility]) - Progression Note    Patient Details  Name: Rachel Nielsen MRN: [id number] Date of Birth: Nov 24, 1950  Transition of Care [facility]) CM/SW [place], [facility] Phone Number: [phone] 09/15/2021, 3:43 PM  Clinical Narrative:     Patient will d/c home with PT/OT home health, Azucena Kuba confirmed.  Patient will also discharge with DME nebulizer, [employer] [facility], delivered to room. Attending/ED RN updated.  Expected Discharge Plan: Home/Self Care Barriers to Discharge: No Barriers Identified  Expected Discharge Plan and Services Expected Discharge Plan: Home/Self Care In-house Referral: Clinical Social Work   Post Acute Care Choice: Durable Medical Equipment ([facility]) Living arrangements for the past 2 months: Single Family Home Expected Discharge Date: 09/15/21                                     Social Determinants of Health (SDOH) Interventions    Readmission Risk Interventions No flowsheet data found.

## 2021-09-15 NOTE — ED Notes (Signed)
Pt pending discharge, will organize SVN at home prior to. Pt eating breakfast tray.

## 2021-09-15 NOTE — Evaluation (Signed)
Occupational Therapy Evaluation Patient Details Name: Rachel Nielsen MRN: [id number] DOB: 1950/11/26 Today's Date: 09/15/2021   History of Present Illness Pt is a 70 y.o. female with medical history significant for DM, HTN, depression, COPD not on oxygen who presents to the ED with 3-day history of shortness of breath and wheezing typical for her COPD exacerbations but not responding to home inhalers. MD assessment includes: COPD with acute exacerbation and acute respiratory failure with hypoxia.   Clinical Impression   Pt seen for OT evaluation this date. Prior to admission, pt was independent in all ADLs (spongebathing at baseline) and mod-independent with SPC/RW for functional mobility, living in a 1-story home with son. Upon arrival to room, pt reported that she was incontinent of urine. Pt required SUPERVISION for seated UB dressing, MIN GUARD for sit>stand LB dressing, MIN GUARD for functional mobility of short household distances with RW, and SUPERVISION for standing hand hygiene due to decreased activity tolerance and balance. Soiled chuck pads replaced and pt left sitting EOB, in no acute distress, with all needs within reach at end of session. Pt would benefit from additional skilled OT services to maximize return to PLOF and minimize risk of future falls, injury, caregiver burden, and readmission. Upon discharge, recommend [place] services.         Recommendations for follow up therapy are one component of a multi-disciplinary discharge planning process, led by the attending physician.  Recommendations may be updated based on patient status, additional functional criteria and insurance authorization.   Follow Up Recommendations  Home health OT    Assistance Recommended at Discharge Set up Supervision/Assistance  Functional Status Assessment  Patient has had a recent decline in their functional status and demonstrates the ability to make significant improvements in function in a reasonable  and predictable amount of time.  Equipment Recommendations  None recommended by OT       Precautions / Restrictions Precautions Precautions: Fall Restrictions Weight Bearing Restrictions: No      Mobility Bed Mobility Overal bed mobility: Modified Independent             General bed mobility comments: Increased time and effort    Transfers Overall transfer level: Needs assistance Equipment used: Rolling walker (2 wheels) Transfers: Sit to/from Stand Sit to Stand: Min guard           General transfer comment: Increased time and effort, min guard for safety      Balance Overall balance assessment: Needs assistance Sitting-balance support: Feet supported;No upper extremity supported Sitting balance-Leahy Scale: Good Sitting balance - Comments: Good sitting balance reaching outside BOS during LB dressing   Standing balance support: Bilateral upper extremity supported;During functional activity Standing balance-Leahy Scale: Fair Standing balance comment: with b/l UE support on RW, pt requires MIN GUARD                           ADL either performed or assessed with clinical judgement   ADL Overall ADL's : Needs assistance/impaired     Grooming: Wash/dry hands;Min guard;Standing           Upper Body Dressing : Supervision/safety;Set up;Sitting Upper Body Dressing Details (indicate cue type and reason): to don/doff hopsital gown Lower Body Dressing: Min guard;Sit to/from stand Lower Body Dressing Details (indicate cue type and reason): to don/doff briefs.             Functional mobility during ADLs: Min guard;Rolling walker (2 wheels) (to walk 34ft)  Vision Patient Visual Report: No change from baseline              Pertinent Vitals/Pain Pain Assessment: No/denies pain        Extremity/Trunk Assessment Upper Extremity Assessment Upper Extremity Assessment: Overall WFL for tasks assessed   Lower Extremity Assessment Lower  Extremity Assessment: Generalized weakness       Communication Communication Communication: No difficulties   Cognition Arousal/Alertness: Awake/alert Behavior During Therapy: WFL for tasks assessed/performed Overall Cognitive Status: Within Functional Limits for tasks assessed                                 General Comments: Plesant and agreeable throughout     General Comments  SpO2 >92% on RA. HR 128 after functional mobility of short household distances            [place] expects to be discharged to:: Private residence Living Arrangements: Children Available Help at Discharge: Family;Available 24 hours/day (Son)   Home Access: Stairs to enter [employer] of Steps: 2 Entrance Stairs-Rails: None Home Layout: One level     Bathroom Shower/Tub: Teacher, early years/pre: Standard     Home Equipment: Conservation officer, nature (2 wheels);Cane - single point;Tub bench          Prior Functioning/Environment Prior Level of Function : History of Falls (last six months);Needs assist;Independent/Modified Independent             Mobility Comments: Pt is a limited community ambulator and has her son do the grocery shopping, uses SPC to get down the stairs, uses RW in the house, pt reports having had 8 falls in the past 6 months due to balance issues ADLs Comments: Pt reports being independent with dressing, spongebathing, and grooming tasks.        OT Problem List: Decreased activity tolerance;Impaired balance (sitting and/or standing)      OT Treatment/Interventions: Self-care/ADL training;Therapeutic exercise;Energy conservation;DME and/or AE instruction;Therapeutic activities;Patient/family education;Balance training    OT Goals(Current goals can be found in the care plan section) Acute Rehab OT Goals Patient Stated Goal: to return home OT Goal Formulation: With patient Time For Goal Achievement: 09/29/21 Potential to  Achieve Goals: Good ADL Goals Pt Will Perform Grooming: with modified independence;standing Pt Will Perform Lower Body Dressing: with modified independence;sit to/from stand Pt Will Perform Toileting - Clothing Manipulation and hygiene: with modified independence;sit to/from stand  OT Frequency: Min 2X/week    AM-PAC OT "6 Clicks" Daily Activity     Outcome Measure Help from another person eating meals?: None Help from another person taking care of personal grooming?: A Little Help from another person toileting, which includes using toliet, bedpan, or urinal?: A Little Help from another person bathing (including washing, rinsing, drying)?: A Lot Help from another person to put on and taking off regular upper body clothing?: None Help from another person to put on and taking off regular lower body clothing?: A Little 6 Click Score: 19   End of Session Equipment Utilized During Treatment: Rolling walker (2 wheels) Nurse Communication: Mobility status;Other (comment) (vitals during mobility)  Activity Tolerance: Patient tolerated treatment well Patient left: in bed;with call bell/phone within reach  OT Visit Diagnosis: Unsteadiness on feet (R26.81);History of falling (Z91.81)                Time: 1415-1450 OT Time Calculation (min): 35 min Charges:  OT General Charges $OT Visit:  1 Visit OT Treatments $Self Care/Home Management : 23-37 mins  Fredirick Maudlin, OTR/L [place]

## 2021-09-15 NOTE — ED Notes (Signed)
Not able to ambulate pt at this time due to shob. Pt states she is too short of breath at this time to walk.

## 2021-09-15 NOTE — Progress Notes (Signed)
Inpatient Diabetes Program Recommendations  AACE/ADA: New Consensus Statement on Inpatient Glycemic Control  Target Ranges:  Prepandial:   less than 140 mg/dL      Peak postprandial:   less than 180 mg/dL (1-2 hours)      Critically ill patients:  140 - 180 mg/dL   Results for Rachel Nielsen, Rachel Nielsen (MRN [id number]) as of 09/15/2021 10:14  Ref. Range 09/15/2021 08:14  Glucose-Capillary Latest Ref Range: 70 - 99 mg/dL 275 (H)    Review of Glycemic Control  Diabetes history: DM2 Outpatient Diabetes medications: Farxiga 5 mg BID, Metformin 500 mg BID, Lantus 36 units QHS, Humalog 12 units TID with meals (plus additional units for correction) Current orders for Inpatient glycemic control: Novolog 0-20 units TID with meals, Novolog 0-5 units QHS; Solumedrol 40 mg Q12H  Inpatient Diabetes Program Recommendations:    Insulin: Please consider ordering Semglee 15 units Q24H (based on 98.4 kg x 0.15 units).  NOTE: Patient admitted with COPDE, acute respiratory failure with hypoxia. Glucose 275 mg/dl this morning. Patient received Solumedrol 125 mg today at 3:43 am.  In reviewing chart, noted patient sees Dr. Gabriel Carina (Endocrinologist) and was last seen 09/08/21. Per office note on 09/08/21, patient was asked to increase Farxiga from 5 mg daily to 10 mg daily, started on Metformin 500 mg daily (increase to BID after one week), Lantus increased to 46 units QAM, Novolog 12 units TID with meals plus additional 2 units for every 50 mg/dl over glucose of 150 mg/dl, and told to stop Actos.   Thanks, Barnie Alderman, RN, MSN, CDE Diabetes Coordinator Inpatient Diabetes Program [phone] (Team Pager from 8am to 5pm)

## 2021-09-15 NOTE — ED Provider Notes (Signed)
[facility] Emergency Department Provider Note  ____________________________________________   Event Date/Time   First MD Initiated Contact with Patient 09/15/21 [phone]     (approximate)  I have reviewed the triage vital signs and the nursing notes.   HISTORY  Chief Complaint Shortness of Breath    HPI Rachel Nielsen is a 70 y.o. female with COPD who comes in with shortness of breath.  Patient reports having 3 days of coughing and shortness of breath, constant, worse with exertion, better at rest.  Reports taking her inhalers without relief in symptoms.  Stated that she saw her pulmonary doctor 2 days ago and actually got a breathing treatment there but symptoms continued to get worse.  Denies any fevers.  Denies any oral leg swelling or history of blood clots.          Past Medical History:  Diagnosis Date   Allergy    Anxiety    Asthma    Colon polyps    Constipation    COPD (chronic obstructive pulmonary disease) (HCC)    Depression    GERD (gastroesophageal reflux disease)    Glaucoma    Heart attack ([place])    Hypertension    Sleep apnea    Sleep apnea    Stroke [facility])    Urinary incontinence     Patient Active Problem List   Diagnosis Date Noted   Mixed diabetic hyperlipidemia associated with type 2 diabetes mellitus ([place]) 07/09/2021   Chronic constipation 07/09/2021   Cervical radiculopathy 07/09/2021   Lumbar radiculopathy 07/09/2021   Class 2 severe obesity due to excess calories with serious comorbidity and body mass index (BMI) of 38.0 to 38.9 in adult ([place]) 07/09/2021   OSA (obstructive sleep apnea) 12/18/2020   Diabetic neuropathy ([place]) 12/17/2020   Centrilobular emphysema ([place]) 12/17/2020   GAD (generalized anxiety disorder) 12/17/2020   History of stroke 10/21/2020   Uncontrolled type 2 diabetes mellitus with hyperglycemia ([place]) 03/14/2019   Hypertension 12/21/2017   GERD (gastroesophageal reflux disease) 12/21/2017    Major depressive disorder, recurrent, moderate ([place]) 12/21/2017    Past Surgical History:  Procedure Laterality Date   APPENDECTOMY     CHOLECYSTECTOMY     COLONOSCOPY WITH PROPOFOL N/A 07/20/2018   Procedure: COLONOSCOPY WITH PROPOFOL;  Surgeon: Toledo, Benay Pike, MD;  Location: [facility] ENDOSCOPY;  Service: Gastroenterology;  Laterality: N/A;   ESOPHAGOGASTRODUODENOSCOPY N/A 07/20/2018   Procedure: ESOPHAGOGASTRODUODENOSCOPY (EGD);  Surgeon: Toledo, Benay Pike, MD;  Location: [facility] ENDOSCOPY;  Service: Gastroenterology;  Laterality: N/A;   GASTRECTOMY     TUBAL LIGATION     VAGINAL HYSTERECTOMY      Prior to Admission medications   Medication Sig Start Date End Date Taking? Authorizing Provider  Accu-Chek FastClix Lancets MISC Use to check blood sugar up to twice a day as directed 12/17/20   Olin Hauser, DO  ACCU-CHEK GUIDE test strip Use to check blood sugar up to twice a day as directed 12/17/20   Olin Hauser, DO  albuterol (VENTOLIN HFA) 108 (90 Base) MCG/ACT inhaler Inhale 2-4 puffs by mouth every 4 hours as needed for wheezing, cough, and/or shortness of breath 10/21/20   Malfi, Lupita Raider, FNP  ALPRAZolam Duanne Moron) 0.5 MG tablet Take 0.5-1 mg by mouth See admin instructions. Take 1 tablet (0.5mg ) by mouth twice daily and 2 tablets (1mg ) by mouth at bedtime    [provider]  amLODipine (NORVASC) 5 MG tablet Take 1 tablet (5 mg total) by mouth  daily. 07/09/21   Jearld Fenton, NP  asenapine (SAPHRIS) 5 MG SUBL 24 hr tablet  04/22/21   [provider]  bisoprolol (ZEBETA) 10 MG tablet Take 1 tablet (10 mg total) by mouth daily. OFFICE VISIT NEEDED FOR ADDITIONAL REFILLS 07/09/21   Jearld Fenton, NP  buPROPion (WELLBUTRIN XL) 300 MG 24 hr tablet Take 1 tablet (300 mg total) by mouth daily. 10/08/20   Carrie Mew, MD  busPIRone (BUSPAR) 15 MG tablet Take 15 mg by mouth 2 (two) times daily.  11/08/17   [provider]  dapagliflozin propanediol  (FARXIGA) 5 MG TABS tablet Take 1 tablet (5 mg total) by mouth daily. 07/23/21   Jearld Fenton, NP  Dulaglutide (TRULICITY) 1.5 ME/2.6ST SOPN inject 1.5 mg once weekly for diabetes 11/07/20   Malfi, Lupita Raider, FNP  DULoxetine (CYMBALTA) 30 MG capsule Take 1 capsule by mouth every morning. 05/22/21 05/22/22  [provider]  fluconazole (DIFLUCAN) 100 MG tablet Take 1 tablet (100 mg total) by mouth daily. X 7 days 07/28/21   Bjorn Loser, MD  fluticasone (FLONASE) 50 MCG/ACT nasal spray Place 1 spray into both nostrils daily. 07/18/21   Jearld Fenton, NP  gabapentin (NEURONTIN) 600 MG tablet Take 300 mg in the morning and 600 mg at night 12/16/20   [provider]  GLOBAL EASE INJECT PEN NEEDLES 32G X 4 MM MISC Use as directed with insulin 05/30/20   [place], Audie Clear, NP  insulin glargine (LANTUS) 100 UNIT/ML injection Inject 0.36 mLs (36 Units total) into the skin daily. 07/18/21   Jearld Fenton, NP  insulin lispro (HUMALOG) 100 UNIT/ML injection Inject 0.12 mLs (12 Units total) into the skin 3 (three) times daily before meals. 10/08/20   Carrie Mew, MD  Insulin Pen Needle (BD PEN NEEDLE NANO U/F) 32G X 4 MM MISC Inject 1 Device into the skin daily. 05/19/21   Jearld Fenton, NP  Insulin Syringe-Needle U-100 (INSULIN SYRINGE 1CC/31GX5/16") 31G X 5/16" 1 ML MISC 1 Syringe by Does not apply route in the morning, at noon, in the evening, and at bedtime. 06/24/21   Jearld Fenton, NP  LATUDA 40 MG TABS tablet Take 40 mg by mouth at bedtime. 10/02/20   [provider]  linaclotide Rolan Lipa) 290 MCG CAPS capsule Take 1 capsule (290 mcg total) by mouth daily before breakfast. 07/23/21   Jearld Fenton, NP  meloxicam (MOBIC) 15 MG tablet Take 1 tablet (15 mg total) by mouth at bedtime. 01/28/21   Karamalegos, Devonne Doughty, DO  metFORMIN (GLUCOPHAGE) 500 MG tablet 500 mg 2 (two) times daily with meals 12/21/17   [provider]  mirabegron ER (MYRBETRIQ) 25 MG TB24 tablet  Take 1 tablet (25 mg total) by mouth daily. 07/28/21   MacDiarmid, Nicki Reaper, MD  montelukast (SINGULAIR) 10 MG tablet Take 1 tablet (10 mg total) by mouth daily. 10/08/20   Carrie Mew, MD  Olopatadine HCl 0.2 % SOLN Place 1 drop into both eyes daily. 11/07/20   Malfi, Lupita Raider, FNP  omeprazole (PRILOSEC) 20 MG capsule Take 1 capsule (20 mg total) by mouth daily. 07/09/21   Jearld Fenton, NP  pregabalin (LYRICA) 25 MG capsule Take by mouth. 07/15/21 07/15/22  [provider]  rosuvastatin (CRESTOR) 10 MG tablet Take 1 tablet (10 mg total) by mouth daily. 04/04/21   Karamalegos, Devonne Doughty, DO  traMADol (ULTRAM) 50 MG tablet Take 50 mg twice a day 03/26/21   [provider]  TRAVATAN Z 0.004 % SOLN ophthalmic solution Place 1 drop into both eyes at bedtime. 10/08/20   Carrie Mew, MD    Allergies Ace inhibitors  Family History  Problem Relation Age of Onset   Hypertension Mother    Atrial fibrillation Mother    Heart attack Mother    Hypertension Father    Stomach cancer Father    Diabetes Father    Liver cancer Father    Diabetes Sister    Hypertension Sister     Social History Social History   Tobacco Use   Smoking status: Former   Smokeless tobacco: Never   Tobacco comments:    4 months ago  Vaping Use   Vaping Use: Some days  Substance Use Topics   Alcohol use: Not Currently    Comment: monthly or less    Drug use: No      Review of Systems Constitutional: No fever/chills Eyes: No visual changes. ENT: No sore throat. Cardiovascular: No chest pain Respiratory: Positive for SOB, cough Gastrointestinal: No abdominal pain.  No nausea, no vomiting.  No diarrhea.  No constipation. Genitourinary: Negative for dysuria. Musculoskeletal: Negative for back pain. Skin: Negative for rash. Neurological: Negative for headaches, focal weakness or numbness. All other ROS negative ____________________________________________   PHYSICAL EXAM:  VITAL  SIGNS: ED Triage Vitals  Enc Vitals Group     BP 09/15/21 0328 (!) 145/90     Pulse Rate 09/15/21 0328 (!) 106     Resp 09/15/21 0328 (!) 24     Temp 09/15/21 0328 98.2 F (36.8 C)     Temp Source 09/15/21 0328 Oral     SpO2 09/15/21 0328 (!) 88 %     Weight 09/15/21 0330 217 lb (98.4 kg)     Height 09/15/21 0330 5\' 4"  (1.626 m)     Head Circumference --      Peak Flow --      Pain Score 09/15/21 0330 0     Pain Loc --      Pain Edu? --      Excl. in [place]? --     Constitutional: Alert and oriented. Well appearing and in no acute distress. Eyes: Conjunctivae are normal. EOMI. Head: Atraumatic. Nose: No congestion/rhinnorhea. Mouth/Throat: Mucous membranes are moist.   Neck: No stridor. Trachea Midline. FROM Cardiovascular: Normal rate, regular rhythm. Grossly normal heart sounds.  Good peripheral circulation. Respiratory: Significant wheezing bilateral, tight air exchange, currently on 2 L Gastrointestinal: Soft and nontender. No distention. No abdominal bruits.  Musculoskeletal: No lower extremity tenderness nor edema.  No joint effusions. Neurologic:  Normal speech and language. No gross focal neurologic deficits are appreciated.  Skin:  Skin is warm, dry and intact. No rash noted. Psychiatric: Mood and affect are normal. Speech and behavior are normal. GU: Deferred   ____________________________________________   LABS (all labs ordered are listed, but only abnormal results are displayed)  Labs Reviewed  COMPREHENSIVE METABOLIC PANEL - Abnormal; Notable for the following components:      Result Value   Glucose, Bld 225 (*)    All other components within normal limits  CBC WITH DIFFERENTIAL/PLATELET - Abnormal; Notable for the following components:   WBC 14.9 (*)    RBC 5.54 (*)    HCT 47.0 (*)    MCH 25.6 (*)    Neutro Abs 9.2 (*)    Eosinophils Absolute 1.3 (*)    All other components within normal limits  BRAIN NATRIURETIC PEPTIDE  TROPONIN I (HIGH  SENSITIVITY)    ____________________________________________   ED ECG REPORT I, Vanessa [place], the attending physician, personally viewed and interpreted this ECG.  Sinus tachycardia rate of 104, no ST elevations, T wave inversions may be in the inferior leads but there is also little bit of artifact, normal intervals ____________________________________________  RADIOLOGY I, Vanessa [place], personally viewed and evaluated these images (plain radiographs) as part of my medical decision making, as well as reviewing the written report by the radiologist.  ED MD interpretation: No pneumonia  Official radiology report(s): DG Chest Portable 1 View  Result Date: 09/15/2021 CLINICAL DATA:  Acute dyspnea EXAM: PORTABLE CHEST 1 VIEW COMPARISON:  10/08/2020 FINDINGS: Lungs are clear.  No pleural effusion or pneumothorax. The heart is normal in size. IMPRESSION: No evidence of acute cardiopulmonary disease. Electronically Signed   By: Julian Hy M.D.   On: 09/15/2021 03:53    ____________________________________________   PROCEDURES  Procedure(s) performed (including Critical Care):  .Critical Care Performed by: Vanessa [place], MD Authorized by: Vanessa , MD   Critical care provider statement:    Critical care time (minutes):  30   Critical care was necessary to treat or prevent imminent or life-threatening deterioration of the following conditions:  Respiratory failure   Critical care was time spent personally by me on the following activities:  Development of treatment plan with patient or surrogate, discussions with consultants, evaluation of patient's response to treatment, examination of patient, ordering and review of laboratory studies, ordering and review of radiographic studies, ordering and performing treatments and interventions, pulse oximetry, re-evaluation of patient's condition and review of old charts   Care discussed with: admitting provider      ____________________________________________   INITIAL IMPRESSION / [place] / ED COURSE   CHEYANNA STRICK was evaluated in Emergency Department on 09/15/2021 for the symptoms described in the history of present illness. She was evaluated in the context of the global COVID-19 pandemic, which necessitated consideration that the patient might be at risk for infection with the SARS-CoV-2 virus that causes COVID-19. Institutional protocols and algorithms that pertain to the evaluation of patients at risk for COVID-19 are in a state of rapid change based on information released by regulatory bodies including the CDC and federal and state organizations. These policies and algorithms were followed during the patient's care in the ED.     Pt presents with SOB.  His lung exam I suspect this is from COPD.  Patient was already given 2 DuoNeb's and steroids and still have significant wheezing.  Currently only 93% on 2 L   differential includes: PNA-will get xray to evaluation Anemia-CBC to evaluate ACS- will get trops Arrhythmia-Will get EKG and keep on monitor.  COVID- will get testing per algorithm. PE-lower suspicion given no risk factors and other cause more likely       No evidence of ACS, no evidence of anemia.   Will give another DuoNeb and discuss to the hospital team for admission given the continued significant wheezing and shortness of breath       ____________________________________________   FINAL CLINICAL IMPRESSION(S) / ED DIAGNOSES   Final diagnoses:  Acute respiratory failure with hypoxia (HCC)  COPD exacerbation ([place])     MEDICATIONS GIVEN DURING THIS VISIT:  Medications  ipratropium-albuterol (DUONEB) 0.5-2.5 (3) MG/3ML nebulizer solution 3 mL (has no administration in time range)  ipratropium-albuterol (DUONEB) 0.5-2.5 (3) MG/3ML nebulizer solution 3 mL (3 mLs Nebulization Given 09/15/21 0343)  ipratropium-albuterol (DUONEB) 0.5-2.5 (  3) MG/3ML  nebulizer solution 3 mL (3 mLs Nebulization Given 09/15/21 0343)  methylPREDNISolone sodium succinate (SOLU-MEDROL) 125 mg/2 mL injection 125 mg (125 mg Intravenous Given 09/15/21 0343)     ED Discharge Orders     None        Note:  This document was prepared using Dragon voice recognition software and may include unintentional dictation errors.   Vanessa [place], MD 09/15/21 [phone]

## 2021-09-15 NOTE — Care Management CC44 (Signed)
Condition Code 44 Documentation Completed  Patient Details  Name: Rachel Nielsen MRN: [id number] Date of Birth: 02/08/1951   Condition Code 44 given:  Yes Patient signature on Condition Code 44 notice:  Yes Documentation of 2 MD's agreement:  Yes Code 44 added to claim:  Yes    Adelene Amas, [place] 09/15/2021, 3:52 PM

## 2021-09-15 NOTE — ED Notes (Signed)
Report to raquel, rn.

## 2021-09-15 NOTE — ED Notes (Signed)
Pt being discharged, awaiting son to transport home. Pt given svn machine and given instructions on use.

## 2021-09-15 NOTE — ED Notes (Signed)
PT in to eval

## 2021-09-15 NOTE — ED Notes (Addendum)
Patient to waiting room via wheelchair by EMS from home. EMS reports patient with complaint of shortness of breath, 92% on room air and difficulty speaking.  Up to 98% with nasal cannula.  Patient with duo neb.  BP 140/70,  hr 70.

## 2021-09-15 NOTE — ED Notes (Signed)
Hospitalist in to see pt.

## 2021-09-15 NOTE — ED Notes (Signed)
OT in to eval

## 2021-09-16 ENCOUNTER — Telehealth: Payer: Self-pay

## 2021-09-16 NOTE — Telephone Encounter (Signed)
Transition Care Management Follow-up Telephone Call Date of discharge and from where: 09/15/21 [facility] How have you been since you were released from the hospital? Been pretty much same Any questions or concerns? No  Items Reviewed: Did the pt receive and understand the discharge instructions provided? Yes  Medications obtained and verified? Yes  Other? No  Any new allergies since your discharge? No  Dietary orders reviewed? No Do you have support at home? Yes   Home Care and Equipment/Supplies: Were home health services ordered? yes If so, what is the name of the agency? [facility]  Has the agency set up a time to come to the patient's home? yes Were any new equipment or medical supplies ordered?  Yes: new breathing treatment (nebulizer)  Functional Questionnaire: (I = Independent and D = Dependent) ADLs: 1  Bathing/Dressing- 1  Meal Prep- 1  Eating- 1  Maintaining continence- 1  Transferring/Ambulation- 1  Managing Meds- 1  Follow up appointments reviewed:  PCP Hospital f/u appt confirmed? No  . [facility] f/u appt confirmed? Yes  Scheduled to see Dr.Fleming on 10/01/21. Are transportation arrangements needed? No  If their condition worsens, is the pt aware to call PCP or go to the Emergency Dept.? Yes Was the patient provided with contact information for the PCP's office or ED? Yes Was to pt encouraged to call back with questions or concerns? Yes

## 2021-09-18 DIAGNOSIS — F419 Anxiety disorder, unspecified: Secondary | ICD-10-CM | POA: Diagnosis not present

## 2021-09-18 DIAGNOSIS — F321 Major depressive disorder, single episode, moderate: Secondary | ICD-10-CM | POA: Diagnosis not present

## 2021-09-23 DIAGNOSIS — M5442 Lumbago with sciatica, left side: Secondary | ICD-10-CM | POA: Diagnosis not present

## 2021-09-23 DIAGNOSIS — M5412 Radiculopathy, cervical region: Secondary | ICD-10-CM | POA: Diagnosis not present

## 2021-09-23 DIAGNOSIS — M542 Cervicalgia: Secondary | ICD-10-CM | POA: Diagnosis not present

## 2021-09-23 DIAGNOSIS — M5441 Lumbago with sciatica, right side: Secondary | ICD-10-CM | POA: Diagnosis not present

## 2021-09-23 DIAGNOSIS — M9931 Osseous stenosis of neural canal of cervical region: Secondary | ICD-10-CM | POA: Diagnosis not present

## 2021-09-23 DIAGNOSIS — G8929 Other chronic pain: Secondary | ICD-10-CM | POA: Diagnosis not present

## 2021-09-26 ENCOUNTER — Other Ambulatory Visit: Payer: Self-pay | Admitting: Family Medicine

## 2021-09-26 DIAGNOSIS — E1165 Type 2 diabetes mellitus with hyperglycemia: Secondary | ICD-10-CM

## 2021-09-27 NOTE — Telephone Encounter (Signed)
Requested medication (s) are due for refill today: yes  Requested medication (s) are on the active medication list: yes  Last refill:  04/04/21 #90 1 RF  Future visit scheduled: yes  Notes to clinic:  needs lab work   Requested Prescriptions  Pending Prescriptions Disp Refills   rosuvastatin (CRESTOR) 10 MG tablet [Pharmacy Med Name: ROSUVASTATIN CALCIUM 10 MG TAB] 90 tablet 0    Sig: Take 1 tablet (10 mg total) by mouth daily.     Cardiovascular:  Antilipid - Statins Failed - 09/26/2021 11:46 AM      Failed - Total Cholesterol in normal range and within 360 days    No results found for: CHOL, POCCHOL, CHOLTOT        Failed - LDL in normal range and within 360 days    No results found for: LDLCALC, LDLC, HIRISKLDL, POCLDL, LDLDIRECT, REALLDLC, TOTLDLC        Failed - HDL in normal range and within 360 days    No results found for: HDL, POCHDL        Failed - Triglycerides in normal range and within 360 days    No results found for: TRIG, POCTRIG        Passed - Patient is not pregnant      Passed - Valid encounter within last 12 months    Recent Outpatient Visits           2 months ago Mixed diabetic hyperlipidemia associated with type 2 diabetes mellitus ([place])   [place], Coralie Keens, NP   9 months ago Uncontrolled type 2 diabetes mellitus with hyperglycemia [facility])   [place] [facility] Olin Hauser, DO   11 months ago Encounter to establish care with new doctor   [facility], Lupita Raider, FNP       Future Appointments             In 3 months  [facility], [place]   In 3 months [place], Coralie Keens, NP [facility], [facility]

## 2021-10-01 ENCOUNTER — Other Ambulatory Visit: Payer: Self-pay | Admitting: Internal Medicine

## 2021-10-01 NOTE — Telephone Encounter (Signed)
Copied from [place] [phone]. Topic: Quick Communication - Rx Refill/Question >> Oct 01, 2021  9:32 AM Tessa Lerner A wrote: Medication: omeprazole (PRILOSEC) 20 MG capsule [[id number]]   Has the patient contacted their pharmacy? No. The patient has been unable to reach their pharmacy    (Agent: If no, request that the patient contact the pharmacy for the refill. If patient does not wish to contact the pharmacy document the reason why and proceed with request.) (Agent: If yes, when and what did the pharmacy advise?)  Preferred Pharmacy (with phone number or street name): [place], [place]  Phone:  [phone] Fax:  [phone]    Has the patient been seen for an appointment in the last year OR does the patient have an upcoming appointment? Yes.    Agent: Please be advised that RX refills may take up to 3 business days. We ask that you follow-up with your pharmacy.

## 2021-10-02 MED ORDER — OMEPRAZOLE 20 MG PO CPDR: 20.0000 mg | DELAYED_RELEASE_CAPSULE | Freq: Every day | ORAL | 1 refills | Status: DC

## 2021-10-02 NOTE — Telephone Encounter (Signed)
Requested Prescriptions  Pending Prescriptions Disp Refills  . omeprazole (PRILOSEC) 20 MG capsule 90 capsule 1    Sig: Take 1 capsule (20 mg total) by mouth daily.     Gastroenterology: Proton Pump Inhibitors Passed - 10/01/2021  9:39 PM      Passed - Valid encounter within last 12 months    Recent Outpatient Visits          2 months ago Mixed diabetic hyperlipidemia associated with type 2 diabetes mellitus [facility])   [facility] [place], Coralie Keens, NP   9 months ago Uncontrolled type 2 diabetes mellitus with hyperglycemia [facility])   [facility] Olin Hauser, DO   11 months ago Encounter to establish care with new doctor   [place], FNP      Future Appointments            In 3 months  [facility], [place]   In 3 months [place], Coralie Keens, NP [facility], [facility]

## 2021-10-06 ENCOUNTER — Ambulatory Visit: Payer: Self-pay | Admitting: *Deleted

## 2021-10-06 ENCOUNTER — Other Ambulatory Visit: Payer: Self-pay | Admitting: Family Medicine

## 2021-10-06 DIAGNOSIS — F419 Anxiety disorder, unspecified: Secondary | ICD-10-CM | POA: Diagnosis not present

## 2021-10-06 DIAGNOSIS — E1165 Type 2 diabetes mellitus with hyperglycemia: Secondary | ICD-10-CM

## 2021-10-06 DIAGNOSIS — F321 Major depressive disorder, single episode, moderate: Secondary | ICD-10-CM | POA: Diagnosis not present

## 2021-10-06 NOTE — Telephone Encounter (Signed)
Reason for Disposition  [1] Abdominal pain is intermittent AND [2] shoots into chest, with sour taste in mouth  Answer Assessment - Initial Assessment Questions 1. LOCATION: "Where does it hurt?"      Pt calling in c/o indigestion.   I was switched to omeprazole but I'm still burping up food and liquid that I drink.    I also have gas.   It was switched about 2-3 months ago. 2. RADIATION: "Does the pain shoot anywhere else?" (e.g., chest, back)     My stomach hurts sometimes. 3. ONSET: "When did the pain begin?" (e.g., minutes, hours or days ago)      A month or so.   It's getting worse.    4. SUDDEN: "Gradual or sudden onset?"     Suddenly it hits me. 5. PATTERN "Does the pain come and go, or is it constant?"    - If constant: "Is it getting better, staying the same, or worsening?"      (Note: Constant means the pain never goes away completely; most serious pain is constant and it progresses)     - If intermittent: "How long does it last?" "Do you have pain now?"     (Note: Intermittent means the pain goes away completely between bouts)     Coming and going 6. SEVERITY: "How bad is the pain?"  (e.g., Scale 1-10; mild, moderate, or severe)    - MILD (1-3): doesn't interfere with normal activities, abdomen soft and not tender to touch     - MODERATE (4-7): interferes with normal activities or awakens from sleep, abdomen tender to touch     - SEVERE (8-10): excruciating pain, doubled over, unable to do any normal activities       Not asked  7. RECURRENT SYMPTOM: "Have you ever had this type of stomach pain before?" If Yes, ask: "When was the last time?" and "What happened that time?"      Yes I had these symptoms before starting the omeprazole 8. AGGRAVATING FACTORS: "Does anything seem to cause this pain?" (e.g., foods, stress, alcohol)     The worst is when I burp and the food and stuff comes up in my throat.   Laying down doesn't make it worse.   It happens no matter my position. 9.  CARDIAC SYMPTOMS: "Do you have any of the following symptoms: chest pain, difficulty breathing, sweating, nausea?"     Not asked 10. OTHER SYMPTOMS: "Do you have any other symptoms?" (e.g., back pain, diarrhea, fever, urination pain, vomiting)       I have not vomited.   11. PREGNANCY: "Is there any chance you are pregnant?" "When was your last menstrual period?"       N/A due to age  Protocols used: Abdominal Pain - Upper-A-AH

## 2021-10-06 NOTE — Telephone Encounter (Signed)
Requested Prescriptions  Pending Prescriptions Disp Refills  . ACCU-CHEK GUIDE test strip [Pharmacy Med Name: ACCU-CHEK GUIDE TEST STRIP] 100 strip 3    Sig: Use to check blood sugar up to twice a day as directed     Endocrinology: Diabetes - Testing Supplies Passed - 10/06/2021 11:44 AM      Passed - Valid encounter within last 12 months    Recent Outpatient Visits          2 months ago Mixed diabetic hyperlipidemia associated with type 2 diabetes mellitus [facility])   [facility] [place], Coralie Keens, NP   9 months ago Uncontrolled type 2 diabetes mellitus with hyperglycemia [facility])   [facility] Olin Hauser, DO   11 months ago Encounter to establish care with new doctor   [place], FNP      Future Appointments            In 3 months  [facility], [place]   In 3 months [place], Coralie Keens, NP [facility], [place]           . Accu-Chek Softclix Lancets lancets [Pharmacy Med Name: ACCU-CHEK SOFTCLIX LANCETS] 100 each 3    Sig: Use to check blood sugar up to twice a day as directed     Endocrinology: Diabetes - Testing Supplies Passed - 10/06/2021 11:44 AM      Passed - Valid encounter within last 12 months    Recent Outpatient Visits          2 months ago Mixed diabetic hyperlipidemia associated with type 2 diabetes mellitus [facility])   [facility] [place], Coralie Keens, NP   9 months ago Uncontrolled type 2 diabetes mellitus with hyperglycemia [facility])   [facility] Olin Hauser, DO   11 months ago Encounter to establish care with new doctor   [place], FNP      Future Appointments            In 3 months  [facility], [place]   In 3 months [place], Coralie Keens, NP [facility], [facility]

## 2021-10-06 NOTE — Telephone Encounter (Signed)
Pt called in requesting that the omeprazole be increased or changed because she is having indigestion with burping and the food and liquid coming up into her throat like before.   The omeprazole is not helping very much though she is better than what she was it's still not controlling the burping and indigestion.  She requested not to come in if possible.   She said she didn't feel good.   She went and had a injection in her neck for a pinched nerve.   Her oxygen level went down and EMS had to take her to the hospital.   She's been on prednisone.  She also mentioned she has had diarrhea for the last 3 days.   She is drinking energy drinks her son is getting for her.   "I just don't feel good and don't feel like trying to come in if possible.  I let her know I would send Webb Silversmith, NP her message and someone would call her back after checking with Rollene Fare.   Pt was agreeable to this plan.  I instructed her to continue drinking fluids and to eat light bland foods.   She verbalized understanding also when I instructed her to call us back if the diarrhea continues for another day or two that she will probably need to be seen.  She was agreeable to calling back.   Sent my notes to [employer].

## 2021-10-08 MED ORDER — OMEPRAZOLE 40 MG PO CPDR: 40.0000 mg | DELAYED_RELEASE_CAPSULE | Freq: Every day | ORAL | 0 refills | Status: DC

## 2021-10-08 NOTE — Telephone Encounter (Signed)
Omeprazole 40 mg sent to pharmacy.

## 2021-10-08 NOTE — Telephone Encounter (Signed)
I attempted to contact the patient to notify her that omeprazole was sent in. No answer, voicemail full.

## 2021-10-08 NOTE — Addendum Note (Signed)
Addended by: Jearld Fenton on: 10/08/2021 07:56 AM   Modules accepted: Orders

## 2021-10-13 ENCOUNTER — Other Ambulatory Visit: Payer: Self-pay | Admitting: Internal Medicine

## 2021-10-13 NOTE — Telephone Encounter (Signed)
Requested Prescriptions  Pending Prescriptions Disp Refills  . fluticasone (FLONASE) 50 MCG/ACT nasal spray [Pharmacy Med Name: FLUTICASONE PROP 50 MCG SPRAY] 16 g 2    Sig: Place 1 spray into both nostrils daily.     Ear, Nose, and Throat: Nasal Preparations - Corticosteroids Passed - 10/13/2021  1:51 PM      Passed - Valid encounter within last 12 months    Recent Outpatient Visits          3 months ago Mixed diabetic hyperlipidemia associated with type 2 diabetes mellitus [facility])   [facility] [place], Coralie Keens, NP   10 months ago Uncontrolled type 2 diabetes mellitus with hyperglycemia [facility])   [place], DO   11 months ago Encounter to establish care with new doctor   [place], FNP      Future Appointments            In 2 months  [facility], [place]   In 2 months [place], Coralie Keens, NP [facility], [facility]

## 2021-10-20 DIAGNOSIS — F321 Major depressive disorder, single episode, moderate: Secondary | ICD-10-CM | POA: Diagnosis not present

## 2021-10-20 DIAGNOSIS — F419 Anxiety disorder, unspecified: Secondary | ICD-10-CM | POA: Diagnosis not present

## 2021-11-07 DIAGNOSIS — F419 Anxiety disorder, unspecified: Secondary | ICD-10-CM | POA: Diagnosis not present

## 2021-11-07 DIAGNOSIS — F321 Major depressive disorder, single episode, moderate: Secondary | ICD-10-CM | POA: Diagnosis not present

## 2021-11-10 ENCOUNTER — Other Ambulatory Visit: Payer: Self-pay

## 2021-11-10 ENCOUNTER — Ambulatory Visit (INDEPENDENT_AMBULATORY_CARE_PROVIDER_SITE_OTHER): Payer: Medicare HMO | Admitting: Urology

## 2021-11-10 VITALS — BP 146/83 | HR 84

## 2021-11-10 DIAGNOSIS — R32 Unspecified urinary incontinence: Secondary | ICD-10-CM | POA: Diagnosis not present

## 2021-11-10 MED ORDER — CIPROFLOXACIN HCL 250 MG PO TABS: 250.0000 mg | ORAL_TABLET | Freq: Two times a day (BID) | ORAL | 0 refills | Status: AC

## 2021-11-10 MED ORDER — MIRABEGRON ER 50 MG PO TB24: 50.0000 mg | ORAL_TABLET | Freq: Every day | ORAL | 0 refills | Status: DC

## 2021-11-10 NOTE — Progress Notes (Signed)
11/10/2021 10:21 AM   Rachel Nielsen [birth year] [id number]  Referring provider: Jearld Fenton, NP [place],  [place] [address]  Chief Complaint  Patient presents with   Cysto    HPI: I was consulted to assess the patient for urinary incontinence.  She has urge incontinence especially when she goes from sitting to standing position.  She leaks with coughing sneezing and leaning forward but the urge component is more severe.  Sometimes she leaks without awareness.  She has high-volume bedwetting.  She wears 4 pads a day sometimes moderately wet sometimes soaks  She voids every 1-2 hours during the day and every 2 hours at night.  She can double void a modest amount a few times.  She had a bladder suspension years ago.  She is an insulin-dependent diabetic.  She has had a stroke.  She has had a hysterectomy.  She rarely was sneezing can lose bowel control but when she sits she has a bowel movement at times.   Uses a cane and walker for balance and for mild weakness on one side    Patient has mixed incontinence and likely a primarily an overactive bladder with bedwetting as well.  She has frequency and nocturia.  Because of her medical issues and less mobility I did not order urodynamics but mentioned I may need to test in the future.  I gave her Myrbetriq 25 mg samples and prescription and come back for pelvic examination cystoscopy in 5 to 6 weeks.  Call in 3 days of Diflucan for yeast in the urine.  Call if urine culture is positive.  Patient was grateful not to travel to [facility] for urodynamics.  Today Frequency stable.  Last culture negative Patient is 50% better on Myrbetriq 25 mg.  She said urine is foul-smelling and cloudy.  Last culture negative but culture last year positive Cystoscopy: Patient underwent flexible cystoscopy.  Bladder mucosa and trigone were normal.  Urine was a bit cloudy.  There was a lot of white flecks.  Urine sent for culture   PMH: Past Medical  History:  Diagnosis Date   Allergy    Anxiety    Asthma    Colon polyps    Constipation    COPD (chronic obstructive pulmonary disease) (HCC)    Depression    GERD (gastroesophageal reflux disease)    Glaucoma    Heart attack ([place])    Hypertension    Sleep apnea    Sleep apnea    Stroke [facility])    Urinary incontinence     Surgical History: Past Surgical History:  Procedure Laterality Date   APPENDECTOMY     CHOLECYSTECTOMY     COLONOSCOPY WITH PROPOFOL N/A 07/20/2018   Procedure: COLONOSCOPY WITH PROPOFOL;  Surgeon: Toledo, Benay Pike, MD;  Location: [facility] ENDOSCOPY;  Service: Gastroenterology;  Laterality: N/A;   ESOPHAGOGASTRODUODENOSCOPY N/A 07/20/2018   Procedure: ESOPHAGOGASTRODUODENOSCOPY (EGD);  Surgeon: Toledo, Benay Pike, MD;  Location: [facility] ENDOSCOPY;  Service: Gastroenterology;  Laterality: N/A;   GASTRECTOMY     TUBAL LIGATION     VAGINAL HYSTERECTOMY      Home Medications:  Allergies as of 11/10/2021       Reactions   Ace Inhibitors Cough        Medication List        Accurate as of November 10, 2021 10:21 AM. If you have any questions, ask your nurse or doctor.          Accu-Chek  Guide test strip Generic drug: glucose blood Use to check blood sugar up to twice a day as directed   Accu-Chek Softclix Lancets lancets Use to check blood sugar up to twice a day as directed   albuterol 108 (90 Base) MCG/ACT inhaler Commonly known as: VENTOLIN HFA Inhale 2-4 puffs by mouth every 4 hours as needed for wheezing, cough, and/or shortness of breath   albuterol (2.5 MG/3ML) 0.083% nebulizer solution Commonly known as: PROVENTIL Inhale into the lungs.   ALPRAZolam 1 MG tablet Commonly known as: XANAX Take 1 mg by mouth 3 (three) times daily as needed.   amLODipine 5 MG tablet Commonly known as: NORVASC Take 1 tablet (5 mg total) by mouth daily.   asenapine 5 MG Subl 24 hr tablet Commonly known as: SAPHRIS Place 5 mg under the tongue every evening.    bisoprolol 10 MG tablet Commonly known as: ZEBETA Take 1 tablet (10 mg total) by mouth daily. OFFICE VISIT NEEDED FOR ADDITIONAL REFILLS   buPROPion 300 MG 24 hr tablet Commonly known as: WELLBUTRIN XL Take 1 tablet (300 mg total) by mouth daily.   busPIRone 15 MG tablet Commonly known as: BUSPAR Take 15 mg by mouth 2 (two) times daily.   DULoxetine 60 MG capsule Commonly known as: CYMBALTA Take 1 capsule by mouth 2 (two) times daily.   Farxiga 10 MG Tabs tablet Generic drug: dapagliflozin propanediol Take 10 mg by mouth daily.   fluticasone 50 MCG/ACT nasal spray Commonly known as: FLONASE Place 1 spray into both nostrils daily.   fluticasone-salmeterol 250-50 MCG/ACT Aepb Commonly known as: ADVAIR Inhale 1 puff into the lungs 2 (two) times daily.   gabapentin 600 MG tablet Commonly known as: NEURONTIN Take 600-900 mg by mouth 3 (three) times daily. 600 mg AM, 600 mg pm and 900 mg at bedtime   Global Ease Inject Pen Needles 32G X 4 MM Misc Generic drug: Insulin Pen Needle Use as directed with insulin   BD Pen Needle Nano U/F 32G X 4 MM Misc Generic drug: Insulin Pen Needle Inject 1 Device into the skin daily.   insulin aspart 100 UNIT/ML injection Commonly known as: novoLOG Inject 12 Units into the skin 3 (three) times daily before meals.   insulin glargine 100 UNIT/ML injection Commonly known as: Lantus Inject 0.36 mLs (36 Units total) into the skin daily. What changed:  how much to take when to take this   INSULIN SYRINGE 1CC/31GX5/16" 31G X 5/16" 1 ML Misc 1 Syringe by Does not apply route in the morning, at noon, in the evening, and at bedtime.   ipratropium-albuterol 0.5-2.5 (3) MG/3ML Soln Commonly known as: DUONEB Take 3 mLs by nebulization every 4 (four) hours as needed.   Latuda 40 MG Tabs tablet Generic drug: lurasidone Take 40 mg by mouth at bedtime.   linaclotide 290 MCG Caps capsule Commonly known as: Linzess Take 1 capsule (290 mcg  total) by mouth daily before breakfast.   metFORMIN 500 MG tablet Commonly known as: GLUCOPHAGE Take 1 tablet by mouth 2 (two) times daily.   mirabegron ER 25 MG Tb24 tablet Commonly known as: MYRBETRIQ Take 1 tablet (25 mg total) by mouth daily.   montelukast 10 MG tablet Commonly known as: SINGULAIR Take 1 tablet (10 mg total) by mouth daily.   Olopatadine HCl 0.2 % Soln Place 1 drop into both eyes daily.   omeprazole 40 MG capsule Commonly known as: PRILOSEC Take 1 capsule (40 mg total) by mouth daily.  pregabalin 25 MG capsule Commonly known as: LYRICA   rosuvastatin 10 MG tablet Commonly known as: CRESTOR Take 1 tablet (10 mg total) by mouth daily.   Travatan Z 0.004 % Soln ophthalmic solution Generic drug: Travoprost (BAK Free) Place 1 drop into both eyes at bedtime.        Allergies:  Allergies  Allergen Reactions   Ace Inhibitors Cough    Family History: Family History  Problem Relation Age of Onset   Hypertension Mother    Atrial fibrillation Mother    Heart attack Mother    Hypertension Father    Stomach cancer Father    Diabetes Father    Liver cancer Father    Diabetes Sister    Hypertension Sister     Social History:  reports that she has quit smoking. She has never used smokeless tobacco. She reports that she does not currently use alcohol. She reports that she does not use drugs.  ROS:                                        Physical Exam: There were no vitals taken for this visit.  Constitutional:  Alert and oriented, No acute distress. HEENT: [place] AT, moist mucus membranes.  Trachea midline, no masses.   Laboratory Data: Lab Results  Component Value Date   WBC 14.9 (H) 09/15/2021   HGB 14.2 09/15/2021   HCT 47.0 (H) 09/15/2021   MCV 84.8 09/15/2021   PLT 209 09/15/2021    Lab Results  Component Value Date   CREATININE 0.97 09/15/2021    No results found for: PSA  No results found for:  TESTOSTERONE  Lab Results  Component Value Date   HGBA1C 9.2 (H) 09/15/2021    Urinalysis    Component Value Date/Time   COLORURINE YELLOW (A) 09/21/2020 1841   APPEARANCEUR Cloudy (A) 07/28/2021 1427   LABSPEC 1.015 09/21/2020 1841   PHURINE 5.0 09/21/2020 1841   GLUCOSEU 2+ (A) 07/28/2021 1427   HGBUR SMALL (A) 09/21/2020 1841   BILIRUBINUR Negative 07/28/2021 [place] 09/21/2020 1841   PROTEINUR Negative 07/28/2021 1427   PROTEINUR NEGATIVE 09/21/2020 1841   NITRITE Negative 07/28/2021 1427   NITRITE POSITIVE (A) 09/21/2020 1841   LEUKOCYTESUR Trace (A) 07/28/2021 1427   LEUKOCYTESUR TRACE (A) 09/21/2020 1841    Pertinent Imaging:   Assessment & Plan: I am suspect patient may have a bladder infection.  I will call in ciprofloxacin 250 mg twice a day for 7 days.  I will increase the dose of Myrbetriq to 50 mg.  1 or both may improve continence before we switch medication.  Reassess in 6 weeks  1. Urinary incontinence, unspecified type  - Urinalysis, Complete   No follow-ups on file.  Reece Packer, MD  [place] [address] [place], [place] [address] [phone]

## 2021-11-10 NOTE — Addendum Note (Signed)
Addended by: Alvera Novel on: 11/10/2021 10:58 AM   Modules accepted: Orders

## 2021-11-12 LAB — URINALYSIS, COMPLETE
Bilirubin, UA: NEGATIVE
Ketones, UA: NEGATIVE
Leukocytes,UA: NEGATIVE
Nitrite, UA: POSITIVE — AB
Protein,UA: NEGATIVE
Specific Gravity, UA: 1.01 (ref 1.005–1.030)
Urobilinogen, Ur: 0.2 mg/dL (ref 0.2–1.0)
pH, UA: 6 (ref 5.0–7.5)

## 2021-11-12 LAB — MICROSCOPIC EXAMINATION

## 2021-11-14 LAB — CULTURE, URINE COMPREHENSIVE

## 2021-11-21 DIAGNOSIS — F321 Major depressive disorder, single episode, moderate: Secondary | ICD-10-CM | POA: Diagnosis not present

## 2021-11-21 DIAGNOSIS — F419 Anxiety disorder, unspecified: Secondary | ICD-10-CM | POA: Diagnosis not present

## 2021-12-01 DIAGNOSIS — F4310 Post-traumatic stress disorder, unspecified: Secondary | ICD-10-CM | POA: Diagnosis not present

## 2021-12-01 DIAGNOSIS — F3189 Other bipolar disorder: Secondary | ICD-10-CM | POA: Diagnosis not present

## 2021-12-02 DIAGNOSIS — F321 Major depressive disorder, single episode, moderate: Secondary | ICD-10-CM | POA: Diagnosis not present

## 2021-12-02 DIAGNOSIS — F419 Anxiety disorder, unspecified: Secondary | ICD-10-CM | POA: Diagnosis not present

## 2021-12-09 DIAGNOSIS — I10 Essential (primary) hypertension: Secondary | ICD-10-CM | POA: Diagnosis not present

## 2021-12-09 DIAGNOSIS — R002 Palpitations: Secondary | ICD-10-CM | POA: Diagnosis not present

## 2021-12-09 DIAGNOSIS — Z23 Encounter for immunization: Secondary | ICD-10-CM | POA: Diagnosis not present

## 2021-12-09 DIAGNOSIS — R0600 Dyspnea, unspecified: Secondary | ICD-10-CM | POA: Diagnosis not present

## 2021-12-09 DIAGNOSIS — Z794 Long term (current) use of insulin: Secondary | ICD-10-CM | POA: Diagnosis not present

## 2021-12-09 DIAGNOSIS — E119 Type 2 diabetes mellitus without complications: Secondary | ICD-10-CM | POA: Diagnosis not present

## 2021-12-09 DIAGNOSIS — Z8673 Personal history of transient ischemic attack (TIA), and cerebral infarction without residual deficits: Secondary | ICD-10-CM | POA: Diagnosis not present

## 2021-12-09 DIAGNOSIS — J449 Chronic obstructive pulmonary disease, unspecified: Secondary | ICD-10-CM | POA: Diagnosis not present

## 2021-12-18 ENCOUNTER — Other Ambulatory Visit: Payer: Self-pay

## 2021-12-18 ENCOUNTER — Ambulatory Visit (INDEPENDENT_AMBULATORY_CARE_PROVIDER_SITE_OTHER): Payer: Medicare HMO

## 2021-12-18 DIAGNOSIS — Z0000 Encounter for general adult medical examination without abnormal findings: Secondary | ICD-10-CM

## 2021-12-18 NOTE — Progress Notes (Signed)
Virtual Visit via Telephone Note  I connected with [name] on 12/18/21 at  2:20 PM EST by telephone and verified that I am speaking with the correct person using two identifiers.  Location: Patient:  Rachel Nielsen Provider: Webb Silversmith, NP   I discussed the limitations, risks, security and privacy concerns of performing an evaluation and management service by telephone and the availability of in person appointments. I also discussed with the patient that there may be a patient responsible charge related to this service. The patient expressed understanding and agreed to proceed.  I provided 40 minutes of non-face-to-face time during this encounter.   Wilson Singer, CMA    HPI:  Patient presents to clinic today for their subsequent annual Medicare wellness exam.  Past Medical History:  Diagnosis Date   Allergy    Anxiety    Asthma    Colon polyps    Constipation    COPD (chronic obstructive pulmonary disease) ([place])    Depression    GERD (gastroesophageal reflux disease)    Glaucoma    Heart attack ([place])    Hypertension    Sleep apnea    Sleep apnea    Stroke [facility])    Urinary incontinence     Current Outpatient Medications  Medication Sig Dispense Refill   ACCU-CHEK GUIDE test strip Use to check blood sugar up to twice a day as directed 100 strip 3   Accu-Chek Softclix Lancets lancets Use to check blood sugar up to twice a day as directed 100 each 3   albuterol (PROVENTIL) (2.5 MG/3ML) 0.083% nebulizer solution Inhale into the lungs.     albuterol (VENTOLIN HFA) 108 (90 Base) MCG/ACT inhaler Inhale 2-4 puffs by mouth every 4 hours as needed for wheezing, cough, and/or shortness of breath 6.7 g 0   ALPRAZolam (XANAX) 1 MG tablet Take 1 mg by mouth 3 (three) times daily as needed.     amLODipine (NORVASC) 5 MG tablet Take 1 tablet (5 mg total) by mouth daily. 90 tablet 1   asenapine (SAPHRIS) 5 MG SUBL 24 hr tablet Place 5 mg under the tongue every evening.      bisoprolol (ZEBETA) 10 MG tablet Take 1 tablet (10 mg total) by mouth daily. OFFICE VISIT NEEDED FOR ADDITIONAL REFILLS 90 tablet 1   buPROPion (WELLBUTRIN XL) 300 MG 24 hr tablet Take 1 tablet (300 mg total) by mouth daily. 30 tablet 0   busPIRone (BUSPAR) 15 MG tablet Take 15 mg by mouth 2 (two) times daily.      DULoxetine (CYMBALTA) 60 MG capsule Take 1 capsule by mouth 2 (two) times daily.     FARXIGA 10 MG TABS tablet Take 10 mg by mouth daily.     fluticasone (FLONASE) 50 MCG/ACT nasal spray Place 1 spray into both nostrils daily. 16 g 2   fluticasone-salmeterol (ADVAIR) 250-50 MCG/ACT AEPB Inhale 1 puff into the lungs 2 (two) times daily.     gabapentin (NEURONTIN) 600 MG tablet Take 600-900 mg by mouth 3 (three) times daily. 600 mg AM, 600 mg pm and 900 mg at bedtime     GLOBAL EASE INJECT PEN NEEDLES 32G X 4 MM MISC Use as directed with insulin 100 each 0   insulin aspart (NOVOLOG) 100 UNIT/ML injection Inject 12 Units into the skin 3 (three) times daily before meals.     insulin glargine (LANTUS) 100 UNIT/ML injection Inject 0.36 mLs (36 Units total) into the skin daily. (Patient taking differently: Inject  46 Units into the skin at bedtime.) 10 mL 2   Insulin Pen Needle (BD PEN NEEDLE NANO U/F) 32G X 4 MM MISC Inject 1 Device into the skin daily. 100 each 3   Insulin Syringe-Needle U-100 (INSULIN SYRINGE 1CC/31GX5/16") 31G X 5/16" 1 ML MISC 1 Syringe by Does not apply route in the morning, at noon, in the evening, and at bedtime. 200 each 3   LATUDA 40 MG TABS tablet Take 40 mg by mouth at bedtime.     linaclotide (LINZESS) 290 MCG CAPS capsule Take 1 capsule (290 mcg total) by mouth daily before breakfast. 30 capsule 5   metFORMIN (GLUCOPHAGE) 500 MG tablet Take 1 tablet by mouth 2 (two) times daily.     mirabegron ER (MYRBETRIQ) 50 MG TB24 tablet Take 1 tablet (50 mg total) by mouth daily. 30 tablet 0   montelukast (SINGULAIR) 10 MG tablet Take 1 tablet (10 mg total) by mouth daily. 30  tablet 0   Olopatadine HCl 0.2 % SOLN Place 1 drop into both eyes daily. 2.5 mL 0   omeprazole (PRILOSEC) 40 MG capsule Take 1 capsule (40 mg total) by mouth daily. 90 capsule 0   pregabalin (LYRICA) 25 MG capsule      rosuvastatin (CRESTOR) 10 MG tablet Take 1 tablet (10 mg total) by mouth daily. 90 tablet 0   traMADol (ULTRAM) 50 MG tablet Take 1 tablet by mouth 2 (two) times daily.     TRAVATAN Z 0.004 % SOLN ophthalmic solution Place 1 drop into both eyes at bedtime. 5 mL 0   TRULICITY 1.5 FK/8.1EX SOPN Inject into the skin.     ipratropium-albuterol (DUONEB) 0.5-2.5 (3) MG/3ML SOLN Take 3 mLs by nebulization every 4 (four) hours as needed. 360 mL 0   No current facility-administered medications for this visit.    Allergies  Allergen Reactions   Ace Inhibitors Cough    Family History  Problem Relation Age of Onset   Hypertension Mother    Atrial fibrillation Mother    Heart attack Mother    Hypertension Father    Stomach cancer Father    Diabetes Father    Liver cancer Father    Diabetes Sister    Hypertension Sister     Social History   Socioeconomic History   Marital status: Legally Separated    Spouse name: Not on file   Number of children: 4   Years of education: Not on file   Highest education level: Not on file  Occupational History   Occupation: retired  Tobacco Use   Smoking status: Former   Smokeless tobacco: Never   Tobacco comments:    4 months ago  Scientific laboratory technician Use: Some days   Substances: Nicotine, Flavoring  Substance and Sexual Activity   Alcohol use: Not Currently    Comment: monthly or less    Drug use: No   Sexual activity: Not on file  Other Topics Concern   Not on file  Social History Narrative   Not on file   Social Determinants of Health   Financial Resource Strain: Low Risk    Difficulty of Paying Living Expenses: Not hard at all  Food Insecurity: No Food Insecurity   Worried About Charity fundraiser in the Last Year:  Never true   [place] in the Last Year: Never true  Transportation Needs: No Transportation Needs   Lack of Transportation (Medical): No   Lack of Transportation (Non-Medical):  No  Physical Activity: Inactive   Days of Exercise per Week: 0 days   Minutes of Exercise per Session: 0 min  Stress: No Stress Concern Present   Feeling of Stress : Not at all  Social Connections: Socially Isolated   Frequency of Communication with Friends and Family: More than three times a week   Frequency of Social Gatherings with Friends and Family: More than three times a week   Attends Religious Services: Never   Marine scientist or Organizations: No   Attends Archivist Meetings: Never   Marital Status: Separated  Intimate Partner Violence: Not on file    Hospitiliaztions: No recent hospitalization in the past 12 mths   Health Maintenance:  Tetanus: declined  Zoster/ Shingrix: declined  Influenza : 10/21/20 Prevnar: 07/09/2021  Foot Exam: 12/17/2020 Ophthalmology: 04/04/2021 Mammogram: 04/16/2020 Colonoscopy: 07/20/2018     Providers: PCP: Webb Silversmith, NP Endocrinology: Germain Osgood, MD Urologist: Bjorn Loser, MD Cardiologist: Isaias Cowman, MD Pulmonologist: Wallene Huh. MD Neurology: Gurney Maxin, MD Psychiatry :Myer Haff    I have personally reviewed and have noted:  1. The patient's medical and social history 2. Their use of alcohol, tobacco or illicit drugs 3. Their current medications and supplements 4. The patient's functional ability including ADL's, fall risks, home safety risks and hearing or visual impairment. 5. Diet and physical activities 6. Evidence for depression or mood disorder  Subjective:   Review of Systems:   Constitutional: Denies fever, malaise, fatigue, headache or abrupt weight changes.  HEENT: Denies eye pain, eye redness, ear pain, ringing in the ears, wax buildup, runny nose, nasal congestion, bloody  nose, or sore throat. Respiratory: Denies difficulty breathing, shortness of breath, cough or sputum production.   Cardiovascular: Denies chest pain, chest tightness, palpitations or swelling in the hands or feet.  Gastrointestinal: Denies abdominal pain, bloating, constipation, diarrhea or blood in the stool.  GU: Urgency, frequency. Denies pain with urination, burning sensation, blood in urine, odor or discharge. Musculoskeletal: Difficulty with balance and gait post stroke. Denies decrease in range of motion, joint pain and swelling.  Skin: Denies redness, rashes, lesions or ulcercations.  Neurological: Denies dizziness, difficulty with memory Psych: Anxiety, depression, SI/HI.  No other specific complaints in a complete review of systems (except as listed in HPI above).  Objective:  PE:   There were no vitals taken for this visit. Wt Readings from Last 3 Encounters:  09/15/21 217 lb (98.4 kg)  07/09/21 223 lb 6.4 oz (101.3 kg)  12/31/20 223 lb (101.2 kg)     BMET    Component Value Date/Time   NA 138 09/15/2021 0337   NA 141 10/19/2019 1323   K 4.4 09/15/2021 0337   CL 104 09/15/2021 0337   CO2 28 09/15/2021 0337   GLUCOSE 225 (H) 09/15/2021 0337   BUN 22 09/15/2021 0337   BUN 19 10/19/2019 1323   CREATININE 0.97 09/15/2021 0337   CALCIUM 9.4 09/15/2021 0337   GFRNONAA >60 09/15/2021 0337   GFRAA 59 (L) 08/03/2020 0637    Lipid Panel  No results found for: CHOL, TRIG, HDL, CHOLHDL, VLDL, LDLCALC  CBC    Component Value Date/Time   WBC 14.9 (H) 09/15/2021 0337   RBC 5.54 (H) 09/15/2021 0337   HGB 14.2 09/15/2021 0337   HCT 47.0 (H) 09/15/2021 0337   PLT 209 09/15/2021 0337   MCV 84.8 09/15/2021 0337   MCH 25.6 (L) 09/15/2021 0337   MCHC 30.2 09/15/2021 8563  RDW 15.2 09/15/2021 0337   LYMPHSABS 3.5 09/15/2021 0337   MONOABS 0.8 09/15/2021 0337   EOSABS 1.3 (H) 09/15/2021 0337   BASOSABS 0.1 09/15/2021 0337    Hgb A1C Lab Results  Component Value  Date   HGBA1C 9.2 (H) 09/15/2021      Assessment and Plan:   Medicare Annual Wellness Visit:  Diet: DM if diabetic Physical activity: Sedentary Depression/mood screen:  Positive, followed by Myer Haff MD [place] from 12/18/2021 in [place]  PHQ-9 Total Score 22      Hearing: Intact to whispered voice Visual acuity: Grossly normal, performs annual eye exam  ADLs: Capable Fall risk: None Home safety: Good Cognitive evaluation:  6CIT Screen 12/18/2021 12/31/2020  What Year? 0 points 0 points  What month? 0 points 0 points  What time? 0 points 0 points  Count back from 20 0 points 0 points  Months in reverse 0 points 0 points  Repeat phrase 0 points 4 points  Total Score 0 4     EOL planning: Adv directives, full code  Next appointment: Annual Physical 01/06/2022  Wilson Singer, [place]

## 2021-12-19 DIAGNOSIS — E1165 Type 2 diabetes mellitus with hyperglycemia: Secondary | ICD-10-CM | POA: Diagnosis not present

## 2021-12-21 ENCOUNTER — Emergency Department: Payer: Medicare HMO

## 2021-12-21 ENCOUNTER — Other Ambulatory Visit: Payer: Self-pay

## 2021-12-21 ENCOUNTER — Inpatient Hospital Stay
Admission: EM | Admit: 2021-12-21 | Discharge: 2021-12-23 | DRG: 189 | Disposition: A | Discharge status: Home / Self Care | Payer: Medicare HMO | Attending: Internal Medicine | Admitting: Internal Medicine

## 2021-12-21 DIAGNOSIS — G4733 Obstructive sleep apnea (adult) (pediatric): Secondary | ICD-10-CM | POA: Diagnosis present

## 2021-12-21 DIAGNOSIS — Z6838 Body mass index (BMI) 38.0-38.9, adult: Secondary | ICD-10-CM

## 2021-12-21 DIAGNOSIS — F419 Anxiety disorder, unspecified: Secondary | ICD-10-CM | POA: Diagnosis present

## 2021-12-21 DIAGNOSIS — E1140 Type 2 diabetes mellitus with diabetic neuropathy, unspecified: Secondary | ICD-10-CM | POA: Diagnosis present

## 2021-12-21 DIAGNOSIS — I10 Essential (primary) hypertension: Secondary | ICD-10-CM | POA: Diagnosis present

## 2021-12-21 DIAGNOSIS — E1165 Type 2 diabetes mellitus with hyperglycemia: Secondary | ICD-10-CM | POA: Diagnosis present

## 2021-12-21 DIAGNOSIS — D72829 Elevated white blood cell count, unspecified: Secondary | ICD-10-CM | POA: Diagnosis present

## 2021-12-21 DIAGNOSIS — J441 Chronic obstructive pulmonary disease with (acute) exacerbation: Secondary | ICD-10-CM | POA: Diagnosis present

## 2021-12-21 DIAGNOSIS — J80 Acute respiratory distress syndrome: Secondary | ICD-10-CM | POA: Diagnosis not present

## 2021-12-21 DIAGNOSIS — Z8673 Personal history of transient ischemic attack (TIA), and cerebral infarction without residual deficits: Secondary | ICD-10-CM

## 2021-12-21 DIAGNOSIS — Z79899 Other long term (current) drug therapy: Secondary | ICD-10-CM

## 2021-12-21 DIAGNOSIS — K5900 Constipation, unspecified: Secondary | ICD-10-CM | POA: Diagnosis present

## 2021-12-21 DIAGNOSIS — J9601 Acute respiratory failure with hypoxia: Secondary | ICD-10-CM | POA: Diagnosis present

## 2021-12-21 DIAGNOSIS — K219 Gastro-esophageal reflux disease without esophagitis: Secondary | ICD-10-CM | POA: Diagnosis present

## 2021-12-21 DIAGNOSIS — Z800 Family history of malignant neoplasm of digestive organs: Secondary | ICD-10-CM | POA: Diagnosis not present

## 2021-12-21 DIAGNOSIS — R0602 Shortness of breath: Secondary | ICD-10-CM | POA: Diagnosis not present

## 2021-12-21 DIAGNOSIS — J45901 Unspecified asthma with (acute) exacerbation: Secondary | ICD-10-CM | POA: Diagnosis present

## 2021-12-21 DIAGNOSIS — Z8249 Family history of ischemic heart disease and other diseases of the circulatory system: Secondary | ICD-10-CM

## 2021-12-21 DIAGNOSIS — J9602 Acute respiratory failure with hypercapnia: Secondary | ICD-10-CM | POA: Diagnosis present

## 2021-12-21 DIAGNOSIS — E669 Obesity, unspecified: Secondary | ICD-10-CM | POA: Diagnosis present

## 2021-12-21 DIAGNOSIS — Z833 Family history of diabetes mellitus: Secondary | ICD-10-CM | POA: Diagnosis not present

## 2021-12-21 DIAGNOSIS — Z888 Allergy status to other drugs, medicaments and biological substances status: Secondary | ICD-10-CM

## 2021-12-21 DIAGNOSIS — F17290 Nicotine dependence, other tobacco product, uncomplicated: Secondary | ICD-10-CM | POA: Diagnosis present

## 2021-12-21 DIAGNOSIS — Z7951 Long term (current) use of inhaled steroids: Secondary | ICD-10-CM

## 2021-12-21 DIAGNOSIS — R069 Unspecified abnormalities of breathing: Secondary | ICD-10-CM | POA: Diagnosis not present

## 2021-12-21 DIAGNOSIS — E872 Acidosis, unspecified: Secondary | ICD-10-CM | POA: Diagnosis present

## 2021-12-21 DIAGNOSIS — F329 Major depressive disorder, single episode, unspecified: Secondary | ICD-10-CM | POA: Diagnosis present

## 2021-12-21 DIAGNOSIS — Z794 Long term (current) use of insulin: Secondary | ICD-10-CM

## 2021-12-21 DIAGNOSIS — E785 Hyperlipidemia, unspecified: Secondary | ICD-10-CM | POA: Diagnosis present

## 2021-12-21 DIAGNOSIS — Z20822 Contact with and (suspected) exposure to covid-19: Secondary | ICD-10-CM | POA: Diagnosis present

## 2021-12-21 DIAGNOSIS — R6510 Systemic inflammatory response syndrome (SIRS) of non-infectious origin without acute organ dysfunction: Secondary | ICD-10-CM | POA: Diagnosis present

## 2021-12-21 DIAGNOSIS — R739 Hyperglycemia, unspecified: Secondary | ICD-10-CM | POA: Diagnosis not present

## 2021-12-21 DIAGNOSIS — R0689 Other abnormalities of breathing: Secondary | ICD-10-CM | POA: Diagnosis not present

## 2021-12-21 DIAGNOSIS — J449 Chronic obstructive pulmonary disease, unspecified: Secondary | ICD-10-CM | POA: Diagnosis present

## 2021-12-21 DIAGNOSIS — Z7984 Long term (current) use of oral hypoglycemic drugs: Secondary | ICD-10-CM

## 2021-12-21 DIAGNOSIS — R000 Tachycardia, unspecified: Secondary | ICD-10-CM | POA: Diagnosis not present

## 2021-12-21 LAB — CBC WITH DIFFERENTIAL/PLATELET
Abs Immature Granulocytes: 0.06 10*3/uL (ref 0.00–0.07)
Basophils Absolute: 0.1 10*3/uL (ref 0.0–0.1)
Basophils Relative: 1 %
Eosinophils Absolute: 0.8 10*3/uL — ABNORMAL HIGH (ref 0.0–0.5)
Eosinophils Relative: 6 %
HCT: 43.9 % (ref 36.0–46.0)
Hemoglobin: 13.1 g/dL (ref 12.0–15.0)
Immature Granulocytes: 1 %
Lymphocytes Relative: 21 %
Lymphs Abs: 2.6 10*3/uL (ref 0.7–4.0)
MCH: 25.3 pg — ABNORMAL LOW (ref 26.0–34.0)
MCHC: 29.8 g/dL — ABNORMAL LOW (ref 30.0–36.0)
MCV: 84.7 fL (ref 80.0–100.0)
Monocytes Absolute: 0.9 10*3/uL (ref 0.1–1.0)
Monocytes Relative: 7 %
Neutro Abs: 8 10*3/uL — ABNORMAL HIGH (ref 1.7–7.7)
Neutrophils Relative %: 64 %
Platelets: 234 10*3/uL (ref 150–400)
RBC: 5.18 MIL/uL — ABNORMAL HIGH (ref 3.87–5.11)
RDW: 15.9 % — ABNORMAL HIGH (ref 11.5–15.5)
WBC: 12.4 10*3/uL — ABNORMAL HIGH (ref 4.0–10.5)
nRBC: 0 % (ref 0.0–0.2)

## 2021-12-21 LAB — COMPREHENSIVE METABOLIC PANEL
ALT: 23 U/L (ref 0–44)
AST: 26 U/L (ref 15–41)
Albumin: 3.8 g/dL (ref 3.5–5.0)
Alkaline Phosphatase: 96 U/L (ref 38–126)
Anion gap: 11 (ref 5–15)
BUN: 17 mg/dL (ref 8–23)
CO2: 30 mmol/L (ref 22–32)
Calcium: 9.2 mg/dL (ref 8.9–10.3)
Chloride: 98 mmol/L (ref 98–111)
Creatinine, Ser: 0.96 mg/dL (ref 0.44–1.00)
GFR, Estimated: >60 mL/min (ref >60)
Glucose, Bld: 211 mg/dL — ABNORMAL HIGH (ref 70–99)
Potassium: 4.2 mmol/L (ref 3.5–5.1)
Sodium: 139 mmol/L (ref 135–145)
Total Bilirubin: 0.6 mg/dL (ref 0.3–1.2)
Total Protein: 7.5 g/dL (ref 6.5–8.1)

## 2021-12-21 LAB — BLOOD GAS, VENOUS
Acid-Base Excess: 5.3 mmol/L — ABNORMAL HIGH (ref 0.0–2.0)
Bicarbonate: 33.2 mmol/L — ABNORMAL HIGH (ref 20.0–28.0)
O2 Saturation: 86.7 %
Patient temperature: 37
pCO2, Ven: 63 mmHg — ABNORMAL HIGH (ref 44–60)
pH, Ven: 7.33 (ref 7.25–7.43)
pO2, Ven: 58 mmHg — ABNORMAL HIGH (ref 32–45)

## 2021-12-21 LAB — APTT: aPTT: 23 seconds — ABNORMAL LOW (ref 24–36)

## 2021-12-21 LAB — PROCALCITONIN: Procalcitonin: <0.1 ng/mL

## 2021-12-21 LAB — TROPONIN I (HIGH SENSITIVITY)
Troponin I (High Sensitivity): 11 ng/L (ref <18)
Troponin I (High Sensitivity): 9 ng/L (ref <18)

## 2021-12-21 LAB — URINALYSIS, COMPLETE (UACMP) WITH MICROSCOPIC
Bacteria, UA: NONE SEEN
Bilirubin Urine: NEGATIVE
Glucose, UA: >=500 mg/dL — AB
Hgb urine dipstick: NEGATIVE
Ketones, ur: 5 mg/dL — AB
Nitrite: NEGATIVE
Protein, ur: NEGATIVE mg/dL
Specific Gravity, Urine: 1.027 (ref 1.005–1.030)
pH: 5 (ref 5.0–8.0)

## 2021-12-21 LAB — RESP PANEL BY RT-PCR (FLU A&B, COVID) ARPGX2
Influenza A by PCR: NEGATIVE
Influenza B by PCR: NEGATIVE
SARS Coronavirus 2 by RT PCR: NEGATIVE

## 2021-12-21 LAB — PROTIME-INR
INR: 1.1 (ref 0.8–1.2)
Prothrombin Time: 14.3 seconds (ref 11.4–15.2)

## 2021-12-21 LAB — GLUCOSE, CAPILLARY: Glucose-Capillary: 284 mg/dL — ABNORMAL HIGH (ref 70–99)

## 2021-12-21 LAB — MAGNESIUM: Magnesium: 2 mg/dL (ref 1.7–2.4)

## 2021-12-21 LAB — LACTIC ACID, PLASMA
Lactic Acid, Venous: 2.7 mmol/L (ref 0.5–1.9)
Lactic Acid, Venous: 2.8 mmol/L (ref 0.5–1.9)

## 2021-12-21 LAB — HEMOGLOBIN A1C
Hgb A1c MFr Bld: 9 % — ABNORMAL HIGH (ref 4.8–5.6)
Mean Plasma Glucose: 211.6 mg/dL

## 2021-12-21 MED ORDER — INSULIN ASPART 100 UNIT/ML IJ SOLN
0.0000 [IU] | Freq: Three times a day (TID) | INTRAMUSCULAR | Status: DC
  Administered 2021-12-21: 5 [IU] via SUBCUTANEOUS
  Administered 2021-12-22: 3 [IU] via SUBCUTANEOUS
  Administered 2021-12-22: 5 [IU] via SUBCUTANEOUS
  Administered 2021-12-22: 3 [IU] via SUBCUTANEOUS
  Administered 2021-12-23: 1 [IU] via SUBCUTANEOUS
  Filled 2021-12-21 (×4): qty 1

## 2021-12-21 MED ORDER — BISOPROLOL FUMARATE 5 MG PO TABS
10.0000 mg | ORAL_TABLET | Freq: Every day | ORAL | Status: DC
  Administered 2021-12-22 – 2021-12-23 (×2): 10 mg via ORAL
  Filled 2021-12-21 (×4): qty 2

## 2021-12-21 MED ORDER — DULOXETINE HCL 30 MG PO CPEP
60.0000 mg | ORAL_CAPSULE | Freq: Two times a day (BID) | ORAL | Status: DC
Start: 2021-12-21 — End: 2021-12-23
  Administered 2021-12-21 – 2021-12-23 (×5): 60 mg via ORAL
  Filled 2021-12-21 (×5): qty 2

## 2021-12-21 MED ORDER — IPRATROPIUM-ALBUTEROL 0.5-2.5 (3) MG/3ML IN SOLN
9.0000 mL | Freq: Once | RESPIRATORY_TRACT | Status: AC
  Administered 2021-12-21: 9 mL via RESPIRATORY_TRACT
  Filled 2021-12-21: qty 9

## 2021-12-21 MED ORDER — AMLODIPINE BESYLATE 5 MG PO TABS
5.0000 mg | ORAL_TABLET | Freq: Every day | ORAL | Status: DC
  Administered 2021-12-21 – 2021-12-23 (×3): 5 mg via ORAL
  Filled 2021-12-21 (×3): qty 1

## 2021-12-21 MED ORDER — ENOXAPARIN SODIUM 40 MG/0.4ML IJ SOSY: 40.0000 mg | PREFILLED_SYRINGE | INTRAMUSCULAR | Status: DC

## 2021-12-21 MED ORDER — BUPROPION HCL ER (XL) 150 MG PO TB24
300.0000 mg | ORAL_TABLET | Freq: Every day | ORAL | Status: DC
  Administered 2021-12-21 – 2021-12-23 (×3): 300 mg via ORAL
  Filled 2021-12-21 (×3): qty 2

## 2021-12-21 MED ORDER — POLYETHYLENE GLYCOL 3350 17 G PO PACK
17.0000 g | PACK | Freq: Every day | ORAL | Status: DC
  Administered 2021-12-21 – 2021-12-23 (×3): 17 g via ORAL
  Filled 2021-12-21 (×3): qty 1

## 2021-12-21 MED ORDER — BISACODYL 5 MG PO TBEC: 5.0000 mg | DELAYED_RELEASE_TABLET | Freq: Every day | ORAL | Status: DC | PRN

## 2021-12-21 MED ORDER — ALPRAZOLAM 0.5 MG PO TABS
1.0000 mg | ORAL_TABLET | Freq: Three times a day (TID) | ORAL | Status: DC | PRN
  Administered 2021-12-21 – 2021-12-22 (×2): 1 mg via ORAL
  Filled 2021-12-21 (×2): qty 2

## 2021-12-21 MED ORDER — BUSPIRONE HCL 10 MG PO TABS
15.0000 mg | ORAL_TABLET | Freq: Two times a day (BID) | ORAL | Status: DC
  Administered 2021-12-21 – 2021-12-22 (×4): 15 mg via ORAL
  Filled 2021-12-21 (×5): qty 2

## 2021-12-21 MED ORDER — ACETAMINOPHEN 500 MG PO TABS: 1000.0000 mg | ORAL_TABLET | Freq: Once | ORAL | Status: DC

## 2021-12-21 MED ORDER — ROSUVASTATIN CALCIUM 10 MG PO TABS
10.0000 mg | ORAL_TABLET | Freq: Every day | ORAL | Status: DC
Start: 2021-12-21 — End: 2021-12-23
  Administered 2021-12-21 – 2021-12-22 (×2): 10 mg via ORAL
  Filled 2021-12-21 (×3): qty 1

## 2021-12-21 MED ORDER — ACETAMINOPHEN 325 MG PO TABS
650.0000 mg | ORAL_TABLET | Freq: Four times a day (QID) | ORAL | Status: DC | PRN
  Administered 2021-12-21: 650 mg via ORAL
  Filled 2021-12-21: qty 2

## 2021-12-21 MED ORDER — ENOXAPARIN SODIUM 60 MG/0.6ML IJ SOSY
0.5000 mg/kg | PREFILLED_SYRINGE | INTRAMUSCULAR | Status: DC
  Administered 2021-12-21 – 2021-12-22 (×2): 50 mg via SUBCUTANEOUS
  Filled 2021-12-21 (×2): qty 0.6

## 2021-12-21 MED ORDER — ONDANSETRON HCL 4 MG/2ML IJ SOLN: 4.0000 mg | Freq: Once | INTRAMUSCULAR | Status: DC | PRN

## 2021-12-21 MED ORDER — LACTATED RINGERS IV BOLUS
1000.0000 mL | Freq: Once | INTRAVENOUS | Status: AC
  Administered 2021-12-21: 1000 mL via INTRAVENOUS

## 2021-12-21 MED ORDER — SODIUM CHLORIDE 0.9 % IV SOLN: INTRAVENOUS | Status: DC

## 2021-12-21 MED ORDER — INSULIN ASPART 100 UNIT/ML IJ SOLN: 0.0000 [IU] | Freq: Three times a day (TID) | INTRAMUSCULAR | Status: DC

## 2021-12-21 MED ORDER — PREDNISONE 50 MG PO TABS
60.0000 mg | ORAL_TABLET | Freq: Every day | ORAL | Status: DC
  Administered 2021-12-22 – 2021-12-23 (×2): 60 mg via ORAL
  Filled 2021-12-21 (×2): qty 1

## 2021-12-21 MED ORDER — LURASIDONE HCL 40 MG PO TABS
40.0000 mg | ORAL_TABLET | Freq: Every day | ORAL | Status: DC
Start: 2021-12-21 — End: 2021-12-23
  Administered 2021-12-21 – 2021-12-22 (×2): 40 mg via ORAL
  Filled 2021-12-21 (×3): qty 1

## 2021-12-21 MED ORDER — IPRATROPIUM-ALBUTEROL 0.5-2.5 (3) MG/3ML IN SOLN: 3.0000 mL | RESPIRATORY_TRACT | Status: DC | PRN

## 2021-12-21 MED ORDER — DAPAGLIFLOZIN PROPANEDIOL 10 MG PO TABS
10.0000 mg | ORAL_TABLET | Freq: Every day | ORAL | Status: DC
  Administered 2021-12-22 – 2021-12-23 (×2): 10 mg via ORAL
  Filled 2021-12-21 (×4): qty 1

## 2021-12-21 MED ORDER — ACETAMINOPHEN 650 MG RE SUPP: 650.0000 mg | Freq: Four times a day (QID) | RECTAL | Status: DC | PRN

## 2021-12-21 NOTE — ED Provider Notes (Signed)
[facility] Provider Note    Event Date/Time   First MD Initiated Contact with Patient 12/21/21 [phone]     (approximate)   History   Shortness of Breath   HPI  Rachel Nielsen is a 71 y.o. female with a past medical history of COPD and asthma, arthritis, HTN, DM, and anxiety presents via EMS from home for evaluation of worsening shortness of breath and cough over the last 2 to 3 days.  Patient states he had a little diarrhea yesterday.  She states she had little pain in her lower neck area yesterday but this resolved after she placed a heating pad on it.  She denies any chest pain, abdominal pain, hemoptysis, fevers, earache, sore throat, diarrhea, urinary symptoms or extremity pain.  She states he has a little bit of a headache today.  No other acute concerns at this time.  She states she has been using her inhalers at home but does not really help much.  Per EMS she received a DuoNeb and Solu-Medrol and was transported on CPAP.  Patient denies tobacco abuse but states he vapes.  She denies any illicit drug use.      Physical Exam  Triage Vital Signs: ED Triage Vitals  Enc Vitals Group     BP      Pulse      Resp      Temp      Temp src      SpO2      Weight      Height      Head Circumference      Peak Flow      Pain Score      Pain Loc      Pain Edu?      Excl. in [place]?     Most recent vital signs: Vitals:   12/21/21 0830 12/21/21 0918  BP: (!) 151/94   Pulse: (!) 106 (!) 105  Resp: (!) 21 (!) 24  Temp:    SpO2: 100% 98%    General: Awake, no distress.  Awake and alert and anxious appearing. CV:  2+ radial pulses.  No audible murmurs at this time. Resp:  Tachypneic with diminished breath sounds bilaterally and significant wheezing throughout all lung fields.  Increased effort with accessory muscle use. Abd:  No distention.  Soft. Other:  No significant lower extremity edema.   ED Results / Procedures / Treatments  Labs (all labs  ordered are listed, but only abnormal results are displayed) Labs Reviewed  BLOOD GAS, VENOUS - Abnormal; Notable for the following components:      Result Value   pCO2, Ven 63 (*)    pO2, Ven 58 (*)    Bicarbonate 33.2 (*)    Acid-Base Excess 5.3 (*)    All other components within normal limits  CBC WITH DIFFERENTIAL/PLATELET - Abnormal; Notable for the following components:   WBC 12.4 (*)    RBC 5.18 (*)    MCH 25.3 (*)    MCHC 29.8 (*)    RDW 15.9 (*)    Neutro Abs 8.0 (*)    Eosinophils Absolute 0.8 (*)    All other components within normal limits  COMPREHENSIVE METABOLIC PANEL - Abnormal; Notable for the following components:   Glucose, Bld 211 (*)    All other components within normal limits  RESP PANEL BY RT-PCR (FLU A&B, COVID) ARPGX2  CULTURE, BLOOD (ROUTINE X 2)  CULTURE, BLOOD (ROUTINE X 2)  MAGNESIUM  PROCALCITONIN  LACTIC ACID, PLASMA  LACTIC ACID, PLASMA  PROTIME-INR  APTT  URINALYSIS, COMPLETE (UACMP) WITH MICROSCOPIC  TROPONIN I (HIGH SENSITIVITY)     EKG  EKG is remarkable sinus tachycardia with a ventricular rate of 107, normal axis, unremarkable intervals without evidence of acute ischemia or significant arrhythmia.   RADIOLOGY Chest reviewed by myself shows no focal consoidation, effusion, edema, pneumothorax or other clear acute thoracic process. I also reviewed radiology interpretation and agree with findings described.    PROCEDURES:  Critical Care performed: Yes, see critical care procedure note(s)  .Critical Care Performed by: Lucrezia Starch, MD Authorized by: Lucrezia Starch, MD   Critical care provider statement:    Critical care time (minutes):  30   Critical care was necessary to treat or prevent imminent or life-threatening deterioration of the following conditions:  Respiratory failure   Critical care was time spent personally by me on the following activities:  Development of treatment plan with patient or surrogate,  discussions with consultants, evaluation of patient's response to treatment, examination of patient, ordering and review of laboratory studies, ordering and review of radiographic studies, ordering and performing treatments and interventions, pulse oximetry, re-evaluation of patient's condition and review of old charts     MEDICATIONS ORDERED IN ED: Medications  acetaminophen (TYLENOL) tablet 1,000 mg (has no administration in time range)  ipratropium-albuterol (DUONEB) 0.5-2.5 (3) MG/3ML nebulizer solution 9 mL (has no administration in time range)  lactated ringers bolus 1,000 mL (has no administration in time range)  ipratropium-albuterol (DUONEB) 0.5-2.5 (3) MG/3ML nebulizer solution 9 mL (9 mLs Nebulization Given 12/21/21 0849)     IMPRESSION / MDM / [place] / ED COURSE  I reviewed the triage vital signs and the nursing notes.                              Differential diagnosis includes, but is not limited to acute COPD/asthma exacerbation, pneumonia, pneumothorax, ACS, arrhythmia, anemia, embolic arrangements with lower suspicion for CHF given absence of history of this and exam not suggestive of volume overload.  Given absence of any chest or back pain today for lower suspicion for dissection.  Given clear audible wheezing bilaterally with a strong history of COPD and asthma I have a lower suspicion for PE.  EKG is remarkable sinus tachycardia with a ventricular rate of 107, normal axis, unremarkable intervals without evidence of acute ischemia or significant arrhythmia.  Chest reviewed by myself shows no focal consoidation, effusion, edema, pneumothorax or other clear acute thoracic process. I also reviewed radiology interpretation and agree with findings described.  VBG with a pH of 7.33 with a PCO2 of 63 and bicarb of 33.2 suggestive of a mild component of hypercarbic respiratory failure in the setting of some chronic hypercarbic respiratory failure.  CBC with WBC count  of 12.4 without evidence of acute anemia.  CMP is remarkable for glucose of 211 without other significant lecture light or metabolic derangements.  Troponin is nonelevated and not suggestive of ACS or myocarditis.  Magnesium is within normal limits.  Patient is initially noted to be tachycardic and tachypneic and has a leukocytosis suspect this is all related to an acute asthma exacerbation.  Mother has no evidence of focal infiltrate on chest x-ray somewhat difficult to exclude early sepsis from very early pneumonia or viral bronchitis.  Will obtain lactic and blood cultures and give IV fluid bolus while treating with DuoNebs.  I will plan to admit to medicine service for further evaluation and management.  Discussed this with admitting hospitalist.      FINAL CLINICAL IMPRESSION(S) / ED DIAGNOSES   Final diagnoses:  Acute respiratory failure with hypoxia and hypercapnia (HCC)  Severe asthma with exacerbation, unspecified whether persistent  SIRS (systemic inflammatory response syndrome) ([place])  Hyperglycemia     Rx / DC Orders   ED Discharge Orders     None        Note:  This document was prepared using Dragon voice recognition software and may include unintentional dictation errors.   Lucrezia Starch, MD 12/21/21 1005

## 2021-12-21 NOTE — ED Triage Notes (Signed)
BIB EMS from home. Called for respiratory distress x 2 days. Been using home albuterol nebs with no relief. HX of asthma and COPD and DM. Pressented with wheezing in all fields. 89% on RA. 4 liters. improved to 93%. Placed on CPAP. 1 duenob by EMS. 20G LAC. 125mg  of solumedrol. 12lead ST. BGL 250. Pt feels she has improved during transport. Initially pt could speak 1-2 word sentences.

## 2021-12-21 NOTE — H&P (Signed)
History and Physical    Patient: Rachel Nielsen HMC:[id number] DOB: [birth year] DOA: 12/21/2021 DOS: the patient was seen and examined on 12/21/2021 PCP: Jearld Fenton, NP  Patient coming from: Home  Chief Complaint:  Chief Complaint  Patient presents with   Shortness of Breath    HPI: Rachel Nielsen is a 71 y.o. female with medical history significant of HTN, GERD, major depressive disorder, Type II DM, CVA, diabetic neuropathy, GAD, OSA, HLD, constipation, obesity, COPD. She presents from home to the ED on 2/19 with worsening SOB, cough x2 weeks.  EMS provided with solumedrol and bronchodilators en route to ED which greatly improved her symptoms.  In the ED, she still had significant wheezing and increased WOB but no hypoxia.  Chest xray was negative for acute disease.  She was placed on bipap briefly for WOB and weaned down to 2L shortly after with O2 sats close to 100%. VBG: pH 7.33, pCO2 63, pO2 58, bicarb 33.2  Additionally, she also ha sinus tachycardia on presentation.   Review of Systems: As mentioned in the history of present illness. All other systems reviewed and are negative. Past Medical History:  Diagnosis Date   Allergy    Anxiety    Asthma    Colon polyps    Constipation    COPD (chronic obstructive pulmonary disease) (HCC)    Depression    GERD (gastroesophageal reflux disease)    Glaucoma    Heart attack ([place])    Hypertension    Sleep apnea    Sleep apnea    Stroke [facility])    Urinary incontinence    Past Surgical History:  Procedure Laterality Date   APPENDECTOMY     CHOLECYSTECTOMY     COLONOSCOPY WITH PROPOFOL N/A 07/20/2018   Procedure: COLONOSCOPY WITH PROPOFOL;  Surgeon: Toledo, Benay Pike, MD;  Location: [facility] ENDOSCOPY;  Service: Gastroenterology;  Laterality: N/A;   ESOPHAGOGASTRODUODENOSCOPY N/A 07/20/2018   Procedure: ESOPHAGOGASTRODUODENOSCOPY (EGD);  Surgeon: Toledo, Benay Pike, MD;  Location: [facility] ENDOSCOPY;  Service: Gastroenterology;   Laterality: N/A;   GASTRECTOMY     TUBAL LIGATION     VAGINAL HYSTERECTOMY     Social History:  reports that she has quit smoking. She has never used smokeless tobacco. She reports that she does not currently use alcohol. She reports that she does not use drugs.  Allergies  Allergen Reactions   Ace Inhibitors Cough    Family History  Problem Relation Age of Onset   Hypertension Mother    Atrial fibrillation Mother    Heart attack Mother    Hypertension Father    Stomach cancer Father    Diabetes Father    Liver cancer Father    Diabetes Sister    Hypertension Sister     Prior to Admission medications   Medication Sig Start Date End Date Taking? Authorizing Provider  Dulaglutide 1.5 MG/0.5ML SOPN Inject 1.5 mLs into the skin once a week. 11/27/21 12/27/21 Yes [provider]  ACCU-CHEK GUIDE test strip Use to check blood sugar up to twice a day as directed 10/06/21   Jearld Fenton, NP  Accu-Chek Softclix Lancets lancets Use to check blood sugar up to twice a day as directed 10/06/21   Jearld Fenton, NP  albuterol (PROVENTIL) (2.5 MG/3ML) 0.083% nebulizer solution Inhale into the lungs. 10/01/21 10/01/22  [provider]  albuterol (VENTOLIN HFA) 108 (90 Base) MCG/ACT inhaler Inhale 2-4 puffs by mouth every 4 hours as needed for wheezing, cough,  and/or shortness of breath 10/21/20   Malfi, Lupita Raider, FNP  ALPRAZolam Duanne Moron) 1 MG tablet Take 1 mg by mouth 3 (three) times daily as needed. 08/19/21   [provider]  amLODipine (NORVASC) 5 MG tablet Take 1 tablet (5 mg total) by mouth daily. 07/09/21   Jearld Fenton, NP  asenapine (SAPHRIS) 5 MG SUBL 24 hr tablet Place 5 mg under the tongue every evening. 04/22/21   [provider]  bisoprolol (ZEBETA) 10 MG tablet Take 1 tablet (10 mg total) by mouth daily. OFFICE VISIT NEEDED FOR ADDITIONAL REFILLS 07/09/21   Jearld Fenton, NP  buPROPion (WELLBUTRIN XL) 300 MG 24 hr tablet Take 1 tablet (300 mg  total) by mouth daily. 10/08/20   Carrie Mew, MD  busPIRone (BUSPAR) 15 MG tablet Take 15 mg by mouth 2 (two) times daily.  11/08/17   [provider]  DULoxetine (CYMBALTA) 60 MG capsule Take 1 capsule by mouth 2 (two) times daily. 08/13/21   [provider]  FARXIGA 10 MG TABS tablet Take 10 mg by mouth daily. 09/08/21   [provider]  fluticasone (FLONASE) 50 MCG/ACT nasal spray Place 1 spray into both nostrils daily. 10/13/21   Jearld Fenton, NP  fluticasone-salmeterol (ADVAIR) 250-50 MCG/ACT AEPB Inhale 1 puff into the lungs 2 (two) times daily. 09/09/21   [provider]  gabapentin (NEURONTIN) 600 MG tablet Take 600-900 mg by mouth 3 (three) times daily. 600 mg AM, 600 mg pm and 900 mg at bedtime 12/16/20   [provider]  GLOBAL EASE INJECT PEN NEEDLES 32G X 4 MM MISC Use as directed with insulin 05/30/20   Scarboro, Audie Clear, NP  insulin aspart (NOVOLOG) 100 UNIT/ML injection Inject 12 Units into the skin 3 (three) times daily before meals.    [provider]  insulin glargine (LANTUS) 100 UNIT/ML injection Inject 0.36 mLs (36 Units total) into the skin daily. Patient taking differently: Inject 46 Units into the skin at bedtime. 07/18/21   Jearld Fenton, NP  Insulin Pen Needle (BD PEN NEEDLE NANO U/F) 32G X 4 MM MISC Inject 1 Device into the skin daily. 05/19/21   Jearld Fenton, NP  Insulin Syringe-Needle U-100 (INSULIN SYRINGE 1CC/31GX5/16") 31G X 5/16" 1 ML MISC 1 Syringe by Does not apply route in the morning, at noon, in the evening, and at bedtime. 06/24/21   Jearld Fenton, NP  ipratropium-albuterol (DUONEB) 0.5-2.5 (3) MG/3ML SOLN Take 3 mLs by nebulization every 4 (four) hours as needed. 09/15/21 10/15/21  Little Ishikawa, MD  LATUDA 40 MG TABS tablet Take 40 mg by mouth at bedtime. 10/02/20   [provider]  linaclotide Rolan Lipa) 290 MCG CAPS capsule Take 1 capsule (290 mcg total) by mouth daily before  breakfast. 07/23/21   Jearld Fenton, NP  metFORMIN (GLUCOPHAGE) 500 MG tablet Take 1 tablet by mouth 2 (two) times daily. 09/08/21   [provider]  mirabegron ER (MYRBETRIQ) 50 MG TB24 tablet Take 1 tablet (50 mg total) by mouth daily. 11/10/21   MacDiarmid, Nicki Reaper, MD  montelukast (SINGULAIR) 10 MG tablet Take 1 tablet (10 mg total) by mouth daily. 10/08/20   Carrie Mew, MD  Olopatadine HCl 0.2 % SOLN Place 1 drop into both eyes daily. 11/07/20   Malfi, Lupita Raider, FNP  omeprazole (PRILOSEC) 40 MG capsule Take 1 capsule (40 mg total) by mouth daily. 10/08/21   Jearld Fenton, NP  pregabalin (LYRICA) 25 MG capsule  09/16/21   [provider]  rosuvastatin (CRESTOR) 10 MG tablet Take 1 tablet (10 mg total) by mouth daily. 09/29/21   Jearld Fenton, NP  traMADol (ULTRAM) 50 MG tablet Take 1 tablet by mouth 2 (two) times daily. 11/13/21   [provider]  TRAVATAN Z 0.004 % SOLN ophthalmic solution Place 1 drop into both eyes at bedtime. 10/08/20   Carrie Mew, MD  TRULICITY 1.5 BH/4.1PF SOPN Inject into the skin. 11/27/21   [provider]    Physical Exam: Vitals:   12/21/21 1200 12/21/21 1215 12/21/21 1240 12/21/21 1334  BP:  (!) 152/87 (!) 145/79   Pulse: (!) 115 (!) 114 (!) 117 (!) 113  Resp:  18    Temp:  98 F (36.7 C)  98.7 F (37.1 C)  TempSrc:  Oral  Oral  SpO2: 100% 98% 98%   Weight:      Height:       General: NAD, pleasant, able to participate in exam Cardiac: RRR, normal heart sounds, no murmurs. 2+ radial and PT pulses bilaterally Respiratory: CTAB, normal effort, No wheezes, rales or rhonchi Abdomen: soft, nontender, nondistended, no hepatic or splenomegaly, +BS Extremities: no edema. WWP. Skin: warm and dry, no rashes noted Neuro: alert and oriented, no focal deficits Psych: Normal affect and mood  Data Reviewed: Chest xray negative acute disease LA 2.7 Blood cultures pending VBG: pH 7.33, pCO2 63, pO2 58, bicarb  33.2  Assessment and Plan:  Acute hypoxic respiratory failure- multifactorial as described below. No baseline O2 requirement.VBG: pH 7.33, pCO2 63, pO2 58, bicarb 33.2 - currently stable on 2L  SIRS criteria met- tachycardia ~110, tachypnea ~23, leukocytosis 12.4. Differential is most consistent with acute exacerbation of COPD more than infection  COPD- improved - continue bronchodilator therapy scheduled and PRN - continue steroid course - continue azithromycin - oxygen saturation goal >88%, titrate oxygen supplementation accordingly and wean as tolerated  Environmental allergies- exacerbated chronic lung condition - continue home allergy treatments  Sinus tachycardia- multifactorial- result of acute stress/illness as well as medication side effect. Patient appears to be asymptomatic. HR in low 100s.  - continue to monitor and treat underlying factors  OSA- chronic, stable - CPAP QHS  HTN- poorly controlled - continue home medications  Type II DM   diabetic neuropathy-  - sSSI - continue home oral medications  Major depression disorder   GAD- - continue home medications  Advance Care Planning:   Code Status: Full Code   Consults: none   Family Communication: none  Severity of Illness: The appropriate patient status for this patient is OBSERVATION. Observation status is judged to be reasonable and necessary in order to provide the required intensity of service to ensure the patient's safety. The patient's presenting symptoms, physical exam findings, and initial radiographic and laboratory data in the context of their medical condition is felt to place them at decreased risk for further clinical deterioration. Furthermore, it is anticipated that the patient will be medically stable for discharge from the hospital within 2 midnights of admission.   Author: Richarda Osmond, MD 12/21/2021 2:38 PM  For on call review [email or web address].

## 2021-12-22 ENCOUNTER — Ambulatory Visit: Payer: Medicare HMO | Admitting: Urology

## 2021-12-22 DIAGNOSIS — F329 Major depressive disorder, single episode, unspecified: Secondary | ICD-10-CM | POA: Diagnosis present

## 2021-12-22 DIAGNOSIS — R6510 Systemic inflammatory response syndrome (SIRS) of non-infectious origin without acute organ dysfunction: Secondary | ICD-10-CM | POA: Diagnosis present

## 2021-12-22 DIAGNOSIS — Z6838 Body mass index (BMI) 38.0-38.9, adult: Secondary | ICD-10-CM | POA: Diagnosis not present

## 2021-12-22 DIAGNOSIS — G4733 Obstructive sleep apnea (adult) (pediatric): Secondary | ICD-10-CM | POA: Diagnosis present

## 2021-12-22 DIAGNOSIS — J45901 Unspecified asthma with (acute) exacerbation: Secondary | ICD-10-CM | POA: Diagnosis present

## 2021-12-22 DIAGNOSIS — E785 Hyperlipidemia, unspecified: Secondary | ICD-10-CM | POA: Diagnosis present

## 2021-12-22 DIAGNOSIS — D72829 Elevated white blood cell count, unspecified: Secondary | ICD-10-CM | POA: Diagnosis present

## 2021-12-22 DIAGNOSIS — E872 Acidosis, unspecified: Secondary | ICD-10-CM | POA: Diagnosis present

## 2021-12-22 DIAGNOSIS — J9602 Acute respiratory failure with hypercapnia: Secondary | ICD-10-CM | POA: Diagnosis present

## 2021-12-22 DIAGNOSIS — F17290 Nicotine dependence, other tobacco product, uncomplicated: Secondary | ICD-10-CM | POA: Diagnosis present

## 2021-12-22 DIAGNOSIS — F419 Anxiety disorder, unspecified: Secondary | ICD-10-CM | POA: Diagnosis present

## 2021-12-22 DIAGNOSIS — K5900 Constipation, unspecified: Secondary | ICD-10-CM | POA: Diagnosis present

## 2021-12-22 DIAGNOSIS — E1140 Type 2 diabetes mellitus with diabetic neuropathy, unspecified: Secondary | ICD-10-CM | POA: Diagnosis present

## 2021-12-22 DIAGNOSIS — Z8249 Family history of ischemic heart disease and other diseases of the circulatory system: Secondary | ICD-10-CM | POA: Diagnosis not present

## 2021-12-22 DIAGNOSIS — E669 Obesity, unspecified: Secondary | ICD-10-CM | POA: Diagnosis present

## 2021-12-22 DIAGNOSIS — Z20822 Contact with and (suspected) exposure to covid-19: Secondary | ICD-10-CM | POA: Diagnosis present

## 2021-12-22 DIAGNOSIS — Z800 Family history of malignant neoplasm of digestive organs: Secondary | ICD-10-CM | POA: Diagnosis not present

## 2021-12-22 DIAGNOSIS — Z833 Family history of diabetes mellitus: Secondary | ICD-10-CM | POA: Diagnosis not present

## 2021-12-22 DIAGNOSIS — Z8673 Personal history of transient ischemic attack (TIA), and cerebral infarction without residual deficits: Secondary | ICD-10-CM | POA: Diagnosis not present

## 2021-12-22 DIAGNOSIS — E1165 Type 2 diabetes mellitus with hyperglycemia: Secondary | ICD-10-CM | POA: Diagnosis present

## 2021-12-22 DIAGNOSIS — J441 Chronic obstructive pulmonary disease with (acute) exacerbation: Secondary | ICD-10-CM | POA: Diagnosis present

## 2021-12-22 DIAGNOSIS — J9601 Acute respiratory failure with hypoxia: Secondary | ICD-10-CM | POA: Diagnosis present

## 2021-12-22 DIAGNOSIS — I10 Essential (primary) hypertension: Secondary | ICD-10-CM | POA: Diagnosis present

## 2021-12-22 DIAGNOSIS — K219 Gastro-esophageal reflux disease without esophagitis: Secondary | ICD-10-CM | POA: Diagnosis present

## 2021-12-22 LAB — BASIC METABOLIC PANEL
Anion gap: 7 (ref 5–15)
BUN: 20 mg/dL (ref 8–23)
CO2: 30 mmol/L (ref 22–32)
Calcium: 8.8 mg/dL — ABNORMAL LOW (ref 8.9–10.3)
Chloride: 103 mmol/L (ref 98–111)
Creatinine, Ser: 0.81 mg/dL (ref 0.44–1.00)
GFR, Estimated: >60 mL/min (ref >60)
Glucose, Bld: 235 mg/dL — ABNORMAL HIGH (ref 70–99)
Potassium: 4.2 mmol/L (ref 3.5–5.1)
Sodium: 140 mmol/L (ref 135–145)

## 2021-12-22 LAB — CBC
HCT: 39.8 % (ref 36.0–46.0)
Hemoglobin: 12 g/dL (ref 12.0–15.0)
MCH: 25.3 pg — ABNORMAL LOW (ref 26.0–34.0)
MCHC: 30.2 g/dL (ref 30.0–36.0)
MCV: 83.8 fL (ref 80.0–100.0)
Platelets: 197 10*3/uL (ref 150–400)
RBC: 4.75 MIL/uL (ref 3.87–5.11)
RDW: 15.9 % — ABNORMAL HIGH (ref 11.5–15.5)
WBC: 12.6 10*3/uL — ABNORMAL HIGH (ref 4.0–10.5)
nRBC: 0 % (ref 0.0–0.2)

## 2021-12-22 LAB — LACTIC ACID, PLASMA
Lactic Acid, Venous: 1.4 mmol/L (ref 0.5–1.9)
Lactic Acid, Venous: 1.6 mmol/L (ref 0.5–1.9)

## 2021-12-22 LAB — GLUCOSE, CAPILLARY
Glucose-Capillary: 220 mg/dL — ABNORMAL HIGH (ref 70–99)
Glucose-Capillary: 232 mg/dL — ABNORMAL HIGH (ref 70–99)
Glucose-Capillary: 265 mg/dL — ABNORMAL HIGH (ref 70–99)

## 2021-12-22 MED ORDER — LIVING WELL WITH DIABETES BOOK
Freq: Once | Status: AC
  Filled 2021-12-22: qty 1

## 2021-12-22 MED ORDER — INSULIN GLARGINE-YFGN 100 UNIT/ML ~~LOC~~ SOLN
46.0000 [IU] | Freq: Every day | SUBCUTANEOUS | Status: DC
  Administered 2021-12-22: 46 [IU] via SUBCUTANEOUS
  Filled 2021-12-22 (×2): qty 0.46

## 2021-12-22 MED ORDER — IPRATROPIUM-ALBUTEROL 0.5-2.5 (3) MG/3ML IN SOLN
3.0000 mL | Freq: Four times a day (QID) | RESPIRATORY_TRACT | Status: DC
  Administered 2021-12-22 – 2021-12-23 (×4): 3 mL via RESPIRATORY_TRACT
  Filled 2021-12-22 (×5): qty 3

## 2021-12-22 MED ORDER — PANTOPRAZOLE SODIUM 40 MG PO TBEC
80.0000 mg | DELAYED_RELEASE_TABLET | Freq: Every day | ORAL | Status: DC
  Administered 2021-12-22 – 2021-12-23 (×2): 80 mg via ORAL
  Filled 2021-12-22 (×2): qty 2

## 2021-12-22 MED ORDER — MIRABEGRON ER 50 MG PO TB24
50.0000 mg | ORAL_TABLET | Freq: Every day | ORAL | Status: DC
  Administered 2021-12-22 – 2021-12-23 (×2): 50 mg via ORAL
  Filled 2021-12-22 (×2): qty 1

## 2021-12-22 MED ORDER — MONTELUKAST SODIUM 10 MG PO TABS
10.0000 mg | ORAL_TABLET | Freq: Every day | ORAL | Status: DC
  Administered 2021-12-22: 10 mg via ORAL
  Filled 2021-12-22: qty 1

## 2021-12-22 MED ORDER — LINACLOTIDE 145 MCG PO CAPS
290.0000 ug | ORAL_CAPSULE | Freq: Every day | ORAL | Status: DC
  Administered 2021-12-22 – 2021-12-23 (×2): 290 ug via ORAL
  Filled 2021-12-22: qty 1
  Filled 2021-12-22 (×2): qty 2

## 2021-12-22 MED ORDER — LATANOPROST 0.005 % OP SOLN
1.0000 [drp] | Freq: Every day | OPHTHALMIC | Status: DC
  Administered 2021-12-22: 1 [drp] via OPHTHALMIC
  Filled 2021-12-22: qty 2.5

## 2021-12-22 MED ORDER — OLOPATADINE HCL 0.1 % OP SOLN
1.0000 [drp] | Freq: Two times a day (BID) | OPHTHALMIC | Status: DC
  Administered 2021-12-22 – 2021-12-23 (×3): 1 [drp] via OPHTHALMIC
  Filled 2021-12-22: qty 5

## 2021-12-22 NOTE — Progress Notes (Signed)
Progress Note:    Rachel Nielsen    XTK:[id number] DOB: December 23, 1950 DOA: 12/21/2021  PCP: Jearld Fenton, NP    Brief Narrative:   Rachel Nielsen is a 71 y.o. female with medical history significant of HTN, GERD, major depressive disorder, Type II DM, CVA, diabetic neuropathy, GAD, OSA, HLD, constipation, obesity, COPD. She presents from home to the ED on 2/19 with worsening SOB, cough x2 weeks.  EMS provided with solumedrol and bronchodilators en route to ED which greatly improved her symptoms.  In the ED, she still had significant wheezing and increased WOB but no hypoxia.  Chest xray was negative for acute disease.  She was placed on bipap briefly for WOB and weaned down to 2L shortly after with O2 sats close to 100%. VBG: pH 7.33, pCO2 63, pO2 58, bicarb 33.2   Additionally, she also had sinus tachycardia on presentation.      Subjective:   Feeling better.  Remains on 2 L.  Conversational dyspnea improved.  Can speak in more full sentences now.  Not on oxygen at baseline.  Wants to be attempted to be weaned off instead of going on home oxygen.    Assessment and Plan:   Acute hypoxic respiratory failure: Secondary to COPD exacerbation.  Briefly required BiPAP on initial presentation.  Now on 2 L and saturating well.  Wean oxygen as tolerated.  Plan home oxygen screening tomorrow.  Not on oxygen at baseline.  Start scheduled DuoNebs.  Continue oral prednisone  SIRS: Criteria met with tachycardia, tachypnea and leukocytosis.  No evidence of infection.  Lactic acidosis resolved with IV fluids.  Plan as above  Environmental allergies: Continue home meds  Obstructive sleep apnea: Continue CPAP nightly  Hypertension: Continue amlodipine, Zebeta  Depression/anxiety: Continue home Wellbutrin, BuSpar, Latuda  Dyslipidemia: Continue Crestor  Morbid obesity: No acute treatment  IDDM 2: Uncontrolled with hemoglobin A1c 9.0. Monitor for steroid-induced hyperglycemia.   Resume home long-acting insulin 46 units.  Holding home scheduled short acting mealtime insulin.  Continue home [place].  Continue sliding scale insulin.    Other information:    DVT prophylaxis: Lovenox subcu Code Status: Full code Family Communication: None Disposition:   Status is: Observation The patient will require care spanning > 2 midnights and should be moved to inpatient because: Still requiring 2 L of oxygen        Consultants:   None    Objective:    Vitals:   12/22/21 0321 12/22/21 0335 12/22/21 0918 12/22/21 1138  BP: (!) 145/84 (!) 145/84 130/85 138/87  Pulse: 99 99 92 88  Resp:  (!) 21 16 16   Temp:  98.5 F (36.9 C) 97.9 F (36.6 C) 97.8 F (36.6 C)  TempSrc:  Oral    SpO2: 97% 97% 100% 99%  Weight:      Height:        Intake/Output Summary (Last 24 hours) at 12/22/2021 1339 Last data filed at 12/22/2021 1238 Gross per 24 hour  Intake 3090 ml  Output 3100 ml  Net -10 ml   Filed Weights   12/21/21 0827  Weight: 102.1 kg       Physical Exam:    General exam: Appears calm and comfortable  Respiratory system: Clear to auscultation. Respiratory effort normal.  Mild expiratory wheezing. Cardiovascular system: S1 & S2 heard, RRR. No JVD, murmurs, rubs, gallops or clicks. No pedal edema. Gastrointestinal system: Abdomen is nondistended, soft and nontender. No organomegaly or masses felt. Normal  bowel sounds heard. Central nervous system: Alert and oriented. No focal neurological deficits. Extremities: Symmetric 5 x 5 power. Skin: No rashes, lesions or ulcers Psychiatry: Flat affect    Data Reviewed:    I have personally reviewed following labs and imaging studies  CBC: Recent Labs  Lab 12/21/21 0833 12/22/21 0441  WBC 12.4* 12.6*  NEUTROABS 8.0*  --   HGB 13.1 12.0  HCT 43.9 39.8  MCV 84.7 83.8  PLT 234 409    Basic Metabolic Panel: Recent Labs  Lab 12/21/21 0833 12/22/21 0441  NA 139 140  K 4.2 4.2  CL 98 103   CO2 30 30  GLUCOSE 211* 235*  BUN 17 20  CREATININE 0.96 0.81  CALCIUM 9.2 8.8*  MG 2.0  --     GFR: Estimated Creatinine Clearance: 77.3 mL/min (by C-G formula based on SCr of 0.81 mg/dL).  Liver Function Tests: Recent Labs  Lab 12/21/21 0833  AST 26  ALT 23  ALKPHOS 96  BILITOT 0.6  PROT 7.5  ALBUMIN 3.8    CBG: Recent Labs  Lab 12/21/21 1706 12/22/21 0759 12/22/21 1224  GLUCAP 284* 220* 232*     Recent Results (from the past 240 hour(s))  Resp Panel by RT-PCR (Flu A&B, Covid) Nasopharyngeal Swab     Status: None   Collection Time: 12/21/21  8:30 AM   Specimen: Nasopharyngeal Swab; Nasopharyngeal(NP) swabs in vial transport medium  Result Value Ref Range Status   SARS Coronavirus 2 by RT PCR NEGATIVE NEGATIVE Final    Comment: (NOTE) SARS-CoV-2 target nucleic acids are NOT DETECTED.  The SARS-CoV-2 RNA is generally detectable in upper respiratory specimens during the acute phase of infection. The lowest concentration of SARS-CoV-2 viral copies this assay can detect is 138 copies/mL. A negative result does not preclude SARS-Cov-2 infection and should not be used as the sole basis for treatment or other patient management decisions. A negative result may occur with  improper specimen collection/handling, submission of specimen other than nasopharyngeal swab, presence of viral mutation(s) within the areas targeted by this assay, and inadequate number of viral copies(<138 copies/mL). A negative result must be combined with clinical observations, patient history, and epidemiological information. The expected result is Negative.  Fact Sheet for Patients:  EntrepreneurPulse.com.au  Fact Sheet for Healthcare Providers:  IncredibleEmployment.be  This test is no t yet approved or cleared by the [place] FDA and  has been authorized for detection and/or diagnosis of SARS-CoV-2 by FDA under an Emergency Use Authorization  (EUA). This EUA will remain  in effect (meaning this test can be used) for the duration of the COVID-19 declaration under Section 564(b)(1) of the Act, 21 U.S.C.section 360bbb-3(b)(1), unless the authorization is terminated  or revoked sooner.       Influenza A by PCR NEGATIVE NEGATIVE Final   Influenza B by PCR NEGATIVE NEGATIVE Final    Comment: (NOTE) The Xpert Xpress SARS-CoV-2/FLU/RSV plus assay is intended as an aid in the diagnosis of influenza from Nasopharyngeal swab specimens and should not be used as a sole basis for treatment. Nasal washings and aspirates are unacceptable for Xpert Xpress SARS-CoV-2/FLU/RSV testing.  Fact Sheet for Patients: EntrepreneurPulse.com.au  Fact Sheet for Healthcare Providers: IncredibleEmployment.be  This test is not yet approved or cleared by the [place] FDA and has been authorized for detection and/or diagnosis of SARS-CoV-2 by FDA under an Emergency Use Authorization (EUA). This EUA will remain in effect (meaning this test can be used) for the duration  of the COVID-19 declaration under Section 564(b)(1) of the Act, 21 U.S.C. section 360bbb-3(b)(1), unless the authorization is terminated or revoked.  Performed at [facility], [address] [place], [place] [address]   Blood Culture (routine x 2)     Status: None (Preliminary result)   Collection Time: 12/21/21  1:26 PM   Specimen: BLOOD  Result Value Ref Range Status   Specimen Description BLOOD BLOOD RIGHT FOREARM  Final   Special Requests   Final    BOTTLES DRAWN AEROBIC AND ANAEROBIC Blood Culture adequate volume   Culture   Final    NO GROWTH < 24 HOURS Performed at [facility], [address] [place], [place] [address]    Report Status PENDING  Incomplete  Blood Culture (routine x 2)     Status: None (Preliminary result)   Collection Time: 12/21/21  1:29 PM   Specimen: BLOOD  Result Value Ref Range Status    Specimen Description BLOOD BLOOD RIGHT HAND  Final   Special Requests   Final    BOTTLES DRAWN AEROBIC AND ANAEROBIC Blood Culture adequate volume   Culture   Final    NO GROWTH < 24 HOURS Performed at [facility], [address], [place], [place] [address]    Report Status PENDING  Incomplete         Radiology Studies:    DG Chest Portable 1 View  Result Date: 12/21/2021 CLINICAL DATA:  Short of breath. EXAM: PORTABLE CHEST 1 VIEW COMPARISON:  09/15/2021 FINDINGS: Heart size and mediastinal contours appear normal. Asymmetric elevation of the right hemidiaphragm is unchanged. No signs of pleural effusion, interstitial edema or airspace disease. No acute osseous findings. IMPRESSION: No active cardiopulmonary abnormalities. Electronically Signed   By: Kerby Moors M.D.   On: 12/21/2021 09:01        Medications:    Scheduled Meds:  acetaminophen  1,000 mg Oral Once   amLODipine  5 mg Oral Daily   bisoprolol  10 mg Oral Daily   buPROPion  300 mg Oral Daily   busPIRone  15 mg Oral BID   dapagliflozin propanediol  10 mg Oral Daily   DULoxetine  60 mg Oral BID   enoxaparin (LOVENOX) injection  0.5 mg/kg Subcutaneous Q24H   insulin aspart  0-9 Units Subcutaneous TID WC   insulin glargine-yfgn  46 Units Subcutaneous QHS   latanoprost  1 drop Both Eyes QHS   linaclotide  290 mcg Oral QAC breakfast   lurasidone  40 mg Oral QHS   mirabegron ER  50 mg Oral Daily   montelukast  10 mg Oral QHS   olopatadine  1 drop Both Eyes BID   pantoprazole  80 mg Oral Daily   polyethylene glycol  17 g Oral Daily   predniSONE  60 mg Oral Q breakfast   rosuvastatin  10 mg Oral Daily   Continuous Infusions:  sodium chloride 150 mL/hr at 12/22/21 1238       LOS: 0 days    Time spent: 35 minutes    Leslee Home, MD [facility]   To contact the attending provider between 7A-7P or the covering provider during after hours 7P-7A, please log into the web site  [email or web address] and access using universal [place] password for that web site. If you do not have the password, please call the hospital operator.  12/22/2021, 1:39 PM

## 2021-12-22 NOTE — TOC Initial Note (Signed)
Transition of Care [facility]) - Initial/Assessment Note    Patient Details  Name: Rachel Nielsen MRN: [id number] Date of Birth: [birth year]  Transition of Care [facility]) CM/SW Contact:    Conception Oms, RN Phone Number: 12/22/2021, 8:56 AM  Clinical Narrative:          From home, has home nebulizer, TOC to monitor for Home Oxygen needs   Transition of Care [facility]) Screening Note   Patient Details  Name: Rachel Nielsen Date of Birth: 08/12/1951   Transition of Care [facility]) CM/SW Contact:    Conception Oms, RN Phone Number: 12/22/2021, 8:56 AM    Transition of Care Department [facility]) has reviewed patient and no TOC needs have been identified at this time. We will continue to monitor patient advancement through interdisciplinary progression rounds. If new patient transition needs arise, please place a TOC consult.                 Patient Goals and CMS Choice        Expected Discharge Plan and Services                                                Prior Living Arrangements/Services                       Activities of Daily Living Home Assistive Devices/Equipment: Environmental consultant (specify type), Cane (specify quad or straight), Nebulizer, Shower chair with back, Dentures (specify type), Eyeglasses ADL Screening (condition at time of admission) Patient's cognitive ability adequate to safely complete daily activities?: Yes Is the patient deaf or have difficulty hearing?: No Does the patient have difficulty seeing, even when wearing glasses/contacts?: No Does the patient have difficulty concentrating, remembering, or making decisions?: No Patient able to express need for assistance with ADLs?: Yes Does the patient have difficulty dressing or bathing?: No Independently performs ADLs?: Yes (appropriate for developmental age) Does the patient have difficulty walking or climbing stairs?: Yes (left sided weakness from stroke) Weakness of Legs: Left Weakness of  Arms/Hands: None  Permission Sought/Granted                  Emotional Assessment              Admission diagnosis:  Hyperglycemia [R73.9] SIRS (systemic inflammatory response syndrome) ([place]) [R65.10] Acute respiratory failure with hypoxia and hypercapnia (HCC) [J96.01, J96.02] Acute hypoxemic respiratory failure (HCC) [J96.01] Severe asthma with exacerbation, unspecified whether persistent [J45.901] Patient Active Problem List   Diagnosis Date Noted   Acute hypoxemic respiratory failure ([place]) 12/21/2021   Acute respiratory failure with hypoxia and hypercapnia (HCC)    Severe asthma with exacerbation    SIRS (systemic inflammatory response syndrome) ([place])    COPD with acute exacerbation ([place]) 09/15/2021   Acute respiratory failure with hypoxia ([place]) 09/15/2021   Mixed diabetic hyperlipidemia associated with type 2 diabetes mellitus ([place]) 07/09/2021   Chronic constipation 07/09/2021   Cervical radiculopathy 07/09/2021   Lumbar radiculopathy 07/09/2021   Class 2 severe obesity due to excess calories with serious comorbidity and body mass index (BMI) of 38.0 to 38.9 in adult ([place]) 07/09/2021   OSA (obstructive sleep apnea) 12/18/2020   Diabetic neuropathy ([place]) 12/17/2020   Centrilobular emphysema ([place]) 12/17/2020   GAD (generalized anxiety disorder) 12/17/2020   History of stroke 10/21/2020   Uncontrolled type  2 diabetes mellitus with hyperglycemia ([place]) 03/14/2019   Hypertension 12/21/2017   GERD (gastroesophageal reflux disease) 12/21/2017   Major depressive disorder, recurrent, moderate ([place]) 12/21/2017   PCP:  Jearld Fenton, NP Pharmacy:   [facility], [place] [address] Phone: [phone] Fax: [phone]  [facility] [place], [place] [address] Phone: [phone] Fax: [phone]     Social Determinants of Health (SDOH)  Interventions    Readmission Risk Interventions No flowsheet data found.

## 2021-12-23 ENCOUNTER — Telehealth: Payer: Self-pay

## 2021-12-23 LAB — CBC
HCT: 40.7 % (ref 36.0–46.0)
Hemoglobin: 12.4 g/dL (ref 12.0–15.0)
MCH: 25.5 pg — ABNORMAL LOW (ref 26.0–34.0)
MCHC: 30.5 g/dL (ref 30.0–36.0)
MCV: 83.6 fL (ref 80.0–100.0)
Platelets: 183 10*3/uL (ref 150–400)
RBC: 4.87 MIL/uL (ref 3.87–5.11)
RDW: 16.1 % — ABNORMAL HIGH (ref 11.5–15.5)
WBC: 13.7 10*3/uL — ABNORMAL HIGH (ref 4.0–10.5)
nRBC: 0 % (ref 0.0–0.2)

## 2021-12-23 LAB — GLUCOSE, CAPILLARY
Glucose-Capillary: 139 mg/dL — ABNORMAL HIGH (ref 70–99)
Glucose-Capillary: 287 mg/dL — ABNORMAL HIGH (ref 70–99)

## 2021-12-23 MED ORDER — PREDNISONE 50 MG PO TABS: 50.0000 mg | ORAL_TABLET | Freq: Every day | ORAL | 0 refills | Status: AC

## 2021-12-23 MED ORDER — ALBUTEROL SULFATE (2.5 MG/3ML) 0.083% IN NEBU
2.5000 mg | INHALATION_SOLUTION | RESPIRATORY_TRACT | 12 refills | Status: AC | PRN
Start: 2021-12-23 — End: 2024-08-29

## 2021-12-23 MED ORDER — AMOXICILLIN-POT CLAVULANATE 875-125 MG PO TABS: 1.0000 | ORAL_TABLET | Freq: Two times a day (BID) | ORAL | 0 refills | Status: AC

## 2021-12-23 NOTE — Discharge Summary (Signed)
Physician Discharge Summary   Patient: Rachel Nielsen MRN: [id number] DOB: [birth year]  Admit date:     12/21/2021  Discharge date: 12/23/21  Discharge Physician: Lorella Nimrod   PCP: Jearld Fenton, NP   Recommendations at discharge:  Follow-up with primary care provider Follow-up with psychiatrist for psych meds recommendations.  Patient is on multiple psych meds which needs reconciliation by a psychiatrist.  Discharge Diagnoses: Principal Problem:   Acute hypoxemic respiratory failure ([place]) Active Problems:   COPD with acute exacerbation ([place])   Acute respiratory failure with hypoxia and hypercapnia ([facility])   Severe asthma with exacerbation   SIRS (systemic inflammatory response syndrome) [facility])   Hospital Course: Rachel Nielsen is a 71 y.o. female with medical history significant of HTN, GERD, major depressive disorder, Type II DM, CVA, diabetic neuropathy, GAD, OSA, HLD, constipation, obesity, COPD. She presents from home to the ED on 2/19 with worsening SOB, cough x2 weeks.  Patient admitted for COPD exacerbation.  Initially requiring BiPAP for short.  Of time.  Received bronchodilators and steroids which improved her symptoms.  She was able to weaned off from oxygen and now saturating well on room air. Patient was discharged on 4 more days of prednisone.  She was also given a 5-day course of Augmentin for concern of right ear infection.  She needs to follow-up with PCP for further recommendations.  Patient has uncontrolled diabetes mellitus with hyperglycemia and A1c of 9.  She needs a close monitoring by PCP for better control of her diabetes.  Patient is also on multiple psych meds, needs reconciliation by a psychiatrist to avoid unnecessary side effects.  She will continue with rest of her home medications and follow-up with her providers.   Consultants: None Procedures performed: None Disposition: Home Diet recommendation:  Discharge Diet Orders (From admission, onward)      Start     Ordered   12/23/21 0000  Diet - low sodium heart healthy        12/23/21 1119           Cardiac and Carb modified diet  DISCHARGE MEDICATION: Allergies as of 12/23/2021       Reactions   Ace Inhibitors Cough        Medication List     STOP taking these medications    fluticasone 50 MCG/ACT nasal spray Commonly known as: FLONASE   pregabalin 25 MG capsule Commonly known as: LYRICA       TAKE these medications    Accu-Chek Guide test strip Generic drug: glucose blood Use to check blood sugar up to twice a day as directed   Accu-Chek Softclix Lancets lancets Use to check blood sugar up to twice a day as directed   albuterol 108 (90 Base) MCG/ACT inhaler Commonly known as: VENTOLIN HFA Inhale 2-4 puffs by mouth every 4 hours as needed for wheezing, cough, and/or shortness of breath What changed: Another medication with the same name was changed. Make sure you understand how and when to take each.   albuterol (2.5 MG/3ML) 0.083% nebulizer solution Commonly known as: PROVENTIL Inhale 3 mLs (2.5 mg total) into the lungs every 4 (four) hours as needed for wheezing or shortness of breath. What changed:  how much to take when to take this reasons to take this   ALPRAZolam 1 MG tablet Commonly known as: XANAX Take 1 mg by mouth 3 (three) times daily as needed.   amLODipine 5 MG tablet Commonly known as: NORVASC Take 1 tablet (  5 mg total) by mouth daily.   amoxicillin-clavulanate 875-125 MG tablet Commonly known as: AUGMENTIN Take 1 tablet by mouth 2 (two) times daily for 5 days.   asenapine 5 MG Subl 24 hr tablet Commonly known as: SAPHRIS Place 5 mg under the tongue every evening.   bisoprolol 10 MG tablet Commonly known as: ZEBETA Take 1 tablet (10 mg total) by mouth daily. OFFICE VISIT NEEDED FOR ADDITIONAL REFILLS   buPROPion 300 MG 24 hr tablet Commonly known as: WELLBUTRIN XL Take 1 tablet (300 mg total) by mouth daily.    busPIRone 15 MG tablet Commonly known as: BUSPAR Take 15 mg by mouth 2 (two) times daily.   Dulaglutide 1.5 MG/0.5ML Sopn Inject 1.5 mLs into the skin once a week.   DULoxetine 60 MG capsule Commonly known as: CYMBALTA Take 1 capsule by mouth 2 (two) times daily.   Farxiga 10 MG Tabs tablet Generic drug: dapagliflozin propanediol Take 10 mg by mouth daily.   fluticasone-salmeterol 250-50 MCG/ACT Aepb Commonly known as: ADVAIR Inhale 1 puff into the lungs 2 (two) times daily.   gabapentin 600 MG tablet Commonly known as: NEURONTIN Take 600-900 mg by mouth 3 (three) times daily. 600 mg AM, 600 mg pm and 900 mg at bedtime   Global Ease Inject Pen Needles 32G X 4 MM Misc Generic drug: Insulin Pen Needle Use as directed with insulin   BD Pen Needle Nano U/F 32G X 4 MM Misc Generic drug: Insulin Pen Needle Inject 1 Device into the skin daily.   insulin aspart 100 UNIT/ML injection Commonly known as: novoLOG Inject 12 Units into the skin 3 (three) times daily before meals.   insulin glargine 100 UNIT/ML injection Commonly known as: Lantus Inject 0.36 mLs (36 Units total) into the skin daily. What changed:  how much to take when to take this   INSULIN SYRINGE 1CC/31GX5/16" 31G X 5/16" 1 ML Misc 1 Syringe by Does not apply route in the morning, at noon, in the evening, and at bedtime.   ipratropium-albuterol 0.5-2.5 (3) MG/3ML Soln Commonly known as: DUONEB Take 3 mLs by nebulization every 4 (four) hours as needed.   Latuda 40 MG Tabs tablet Generic drug: lurasidone Take 40 mg by mouth at bedtime.   linaclotide 290 MCG Caps capsule Commonly known as: Linzess Take 1 capsule (290 mcg total) by mouth daily before breakfast.   metFORMIN 500 MG tablet Commonly known as: GLUCOPHAGE Take 1 tablet by mouth 2 (two) times daily.   mirabegron ER 50 MG Tb24 tablet Commonly known as: MYRBETRIQ Take 1 tablet (50 mg total) by mouth daily.   montelukast 10 MG  tablet Commonly known as: SINGULAIR Take 1 tablet (10 mg total) by mouth daily.   Olopatadine HCl 0.2 % Soln Place 1 drop into both eyes daily.   omeprazole 40 MG capsule Commonly known as: PRILOSEC Take 1 capsule (40 mg total) by mouth daily.   predniSONE 50 MG tablet Commonly known as: DELTASONE Take 1 tablet (50 mg total) by mouth daily with breakfast for 4 days. Start taking on: December 24, 2021   rosuvastatin 10 MG tablet Commonly known as: CRESTOR Take 1 tablet (10 mg total) by mouth daily.   traMADol 50 MG tablet Commonly known as: ULTRAM Take 1 tablet by mouth 2 (two) times daily.   Travatan Z 0.004 % Soln ophthalmic solution Generic drug: Travoprost (BAK Free) Place 1 drop into both eyes at bedtime.        Follow-up Information  Jearld Fenton, NP. Schedule an appointment as soon as possible for a visit in 1 week(s).   Specialties: Internal Medicine, Emergency Medicine Contact information: [place] [address] [phone]                 Discharge Exam: Danley Danker Weights   12/21/21 0827  Weight: 102.1 kg   General.     In no acute distress. Pulmonary.  Lungs clear bilaterally, normal respiratory effort. CV.  Regular rate and rhythm, no JVD, rub or murmur. Abdomen.  Soft, nontender, nondistended, BS positive. CNS.  Alert and oriented x3.  No focal neurologic deficit. Extremities.  No edema, no cyanosis, pulses intact and symmetrical. Psychiatry.  Judgment and insight appears normal.   Condition at discharge: stable  The results of significant diagnostics from this hospitalization (including imaging, microbiology, ancillary and laboratory) are listed below for reference.   Imaging Studies: DG Chest Portable 1 View  Result Date: 12/21/2021 CLINICAL DATA:  Short of breath. EXAM: PORTABLE CHEST 1 VIEW COMPARISON:  09/15/2021 FINDINGS: Heart size and mediastinal contours appear normal. Asymmetric elevation of the right hemidiaphragm  is unchanged. No signs of pleural effusion, interstitial edema or airspace disease. No acute osseous findings. IMPRESSION: No active cardiopulmonary abnormalities. Electronically Signed   By: Kerby Moors M.D.   On: 12/21/2021 09:01    Microbiology: Results for orders placed or performed during the hospital encounter of 12/21/21  Resp Panel by RT-PCR (Flu A&B, Covid) Nasopharyngeal Swab     Status: None   Collection Time: 12/21/21  8:30 AM   Specimen: Nasopharyngeal Swab; Nasopharyngeal(NP) swabs in vial transport medium  Result Value Ref Range Status   SARS Coronavirus 2 by RT PCR NEGATIVE NEGATIVE Final    Comment: (NOTE) SARS-CoV-2 target nucleic acids are NOT DETECTED.  The SARS-CoV-2 RNA is generally detectable in upper respiratory specimens during the acute phase of infection. The lowest concentration of SARS-CoV-2 viral copies this assay can detect is 138 copies/mL. A negative result does not preclude SARS-Cov-2 infection and should not be used as the sole basis for treatment or other patient management decisions. A negative result may occur with  improper specimen collection/handling, submission of specimen other than nasopharyngeal swab, presence of viral mutation(s) within the areas targeted by this assay, and inadequate number of viral copies(<138 copies/mL). A negative result must be combined with clinical observations, patient history, and epidemiological information. The expected result is Negative.  Fact Sheet for Patients:  EntrepreneurPulse.com.au  Fact Sheet for Healthcare Providers:  IncredibleEmployment.be  This test is no t yet approved or cleared by the [place] FDA and  has been authorized for detection and/or diagnosis of SARS-CoV-2 by FDA under an Emergency Use Authorization (EUA). This EUA will remain  in effect (meaning this test can be used) for the duration of the COVID-19 declaration under Section 564(b)(1) of  the Act, 21 U.S.C.section 360bbb-3(b)(1), unless the authorization is terminated  or revoked sooner.       Influenza A by PCR NEGATIVE NEGATIVE Final   Influenza B by PCR NEGATIVE NEGATIVE Final    Comment: (NOTE) The Xpert Xpress SARS-CoV-2/FLU/RSV plus assay is intended as an aid in the diagnosis of influenza from Nasopharyngeal swab specimens and should not be used as a sole basis for treatment. Nasal washings and aspirates are unacceptable for Xpert Xpress SARS-CoV-2/FLU/RSV testing.  Fact Sheet for Patients: EntrepreneurPulse.com.au  Fact Sheet for Healthcare Providers: IncredibleEmployment.be  This test is not yet approved or cleared by the  [place] Architectural technologist and has been authorized for detection and/or diagnosis of SARS-CoV-2 by FDA under an Print production planner (EUA). This EUA will remain in effect (meaning this test can be used) for the duration of the COVID-19 declaration under Section 564(b)(1) of the Act, 21 U.S.C. section 360bbb-3(b)(1), unless the authorization is terminated or revoked.  Performed at [facility], [address] [place], [place] [address]   Blood Culture (routine x 2)     Status: None (Preliminary result)   Collection Time: 12/21/21  1:26 PM   Specimen: BLOOD  Result Value Ref Range Status   Specimen Description BLOOD BLOOD RIGHT FOREARM  Final   Special Requests   Final    BOTTLES DRAWN AEROBIC AND ANAEROBIC Blood Culture adequate volume   Culture   Final    NO GROWTH 2 DAYS Performed at [facility], [address] [place], [place] [address]    Report Status PENDING  Incomplete  Blood Culture (routine x 2)     Status: None (Preliminary result)   Collection Time: 12/21/21  1:29 PM   Specimen: BLOOD  Result Value Ref Range Status   Specimen Description BLOOD BLOOD RIGHT HAND  Final   Special Requests   Final    BOTTLES DRAWN AEROBIC AND ANAEROBIC Blood Culture adequate  volume   Culture   Final    NO GROWTH 2 DAYS Performed at [facility], [address] [place], [place] [address]    Report Status PENDING  Incomplete    Labs: CBC: Recent Labs  Lab 12/21/21 0833 12/22/21 0441 12/23/21 0324  WBC 12.4* 12.6* 13.7*  NEUTROABS 8.0*  --   --   HGB 13.1 12.0 12.4  HCT 43.9 39.8 40.7  MCV 84.7 83.8 83.6  PLT 234 197 093   Basic Metabolic Panel: Recent Labs  Lab 12/21/21 0833 12/22/21 0441  NA 139 140  K 4.2 4.2  CL 98 103  CO2 30 30  GLUCOSE 211* 235*  BUN 17 20  CREATININE 0.96 0.81  CALCIUM 9.2 8.8*  MG 2.0  --    Liver Function Tests: Recent Labs  Lab 12/21/21 0833  AST 26  ALT 23  ALKPHOS 96  BILITOT 0.6  PROT 7.5  ALBUMIN 3.8   CBG: Recent Labs  Lab 12/21/21 1706 12/22/21 0759 12/22/21 1224 12/22/21 1624 12/23/21 0735  GLUCAP 284* 220* 232* 265* 139*    Discharge time spent: greater than 30 minutes.  Signed: Lorella Nimrod, MD [facility] 12/23/2021

## 2021-12-23 NOTE — Progress Notes (Signed)
Inpatient Diabetes Program Recommendations  AACE/ADA: New Consensus Statement on Inpatient Glycemic Control (2015)  Target Ranges:  Prepandial:   less than 140 mg/dL      Peak postprandial:   less than 180 mg/dL (1-2 hours)      Critically ill patients:  140 - 180 mg/dL   Lab Results  Component Value Date   GLUCAP 139 (H) 12/23/2021   HGBA1C 9.0 (H) 12/21/2021    Review of Glycemic Control  Latest Reference Range & Units 12/22/21 12:24 12/22/21 16:24 12/23/21 07:35  Glucose-Capillary 70 - 99 mg/dL 232 (H) 265 (H) 139 (H)  (H): Data is abnormally high Diabetes history:  Type 2 Dm Outpatient Diabetes medications: Dulaglutide 1.5 mg qwk, Farxiga 10 mg QD, Novolog 12 units TID, Lantus 46 units QHS, Metformin 500 mg BID Current orders for Inpatient glycemic control: Semglee 46 units QHS, Farxiga 10 mg QD, Novolog 0-9 units TID Prednisone 60 mg QAM  Inpatient Diabetes Program Recommendations:    In the setting of steroids, consider adding Novolog 4 units TID (assuming patient is consuming >50% of meals).   Thanks, Bronson Curb, MSN, RNC-OB Diabetes Coordinator [phone] (8a-5p)

## 2021-12-23 NOTE — Plan of Care (Signed)
  Problem: Education: Goal: Knowledge of General Education information will improve Description: Including pain rating scale, medication(s)/side effects and non-pharmacologic comfort measures Outcome: Progressing   Problem: Clinical Measurements: Goal: Ability to maintain clinical measurements within normal limits will improve Outcome: Progressing Goal: Will remain free from infection Outcome: Progressing Goal: Diagnostic test results will improve Outcome: Progressing Goal: Respiratory complications will improve Outcome: Progressing Goal: Cardiovascular complication will be avoided Outcome: Progressing   Problem: Activity: Goal: Risk for activity intolerance will decrease Outcome: Progressing   Problem: Coping: Goal: Level of anxiety will decrease Outcome: Progressing   Problem: Elimination: Goal: Will not experience complications related to bowel motility Outcome: Progressing Goal: Will not experience complications related to urinary retention Outcome: Progressing   Problem: Pain Managment: Goal: General experience of comfort will improve Outcome: Progressing   Problem: Safety: Goal: Ability to remain free from injury will improve Outcome: Progressing   Problem: Skin Integrity: Goal: Risk for impaired skin integrity will decrease Outcome: Progressing   

## 2021-12-23 NOTE — Plan of Care (Signed)

## 2021-12-23 NOTE — Telephone Encounter (Signed)
LVM to return call.

## 2021-12-25 ENCOUNTER — Other Ambulatory Visit: Payer: Self-pay | Admitting: Internal Medicine

## 2021-12-25 DIAGNOSIS — E1165 Type 2 diabetes mellitus with hyperglycemia: Secondary | ICD-10-CM

## 2021-12-25 NOTE — Telephone Encounter (Signed)
Requested medication (s) are due for refill today - yes  Requested medication (s) are on the active medication list -yes  Future visit scheduled -yes  Last refill: 09/29/21 #90  Notes to clinic: Request RF: fails lab protocol- no record of labs in protocol  Requested Prescriptions  Pending Prescriptions Disp Refills   rosuvastatin (CRESTOR) 10 MG tablet [Pharmacy Med Name: ROSUVASTATIN CALCIUM 10 MG TAB] 90 tablet 0    Sig: Take 1 tablet (10 mg total) by mouth daily.     Cardiovascular:  Antilipid - Statins 2 Failed - 12/25/2021  1:07 PM      Failed - Lipid Panel in normal range within the last 12 months    No results found for: CHOL, POCCHOL, CHOLTOT No results found for: LDLCALC, LDLC, HIRISKLDL, POCLDL, LDLDIRECT, REALLDLC, TOTLDLC No results found for: HDL, POCHDL No results found for: TRIG, POCTRIG       Passed - Cr in normal range and within 360 days    Creatinine, Ser  Date Value Ref Range Status  12/22/2021 0.81 0.44 - 1.00 mg/dL Final          Passed - Patient is not pregnant      Passed - Valid encounter within last 12 months    Recent Outpatient Visits           5 months ago Mixed diabetic hyperlipidemia associated with type 2 diabetes mellitus ([place])   [facility] [place], Coralie Keens, NP   1 year ago Uncontrolled type 2 diabetes mellitus with hyperglycemia [facility])   [facility] Olin Hauser, DO   1 year ago Encounter to establish care with new doctor   [facility], Lupita Raider, FNP       Future Appointments             In 4 days MacDiarmid, Nicki Reaper, MD [place]   In 1 week [place], Coralie Keens, NP [facility], [facility]               Requested Prescriptions  Pending Prescriptions Disp Refills   rosuvastatin (CRESTOR) 10 MG tablet [Pharmacy Med Name: ROSUVASTATIN CALCIUM 10 MG TAB] 90 tablet 0    Sig: Take 1 tablet (10 mg total) by mouth daily.      Cardiovascular:  Antilipid - Statins 2 Failed - 12/25/2021  1:07 PM      Failed - Lipid Panel in normal range within the last 12 months    No results found for: CHOL, POCCHOL, CHOLTOT No results found for: LDLCALC, LDLC, HIRISKLDL, POCLDL, LDLDIRECT, REALLDLC, TOTLDLC No results found for: HDL, POCHDL No results found for: TRIG, POCTRIG       Passed - Cr in normal range and within 360 days    Creatinine, Ser  Date Value Ref Range Status  12/22/2021 0.81 0.44 - 1.00 mg/dL Final          Passed - Patient is not pregnant      Passed - Valid encounter within last 12 months    Recent Outpatient Visits           5 months ago Mixed diabetic hyperlipidemia associated with type 2 diabetes mellitus ([place])   [facility] [place], Coralie Keens, NP   1 year ago Uncontrolled type 2 diabetes mellitus with hyperglycemia [facility])   [place], Devonne Doughty, DO   1 year ago Encounter to establish care with new doctor   [place]  [place], FNP       Future Appointments             In 4 days MacDiarmid, Nicki Reaper, MD [place]   In 1 week [place], Coralie Keens, NP [facility], [facility]

## 2021-12-25 NOTE — Addendum Note (Signed)
Addended by: Wilson Singer on: 12/25/2021 04:02 PM   Modules accepted: Level of Service

## 2021-12-26 LAB — CULTURE, BLOOD (ROUTINE X 2)
Culture: NO GROWTH
Culture: NO GROWTH
Special Requests: ADEQUATE
Special Requests: ADEQUATE

## 2021-12-29 ENCOUNTER — Encounter: Payer: Self-pay | Admitting: Urology

## 2021-12-29 ENCOUNTER — Ambulatory Visit: Payer: Medicare HMO | Admitting: Urology

## 2022-01-06 ENCOUNTER — Inpatient Hospital Stay: Payer: Medicare HMO | Admitting: Internal Medicine

## 2022-01-06 ENCOUNTER — Encounter: Payer: Self-pay | Admitting: Internal Medicine

## 2022-01-06 ENCOUNTER — Ambulatory Visit (INDEPENDENT_AMBULATORY_CARE_PROVIDER_SITE_OTHER): Payer: Medicare HMO | Admitting: Internal Medicine

## 2022-01-06 ENCOUNTER — Encounter: Payer: Medicare HMO | Admitting: Internal Medicine

## 2022-01-06 ENCOUNTER — Other Ambulatory Visit: Payer: Self-pay

## 2022-01-06 ENCOUNTER — Telehealth: Payer: Self-pay

## 2022-01-06 ENCOUNTER — Ambulatory Visit: Payer: Medicare HMO

## 2022-01-06 VITALS — BP 127/71 | HR 77 | Temp 96.9°F | Wt 216.0 lb

## 2022-01-06 DIAGNOSIS — R6510 Systemic inflammatory response syndrome (SIRS) of non-infectious origin without acute organ dysfunction: Secondary | ICD-10-CM

## 2022-01-06 DIAGNOSIS — Z79899 Other long term (current) drug therapy: Secondary | ICD-10-CM | POA: Diagnosis not present

## 2022-01-06 DIAGNOSIS — J9602 Acute respiratory failure with hypercapnia: Secondary | ICD-10-CM

## 2022-01-06 DIAGNOSIS — J441 Chronic obstructive pulmonary disease with (acute) exacerbation: Secondary | ICD-10-CM | POA: Diagnosis not present

## 2022-01-06 DIAGNOSIS — E1165 Type 2 diabetes mellitus with hyperglycemia: Secondary | ICD-10-CM | POA: Diagnosis not present

## 2022-01-06 DIAGNOSIS — K219 Gastro-esophageal reflux disease without esophagitis: Secondary | ICD-10-CM | POA: Diagnosis not present

## 2022-01-06 DIAGNOSIS — E1142 Type 2 diabetes mellitus with diabetic polyneuropathy: Secondary | ICD-10-CM

## 2022-01-06 DIAGNOSIS — J432 Centrilobular emphysema: Secondary | ICD-10-CM

## 2022-01-06 DIAGNOSIS — Z6837 Body mass index (BMI) 37.0-37.9, adult: Secondary | ICD-10-CM

## 2022-01-06 MED ORDER — OMEPRAZOLE 40 MG PO CPDR: 40.0000 mg | DELAYED_RELEASE_CAPSULE | Freq: Two times a day (BID) | ORAL | 0 refills | Status: DC

## 2022-01-06 NOTE — Patient Instructions (Signed)
Managing the Challenge of Quitting Smoking ?Quitting smoking is a physical and mental challenge. You will face cravings, withdrawal symptoms, and temptation. Before quitting, work with your health care provider to make a plan that can help you manage quitting. Preparation can help you quit and keep you from giving in. ?How to manage lifestyle changes ?Managing stress ?Stress can make you want to smoke, and wanting to smoke may cause stress. It is important to find ways to manage your stress. You might try some of the following: ?Practice relaxation techniques. ?Breathe slowly and deeply, in through your nose and out through your mouth. ?Listen to music. ?Soak in a bath or take a shower. ?Imagine a peaceful place or vacation. ?Get some support. ?Talk with family or friends about your stress. ?Join a support group. ?Talk with a counselor or therapist. ?Get some physical activity. ?Go for a walk, run, or bike ride. ?Play a favorite sport. ?Practice yoga. ? ?Medicines ?Talk with your health care provider about medicines that might help you deal with cravings and make quitting easier for you. ?Relationships ?Social situations can be difficult when you are quitting smoking. To manage this, you can: ?Avoid parties and other social situations where people might be smoking. ?Avoid alcohol. ?Leave right away if you have the urge to smoke. ?Explain to your family and friends that you are quitting smoking. Ask for support and let them know you might be a bit grumpy. ?Plan activities where smoking is not an option. ?General instructions ?Be aware that many people gain weight after they quit smoking. However, not everyone does. To keep from gaining weight, have a plan in place before you quit and stick to the plan after you quit. Your plan should include: ?Having healthy snacks. When you have a craving, it may help to: ?Eat popcorn, carrots, celery, or other cut vegetables. ?Chew sugar-free gum. ?Changing how you eat. ?Eat small  portion sizes at meals. ?Eat 4-6 small meals throughout the day instead of 1-2 large meals a day. ?Be mindful when you eat. Do not watch television or do other things that might distract you as you eat. ?Exercising regularly. ?Make time to exercise each day. If you do not have time for a long workout, do short bouts of exercise for 5-10 minutes several times a day. ?Do some form of strengthening exercise, such as weight lifting. ?Do some exercise that gets your heart beating and causes you to breathe deeply, such as walking fast, running, swimming, or biking. This is very important. ?Drinking plenty of water or other low-calorie or no-calorie drinks. Drink 6-8 glasses of water daily. ? ?How to recognize withdrawal symptoms ?Your body and mind may experience discomfort as you try to get used to not having nicotine in your system. These effects are called withdrawal symptoms. They may include: ?Feeling hungrier than normal. ?Having trouble concentrating. ?Feeling irritable or restless. ?Having trouble sleeping. ?Feeling depressed. ?Craving a cigarette. ?To manage withdrawal symptoms: ?Avoid places, people, and activities that trigger your cravings. ?Remember why you want to quit. ?Get plenty of sleep. ?Avoid coffee and other caffeinated drinks. These may worsen some of your symptoms. ?These symptoms may surprise you. But be assured that they are normal to have when quitting smoking. ?How to manage cravings ?Come up with a plan for how to deal with your cravings. The plan should include the following: ?A definition of the specific situation you want to deal with. ?An alternative action you will take. ?A clear idea for how this action   will help. ?The name of someone who might help you with this. ?Cravings usually last for 5-10 minutes. Consider taking the following actions to help you with your plan to deal with cravings: ?Keep your mouth busy. ?Chew sugar-free gum. ?Suck on hard candies or a straw. ?Brush your  teeth. ?Keep your hands and body busy. ?Change to a different activity right away. ?Squeeze or play with a ball. ?Do an activity or a hobby, such as making bead jewelry, practicing needlepoint, or working with wood. ?Mix up your normal routine. ?Take a short exercise break. Go for a quick walk or run up and down stairs. ?Focus on doing something kind or helpful for someone else. ?Call a friend or family member to talk during a craving. ?Join a support group. ?Contact a quitline. ?Where to find support ?To get help or find a support group: ?Call the National Cancer Institute's Smoking Quitline: 1-800-QUIT NOW (784-8669) ?Visit the website of the Substance Abuse and Mental Health Services Administration: www.samhsa.gov ?Text QUIT to SmokefreeTXT: 478848 ?Where to find more information ?Visit these websites to find more information on quitting smoking: ?National Cancer Institute: www.smokefree.gov ?American Lung Association: www.lung.org ?American Cancer Society: www.cancer.org ?Centers for Disease Control and Prevention: www.cdc.gov ?American Heart Association: www.heart.org ?Contact a health care provider if: ?You want to change your plan for quitting. ?The medicines you are taking are not helping. ?Your eating feels out of control or you cannot sleep. ?Get help right away if: ?You feel depressed or become very anxious. ?Summary ?Quitting smoking is a physical and mental challenge. You will face cravings, withdrawal symptoms, and temptation to smoke again. Preparation can help you as you go through these challenges. ?Try different techniques to manage stress, handle social situations, and prevent weight gain. ?You can deal with cravings by keeping your mouth busy (such as by chewing gum), keeping your hands and body busy, calling family or friends, or contacting a quitline for people who want to quit smoking. ?You can deal with withdrawal symptoms by avoiding places where people smoke, getting plenty of rest, and  avoiding drinks with caffeine. ?This information is not intended to replace advice given to you by your health care provider. Make sure you discuss any questions you have with your health care provider. ?Document Revised: 06/27/2021 Document Reviewed: 08/08/2019 ?Elsevier Patient Education ? 2022 Elsevier Inc. ? ?

## 2022-01-06 NOTE — Assessment & Plan Note (Signed)
Deteriorated ?Encourage weight loss as this can help reduce reflux symptoms ?Increase Omeprazole to 40 mg twice daily ?Consider referral to GI for upper endoscopy if symptoms persist or worsen ?

## 2022-01-06 NOTE — Assessment & Plan Note (Signed)
Uncontrolled ?She follows up with endocrinology tomorrow ?Encouraged low carb diet and exercise for weight loss ?Continue Metformin, Trulicity, Novolog and Lantus ?Encouraged routine eye exams ?Encouraged routine foot exams ?

## 2022-01-06 NOTE — Assessment & Plan Note (Signed)
Discussed the importance of adequate blood sugar control ?Continue Gabapentin ?

## 2022-01-06 NOTE — Progress Notes (Signed)
Subjective:    Patient ID: Rachel Nielsen, female    DOB: Apr 16, 1951, 71 y.o.   MRN: [id number]  HPI  Patient presents to clinic today for TCM hospital follow-up.  She presented to the ER 2/19 with complaint of cough and shortness of breath x2 weeks.  Her chest x-ray was normal.  Her COVID/flu/RSV test was negative.  Blood cultures were negative.  She initially required BiPAP after being diagnosed with COPD exacerbation with acute hypercapnic respiratory failure and SIRS.  She was admitted, treated with bronchodilators, steroids and was eventually weaned to room air.  Of note, her A1c was 9%.  She is currently managed on Lantus, NovoLog, Trulicity and Metformin.  She was discharged on 2/21 with a 5-day course of Augmentin, steroids.  She was advised to continue her Advair, Albuterol and DuoNebs.  Since discharge, she reports she has felt fatigued but this is her baseline.  She denies cough or URI symptoms.  She reports intermittent shortness of breath but reports this is her baseline as well.  She has an appointment with endocrinology tomorrow and pulmonology in 1 week.  She does continue to smoke.  She also reports persistent reflux despite Omeprazole.  She was taking Famotidine in the past but having similar issues.  Review of Systems     Past Medical History:  Diagnosis Date   Allergy    Anxiety    Asthma    Colon polyps    Constipation    COPD (chronic obstructive pulmonary disease) (HCC)    Depression    GERD (gastroesophageal reflux disease)    Glaucoma    Heart attack ([facility])    Hypertension    Sleep apnea    Sleep apnea    Stroke [facility])    Urinary incontinence     Current Outpatient Medications  Medication Sig Dispense Refill   ACCU-CHEK GUIDE test strip Use to check blood sugar up to twice a day as directed 100 strip 3   Accu-Chek Softclix Lancets lancets Use to check blood sugar up to twice a day as directed 100 each 3   albuterol (PROVENTIL) (2.5 MG/3ML) 0.083% nebulizer  solution Inhale 3 mLs (2.5 mg total) into the lungs every 4 (four) hours as needed for wheezing or shortness of breath. 75 mL 12   albuterol (VENTOLIN HFA) 108 (90 Base) MCG/ACT inhaler Inhale 2-4 puffs by mouth every 4 hours as needed for wheezing, cough, and/or shortness of breath 6.7 g 0   ALPRAZolam (XANAX) 1 MG tablet Take 1 mg by mouth 3 (three) times daily as needed.     amLODipine (NORVASC) 5 MG tablet Take 1 tablet (5 mg total) by mouth daily. 90 tablet 1   asenapine (SAPHRIS) 5 MG SUBL 24 hr tablet Place 5 mg under the tongue every evening.     bisoprolol (ZEBETA) 10 MG tablet Take 1 tablet (10 mg total) by mouth daily. OFFICE VISIT NEEDED FOR ADDITIONAL REFILLS 90 tablet 1   buPROPion (WELLBUTRIN XL) 300 MG 24 hr tablet Take 1 tablet (300 mg total) by mouth daily. 30 tablet 0   busPIRone (BUSPAR) 15 MG tablet Take 15 mg by mouth 2 (two) times daily.      DULoxetine (CYMBALTA) 60 MG capsule Take 1 capsule by mouth 2 (two) times daily.     FARXIGA 10 MG TABS tablet Take 10 mg by mouth daily.     fluticasone-salmeterol (ADVAIR) 250-50 MCG/ACT AEPB Inhale 1 puff into the lungs 2 (two) times daily.  gabapentin (NEURONTIN) 600 MG tablet Take 600-900 mg by mouth 3 (three) times daily. 600 mg AM, 600 mg pm and 900 mg at bedtime     GLOBAL EASE INJECT PEN NEEDLES 32G X 4 MM MISC Use as directed with insulin 100 each 0   insulin aspart (NOVOLOG) 100 UNIT/ML injection Inject 12 Units into the skin 3 (three) times daily before meals.     insulin glargine (LANTUS) 100 UNIT/ML injection Inject 0.36 mLs (36 Units total) into the skin daily. (Patient taking differently: Inject 46 Units into the skin at bedtime.) 10 mL 2   Insulin Pen Needle (BD PEN NEEDLE NANO U/F) 32G X 4 MM MISC Inject 1 Device into the skin daily. 100 each 3   Insulin Syringe-Needle U-100 (INSULIN SYRINGE 1CC/31GX5/16") 31G X 5/16" 1 ML MISC 1 Syringe by Does not apply route in the morning, at noon, in the evening, and at bedtime.  200 each 3   ipratropium-albuterol (DUONEB) 0.5-2.5 (3) MG/3ML SOLN Take 3 mLs by nebulization every 4 (four) hours as needed. 360 mL 0   LATUDA 40 MG TABS tablet Take 40 mg by mouth at bedtime.     linaclotide (LINZESS) 290 MCG CAPS capsule Take 1 capsule (290 mcg total) by mouth daily before breakfast. 30 capsule 5   metFORMIN (GLUCOPHAGE) 500 MG tablet Take 1 tablet by mouth 2 (two) times daily.     mirabegron ER (MYRBETRIQ) 50 MG TB24 tablet Take 1 tablet (50 mg total) by mouth daily. 30 tablet 0   montelukast (SINGULAIR) 10 MG tablet Take 1 tablet (10 mg total) by mouth daily. 30 tablet 0   Olopatadine HCl 0.2 % SOLN Place 1 drop into both eyes daily. 2.5 mL 0   omeprazole (PRILOSEC) 40 MG capsule Take 1 capsule (40 mg total) by mouth daily. 90 capsule 0   rosuvastatin (CRESTOR) 10 MG tablet Take 1 tablet (10 mg total) by mouth daily. 90 tablet 0   traMADol (ULTRAM) 50 MG tablet Take 1 tablet by mouth 2 (two) times daily.     TRAVATAN Z 0.004 % SOLN ophthalmic solution Place 1 drop into both eyes at bedtime. 5 mL 0   No current facility-administered medications for this visit.    Allergies  Allergen Reactions   Ace Inhibitors Cough    Family History  Problem Relation Age of Onset   Hypertension Mother    Atrial fibrillation Mother    Heart attack Mother    Hypertension Father    Stomach cancer Father    Diabetes Father    Liver cancer Father    Diabetes Sister    Hypertension Sister     Social History   Socioeconomic History   Marital status: Legally Separated    Spouse name: Not on file   Number of children: 4   Years of education: Not on file   Highest education level: Not on file  Occupational History   Occupation: retired  Tobacco Use   Smoking status: Former   Smokeless tobacco: Never   Tobacco comments:    4 months ago  Scientific laboratory technician Use: Some days   Substances: Nicotine, Flavoring  Substance and Sexual Activity   Alcohol use: Not Currently     Comment: monthly or less    Drug use: No   Sexual activity: Not on file  Other Topics Concern   Not on file  Social History Narrative   Not on file   Social Determinants of Health  Financial Resource Strain: Low Risk    Difficulty of Paying Living Expenses: Not hard at all  Food Insecurity: No Food Insecurity   Worried About Charity fundraiser in the Last Year: Never true   Ran Out of Food in the Last Year: Never true  Transportation Needs: No Transportation Needs   Lack of Transportation (Medical): No   Lack of Transportation (Non-Medical): No  Physical Activity: Not on file  Stress: Not on file  Social Connections: Socially Isolated   Frequency of Communication with Friends and Family: More than three times a week   Frequency of Social Gatherings with Friends and Family: More than three times a week   Attends Religious Services: Never   Marine scientist or Organizations: No   Attends Archivist Meetings: Never   Marital Status: Separated  Intimate Partner Violence: Not on file     Constitutional: Patient reports fatigue.  Denies fever, malaise, headache or abrupt weight changes.  HEENT: Denies eye pain, eye redness, ear pain, ringing in the ears, wax buildup, runny nose, nasal congestion, bloody nose, or sore throat. Respiratory: Patient reports intermittent shortness of breath.  Denies difficulty breathing, cough or sputum production.   Cardiovascular: Denies chest pain, chest tightness, palpitations or swelling in the hands or feet.  Gastrointestinal: Patient reports reflux.  Denies abdominal pain, bloating, constipation, diarrhea or blood in the stool.  Musculoskeletal: Patient reports difficulty with gait.  Denies decrease in range of motion, muscle pain or joint pain and swelling.  Skin: Denies redness, rashes, lesions or ulcercations.  Neurological: Patient reports neuropathic pain in hands and feet.  Denies dizziness, difficulty with memory, difficulty  with speech or problems with balance and coordination.    No other specific complaints in a complete review of systems (except as listed in HPI above).  Objective:   Physical Exam  BP 127/71 (BP Location: Right Arm, Patient Position: Sitting, Cuff Size: Large)    Pulse 77    Temp (!) 96.9 F (36.1 C) (Temporal)    Wt 216 lb (98 kg)    SpO2 100%    BMI 37.08 kg/m   Wt Readings from Last 3 Encounters:  12/21/21 225 lb (102.1 kg)  09/15/21 217 lb (98.4 kg)  07/09/21 223 lb 6.4 oz (101.3 kg)    General: Appears her stated age, obese, chronically ill-appearing, in NAD. Skin: Warm, dry and intact. No  ulcerations noted. HEENT: Head: normal shape and size; Eyes: sclera white and EOMs intact;  Cardiovascular: Normal rate and rhythm. S1,S2 noted.  No murmur, rubs or gallops noted. No JVD or BLE edema.  Pulmonary/Chest: Normal effort and positive vesicular breath sounds. No respiratory distress. No wheezes, rales or ronchi noted.  Abdomen: Soft and nontender. Normal bowel sounds.  Musculoskeletal: She has difficulty getting from a sitting to a standing position.  Gait slow and steady with use of walker. Neurological: Alert and oriented.    BMET    Component Value Date/Time   NA 140 12/22/2021 0441   NA 141 10/19/2019 1323   K 4.2 12/22/2021 0441   CL 103 12/22/2021 0441   CO2 30 12/22/2021 0441   GLUCOSE 235 (H) 12/22/2021 0441   BUN 20 12/22/2021 0441   BUN 19 10/19/2019 1323   CREATININE 0.81 12/22/2021 0441   CALCIUM 8.8 (L) 12/22/2021 0441   GFRNONAA >60 12/22/2021 0441   GFRAA 59 (L) 08/03/2020 0637    Lipid Panel  No results found for: CHOL, TRIG, HDL, CHOLHDL,  VLDL, LDLCALC  CBC    Component Value Date/Time   WBC 13.7 (H) 12/23/2021 0324   RBC 4.87 12/23/2021 0324   HGB 12.4 12/23/2021 0324   HCT 40.7 12/23/2021 0324   PLT 183 12/23/2021 0324   MCV 83.6 12/23/2021 0324   MCH 25.5 (L) 12/23/2021 0324   MCHC 30.5 12/23/2021 0324   RDW 16.1 (H) 12/23/2021 0324    LYMPHSABS 2.6 12/21/2021 0833   MONOABS 0.9 12/21/2021 0833   EOSABS 0.8 (H) 12/21/2021 0833   BASOSABS 0.1 12/21/2021 0833    Hgb A1C Lab Results  Component Value Date   HGBA1C 9.0 (H) 12/21/2021           Assessment & Plan:   [facility] follow-up for COPD exacerbation with Acute Hypercapnic Respiratory Failure, SIRS and Uncontrolled DM2 with Neuropathy:  Hospital notes, labs and imaging reviewed Encourage smoking cessation Encourage low-carb diet and exercise for weight loss Continue current meds as prescribed She will keep follow-up appointments with endocrinology and pulmonology as previously scheduled No indication for repeat labs at this time  Schedule an appointment for your annual exam in 1 month Webb Silversmith, NP This visit occurred during the SARS-CoV-2 public health emergency.  Safety protocols were in place, including screening questions prior to the visit, additional usage of staff PPE, and extensive cleaning of exam room while observing appropriate contact time as indicated for disinfecting solutions.

## 2022-01-06 NOTE — Assessment & Plan Note (Signed)
Encourage smoking cessation to reduce progression of her emphysema ?Continue Advair, Albuterol and DuoNebs ?

## 2022-01-06 NOTE — Chronic Care Management (AMB) (Signed)
?  Chronic Care Management  ? ?Note ? ?01/06/2022 ?Name: Rachel Nielsen MRN: [id number] DOB: 10-Jun-1951 ? ?Rachel Nielsen is a 71 y.o. year old female who is a primary care patient of Jearld Fenton, NP. I reached out to [name] by phone today in response to a referral sent by [name] PCP. ? ?Rachel Nielsen was given information about Chronic Care Management services today including:  ?CCM service includes personalized support from designated clinical staff supervised by her physician, including individualized plan of care and coordination with other care providers ?24/7 contact phone numbers for assistance for urgent and routine care needs. ?Service will only be billed when office clinical staff spend 20 minutes or more in a month to coordinate care. ?Only one practitioner may furnish and bill the service in a calendar month. ?The patient may stop CCM services at any time (effective at the end of the month) by phone call to the office staff. ?The patient is responsible for co-pay (up to 20% after annual deductible is met) if co-pay is required by the individual health plan.  ? ?Patient agreed to services and verbal consent obtained.  ? ?Follow up plan: ?Telephone appointment with care management team member scheduled for: ?RN CM 01/12/2022 ?LCSW 01/19/2022 ? ?Noreene Larsson, RMA ?Care Guide, Embedded Care Coordination ?[facility]  ?[place], [place] [id number] ?Direct Dial: [phone] ?[email or web address] ?Website: [email or web address]  ? ?

## 2022-01-06 NOTE — Assessment & Plan Note (Signed)
Encouraged diet and exercise for weight loss ?

## 2022-01-07 DIAGNOSIS — Z794 Long term (current) use of insulin: Secondary | ICD-10-CM | POA: Diagnosis not present

## 2022-01-07 DIAGNOSIS — E1165 Type 2 diabetes mellitus with hyperglycemia: Secondary | ICD-10-CM | POA: Diagnosis not present

## 2022-01-07 DIAGNOSIS — E1142 Type 2 diabetes mellitus with diabetic polyneuropathy: Secondary | ICD-10-CM | POA: Diagnosis not present

## 2022-01-07 DIAGNOSIS — E1169 Type 2 diabetes mellitus with other specified complication: Secondary | ICD-10-CM | POA: Diagnosis not present

## 2022-01-07 DIAGNOSIS — E669 Obesity, unspecified: Secondary | ICD-10-CM | POA: Diagnosis not present

## 2022-01-09 ENCOUNTER — Ambulatory Visit: Payer: Medicare HMO

## 2022-01-12 ENCOUNTER — Ambulatory Visit (INDEPENDENT_AMBULATORY_CARE_PROVIDER_SITE_OTHER): Payer: Medicare HMO

## 2022-01-12 ENCOUNTER — Telehealth: Payer: Medicare HMO

## 2022-01-12 ENCOUNTER — Other Ambulatory Visit: Payer: Self-pay | Admitting: Internal Medicine

## 2022-01-12 DIAGNOSIS — F411 Generalized anxiety disorder: Secondary | ICD-10-CM

## 2022-01-12 DIAGNOSIS — I10 Essential (primary) hypertension: Secondary | ICD-10-CM

## 2022-01-12 DIAGNOSIS — E782 Mixed hyperlipidemia: Secondary | ICD-10-CM

## 2022-01-12 DIAGNOSIS — E1165 Type 2 diabetes mellitus with hyperglycemia: Secondary | ICD-10-CM

## 2022-01-12 DIAGNOSIS — E1169 Type 2 diabetes mellitus with other specified complication: Secondary | ICD-10-CM

## 2022-01-12 DIAGNOSIS — M5412 Radiculopathy, cervical region: Secondary | ICD-10-CM

## 2022-01-12 DIAGNOSIS — F331 Major depressive disorder, recurrent, moderate: Secondary | ICD-10-CM

## 2022-01-12 DIAGNOSIS — J432 Centrilobular emphysema: Secondary | ICD-10-CM

## 2022-01-12 DIAGNOSIS — J441 Chronic obstructive pulmonary disease with (acute) exacerbation: Secondary | ICD-10-CM

## 2022-01-12 DIAGNOSIS — Z9181 History of falling: Secondary | ICD-10-CM

## 2022-01-12 NOTE — Chronic Care Management (AMB) (Cosign Needed)
Chronic Care Management   CCM RN Visit Note  01/12/2022 Name: Rachel Nielsen MRN: [id number] DOB: 02-15-1951  Subjective: DONNEISHA Nielsen is a 71 y.o. year old female who is a primary care patient of Jearld Fenton, NP. The care management team was consulted for assistance with disease management and care coordination needs.    Engaged with patient by telephone for initial visit in response to provider referral for case management and/or care coordination services.   Consent to Services:  The patient was given the following information about Chronic Care Management services today, agreed to services, and gave verbal consent: 1. CCM service includes personalized support from designated clinical staff supervised by the primary care provider, including individualized plan of care and coordination with other care providers 2. 24/7 contact phone numbers for assistance for urgent and routine care needs. 3. Service will only be billed when office clinical staff spend 20 minutes or more in a month to coordinate care. 4. Only one practitioner may furnish and bill the service in a calendar month. 5.The patient may stop CCM services at any time (effective at the end of the month) by phone call to the office staff. 6. The patient will be responsible for cost sharing (co-pay) of up to 20% of the service fee (after annual deductible is met). Patient agreed to services and consent obtained.  Patient agreed to services and verbal consent obtained.   Assessment: Review of patient past medical history, allergies, medications, health status, including review of consultants reports, laboratory and other test data, was performed as part of comprehensive evaluation and provision of chronic care management services.   SDOH (Social Determinants of Health) assessments and interventions performed:  SDOH Interventions    Flowsheet Row Most Recent Value  SDOH Interventions   Food Insecurity Interventions Intervention Not  Indicated  Financial Strain Interventions Intervention Not Indicated  Housing Interventions Intervention Not Indicated  Intimate Partner Violence Interventions Intervention Not Indicated  Physical Activity Interventions Other (Comments)  [has an exercise bike but has not been using it]  Stress Interventions Intervention Not Indicated  Social Connections Interventions Other (Comment)  [has good support system. 4 children]  Transportation Interventions Intervention Not Indicated        CCM Care Plan  Allergies  Allergen Reactions   Ace Inhibitors Cough    Outpatient Encounter Medications as of 01/12/2022  Medication Sig   ACCU-CHEK GUIDE test strip Use to check blood sugar up to twice a day as directed   Accu-Chek Softclix Lancets lancets Use to check blood sugar up to twice a day as directed   albuterol (PROVENTIL) (2.5 MG/3ML) 0.083% nebulizer solution Inhale 3 mLs (2.5 mg total) into the lungs every 4 (four) hours as needed for wheezing or shortness of breath.   albuterol (VENTOLIN HFA) 108 (90 Base) MCG/ACT inhaler Inhale 2-4 puffs by mouth every 4 hours as needed for wheezing, cough, and/or shortness of breath   ALPRAZolam (XANAX) 1 MG tablet Take 1 mg by mouth 3 (three) times daily as needed.   amLODipine (NORVASC) 5 MG tablet Take 1 tablet (5 mg total) by mouth daily.   asenapine (SAPHRIS) 5 MG SUBL 24 hr tablet Place 5 mg under the tongue every evening.   bisoprolol (ZEBETA) 10 MG tablet Take 1 tablet (10 mg total) by mouth daily. OFFICE VISIT NEEDED FOR ADDITIONAL REFILLS   buPROPion (WELLBUTRIN XL) 300 MG 24 hr tablet Take 1 tablet (300 mg total) by mouth daily.   busPIRone (BUSPAR) 15  MG tablet Take 15 mg by mouth 2 (two) times daily.    FARXIGA 10 MG TABS tablet Take 10 mg by mouth daily.   fluticasone-salmeterol (ADVAIR) 250-50 MCG/ACT AEPB Inhale 1 puff into the lungs 2 (two) times daily.   gabapentin (NEURONTIN) 600 MG tablet Take 600-900 mg by mouth 3 (three) times  daily. 600 mg AM, 600 mg pm and 900 mg at bedtime   GLOBAL EASE INJECT PEN NEEDLES 32G X 4 MM MISC Use as directed with insulin   insulin aspart (NOVOLOG) 100 UNIT/ML injection Inject 12 Units into the skin 3 (three) times daily before meals.   insulin glargine (LANTUS) 100 UNIT/ML injection Inject 0.36 mLs (36 Units total) into the skin daily. (Patient taking differently: Inject 46 Units into the skin at bedtime.)   Insulin Pen Needle (BD PEN NEEDLE NANO U/F) 32G X 4 MM MISC Inject 1 Device into the skin daily.   Insulin Syringe-Needle U-100 (INSULIN SYRINGE 1CC/31GX5/16") 31G X 5/16" 1 ML MISC 1 Syringe by Does not apply route in the morning, at noon, in the evening, and at bedtime.   ipratropium-albuterol (DUONEB) 0.5-2.5 (3) MG/3ML SOLN Take 3 mLs by nebulization every 4 (four) hours as needed.   LATUDA 40 MG TABS tablet Take 40 mg by mouth at bedtime.   linaclotide (LINZESS) 290 MCG CAPS capsule Take 1 capsule (290 mcg total) by mouth daily before breakfast.   metFORMIN (GLUCOPHAGE) 500 MG tablet Take 1 tablet by mouth 2 (two) times daily.   mirabegron ER (MYRBETRIQ) 50 MG TB24 tablet Take 1 tablet (50 mg total) by mouth daily.   montelukast (SINGULAIR) 10 MG tablet Take 1 tablet (10 mg total) by mouth daily.   Olopatadine HCl 0.2 % SOLN Place 1 drop into both eyes daily.   omeprazole (PRILOSEC) 40 MG capsule Take 1 capsule (40 mg total) by mouth in the morning and at bedtime.   rosuvastatin (CRESTOR) 10 MG tablet Take 1 tablet (10 mg total) by mouth daily.   traMADol (ULTRAM) 50 MG tablet Take 1 tablet by mouth 2 (two) times daily.   TRAVATAN Z 0.004 % SOLN ophthalmic solution Place 1 drop into both eyes at bedtime.   No facility-administered encounter medications on file as of 01/12/2022.    Patient Active Problem List   Diagnosis Date Noted   Mixed diabetic hyperlipidemia associated with type 2 diabetes mellitus ([place]) 07/09/2021   Chronic constipation 07/09/2021   Cervical  radiculopathy 07/09/2021   Lumbar radiculopathy 07/09/2021   Class 2 obesity due to excess calories with body mass index (BMI) of 37.0 to 37.9 in adult 07/09/2021   OSA (obstructive sleep apnea) 12/18/2020   Diabetic neuropathy ([place]) 12/17/2020   Centrilobular emphysema ([place]) 12/17/2020   GAD (generalized anxiety disorder) 12/17/2020   History of stroke 10/21/2020   Uncontrolled type 2 diabetes mellitus with hyperglycemia ([place]) 03/14/2019   Hypertension 12/21/2017   GERD (gastroesophageal reflux disease) 12/21/2017   Major depressive disorder, recurrent, moderate ([place]) 12/21/2017    Conditions to be addressed/monitored:HTN, HLD, COPD, DMII, Anxiety, Depression, and Chronic pain and falls  Care Plan : RNCM: General Plan of Care (Adult) for Chronic Disease Management and Care Coordination Needs  Updates made by Vanita Ingles, RN since 01/12/2022 12:00 AM     Problem: RNCM: Development of Plan of Care for Chronic Disease Management (HTN, HLD, DM, COPD, Depression, Anxiety, Chronic Pain and Falls)   Priority: High     Long-Range Goal: RNCM: Effective Management  of Plan  of Care for Chronic Disease Management (HTN, HLD, DM, COPD, Depression, Anxiety, Chronic Pain and Falls)   Start Date: 01/12/2022  Expected End Date: 01/13/2023  Priority: High  Note:   Current Barriers:  Knowledge Deficits related to plan of care for management of HTN, HLD, COPD, DMII, Chronic Pain, and Depression, Anxiety, Falls prevention  Care Coordination needs related to Mental Health Concerns   Chronic Disease Management support and education needs related to HTN, HLD, COPD, DMII, Chronic Pain, and Depression, Anxiety, Falls prevention   RNCM Clinical Goal(s):  Patient will verbalize basic understanding of HTN, HLD, COPD, DMII, Anxiety, Depression, and Chronic pain and falls disease process and self health management plan as evidenced by routine office visits, lab testing, VS stable, compliance with the plan of  care and dietary restrictions, and working with the CCM team to effectively manage health and well being.  demonstrate understanding of rationale for each prescribed medication as evidenced by compliance with medications and calling for refills before running out of medications     attend all scheduled medical appointments: 02-10-2022 at 11 am as evidenced by keeping appointments and calling for schedule change needs         demonstrate improved and ongoing health management independence as evidenced by no exacerbations in chronic conditions, stable VS and labs, and effective management of chronic conditions        demonstrate ongoing self health care management ability effectively manage Chronic conditions  as evidenced by  working with the CCM team through collaboration with Consulting civil engineer, provider, and care team.   Interventions: 1:1 collaboration with primary care provider regarding development and update of comprehensive plan of care as evidenced by provider attestation and co-signature Inter-disciplinary care team collaboration (see longitudinal plan of care) Evaluation of current treatment plan related to  self management and patient's adherence to plan as established by provider   COPD: (Status: New goal. Goal on Track (progressing): YES.) Long Term Goal  Reviewed medications with patient, including use of prescribed maintenance and rescue inhalers, and provided instruction on medication management and the importance of adherence Provided patient with basic written and verbal COPD education on self care/management/and exacerbation prevention Advised patient to track and manage COPD triggers Provided written and verbal instructions on pursed lip breathing and utilized returned demonstration as teach back Provided instruction about proper use of medications used for management of COPD including inhalers Advised patient to self assesses COPD action plan zone and make appointment with provider  if in the yellow zone for 48 hours without improvement Advised patient to engage in light exercise as tolerated 3-5 days a week to aid in the the management of COPD Provided education about and advised patient to utilize infection prevention strategies to reduce risk of respiratory infection Discussed the importance of adequate rest and management of fatigue with COPD  Diabetes:  (Status: New goal. Goal on Track (progressing): YES.) Long Term Goal   Lab Results  Component Value Date   HGBA1C 9.0 (H) 12/21/2021    Assessed patient's understanding of A1c goal: <7% Provided education to patient about basic DM disease process; Reviewed medications with patient and discussed importance of medication adherence. 01-12-2022: The patient with recent changes in her medications with the endocrinologist. The patient states she has a good understanding of how to take her medications. Education and support given.         Reviewed prescribed diet with patient heart healthy/ADA diet. 01-12-2022: Review of  heart healthy/ADA diet. Will send  information by email and mail on healthy eating ; Counseled on importance of regular laboratory monitoring as prescribed. 01-12-2022: Review of monitoring for A1C levels on a consistent basis. ;        Discussed plans with patient for ongoing care management follow up and provided patient with direct contact information for care management team;      Provided patient with written educational materials related to hypo and hyperglycemia and importance of correct treatment. 01-12-2022: The patient denies any lows but states her blood sugars have been in the 200's, they were in the 200's but endocrinologist made new changes recently.        Reviewed scheduled/upcoming provider appointments including: 02-10-2022 at 11 am;         Advised patient, providing education and rationale, to check cbg before meals and at bedtime and record. 01-12-2022: The patient states that she takes her blood  sugars BID. She states that they are doing better and states that her average is about 200. Review of goal of blood sugars fasting is <130 and post prandial is <180.     call provider for findings outside established parameters;       Referral made to pharmacy team for assistance with medications management and reconciliation. The patient reminded of upcoming appointment with pharm D on 01-19-2022;       Review of patient status, including review of consultants reports, relevant laboratory and other test results, and medications completed;       Advised patient to discuss changes in DM plan of care, new questions or concerns with provider;       Falls:  (Status: New goal. Goal on Track (progressing): YES.) Long Term Goal  Provided written and verbal education re: potential causes of falls and Fall prevention strategies Reviewed medications and discussed potential side effects of medications such as dizziness and frequent urination Advised patient of importance of notifying provider of falls. 01-12-2022: Advised the patient to let the office know of any new falls.  Assessed for signs and symptoms of orthostatic hypotension. 01-12-2022: The patient denies any issues with orthostatic hypotension  Assessed for falls since last encounter. 01-12-2022: The patient states that she loses her balance a lot. Last known fall was 12-04-2021. The patient uses a cane and also has a walker. Review of safety and fall precautions.  Assessed patients knowledge of fall risk prevention secondary to previously provided education Provided patient information for fall alert systems Assessed working status of life alert bracelet and patient adherence Advised patient to discuss new falls and safety concerns with provider  Anxiety and Depression   (Status: New goal. Goal on Track (progressing): YES.) Long Term Goal  Evaluation of current treatment plan related to Anxiety and Depression, Mental Health Concerns  self-management and  patient's adherence to plan as established by provider. Discussed plans with patient for ongoing care management follow up and provided patient with direct contact information for care management team Advised patient to call the office for changes in mood, anxiety, depression, or new mental health concerns; Provided education to patient re: support from CCM team and LCSW if needed, mindfulness, and helped the patient reset her my Chart password as the patient has been unable to get into her my Chart; Reviewed medications with patient and discussed compliance, the patient is weaning down off of her Cymbalta and is doing well at this time with the changes; Provided patient with mindfulness and stress management  educational materials related to anxiety and depression ;  Reviewed scheduled/upcoming provider appointments including 02-10-2022 at 11 am; Discussed plans with patient for ongoing care management follow up and provided patient with direct contact information for care management team; Advised patient to discuss mental health concerns, changes with anxiety and depression  with provider; Screening for signs and symptoms of depression related to chronic disease state;  Assessed social determinant of health barriers;   Hyperlipidemia:  (Status: New goal. Goal on Track (progressing): YES.) Long Term Goal  No results found for: CHOL, HDL, LDLCALC, LDLDIRECT, TRIG, CHOLHDL   Medication review performed; medication list updated in electronic medical record.  Provider established cholesterol goals reviewed; Counseled on importance of regular laboratory monitoring as prescribed; Provided HLD educational materials; Reviewed role and benefits of statin for ASCVD risk reduction; Discussed strategies to manage statin-induced myalgias; Reviewed importance of limiting foods high in cholesterol; Reviewed exercise goals and target of 150 minutes per week;  Hypertension: (Status: New goal. Goal on Track  (progressing): YES.) Long Term Goal  Last practice recorded BP readings:  BP Readings from Last 3 Encounters:  01/06/22 127/71  12/23/21 (!) 154/91  11/10/21 (!) 146/83  Most recent eGFR/CrCl: No results found for: EGFR  No components found for: CRCL  Evaluation of current treatment plan related to hypertension self management and patient's adherence to plan as established by provider;   Provided education to patient re: stroke prevention, s/s of heart attack and stroke; Reviewed prescribed diet heart healthy/ADA diet  Reviewed medications with patient and discussed importance of compliance;  Discussed plans with patient for ongoing care management follow up and provided patient with direct contact information for care management team; Advised patient, providing education and rationale, to monitor blood pressure daily and record, calling PCP for findings outside established parameters;  Advised patient to discuss blood pressure changes and trends  with provider; Provided education on prescribed diet heart healthy/ADA diet ;  Discussed complications of poorly controlled blood pressure such as heart disease, stroke, circulatory complications, vision complications, kidney impairment, sexual dysfunction;   Pain:  (Status: New goal. Goal on Track (progressing): YES.) Long Term Goal  Pain assessment performed. 01-12-2022: The patient rates her pain in her neck at an 8 today and says this is good for her. She also has neuropathy pain in her feet and hands. She denies any acute findings.  Medications reviewed. 01-12-2022: States it is time for her to get more shots in her neck to help with the pain Reviewed provider established plan for pain management; Discussed importance of adherence to all scheduled medical appointments; Counseled on the importance of reporting any/all new or changed pain symptoms or management strategies to pain management provider; Advised patient to report to care team affect of  pain on daily activities; Discussed use of relaxation techniques and/or diversional activities to assist with pain reduction (distraction, imagery, relaxation, massage, acupressure, TENS, heat, and cold application; Reviewed with patient prescribed pharmacological and nonpharmacological pain relief strategies; Advised patient to discuss unresolved pain, changes in level or intensity of pain  with provider;  Patient Goals/Self-Care Activities: Take medications as prescribed   Attend all scheduled provider appointments Call pharmacy for medication refills 3-7 days in advance of running out of medications Attend church or other social activities Perform all self care activities independently  Perform IADL's (shopping, preparing meals, housekeeping, managing finances) independently Call provider office for new concerns or questions  Work with the social worker to address care coordination needs and will continue to work with the clinical team to address health  care and disease management related needs call the Suicide and Crisis Lifeline: 988 call the [place] National Suicide Prevention Lifeline: (404) 675-7819 or TTY: 980-855-6485 TTY 949-753-7939) to talk to a trained counselor call 1-800-273-TALK (toll free, 24 hour hotline) if experiencing a Mental Health or [place]  keep appointment with eye doctor check blood sugar at prescribed times: twice daily, when you have symptoms of low or high blood sugar, and before and after exercise check feet daily for cuts, sores or redness enter blood sugar readings and medication or insulin into daily log take the blood sugar log to all doctor visits trim toenails straight across drink 6 to 8 glasses of water each day eat fish at least once per week fill half of plate with vegetables limit fast food meals to no more than 1 per week manage portion size prepare main meal at home 3 to 5 days each week read food labels for fat, fiber,  carbohydrates and portion size keep feet up while sitting wash and dry feet carefully every day wear comfortable, cotton socks wear comfortable, well-fitting shoes avoid second hand smoke eliminate smoking in my home identify and avoid work-related triggers identify and remove indoor air pollutants limit outdoor activity during cold weather listen for public air quality announcements every day do breathing exercises every day arrange respite care for caregiver attend pulmonary rehabilitation begin a symptom diary develop a rescue plan eliminate symptom triggers at home follow rescue plan if symptoms flare-up use an extra pillow to sleep develop a new routine to improve sleep don't eat or exercise right before bedtime get at least 7 to 8 hours of sleep at night use devices that will help like a cane, sock-puller or reacher do exercises in a comfortable position that makes breathing as easy as possible check blood pressure weekly choose a place to take my blood pressure (home, clinic or office, retail store) write blood pressure results in a log or diary learn about high blood pressure keep a blood pressure log take blood pressure log to all doctor appointments call doctor for signs and symptoms of high blood pressure develop an action plan for high blood pressure keep all doctor appointments take medications for blood pressure exactly as prescribed report new symptoms to your doctor eat more whole grains, fruits and vegetables, lean meats and healthy fats - call for medicine refill 2 or 3 days before it runs out - take all medications exactly as prescribed - call doctor with any symptoms you believe are related to your medicine - call doctor when you experience any new symptoms - go to all doctor appointments as scheduled - adhere to prescribed diet: Heart Healthy/ADA diet       Plan:Telephone follow up appointment with care management team member scheduled for:  02-02-2022  at 230 pm  [place], MSN, [place] Mobile: [phone]

## 2022-01-12 NOTE — Patient Instructions (Signed)
Visit Information   Thank you for taking time to visit with me today. Please don't hesitate to contact me if I can be of assistance to you before our next scheduled telephone appointment.    Our next appointment is by telephone on 02-02-2022 at 230 pm  Please call the care guide team at [phone] if you need to cancel or reschedule your appointment.   If you are experiencing a Mental Health or [place] or need someone to talk to, please call the Suicide and Crisis Lifeline: 988 call the [place] National Suicide Prevention Lifeline: 762-552-8310 or TTY: (346) 646-9586 TTY 435-514-5580) to talk to a trained counselor call 1-800-273-TALK (toll free, 24 hour hotline)   Following is a copy of your full care plan:  Care Plan : RNCM: General Plan of Care (Adult) for Chronic Disease Management and Care Coordination Needs  Updates made by Vanita Ingles, RN since 01/12/2022 12:00 AM     Problem: RNCM: Development of Plan of Care for Chronic Disease Management (HTN, HLD, DM, COPD, Depression, Anxiety, Chronic Pain and Falls)   Priority: High     Long-Range Goal: RNCM: Effective Management  of Plan of Care for Chronic Disease Management (HTN, HLD, DM, COPD, Depression, Anxiety, Chronic Pain and Falls)   Start Date: 01/12/2022  Expected End Date: 01/13/2023  Priority: High  Note:   Current Barriers:  Knowledge Deficits related to plan of care for management of HTN, HLD, COPD, DMII, Chronic Pain, and Depression, Anxiety, Falls prevention  Care Coordination needs related to Mental Health Concerns   Chronic Disease Management support and education needs related to HTN, HLD, COPD, DMII, Chronic Pain, and Depression, Anxiety, Falls prevention   RNCM Clinical Goal(s):  Patient will verbalize basic understanding of HTN, HLD, COPD, DMII, Anxiety, Depression, and Chronic pain and falls disease process and self health management plan as evidenced by routine office visits, lab testing, VS  stable, compliance with the plan of care and dietary restrictions, and working with the CCM team to effectively manage health and well being.  demonstrate understanding of rationale for each prescribed medication as evidenced by compliance with medications and calling for refills before running out of medications     attend all scheduled medical appointments: 02-10-2022 at 11 am as evidenced by keeping appointments and calling for schedule change needs         demonstrate improved and ongoing health management independence as evidenced by no exacerbations in chronic conditions, stable VS and labs, and effective management of chronic conditions        demonstrate ongoing self health care management ability effectively manage Chronic conditions  as evidenced by  working with the CCM team through collaboration with Consulting civil engineer, provider, and care team.   Interventions: 1:1 collaboration with primary care provider regarding development and update of comprehensive plan of care as evidenced by provider attestation and co-signature Inter-disciplinary care team collaboration (see longitudinal plan of care) Evaluation of current treatment plan related to  self management and patient's adherence to plan as established by provider   COPD: (Status: New goal. Goal on Track (progressing): YES.) Long Term Goal  Reviewed medications with patient, including use of prescribed maintenance and rescue inhalers, and provided instruction on medication management and the importance of adherence Provided patient with basic written and verbal COPD education on self care/management/and exacerbation prevention Advised patient to track and manage COPD triggers Provided written and verbal instructions on pursed lip breathing and utilized returned demonstration as teach back  Provided instruction about proper use of medications used for management of COPD including inhalers Advised patient to self assesses COPD action plan  zone and make appointment with provider if in the yellow zone for 48 hours without improvement Advised patient to engage in light exercise as tolerated 3-5 days a week to aid in the the management of COPD Provided education about and advised patient to utilize infection prevention strategies to reduce risk of respiratory infection Discussed the importance of adequate rest and management of fatigue with COPD  Diabetes:  (Status: New goal. Goal on Track (progressing): YES.) Long Term Goal   Lab Results  Component Value Date   HGBA1C 9.0 (H) 12/21/2021    Assessed patient's understanding of A1c goal: <7% Provided education to patient about basic DM disease process; Reviewed medications with patient and discussed importance of medication adherence. 01-12-2022: The patient with recent changes in her medications with the endocrinologist. The patient states she has a good understanding of how to take her medications. Education and support given.         Reviewed prescribed diet with patient heart healthy/ADA diet. 01-12-2022: Review of  heart healthy/ADA diet. Will send information by email and mail on healthy eating ; Counseled on importance of regular laboratory monitoring as prescribed. 01-12-2022: Review of monitoring for A1C levels on a consistent basis. ;        Discussed plans with patient for ongoing care management follow up and provided patient with direct contact information for care management team;      Provided patient with written educational materials related to hypo and hyperglycemia and importance of correct treatment. 01-12-2022: The patient denies any lows but states her blood sugars have been in the 200's, they were in the 200's but endocrinologist made new changes recently.        Reviewed scheduled/upcoming provider appointments including: 02-10-2022 at 11 am;         Advised patient, providing education and rationale, to check cbg before meals and at bedtime and record. 01-12-2022: The  patient states that she takes her blood sugars BID. She states that they are doing better and states that her average is about 200. Review of goal of blood sugars fasting is <130 and post prandial is <180.     call provider for findings outside established parameters;       Referral made to pharmacy team for assistance with medications management and reconciliation. The patient reminded of upcoming appointment with pharm D on 01-19-2022;       Review of patient status, including review of consultants reports, relevant laboratory and other test results, and medications completed;       Advised patient to discuss changes in DM plan of care, new questions or concerns with provider;       Falls:  (Status: New goal. Goal on Track (progressing): YES.) Long Term Goal  Provided written and verbal education re: potential causes of falls and Fall prevention strategies Reviewed medications and discussed potential side effects of medications such as dizziness and frequent urination Advised patient of importance of notifying provider of falls. 01-12-2022: Advised the patient to let the office know of any new falls.  Assessed for signs and symptoms of orthostatic hypotension. 01-12-2022: The patient denies any issues with orthostatic hypotension  Assessed for falls since last encounter. 01-12-2022: The patient states that she loses her balance a lot. Last known fall was 12-04-2021. The patient uses a cane and also has a walker. Review of safety and  fall precautions.  Assessed patients knowledge of fall risk prevention secondary to previously provided education Provided patient information for fall alert systems Assessed working status of life alert bracelet and patient adherence Advised patient to discuss new falls and safety concerns with provider  Anxiety and Depression   (Status: New goal. Goal on Track (progressing): YES.) Long Term Goal  Evaluation of current treatment plan related to Anxiety and Depression,  Mental Health Concerns  self-management and patient's adherence to plan as established by provider. Discussed plans with patient for ongoing care management follow up and provided patient with direct contact information for care management team Advised patient to call the office for changes in mood, anxiety, depression, or new mental health concerns; Provided education to patient re: support from CCM team and LCSW if needed, mindfulness, and helped the patient reset her my Chart password as the patient has been unable to get into her my Chart; Reviewed medications with patient and discussed compliance, the patient is weaning down off of her Cymbalta and is doing well at this time with the changes; Provided patient with mindfulness and stress management  educational materials related to anxiety and depression ; Reviewed scheduled/upcoming provider appointments including 02-10-2022 at 11 am; Discussed plans with patient for ongoing care management follow up and provided patient with direct contact information for care management team; Advised patient to discuss mental health concerns, changes with anxiety and depression  with provider; Screening for signs and symptoms of depression related to chronic disease state;  Assessed social determinant of health barriers;   Hyperlipidemia:  (Status: New goal. Goal on Track (progressing): YES.) Long Term Goal  No results found for: CHOL, HDL, LDLCALC, LDLDIRECT, TRIG, CHOLHDL   Medication review performed; medication list updated in electronic medical record.  Provider established cholesterol goals reviewed; Counseled on importance of regular laboratory monitoring as prescribed; Provided HLD educational materials; Reviewed role and benefits of statin for ASCVD risk reduction; Discussed strategies to manage statin-induced myalgias; Reviewed importance of limiting foods high in cholesterol; Reviewed exercise goals and target of 150 minutes per  week;  Hypertension: (Status: New goal. Goal on Track (progressing): YES.) Long Term Goal  Last practice recorded BP readings:  BP Readings from Last 3 Encounters:  01/06/22 127/71  12/23/21 (!) 154/91  11/10/21 (!) 146/83  Most recent eGFR/CrCl: No results found for: EGFR  No components found for: CRCL  Evaluation of current treatment plan related to hypertension self management and patient's adherence to plan as established by provider;   Provided education to patient re: stroke prevention, s/s of heart attack and stroke; Reviewed prescribed diet heart healthy/ADA diet  Reviewed medications with patient and discussed importance of compliance;  Discussed plans with patient for ongoing care management follow up and provided patient with direct contact information for care management team; Advised patient, providing education and rationale, to monitor blood pressure daily and record, calling PCP for findings outside established parameters;  Advised patient to discuss blood pressure changes and trends  with provider; Provided education on prescribed diet heart healthy/ADA diet ;  Discussed complications of poorly controlled blood pressure such as heart disease, stroke, circulatory complications, vision complications, kidney impairment, sexual dysfunction;   Pain:  (Status: New goal. Goal on Track (progressing): YES.) Long Term Goal  Pain assessment performed. 01-12-2022: The patient rates her pain in her neck at an 8 today and says this is good for her. She also has neuropathy pain in her feet and hands. She denies any acute findings.  Medications reviewed. 01-12-2022: States it is time for her to get more shots in her neck to help with the pain Reviewed provider established plan for pain management; Discussed importance of adherence to all scheduled medical appointments; Counseled on the importance of reporting any/all new or changed pain symptoms or management strategies to pain management  provider; Advised patient to report to care team affect of pain on daily activities; Discussed use of relaxation techniques and/or diversional activities to assist with pain reduction (distraction, imagery, relaxation, massage, acupressure, TENS, heat, and cold application; Reviewed with patient prescribed pharmacological and nonpharmacological pain relief strategies; Advised patient to discuss unresolved pain, changes in level or intensity of pain  with provider;  Patient Goals/Self-Care Activities: Take medications as prescribed   Attend all scheduled provider appointments Call pharmacy for medication refills 3-7 days in advance of running out of medications Attend church or other social activities Perform all self care activities independently  Perform IADL's (shopping, preparing meals, housekeeping, managing finances) independently Call provider office for new concerns or questions  Work with the social worker to address care coordination needs and will continue to work with the clinical team to address health care and disease management related needs call the Suicide and Crisis Lifeline: 988 call the [place] National Suicide Prevention Lifeline: 781-122-2423 or TTY: 548-222-1111 TTY 360 883 7723) to talk to a trained counselor call 1-800-273-TALK (toll free, 24 hour hotline) if experiencing a Mental Health or [place]  keep appointment with eye doctor check blood sugar at prescribed times: twice daily, when you have symptoms of low or high blood sugar, and before and after exercise check feet daily for cuts, sores or redness enter blood sugar readings and medication or insulin into daily log take the blood sugar log to all doctor visits trim toenails straight across drink 6 to 8 glasses of water each day eat fish at least once per week fill half of plate with vegetables limit fast food meals to no more than 1 per week manage portion size prepare main meal at home 3  to 5 days each week read food labels for fat, fiber, carbohydrates and portion size keep feet up while sitting wash and dry feet carefully every day wear comfortable, cotton socks wear comfortable, well-fitting shoes avoid second hand smoke eliminate smoking in my home identify and avoid work-related triggers identify and remove indoor air pollutants limit outdoor activity during cold weather listen for public air quality announcements every day do breathing exercises every day arrange respite care for caregiver attend pulmonary rehabilitation begin a symptom diary develop a rescue plan eliminate symptom triggers at home follow rescue plan if symptoms flare-up use an extra pillow to sleep develop a new routine to improve sleep don't eat or exercise right before bedtime get at least 7 to 8 hours of sleep at night use devices that will help like a cane, sock-puller or reacher do exercises in a comfortable position that makes breathing as easy as possible check blood pressure weekly choose a place to take my blood pressure (home, clinic or office, retail store) write blood pressure results in a log or diary learn about high blood pressure keep a blood pressure log take blood pressure log to all doctor appointments call doctor for signs and symptoms of high blood pressure develop an action plan for high blood pressure keep all doctor appointments take medications for blood pressure exactly as prescribed report new symptoms to your doctor eat more whole grains, fruits and vegetables, lean meats and  healthy fats - call for medicine refill 2 or 3 days before it runs out - take all medications exactly as prescribed - call doctor with any symptoms you believe are related to your medicine - call doctor when you experience any new symptoms - go to all doctor appointments as scheduled - adhere to prescribed diet: Heart Healthy/ADA diet       Consent to CCM Services: Ms. Burgin was  given information about Chronic Care Management services including:  CCM service includes personalized support from designated clinical staff supervised by her physician, including individualized plan of care and coordination with other care providers 24/7 contact phone numbers for assistance for urgent and routine care needs. Service will only be billed when office clinical staff spend 20 minutes or more in a month to coordinate care. Only one practitioner may furnish and bill the service in a calendar month. The patient may stop CCM services at any time (effective at the end of the month) by phone call to the office staff. The patient will be responsible for cost sharing (co-pay) of up to 20% of the service fee (after annual deductible is met).  Patient agreed to services and verbal consent obtained.   Patient verbalizes understanding of instructions and care plan provided today and agrees to view in [place]. Active MyChart status confirmed with patient.    Telephone follow up appointment with care management team member scheduled for: 02-02-2022 at 230 pm  Noreene Larsson RN, MSN, [place] Mobile: [phone]

## 2022-01-13 ENCOUNTER — Other Ambulatory Visit: Payer: Self-pay | Admitting: Internal Medicine

## 2022-01-13 DIAGNOSIS — J449 Chronic obstructive pulmonary disease, unspecified: Secondary | ICD-10-CM | POA: Diagnosis not present

## 2022-01-13 DIAGNOSIS — G4733 Obstructive sleep apnea (adult) (pediatric): Secondary | ICD-10-CM | POA: Diagnosis not present

## 2022-01-13 DIAGNOSIS — R0609 Other forms of dyspnea: Secondary | ICD-10-CM | POA: Diagnosis not present

## 2022-01-13 NOTE — Telephone Encounter (Signed)
Requested Prescriptions  ?Pending Prescriptions Disp Refills  ?? amLODipine (NORVASC) 5 MG tablet [Pharmacy Med Name: AMLODIPINE BESYLATE 5 MG TAB] 90 tablet 0  ?  Sig: Take 1 tablet (5 mg total) by mouth daily.  ?  ? Cardiovascular: Calcium Channel Blockers 2 Passed - 01/12/2022 10:55 AM  ?  ?  Passed - Last BP in normal range  ?  BP Readings from Last 1 Encounters:  ?01/06/22 127/71  ?   ?  ?  Passed - Last Heart Rate in normal range  ?  Pulse Readings from Last 1 Encounters:  ?01/06/22 77  ?   ?  ?  Passed - Valid encounter within last 6 months  ?  Recent Outpatient Visits   ?      ? 1 week ago COPD exacerbation ([place])  ? [facility] [place], [place] W, NP  ? 6 months ago Mixed diabetic hyperlipidemia associated with type 2 diabetes mellitus ([place])  ? [facility] [place], [place] W, NP  ? 1 year ago Uncontrolled type 2 diabetes mellitus with hyperglycemia ([place])  ? [place], DO  ? 1 year ago Encounter to establish care with new doctor  ? [facility], Lupita Raider, FNP  ?  ?  ?Future Appointments   ?        ? In 1 week Bjorn Loser, MD [place]  ? In 4 weeks Baity, Coralie Keens, NP [facility], [place]  ?  ? ?  ?  ?  ? ?

## 2022-01-13 NOTE — Telephone Encounter (Signed)
Requested medication (s) are due for refill today - no ? ?Requested medication (s) are on the active medication list -no ? ?Future visit scheduled -yes ? ?Last refill: -unknown ? ?Notes to clinic: Request RF: medication not on current medication list, non delegated Rx ? ?Requested Prescriptions  ?Pending Prescriptions Disp Refills  ? fluticasone (FLONASE) 50 MCG/ACT nasal spray [Pharmacy Med Name: FLUTICASONE PROP 50 MCG SPRAY] 16 g 0  ?  Sig: Place 1 spray into both nostrils daily.  ?  ? Not Delegated - Ear, Nose, and Throat: Nasal Preparations - Corticosteroids Failed - 01/13/2022  1:29 PM  ?  ?  Failed - This refill cannot be delegated  ?  ?  Passed - Valid encounter within last 12 months  ?  Recent Outpatient Visits   ? ?      ? 1 week ago COPD exacerbation ([place])  ? [facility] [place], [place] W, NP  ? 6 months ago Mixed diabetic hyperlipidemia associated with type 2 diabetes mellitus ([place])  ? [facility] [place], [place] W, NP  ? 1 year ago Uncontrolled type 2 diabetes mellitus with hyperglycemia ([place])  ? [place], DO  ? 1 year ago Encounter to establish care with new doctor  ? [facility], Lupita Raider, FNP  ? ?  ?  ?Future Appointments   ? ?        ? In 1 week Bjorn Loser, MD [place]  ? In 4 weeks Baity, Coralie Keens, NP [facility], [place]  ? ?  ? ?  ?  ?  ? ? ? ?Requested Prescriptions  ?Pending Prescriptions Disp Refills  ? fluticasone (FLONASE) 50 MCG/ACT nasal spray [Pharmacy Med Name: FLUTICASONE PROP 50 MCG SPRAY] 16 g 0  ?  Sig: Place 1 spray into both nostrils daily.  ?  ? Not Delegated - Ear, Nose, and Throat: Nasal Preparations - Corticosteroids Failed - 01/13/2022  1:29 PM  ?  ?  Failed - This refill cannot be delegated  ?  ?  Passed - Valid encounter within last 12 months  ?  Recent Outpatient Visits   ? ?      ? 1 week ago COPD exacerbation ([place])  ? [facility] [place], [place] W, NP  ? 6 months ago Mixed diabetic hyperlipidemia associated with type 2 diabetes mellitus ([place])  ? [facility] [place], [place] W, NP  ? 1 year ago Uncontrolled type 2 diabetes mellitus with hyperglycemia ([place])  ? [place], DO  ? 1 year ago Encounter to establish care with new doctor  ? [facility], Lupita Raider, FNP  ? ?  ?  ?Future Appointments   ? ?        ? In 1 week Bjorn Loser, MD [place]  ? In 4 weeks Baity, Coralie Keens, NP [facility], [place]  ? ?  ? ?  ?  ?  ? ? ? ?

## 2022-01-18 DIAGNOSIS — F321 Major depressive disorder, single episode, moderate: Secondary | ICD-10-CM | POA: Diagnosis not present

## 2022-01-18 DIAGNOSIS — F419 Anxiety disorder, unspecified: Secondary | ICD-10-CM | POA: Diagnosis not present

## 2022-01-19 ENCOUNTER — Ambulatory Visit: Payer: Medicare HMO | Admitting: Pharmacist

## 2022-01-19 DIAGNOSIS — I10 Essential (primary) hypertension: Secondary | ICD-10-CM

## 2022-01-19 DIAGNOSIS — E1165 Type 2 diabetes mellitus with hyperglycemia: Secondary | ICD-10-CM

## 2022-01-19 DIAGNOSIS — J432 Centrilobular emphysema: Secondary | ICD-10-CM

## 2022-01-19 NOTE — Patient Instructions (Signed)
Visit Information ? ?Thank you for taking time to visit with me today. Please don't hesitate to contact me if I can be of assistance to you before our next scheduled telephone appointment. ? ?Following are the goals we discussed today:  ? Goals Addressed   ? ?  ?  ?  ?  ? This Visit's Progress  ?  Pharmacy Goals     ?  Our goal A1c is less than 7%. This corresponds with fasting sugars less than 130 and 2 hour after meal sugars less than 180. Please check your blood sugar and keep a log of the results ? ?Our goal bad cholesterol, or LDL, is less than 70 . This is why it is important to continue taking your rosuvastatin. ? ? ?Feel free to call me with any questions or concerns. I look forward to our next call! ? ?Wallace Cullens, PharmD, BCACP, CPP ?Clinical Pharmacist ?[facility] ?[place] ?[phone] ?  ? ?  ? ? ? ?Our next appointment is by telephone on 02/27/2022 at 10:45 am ? ?Please call the care guide team at [phone] if you need to cancel or reschedule your appointment.  ? ? ?Patient verbalizes understanding of instructions and care plan provided today and agrees to view in [place]. Active MyChart status confirmed with patient.   ? ?

## 2022-01-19 NOTE — Chronic Care Management (AMB) (Signed)
? ?Chronic Care Management ?CCM Pharmacy Note ? ?01/19/2022 ?Name:  Rachel Nielsen MRN:  [id number] DOB:  05/19/1951 ? ? ?Subjective: ?Rachel Nielsen is an 71 y.o. year old female who is a primary patient of Jearld Fenton, NP.  The CCM team was consulted for assistance with disease management and care coordination needs.   ? ?Engaged with patient by telephone for follow up visit for pharmacy case management and/or care coordination services.  ? ?Objective: ? ?Medications Reviewed Today   ? ? Reviewed by Rennis Petty, RPH-CPP (Pharmacist) on 01/19/22 at 1340  Med List Status: <None>  ? ?Medication Order Taking? Sig Documenting Provider Last Dose Status Informant  ?ACCU-CHEK GUIDE test strip [id number]  Use to check blood sugar up to twice a day as directed Jearld Fenton, NP  Active Self  ?Accu-Chek Softclix Lancets lancets [id number]  Use to check blood sugar up to twice a day as directed Jearld Fenton, NP  Active Self  ?albuterol (PROVENTIL) (2.5 MG/3ML) 0.083% nebulizer solution [id number] Yes Inhale 3 mLs (2.5 mg total) into the lungs every 4 (four) hours as needed for wheezing or shortness of breath. Lorella Nimrod, MD Taking Active   ?albuterol (VENTOLIN HFA) 108 (90 Base) MCG/ACT inhaler [id number] Yes Inhale 2-4 puffs by mouth every 4 hours as needed for wheezing, cough, and/or shortness of breath Malfi, Lupita Raider, FNP Taking Active Multiple Informants, Self  ?ALPRAZolam (XANAX) 1 MG tablet [id number] Yes Take 1 mg by mouth 3 (three) times daily as needed. [provider] Taking Active Multiple Informants, Self  ?amLODipine (NORVASC) 5 MG tablet [id number] Yes Take 1 tablet (5 mg total) by mouth daily. Jearld Fenton, NP Taking Active   ?asenapine (SAPHRIS) 5 MG SUBL 24 hr tablet [id number] Yes Place 5 mg under the tongue every evening. [provider] Taking Active Multiple Informants, Self  ?bisoprolol (ZEBETA) 10 MG tablet [id number] Yes Take 1 tablet (10 mg total) by mouth daily. OFFICE  VISIT NEEDED FOR ADDITIONAL REFILLS Garnette Gunner Coralie Keens, NP Taking Active Multiple Informants, Self  ?buPROPion (WELLBUTRIN XL) 300 MG 24 hr tablet [id number] Yes Take 1 tablet (300 mg total) by mouth daily. Carrie Mew, MD Taking Active Multiple Informants, Self  ?busPIRone (BUSPAR) 15 MG tablet [id number] Yes Take 15 mg by mouth 2 (two) times daily.  [provider] Taking Active Multiple Informants, Self  ?docusate sodium (COLACE) 100 MG capsule [id number] Yes Take 100 mg by mouth daily. [provider] Taking Active   ?Dulaglutide 3 MG/0.5ML SOPN [id number] Yes Inject 0.5 mLs (3 mg total) subcutaneously once a week [provider]  Active   ?FARXIGA 10 MG TABS tablet [id number] Yes Take 10 mg by mouth daily. [provider] Taking Active Multiple Informants, Self  ?fluticasone (FLONASE) 50 MCG/ACT nasal spray [id number] Yes Place 1 spray into both nostrils daily. Jearld Fenton, NP Taking Active   ?fluticasone-salmeterol (ADVAIR) 250-50 MCG/ACT AEPB [id number] Yes Inhale 1 puff into the lungs 2 (two) times daily. [provider] Taking Active Multiple Informants, Self  ?gabapentin (NEURONTIN) 600 MG tablet [id number] Yes Take 600-900 mg by mouth 3 (three) times daily. 600 mg AM, 600 mg pm and 900 mg at bedtime [provider] Taking Active Multiple Informants, Self  ?GLOBAL EASE INJECT PEN NEEDLES 32G X 4 MM MISC [id number]  Use as directed with insulin Kendell Bane, NP  Active Multiple Informants, Self  ?insulin aspart (NOVOLOG) 100 UNIT/ML injection [id number] Yes Inject 16 Units into the  skin 3 (three) times daily before meals. [provider] Taking Active Multiple Informants, Self  ?insulin glargine (LANTUS) 100 UNIT/ML injection [id number] Yes Inject 0.36 mLs (36 Units total) into the skin daily.  ?Patient taking differently: Inject 56 Units into the skin at bedtime.  ? Jearld Fenton, NP Taking Active Multiple Informants, Self  ?Insulin Pen  Needle (BD PEN NEEDLE NANO U/F) 32G X 4 MM MISC [id number]  Inject 1 Device into the skin daily. Jearld Fenton, NP  Active Multiple Informants, Self  ?Insulin Syringe-Needle U-100 (INSULIN SYRINGE 1CC/31GX5/16") 31G X 5/16" 1 ML MISC [id number]  1 Syringe by Does not apply route in the morning, at noon, in the evening, and at bedtime. Jearld Fenton, NP  Active Multiple Informants, Self  ?LATUDA 40 MG TABS tablet [id number] Yes Take 40 mg by mouth at bedtime. [provider] Taking Active Multiple Informants, Self  ?linaclotide Rolan Lipa) 290 MCG CAPS capsule [id number] Yes Take 1 capsule (290 mcg total) by mouth daily before breakfast. Jearld Fenton, NP Taking Active Multiple Informants, Self  ?metFORMIN (GLUCOPHAGE) 500 MG tablet [id number] Yes Take 1 tablet by mouth 2 (two) times daily. [provider] Taking Active Multiple Informants, Self  ?montelukast (SINGULAIR) 10 MG tablet [id number] Yes Take 1 tablet (10 mg total) by mouth daily. Carrie Mew, MD Taking Active Multiple Informants, Self  ?MYRBETRIQ 25 MG TB24 tablet [id number]  Take 1 tablet by mouth daily. [provider]  Active   ?Olopatadine HCl 0.2 % SOLN [id number]  Place 1 drop into both eyes daily. Verl Bangs, FNP  Active Multiple Informants, Self  ?omeprazole (PRILOSEC) 40 MG capsule [id number] Yes Take 1 capsule (40 mg total) by mouth in the morning and at bedtime. Jearld Fenton, NP Taking Active   ?predniSONE (DELTASONE) 5 MG tablet [id number] Yes Take 1 tablet (5 mg total) by mouth once daily [provider] Taking Active   ?rosuvastatin (CRESTOR) 10 MG tablet [id number] Yes Take 1 tablet (10 mg total) by mouth daily. Jearld Fenton, NP Taking Active   ?traMADol (ULTRAM) 50 MG tablet [id number] No Take 1 tablet by mouth 2 (two) times daily.  ?Patient not taking: Reported on 01/19/2022  ? [provider] Not Taking Active Self  ?TRAVATAN Z 0.004 % SOLN ophthalmic solution [id number] Yes Place 1  drop into both eyes at bedtime. Carrie Mew, MD Taking Active Multiple Informants, Self  ? ?  ?  ? ?  ? ? ?Pertinent Labs:  ?Lab Results  ?Component Value Date  ? HGBA1C 9.0 (H) 12/21/2021  ? ?Lab Results  ?Component Value Date  ? CREATININE 0.81 12/22/2021  ? BUN 20 12/22/2021  ? NA 140 12/22/2021  ? K 4.2 12/22/2021  ? CL 103 12/22/2021  ? CO2 30 12/22/2021  ? ?BP Readings from Last 3 Encounters:  ?01/06/22 127/71  ?12/23/21 (!) 154/91  ?11/10/21 (!) 146/83  ? ?Pulse Readings from Last 3 Encounters:  ?01/06/22 77  ?12/23/21 90  ?11/10/21 84  ? ? ? ?SDOH:  (Social Determinants of Health) assessments and interventions performed:  ? ? ?CCM Care Plan ? ?Review of patient past medical history, allergies, medications, health status, including review of consultants reports, laboratory and other test data, was performed as part of comprehensive evaluation and provision of chronic care management services.  ? ?Care Plan : PharmD - Medication Management/T2DM  ?Updates made by Rennis Petty, RPH-CPP since 01/19/2022 12:00 AM  ?  ? ?Problem: Disease Progression   ?  ? ?  Long-Range Goal: Disease Progression Prevented or Minimized   ?Start Date: 01/19/2022  ?Expected End Date: 04/19/2022  ?Priority: High  ?Note:   ?Current Barriers:  ?Unable to achieve control of blood sugar  ?Chronic Disease Management support and education needs related to managing HTN, GERD, major depressive disorder, Type II DM, history of CVA, diabetic neuropathy, GAD, OSA, HLD, constipation, obesity, COPD ? ?Pharmacist Clinical Goal(s):  ?patient will achieve adherence to monitoring guidelines and medication adherence to achieve therapeutic efficacy ?achieve control of blood sugar as evidenced by A1C  through collaboration with PharmD and provider.  ? ? ?Interventions: ?1:1 collaboration with Jearld Fenton, NP regarding development and update of comprehensive plan of care as evidenced by provider attestation and co-signature ?Inter-disciplinary  care team collaboration (see longitudinal plan of care) ?Perform chart review ?Office Visit with [facility] Pulmonology on 3/14 for post hospital check up. Provider advised:  ?continue albuterol q 6 hrs p

## 2022-01-20 ENCOUNTER — Other Ambulatory Visit: Payer: Self-pay | Admitting: Internal Medicine

## 2022-01-20 DIAGNOSIS — K5900 Constipation, unspecified: Secondary | ICD-10-CM

## 2022-01-21 NOTE — Telephone Encounter (Signed)
Requested Prescriptions  ?Pending Prescriptions Disp Refills  ?? LINZESS 290 MCG CAPS capsule [Pharmacy Med Name: LINZESS 290 MCG CAPSULE] 90 capsule 3  ?  Sig: Take 1 capsule (290 mcg total) by mouth daily before breakfast.  ?  ? Gastroenterology: Irritable Bowel Syndrome Passed - 01/20/2022 11:52 AM  ?  ?  Passed - Valid encounter within last 12 months  ?  Recent Outpatient Visits   ?      ? 2 weeks ago COPD exacerbation ([place])  ? [facility] [place], [place] W, NP  ? 6 months ago Mixed diabetic hyperlipidemia associated with type 2 diabetes mellitus ([place])  ? [facility] [place], [place] W, NP  ? 1 year ago Uncontrolled type 2 diabetes mellitus with hyperglycemia ([place])  ? [place], DO  ? 1 year ago Encounter to establish care with new doctor  ? [facility], Lupita Raider, FNP  ?  ?  ?Future Appointments   ?        ? In 5 days MacDiarmid, Nicki Reaper, MD [place]  ? In 2 weeks Baity, Coralie Keens, NP [facility], [place]  ?  ? ?  ?  ?  ? ?

## 2022-01-26 ENCOUNTER — Ambulatory Visit: Payer: Self-pay

## 2022-01-26 ENCOUNTER — Other Ambulatory Visit: Payer: Self-pay

## 2022-01-26 ENCOUNTER — Ambulatory Visit (INDEPENDENT_AMBULATORY_CARE_PROVIDER_SITE_OTHER): Payer: Medicare HMO | Admitting: Urology

## 2022-01-26 DIAGNOSIS — M5412 Radiculopathy, cervical region: Secondary | ICD-10-CM

## 2022-01-26 DIAGNOSIS — R32 Unspecified urinary incontinence: Secondary | ICD-10-CM

## 2022-01-26 DIAGNOSIS — I10 Essential (primary) hypertension: Secondary | ICD-10-CM

## 2022-01-26 DIAGNOSIS — F331 Major depressive disorder, recurrent, moderate: Secondary | ICD-10-CM

## 2022-01-26 DIAGNOSIS — F411 Generalized anxiety disorder: Secondary | ICD-10-CM

## 2022-01-26 LAB — URINALYSIS, COMPLETE
Bilirubin, UA: NEGATIVE
Glucose, UA: NEGATIVE
Ketones, UA: NEGATIVE
Leukocytes,UA: NEGATIVE
Nitrite, UA: NEGATIVE
Protein,UA: NEGATIVE
Specific Gravity, UA: 1.01 (ref 1.005–1.030)
Urobilinogen, Ur: 0.2 mg/dL (ref 0.2–1.0)
pH, UA: 5.5 (ref 5.0–7.5)

## 2022-01-26 LAB — MICROSCOPIC EXAMINATION: Bacteria, UA: NONE SEEN

## 2022-01-26 MED ORDER — MIRABEGRON ER 50 MG PO TB24: 50.0000 mg | ORAL_TABLET | Freq: Every day | ORAL | 11 refills | Status: DC

## 2022-01-26 NOTE — Chronic Care Management (AMB) (Signed)
?Chronic Care Management  ? ?CCM RN Visit Note ? ?01/26/2022 ?Name: Rachel Nielsen MRN: [id number] DOB: 1951/03/01 ? ?Subjective: ?Rachel Nielsen is a 71 y.o. year old female who is a primary care patient of Jearld Fenton, NP. The care management team was consulted for assistance with disease management and care coordination needs.   ? ?Engaged with patient by telephone for follow up visit in response to provider referral for case management and/or care coordination services.  ? ?Consent to Services:  ?The patient was given information about Chronic Care Management services, agreed to services, and gave verbal consent prior to initiation of services.  Please see initial visit note for detailed documentation.  ? ?Patient agreed to services and verbal consent obtained.  ? ?Assessment: Review of patient past medical history, allergies, medications, health status, including review of consultants reports, laboratory and other test data, was performed as part of comprehensive evaluation and provision of chronic care management services.  ? ?SDOH (Social Determinants of Health) assessments and interventions performed:   ? ?CCM Care Plan ? ?Allergies  ?Allergen Reactions  ? Ace Inhibitors Cough  ? ? ?Outpatient Encounter Medications as of 01/26/2022  ?Medication Sig  ? ACCU-CHEK GUIDE test strip Use to check blood sugar up to twice a day as directed  ? Accu-Chek Softclix Lancets lancets Use to check blood sugar up to twice a day as directed  ? albuterol (PROVENTIL) (2.5 MG/3ML) 0.083% nebulizer solution Inhale 3 mLs (2.5 mg total) into the lungs every 4 (four) hours as needed for wheezing or shortness of breath.  ? albuterol (VENTOLIN HFA) 108 (90 Base) MCG/ACT inhaler Inhale 2-4 puffs by mouth every 4 hours as needed for wheezing, cough, and/or shortness of breath  ? ALPRAZolam (XANAX) 1 MG tablet Take 1 mg by mouth 3 (three) times daily as needed.  ? amLODipine (NORVASC) 5 MG tablet Take 1 tablet (5 mg total) by mouth daily.   ? asenapine (SAPHRIS) 5 MG SUBL 24 hr tablet Place 5 mg under the tongue every evening.  ? bisoprolol (ZEBETA) 10 MG tablet Take 1 tablet (10 mg total) by mouth daily. OFFICE VISIT NEEDED FOR ADDITIONAL REFILLS  ? buPROPion (WELLBUTRIN XL) 300 MG 24 hr tablet Take 1 tablet (300 mg total) by mouth daily.  ? busPIRone (BUSPAR) 15 MG tablet Take 15 mg by mouth 2 (two) times daily.   ? docusate sodium (COLACE) 100 MG capsule Take 100 mg by mouth daily.  ? Dulaglutide 3 MG/0.5ML SOPN Inject 0.5 mLs (3 mg total) subcutaneously once a week  ? FARXIGA 10 MG TABS tablet Take 10 mg by mouth daily.  ? fluticasone (FLONASE) 50 MCG/ACT nasal spray Place 1 spray into both nostrils daily.  ? fluticasone-salmeterol (ADVAIR) 250-50 MCG/ACT AEPB Inhale 1 puff into the lungs 2 (two) times daily.  ? gabapentin (NEURONTIN) 600 MG tablet Take 600-900 mg by mouth 3 (three) times daily. 600 mg AM, 600 mg pm and 900 mg at bedtime  ? GLOBAL EASE INJECT PEN NEEDLES 32G X 4 MM MISC Use as directed with insulin  ? insulin aspart (NOVOLOG) 100 UNIT/ML injection Inject 16 Units into the skin 3 (three) times daily before meals.  ? insulin glargine (LANTUS) 100 UNIT/ML injection Inject 0.36 mLs (36 Units total) into the skin daily. (Patient taking differently: Inject 56 Units into the skin at bedtime.)  ? Insulin Pen Needle (BD PEN NEEDLE NANO U/F) 32G X 4 MM MISC Inject 1 Device into the skin daily.  ?  Insulin Syringe-Needle U-100 (INSULIN SYRINGE 1CC/31GX5/16") 31G X 5/16" 1 ML MISC 1 Syringe by Does not apply route in the morning, at noon, in the evening, and at bedtime.  ? LATUDA 40 MG TABS tablet Take 40 mg by mouth at bedtime.  ? LINZESS 290 MCG CAPS capsule Take 1 capsule (290 mcg total) by mouth daily before breakfast.  ? montelukast (SINGULAIR) 10 MG tablet Take 1 tablet (10 mg total) by mouth daily.  ? Olopatadine HCl 0.2 % SOLN Place 1 drop into both eyes daily.  ? omeprazole (PRILOSEC) 40 MG capsule Take 1 capsule (40 mg total) by  mouth in the morning and at bedtime.  ? predniSONE (DELTASONE) 5 MG tablet Take 1 tablet (5 mg total) by mouth once daily  ? rosuvastatin (CRESTOR) 10 MG tablet Take 1 tablet (10 mg total) by mouth daily.  ? traMADol (ULTRAM) 50 MG tablet Take 1 tablet by mouth 2 (two) times daily.  ? TRAVATAN Z 0.004 % SOLN ophthalmic solution Place 1 drop into both eyes at bedtime.  ? [DISCONTINUED] metFORMIN (GLUCOPHAGE) 500 MG tablet Take 1 tablet by mouth 2 (two) times daily.  ? [DISCONTINUED] MYRBETRIQ 25 MG TB24 tablet Take 1 tablet by mouth daily.  ? ?No facility-administered encounter medications on file as of 01/26/2022.  ? ? ?Patient Active Problem List  ? Diagnosis Date Noted  ? Mixed diabetic hyperlipidemia associated with type 2 diabetes mellitus ([place]) 07/09/2021  ? Chronic constipation 07/09/2021  ? Cervical radiculopathy 07/09/2021  ? Lumbar radiculopathy 07/09/2021  ? Class 2 obesity due to excess calories with body mass index (BMI) of 37.0 to 37.9 in adult 07/09/2021  ? OSA (obstructive sleep apnea) 12/18/2020  ? Diabetic neuropathy ([place]) 12/17/2020  ? Centrilobular emphysema ([place]) 12/17/2020  ? GAD (generalized anxiety disorder) 12/17/2020  ? History of stroke 10/21/2020  ? Uncontrolled type 2 diabetes mellitus with hyperglycemia ([place]) 03/14/2019  ? Hypertension 12/21/2017  ? GERD (gastroesophageal reflux disease) 12/21/2017  ? Major depressive disorder, recurrent, moderate ([place]) 12/21/2017  ? ? ?Conditions to be addressed/monitored:HTN, Anxiety, Depression, and Chronic pain  ? ?Care Plan : RNCM: General Plan of Care (Adult) for Chronic Disease Management and Care Coordination Needs  ?Updates made by Vanita Ingles, RN since 01/26/2022 12:00 AM  ?  ? ?Problem: RNCM: Development of Plan of Care for Chronic Disease Management (HTN, HLD, DM, COPD, Depression, Anxiety, Chronic Pain and Falls)   ?Priority: High  ?  ? ?Long-Range Goal: RNCM: Effective Management  of Plan of Care for Chronic Disease Management (HTN, HLD,  DM, COPD, Depression, Anxiety, Chronic Pain and Falls)   ?Start Date: 01/12/2022  ?Expected End Date: 01/13/2023  ?Priority: High  ?Note:   ?Current Barriers:  ?Knowledge Deficits related to plan of care for management of HTN, HLD, COPD, DMII, Chronic Pain, and Depression, Anxiety, Falls prevention  ?Care Coordination needs related to Mental Health Concerns   ?Chronic Disease Management support and education needs related to HTN, HLD, COPD, DMII, Chronic Pain, and Depression, Anxiety, Falls prevention  ? ?RNCM Clinical Goal(s):  ?Patient will verbalize basic understanding of HTN, HLD, COPD, DMII, Anxiety, Depression, and Chronic pain and falls disease process and self health management plan as evidenced by routine office visits, lab testing, VS stable, compliance with the plan of care and dietary restrictions, and working with the CCM team to effectively manage health and well being.  ?demonstrate understanding of rationale for each prescribed medication as evidenced by compliance with medications and calling for refills  before running out of medications     ?attend all scheduled medical appointments: 02-10-2022 at 11 am as evidenced by keeping appointments and calling for schedule change needs         ?demonstrate improved and ongoing health management independence as evidenced by no exacerbations in chronic conditions, stable VS and labs, and effective management of chronic conditions        ?demonstrate ongoing self health care management ability effectively manage Chronic conditions  as evidenced by  working with the CCM team through collaboration with Consulting civil engineer, provider, and care team.  ? ?Interventions: ?1:1 collaboration with primary care provider regarding development and update of comprehensive plan of care as evidenced by provider attestation and co-signature ?Inter-disciplinary care team collaboration (see longitudinal plan of care) ?Evaluation of current treatment plan related to  self management and  patient's adherence to plan as established by provider ? ? ?COPD: (Status: New goal. Goal on Track (progressing): YES.) Long Term Goal  ?Reviewed medications with patient, including use of prescribed mai

## 2022-01-26 NOTE — Patient Instructions (Signed)
Visit Information ? ?Thank you for taking time to visit with me today. Please don't hesitate to contact me if I can be of assistance to you before our next scheduled telephone appointment. ? ?Following are the goals we discussed today:  ?RNCM Clinical Goal(s):  ?Patient will verbalize basic understanding of HTN, HLD, COPD, DMII, Anxiety, Depression, and Chronic pain and falls disease process and self health management plan as evidenced by routine office visits, lab testing, VS stable, compliance with the plan of care and dietary restrictions, and working with the CCM team to effectively manage health and well being.  ?demonstrate understanding of rationale for each prescribed medication as evidenced by compliance with medications and calling for refills before running out of medications     ?attend all scheduled medical appointments: 02-10-2022 at 11 am as evidenced by keeping appointments and calling for schedule change needs         ?demonstrate improved and ongoing health management independence as evidenced by no exacerbations in chronic conditions, stable VS and labs, and effective management of chronic conditions        ?demonstrate ongoing self health care management ability effectively manage Chronic conditions  as evidenced by  working with the CCM team through collaboration with Consulting civil engineer, provider, and care team.  ?  ?Interventions: ?1:1 collaboration with primary care provider regarding development and update of comprehensive plan of care as evidenced by provider attestation and co-signature ?Inter-disciplinary care team collaboration (see longitudinal plan of care) ?Evaluation of current treatment plan related to  self management and patient's adherence to plan as established by provider ?  ?  ?COPD: (Status: New goal. Goal on Track (progressing): YES.) Long Term Goal  ?Reviewed medications with patient, including use of prescribed maintenance and rescue inhalers, and provided instruction on  medication management and the importance of adherence ?Provided patient with basic written and verbal COPD education on self care/management/and exacerbation prevention ?Advised patient to track and manage COPD triggers ?Provided written and verbal instructions on pursed lip breathing and utilized returned demonstration as teach back ?Provided instruction about proper use of medications used for management of COPD including inhalers ?Advised patient to self assesses COPD action plan zone and make appointment with provider if in the yellow zone for 48 hours without improvement ?Advised patient to engage in light exercise as tolerated 3-5 days a week to aid in the the management of COPD ?Provided education about and advised patient to utilize infection prevention strategies to reduce risk of respiratory infection ?Discussed the importance of adequate rest and management of fatigue with COPD ?  ?Diabetes:  (Status: New goal. Goal on Track (progressing): YES.) Long Term Goal  ?  ?     ?Lab Results  ?Component Value Date  ?  HGBA1C 9.0 (H) 12/21/2021  ?  ?Assessed patient's understanding of A1c goal: <7% ?Provided education to patient about basic DM disease process; ?Reviewed medications with patient and discussed importance of medication adherence. 01-12-2022: The patient with recent changes in her medications with the endocrinologist. The patient states she has a good understanding of how to take her medications. Education and support given.         ?Reviewed prescribed diet with patient heart healthy/ADA diet. 01-12-2022: Review of  heart healthy/ADA diet. Will send information by email and mail on healthy eating ; ?Counseled on importance of regular laboratory monitoring as prescribed. 01-12-2022: Review of monitoring for A1C levels on a consistent basis. ;        ?Discussed  plans with patient for ongoing care management follow up and provided patient with direct contact information for care management team;       ?Provided patient with written educational materials related to hypo and hyperglycemia and importance of correct treatment. 01-12-2022: The patient denies any lows but states her blood sugars have been in the 200's, they were in the 200's but endocrinologist made new changes recently.        ?Reviewed scheduled/upcoming provider appointments including: 02-10-2022 at 11 am;         ?Advised patient, providing education and rationale, to check cbg before meals and at bedtime and record. 01-12-2022: The patient states that she takes her blood sugars BID. She states that they are doing better and states that her average is about 200. Review of goal of blood sugars fasting is <130 and post prandial is <180.     ?call provider for findings outside established parameters;       ?Referral made to pharmacy team for assistance with medications management and reconciliation. The patient reminded of upcoming appointment with pharm D on 01-19-2022;       ?Review of patient status, including review of consultants reports, relevant laboratory and other test results, and medications completed;       ?Advised patient to discuss changes in DM plan of care, new questions or concerns with provider;      ?  ?Falls:  (Status: New goal. Goal on Track (progressing): YES.) Long Term Goal  ?Provided written and verbal education re: potential causes of falls and Fall prevention strategies ?Reviewed medications and discussed potential side effects of medications such as dizziness and frequent urination ?Advised patient of importance of notifying provider of falls. 01-12-2022: Advised the patient to let the office know of any new falls.  ?Assessed for signs and symptoms of orthostatic hypotension. 01-12-2022: The patient denies any issues with orthostatic hypotension  ?Assessed for falls since last encounter. 01-12-2022: The patient states that she loses her balance a lot. Last known fall was 12-04-2021. The patient uses a cane and also has a walker.  Review of safety and fall precautions.  ?Assessed patients knowledge of fall risk prevention secondary to previously provided education ?Provided patient information for fall alert systems ?Assessed working status of life alert bracelet and patient adherence ?Advised patient to discuss new falls and safety concerns with provider ?  ?Anxiety and Depression   (Status: New goal. Goal on Track (progressing): YES.) Long Term Goal  ?Evaluation of current treatment plan related to Anxiety and Depression, Mental Health Concerns  self-management and patient's adherence to plan as established by provider. 01-26-2022: The patient is having pain in her neck and hands and this is causing her to have more anxiety and depression. She left a VM after hours on 01-23-2022 for the [facility] asking for assistance with her provider who gave her injections. The patient was headed to an appointment but was able to take the information down as she was not driving. She will follow up with Dr. Alba Destine for new injections if possible. ?Discussed plans with patient for ongoing care management follow up and provided patient with direct contact information for care management team ?Advised patient to call the office for changes in mood, anxiety, depression, or new mental health concerns; ?Provided education to patient re: support from CCM team and LCSW if needed, mindfulness, and helped the patient reset her my Chart password as the patient has been unable to get into her my Chart; ?Reviewed medications with  patient and discussed compliance, the patient is weaning down off of her Cymbalta and is doing well at this time with the changes; ?Provided patient with mindfulness and stress management  educational materials related to anxiety and depression ; ?Reviewed scheduled/upcoming provider appointments including 02-10-2022 at 11 am; ?Discussed plans with patient for ongoing care management follow up and provided patient with direct contact information for  care management team; ?Advised patient to discuss mental health concerns, changes with anxiety and depression  with provider; ?Screening for signs and symptoms of depression related to chronic disease state;  ?[place]

## 2022-01-26 NOTE — Progress Notes (Signed)
? ?01/26/2022 ?10:44 AM  ? ?Rachel Nielsen ?September 04, 1951 ?[id number] ? ?Referring provider: Jearld Fenton, NP ?[address] ?[place],  [place] [address] ? ?Chief Complaint  ?Patient presents with  ? Urinary Incontinence  ? ? ?HPI: ?I was consulted to assess the patient for urinary incontinence.  She has urge incontinence especially when she goes from sitting to standing position.  She leaks with coughing sneezing and leaning forward but the urge component is more severe.  Sometimes she leaks without awareness.  She has high-volume bedwetting.  She wears 4 pads a day sometimes moderately wet sometimes soaks ? ?She voids every 1-2 hours during the day and every 2 hours at night.  She can double void a modest amount a few times. ? ?She had a bladder suspension years ago.  She is an insulin-dependent diabetic.  She has had a stroke.  She has had a hysterectomy.  She rarely was sneezing can lose bowel control but when she sits she has a bowel movement at times. ?  ?Uses a cane and walker for balance and for mild weakness on one side   ?  ?Patient has mixed incontinence and likely a primarily an overactive bladder with bedwetting as well.  She has frequency and nocturia.  Because of her medical issues and less mobility I did not order urodynamics but mentioned I may need to test in the future.  I gave her Myrbetriq 25 mg samples and prescription and come back for pelvic examination cystoscopy in 5 to 6 weeks.  Call in 3 days of Diflucan for yeast in the urine.  Call if urine culture is positive.  Patient was grateful not to travel to [facility] for urodynamics. ?  ?Last culture negative ?Patient is 50% better on Myrbetriq 25 mg.  She said urine is foul-smelling and cloudy.  Last culture negative but culture last year positive ?Cystoscopy: Patient underwent flexible cystoscopy.  Bladder mucosa and trigone were normal.  Urine was a bit cloudy.  There was a lot of white flecks.  Urine sent for culture ? ?I am suspect patient may have  a bladder infection.  I will call in ciprofloxacin 250 mg twice a day for 7 days.  I will increase the dose of Myrbetriq to 50 mg.  1 or both may improve continence before we switch medication.  Reassess in 6 weeks ? ?Today ?Frequency stable.  Culture in January was positive and culture last September was negative ?Patient was hospitalized with COPD.  Never took the Myrbetriq higher dose and wants to.  Clinically not infected but I sent the urine for culture ? ?  ?    ? ? ?PMH: ?Past Medical History:  ?Diagnosis Date  ? Allergy   ? Anxiety   ? Asthma   ? Colon polyps   ? Constipation   ? COPD (chronic obstructive pulmonary disease) ([place])   ? Depression   ? GERD (gastroesophageal reflux disease)   ? Glaucoma   ? Heart attack ([place])   ? Hypertension   ? Sleep apnea   ? Sleep apnea   ? Stroke [facility])   ? Urinary incontinence   ? ? ?Surgical History: ?Past Surgical History:  ?Procedure Laterality Date  ? APPENDECTOMY    ? CHOLECYSTECTOMY    ? COLONOSCOPY WITH PROPOFOL N/A 07/20/2018  ? Procedure: COLONOSCOPY WITH PROPOFOL;  Surgeon: Toledo, Benay Pike, MD;  Location: [facility] ENDOSCOPY;  Service: Gastroenterology;  Laterality: N/A;  ? ESOPHAGOGASTRODUODENOSCOPY N/A 07/20/2018  ? Procedure: ESOPHAGOGASTRODUODENOSCOPY (EGD);  Surgeon: Toledo, Benay Pike, MD;  Location: [facility] ENDOSCOPY;  Service: Gastroenterology;  Laterality: N/A;  ? GASTRECTOMY    ? TUBAL LIGATION    ? VAGINAL HYSTERECTOMY    ? ? ?Home Medications:  ?Allergies as of 01/26/2022   ? ?   Reactions  ? Ace Inhibitors Cough  ? ?  ? ?  ?Medication List  ?  ? ?  ? Accurate as of January 26, 2022 10:44 AM. If you have any questions, ask your nurse or doctor.  ?  ?  ? ?  ? ?Accu-Chek Guide test strip ?Generic drug: glucose blood ?Use to check blood sugar up to twice a day as directed ?  ?Accu-Chek Softclix Lancets lancets ?Use to check blood sugar up to twice a day as directed ?  ?albuterol 108 (90 Base) MCG/ACT inhaler ?Commonly known as: VENTOLIN HFA ?Inhale 2-4 puffs by mouth  every 4 hours as needed for wheezing, cough, and/or shortness of breath ?  ?albuterol (2.5 MG/3ML) 0.083% nebulizer solution ?Commonly known as: PROVENTIL ?Inhale 3 mLs (2.5 mg total) into the lungs every 4 (four) hours as needed for wheezing or shortness of breath. ?  ?ALPRAZolam 1 MG tablet ?Commonly known as: Duanne Moron ?Take 1 mg by mouth 3 (three) times daily as needed. ?  ?amLODipine 5 MG tablet ?Commonly known as: NORVASC ?Take 1 tablet (5 mg total) by mouth daily. ?  ?asenapine 5 MG Subl 24 hr tablet ?Commonly known as: SAPHRIS ?Place 5 mg under the tongue every evening. ?  ?bisoprolol 10 MG tablet ?Commonly known as: ZEBETA ?Take 1 tablet (10 mg total) by mouth daily. OFFICE VISIT NEEDED FOR ADDITIONAL REFILLS ?  ?buPROPion 300 MG 24 hr tablet ?Commonly known as: WELLBUTRIN XL ?Take 1 tablet (300 mg total) by mouth daily. ?  ?busPIRone 15 MG tablet ?Commonly known as: BUSPAR ?Take 15 mg by mouth 2 (two) times daily. ?  ?docusate sodium 100 MG capsule ?Commonly known as: COLACE ?Take 100 mg by mouth daily. ?  ?Dulaglutide 3 MG/0.5ML Sopn ?Inject 0.5 mLs (3 mg total) subcutaneously once a week ?  ?Farxiga 10 MG Tabs tablet ?Generic drug: dapagliflozin propanediol ?Take 10 mg by mouth daily. ?  ?fluticasone 50 MCG/ACT nasal spray ?Commonly known as: FLONASE ?Place 1 spray into both nostrils daily. ?  ?fluticasone-salmeterol 250-50 MCG/ACT Aepb ?Commonly known as: ADVAIR ?Inhale 1 puff into the lungs 2 (two) times daily. ?  ?gabapentin 600 MG tablet ?Commonly known as: NEURONTIN ?Take 600-900 mg by mouth 3 (three) times daily. 600 mg AM, 600 mg pm and 900 mg at bedtime ?  ?Global Ease Inject Pen Needles 32G X 4 MM Misc ?Generic drug: Insulin Pen Needle ?Use as directed with insulin ?  ?BD Pen Needle Nano U/F 32G X 4 MM Misc ?Generic drug: Insulin Pen Needle ?Inject 1 Device into the skin daily. ?  ?insulin aspart 100 UNIT/ML injection ?Commonly known as: novoLOG ?Inject 16 Units into the skin 3 (three) times daily  before meals. ?  ?insulin glargine 100 UNIT/ML injection ?Commonly known as: Lantus ?Inject 0.36 mLs (36 Units total) into the skin daily. ?What changed:  ?how much to take ?when to take this ?  ?INSULIN SYRINGE 1CC/31GX5/16" 31G X 5/16" 1 ML Misc ?1 Syringe by Does not apply route in the morning, at noon, in the evening, and at bedtime. ?  ?Latuda 40 MG Tabs tablet ?Generic drug: lurasidone ?Take 40 mg by mouth at bedtime. ?  ?Linzess 290 MCG Caps capsule ?Generic  drug: linaclotide ?Take 1 capsule (290 mcg total) by mouth daily before breakfast. ?  ?metFORMIN 500 MG tablet ?Commonly known as: GLUCOPHAGE ?Take 1 tablet by mouth 2 (two) times daily. ?  ?montelukast 10 MG tablet ?Commonly known as: SINGULAIR ?Take 1 tablet (10 mg total) by mouth daily. ?  ?Myrbetriq 25 MG Tb24 tablet ?Generic drug: mirabegron ER ?Take 1 tablet by mouth daily. ?  ?Olopatadine HCl 0.2 % Soln ?Place 1 drop into both eyes daily. ?  ?omeprazole 40 MG capsule ?Commonly known as: PRILOSEC ?Take 1 capsule (40 mg total) by mouth in the morning and at bedtime. ?  ?predniSONE 5 MG tablet ?Commonly known as: DELTASONE ?Take 1 tablet (5 mg total) by mouth once daily ?  ?rosuvastatin 10 MG tablet ?Commonly known as: CRESTOR ?Take 1 tablet (10 mg total) by mouth daily. ?  ?traMADol 50 MG tablet ?Commonly known as: ULTRAM ?Take 1 tablet by mouth 2 (two) times daily. ?  ?Travatan Z 0.004 % Soln ophthalmic solution ?Generic drug: Travoprost (BAK Free) ?Place 1 drop into both eyes at bedtime. ?  ? ?  ? ? ?Allergies:  ?Allergies  ?Allergen Reactions  ? Ace Inhibitors Cough  ? ? ?Family History: ?Family History  ?Problem Relation Age of Onset  ? Hypertension Mother   ? Atrial fibrillation Mother   ? Heart attack Mother   ? Hypertension Father   ? Stomach cancer Father   ? Diabetes Father   ? Liver cancer Father   ? Diabetes Sister   ? Hypertension Sister   ? ? ?Social History:  reports that she has quit smoking. She has never used smokeless tobacco. She  reports that she does not currently use alcohol. She reports that she does not use drugs. ? ?ROS: ?  ? ?  ? ?  ? ?  ? ?  ? ?  ? ?  ? ?  ? ?  ? ?  ? ?  ? ?  ? ?  ? ?Physical Exam: ?There were no vitals

## 2022-01-28 LAB — CULTURE, URINE COMPREHENSIVE

## 2022-01-30 ENCOUNTER — Telehealth: Payer: Self-pay

## 2022-01-30 DIAGNOSIS — E1159 Type 2 diabetes mellitus with other circulatory complications: Secondary | ICD-10-CM

## 2022-01-30 DIAGNOSIS — Z794 Long term (current) use of insulin: Secondary | ICD-10-CM | POA: Diagnosis not present

## 2022-01-30 DIAGNOSIS — E785 Hyperlipidemia, unspecified: Secondary | ICD-10-CM | POA: Diagnosis not present

## 2022-01-30 DIAGNOSIS — J449 Chronic obstructive pulmonary disease, unspecified: Secondary | ICD-10-CM

## 2022-01-30 DIAGNOSIS — I10 Essential (primary) hypertension: Secondary | ICD-10-CM

## 2022-01-30 NOTE — Telephone Encounter (Signed)
Incoming call from pt on triage line who states that the sweat in between her skin folds smells like urine. She questions if this is normal. Pt denies urinary incontinence episodes. Advised pt that this is not normal, advised pt on adequate hygiene and the use of gold bond medicated powder in her folds. In addition advised pt if things are not improving, call PCP, or request dermatology referral. Pt voiced understanding.  ?

## 2022-02-01 DIAGNOSIS — F419 Anxiety disorder, unspecified: Secondary | ICD-10-CM | POA: Diagnosis not present

## 2022-02-01 DIAGNOSIS — F321 Major depressive disorder, single episode, moderate: Secondary | ICD-10-CM | POA: Diagnosis not present

## 2022-02-02 ENCOUNTER — Telehealth: Payer: Self-pay

## 2022-02-02 ENCOUNTER — Telehealth: Payer: Medicare HMO

## 2022-02-02 NOTE — Telephone Encounter (Signed)
?  Care Management  ? ?Follow Up Note ? ? ?02/02/2022 ?Name: Rachel Nielsen MRN: [id number] DOB: 1951-09-24 ? ? ?Referred by: Jearld Fenton, NP ?Reason for referral : Chronic Care Management (RNCM: Follow up for Chronic Disease Management and Care Coordination Needs) ? ? ?An unsuccessful telephone outreach was attempted today. The patient was referred to the case management team for assistance with care management and care coordination.  ? ?Follow Up Plan: The care management team will reach out to the patient again over the next 30 days.  ? ?Noreene Larsson RN, MSN, CCM ?Community Care Coordinator ?[facility] ?[facility] ?Mobile: [phone]  ?

## 2022-02-03 ENCOUNTER — Telehealth: Payer: Self-pay | Admitting: *Deleted

## 2022-02-03 NOTE — Telephone Encounter (Signed)
Patient called in today states she is burning with urination . I offered her an appt to come in and see a PA or she can be seen in [facility] tomorrow but patient states she cant come in .  ?

## 2022-02-05 DIAGNOSIS — B351 Tinea unguium: Secondary | ICD-10-CM | POA: Diagnosis not present

## 2022-02-05 DIAGNOSIS — E1142 Type 2 diabetes mellitus with diabetic polyneuropathy: Secondary | ICD-10-CM | POA: Diagnosis not present

## 2022-02-05 DIAGNOSIS — M79675 Pain in left toe(s): Secondary | ICD-10-CM | POA: Diagnosis not present

## 2022-02-05 DIAGNOSIS — M79674 Pain in right toe(s): Secondary | ICD-10-CM | POA: Diagnosis not present

## 2022-02-09 ENCOUNTER — Other Ambulatory Visit: Payer: Self-pay | Admitting: Internal Medicine

## 2022-02-09 DIAGNOSIS — I10 Essential (primary) hypertension: Secondary | ICD-10-CM

## 2022-02-10 ENCOUNTER — Encounter: Payer: Self-pay | Admitting: Internal Medicine

## 2022-02-10 ENCOUNTER — Ambulatory Visit (INDEPENDENT_AMBULATORY_CARE_PROVIDER_SITE_OTHER): Payer: Medicare HMO | Admitting: Internal Medicine

## 2022-02-10 VITALS — BP 136/84 | HR 80 | Temp 96.9°F | Ht 64.0 in | Wt 220.0 lb

## 2022-02-10 DIAGNOSIS — Z1231 Encounter for screening mammogram for malignant neoplasm of breast: Secondary | ICD-10-CM

## 2022-02-10 DIAGNOSIS — Z0001 Encounter for general adult medical examination with abnormal findings: Secondary | ICD-10-CM

## 2022-02-10 DIAGNOSIS — Z6837 Body mass index (BMI) 37.0-37.9, adult: Secondary | ICD-10-CM | POA: Diagnosis not present

## 2022-02-10 DIAGNOSIS — Z1159 Encounter for screening for other viral diseases: Secondary | ICD-10-CM | POA: Diagnosis not present

## 2022-02-10 DIAGNOSIS — Z780 Asymptomatic menopausal state: Secondary | ICD-10-CM

## 2022-02-10 DIAGNOSIS — E1165 Type 2 diabetes mellitus with hyperglycemia: Secondary | ICD-10-CM

## 2022-02-10 MED ORDER — ASPIRIN 81 MG PO TBEC: 81.0000 mg | DELAYED_RELEASE_TABLET | Freq: Every day | ORAL | 12 refills | Status: AC

## 2022-02-10 NOTE — Assessment & Plan Note (Signed)
Encourage diet and exercise for weight loss 

## 2022-02-10 NOTE — Progress Notes (Signed)
? ?Subjective:  ? ? Patient ID: Rachel Nielsen, female    DOB: May 13, 1951, 71 y.o.   MRN: [id number] ? ?HPI ? ?Patient presents to clinic today for her annual exam. ? ?Flu: 10/2020 ?Tetanus: unsure ?COVID: never ?Pneumovax: Never ?Prevnar: 07/2021 ?Shingrix: never ?Pap smear: Hysterectomy ?Mammogram: 04/2020 ?Bone density: 03/2009 ?Colon screening: 07/2018 ?Vision screening: annually ?Dentist: as needed ? ?Diet: She does eat meat. She consumes fruits and veggies. She does eat some fried foods. She drinks mostly dt. Soda and water. ?Exercise: None ? ?Review of Systems ? ?   ?Past Medical History:  ?Diagnosis Date  ? Allergy   ? Anxiety   ? Asthma   ? Colon polyps   ? Constipation   ? COPD (chronic obstructive pulmonary disease) ([place])   ? Depression   ? GERD (gastroesophageal reflux disease)   ? Glaucoma   ? Heart attack ([place])   ? Hypertension   ? Sleep apnea   ? Sleep apnea   ? Stroke [facility])   ? Urinary incontinence   ? ? ?Current Outpatient Medications  ?Medication Sig Dispense Refill  ? ACCU-CHEK GUIDE test strip Use to check blood sugar up to twice a day as directed 100 strip 3  ? Accu-Chek Softclix Lancets lancets Use to check blood sugar up to twice a day as directed 100 each 3  ? albuterol (PROVENTIL) (2.5 MG/3ML) 0.083% nebulizer solution Inhale 3 mLs (2.5 mg total) into the lungs every 4 (four) hours as needed for wheezing or shortness of breath. 75 mL 12  ? albuterol (VENTOLIN HFA) 108 (90 Base) MCG/ACT inhaler Inhale 2-4 puffs by mouth every 4 hours as needed for wheezing, cough, and/or shortness of breath 6.7 g 0  ? ALPRAZolam (XANAX) 1 MG tablet Take 1 mg by mouth 3 (three) times daily as needed.    ? amLODipine (NORVASC) 5 MG tablet Take 1 tablet (5 mg total) by mouth daily. 90 tablet 0  ? asenapine (SAPHRIS) 5 MG SUBL 24 hr tablet Place 5 mg under the tongue every evening.    ? bisoprolol (ZEBETA) 10 MG tablet Take 1 tablet (10 mg total) by mouth daily. OFFICE VISIT NEEDED FOR ADDITIONAL REFILLS 90 tablet 1  ?  buPROPion (WELLBUTRIN XL) 300 MG 24 hr tablet Take 1 tablet (300 mg total) by mouth daily. 30 tablet 0  ? busPIRone (BUSPAR) 15 MG tablet Take 15 mg by mouth 2 (two) times daily.     ? docusate sodium (COLACE) 100 MG capsule Take 100 mg by mouth daily.    ? Dulaglutide 3 MG/0.5ML SOPN Inject 0.5 mLs (3 mg total) subcutaneously once a week    ? FARXIGA 10 MG TABS tablet Take 10 mg by mouth daily.    ? fluticasone (FLONASE) 50 MCG/ACT nasal spray Place 1 spray into both nostrils daily. 16 g 0  ? fluticasone-salmeterol (ADVAIR) 250-50 MCG/ACT AEPB Inhale 1 puff into the lungs 2 (two) times daily.    ? gabapentin (NEURONTIN) 600 MG tablet Take 600-900 mg by mouth 3 (three) times daily. 600 mg AM, 600 mg pm and 900 mg at bedtime    ? GLOBAL EASE INJECT PEN NEEDLES 32G X 4 MM MISC Use as directed with insulin 100 each 0  ? insulin aspart (NOVOLOG) 100 UNIT/ML injection Inject 16 Units into the skin 3 (three) times daily before meals.    ? insulin glargine (LANTUS) 100 UNIT/ML injection Inject 0.36 mLs (36 Units total) into the skin daily. (Patient taking differently: Inject  56 Units into the skin at bedtime.) 10 mL 2  ? Insulin Pen Needle (BD PEN NEEDLE NANO U/F) 32G X 4 MM MISC Inject 1 Device into the skin daily. 100 each 3  ? Insulin Syringe-Needle U-100 (INSULIN SYRINGE 1CC/31GX5/16") 31G X 5/16" 1 ML MISC 1 Syringe by Does not apply route in the morning, at noon, in the evening, and at bedtime. 200 each 3  ? LATUDA 40 MG TABS tablet Take 40 mg by mouth at bedtime.    ? LINZESS 290 MCG CAPS capsule Take 1 capsule (290 mcg total) by mouth daily before breakfast. 90 capsule 3  ? metFORMIN (GLUCOPHAGE) 1000 MG tablet Take by mouth.    ? mirabegron ER (MYRBETRIQ) 50 MG TB24 tablet Take 50 mg by mouth daily.    ? mirabegron ER (MYRBETRIQ) 50 MG TB24 tablet Take 1 tablet (50 mg total) by mouth daily. 30 tablet 11  ? montelukast (SINGULAIR) 10 MG tablet Take 1 tablet (10 mg total) by mouth daily. 30 tablet 0  ? Olopatadine  HCl 0.2 % SOLN Place 1 drop into both eyes daily. 2.5 mL 0  ? omeprazole (PRILOSEC) 40 MG capsule Take 1 capsule (40 mg total) by mouth in the morning and at bedtime. 180 capsule 0  ? predniSONE (DELTASONE) 5 MG tablet Take 1 tablet (5 mg total) by mouth once daily    ? pregabalin (LYRICA) 25 MG capsule Take by mouth.    ? rosuvastatin (CRESTOR) 10 MG tablet Take 1 tablet (10 mg total) by mouth daily. 90 tablet 0  ? traMADol (ULTRAM) 50 MG tablet Take 1 tablet by mouth 2 (two) times daily.    ? TRAVATAN Z 0.004 % SOLN ophthalmic solution Place 1 drop into both eyes at bedtime. 5 mL 0  ? ?No current facility-administered medications for this visit.  ? ? ?Allergies  ?Allergen Reactions  ? Ace Inhibitors Cough  ? ? ?Family History  ?Problem Relation Age of Onset  ? Hypertension Mother   ? Atrial fibrillation Mother   ? Heart attack Mother   ? Hypertension Father   ? Stomach cancer Father   ? Diabetes Father   ? Liver cancer Father   ? Diabetes Sister   ? Hypertension Sister   ? ? ?Social History  ? ?Socioeconomic History  ? Marital status: Legally Separated  ?  Spouse name: Not on file  ? Number of children: 4  ? Years of education: Not on file  ? Highest education level: Not on file  ?Occupational History  ? Occupation: retired  ?Tobacco Use  ? Smoking status: Former  ? Smokeless tobacco: Never  ? Tobacco comments:  ?  4 months ago  ?Vaping Use  ? Vaping Use: Some days  ? Substances: Nicotine, Flavoring  ?Substance and Sexual Activity  ? Alcohol use: Not Currently  ?  Comment: monthly or less   ? Drug use: No  ? Sexual activity: Not on file  ?Other Topics Concern  ? Not on file  ?Social History Narrative  ? Not on file  ? ?Social Determinants of Health  ? ?Financial Resource Strain: Low Risk   ? Difficulty of Paying Living Expenses: Not hard at all  ?Food Insecurity: No Food Insecurity  ? Worried About Charity fundraiser in the Last Year: Never true  ? Ran Out of Food in the Last Year: Never true  ?Transportation  Needs: No Transportation Needs  ? Lack of Transportation (Medical): No  ? Lack of  Transportation (Non-Medical): No  ?Physical Activity: Inactive  ? Days of Exercise per Week: 0 days  ? Minutes of Exercise per Session: 0 min  ?Stress: No Stress Concern Present  ? Feeling of Stress : Not at all  ?Social Connections: Socially Isolated  ? Frequency of Communication with Friends and Family: More than three times a week  ? Frequency of Social Gatherings with Friends and Family: More than three times a week  ? Attends Religious Services: Never  ? Active Member of Clubs or Organizations: No  ? Attends Archivist Meetings: Never  ? Marital Status: Separated  ?Intimate Partner Violence: Not At Risk  ? Fear of Current or Ex-Partner: No  ? Emotionally Abused: No  ? Physically Abused: No  ? Sexually Abused: No  ? ? ? ?Constitutional: Pt reports fatigue. Denies fever, malaise, headache or abrupt weight changes.  ?HEENT: Denies eye pain, eye redness, ear pain, ringing in the ears, wax buildup, runny nose, nasal congestion, bloody nose, or sore throat. ?Respiratory: Pt reports shortness of breath. Denies difficulty breathing, cough or sputum production.   ?Cardiovascular: Pt reports swelling in her legs. Denies chest pain, chest tightness, palpitations or swelling in the hands.  ?Gastrointestinal: Patient reports intermittent constipation.  Denies abdominal pain, bloating, diarrhea or blood in the stool.  ?GU: Pt reports stress and urge incontinence. Denies frequency, pain with urination, burning sensation, blood in urine, odor or discharge. ?Musculoskeletal: Patient reports chronic neck and back pain.  Denies decrease in range of motion, difficulty with gait, or joint swelling.  ?Skin: Denies redness, rashes, lesions or ulcercations.  ?Neurological: Patient reports neuropathic pain.  Denies dizziness, difficulty with memory, difficulty with speech or problems with balance and coordination.  ?Psych: Patient has a history  of anxiety and depression.  Denies SI/HI. ? ?No other specific complaints in a complete review of systems (except as listed in HPI above). ? ?Dr. Kasandra Knudsen at [facility]- follow up unknown. Therap

## 2022-02-10 NOTE — Patient Instructions (Signed)
Health Maintenance After Age 69 After age 69, you are at a higher risk for certain long-term diseases and infections as well as injuries from falls. Falls are a major cause of broken bones and head injuries in people who are older than age 69. Getting regular preventive care can help to keep you healthy and well. Preventive care includes getting regular testing and making lifestyle changes as recommended by your health care provider. Talk with your health care provider about: Which screenings and tests you should have. A screening is a test that checks for a disease when you have no symptoms. A diet and exercise plan that is right for you. What should I know about screenings and tests to prevent falls? Screening and testing are the best ways to find a health problem early. Early diagnosis and treatment give you the best chance of managing medical conditions that are common after age 69. Certain conditions and lifestyle choices may make you more likely to have a fall. Your health care provider may recommend: Regular vision checks. Poor vision and conditions such as cataracts can make you more likely to have a fall. If you wear glasses, make sure to get your prescription updated if your vision changes. Medicine review. Work with your health care provider to regularly review all of the medicines you are taking, including over-the-counter medicines. Ask your health care provider about any side effects that may make you more likely to have a fall. Tell your health care provider if any medicines that you take make you feel dizzy or sleepy. Strength and balance checks. Your health care provider may recommend certain tests to check your strength and balance while standing, walking, or changing positions. Foot health exam. Foot pain and numbness, as well as not wearing proper footwear, can make you more likely to have a fall. Screenings, including: Osteoporosis screening. Osteoporosis is a condition that causes  the bones to get weaker and break more easily. Blood pressure screening. Blood pressure changes and medicines to control blood pressure can make you feel dizzy. Depression screening. You may be more likely to have a fall if you have a fear of falling, feel depressed, or feel unable to do activities that you used to do. Alcohol use screening. Using too much alcohol can affect your balance and may make you more likely to have a fall. Follow these instructions at home: Lifestyle Do not drink alcohol if: Your health care provider tells you not to drink. If you drink alcohol: Limit how much you have to: 0-1 drink a day for women. 0-2 drinks a day for men. Know how much alcohol is in your drink. In the [place] one drink equals one 12 oz bottle of beer (355 mL), one 5 oz glass of wine (148 mL), or one 1 oz glass of hard liquor (44 mL). Do not use any products that contain nicotine or tobacco. These products include cigarettes, chewing tobacco, and vaping devices, such as e-cigarettes. If you need help quitting, ask your health care provider. Activity  Follow a regular exercise program to stay fit. This will help you maintain your balance. Ask your health care provider what types of exercise are appropriate for you. If you need a cane or walker, use it as recommended by your health care provider. Wear supportive shoes that have nonskid soles. Safety  Remove any tripping hazards, such as rugs, cords, and clutter. Install safety equipment such as grab bars in bathrooms and safety rails on stairs. Keep rooms and walkways   well-lit. General instructions Talk with your health care provider about your risks for falling. Tell your health care provider if: You fall. Be sure to tell your health care provider about all falls, even ones that seem minor. You feel dizzy, tiredness (fatigue), or off-balance. Take over-the-counter and prescription medicines only as told by your health care provider. These include  supplements. Eat a healthy diet and maintain a healthy weight. A healthy diet includes low-fat dairy products, low-fat (lean) meats, and fiber from whole grains, beans, and lots of fruits and vegetables. Stay current with your vaccines. Schedule regular health, dental, and eye exams. Summary Having a healthy lifestyle and getting preventive care can help to protect your health and wellness after age 69. Screening and testing are the best way to find a health problem early and help you avoid having a fall. Early diagnosis and treatment give you the best chance for managing medical conditions that are more common for people who are older than age 69. Falls are a major cause of broken bones and head injuries in people who are older than age 69. Take precautions to prevent a fall at home. Work with your health care provider to learn what changes you can make to improve your health and wellness and to prevent falls. This information is not intended to replace advice given to you by your health care provider. Make sure you discuss any questions you have with your health care provider. Document Revised: 03/10/2021 Document Reviewed: 03/10/2021 Elsevier Patient Education  2022 [employer]vier Inc.  

## 2022-02-10 NOTE — Telephone Encounter (Signed)
Requested Prescriptions  ?Pending Prescriptions Disp Refills  ?? bisoprolol (ZEBETA) 10 MG tablet [Pharmacy Med Name: BISOPROLOL FUMARATE 10 MG TAB] 90 tablet 0  ?  Sig: Take 1 tablet (10 mg total) by mouth daily. OFFICE VISIT NEEDED FOR ADDITIONAL REFILLS  ?  ? Cardiovascular: Beta Blockers 2 Passed - 02/09/2022 12:38 PM  ?  ?  Passed - Cr in normal range and within 360 days  ?  Creatinine, Ser  ?Date Value Ref Range Status  ?12/22/2021 0.81 0.44 - 1.00 mg/dL Final  ?   ?  ?  Passed - Last BP in normal range  ?  BP Readings from Last 1 Encounters:  ?02/10/22 136/84  ?   ?  ?  Passed - Last Heart Rate in normal range  ?  Pulse Readings from Last 1 Encounters:  ?02/10/22 80  ?   ?  ?  Passed - Valid encounter within last 6 months  ?  Recent Outpatient Visits   ?      ? Today Encounter for general adult medical examination with abnormal findings  ? [facility] [place], [place] W, NP  ? 1 month ago COPD exacerbation [facility])  ? [facility] [place], [place], NP  ? 7 months ago Mixed diabetic hyperlipidemia associated with type 2 diabetes mellitus ([place])  ? [facility] [place], [place] W, NP  ? 1 year ago Uncontrolled type 2 diabetes mellitus with hyperglycemia ([place])  ? [place], DO  ? 1 year ago Encounter to establish care with new doctor  ? [facility], Lupita Raider, FNP  ?  ?  ?Future Appointments   ?        ? In 3 months Baity, Coralie Keens, NP [facility], [place]  ? In 31 months MacDiarmid, Nicki Reaper, MD [place]  ?  ? ?  ?  ?  ? ?

## 2022-02-11 LAB — HEPATITIS C ANTIBODY
Hepatitis C Ab: NONREACTIVE
SIGNAL TO CUT-OFF: 0.23 (ref <1.00)

## 2022-02-11 LAB — CBC
HCT: 41.5 % (ref 35.0–45.0)
Hemoglobin: 13 g/dL (ref 11.7–15.5)
MCH: 25.7 pg — ABNORMAL LOW (ref 27.0–33.0)
MCHC: 31.3 g/dL — ABNORMAL LOW (ref 32.0–36.0)
MCV: 82.2 fL (ref 80.0–100.0)
MPV: 12.3 fL (ref 7.5–12.5)
Platelets: 202 10*3/uL (ref 140–400)
RBC: 5.05 10*6/uL (ref 3.80–5.10)
RDW: 14.7 % (ref 11.0–15.0)
WBC: 11.6 10*3/uL — ABNORMAL HIGH (ref 3.8–10.8)

## 2022-02-11 LAB — COMPLETE METABOLIC PANEL WITH GFR
AG Ratio: 1.4 (calc) (ref 1.0–2.5)
ALT: 18 U/L (ref 6–29)
AST: 13 U/L (ref 10–35)
Albumin: 3.8 g/dL (ref 3.6–5.1)
Alkaline phosphatase (APISO): 90 U/L (ref 37–153)
BUN: 19 mg/dL (ref 7–25)
CO2: 24 mmol/L (ref 20–32)
Calcium: 9.6 mg/dL (ref 8.6–10.4)
Chloride: 103 mmol/L (ref 98–110)
Creat: 0.94 mg/dL (ref 0.50–1.05)
Globulin: 2.8 g/dL (calc) (ref 1.9–3.7)
Glucose, Bld: 186 mg/dL — ABNORMAL HIGH (ref 65–139)
Potassium: 4.2 mmol/L (ref 3.5–5.3)
Sodium: 141 mmol/L (ref 135–146)
Total Bilirubin: 0.4 mg/dL (ref 0.2–1.2)
Total Protein: 6.6 g/dL (ref 6.1–8.1)
eGFR: 66 mL/min/{1.73_m2} (ref >=60)

## 2022-02-11 LAB — MICROALBUMIN / CREATININE URINE RATIO
Creatinine, Urine: 43 mg/dL (ref 20–275)
Microalb Creat Ratio: 28 mcg/mg creat (ref <30)
Microalb, Ur: 1.2 mg/dL

## 2022-02-11 LAB — LIPID PANEL
Cholesterol: 104 mg/dL (ref <200)
HDL: 58 mg/dL (ref >=50)
LDL Cholesterol (Calc): 26 mg/dL (calc)
Non-HDL Cholesterol (Calc): 46 mg/dL (calc) (ref <130)
Total CHOL/HDL Ratio: 1.8 (calc) (ref <5.0)
Triglycerides: 112 mg/dL (ref <150)

## 2022-02-13 ENCOUNTER — Other Ambulatory Visit: Payer: Self-pay | Admitting: Internal Medicine

## 2022-02-13 NOTE — Telephone Encounter (Signed)
Requested medication (s) are due for refill today: yes ? ?Requested medication (s) are on the active medication list: yes ? ?Last refill:  01/14/22 #16 g 0 refills ? ?Future visit scheduled: yes in 2 months ? ?Notes to clinic:  not delegated per protocol.  ? ? ?  ?Requested Prescriptions  ?Pending Prescriptions Disp Refills  ? fluticasone (FLONASE) 50 MCG/ACT nasal spray [Pharmacy Med Name: FLUTICASONE PROP 50 MCG SPRAY] 16 g 0  ?  Sig: Place 1 spray into both nostrils daily.  ?  ? Not Delegated - Ear, Nose, and Throat: Nasal Preparations - Corticosteroids Failed - 02/13/2022  1:03 PM  ?  ?  Failed - This refill cannot be delegated  ?  ?  Passed - Valid encounter within last 12 months  ?  Recent Outpatient Visits   ? ?      ? 3 days ago Encounter for general adult medical examination with abnormal findings  ? [facility] [place], [place] W, NP  ? 1 month ago COPD exacerbation [facility])  ? [facility] [place], [place] W, NP  ? 7 months ago Mixed diabetic hyperlipidemia associated with type 2 diabetes mellitus ([place])  ? [facility] [place], [place] W, NP  ? 1 year ago Uncontrolled type 2 diabetes mellitus with hyperglycemia ([place])  ? [place], DO  ? 1 year ago Encounter to establish care with new doctor  ? [facility], Lupita Raider, FNP  ? ?  ?  ?Future Appointments   ? ?        ? In 2 months Baity, Coralie Keens, NP [facility], [place]  ? In 50 months MacDiarmid, Nicki Reaper, MD [place]  ? ?  ? ?  ?  ?  ? ?

## 2022-02-15 DIAGNOSIS — F419 Anxiety disorder, unspecified: Secondary | ICD-10-CM | POA: Diagnosis not present

## 2022-02-15 DIAGNOSIS — F321 Major depressive disorder, single episode, moderate: Secondary | ICD-10-CM | POA: Diagnosis not present

## 2022-02-23 ENCOUNTER — Telehealth: Payer: Medicare HMO

## 2022-02-23 ENCOUNTER — Ambulatory Visit (INDEPENDENT_AMBULATORY_CARE_PROVIDER_SITE_OTHER): Payer: Medicare HMO

## 2022-02-23 DIAGNOSIS — R32 Unspecified urinary incontinence: Secondary | ICD-10-CM

## 2022-02-23 DIAGNOSIS — M5416 Radiculopathy, lumbar region: Secondary | ICD-10-CM

## 2022-02-23 DIAGNOSIS — M5412 Radiculopathy, cervical region: Secondary | ICD-10-CM

## 2022-02-23 DIAGNOSIS — F411 Generalized anxiety disorder: Secondary | ICD-10-CM

## 2022-02-23 DIAGNOSIS — E1165 Type 2 diabetes mellitus with hyperglycemia: Secondary | ICD-10-CM

## 2022-02-23 DIAGNOSIS — Z9181 History of falling: Secondary | ICD-10-CM

## 2022-02-23 DIAGNOSIS — I10 Essential (primary) hypertension: Secondary | ICD-10-CM

## 2022-02-23 DIAGNOSIS — J432 Centrilobular emphysema: Secondary | ICD-10-CM

## 2022-02-23 DIAGNOSIS — F331 Major depressive disorder, recurrent, moderate: Secondary | ICD-10-CM

## 2022-02-23 NOTE — Chronic Care Management (AMB) (Signed)
?Chronic Care Management  ? ?CCM RN Visit Note ? ?02/23/2022 ?Name: Rachel Nielsen MRN: [id number] DOB: 1951/10/04 ? ?Subjective: ?Rachel Nielsen is a 71 y.o. year old female who is a primary care patient of Jearld Fenton, NP. The care management team was consulted for assistance with disease management and care coordination needs.   ? ?Engaged with patient by telephone for follow up visit in response to provider referral for case management and/or care coordination services.  ? ?Consent to Services:  ?The patient was given information about Chronic Care Management services, agreed to services, and gave verbal consent prior to initiation of services.  Please see initial visit note for detailed documentation.  ? ?Patient agreed to services and verbal consent obtained.  ? ?Assessment: Review of patient past medical history, allergies, medications, health status, including review of consultants reports, laboratory and other test data, was performed as part of comprehensive evaluation and provision of chronic care management services.  ? ?SDOH (Social Determinants of Health) assessments and interventions performed:   ? ?CCM Care Plan ? ?Allergies  ?Allergen Reactions  ? Ace Inhibitors Cough  ? ? ?Outpatient Encounter Medications as of 02/23/2022  ?Medication Sig  ? ACCU-CHEK GUIDE test strip Use to check blood sugar up to twice a day as directed  ? Accu-Chek Softclix Lancets lancets Use to check blood sugar up to twice a day as directed  ? albuterol (PROVENTIL) (2.5 MG/3ML) 0.083% nebulizer solution Inhale 3 mLs (2.5 mg total) into the lungs every 4 (four) hours as needed for wheezing or shortness of breath.  ? albuterol (VENTOLIN HFA) 108 (90 Base) MCG/ACT inhaler Inhale 2-4 puffs by mouth every 4 hours as needed for wheezing, cough, and/or shortness of breath  ? ALPRAZolam (XANAX) 1 MG tablet Take 1 mg by mouth 3 (three) times daily as needed.  ? amLODipine (NORVASC) 5 MG tablet Take 1 tablet (5 mg total) by mouth daily.   ? asenapine (SAPHRIS) 5 MG SUBL 24 hr tablet Place 5 mg under the tongue every evening.  ? aspirin 81 MG EC tablet Take 1 tablet (81 mg total) by mouth daily. Swallow whole.  ? bisoprolol (ZEBETA) 10 MG tablet Take 1 tablet (10 mg total) by mouth daily. OFFICE VISIT NEEDED FOR ADDITIONAL REFILLS  ? buPROPion (WELLBUTRIN XL) 300 MG 24 hr tablet Take 1 tablet (300 mg total) by mouth daily.  ? busPIRone (BUSPAR) 15 MG tablet Take 15 mg by mouth 2 (two) times daily.   ? docusate sodium (COLACE) 100 MG capsule Take 100 mg by mouth daily.  ? Dulaglutide 3 MG/0.5ML SOPN Inject 0.5 mLs (3 mg total) subcutaneously once a week  ? FARXIGA 10 MG TABS tablet Take 10 mg by mouth daily.  ? fluticasone (FLONASE) 50 MCG/ACT nasal spray Place 1 spray into both nostrils daily.  ? fluticasone-salmeterol (ADVAIR) 250-50 MCG/ACT AEPB Inhale 1 puff into the lungs 2 (two) times daily.  ? gabapentin (NEURONTIN) 600 MG tablet Take 600-900 mg by mouth 3 (three) times daily. 600 mg AM, 600 mg pm and 900 mg at bedtime  ? GLOBAL EASE INJECT PEN NEEDLES 32G X 4 MM MISC Use as directed with insulin  ? insulin aspart (NOVOLOG) 100 UNIT/ML injection Inject 16 Units into the skin 3 (three) times daily before meals.  ? insulin glargine (LANTUS) 100 UNIT/ML injection Inject 0.36 mLs (36 Units total) into the skin daily. (Patient taking differently: Inject 56 Units into the skin at bedtime.)  ? Insulin Pen Needle (BD  PEN NEEDLE NANO U/F) 32G X 4 MM MISC Inject 1 Device into the skin daily.  ? Insulin Syringe-Needle U-100 (INSULIN SYRINGE 1CC/31GX5/16") 31G X 5/16" 1 ML MISC 1 Syringe by Does not apply route in the morning, at noon, in the evening, and at bedtime.  ? LATUDA 40 MG TABS tablet Take 40 mg by mouth at bedtime.  ? LINZESS 290 MCG CAPS capsule Take 1 capsule (290 mcg total) by mouth daily before breakfast.  ? metFORMIN (GLUCOPHAGE) 1000 MG tablet Take by mouth.  ? mirabegron ER (MYRBETRIQ) 50 MG TB24 tablet Take 1 tablet (50 mg total) by  mouth daily.  ? montelukast (SINGULAIR) 10 MG tablet Take 1 tablet (10 mg total) by mouth daily.  ? Olopatadine HCl 0.2 % SOLN Place 1 drop into both eyes daily.  ? omeprazole (PRILOSEC) 40 MG capsule Take 1 capsule (40 mg total) by mouth in the morning and at bedtime.  ? predniSONE (DELTASONE) 5 MG tablet Take 1 tablet (5 mg total) by mouth once daily  ? pregabalin (LYRICA) 25 MG capsule Take by mouth.  ? rosuvastatin (CRESTOR) 10 MG tablet Take 1 tablet (10 mg total) by mouth daily.  ? TRAVATAN Z 0.004 % SOLN ophthalmic solution Place 1 drop into both eyes at bedtime.  ? ?No facility-administered encounter medications on file as of 02/23/2022.  ? ? ?Patient Active Problem List  ? Diagnosis Date Noted  ? Mixed diabetic hyperlipidemia associated with type 2 diabetes mellitus ([place]) 07/09/2021  ? Chronic constipation 07/09/2021  ? Cervical radiculopathy 07/09/2021  ? Lumbar radiculopathy 07/09/2021  ? Class 2 obesity due to excess calories with body mass index (BMI) of 37.0 to 37.9 in adult 07/09/2021  ? OSA (obstructive sleep apnea) 12/18/2020  ? Diabetic neuropathy ([place]) 12/17/2020  ? Centrilobular emphysema ([place]) 12/17/2020  ? GAD (generalized anxiety disorder) 12/17/2020  ? History of stroke 10/21/2020  ? Uncontrolled type 2 diabetes mellitus with hyperglycemia ([place]) 03/14/2019  ? Hypertension 12/21/2017  ? GERD (gastroesophageal reflux disease) 12/21/2017  ? Major depressive disorder, recurrent, moderate ([place]) 12/21/2017  ? ? ?Conditions to be addressed/monitored:HTN, HLD, COPD, DMII, Anxiety, Depression, Overactive Bladder, and Chronic pain, falls, and urinary incontinence with UTI's ? ?Care Plan : RNCM: General Plan of Care (Adult) for Chronic Disease Management and Care Coordination Needs  ?Updates made by Vanita Ingles, RN since 02/23/2022 12:00 AM  ?  ? ?Problem: RNCM: Development of Plan of Care for Chronic Disease Management (HTN, HLD, DM, COPD, Depression, Anxiety, Chronic Pain and Falls)   ?Priority: High   ?  ? ?Long-Range Goal: RNCM: Effective Management  of Plan of Care for Chronic Disease Management (HTN, HLD, DM, COPD, Depression, Anxiety, Chronic Pain and Falls)   ?Start Date: 01/12/2022  ?Expected End Date: 01/13/2023  ?Priority: High  ?Note:   ?Current Barriers:  ?Knowledge Deficits related to plan of care for management of HTN, HLD, COPD, DMII, Chronic Pain, and Depression, Anxiety, Falls prevention  ?Care Coordination needs related to Mental Health Concerns   ?Chronic Disease Management support and education needs related to HTN, HLD, COPD, DMII, Chronic Pain, and Depression, Anxiety, Falls prevention  ? ?RNCM Clinical Goal(s):  ?Patient will verbalize basic understanding of HTN, HLD, COPD, DMII, Anxiety, Depression, and Chronic pain and falls disease process and self health management plan as evidenced by routine office visits, lab testing, VS stable, compliance with the plan of care and dietary restrictions, and working with the CCM team to effectively manage health and well being.  ?  demonstrate understanding of rationale for each prescribed medication as evidenced by compliance with medications and calling for refills before running out of medications     ?attend all scheduled medical appointments: 05-13-2022 at 10 am as evidenced by keeping appointments and calling for schedule change needs         ?demonstrate improved and ongoing health management independence as evidenced by no exacerbations in chronic conditions, stable VS and labs, and effective management of chronic conditions        ?demonstrate ongoing self health care management ability effectively manage Chronic conditions  as evidenced by  working with the CCM team through collaboration with Consulting civil engineer, provider, and care team.  ? ?Interventions: ?1:1 collaboration with primary care provider regarding development and update of comprehensive plan of care as evidenced by provider attestation and co-signature ?Inter-disciplinary care team  collaboration (see longitudinal plan of care) ?Evaluation of current treatment plan related to  self management and patient's adherence to plan as established by provider ? ? ?COPD: (Status: Goal on Track (p

## 2022-02-23 NOTE — Patient Instructions (Signed)
Visit Information ? ?Thank you for taking time to visit with me today. Please don't hesitate to contact me if I can be of assistance to you before our next scheduled telephone appointment. ? ?Following are the goals we discussed today:  ?RNCM Clinical Goal(s):  ?Patient will verbalize basic understanding of HTN, HLD, COPD, DMII, Anxiety, Depression, and Chronic pain and falls disease process and self health management plan as evidenced by routine office visits, lab testing, VS stable, compliance with the plan of care and dietary restrictions, and working with the CCM team to effectively manage health and well being.  ?demonstrate understanding of rationale for each prescribed medication as evidenced by compliance with medications and calling for refills before running out of medications     ?attend all scheduled medical appointments: 05-13-2022 at 10 am as evidenced by keeping appointments and calling for schedule change needs         ?demonstrate improved and ongoing health management independence as evidenced by no exacerbations in chronic conditions, stable VS and labs, and effective management of chronic conditions        ?demonstrate ongoing self health care management ability effectively manage Chronic conditions  as evidenced by  working with the CCM team through collaboration with Consulting civil engineer, provider, and care team.  ?  ?Interventions: ?1:1 collaboration with primary care provider regarding development and update of comprehensive plan of care as evidenced by provider attestation and co-signature ?Inter-disciplinary care team collaboration (see longitudinal plan of care) ?Evaluation of current treatment plan related to  self management and patient's adherence to plan as established by provider ?  ?  ?COPD: (Status: Goal on Track (progressing): YES.) Long Term Goal  ?Reviewed medications with patient, including use of prescribed maintenance and rescue inhalers, and provided instruction on medication  management and the importance of adherence. 02-23-2022: The patient is compliant with medications. ?Provided patient with basic written and verbal COPD education on self care/management/and exacerbation prevention ?Advised patient to track and manage COPD triggers. 02-23-2022: Review of factors that contribute to increase risk of exacerbations of COPD. ?Provided written and verbal instructions on pursed lip breathing and utilized returned demonstration as teach back ?Provided instruction about proper use of medications used for management of COPD including inhalers ?Advised patient to self assesses COPD action plan zone and make appointment with provider if in the yellow zone for 48 hours without improvement ?Advised patient to engage in light exercise as tolerated 3-5 days a week to aid in the the management of COPD ?Provided education about and advised patient to utilize infection prevention strategies to reduce risk of respiratory infection. 02-23-2022: Review of increase risk factors for infections and staying safe from potential infections. ?Discussed the importance of adequate rest and management of fatigue with COPD ?  ?Diabetes:  (Status: Goal on Track (progressing): YES.) Long Term Goal  ?  ?     ?Lab Results  ?Component Value Date  ?  HGBA1C 9.0 (H) 12/21/2021  ?  ?Assessed patient's understanding of A1c goal: <7%. 02-23-2022: Review of the goal for <7.0% ?Provided education to patient about basic DM disease process. 02-23-2022: Review and support given; ?Reviewed medications with patient and discussed importance of medication adherence. 01-12-2022: The patient with recent changes in her medications with the endocrinologist. The patient states she has a good understanding of how to take her medications. Education and support given. 02-23-2022: The patient is taking medication as directed.         ?Reviewed prescribed diet with patient  heart healthy/ADA diet. 02-23-2022: Review of  heart healthy/ADA diet.  ; ?Counseled on importance of regular laboratory monitoring as prescribed. 02-23-2022: Review of monitoring for A1C levels on a consistent basis. ;        ?Discussed plans with patient for ongoing care management follow up and provided patient with direct contact information for care management team;      ?Provided patient with written educational materials related to hypo and hyperglycemia and importance of correct treatment. 01-12-2022: The patient denies any lows but states her blood sugars have been in the 200's, they were in the 200's but endocrinologist made new changes recently. 02-22-2022: Is taking prednisone and states that her blood sugars have been elevated but she is compensating with monitoring her dietary intake and watching what she eats.        ?Reviewed scheduled/upcoming provider appointments including: 05-13-2022 at 10 am;         ?Advised patient, providing education and rationale, to check cbg before meals and at bedtime and record. 02-23-2022: The patient states that she takes her blood sugars BID. She states that they are doing better and states that her average is about 200.She is mindful when taking prednisone that her levels are higher but she is monitoring what she eats better. Review of goal of blood sugars fasting is <130 and post prandial is <180.     ?call provider for findings outside established parameters;       ?Referral made to pharmacy team for assistance with medications management and reconciliation. 02-23-2022: The patient has ongoing support and education from the pharm D.        ?Review of patient status, including review of consultants reports, relevant laboratory and other test results, and medications completed;       ?Advised patient to discuss changes in DM plan of care, new questions or concerns with provider;      ?  ?Falls:  (Status: Goal on Track (progressing): YES.) Long Term Goal  ?Provided written and verbal education re: potential causes of falls and Fall prevention  strategies ?Reviewed medications and discussed potential side effects of medications such as dizziness and frequent urination ?Advised patient of importance of notifying provider of falls. 02-23-2022: Advised the patient to let the office know of any new falls.  ?Assessed for signs and symptoms of orthostatic hypotension. 01-12-2022: The patient denies any issues with orthostatic hypotension  ?Assessed for falls since last encounter. 01-12-2022: The patient states that she loses her balance a lot. Last known fall was 12-04-2021. The patient uses a cane and also has a walker. Review of safety and fall precautions. 02-23-2022: Denies any new falls at this time. Will continue to monitor for changes ?Assessed patients knowledge of fall risk prevention secondary to previously provided education ?Provided patient information for fall alert systems ?Assessed working status of life alert bracelet and patient adherence ?Advised patient to discuss new falls and safety concerns with provider ?  ?Anxiety and Depression   (Status: Goal on Track (progressing): YES.) Long Term Goal  ?Evaluation of current treatment plan related to Anxiety and Depression, Mental Health Concerns  self-management and patient's adherence to plan as established by provider. 01-26-2022: The patient is having pain in her neck and hands and this is causing her to have more anxiety and depression. She left a VM after hours on 01-23-2022 for the [facility] asking for assistance with her provider who gave her injections. The patient was headed to an appointment but was able to take  the information down as she was not driving. She will follow up with Dr. Alba Destine for new injections if possible. 02-23-2022: The patient states that she is hurting a lot today and she will be glad when she can get her injection of for pain. She says she is having an okay day. She has a lot of chronic conditions impacting her care. She states that she will continue to follow the plan of care. She  is thankful for the support of the team. Will continue to monitor for changes.  ?Discussed plans with patient for ongoing care management follow up and provided patient with direct contact information for care man

## 2022-02-27 ENCOUNTER — Ambulatory Visit: Payer: Medicare HMO | Admitting: Pharmacist

## 2022-02-27 DIAGNOSIS — E1165 Type 2 diabetes mellitus with hyperglycemia: Secondary | ICD-10-CM

## 2022-02-27 DIAGNOSIS — I10 Essential (primary) hypertension: Secondary | ICD-10-CM

## 2022-02-27 DIAGNOSIS — J432 Centrilobular emphysema: Secondary | ICD-10-CM

## 2022-02-27 NOTE — Patient Instructions (Signed)
Visit Information ? ?Thank you for taking time to visit with me today. Please don't hesitate to contact me if I can be of assistance to you before our next scheduled telephone appointment. ? ?Following are the goals we discussed today:  ? Goals Addressed   ? ?  ?  ?  ?  ? This Visit's Progress  ?  Pharmacy Goals     ?  Our goal A1c is less than 7%. This corresponds with fasting sugars less than 130 and 2 hour after meal sugars less than 180. Please check your blood sugar and keep a log of the results ? ?Our goal bad cholesterol, or LDL, is less than 70 . This is why it is important to continue taking your rosuvastatin. ? ?Feel free to call me with any questions or concerns. I look forward to our next call! ? ?Wallace Cullens, PharmD, BCACP, CPP ?Clinical Pharmacist ?[facility] ?[place] ?[phone] ?  ? ?  ? ? ? ?Our next appointment is by telephone on 6/7 at 1 pm ? ?Please call the care guide team at [phone] if you need to cancel or reschedule your appointment.  ? ? ?Patient verbalizes understanding of instructions and care plan provided today and agrees to view in [place]. Active MyChart status confirmed with patient.   ? ?

## 2022-02-27 NOTE — Chronic Care Management (AMB) (Signed)
? ?Chronic Care Management ?CCM Pharmacy Note ? ?02/27/2022 ?Name:  Rachel Nielsen MRN:  [id number] DOB:  [birth year] ? ? ?Subjective: ?Rachel Nielsen is an 71 y.o. year old female who is a primary patient of Jearld Fenton, NP.  The CCM team was consulted for assistance with disease management and care coordination needs.   ? ?Engaged with patient by telephone for follow up visit for pharmacy case management and/or care coordination services.  ? ?Objective: ? ?Medications Reviewed Today   ? ? Reviewed by Jearld Fenton, NP (Nurse Practitioner) on 02/10/22 at 1054  Med List Status: <None>  ? ?Medication Order Taking? Sig Documenting Provider Last Dose Status Informant  ?ACCU-CHEK GUIDE test strip [id number] Yes Use to check blood sugar up to twice a day as directed Jearld Fenton, NP Taking Active Self  ?Accu-Chek Softclix Lancets lancets [id number] Yes Use to check blood sugar up to twice a day as directed Jearld Fenton, NP Taking Active Self  ?albuterol (PROVENTIL) (2.5 MG/3ML) 0.083% nebulizer solution [id number] Yes Inhale 3 mLs (2.5 mg total) into the lungs every 4 (four) hours as needed for wheezing or shortness of breath. Lorella Nimrod, MD Taking Active   ?albuterol (VENTOLIN HFA) 108 (90 Base) MCG/ACT inhaler [id number] Yes Inhale 2-4 puffs by mouth every 4 hours as needed for wheezing, cough, and/or shortness of breath Malfi, Lupita Raider, FNP Taking Active Multiple Informants, Self  ?ALPRAZolam (XANAX) 1 MG tablet [id number] Yes Take 1 mg by mouth 3 (three) times daily as needed. [provider] Taking Active Multiple Informants, Self  ?amLODipine (NORVASC) 5 MG tablet [id number] Yes Take 1 tablet (5 mg total) by mouth daily. Jearld Fenton, NP Taking Active   ?asenapine (SAPHRIS) 5 MG SUBL 24 hr tablet [id number] Yes Place 5 mg under the tongue every evening. [provider] Taking Active Multiple Informants, Self  ?bisoprolol (ZEBETA) 10 MG tablet [id number] Yes Take 1 tablet (10 mg total) by  mouth daily. OFFICE VISIT NEEDED FOR ADDITIONAL REFILLS Garnette Gunner Coralie Keens, NP Taking Active Multiple Informants, Self  ?buPROPion (WELLBUTRIN XL) 300 MG 24 hr tablet [id number] Yes Take 1 tablet (300 mg total) by mouth daily. Carrie Mew, MD Taking Active Multiple Informants, Self  ?busPIRone (BUSPAR) 15 MG tablet [id number] Yes Take 15 mg by mouth 2 (two) times daily.  [provider] Taking Active Multiple Informants, Self  ?docusate sodium (COLACE) 100 MG capsule [id number] Yes Take 100 mg by mouth daily. [provider] Taking Active   ?Dulaglutide 3 MG/0.5ML SOPN [id number] Yes Inject 0.5 mLs (3 mg total) subcutaneously once a week [provider] Taking Active   ?FARXIGA 10 MG TABS tablet [id number] Yes Take 10 mg by mouth daily. [provider] Taking Active Multiple Informants, Self  ?fluticasone (FLONASE) 50 MCG/ACT nasal spray [id number] Yes Place 1 spray into both nostrils daily. Jearld Fenton, NP Taking Active   ?fluticasone-salmeterol (ADVAIR) 250-50 MCG/ACT AEPB [id number] Yes Inhale 1 puff into the lungs 2 (two) times daily. [provider] Taking Active Multiple Informants, Self  ?gabapentin (NEURONTIN) 600 MG tablet [id number] Yes Take 600-900 mg by mouth 3 (three) times daily. 600 mg AM, 600 mg pm and 900 mg at bedtime [provider] Taking Active Multiple Informants, Self  ?GLOBAL EASE INJECT PEN NEEDLES 32G X 4 MM MISC [id number] Yes Use as directed with insulin Kendell Bane, NP Taking Active Multiple Informants, Self  ?insulin aspart (NOVOLOG) 100 UNIT/ML injection [id number] Yes Inject 16 Units into  the skin 3 (three) times daily before meals. [provider] Taking Active Multiple Informants, Self  ?insulin glargine (LANTUS) 100 UNIT/ML injection [id number] Yes Inject 0.36 mLs (36 Units total) into the skin daily.  ?Patient taking differently: Inject 56 Units into the skin at bedtime.  ? Jearld Fenton, NP Taking Active Multiple  Informants, Self  ?Insulin Pen Needle (BD PEN NEEDLE NANO U/F) 32G X 4 MM MISC [id number] Yes Inject 1 Device into the skin daily. Jearld Fenton, NP Taking Active Multiple Informants, Self  ?Insulin Syringe-Needle U-100 (INSULIN SYRINGE 1CC/31GX5/16") 31G X 5/16" 1 ML MISC [id number] Yes 1 Syringe by Does not apply route in the morning, at noon, in the evening, and at bedtime. Jearld Fenton, NP Taking Active Multiple Informants, Self  ?LATUDA 40 MG TABS tablet [id number] Yes Take 40 mg by mouth at bedtime. [provider] Taking Active Multiple Informants, Self  ?LINZESS 290 MCG CAPS capsule [id number] Yes Take 1 capsule (290 mcg total) by mouth daily before breakfast. Jearld Fenton, NP Taking Active   ?metFORMIN (GLUCOPHAGE) 1000 MG tablet [id number] Yes Take by mouth. [provider] Taking Active   ?mirabegron ER (MYRBETRIQ) 50 MG TB24 tablet [id number] Yes Take 50 mg by mouth daily. [provider] Taking Consider Medication Status and Discontinue (Duplicate)   ?mirabegron ER (MYRBETRIQ) 50 MG TB24 tablet [id number] Yes Take 1 tablet (50 mg total) by mouth daily. Bjorn Loser, MD Taking Active   ?montelukast (SINGULAIR) 10 MG tablet [id number] Yes Take 1 tablet (10 mg total) by mouth daily. Carrie Mew, MD Taking Active Multiple Informants, Self  ?Olopatadine HCl 0.2 % SOLN [id number] Yes Place 1 drop into both eyes daily. Malfi, Lupita Raider, FNP Taking Active Multiple Informants, Self  ?omeprazole (PRILOSEC) 40 MG capsule [id number] Yes Take 1 capsule (40 mg total) by mouth in the morning and at bedtime. Jearld Fenton, NP Taking Active   ?predniSONE (DELTASONE) 5 MG tablet [id number] Yes Take 1 tablet (5 mg total) by mouth once daily [provider] Taking Active   ?pregabalin (LYRICA) 25 MG capsule [id number] Yes Take by mouth. [provider] Taking Active   ?rosuvastatin (CRESTOR) 10 MG tablet [id number] Yes Take 1 tablet (10 mg total) by mouth daily. Jearld Fenton, NP Taking Active   ?traMADol (ULTRAM) 50 MG tablet [id number] Yes Take 1 tablet by mouth 2 (two) times daily. [provider] Taking Consider Medication Status and Discontinue (Completed Course) Self  ?TRAVATAN Z 0.004 % SOLN ophthalmic solution [id number] Yes Place 1 drop into both eyes at bedtime. Carrie Mew, MD Taking Active Multiple Informants, Self  ? ?  ?  ? ?  ? ? ?Pertinent Labs:  ?Lab Results  ?Component Value Date  ? HGBA1C 9.0 (H) 12/21/2021  ? ?Lab Results  ?Component Value Date  ? CHOL 104 02/10/2022  ? HDL 58 02/10/2022  ? [place] 26 02/10/2022  ? TRIG 112 02/10/2022  ? CHOLHDL 1.8 02/10/2022  ? ?Lab Results  ?Component Value Date  ? CREATININE 0.94 02/10/2022  ? BUN 19 02/10/2022  ? NA 141 02/10/2022  ? K 4.2 02/10/2022  ? CL 103 02/10/2022  ? CO2 24 02/10/2022  ? ?BP Readings from Last 3 Encounters:  ?02/10/22 136/84  ?01/06/22 127/71  ?12/23/21 (!) 154/91  ? ?Pulse Readings from Last 3 Encounters:  ?02/10/22 80  ?01/06/22 77  ?12/23/21 90  ? ? ? ?SDOH:  (Social Determinants of Health) assessments and interventions performed:  ? ? ?CCM  Care Plan ? ?Review of patient past medical history, allergies, medications, health status, including review of consultants reports, laboratory and other test data, was performed as part of comprehensive evaluation and provision of chronic care management services.  ? ?Care Plan : PharmD - Medication Management/T2DM  ?Updates made by Rennis Petty, RPH-CPP since 02/27/2022 12:00 AM  ?  ? ?Problem: Disease Progression   ?  ? ?Long-Range Goal: Disease Progression Prevented or Minimized   ?Start Date: 01/19/2022  ?Expected End Date: 04/19/2022  ?Priority: High  ?Note:   ?Current Barriers:  ?Unable to achieve control of blood sugar  ?Chronic Disease Management support and education needs related to managing HTN, GERD, major depressive disorder, Type II DM, history of CVA, diabetic neuropathy, GAD, OSA, HLD, constipation, obesity,  COPD ? ?Pharmacist Clinical Goal(s):  ?patient will achieve adherence to monitoring guidelines and medication adherence to achieve therapeutic efficacy ?achieve control of blood sugar as evidenced by A1C  through collaborat

## 2022-03-01 DIAGNOSIS — E1159 Type 2 diabetes mellitus with other circulatory complications: Secondary | ICD-10-CM | POA: Diagnosis not present

## 2022-03-01 DIAGNOSIS — Z7984 Long term (current) use of oral hypoglycemic drugs: Secondary | ICD-10-CM

## 2022-03-01 DIAGNOSIS — Z794 Long term (current) use of insulin: Secondary | ICD-10-CM | POA: Diagnosis not present

## 2022-03-01 DIAGNOSIS — F321 Major depressive disorder, single episode, moderate: Secondary | ICD-10-CM | POA: Diagnosis not present

## 2022-03-01 DIAGNOSIS — E785 Hyperlipidemia, unspecified: Secondary | ICD-10-CM

## 2022-03-01 DIAGNOSIS — F32A Depression, unspecified: Secondary | ICD-10-CM

## 2022-03-01 DIAGNOSIS — I10 Essential (primary) hypertension: Secondary | ICD-10-CM | POA: Diagnosis not present

## 2022-03-01 DIAGNOSIS — J449 Chronic obstructive pulmonary disease, unspecified: Secondary | ICD-10-CM

## 2022-03-01 DIAGNOSIS — F419 Anxiety disorder, unspecified: Secondary | ICD-10-CM | POA: Diagnosis not present

## 2022-03-02 ENCOUNTER — Other Ambulatory Visit: Payer: Self-pay

## 2022-03-02 ENCOUNTER — Emergency Department
Admission: EM | Admit: 2022-03-02 | Discharge: 2022-03-02 | Disposition: A | Discharge status: Home / Self Care | Payer: Medicare HMO | Attending: Emergency Medicine | Admitting: Emergency Medicine

## 2022-03-02 DIAGNOSIS — M5412 Radiculopathy, cervical region: Secondary | ICD-10-CM | POA: Insufficient documentation

## 2022-03-02 DIAGNOSIS — R45851 Suicidal ideations: Secondary | ICD-10-CM | POA: Insufficient documentation

## 2022-03-02 DIAGNOSIS — F329 Major depressive disorder, single episode, unspecified: Secondary | ICD-10-CM

## 2022-03-02 DIAGNOSIS — Z8673 Personal history of transient ischemic attack (TIA), and cerebral infarction without residual deficits: Secondary | ICD-10-CM | POA: Diagnosis not present

## 2022-03-02 DIAGNOSIS — M542 Cervicalgia: Secondary | ICD-10-CM | POA: Diagnosis not present

## 2022-03-02 DIAGNOSIS — F331 Major depressive disorder, recurrent, moderate: Secondary | ICD-10-CM | POA: Diagnosis not present

## 2022-03-02 DIAGNOSIS — R531 Weakness: Secondary | ICD-10-CM | POA: Diagnosis not present

## 2022-03-02 DIAGNOSIS — G8929 Other chronic pain: Secondary | ICD-10-CM | POA: Diagnosis not present

## 2022-03-02 DIAGNOSIS — R7309 Other abnormal glucose: Secondary | ICD-10-CM | POA: Diagnosis not present

## 2022-03-02 DIAGNOSIS — R2689 Other abnormalities of gait and mobility: Secondary | ICD-10-CM | POA: Diagnosis not present

## 2022-03-02 DIAGNOSIS — R252 Cramp and spasm: Secondary | ICD-10-CM | POA: Diagnosis not present

## 2022-03-02 DIAGNOSIS — R296 Repeated falls: Secondary | ICD-10-CM | POA: Diagnosis not present

## 2022-03-02 DIAGNOSIS — F32A Depression, unspecified: Secondary | ICD-10-CM | POA: Diagnosis not present

## 2022-03-02 LAB — URINE DRUG SCREEN, QUALITATIVE (ARMC ONLY)
Amphetamines, Ur Screen: NOT DETECTED
Barbiturates, Ur Screen: NOT DETECTED
Benzodiazepine, Ur Scrn: POSITIVE — AB
Cannabinoid 50 Ng, Ur ~~LOC~~: NOT DETECTED
Cocaine Metabolite,Ur ~~LOC~~: NOT DETECTED
MDMA (Ecstasy)Ur Screen: NOT DETECTED
Methadone Scn, Ur: NOT DETECTED
Opiate, Ur Screen: NOT DETECTED
Phencyclidine (PCP) Ur S: NOT DETECTED
Tricyclic, Ur Screen: NOT DETECTED

## 2022-03-02 LAB — CBC
HCT: 41.5 % (ref 36.0–46.0)
Hemoglobin: 12.4 g/dL (ref 12.0–15.0)
MCH: 25.4 pg — ABNORMAL LOW (ref 26.0–34.0)
MCHC: 29.9 g/dL — ABNORMAL LOW (ref 30.0–36.0)
MCV: 85 fL (ref 80.0–100.0)
Platelets: 216 10*3/uL (ref 150–400)
RBC: 4.88 MIL/uL (ref 3.87–5.11)
RDW: 15.9 % — ABNORMAL HIGH (ref 11.5–15.5)
WBC: 10.8 10*3/uL — ABNORMAL HIGH (ref 4.0–10.5)
nRBC: 0 % (ref 0.0–0.2)

## 2022-03-02 LAB — COMPREHENSIVE METABOLIC PANEL
ALT: 22 U/L (ref 0–44)
AST: 20 U/L (ref 15–41)
Albumin: 3.6 g/dL (ref 3.5–5.0)
Alkaline Phosphatase: 74 U/L (ref 38–126)
Anion gap: 10 (ref 5–15)
BUN: 23 mg/dL (ref 8–23)
CO2: 28 mmol/L (ref 22–32)
Calcium: 9.4 mg/dL (ref 8.9–10.3)
Chloride: 101 mmol/L (ref 98–111)
Creatinine, Ser: 1 mg/dL (ref 0.44–1.00)
GFR, Estimated: >60 mL/min (ref >60)
Glucose, Bld: 170 mg/dL — ABNORMAL HIGH (ref 70–99)
Potassium: 3.9 mmol/L (ref 3.5–5.1)
Sodium: 139 mmol/L (ref 135–145)
Total Bilirubin: 0.5 mg/dL (ref 0.3–1.2)
Total Protein: 7.2 g/dL (ref 6.5–8.1)

## 2022-03-02 LAB — ETHANOL: Alcohol, Ethyl (B): <10 mg/dL (ref <10)

## 2022-03-02 LAB — SALICYLATE LEVEL: Salicylate Lvl: <7 mg/dL — ABNORMAL LOW (ref 7.0–30.0)

## 2022-03-02 LAB — CBG MONITORING, ED: Glucose-Capillary: 143 mg/dL — ABNORMAL HIGH (ref 70–99)

## 2022-03-02 LAB — ACETAMINOPHEN LEVEL: Acetaminophen (Tylenol), Serum: <10 ug/mL — ABNORMAL LOW (ref 10–30)

## 2022-03-02 MED ORDER — GABAPENTIN 600 MG PO TABS
600.0000 mg | ORAL_TABLET | Freq: Three times a day (TID) | ORAL | Status: DC
  Administered 2022-03-02: 600 mg via ORAL
  Filled 2022-03-02: qty 1

## 2022-03-02 MED ORDER — KETOROLAC TROMETHAMINE 60 MG/2ML IM SOLN
15.0000 mg | Freq: Once | INTRAMUSCULAR | Status: AC
Start: 2022-03-02 — End: 2022-03-02
  Administered 2022-03-02: 15 mg via INTRAMUSCULAR
  Filled 2022-03-02: qty 2

## 2022-03-02 NOTE — ED Notes (Signed)
PATIENT PROPERLY DISPOSED OF TRASH ?

## 2022-03-02 NOTE — Discharge Instructions (Addendum)

## 2022-03-02 NOTE — ED Provider Notes (Signed)
? ?[facility] ?Provider Note ? ? ? Event Date/Time  ? First MD Initiated Contact with Patient 03/02/22 1459   ?  (approximate) ? ? ?History  ? ?Pain Management and Suicidal ? ? ?HPI ? ?Rachel Nielsen is a 71 y.o. female with a history of chronic neck pain, cervicalgia, reviewed outpatient office note from neurology dated today by Dr. Melrose Nakayama, also history of depression.  Also reviewed primary care note from [facility] ? ?Patient saw neurology today.  She will be taking gabapentin and Lyrica as well.  She reports that she has "stupidly" made a comment that at times her neck pain is so severe she feels like she could just take all her medications and kill herself.  However, she tells me she is not suicidal.  She is also made similar statements at primary care visits in the past ? ?She denies active suicidal ideation.  She does report however that for 2 years she has suffered with a severe right-sided neck pain due to arthritis and receives injections by Dr. Alba Destine.  She is due for another one and her pain has been flaring up for the last couple of weeks.  It is sharp and shoots pain from the right side of her neck down to her right hand and has been present for 2 years ? ? ? ? ?Physical Exam  ? ?Triage Vital Signs: ?ED Triage Vitals  ?Enc Vitals Group  ?   BP 03/02/22 1417 (!) 148/96  ?   Pulse Rate 03/02/22 1417 88  ?   Resp 03/02/22 1417 18  ?   Temp 03/02/22 1417 98.5 ?F (36.9 ?C)  ?   Temp Source 03/02/22 1417 Oral  ?   SpO2 03/02/22 1417 99 %  ?   Weight 03/02/22 1418 220 lb (99.8 kg)  ?   Height 03/02/22 1418 '5\' 4"'$  (1.626 m)  ?   Head Circumference --   ?   Peak Flow --   ?   Pain Score 03/02/22 1418 0  ?   Pain Loc --   ?   Pain Edu? --   ?   Excl. in [place]? --   ? ? ?Most recent vital signs: ?Vitals:  ? 03/02/22 1417  ?BP: (!) 148/96  ?Pulse: 88  ?Resp: 18  ?Temp: 98.5 ?F (36.9 ?C)  ?SpO2: 99%  ? ? ? ?General: Awake, no distress.  Resting pleasantly.  Does not appear to be in any acute  distress or discomfort.  Reports pain on the right side of her neck its been present for years ?CV:  Good peripheral perfusion.  Moves all extremities well without difficulty. ?Resp:  Normal effort.  Speaks in clear full sentences ?Abd:  No distention.  ?Other:  Denies suicidal ideation.  Reports she made a statement because of the amount of pain that she experiences, but denies that she is actually suicidal or would hurt herself ? ? ?ED Results / Procedures / Treatments  ? ?Labs ?(all labs ordered are listed, but only abnormal results are displayed) ?Labs Reviewed  ?COMPREHENSIVE METABOLIC PANEL - Abnormal; Notable for the following components:  ?    Result Value  ? Glucose, Bld 170 (*)   ? All other components within normal limits  ?SALICYLATE LEVEL - Abnormal; Notable for the following components:  ? Salicylate Lvl <1.8 (*)   ? All other components within normal limits  ?ACETAMINOPHEN LEVEL - Abnormal; Notable for the following components:  ? Acetaminophen (Tylenol), Serum <10 (*)   ?  All other components within normal limits  ?CBC - Abnormal; Notable for the following components:  ? WBC 10.8 (*)   ? MCH 25.4 (*)   ? MCHC 29.9 (*)   ? RDW 15.9 (*)   ? All other components within normal limits  ?URINE DRUG SCREEN, QUALITATIVE ([facility] ONLY) - Abnormal; Notable for the following components:  ? Benzodiazepine, Ur Scrn POSITIVE (*)   ? All other components within normal limits  ?CBG MONITORING, ED - Abnormal; Notable for the following components:  ? Glucose-Capillary 143 (*)   ? All other components within normal limits  ?ETHANOL  ?CBG MONITORING, ED  ? ? ? ?EKG ? ? ? ? ?RADIOLOGY ? ? ? ? ?PROCEDURES: ? ?Critical Care performed: No ? ?Procedures ? ? ?MEDICATIONS ORDERED IN ED: ?Medications  ?gabapentin (NEURONTIN) tablet 600 mg (600 mg Oral Given 03/02/22 1556)  ?ketorolac (TORADOL) injection 15 mg (15 mg Intramuscular Given 03/02/22 1556)  ? ? ? ?IMPRESSION / MDM / ASSESSMENT AND PLAN / ED COURSE  ?I reviewed the triage  vital signs and the nursing notes. ?             ?               ? ?Differential diagnosis includes, but is not limited to, making a suicidal statement.  Denies that she had true suicidal intent and denies being suicidal.  However review of some of her historical notes indicate recently that she has made similar statements with primary care as well as neurology clinic today.  She was voluntary and had an interview and received recommendations from our psychiatry team to continue outpatient treatment with Dr. Kasandra Knudsen. ? ?She is in no acute distress.  I do not believe that she is at elevated risk for acute suicidal attempt or injury.  She is quite pleasant and reports that these remarks were made due to chronicity of pain and not truly due to suicidal ideation ? ?Regarding her neck pain it is quite chronic.  We did give her a dose of Toradol here and she reports that actually brought her pain down to about a 3 which is much better.  She is following up with Dr. Alba Destine, and has an appointment on the ninth. ? ?----------------------------------------- ?4:43 PM on 03/02/2022 ?----------------------------------------- ? ? ?Return precautions and treatment recommendations and follow-up discussed with the patient who is agreeable with the plan. ? ? ? ? ?  ? ? ?FINAL CLINICAL IMPRESSION(S) / ED DIAGNOSES  ? ?Final diagnoses:  ?Major depressive disorder with current active episode, unspecified depression episode severity, unspecified whether recurrent  ?Chronic neck pain  ? ? ? ?Rx / DC Orders  ? ?ED Discharge Orders   ? ? None  ? ?  ? ? ? ?Note:  This document was prepared using Dragon voice recognition software and may include unintentional dictation errors. ?  Delman Kitten, MD ?03/02/22 1643 ? ?

## 2022-03-02 NOTE — ED Notes (Signed)
PATIENT RECEIVED DINNER TRAY, CALM AND COOPERATIVE ?

## 2022-03-02 NOTE — ED Triage Notes (Addendum)
Pt to ED via POV from [facility] after routine check up. Pt reports three pinched nerves in her neck and almost time for her to have a pain shot on the 9th but states the pain has gotten worse.  ? ?Pt states she made a comment to the nurse that when it rains her arthritis acts up and she has the thought of taking her pills and going to sleep. ? ?Pt with hx SI but states no intention to act on thoughts. Pt denies alcohol or other substances.  ? ?Pt belongings as follows: ?1 yellow shirt ?1 face mask ?1 pair blue jeans (remains on pt due to no scrub bottoms) ?1 white bra ?1 pair crocs ?1 pair black socks  ?1 black cane ?1 purse ? ? ?  ?
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
## 2022-03-10 DIAGNOSIS — E1165 Type 2 diabetes mellitus with hyperglycemia: Secondary | ICD-10-CM | POA: Diagnosis not present

## 2022-03-10 DIAGNOSIS — M9931 Osseous stenosis of neural canal of cervical region: Secondary | ICD-10-CM | POA: Diagnosis not present

## 2022-03-11 IMAGING — CT CT HEAD W/O CM
4 series · 16 of 47 positions shown, 18 images · non-contrast
Comparison: CT brain 11/17/2018

CLINICAL DATA: Pain in neck

EXAM:
CT HEAD WITHOUT CONTRAST
CT CERVICAL SPINE WITHOUT CONTRAST
TECHNIQUE: Multidetector CT imaging of the head and cervical spine was
performed following the standard protocol without intravenous
contrast. Multiplanar CT image reconstructions of the cervical spine
were also generated.

[Series 2: head wo · axial · 0.43mm/px · z∈[-131,-11]mm · 7 of 34 slices shown, 9 images]
[im 5/34  brain]
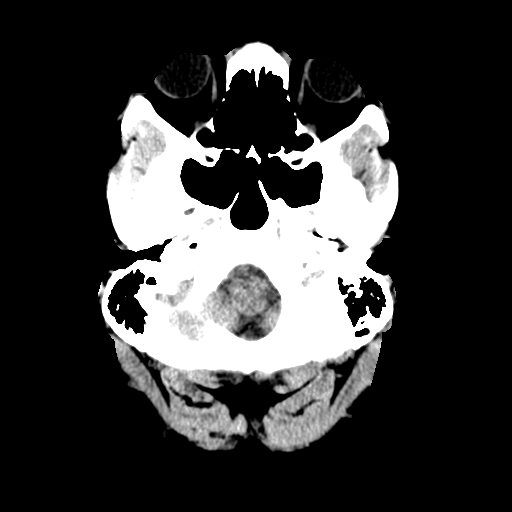
[im 5/34  bone]
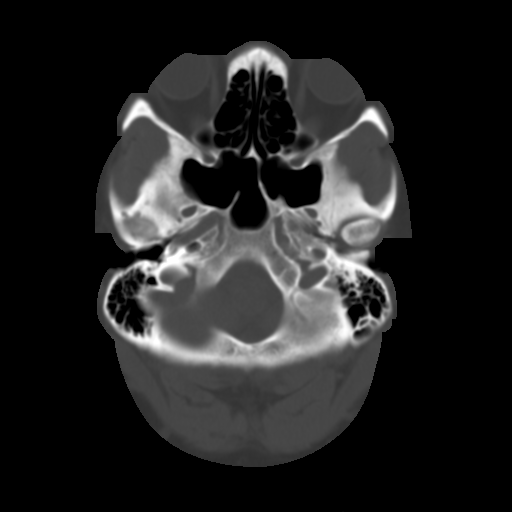
[im 9/34  brain]
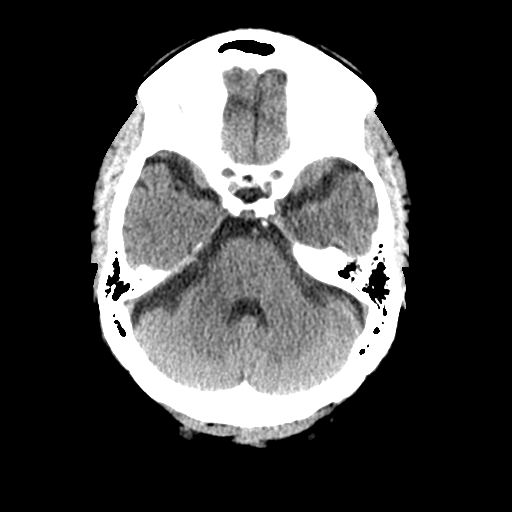
[im 13/34  brain]
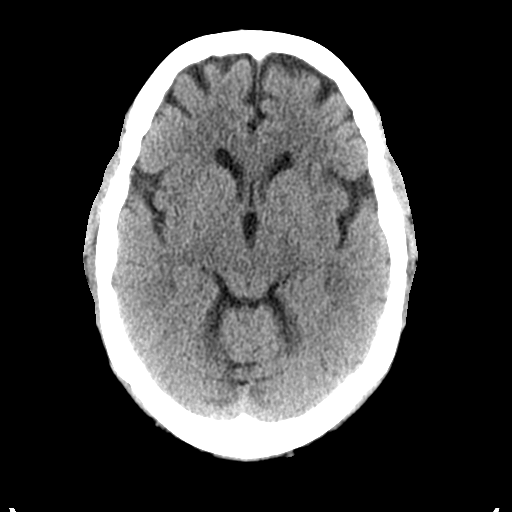
[im 17/34  brain]
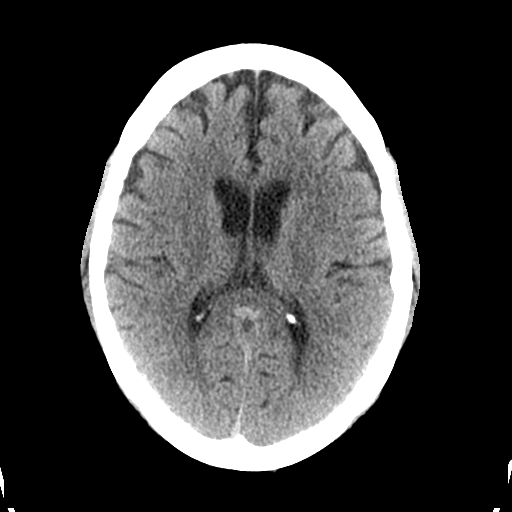
[im 21/34  brain]
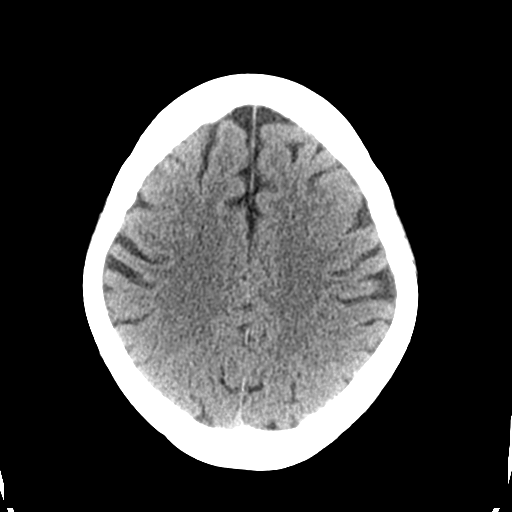
[im 21/34  bone]
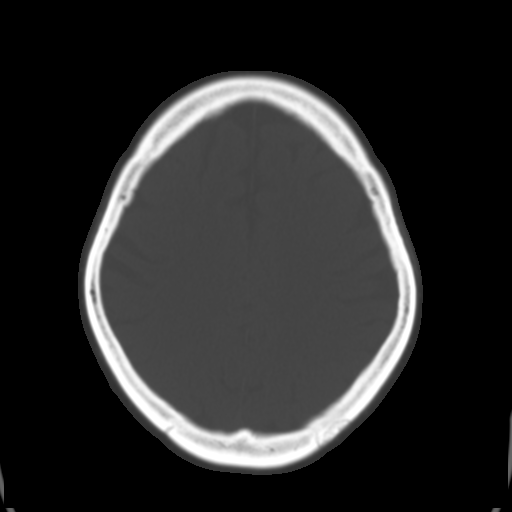
[im 25/34  brain]
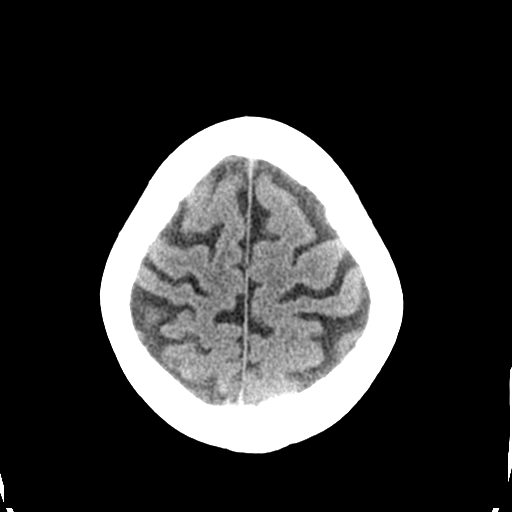
[im 29/34  brain]
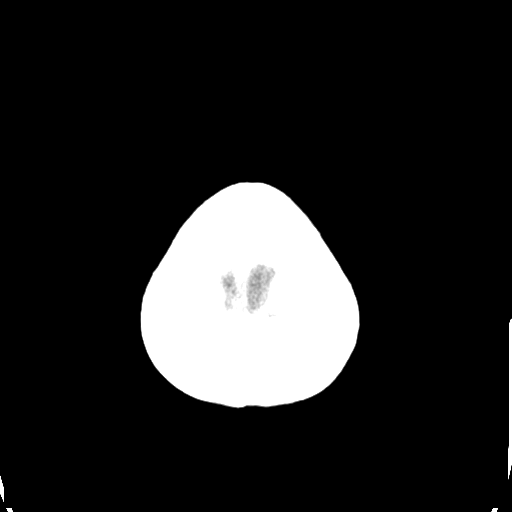

[Series 3: head bone · axial · 0.43mm/px · z∈[-135,-101]mm · 3 of 85 slices shown]
[im 9/85  bone]
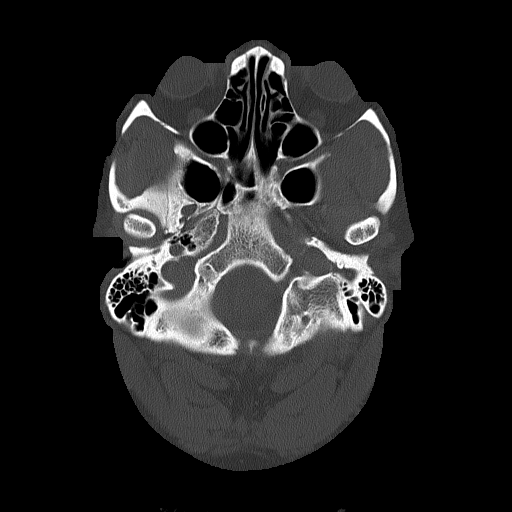
[im 17/85  bone]
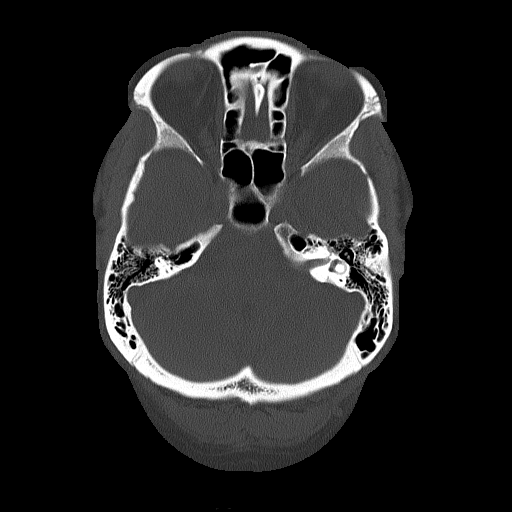
[im 26/85  bone]
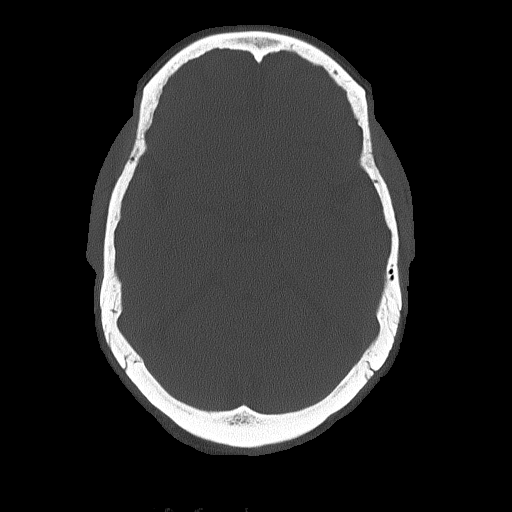

[Series 4: coronal soft tissue · coronal · 0.33mm/px · 3 of 70 slices shown]
[im 24/70  brain]
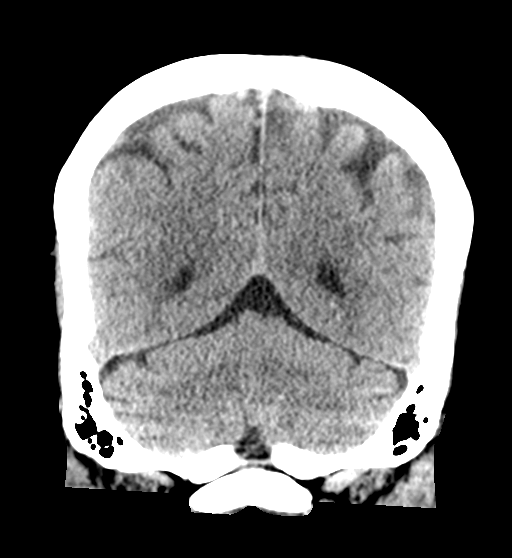
[im 31/70  brain]
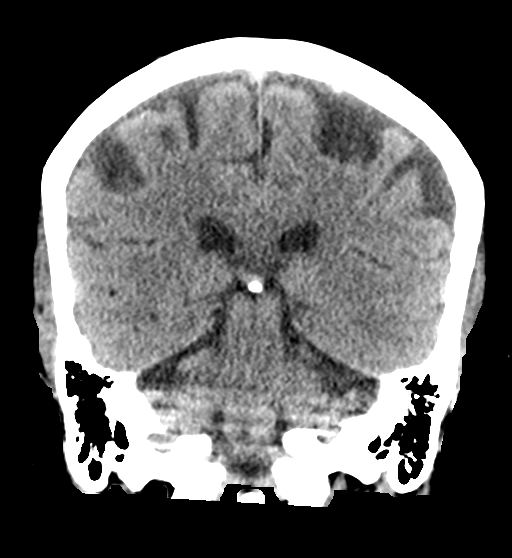
[im 39/70  brain]
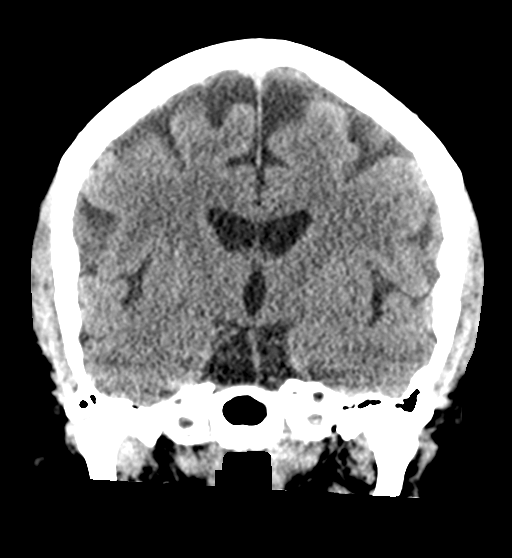

[Series 5: sagittal soft tissue · sagittal · 0.36mm/px · 3 of 60 slices shown]
[im 20/60  brain]
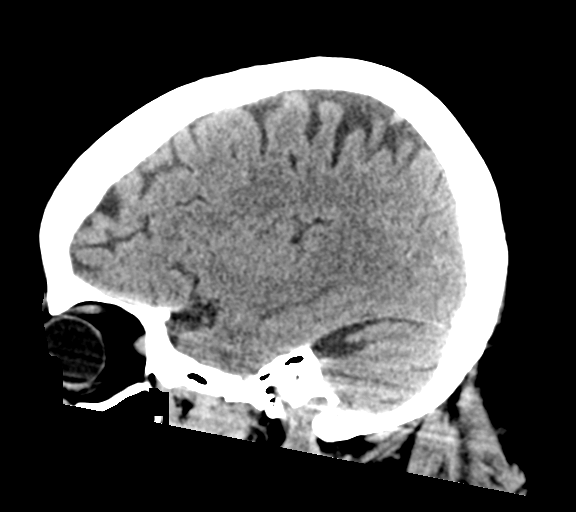
[im 30/60  brain]
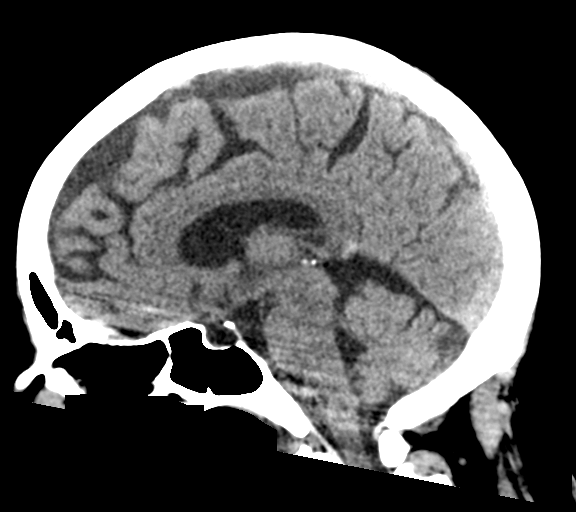
[im 40/60  brain]
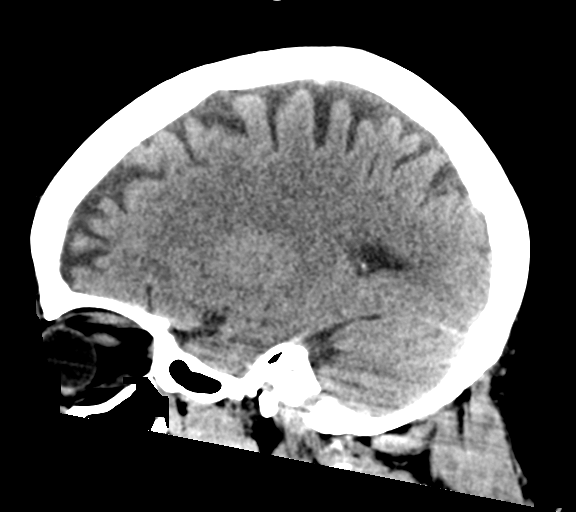

[16 of 47 positions shown; findings below may reference images not displayed]

FINDINGS: CT HEAD FINDINGS

Brain: No acute territorial infarction, hemorrhage or intracranial
mass. Mild atrophy. Minimal hypodensity in the white matter likely
chronic small vessel ischemic change. Stable ventricle size

Vascular: No hyperdense vessels.  No unexpected calcification

Skull: Normal. Negative for fracture or focal lesion.

Sinuses/Orbits: Old deformity medial wall right orbit. Paranasal
sinuses are clear

Other: None

CT CERVICAL SPINE FINDINGS

Alignment: Reversal of cervical lordosis. No subluxation. Facet
alignment within normal

Skull base and vertebrae: No acute fracture. No primary bone lesion
or focal pathologic process.

Soft tissues and spinal canal: No prevertebral fluid or swelling. No
visible canal hematoma.

Disc levels: Multiple level degenerative changes, most advanced at
C5-C6 and C6-C7. Hypertrophic facet degenerative changes at multiple
levels with foraminal narrowing.

Upper chest: Negative.

Other: None
IMPRESSION: 1. No CT evidence for acute intracranial abnormality. Atrophy and
mild small vessel ischemic changes of the white matter.
2. Reversal of cervical lordosis with degenerative changes. No acute
osseous abnormality.

## 2022-03-16 ENCOUNTER — Other Ambulatory Visit: Payer: Self-pay | Admitting: Internal Medicine

## 2022-03-17 DIAGNOSIS — R531 Weakness: Secondary | ICD-10-CM | POA: Diagnosis not present

## 2022-03-17 DIAGNOSIS — R2689 Other abnormalities of gait and mobility: Secondary | ICD-10-CM | POA: Diagnosis not present

## 2022-03-17 DIAGNOSIS — R296 Repeated falls: Secondary | ICD-10-CM | POA: Diagnosis not present

## 2022-03-17 DIAGNOSIS — Z8673 Personal history of transient ischemic attack (TIA), and cerebral infarction without residual deficits: Secondary | ICD-10-CM | POA: Diagnosis not present

## 2022-03-17 DIAGNOSIS — M542 Cervicalgia: Secondary | ICD-10-CM | POA: Diagnosis not present

## 2022-03-17 NOTE — Telephone Encounter (Signed)
Requested Prescriptions  ?Pending Prescriptions Disp Refills  ?? fluticasone (FLONASE) 50 MCG/ACT nasal spray [Pharmacy Med Name: FLUTICASONE PROP 50 MCG SPRAY] 16 g 0  ?  Sig: Place 1 spray into both nostrils daily.  ?  ? Ear, Nose, and Throat: Nasal Preparations - Corticosteroids Passed - 03/16/2022  1:51 PM  ?  ?  Passed - Valid encounter within last 12 months  ?  Recent Outpatient Visits   ?      ? 1 month ago Encounter for general adult medical examination with abnormal findings  ? [facility] [place], [place] W, NP  ? 2 months ago COPD exacerbation [facility])  ? [facility] [place], [place] W, NP  ? 8 months ago Mixed diabetic hyperlipidemia associated with type 2 diabetes mellitus ([place])  ? [facility] [place], [place] W, NP  ? 1 year ago Uncontrolled type 2 diabetes mellitus with hyperglycemia ([place])  ? [place], DO  ? 1 year ago Encounter to establish care with new doctor  ? [facility], Lupita Raider, FNP  ?  ?  ?Future Appointments   ?        ? In 1 month Baity, Coralie Keens, NP [facility] [place]  ? In 10 months MacDiarmid, Nicki Reaper, MD [place]  ?  ? ?  ?  ?  ? ? ? ?

## 2022-03-22 DIAGNOSIS — F419 Anxiety disorder, unspecified: Secondary | ICD-10-CM | POA: Diagnosis not present

## 2022-03-22 DIAGNOSIS — F321 Major depressive disorder, single episode, moderate: Secondary | ICD-10-CM | POA: Diagnosis not present

## 2022-03-24 ENCOUNTER — Other Ambulatory Visit: Payer: Self-pay | Admitting: Internal Medicine

## 2022-03-24 DIAGNOSIS — M542 Cervicalgia: Secondary | ICD-10-CM | POA: Diagnosis not present

## 2022-03-24 DIAGNOSIS — M5441 Lumbago with sciatica, right side: Secondary | ICD-10-CM | POA: Diagnosis not present

## 2022-03-24 DIAGNOSIS — G8929 Other chronic pain: Secondary | ICD-10-CM | POA: Diagnosis not present

## 2022-03-24 DIAGNOSIS — M5442 Lumbago with sciatica, left side: Secondary | ICD-10-CM | POA: Diagnosis not present

## 2022-03-24 DIAGNOSIS — E1165 Type 2 diabetes mellitus with hyperglycemia: Secondary | ICD-10-CM

## 2022-03-24 DIAGNOSIS — M5412 Radiculopathy, cervical region: Secondary | ICD-10-CM | POA: Diagnosis not present

## 2022-03-24 DIAGNOSIS — M9931 Osseous stenosis of neural canal of cervical region: Secondary | ICD-10-CM | POA: Diagnosis not present

## 2022-03-25 NOTE — Telephone Encounter (Signed)
Requested Prescriptions  Pending Prescriptions Disp Refills  . rosuvastatin (CRESTOR) 10 MG tablet [Pharmacy Med Name: ROSUVASTATIN CALCIUM 10 MG TAB] 90 tablet 0    Sig: Take 1 tablet (10 mg total) by mouth daily.     Cardiovascular:  Antilipid - Statins 2 Failed - 03/24/2022 11:40 AM      Failed - Lipid Panel in normal range within the last 12 months    Cholesterol  Date Value Ref Range Status  02/10/2022 104 <200 mg/dL Final   LDL Cholesterol (Calc)  Date Value Ref Range Status  02/10/2022 26 mg/dL (calc) Final    Comment:    Reference range: <100 . Desirable range <100 mg/dL for primary prevention;   <70 mg/dL for patients with CHD or diabetic patients  with > or = 2 CHD risk factors. Marland Kitchen LDL-C is now calculated using the Martin-Hopkins  calculation, which is a validated novel method providing  better accuracy than the Friedewald equation in the  estimation of LDL-C.  Cresenciano Genre et al. Annamaria Helling. 4696;295(28): 2061-2068  (http://education.[facility].com/faq/FAQ164)    HDL  Date Value Ref Range Status  02/10/2022 58 > OR = 50 mg/dL Final   Triglycerides  Date Value Ref Range Status  02/10/2022 112 <150 mg/dL Final         Passed - Cr in normal range and within 360 days    Creat  Date Value Ref Range Status  02/10/2022 0.94 0.50 - 1.05 mg/dL Final   Creatinine, Ser  Date Value Ref Range Status  03/02/2022 1.00 0.44 - 1.00 mg/dL Final   Creatinine, Urine  Date Value Ref Range Status  02/10/2022 43 20 - 275 mg/dL Final         Passed - Patient is not pregnant      Passed - Valid encounter within last 12 months    Recent Outpatient Visits          1 month ago Encounter for general adult medical examination with abnormal findings   [facility] [place], Coralie Keens, NP   2 months ago COPD exacerbation [facility])   [facility] [place], Coralie Keens, NP   8 months ago Mixed diabetic hyperlipidemia associated with type 2 diabetes mellitus [facility])    [facility] [place], Coralie Keens, NP   1 year ago Uncontrolled type 2 diabetes mellitus with hyperglycemia [facility])   [facility] O[place] Hauser, DO   1 year ago Encounter to establish care with new doctor   [place], FNP      Future Appointments            In 1 month Baity, Coralie Keens, NP [facility] [place]   In 10 months Bjorn Loser, [facility]

## 2022-03-27 ENCOUNTER — Other Ambulatory Visit: Payer: Self-pay | Admitting: Internal Medicine

## 2022-03-31 NOTE — Telephone Encounter (Signed)
Requested medication (s) are due for refill today - yes  Requested medication (s) are on the active medication list -yes  Future visit scheduled -yes  Last refill: 12/29/21  Notes to clinic: historical provider  Requested Prescriptions  Pending Prescriptions Disp Refills   omeprazole (PRILOSEC) 20 MG capsule [Pharmacy Med Name: OMEPRAZOLE DR 20 MG CAPSULE] 90 capsule 0    Sig: Take 1 capsule (20 mg total) by mouth daily.     Gastroenterology: Proton Pump Inhibitors Passed - 03/27/2022  2:30 PM      Passed - Valid encounter within last 12 months    Recent Outpatient Visits           1 month ago Encounter for general adult medical examination with abnormal findings   [facility] [place], Coralie Keens, NP   2 months ago COPD exacerbation [facility])   [facility], Coralie Keens, NP   8 months ago Mixed diabetic hyperlipidemia associated with type 2 diabetes mellitus [facility])   [facility], Coralie Keens, NP   1 year ago Uncontrolled type 2 diabetes mellitus with hyperglycemia [facility])   [place], DO   1 year ago Encounter to establish care with new doctor   [place], FNP       Future Appointments             In 1 month Baity, Coralie Keens, NP [facility], [place]   In 10 months MacDiarmid, Nicki Reaper, MD [facility]                Requested Prescriptions  Pending Prescriptions Disp Refills   omeprazole (PRILOSEC) 20 MG capsule [Pharmacy Med Name: OMEPRAZOLE DR 20 MG CAPSULE] 90 capsule 0    Sig: Take 1 capsule (20 mg total) by mouth daily.     Gastroenterology: Proton Pump Inhibitors Passed - 03/27/2022  2:30 PM      Passed - Valid encounter within last 12 months    Recent Outpatient Visits           1 month ago Encounter for general adult medical examination with abnormal findings   [facility] [place], Coralie Keens,  NP   2 months ago COPD exacerbation [facility])   [facility], Coralie Keens, NP   8 months ago Mixed diabetic hyperlipidemia associated with type 2 diabetes mellitus [facility])   [facility], NP   1 year ago Uncontrolled type 2 diabetes mellitus with hyperglycemia [facility])   [facility] Olin Hauser, DO   1 year ago Encounter to establish care with new doctor   [place], FNP       Future Appointments             In 1 month Baity, Coralie Keens, NP [facility] [place]   In 10 months Bjorn Loser, [facility]

## 2022-04-01 DIAGNOSIS — F3189 Other bipolar disorder: Secondary | ICD-10-CM | POA: Diagnosis not present

## 2022-04-02 DIAGNOSIS — Z7952 Long term (current) use of systemic steroids: Secondary | ICD-10-CM | POA: Diagnosis not present

## 2022-04-02 DIAGNOSIS — E1169 Type 2 diabetes mellitus with other specified complication: Secondary | ICD-10-CM | POA: Diagnosis not present

## 2022-04-02 DIAGNOSIS — Z794 Long term (current) use of insulin: Secondary | ICD-10-CM | POA: Diagnosis not present

## 2022-04-02 DIAGNOSIS — E1142 Type 2 diabetes mellitus with diabetic polyneuropathy: Secondary | ICD-10-CM | POA: Diagnosis not present

## 2022-04-02 DIAGNOSIS — E669 Obesity, unspecified: Secondary | ICD-10-CM | POA: Diagnosis not present

## 2022-04-02 DIAGNOSIS — E1165 Type 2 diabetes mellitus with hyperglycemia: Secondary | ICD-10-CM | POA: Diagnosis not present

## 2022-04-02 DIAGNOSIS — Z780 Asymptomatic menopausal state: Secondary | ICD-10-CM | POA: Diagnosis not present

## 2022-04-03 ENCOUNTER — Other Ambulatory Visit: Payer: Self-pay | Admitting: Internal Medicine

## 2022-04-03 NOTE — Telephone Encounter (Signed)
Requested Prescriptions  Pending Prescriptions Disp Refills  . omeprazole (PRILOSEC) 40 MG capsule [Pharmacy Med Name: OMEPRAZOLE DR 40 MG CAPSULE] 180 capsule 0    Sig: Take 1 capsule (40 mg total) by mouth in the morning and at bedtime.     Gastroenterology: Proton Pump Inhibitors Passed - 04/03/2022 11:57 AM      Passed - Valid encounter within last 12 months    Recent Outpatient Visits          1 month ago Encounter for general adult medical examination with abnormal findings   [facility] [place], Coralie Keens, NP   2 months ago COPD exacerbation [facility])   [facility], Coralie Keens, NP   8 months ago Mixed diabetic hyperlipidemia associated with type 2 diabetes mellitus [facility])   [facility], NP   1 year ago Uncontrolled type 2 diabetes mellitus with hyperglycemia [facility])   [facility] Olin Hauser, DO   1 year ago Encounter to establish care with new doctor   [place], FNP      Future Appointments            In 1 month Baity, Coralie Keens, NP [facility], [place]   In 9 months [place], [facility]

## 2022-04-05 DIAGNOSIS — F321 Major depressive disorder, single episode, moderate: Secondary | ICD-10-CM | POA: Diagnosis not present

## 2022-04-05 DIAGNOSIS — F419 Anxiety disorder, unspecified: Secondary | ICD-10-CM | POA: Diagnosis not present

## 2022-04-08 ENCOUNTER — Ambulatory Visit (INDEPENDENT_AMBULATORY_CARE_PROVIDER_SITE_OTHER): Payer: Medicare HMO | Admitting: Pharmacist

## 2022-04-08 DIAGNOSIS — J432 Centrilobular emphysema: Secondary | ICD-10-CM

## 2022-04-08 DIAGNOSIS — I10 Essential (primary) hypertension: Secondary | ICD-10-CM

## 2022-04-08 DIAGNOSIS — E1165 Type 2 diabetes mellitus with hyperglycemia: Secondary | ICD-10-CM

## 2022-04-08 NOTE — Patient Instructions (Signed)
Visit Information  Thank you for taking time to visit with me today. Please don't hesitate to contact me if I can be of assistance to you before our next scheduled telephone appointment.  Following are the goals we discussed today:   Goals Addressed             This Visit's Progress    Pharmacy Goals       Our goal A1c is less than 7%. This corresponds with fasting sugars less than 130 and 2 hour after meal sugars less than 180. Please check your blood sugar and keep a log of the results  Our goal bad cholesterol, or LDL, is less than 70 . This is why it is important to continue taking your rosuvastatin.  Feel free to call me with any questions or concerns. I look forward to our next call!     Wallace Cullens, PharmD, [place] [phone]         Our next appointment is by telephone on 06/08/2022 at 11:15 am  Please call the care guide team at [phone] if you need to cancel or reschedule your appointment.    Patient verbalizes understanding of instructions and care plan provided today and agrees to view in [place]. Active MyChart status and patient understanding of how to access instructions and care plan via MyChart confirmed with patient.

## 2022-04-08 NOTE — Chronic Care Management (AMB) (Signed)
Chronic Care Management CCM Pharmacy Note  04/08/2022 Name:  Rachel Nielsen MRN:  [id number] DOB:  [birth year]   Subjective: Rachel Nielsen is an 71 y.o. year old female who is a primary patient of Jearld Fenton, NP.  The CCM team was consulted for assistance with disease management and care coordination needs.    Engaged with patient by telephone for follow up visit for pharmacy case management and/or care coordination services.   Objective:  Medications Reviewed Today     Reviewed by Rennis Petty, RPH-CPP (Pharmacist) on 04/08/22 at 1341  Med List Status: <None>   Medication Order Taking? Sig Documenting Provider Last Dose Status Informant  ACCU-CHEK GUIDE test strip [id number]  Use to check blood sugar up to twice a day as directed Jearld Fenton, NP  Active Self  Accu-Chek Softclix Lancets lancets [id number]  Use to check blood sugar up to twice a day as directed Jearld Fenton, NP  Active Self  albuterol (PROVENTIL) (2.5 MG/3ML) 0.083% nebulizer solution [id number] Yes Inhale 3 mLs (2.5 mg total) into the lungs every 4 (four) hours as needed for wheezing or shortness of breath. Lorella Nimrod, MD Taking Active   albuterol (VENTOLIN HFA) 108 (90 Base) MCG/ACT inhaler [id number] Yes Inhale 2-4 puffs by mouth every 4 hours as needed for wheezing, cough, and/or shortness of breath Malfi, Lupita Raider, FNP Taking Active Multiple Informants, Self  ALPRAZolam (XANAX) 1 MG tablet [id number] Yes Take 1 mg by mouth 3 (three) times daily as needed. [provider] Taking Active Multiple Informants, Self  amLODipine (NORVASC) 5 MG tablet [id number] Yes Take 1 tablet (5 mg total) by mouth daily. Jearld Fenton, NP Taking Active   asenapine (SAPHRIS) 5 MG SUBL 24 hr tablet [id number] Yes Place 5 mg under the tongue every evening. [provider] Taking Active Multiple Informants, Self  aspirin 81 MG EC tablet [id number] Yes Take 1 tablet (81 mg total) by mouth daily. Swallow whole.  Jearld Fenton, NP Taking Active   bisoprolol (ZEBETA) 10 MG tablet [id number] Yes Take 1 tablet (10 mg total) by mouth daily. OFFICE VISIT NEEDED FOR ADDITIONAL REFILLS Jearld Fenton, NP Taking Active   buPROPion (WELLBUTRIN XL) 300 MG 24 hr tablet [id number] Yes Take 1 tablet (300 mg total) by mouth daily. Carrie Mew, MD Taking Active Multiple Informants, Self  busPIRone (BUSPAR) 15 MG tablet [id number] Yes Take 15 mg by mouth 2 (two) times daily.  [provider] Taking Active Multiple Informants, Self  docusate sodium (COLACE) 100 MG capsule [id number] Yes Take 100 mg by mouth daily. [provider] Taking Active   Dulaglutide 3 MG/0.5ML SOPN [id number] Yes Inject 0.5 mLs (3 mg total) subcutaneously once a week [provider] Taking Active   FARXIGA 10 MG TABS tablet [id number] Yes Take 10 mg by mouth daily. [provider] Taking Active Multiple Informants, Self  fluticasone (FLONASE) 50 MCG/ACT nasal spray [id number] Yes Place 1 spray into both nostrils daily. Jearld Fenton, NP Taking Active   fluticasone-salmeterol (ADVAIR) 250-50 MCG/ACT AEPB [id number] Yes Inhale 1 puff into the lungs 2 (two) times daily. [provider] Taking Active Multiple Informants, Self  gabapentin (NEURONTIN) 600 MG tablet [id number] Yes Take 600-900 mg by mouth 3 (three) times daily. 600 mg AM, 600 mg pm and 900 mg at bedtime [provider] Taking Active Multiple Informants, Self  GLOBAL EASE INJECT PEN NEEDLES 32G X 4 MM MISC [id number]  Use as directed with  insulin Kendell Bane, NP  Active Multiple Informants, Self  insulin aspart (NOVOLOG) 100 UNIT/ML FlexPen [id number] Yes Inject 20 units before the meals, three times daily. Do not take after meals. [provider] Taking Active   Insulin Glargine Solostar (LANTUS) 100 UNIT/ML Solostar Pen [id number] Yes Inject 66 Units subcutaneously at bedtime [provider] Taking Active   Insulin Pen  Needle (BD PEN NEEDLE NANO U/F) 32G X 4 MM MISC [id number]  Inject 1 Device into the skin daily. Jearld Fenton, NP  Active Multiple Informants, Self  Insulin Syringe-Needle U-100 (INSULIN SYRINGE 1CC/31GX5/16") 31G X 5/16" 1 ML MISC [id number]  1 Syringe by Does not apply route in the morning, at noon, in the evening, and at bedtime. Jearld Fenton, NP  Active Multiple Informants, Self  LINZESS 290 MCG CAPS capsule [id number] Yes Take 1 capsule (290 mcg total) by mouth daily before breakfast. Jearld Fenton, NP Taking Active   lurasidone (LATUDA) 20 MG TABS tablet [id number] Yes Take 1 tablet by mouth daily. [provider] Taking Active   metFORMIN (GLUCOPHAGE) 1000 MG tablet [id number] Yes Take 1,000 mg by mouth 2 (two) times daily with a meal. [provider] Taking Active   mirabegron ER (MYRBETRIQ) 50 MG TB24 tablet [id number] Yes Take 1 tablet (50 mg total) by mouth daily. Bjorn Loser, MD Taking Active   montelukast (SINGULAIR) 10 MG tablet [id number] Yes Take 1 tablet (10 mg total) by mouth daily. Carrie Mew, MD Taking Active Multiple Informants, Self  Olopatadine HCl 0.2 % SOLN [id number] Yes Place 1 drop into both eyes daily. Malfi, Lupita Raider, FNP Taking Active Multiple Informants, Self  omeprazole (PRILOSEC) 40 MG capsule [id number] Yes Take 1 capsule (40 mg total) by mouth in the morning and at bedtime. Jearld Fenton, NP Taking Active   predniSONE (DELTASONE) 5 MG tablet [id number] Yes Take 1 tablet (5 mg total) by mouth once daily [provider] Taking Active   pregabalin (LYRICA) 50 MG capsule [id number] Yes Take 50 mg in the morning and 100 mg at night for a week then increase to 100 mg twice a day [provider] Taking Active   rosuvastatin (CRESTOR) 10 MG tablet [id number] Yes Take 1 tablet (10 mg total) by mouth daily. Jearld Fenton, NP Taking Active   TRAVATAN Z 0.004 % SOLN ophthalmic solution [id number] Yes Place 1 drop into both eyes at  bedtime. Carrie Mew, MD Taking Active Multiple Informants, Self            Pertinent Labs: Lab Results  Component Value Date   HGBA1C 9.0 (H) 12/21/2021  Per shared record from [place] A1C 9.3% on 04/02/2022  Lab Results  Component Value Date   CHOL 104 02/10/2022   HDL 58 02/10/2022   LDLCALC 26 02/10/2022   TRIG 112 02/10/2022   CHOLHDL 1.8 02/10/2022   Lab Results  Component Value Date   CREATININE 1.00 03/02/2022   BUN 23 03/02/2022   NA 139 03/02/2022   K 3.9 03/02/2022   CL 101 03/02/2022   CO2 28 03/02/2022    SDOH:  (Social Determinants of Health) assessments and interventions performed:    [place]  Review of patient past medical history, allergies, medications, health status, including review of consultants reports, laboratory and other test data, was performed as part of comprehensive evaluation and provision of chronic care management services.   Care Plan : PharmD - Medication Management/T2DM  Updates made by Jossie Smoot,  Virl Diamond, RPH-CPP since 04/08/2022 12:00 AM     Problem: Disease Progression      Long-Range Goal: Disease Progression Prevented or Minimized   Start Date: 01/19/2022  Expected End Date: 04/19/2022  Priority: High  Note:   Current Barriers:  Unable to achieve control of blood sugar  Chronic Disease Management support and education needs related to managing HTN, GERD, major depressive disorder, Type II DM, history of CVA, diabetic neuropathy, GAD, OSA, HLD, constipation, obesity, COPD  Pharmacist Clinical Goal(s):  patient will achieve adherence to monitoring guidelines and medication adherence to achieve therapeutic efficacy achieve control of blood sugar as evidenced by A1C  through collaboration with PharmD and provider.    Interventions: 1:1 collaboration with Jearld Fenton, NP regarding development and update of comprehensive plan of care as evidenced by provider attestation and  co-signature Inter-disciplinary care team collaboration (see longitudinal plan of care) Perform chart review Patient seen for Office Visit with [facility] End[facility]inology on 04/02/2022. Per review of provider note in [place] shared record: A1C 9.3% Provider advised patient to adjust insulins to: LANTUS 66 units at bedtime NOVOLOG 20 units before the meals Continue Trulicity, Farxiga, and metformin as prescribed Instructed patient to monitor blood sugars 3 times daily and bring meter to each follow up visit Ordered DEXA to screen for low bone density Office Visit with [facility] on 5/23 Referral placed for home PT Office Visit with [facility] Neurology on 5/16. Provider advised patient: Increase Lyrica to 50 mg in morning, 100 mg at night for one week. Then increase to 100 mg in morning and 100 mg at night. Continue Gabapentin 600 mg in the morning, 600 mg in the afternoon, and 900 mg at night Patient uses weekly pillbox and daily medication alarm to aid with medication adherence Comprehensive medication review performed; medication list updated in electronic medical record Reports Dr. Kasandra Knudsen recently decreased her Latuda dose to 20 mg daily Reports has increased Lyrica and continuing to take gabapentin as directed by Neurologist Caution patient for increased risk of dizziness/sedation with this combination. Patient verbalizes understanding, but states that she prefers to try this combination and will then plan to follow up with Neurologist regarding risk vs. Benefits at upcoming appointment on 6/19 Confirms using walker and takes positional changes slowly Reports waiting on working with physical therapy until pain management improved  T2DM: Uncontrolled; current treatment: Metformin 1000 mg twice daily Trulicity 3 mg weekly on Fridays Lantus 60 units nightly Novolog 20 units three times daily before meals Farxiga 10 mg daily Note  patient currently taking prednisone 5 mg daily as prescribed by Pulmonology Patient reports she increased Lantus dose to 60 units nightly last week, as this was the instruction that she recalled receiving from Endocrinologist at office visit on 04/02/2022 Per provider note/after visit summary, provider recommended patient adjust Lantus to 66 units at bedtime Place call to [facility]ology today to confirm latest dosing instruction for Lantus from [place] message with Kellie Shropshire requesting confirmation Advise patient that if she has not heard back from Endocrinology office, to place a call for this confirmation Reports monitoring home blood sugar, but denies keeping record Recalls reading this morning before breakfast: 166 Recalls recent morning fasting readings ranging: 166-200 Reports brings meter into appointments with Endocrinologist for download Discuss importance of having regular well-balanced meals, while controlling carbohydrate portion sizes Reports no longer skipping meals, now having 3 meals/day Encourage patient to review nutrition labels for total  carbohydrate content and serving sizes Exercise: limited by neuropathy cramping in her feet (will discuss again with Dr. Melrose Nakayama at appointment next week) Statin: rosuvastatin 10 mg daily Counsel patient on s/s of low blood sugar and how to treat lows Review rules of 15s - importance of using 15 grams of sugar to treat low, recheck blood sugar in 15 minutes, treat again if remains low or, if back to normal, having meal if mealtime or snack  Patient reports has glucose tablets. Encourage patient to carry these with her in case of s/s of lows Encourage patient to monitor home blood sugar three times daily as directed by Endocrinologist Discuss benefits of continuous glucose monitoring (CGM). Patient interested and states that she will reach out to her Endocrinologist to see if provider would have her start CGM States she  has a [employer] that she could use in place of the reader device  HTN: Current treatment: Amlodipine 5 mg daily Bisoprolol 10 mg daily Reports has a home upper arm blood pressure monitor from [employer], but not yet using Again encourage patient to start checking 1-2 times/week, keep log of results and bring record with her to upcoming appointment with Cardiologist Counsel patient on BP monitoring technique  COPD: Current treatment: Advair 250/50 mcg- 1 puff twice daily Albuterol HFA inhaler as needed Albuterol nebulizer solution as needed Montelukast 10 mg daily Prednisone 5 mg daily Confirms using Advair twice daily as directed and rinsing out mouth after each use. Confirms using albuterol inhaler or nebulizer solution as needed as directed Reports breathing improved since on prednisone by Pulmonologist  Patient Goals/Self-Care Activities patient will:  - take medications as prescribed as evidenced by patient report and record review - check glucose, document, and provide at future appointments - check blood pressure, document, and provide at future appointments       Plan: Telephone follow up appointment with care management team member scheduled for:  06/08/2022 11:15 AM  Wallace Cullens, PharmD, [place] [phone]

## 2022-04-10 ENCOUNTER — Other Ambulatory Visit: Payer: Self-pay | Admitting: Internal Medicine

## 2022-04-10 ENCOUNTER — Ambulatory Visit: Payer: Medicare HMO | Admitting: Pharmacist

## 2022-04-10 DIAGNOSIS — E1165 Type 2 diabetes mellitus with hyperglycemia: Secondary | ICD-10-CM

## 2022-04-10 NOTE — Chronic Care Management (AMB) (Signed)
Chronic Care Management CCM Pharmacy Note  04/10/2022 Name:  Rachel Nielsen MRN:  [id number] DOB:  29-Oct-1951   Subjective: Rachel Nielsen is an 71 y.o. year old female who is a primary patient of Jearld Fenton, NP.  The CCM team was consulted for assistance with disease management and care coordination needs.    Receive call back from [facility] today  Engaged with patient by telephone for follow up visit for pharmacy case management and/or care coordination services.   Objective:  Medications Reviewed Today     Reviewed by Rennis Petty, RPH-CPP (Pharmacist) on 04/08/22 at 1341  Med List Status: <None>   Medication Order Taking? Sig Documenting Provider Last Dose Status Informant  ACCU-CHEK GUIDE test strip [id number]  Use to check blood sugar up to twice a day as directed Jearld Fenton, NP  Active Self  Accu-Chek Softclix Lancets lancets [id number]  Use to check blood sugar up to twice a day as directed Jearld Fenton, NP  Active Self  albuterol (PROVENTIL) (2.5 MG/3ML) 0.083% nebulizer solution [id number] Yes Inhale 3 mLs (2.5 mg total) into the lungs every 4 (four) hours as needed for wheezing or shortness of breath. Lorella Nimrod, MD Taking Active   albuterol (VENTOLIN HFA) 108 (90 Base) MCG/ACT inhaler [id number] Yes Inhale 2-4 puffs by mouth every 4 hours as needed for wheezing, cough, and/or shortness of breath Malfi, Lupita Raider, FNP Taking Active Multiple Informants, Self  ALPRAZolam (XANAX) 1 MG tablet [id number] Yes Take 1 mg by mouth 3 (three) times daily as needed. [provider] Taking Active Multiple Informants, Self  amLODipine (NORVASC) 5 MG tablet [id number] Yes Take 1 tablet (5 mg total) by mouth daily. Jearld Fenton, NP Taking Active   asenapine (SAPHRIS) 5 MG SUBL 24 hr tablet [id number] Yes Place 5 mg under the tongue every evening. [provider] Taking Active Multiple Informants, Self  aspirin 81 MG EC tablet [id number] Yes  Take 1 tablet (81 mg total) by mouth daily. Swallow whole. Jearld Fenton, NP Taking Active   bisoprolol (ZEBETA) 10 MG tablet [id number] Yes Take 1 tablet (10 mg total) by mouth daily. OFFICE VISIT NEEDED FOR ADDITIONAL REFILLS Jearld Fenton, NP Taking Active   buPROPion (WELLBUTRIN XL) 300 MG 24 hr tablet [id number] Yes Take 1 tablet (300 mg total) by mouth daily. Carrie Mew, MD Taking Active Multiple Informants, Self  busPIRone (BUSPAR) 15 MG tablet [id number] Yes Take 15 mg by mouth 2 (two) times daily.  [provider] Taking Active Multiple Informants, Self  docusate sodium (COLACE) 100 MG capsule [id number] Yes Take 100 mg by mouth daily. [provider] Taking Active   Dulaglutide 3 MG/0.5ML SOPN [id number] Yes Inject 0.5 mLs (3 mg total) subcutaneously once a week [provider] Taking Active   FARXIGA 10 MG TABS tablet [id number] Yes Take 10 mg by mouth daily. [provider] Taking Active Multiple Informants, Self  fluticasone (FLONASE) 50 MCG/ACT nasal spray [id number] Yes Place 1 spray into both nostrils daily. Jearld Fenton, NP Taking Active   fluticasone-salmeterol (ADVAIR) 250-50 MCG/ACT AEPB [id number] Yes Inhale 1 puff into the lungs 2 (two) times daily. [provider] Taking Active Multiple Informants, Self  gabapentin (NEURONTIN) 600 MG tablet [id number] Yes Take 600-900 mg by mouth 3 (three) times daily. 600 mg AM, 600 mg pm and 900 mg at bedtime [provider] Taking Active Multiple Informants, Self  GLOBAL EASE INJECT PEN NEEDLES 32G X  4 MM MISC [id number]  Use as directed with insulin Kendell Bane, NP  Active Multiple Informants, Self  insulin aspart (NOVOLOG) 100 UNIT/ML FlexPen [id number] Yes Inject 20 units before the meals, three times daily. Do not take after meals. [provider] Taking Active   Insulin Glargine Solostar (LANTUS) 100 UNIT/ML Solostar Pen [id number] Yes Inject 66 Units subcutaneously at  bedtime [provider] Taking Active   Insulin Pen Needle (BD PEN NEEDLE NANO U/F) 32G X 4 MM MISC [id number]  Inject 1 Device into the skin daily. Jearld Fenton, NP  Active Multiple Informants, Self  Insulin Syringe-Needle U-100 (INSULIN SYRINGE 1CC/31GX5/16") 31G X 5/16" 1 ML MISC [id number]  1 Syringe by Does not apply route in the morning, at noon, in the evening, and at bedtime. Jearld Fenton, NP  Active Multiple Informants, Self  LINZESS 290 MCG CAPS capsule [id number] Yes Take 1 capsule (290 mcg total) by mouth daily before breakfast. Jearld Fenton, NP Taking Active   lurasidone (LATUDA) 20 MG TABS tablet [id number] Yes Take 1 tablet by mouth daily. [provider] Taking Active   metFORMIN (GLUCOPHAGE) 1000 MG tablet [id number] Yes Take 1,000 mg by mouth 2 (two) times daily with a meal. [provider] Taking Active   mirabegron ER (MYRBETRIQ) 50 MG TB24 tablet [id number] Yes Take 1 tablet (50 mg total) by mouth daily. Bjorn Loser, MD Taking Active   montelukast (SINGULAIR) 10 MG tablet [id number] Yes Take 1 tablet (10 mg total) by mouth daily. Carrie Mew, MD Taking Active Multiple Informants, Self  Olopatadine HCl 0.2 % SOLN [id number] Yes Place 1 drop into both eyes daily. Malfi, Lupita Raider, FNP Taking Active Multiple Informants, Self  omeprazole (PRILOSEC) 40 MG capsule [id number] Yes Take 1 capsule (40 mg total) by mouth in the morning and at bedtime. Jearld Fenton, NP Taking Active   predniSONE (DELTASONE) 5 MG tablet [id number] Yes Take 1 tablet (5 mg total) by mouth once daily [provider] Taking Active   pregabalin (LYRICA) 50 MG capsule [id number] Yes Take 50 mg in the morning and 100 mg at night for a week then increase to 100 mg twice a day [provider] Taking Active   rosuvastatin (CRESTOR) 10 MG tablet [id number] Yes Take 1 tablet (10 mg total) by mouth daily. Jearld Fenton, NP Taking Active   TRAVATAN Z 0.004 % SOLN  ophthalmic solution [id number] Yes Place 1 drop into both eyes at bedtime. Carrie Mew, MD Taking Active Multiple Informants, Self            Pertinent Labs:  Lab Results  Component Value Date   HGBA1C 9.0 (H) 12/21/2021  Per shared record from [place] A1C 9.3% on 04/02/2022   Lab Results  Component Value Date   CREATININE 1.00 03/02/2022   BUN 23 03/02/2022   NA 139 03/02/2022   K 3.9 03/02/2022   CL 101 03/02/2022   CO2 28 03/02/2022    SDOH:  (Social Determinants of Health) assessments and interventions performed:    [place]  Review of patient past medical history, allergies, medications, health status, including review of consultants reports, laboratory and other test data, was performed as part of comprehensive evaluation and provision of chronic care management services.   Care Plan : PharmD - Medication Management/T2DM  Updates made by Rennis Petty, RPH-CPP since 04/10/2022 12:00 AM     Problem: Disease Progression      Long-Range Goal: Disease  Progression Prevented or Minimized   Start Date: 01/19/2022  Expected End Date: 04/19/2022  Priority: High  Note:   Current Barriers:  Unable to achieve control of blood sugar  Chronic Disease Management support and education needs related to managing HTN, GERD, major depressive disorder, Type II DM, history of CVA, diabetic neuropathy, GAD, OSA, HLD, constipation, obesity, COPD  Pharmacist Clinical Goal(s):  patient will achieve adherence to monitoring guidelines and medication adherence to achieve therapeutic efficacy achieve control of blood sugar as evidenced by A1C  through collaboration with PharmD and provider.    Interventions: 1:1 collaboration with Jearld Fenton, NP regarding development and update of comprehensive plan of care as evidenced by provider attestation and co-signature Inter-disciplinary care team collaboration (see longitudinal plan of  care)  T2DM: Uncontrolled; current treatment: Metformin 1000 mg twice daily Trulicity 3 mg weekly on Fridays Lantus 66 units nightly Novolog 20 units three times daily before meals Farxiga 10 mg daily Note patient currently taking prednisone 5 mg daily as prescribed by Pulmonology Placed call to [facility]inology on 6/7 to confirm latest dosing instruction for Lantus from Endocrinologist Today received a call back from [place], [place] with [facility] Endocrinology who confirms patient to take Lantus 66 units nightly.  Ask about request for continuous glucose monitor. Kieth Brightly states office is working on this paperwork and will send the order via [employer] Follow up with patient today, who reports that she received confirmation from clinic regarding Lantus dose yesterday and is taking Lantus 66 units nightly as directed Have encouraged patient to monitor home blood sugar three times daily as directed by Endocrinologist and to follow up with clinic for readings outside of established parameters or new symptoms, particularly if she has any episodes/symptoms of hypoglycemia   Patient Goals/Self-Care Activities patient will:  - take medications as prescribed as evidenced by patient report and record review - check glucose, document, and provide at future appointments - check blood pressure, document, and provide at future appointments       Plan: Telephone follow up appointment with care management team member scheduled for:  06/08/2022 at 11:15 AM  Wallace Cullens, PharmD, [place] [phone]

## 2022-04-10 NOTE — Patient Instructions (Signed)
Visit Information  Thank you for taking time to visit with me today. Please don't hesitate to contact me if I can be of assistance to you before our next scheduled telephone appointment.  Following are the goals we discussed today:   Goals Addressed             This Visit's Progress    Pharmacy Goals       Our goal A1c is less than 7%. This corresponds with fasting sugars less than 130 and 2 hour after meal sugars less than 180. Please check your blood sugar and keep a log of the results  Our goal bad cholesterol, or LDL, is less than 70 . This is why it is important to continue taking your rosuvastatin.  Feel free to call me with any questions or concerns. I look forward to our next call!   Wallace Cullens, PharmD, [place] [phone]         Our next appointment is by telephone on 06/08/2022 at 11:15 AM  Please call the care guide team at [phone] if you need to cancel or reschedule your appointment.    Patient verbalizes understanding of instructions and care plan provided today and agrees to view in [place]. Active MyChart status and patient understanding of how to access instructions and care plan via MyChart confirmed with patient.

## 2022-04-10 NOTE — Telephone Encounter (Signed)
Requested Prescriptions  Pending Prescriptions Disp Refills  . amLODipine (NORVASC) 5 MG tablet [Pharmacy Med Name: AMLODIPINE BESYLATE 5 MG TAB] 90 tablet 1    Sig: Take 1 tablet (5 mg total) by mouth daily.     Cardiovascular: Calcium Channel Blockers 2 Passed - 04/10/2022 12:33 PM      Passed - Last BP in normal range    BP Readings from Last 1 Encounters:  03/02/22 134/81         Passed - Last Heart Rate in normal range    Pulse Readings from Last 1 Encounters:  03/02/22 96         Passed - Valid encounter within last 6 months    Recent Outpatient Visits          1 month ago Encounter for general adult medical examination with abnormal findings   [facility], Coralie Keens, NP   3 months ago COPD exacerbation [facility])   [facility], Coralie Keens, NP   9 months ago Mixed diabetic hyperlipidemia associated with type 2 diabetes mellitus [facility])   [facility] [place], Coralie Keens, NP   1 year ago Uncontrolled type 2 diabetes mellitus with hyperglycemia [facility])   [facility] Olin Hauser, DO   1 year ago Encounter to establish care with new doctor   [place], FNP      Future Appointments            In 1 month [place], Coralie Keens, NP [facility], [place]   In 9 months [place], [facility]

## 2022-04-13 ENCOUNTER — Other Ambulatory Visit: Payer: Self-pay | Admitting: Internal Medicine

## 2022-04-13 ENCOUNTER — Telehealth: Payer: Medicare HMO

## 2022-04-13 ENCOUNTER — Ambulatory Visit: Payer: Self-pay

## 2022-04-13 DIAGNOSIS — E1165 Type 2 diabetes mellitus with hyperglycemia: Secondary | ICD-10-CM

## 2022-04-13 DIAGNOSIS — J432 Centrilobular emphysema: Secondary | ICD-10-CM

## 2022-04-13 DIAGNOSIS — M5412 Radiculopathy, cervical region: Secondary | ICD-10-CM

## 2022-04-13 DIAGNOSIS — Z9181 History of falling: Secondary | ICD-10-CM

## 2022-04-13 DIAGNOSIS — E1169 Type 2 diabetes mellitus with other specified complication: Secondary | ICD-10-CM

## 2022-04-13 DIAGNOSIS — I10 Essential (primary) hypertension: Secondary | ICD-10-CM

## 2022-04-13 DIAGNOSIS — F411 Generalized anxiety disorder: Secondary | ICD-10-CM

## 2022-04-13 DIAGNOSIS — M5416 Radiculopathy, lumbar region: Secondary | ICD-10-CM

## 2022-04-13 DIAGNOSIS — R32 Unspecified urinary incontinence: Secondary | ICD-10-CM

## 2022-04-13 DIAGNOSIS — F331 Major depressive disorder, recurrent, moderate: Secondary | ICD-10-CM

## 2022-04-13 NOTE — Chronic Care Management (AMB) (Signed)
Chronic Care Management   CCM RN Visit Note  04/13/2022 Name: Rachel Nielsen MRN: [id number] DOB: February 13, 1951  Subjective: Rachel Nielsen is a 71 y.o. year old female who is a primary care patient of Jearld Fenton, NP. The care management team was consulted for assistance with disease management and care coordination needs.    Engaged with patient by telephone for follow up visit in response to provider referral for case management and/or care coordination services.   Consent to Services:  The patient was given information about Chronic Care Management services, agreed to services, and gave verbal consent prior to initiation of services.  Please see initial visit note for detailed documentation.   Patient agreed to services and verbal consent obtained.   Assessment: Review of patient past medical history, allergies, medications, health status, including review of consultants reports, laboratory and other test data, was performed as part of comprehensive evaluation and provision of chronic care management services.   SDOH (Social Determinants of Health) assessments and interventions performed:    CCM Care Plan  Allergies  Allergen Reactions   Ace Inhibitors Cough    Outpatient Encounter Medications as of 04/13/2022  Medication Sig   ACCU-CHEK GUIDE test strip Use to check blood sugar up to twice a day as directed   Accu-Chek Softclix Lancets lancets Use to check blood sugar up to twice a day as directed   albuterol (PROVENTIL) (2.5 MG/3ML) 0.083% nebulizer solution Inhale 3 mLs (2.5 mg total) into the lungs every 4 (four) hours as needed for wheezing or shortness of breath.   albuterol (VENTOLIN HFA) 108 (90 Base) MCG/ACT inhaler Inhale 2-4 puffs by mouth every 4 hours as needed for wheezing, cough, and/or shortness of breath   ALPRAZolam (XANAX) 1 MG tablet Take 1 mg by mouth 3 (three) times daily as needed.   amLODipine (NORVASC) 5 MG tablet Take 1 tablet (5 mg total) by mouth daily.    asenapine (SAPHRIS) 5 MG SUBL 24 hr tablet Place 5 mg under the tongue every evening.   aspirin 81 MG EC tablet Take 1 tablet (81 mg total) by mouth daily. Swallow whole.   bisoprolol (ZEBETA) 10 MG tablet Take 1 tablet (10 mg total) by mouth daily. OFFICE VISIT NEEDED FOR ADDITIONAL REFILLS   buPROPion (WELLBUTRIN XL) 300 MG 24 hr tablet Take 1 tablet (300 mg total) by mouth daily.   busPIRone (BUSPAR) 15 MG tablet Take 15 mg by mouth 2 (two) times daily.    docusate sodium (COLACE) 100 MG capsule Take 100 mg by mouth daily.   Dulaglutide 3 MG/0.5ML SOPN Inject 0.5 mLs (3 mg total) subcutaneously once a week   FARXIGA 10 MG TABS tablet Take 10 mg by mouth daily.   fluticasone (FLONASE) 50 MCG/ACT nasal spray Place 1 spray into both nostrils daily.   fluticasone-salmeterol (ADVAIR) 250-50 MCG/ACT AEPB Inhale 1 puff into the lungs 2 (two) times daily.   gabapentin (NEURONTIN) 600 MG tablet Take 600-900 mg by mouth 3 (three) times daily. 600 mg AM, 600 mg pm and 900 mg at bedtime   GLOBAL EASE INJECT PEN NEEDLES 32G X 4 MM MISC Use as directed with insulin   insulin aspart (NOVOLOG) 100 UNIT/ML FlexPen Inject 20 units before the meals, three times daily. Do not take after meals.   Insulin Glargine Solostar (LANTUS) 100 UNIT/ML Solostar Pen Inject 66 Units subcutaneously at bedtime   Insulin Pen Needle (BD PEN NEEDLE NANO U/F) 32G X 4 MM MISC Inject 1  Device into the skin daily.   Insulin Syringe-Needle U-100 (INSULIN SYRINGE 1CC/31GX5/16") 31G X 5/16" 1 ML MISC 1 Syringe by Does not apply route in the morning, at noon, in the evening, and at bedtime.   LINZESS 290 MCG CAPS capsule Take 1 capsule (290 mcg total) by mouth daily before breakfast.   lurasidone (LATUDA) 20 MG TABS tablet Take 1 tablet by mouth daily.   metFORMIN (GLUCOPHAGE) 1000 MG tablet Take 1,000 mg by mouth 2 (two) times daily with a meal.   mirabegron ER (MYRBETRIQ) 50 MG TB24 tablet Take 1 tablet (50 mg total) by mouth daily.    montelukast (SINGULAIR) 10 MG tablet Take 1 tablet (10 mg total) by mouth daily.   Olopatadine HCl 0.2 % SOLN Place 1 drop into both eyes daily.   omeprazole (PRILOSEC) 40 MG capsule Take 1 capsule (40 mg total) by mouth in the morning and at bedtime.   predniSONE (DELTASONE) 5 MG tablet Take 1 tablet (5 mg total) by mouth once daily   pregabalin (LYRICA) 50 MG capsule Take 50 mg in the morning and 100 mg at night for a week then increase to 100 mg twice a day   rosuvastatin (CRESTOR) 10 MG tablet Take 1 tablet (10 mg total) by mouth daily.   TRAVATAN Z 0.004 % SOLN ophthalmic solution Place 1 drop into both eyes at bedtime.   No facility-administered encounter medications on file as of 04/13/2022.    Patient Active Problem List   Diagnosis Date Noted   Mixed diabetic hyperlipidemia associated with type 2 diabetes mellitus ([place]) 07/09/2021   Chronic constipation 07/09/2021   Cervical radiculopathy 07/09/2021   Lumbar radiculopathy 07/09/2021   Class 2 obesity due to excess calories with body mass index (BMI) of 37.0 to 37.9 in adult 07/09/2021   OSA (obstructive sleep apnea) 12/18/2020   Diabetic neuropathy ([place]) 12/17/2020   Centrilobular emphysema ([place]) 12/17/2020   GAD (generalized anxiety disorder) 12/17/2020   History of stroke 10/21/2020   Uncontrolled type 2 diabetes mellitus with hyperglycemia ([place]) 03/14/2019   Hypertension 12/21/2017   GERD (gastroesophageal reflux disease) 12/21/2017   Major depressive disorder, recurrent, moderate ([place]) 12/21/2017    Conditions to be addressed/monitored:HTN, HLD, COPD, DMII, Anxiety, Depression, and Chronic pain, falls and safety concerns, OAB and urinary incontinence  Care Plan : RNCM: General Plan of Care (Adult) for Chronic Disease Management and Care Coordination Needs  Updates made by Vanita Ingles, RN since 04/13/2022 12:00 AM     Problem: RNCM: Development of Plan of Care for Chronic Disease Management (HTN, HLD, DM, COPD,  Depression, Anxiety, Chronic Pain and Falls)   Priority: High     Long-Range Goal: RNCM: Effective Management  of Plan of Care for Chronic Disease Management (HTN, HLD, DM, COPD, Depression, Anxiety, Chronic Pain and Falls)   Start Date: 01/12/2022  Expected End Date: 01/13/2023  Priority: High  Note:   Current Barriers:  Knowledge Deficits related to plan of care for management of HTN, HLD, COPD, DMII, Chronic Pain, and Depression, Anxiety, Falls prevention  Care Coordination needs related to Mental Health Concerns   Chronic Disease Management support and education needs related to HTN, HLD, COPD, DMII, Chronic Pain, and Depression, Anxiety, Falls prevention   RNCM Clinical Goal(s):  Patient will verbalize basic understanding of HTN, HLD, COPD, DMII, Anxiety, Depression, and Chronic pain and falls disease process and self health management plan as evidenced by routine office visits, lab testing, VS stable, compliance with the plan of  care and dietary restrictions, and working with the CCM team to effectively manage health and well being.  demonstrate understanding of rationale for each prescribed medication as evidenced by compliance with medications and calling for refills before running out of medications     attend all scheduled medical appointments: 05-13-2022 at 10 am and with specialist also as evidenced by keeping appointments and calling for schedule change needs         demonstrate improved and ongoing health management independence as evidenced by no exacerbations in chronic conditions, stable VS and labs, and effective management of chronic conditions        demonstrate ongoing self health care management ability effectively manage Chronic conditions  as evidenced by  working with the CCM team through collaboration with Consulting civil engineer, provider, and care team.   Interventions: 1:1 collaboration with primary care provider regarding development and update of comprehensive plan of care  as evidenced by provider attestation and co-signature Inter-disciplinary care team collaboration (see longitudinal plan of care) Evaluation of current treatment plan related to  self management and patient's adherence to plan as established by provider   COPD: (Status: Goal on Track (progressing): YES.) Long Term Goal  Reviewed medications with patient, including use of prescribed maintenance and rescue inhalers, and provided instruction on medication management and the importance of adherence. 04-13-2022: The patient is compliant with medications. Is taking prednisone daily and this is impacting her blood sugars. States her breathing is stable at this time. Provided patient with basic written and verbal COPD education on self care/management/and exacerbation prevention. 04-13-2022: Has good understanding of COPD and how to manage.  Advised patient to track and manage COPD triggers. 04-13-2022: Review of factors that contribute to increase risk of exacerbations of COPD. Provided written and verbal instructions on pursed lip breathing and utilized returned demonstration as teach back Provided instruction about proper use of medications used for management of COPD including inhalers. 04-13-2022: The patient is compliant with her medications.  Advised patient to self assesses COPD action plan zone and make appointment with provider if in the yellow zone for 48 hours without improvement. 04-13-2022: Review with the patient Advised patient to engage in light exercise as tolerated 3-5 days a week to aid in the the management of COPD. 04-13-2022: Review of pacing activities. The patient is limited in her ability to do a lot of exercise due to her chronic pain. Education and support given Provided education about and advised patient to utilize infection prevention strategies to reduce risk of respiratory infection. 04-13-2022: Review of increase risk factors for infections and staying safe from potential  infections. Discussed the importance of adequate rest and management of fatigue with COPD. 04-13-2022: Review of sleep patterns and pacing activity.  Diabetes:  (Status: Goal on Track (progressing): YES.) Long Term Goal   Lab Results  Component Value Date   HGBA1C 9.0 (H) 12/21/2021  04-02-2022 at the endocrinology office 9.3%, before that at endocrinology in February 9.4%  Assessed patient's understanding of A1c goal: <7%. 04-13-2022: Review of the goal for <7.0% Provided education to patient about basic DM disease process. 04-13-2022: Review and support given. The patient states that she is having a hard time with keeping DM stable due to taking prednisone for her COPD. Recent adjustments made from her endocrinologist; Reviewed medications with patient and discussed importance of medication adherence. 01-12-2022: The patient with recent changes in her medications with the endocrinologist. The patient states she has a good understanding of how to take  her medications. Education and support given. 04-13-2022: The patient is taking medication as directed.  Review of recent changes and discussion on her levels. The patient is also working with the pharm D on a regular basis. The patient states that she is concerned because of the prednisone she takes but she knows this helps her breathing. Encouraged the patient to discuss her concerns with endocrinologist and her pulmonary provider.        Reviewed prescribed diet with patient heart healthy/ADA diet. 04-13-2022: Review of  heart healthy/ADA diet. ; Counseled on importance of regular laboratory monitoring as prescribed. 04-13-2022: Review of monitoring for A1C levels on a consistent basis. Has on a regular basis. Most recent on 04-02-2022 ;        Discussed plans with patient for ongoing care management follow up and provided patient with direct contact information for care management team;      Provided patient with written educational materials related to hypo  and hyperglycemia and importance of correct treatment. 01-12-2022: The patient denies any lows but states her blood sugars have been in the 200's, they were in the 200's but endocrinologist made new changes recently. 04-13-2022: Is taking prednisone and states that her blood sugars have been elevated but she is compensating with monitoring her dietary intake and watching what she eats. Denies any lows and has had some readings over 200. Continues to work with endocrinologist due to chronic use of prednisone for pulmonary issues.        Reviewed scheduled/upcoming provider appointments including: 05-13-2022 at 10 am;         Advised patient, providing education and rationale, to check cbg before meals and at bedtime and record. 02-23-2022: The patient states that she takes her blood sugars BID. She states that they are doing better and states that her average is about 200.She is mindful when taking prednisone that her levels are higher but she is monitoring what she eats better. Review of goal of blood sugars fasting is <130 and post prandial is <180. 04-13-2022: The patient states that her blood sugars are ranging from 126 to 136 but even still sometimes >200. The patient states that she knows she is having higher readings due to the prednisone use. Review of goals <130 fasting and <180 post prandial. The patient verbalized understanding.     call provider for findings outside established parameters;       Referral made to pharmacy team for assistance with medications management and reconciliation. 04-13-2022: The patient has ongoing support and education from the pharm D.        Review of patient status, including review of consultants reports, relevant laboratory and other test results, and medications completed;       Advised patient to discuss changes in DM plan of care, new questions or concerns with provider;       Falls:  (Status: Goal on Track (progressing): YES.) Long Term Goal  Provided written and  verbal education re: potential causes of falls and Fall prevention strategies Reviewed medications and discussed potential side effects of medications such as dizziness and frequent urination. 04-13-2022: Review of medications. The patient states since medication for urinary incontinence she is doing better with not having frequent urination.  Advised patient of importance of notifying provider of falls. 04-13-2022: Advised the patient to let the office know of any new falls. Reminder given.  Assessed for signs and symptoms of orthostatic hypotension. 04-13-2022: The patient denies any issues with orthostatic hypotension  Assessed for  falls since last encounter. 01-12-2022: The patient states that she loses her balance a lot. Last known fall was 12-04-2021. The patient uses a cane and also has a walker. Review of safety and fall precautions. 02-23-2022: Denies any new falls at this time. Will continue to monitor for changes. 04-13-2022: The patient states she had a fall on May 6th. Did not hurt her self. Education and support given.  Assessed patients knowledge of fall risk prevention secondary to previously provided education Provided patient information for fall alert systems Assessed working status of life alert bracelet and patient adherence Advised patient to discuss new falls and safety concerns with provider  Anxiety and Depression   (Status: Goal on Track (progressing): YES.) Long Term Goal  Evaluation of current treatment plan related to Anxiety and Depression, Mental Health Concerns  self-management and patient's adherence to plan as established by provider. 01-26-2022: The patient is having pain in her neck and hands and this is causing her to have more anxiety and depression. She left a VM after hours on 01-23-2022 for the [facility] asking for assistance with her provider who gave her injections. The patient was headed to an appointment but was able to take the information down as she was not driving. She  will follow up with Dr. Alba Destine for new injections if possible. 02-23-2022: The patient states that she is hurting a lot today and she will be glad when she can get her injection of for pain. She says she is having an okay day. She has a lot of chronic conditions impacting her care. She states that she will continue to follow the plan of care. She is thankful for the support of the team. Will continue to monitor for changes. 04-13-2022: The patient is having a good day today. She is still dealing with a lot of pain and this sent her to the ER on 03-02-2022 when she told the doctor that it hurt so bad sometimes that she wished she could take all of her pills and go to sleep. They sent her to the ER for evaluation of suicidal ideations. The patient denies this and says she was just trying to explain to the doctor how bad her pain level was. She spent 6 hours in the EF and was released to go home. She states that she will not make that mistake again. She states she is concerned because nothing is helping the pain but she would not hurt herself. Discussed ways to help her when she is in a lot of pain. She has a dog named "Barnabas Lister" and she says that he is a lot of comfort to her and when she is in pain he sticks by her. Rubbing him helps her as it takes her mind off of the pain. The patient states that Barnabas Lister also alerted her son when she fell in the bathtub. She is thankful for her son and Barnabas Lister. She also is thankful for the support of the CCM team. Empathetic listening and support given.  Discussed plans with patient for ongoing care management follow up and provided patient with direct contact information for care management team Advised patient to call the office for changes in mood, anxiety, depression, or new mental health concerns. 04-13-2022: Review of ways to deal with changes in mood, increased anxiety and depression.; Provided education to patient re: support from CCM team and LCSW if needed, mindfulness, and helped  the patient reset her my Chart password as the patient has been unable to get into  her my Chart; Reviewed medications with patient and discussed compliance, the patient is weaning down off of her Cymbalta and is doing well at this time with the changes; Provided patient with mindfulness and stress management  educational materials related to anxiety and depression ; Reviewed scheduled/upcoming provider appointments including 05-13-2022 at 10 am; Discussed plans with patient for ongoing care management follow up and provided patient with direct contact information for care management team; Advised patient to discuss mental health concerns, changes with anxiety and depression  with provider; Screening for signs and symptoms of depression related to chronic disease state;  Assessed social determinant of health barriers;   Hyperlipidemia:  (Status: New goal. Goal on Track (progressing): YES.) Long Term Goal  Lab Results  Component Value Date   CHOL 104 02/10/2022   HDL 58 02/10/2022   LDLCALC 26 02/10/2022   TRIG 112 02/10/2022   CHOLHDL 1.8 02/10/2022     Medication review performed; medication list updated in electronic medical record. 04-13-2022: The patient takes Crestor 10 mg QD and is compliant with medications  Provider established cholesterol goals reviewed. 04-13-2022: The patient is at goal. The patient praised for good readings.  Counseled on importance of regular laboratory monitoring as prescribed. 04-13-2022: Education provided for the need to have regular lab work drawn; Provided HLD Scientist, clinical (histocompatibility and immunogenetics); Reviewed role and benefits of statin for ASCVD risk reduction; Discussed strategies to manage statin-induced myalgias; Reviewed importance of limiting foods high in cholesterol. 04-13-2022: Review with the patient heart healthy/ADA diet Reviewed exercise goals and target of 150 minutes per week;  Hypertension: (Status: New goal. Goal on Track (progressing): YES.) Long Term Goal   Last practice recorded BP readings:  BP Readings from Last 3 Encounters:  03/02/22 134/81  02/10/22 136/84  01/06/22 127/71  Most recent eGFR/CrCl:  Lab Results  Component Value Date   EGFR 66 02/10/2022    No components found for: CRCL  Evaluation of current treatment plan related to hypertension self management and patient's adherence to plan as established by provider. 04-13-2022: States her blood pressures go up when she is experiencing pain and discomfort. Education and support given. Blood pressure is more stable and she states she is doing okay today. Denies any acute findings related to HTN or heart health. The patient is having a good day today ;   Provided education to patient re: stroke prevention, s/s of heart attack and stroke; Reviewed prescribed diet heart healthy/ADA diet  Reviewed medications with patient and discussed importance of compliance. 04-13-2022: The patient is compliant with medications.   Discussed plans with patient for ongoing care management follow up and provided patient with direct contact information for care management team; Advised patient, providing education and rationale, to monitor blood pressure daily and record, calling PCP for findings outside established parameters;  Advised patient to discuss blood pressure changes and trends  with provider; Provided education on prescribed diet heart healthy/ADA diet. 04-13-2022: The patient is compliant with a heart healthy/ADA diet ;  Discussed complications of poorly controlled blood pressure such as heart disease, stroke, circulatory complications, vision complications, kidney impairment, sexual dysfunction;   Pain:  (Status: New goal. Goal on Track (progressing): YES.) Long Term Goal  Pain assessment performed. 01-12-2022: The patient rates her pain in her neck at an 8 today and says this is good for her. She also has neuropathy pain in her feet and hands. She denies any acute findings. 01-26-2022: The patient is  rating her pain at an 8 in her  neck today and 7 in her hands. She said her shot has wore off. She has so many doctors she reached out to the [facility] for assistance with her provider for [facility] and the [facility] was able to assist with information. 02-23-2022: The patient is having generalized pain and says her neck pain can be as much as a 10. Generalized she is at an 8. She has a follow up appointment with the specialist on May 9th for an injection. She says these injections are very helpful. 04-13-2022: The patient is rating her pain at an 8 today on a scale of 0-10. The patient states that she deals daily with chronic neck pain and discomfort. She goes to see Dr. Melrose Nakayama on Friday and is hopeful for answers. Medications reviewed. 01-12-2022: States it is time for her to get more shots in her neck to help with the pain. 01-26-2022: Taking medications as ordered. Needs shots in her neck due to the shots "wearing off".  The patient will reach out to the provider for a new appointment. 02-23-2022: Is compliant with her medications. She says that they need to help her with her arthritis pain. Education on discussing with the provider what can help with arthritis pain and discomfort. 04-13-2022: The patient states that she is taking her medications as directed but still having a lot of pain. She has been seeing Dr. Alba Destine and PT was ordered but the PT staff states that with her level of pain they cannot do PT. She will discuss with Dr. Melrose Nakayama on Friday.  Reviewed provider established plan for pain management. 01-26-2022: Sees Dr. Alba Destine at [facility] [address] [facility] for injections. Number is [phone]. Information provided for the patient today. Next appointment is on 03-10-2022 for injections. 04-13-2022: The patient does not feel Dr. Alba Destine has listened to her effectively for her pain management. When she saw her in May she was sent to the ER because they thought she was suicidal. She tried to tell them she was not but they said she  had a plan and was sent anyway. The patient sees Dr. Melrose Nakayama on Friday and wants to talk to him about a new provider or another opinion. The patient states that she has never had surgery. Discussed possible testing like MRI or other test to determine where her pain is originating.  Discussed importance of adherence to all scheduled medical appointments.04-10-2022 next appointment with Dr. Melrose Nakayama. Sees pcp in July.  Counseled on the importance of reporting any/all new or changed pain symptoms or management strategies to pain management provider. 04-13-2022: Education and support given.  Advised patient to report to care team affect of pain on daily activities; Discussed use of relaxation techniques and/or diversional activities to assist with pain reduction (distraction, imagery, relaxation, massage, acupressure, TENS, heat, and cold application; Reviewed with patient prescribed pharmacological and nonpharmacological pain relief strategies; Advised patient to discuss unresolved pain, changes in level or intensity of pain  with provider;   Overactive bladder and Urinary Incontinence   (Status: Goal on Track (progressing): YES.) Long Term Goal  Evaluation of current treatment plan related to Overactive Bladder and urinary incontinence  ,  self-management and patient's adherence to plan as established by provider. 04-13-2022: The patient states that she has better outcomes with her OAB and incontinence since starting on medications. She is still having some leakage but not as much and is not waking up at night like she was with urgency. She feels the medications are helping her.  Discussed plans  with patient for ongoing care management follow up and provided patient with direct contact information for care management team Advised patient to call the office or specialist for changes in urinary patterns, sx and sx of UTI, new questions or concerns related to urinary health; Provided education to patient re: good  hygiene, monitoring for sx and sx of UTI, calling the office for changes in urinary health. 04-13-2022: Education and review given; Reviewed medications with patient and discussed compliance. The patient states that she is not having as much leakage with taking Myrbetriq 50 mg. It was increased from 25 to 50 mg. 04-13-2022: The patient is compliant with medications and feels the medications are helping her. ; Provided patient with Urinary health educational materials related to UTI and Overactive bladder; Reviewed scheduled/upcoming provider appointments including 05-13-2022 at 10 am; Discussed plans with patient for ongoing care management follow up and provided patient with direct contact information for care management team; Advised patient to discuss questions or concerns about her urinary health with provider;   Patient Goals/Self-Care Activities: Take medications as prescribed   Attend all scheduled provider appointments Call pharmacy for medication refills 3-7 days in advance of running out of medications Attend church or other social activities Perform all self care activities independently  Perform IADL's (shopping, preparing meals, housekeeping, managing finances) independently Call provider office for new concerns or questions  Work with the social worker to address care coordination needs and will continue to work with the clinical team to address health care and disease management related needs call the Suicide and Crisis Lifeline: 988 call the [place] National Suicide Prevention Lifeline: (954)286-7558 or TTY: (416)726-3199 TTY (682)695-8210) to talk to a trained counselor call 1-800-273-TALK (toll free, 24 hour hotline) if experiencing a Mental Health or [place]  keep appointment with eye doctor check blood sugar at prescribed times: twice daily, when you have symptoms of low or high blood sugar, and before and after exercise check feet daily for cuts, sores or  redness enter blood sugar readings and medication or insulin into daily log take the blood sugar log to all doctor visits trim toenails straight across drink 6 to 8 glasses of water each day eat fish at least once per week fill half of plate with vegetables limit fast food meals to no more than 1 per week manage portion size prepare main meal at home 3 to 5 days each week read food labels for fat, fiber, carbohydrates and portion size keep feet up while sitting wash and dry feet carefully every day wear comfortable, cotton socks wear comfortable, well-fitting shoes avoid second hand smoke eliminate smoking in my home identify and avoid work-related triggers identify and remove indoor air pollutants limit outdoor activity during cold weather listen for public air quality announcements every day do breathing exercises every day arrange respite care for caregiver attend pulmonary rehabilitation begin a symptom diary develop a rescue plan eliminate symptom triggers at home follow rescue plan if symptoms flare-up use an extra pillow to sleep develop a new routine to improve sleep don't eat or exercise right before bedtime get at least 7 to 8 hours of sleep at night use devices that will help like a cane, sock-puller or reacher do exercises in a comfortable position that makes breathing as easy as possible check blood pressure weekly choose a place to take my blood pressure (home, clinic or office, retail store) write blood pressure results in a log or diary learn about high blood pressure keep a  blood pressure log take blood pressure log to all doctor appointments call doctor for signs and symptoms of high blood pressure develop an action plan for high blood pressure keep all doctor appointments take medications for blood pressure exactly as prescribed report new symptoms to your doctor eat more whole grains, fruits and vegetables, lean meats and healthy fats - call for  medicine refill 2 or 3 days before it runs out - take all medications exactly as prescribed - call doctor with any symptoms you believe are related to your medicine - call doctor when you experience any new symptoms - go to all doctor appointments as scheduled - adhere to prescribed diet: Heart Healthy/ADA diet       Plan:Telephone follow up appointment with care management team member scheduled for:  06-22-2022 at 230 pm   [place], MSN, [place] Mobile: [phone]

## 2022-04-13 NOTE — Patient Instructions (Signed)
Visit Information  Thank you for taking time to visit with me today. Please don't hesitate to contact me if I can be of assistance to you before our next scheduled telephone appointment.  Following are the goals we discussed today:  COPD: (Status: Goal on Track (progressing): YES.) Long Term Goal  Reviewed medications with patient, including use of prescribed maintenance and rescue inhalers, and provided instruction on medication management and the importance of adherence. 04-13-2022: The patient is compliant with medications. Is taking prednisone daily and this is impacting her blood sugars. States her breathing is stable at this time. Provided patient with basic written and verbal COPD education on self care/management/and exacerbation prevention. 04-13-2022: Has good understanding of COPD and how to manage.  Advised patient to track and manage COPD triggers. 04-13-2022: Review of factors that contribute to increase risk of exacerbations of COPD. Provided written and verbal instructions on pursed lip breathing and utilized returned demonstration as teach back Provided instruction about proper use of medications used for management of COPD including inhalers. 04-13-2022: The patient is compliant with her medications.  Advised patient to self assesses COPD action plan zone and make appointment with provider if in the yellow zone for 48 hours without improvement. 04-13-2022: Review with the patient Advised patient to engage in light exercise as tolerated 3-5 days a week to aid in the the management of COPD. 04-13-2022: Review of pacing activities. The patient is limited in her ability to do a lot of exercise due to her chronic pain. Education and support given Provided education about and advised patient to utilize infection prevention strategies to reduce risk of respiratory infection. 04-13-2022: Review of increase risk factors for infections and staying safe from potential infections. Discussed the  importance of adequate rest and management of fatigue with COPD. 04-13-2022: Review of sleep patterns and pacing activity.   Diabetes:  (Status: Goal on Track (progressing): YES.) Long Term Goal         Lab Results  Component Value Date    HGBA1C 9.0 (H) 12/21/2021  04-02-2022 at the endocrinology office 9.3%, before that at endocrinology in February 9.4%  Assessed patient's understanding of A1c goal: <7%. 04-13-2022: Review of the goal for <7.0% Provided education to patient about basic DM disease process. 04-13-2022: Review and support given. The patient states that she is having a hard time with keeping DM stable due to taking prednisone for her COPD. Recent adjustments made from her endocrinologist; Reviewed medications with patient and discussed importance of medication adherence. 01-12-2022: The patient with recent changes in her medications with the endocrinologist. The patient states she has a good understanding of how to take her medications. Education and support given. 04-13-2022: The patient is taking medication as directed.  Review of recent changes and discussion on her levels. The patient is also working with the pharm D on a regular basis. The patient states that she is concerned because of the prednisone she takes but she knows this helps her breathing. Encouraged the patient to discuss her concerns with endocrinologist and her pulmonary provider.        Reviewed prescribed diet with patient heart healthy/ADA diet. 04-13-2022: Review of  heart healthy/ADA diet. ; Counseled on importance of regular laboratory monitoring as prescribed. 04-13-2022: Review of monitoring for A1C levels on a consistent basis. Has on a regular basis. Most recent on 04-02-2022 ;        Discussed plans with patient for ongoing care management follow up and provided patient with direct contact  information for care management team;      Provided patient with written educational materials related to hypo and hyperglycemia  and importance of correct treatment. 01-12-2022: The patient denies any lows but states her blood sugars have been in the 200's, they were in the 200's but endocrinologist made new changes recently. 04-13-2022: Is taking prednisone and states that her blood sugars have been elevated but she is compensating with monitoring her dietary intake and watching what she eats. Denies any lows and has had some readings over 200. Continues to work with endocrinologist due to chronic use of prednisone for pulmonary issues.        Reviewed scheduled/upcoming provider appointments including: 05-13-2022 at 10 am;         Advised patient, providing education and rationale, to check cbg before meals and at bedtime and record. 02-23-2022: The patient states that she takes her blood sugars BID. She states that they are doing better and states that her average is about 200.She is mindful when taking prednisone that her levels are higher but she is monitoring what she eats better. Review of goal of blood sugars fasting is <130 and post prandial is <180. 04-13-2022: The patient states that her blood sugars are ranging from 126 to 136 but even still sometimes >200. The patient states that she knows she is having higher readings due to the prednisone use. Review of goals <130 fasting and <180 post prandial. The patient verbalized understanding.     call provider for findings outside established parameters;       Referral made to pharmacy team for assistance with medications management and reconciliation. 04-13-2022: The patient has ongoing support and education from the pharm D.        Review of patient status, including review of consultants reports, relevant laboratory and other test results, and medications completed;       Advised patient to discuss changes in DM plan of care, new questions or concerns with provider;        Falls:  (Status: Goal on Track (progressing): YES.) Long Term Goal  Provided written and verbal education re:  potential causes of falls and Fall prevention strategies Reviewed medications and discussed potential side effects of medications such as dizziness and frequent urination. 04-13-2022: Review of medications. The patient states since medication for urinary incontinence she is doing better with not having frequent urination.  Advised patient of importance of notifying provider of falls. 04-13-2022: Advised the patient to let the office know of any new falls. Reminder given.  Assessed for signs and symptoms of orthostatic hypotension. 04-13-2022: The patient denies any issues with orthostatic hypotension  Assessed for falls since last encounter. 01-12-2022: The patient states that she loses her balance a lot. Last known fall was 12-04-2021. The patient uses a cane and also has a walker. Review of safety and fall precautions. 02-23-2022: Denies any new falls at this time. Will continue to monitor for changes. 04-13-2022: The patient states she had a fall on May 6th. Did not hurt her self. Education and support given.  Assessed patients knowledge of fall risk prevention secondary to previously provided education Provided patient information for fall alert systems Assessed working status of life alert bracelet and patient adherence Advised patient to discuss new falls and safety concerns with provider   Anxiety and Depression   (Status: Goal on Track (progressing): YES.) Long Term Goal  Evaluation of current treatment plan related to Anxiety and Depression, Mental Health Concerns  self-management and patient's  adherence to plan as established by provider. 01-26-2022: The patient is having pain in her neck and hands and this is causing her to have more anxiety and depression. She left a VM after hours on 01-23-2022 for the [facility] asking for assistance with her provider who gave her injections. The patient was headed to an appointment but was able to take the information down as she was not driving. She will follow up with  Dr. Alba Destine for new injections if possible. 02-23-2022: The patient states that she is hurting a lot today and she will be glad when she can get her injection of for pain. She says she is having an okay day. She has a lot of chronic conditions impacting her care. She states that she will continue to follow the plan of care. She is thankful for the support of the team. Will continue to monitor for changes. 04-13-2022: The patient is having a good day today. She is still dealing with a lot of pain and this sent her to the ER on 03-02-2022 when she told the doctor that it hurt so bad sometimes that she wished she could take all of her pills and go to sleep. They sent her to the ER for evaluation of suicidal ideations. The patient denies this and says she was just trying to explain to the doctor how bad her pain level was. She spent 6 hours in the EF and was released to go home. She states that she will not make that mistake again. She states she is concerned because nothing is helping the pain but she would not hurt herself. Discussed ways to help her when she is in a lot of pain. She has a dog named "Barnabas Lister" and she says that he is a lot of comfort to her and when she is in pain he sticks by her. Rubbing him helps her as it takes her mind off of the pain. The patient states that Barnabas Lister also alerted her son when she fell in the bathtub. She is thankful for her son and Barnabas Lister. She also is thankful for the support of the CCM team. Empathetic listening and support given.  Discussed plans with patient for ongoing care management follow up and provided patient with direct contact information for care management team Advised patient to call the office for changes in mood, anxiety, depression, or new mental health concerns. 04-13-2022: Review of ways to deal with changes in mood, increased anxiety and depression.; Provided education to patient re: support from CCM team and LCSW if needed, mindfulness, and helped the patient reset her  my Chart password as the patient has been unable to get into her my Chart; Reviewed medications with patient and discussed compliance, the patient is weaning down off of her Cymbalta and is doing well at this time with the changes; Provided patient with mindfulness and stress management  educational materials related to anxiety and depression ; Reviewed scheduled/upcoming provider appointments including 05-13-2022 at 10 am; Discussed plans with patient for ongoing care management follow up and provided patient with direct contact information for care management team; Advised patient to discuss mental health concerns, changes with anxiety and depression  with provider; Screening for signs and symptoms of depression related to chronic disease state;  Assessed social determinant of health barriers;    Hyperlipidemia:  (Status: New goal. Goal on Track (progressing): YES.) Long Term Goal       Lab Results  Component Value Date    CHOL 104 02/10/2022  HDL 58 02/10/2022    LDLCALC 26 02/10/2022    TRIG 112 02/10/2022    CHOLHDL 1.8 02/10/2022      Medication review performed; medication list updated in electronic medical record. 04-13-2022: The patient takes Crestor 10 mg QD and is compliant with medications  Provider established cholesterol goals reviewed. 04-13-2022: The patient is at goal. The patient praised for good readings.  Counseled on importance of regular laboratory monitoring as prescribed. 04-13-2022: Education provided for the need to have regular lab work drawn; Provided HLD Scientist, clinical (histocompatibility and immunogenetics); Reviewed role and benefits of statin for ASCVD risk reduction; Discussed strategies to manage statin-induced myalgias; Reviewed importance of limiting foods high in cholesterol. 04-13-2022: Review with the patient heart healthy/ADA diet Reviewed exercise goals and target of 150 minutes per week;   Hypertension: (Status: New goal. Goal on Track (progressing): YES.) Long Term Goal  Last  practice recorded BP readings:     BP Readings from Last 3 Encounters:  03/02/22 134/81  02/10/22 136/84  01/06/22 127/71  Most recent eGFR/CrCl:       Lab Results  Component Value Date    EGFR 66 02/10/2022    No components found for: CRCL   Evaluation of current treatment plan related to hypertension self management and patient's adherence to plan as established by provider. 04-13-2022: States her blood pressures go up when she is experiencing pain and discomfort. Education and support given. Blood pressure is more stable and she states she is doing okay today. Denies any acute findings related to HTN or heart health. The patient is having a good day today ;   Provided education to patient re: stroke prevention, s/s of heart attack and stroke; Reviewed prescribed diet heart healthy/ADA diet  Reviewed medications with patient and discussed importance of compliance. 04-13-2022: The patient is compliant with medications.   Discussed plans with patient for ongoing care management follow up and provided patient with direct contact information for care management team; Advised patient, providing education and rationale, to monitor blood pressure daily and record, calling PCP for findings outside established parameters;  Advised patient to discuss blood pressure changes and trends  with provider; Provided education on prescribed diet heart healthy/ADA diet. 04-13-2022: The patient is compliant with a heart healthy/ADA diet ;  Discussed complications of poorly controlled blood pressure such as heart disease, stroke, circulatory complications, vision complications, kidney impairment, sexual dysfunction;    Pain:  (Status: New goal. Goal on Track (progressing): YES.) Long Term Goal  Pain assessment performed. 01-12-2022: The patient rates her pain in her neck at an 8 today and says this is good for her. She also has neuropathy pain in her feet and hands. She denies any acute findings. 01-26-2022: The  patient is rating her pain at an 8 in her neck today and 7 in her hands. She said her shot has wore off. She has so many doctors she reached out to the [facility] for assistance with her provider for [facility] and the [facility] was able to assist with information. 02-23-2022: The patient is having generalized pain and says her neck pain can be as much as a 10. Generalized she is at an 8. She has a follow up appointment with the specialist on May 9th for an injection. She says these injections are very helpful. 04-13-2022: The patient is rating her pain at an 8 today on a scale of 0-10. The patient states that she deals daily with chronic neck pain and discomfort. She goes to see Dr.  Potter on Friday and is hopeful for answers. Medications reviewed. 01-12-2022: States it is time for her to get more shots in her neck to help with the pain. 01-26-2022: Taking medications as ordered. Needs shots in her neck due to the shots "wearing off".  The patient will reach out to the provider for a new appointment. 02-23-2022: Is compliant with her medications. She says that they need to help her with her arthritis pain. Education on discussing with the provider what can help with arthritis pain and discomfort. 04-13-2022: The patient states that she is taking her medications as directed but still having a lot of pain. She has been seeing Dr. Alba Destine and PT was ordered but the PT staff states that with her level of pain they cannot do PT. She will discuss with Dr. Melrose Nakayama on Friday.  Reviewed provider established plan for pain management. 01-26-2022: Sees Dr. Alba Destine at [facility] on [facility] for injections. Number is [phone]. Information provided for the patient today. Next appointment is on 03-10-2022 for injections. 04-13-2022: The patient does not feel Dr. Alba Destine has listened to her effectively for her pain management. When she saw her in May she was sent to the ER because they thought she was suicidal. She tried to tell them she was not but they  said she had a plan and was sent anyway. The patient sees Dr. Melrose Nakayama on Friday and wants to talk to him about a new provider or another opinion. The patient states that she has never had surgery. Discussed possible testing like MRI or other test to determine where her pain is originating.  Discussed importance of adherence to all scheduled medical appointments.04-10-2022 next appointment with Dr. Melrose Nakayama. Sees pcp in July.  Counseled on the importance of reporting any/all new or changed pain symptoms or management strategies to pain management provider. 04-13-2022: Education and support given.  Advised patient to report to care team affect of pain on daily activities; Discussed use of relaxation techniques and/or diversional activities to assist with pain reduction (distraction, imagery, relaxation, massage, acupressure, TENS, heat, and cold application; Reviewed with patient prescribed pharmacological and nonpharmacological pain relief strategies; Advised patient to discuss unresolved pain, changes in level or intensity of pain  with provider;     Overactive bladder and Urinary Incontinence   (Status: Goal on Track (progressing): YES.) Long Term Goal  Evaluation of current treatment plan related to Overactive Bladder and urinary incontinence  ,  self-management and patient's adherence to plan as established by provider. 04-13-2022: The patient states that she has better outcomes with her OAB and incontinence since starting on medications. She is still having some leakage but not as much and is not waking up at night like she was with urgency. She feels the medications are helping her.  Discussed plans with patient for ongoing care management follow up and provided patient with direct contact information for care management team Advised patient to call the office or specialist for changes in urinary patterns, sx and sx of UTI, new questions or concerns related to urinary health; Provided education to patient  re: good hygiene, monitoring for sx and sx of UTI, calling the office for changes in urinary health. 04-13-2022: Education and review given; Reviewed medications with patient and discussed compliance. The patient states that she is not having as much leakage with taking Myrbetriq 50 mg. It was increased from 25 to 50 mg. 04-13-2022: The patient is compliant with medications and feels the medications are helping her. ; Provided  patient with Urinary health educational materials related to UTI and Overactive bladder; Reviewed scheduled/upcoming provider appointments including 05-13-2022 at 10 am; Discussed plans with patient for ongoing care management follow up and provided patient with direct contact information for care management team; Advised patient to discuss questions or concerns about her urinary health with provider;   Our next appointment is by telephone on 06-22-2022 at 230 pm  Please call the care guide team at [phone] if you need to cancel or reschedule your appointment.   If you are experiencing a Mental Health or [place] or need someone to talk to, please call the Suicide and Crisis Lifeline: 988 call the [place] National Suicide Prevention Lifeline: 5147239998 or TTY: 470-679-6567 TTY 603 152 4507) to talk to a trained counselor call 1-800-273-TALK (toll free, 24 hour hotline)   Patient verbalizes understanding of instructions and care plan provided today and agrees to view in [place]. Active MyChart status and patient understanding of how to access instructions and care plan via MyChart confirmed with patient.     Noreene Larsson RN, MSN, [place] Mobile: [phone]

## 2022-04-14 NOTE — Telephone Encounter (Signed)
Requested Prescriptions  Pending Prescriptions Disp Refills  . fluticasone (FLONASE) 50 MCG/ACT nasal spray [Pharmacy Med Name: FLUTICASONE PROP 50 MCG SPRAY] 16 g 2    Sig: Place 1 spray into both nostrils daily.     Ear, Nose, and Throat: Nasal Preparations - Corticosteroids Passed - 04/13/2022 12:06 PM      Passed - Valid encounter within last 12 months    Recent Outpatient Visits          2 months ago Encounter for general adult medical examination with abnormal findings   [facility], Coralie Keens, NP   3 months ago COPD exacerbation [facility])   [facility], Coralie Keens, NP   9 months ago Mixed diabetic hyperlipidemia associated with type 2 diabetes mellitus [facility])   [facility], NP   1 year ago Uncontrolled type 2 diabetes mellitus with hyperglycemia [facility])   [facility] Olin Hauser, DO   1 year ago Encounter to establish care with new doctor   [facility], Lupita Raider, FNP      Future Appointments            In 4 weeks Baity, Coralie Keens, NP [facility] [place]   In 9 months Bjorn Loser, [facility]

## 2022-04-17 DIAGNOSIS — E1165 Type 2 diabetes mellitus with hyperglycemia: Secondary | ICD-10-CM | POA: Diagnosis not present

## 2022-04-20 DIAGNOSIS — R0600 Dyspnea, unspecified: Secondary | ICD-10-CM | POA: Diagnosis not present

## 2022-04-20 DIAGNOSIS — R2689 Other abnormalities of gait and mobility: Secondary | ICD-10-CM | POA: Diagnosis not present

## 2022-04-20 DIAGNOSIS — R296 Repeated falls: Secondary | ICD-10-CM | POA: Diagnosis not present

## 2022-04-20 DIAGNOSIS — M542 Cervicalgia: Secondary | ICD-10-CM | POA: Diagnosis not present

## 2022-04-20 DIAGNOSIS — R531 Weakness: Secondary | ICD-10-CM | POA: Diagnosis not present

## 2022-04-20 DIAGNOSIS — Z8673 Personal history of transient ischemic attack (TIA), and cerebral infarction without residual deficits: Secondary | ICD-10-CM | POA: Diagnosis not present

## 2022-04-20 DIAGNOSIS — F32A Depression, unspecified: Secondary | ICD-10-CM | POA: Diagnosis not present

## 2022-04-20 DIAGNOSIS — R45851 Suicidal ideations: Secondary | ICD-10-CM | POA: Diagnosis not present

## 2022-04-24 DIAGNOSIS — E119 Type 2 diabetes mellitus without complications: Secondary | ICD-10-CM | POA: Diagnosis not present

## 2022-04-26 DIAGNOSIS — F321 Major depressive disorder, single episode, moderate: Secondary | ICD-10-CM | POA: Diagnosis not present

## 2022-04-26 DIAGNOSIS — F419 Anxiety disorder, unspecified: Secondary | ICD-10-CM | POA: Diagnosis not present

## 2022-05-01 DIAGNOSIS — E785 Hyperlipidemia, unspecified: Secondary | ICD-10-CM | POA: Diagnosis not present

## 2022-05-01 DIAGNOSIS — Z794 Long term (current) use of insulin: Secondary | ICD-10-CM | POA: Diagnosis not present

## 2022-05-01 DIAGNOSIS — E1159 Type 2 diabetes mellitus with other circulatory complications: Secondary | ICD-10-CM

## 2022-05-01 DIAGNOSIS — I10 Essential (primary) hypertension: Secondary | ICD-10-CM

## 2022-05-01 DIAGNOSIS — F32A Depression, unspecified: Secondary | ICD-10-CM

## 2022-05-01 DIAGNOSIS — J449 Chronic obstructive pulmonary disease, unspecified: Secondary | ICD-10-CM

## 2022-05-08 ENCOUNTER — Other Ambulatory Visit: Payer: Self-pay | Admitting: Internal Medicine

## 2022-05-08 DIAGNOSIS — I10 Essential (primary) hypertension: Secondary | ICD-10-CM

## 2022-05-08 NOTE — Telephone Encounter (Signed)
Pt has OV scheduled for 05/18/22 Requested Prescriptions  Pending Prescriptions Disp Refills  . bisoprolol (ZEBETA) 10 MG tablet [Pharmacy Med Name: BISOPROLOL FUMARATE 10 MG TAB] 90 tablet 0    Sig: Take 1 tablet (10 mg total) by mouth daily. OFFICE VISIT NEEDED FOR ADDITIONAL REFILLS     Cardiovascular: Beta Blockers 2 Passed - 05/08/2022 11:27 AM      Passed - Cr in normal range and within 360 days    Creat  Date Value Ref Range Status  02/10/2022 0.94 0.50 - 1.05 mg/dL Final   Creatinine, Ser  Date Value Ref Range Status  03/02/2022 1.00 0.44 - 1.00 mg/dL Final   Creatinine, Urine  Date Value Ref Range Status  02/10/2022 43 20 - 275 mg/dL Final         Passed - Last BP in normal range    BP Readings from Last 1 Encounters:  03/02/22 134/81         Passed - Last Heart Rate in normal range    Pulse Readings from Last 1 Encounters:  03/02/22 96         Passed - Valid encounter within last 6 months    Recent Outpatient Visits          2 months ago Encounter for general adult medical examination with abnormal findings   [facility], Coralie Keens, NP   4 months ago COPD exacerbation [facility])   [facility], Coralie Keens, NP   10 months ago Mixed diabetic hyperlipidemia associated with type 2 diabetes mellitus [facility])   [facility], Coralie Keens, NP   1 year ago Uncontrolled type 2 diabetes mellitus with hyperglycemia [facility])   [facility] Olin Hauser, DO   1 year ago Encounter to establish care with new doctor   [facility], Lupita Raider, FNP      Future Appointments            In 1 week Baity, Coralie Keens, NP [facility] [place]   In 8 months [place], Nicki Reaper, [facility]

## 2022-05-11 DIAGNOSIS — F321 Major depressive disorder, single episode, moderate: Secondary | ICD-10-CM | POA: Diagnosis not present

## 2022-05-11 DIAGNOSIS — F419 Anxiety disorder, unspecified: Secondary | ICD-10-CM | POA: Diagnosis not present

## 2022-05-12 ENCOUNTER — Emergency Department: Payer: Medicare HMO

## 2022-05-12 ENCOUNTER — Inpatient Hospital Stay
Admission: EM | Admit: 2022-05-12 | Discharge: 2022-05-15 | DRG: 062 | Disposition: A | Discharge status: Home Health | Payer: Medicare HMO | Attending: Internal Medicine | Admitting: Internal Medicine

## 2022-05-12 ENCOUNTER — Encounter: Payer: Self-pay | Admitting: Pulmonary Disease

## 2022-05-12 DIAGNOSIS — I6389 Other cerebral infarction: Secondary | ICD-10-CM | POA: Diagnosis not present

## 2022-05-12 DIAGNOSIS — M351 Other overlap syndromes: Secondary | ICD-10-CM | POA: Diagnosis present

## 2022-05-12 DIAGNOSIS — E1165 Type 2 diabetes mellitus with hyperglycemia: Secondary | ICD-10-CM | POA: Diagnosis present

## 2022-05-12 DIAGNOSIS — J441 Chronic obstructive pulmonary disease with (acute) exacerbation: Secondary | ICD-10-CM | POA: Diagnosis not present

## 2022-05-12 DIAGNOSIS — H409 Unspecified glaucoma: Secondary | ICD-10-CM | POA: Diagnosis present

## 2022-05-12 DIAGNOSIS — Z833 Family history of diabetes mellitus: Secondary | ICD-10-CM | POA: Diagnosis not present

## 2022-05-12 DIAGNOSIS — Z79899 Other long term (current) drug therapy: Secondary | ICD-10-CM | POA: Diagnosis not present

## 2022-05-12 DIAGNOSIS — F329 Major depressive disorder, single episode, unspecified: Secondary | ICD-10-CM | POA: Diagnosis present

## 2022-05-12 DIAGNOSIS — F419 Anxiety disorder, unspecified: Secondary | ICD-10-CM | POA: Diagnosis present

## 2022-05-12 DIAGNOSIS — I10 Essential (primary) hypertension: Secondary | ICD-10-CM | POA: Diagnosis present

## 2022-05-12 DIAGNOSIS — I63512 Cerebral infarction due to unspecified occlusion or stenosis of left middle cerebral artery: Principal | ICD-10-CM | POA: Diagnosis present

## 2022-05-12 DIAGNOSIS — Z8249 Family history of ischemic heart disease and other diseases of the circulatory system: Secondary | ICD-10-CM | POA: Diagnosis not present

## 2022-05-12 DIAGNOSIS — Z7982 Long term (current) use of aspirin: Secondary | ICD-10-CM

## 2022-05-12 DIAGNOSIS — Z8673 Personal history of transient ischemic attack (TIA), and cerebral infarction without residual deficits: Secondary | ICD-10-CM | POA: Diagnosis present

## 2022-05-12 DIAGNOSIS — G8191 Hemiplegia, unspecified affecting right dominant side: Secondary | ICD-10-CM | POA: Diagnosis not present

## 2022-05-12 DIAGNOSIS — J432 Centrilobular emphysema: Secondary | ICD-10-CM | POA: Diagnosis not present

## 2022-05-12 DIAGNOSIS — Z888 Allergy status to other drugs, medicaments and biological substances status: Secondary | ICD-10-CM

## 2022-05-12 DIAGNOSIS — Z6836 Body mass index (BMI) 36.0-36.9, adult: Secondary | ICD-10-CM

## 2022-05-12 DIAGNOSIS — Z7984 Long term (current) use of oral hypoglycemic drugs: Secondary | ICD-10-CM

## 2022-05-12 DIAGNOSIS — E119 Type 2 diabetes mellitus without complications: Secondary | ICD-10-CM | POA: Diagnosis present

## 2022-05-12 DIAGNOSIS — Z7951 Long term (current) use of inhaled steroids: Secondary | ICD-10-CM

## 2022-05-12 DIAGNOSIS — K219 Gastro-esophageal reflux disease without esophagitis: Secondary | ICD-10-CM | POA: Diagnosis present

## 2022-05-12 DIAGNOSIS — G4733 Obstructive sleep apnea (adult) (pediatric): Secondary | ICD-10-CM | POA: Diagnosis present

## 2022-05-12 DIAGNOSIS — I672 Cerebral atherosclerosis: Secondary | ICD-10-CM | POA: Diagnosis not present

## 2022-05-12 DIAGNOSIS — Z7985 Long-term (current) use of injectable non-insulin antidiabetic drugs: Secondary | ICD-10-CM

## 2022-05-12 DIAGNOSIS — R531 Weakness: Secondary | ICD-10-CM | POA: Diagnosis not present

## 2022-05-12 DIAGNOSIS — K5909 Other constipation: Secondary | ICD-10-CM | POA: Diagnosis present

## 2022-05-12 DIAGNOSIS — I252 Old myocardial infarction: Secondary | ICD-10-CM

## 2022-05-12 DIAGNOSIS — R471 Dysarthria and anarthria: Secondary | ICD-10-CM | POA: Diagnosis present

## 2022-05-12 DIAGNOSIS — N179 Acute kidney failure, unspecified: Secondary | ICD-10-CM | POA: Diagnosis present

## 2022-05-12 DIAGNOSIS — R29710 NIHSS score 10: Secondary | ICD-10-CM | POA: Diagnosis present

## 2022-05-12 DIAGNOSIS — I639 Cerebral infarction, unspecified: Secondary | ICD-10-CM | POA: Diagnosis present

## 2022-05-12 DIAGNOSIS — J986 Disorders of diaphragm: Secondary | ICD-10-CM | POA: Diagnosis not present

## 2022-05-12 DIAGNOSIS — R29818 Other symptoms and signs involving the nervous system: Secondary | ICD-10-CM | POA: Diagnosis not present

## 2022-05-12 DIAGNOSIS — F17290 Nicotine dependence, other tobacco product, uncomplicated: Secondary | ICD-10-CM | POA: Diagnosis present

## 2022-05-12 DIAGNOSIS — M5416 Radiculopathy, lumbar region: Secondary | ICD-10-CM | POA: Diagnosis present

## 2022-05-12 DIAGNOSIS — N170 Acute kidney failure with tubular necrosis: Secondary | ICD-10-CM | POA: Diagnosis not present

## 2022-05-12 DIAGNOSIS — Z794 Long term (current) use of insulin: Secondary | ICD-10-CM

## 2022-05-12 DIAGNOSIS — J449 Chronic obstructive pulmonary disease, unspecified: Secondary | ICD-10-CM | POA: Diagnosis not present

## 2022-05-12 DIAGNOSIS — G8194 Hemiplegia, unspecified affecting left nondominant side: Secondary | ICD-10-CM | POA: Diagnosis present

## 2022-05-12 LAB — COMPREHENSIVE METABOLIC PANEL
ALT: 19 U/L (ref 0–44)
AST: 21 U/L (ref 15–41)
Albumin: 3.6 g/dL (ref 3.5–5.0)
Alkaline Phosphatase: 70 U/L (ref 38–126)
Anion gap: 6 (ref 5–15)
BUN: 17 mg/dL (ref 8–23)
CO2: 25 mmol/L (ref 22–32)
Calcium: 8.9 mg/dL (ref 8.9–10.3)
Chloride: 109 mmol/L (ref 98–111)
Creatinine, Ser: 0.95 mg/dL (ref 0.44–1.00)
GFR, Estimated: >60 mL/min (ref >60)
Glucose, Bld: 127 mg/dL — ABNORMAL HIGH (ref 70–99)
Potassium: 3.8 mmol/L (ref 3.5–5.1)
Sodium: 140 mmol/L (ref 135–145)
Total Bilirubin: 0.5 mg/dL (ref 0.3–1.2)
Total Protein: 6.9 g/dL (ref 6.5–8.1)

## 2022-05-12 LAB — CBC
HCT: 42.5 % (ref 36.0–46.0)
Hemoglobin: 12.5 g/dL (ref 12.0–15.0)
MCH: 24.9 pg — ABNORMAL LOW (ref 26.0–34.0)
MCHC: 29.4 g/dL — ABNORMAL LOW (ref 30.0–36.0)
MCV: 84.5 fL (ref 80.0–100.0)
Platelets: 208 10*3/uL (ref 150–400)
RBC: 5.03 MIL/uL (ref 3.87–5.11)
RDW: 16.1 % — ABNORMAL HIGH (ref 11.5–15.5)
WBC: 9.7 10*3/uL (ref 4.0–10.5)
nRBC: 0 % (ref 0.0–0.2)

## 2022-05-12 LAB — DIFFERENTIAL
Abs Immature Granulocytes: 0.05 10*3/uL (ref 0.00–0.07)
Basophils Absolute: 0 10*3/uL (ref 0.0–0.1)
Basophils Relative: 0 %
Eosinophils Absolute: 0.6 10*3/uL — ABNORMAL HIGH (ref 0.0–0.5)
Eosinophils Relative: 6 %
Immature Granulocytes: 1 %
Lymphocytes Relative: 31 %
Lymphs Abs: 3 10*3/uL (ref 0.7–4.0)
Monocytes Absolute: 0.7 10*3/uL (ref 0.1–1.0)
Monocytes Relative: 7 %
Neutro Abs: 5.4 10*3/uL (ref 1.7–7.7)
Neutrophils Relative %: 55 %
Smear Review: NORMAL

## 2022-05-12 LAB — PROTIME-INR
INR: 1 (ref 0.8–1.2)
Prothrombin Time: 13.3 seconds (ref 11.4–15.2)

## 2022-05-12 LAB — APTT: aPTT: 29 seconds (ref 24–36)

## 2022-05-12 LAB — I-STAT CREATININE, ED: Creatinine, Ser: 1 mg/dL (ref 0.44–1.00)

## 2022-05-12 MED ORDER — DOCUSATE SODIUM 100 MG PO CAPS
100.0000 mg | ORAL_CAPSULE | Freq: Two times a day (BID) | ORAL | Status: DC | PRN
  Administered 2022-05-14 – 2022-05-15 (×2): 100 mg via ORAL
  Filled 2022-05-12 (×2): qty 1

## 2022-05-12 MED ORDER — SODIUM CHLORIDE 0.9% FLUSH
3.0000 mL | Freq: Once | INTRAVENOUS | Status: AC
  Administered 2022-05-12: 3 mL via INTRAVENOUS

## 2022-05-12 MED ORDER — TENECTEPLASE FOR STROKE
0.2500 mg/kg | PACK | Freq: Once | INTRAVENOUS | Status: AC
Start: 2022-05-12 — End: 2022-05-12
  Administered 2022-05-12: 25 mg via INTRAVENOUS

## 2022-05-12 MED ORDER — LABETALOL HCL 5 MG/ML IV SOLN
10.0000 mg | INTRAVENOUS | Status: DC | PRN
Start: 2022-05-12 — End: 2022-05-15

## 2022-05-12 MED ORDER — PANTOPRAZOLE SODIUM 40 MG IV SOLR
40.0000 mg | Freq: Every day | INTRAVENOUS | Status: DC
  Administered 2022-05-12 – 2022-05-14 (×3): 40 mg via INTRAVENOUS
  Filled 2022-05-12 (×3): qty 10

## 2022-05-12 MED ORDER — POLYETHYLENE GLYCOL 3350 17 G PO PACK
17.0000 g | PACK | Freq: Every day | ORAL | Status: DC | PRN
  Administered 2022-05-14 – 2022-05-15 (×2): 17 g via ORAL
  Filled 2022-05-12 (×2): qty 1

## 2022-05-12 MED ORDER — IOHEXOL 350 MG/ML SOLN
75.0000 mL | Freq: Once | INTRAVENOUS | Status: AC | PRN
  Administered 2022-05-12: 75 mL via INTRAVENOUS

## 2022-05-12 NOTE — ED Notes (Signed)
Pt has some slight bleeding in mouth, unable to decipher whether or not it is related to tongue or tooth. Pt resting in bed, no needs verbalized at this time.

## 2022-05-12 NOTE — H&P (Signed)
CRITICAL CARE PROGRESS NOTE    Name: Rachel Nielsen MRN: [id number] DOB: 08-31-1951     LOS: 0   SUBJECTIVE FINDINGS & SIGNIFICANT EVENTS    Patient description:  This is 71 year old female with a history of centrilobular emphysema and COPD with OSA overlap syndrome, GERD, chronic constipation, TIA, essential hypertension, lumbar radiculopathy, major depressive disorder, morbid obesity who came in with altered mental status with reduced memory and mental sharpness.  She had findings consistent with acute CVA with CTA showing possible occlusion of left M3 MCA.  She was seen by neurology and is status post thrombolysis with TNK.  Medical ICU admission for post thrombolysis neuro monitoring  Lines/tubes :   Microbiology/Sepsis markers: Results for orders placed or performed in visit on 01/26/22  CULTURE, URINE COMPREHENSIVE     Status: None   Collection Time: 01/26/22 11:04 AM   Specimen: Urine   UR  Result Value Ref Range Status   Urine Culture, Comprehensive Final report  Final   Organism ID, Bacteria Comment  Final    Comment: Mixed urogenital flora 10,000-25,000 colony forming units per mL   Microscopic Examination     Status: None   Collection Time: 01/26/22 11:04 AM   Urine  Result Value Ref Range Status   WBC, UA 0-5 0 - 5 /hpf Final   RBC, Urine 0-2 0 - 2 /hpf Final   Epithelial Cells (non renal) 0-10 0 - 10 /hpf Final   Bacteria, UA None seen None seen/Few Final    Anti-infectives:  Anti-infectives (From admission, onward)    None        PAST MEDICAL HISTORY   Past Medical History:  Diagnosis Date   Allergy    Anxiety    Asthma    Colon polyps    Constipation    COPD (chronic obstructive pulmonary disease) (HCC)    Depression    GERD (gastroesophageal reflux disease)    Glaucoma     Heart attack ([place])    Hypertension    Sleep apnea    Sleep apnea    Stroke [facility])    Urinary incontinence      SURGICAL HISTORY   Past Surgical History:  Procedure Laterality Date   APPENDECTOMY     CHOLECYSTECTOMY     COLONOSCOPY WITH PROPOFOL N/A 07/20/2018   Procedure: COLONOSCOPY WITH PROPOFOL;  Surgeon: Toledo, Benay Pike, MD;  Location: [facility] ENDOSCOPY;  Service: Gastroenterology;  Laterality: N/A;   ESOPHAGOGASTRODUODENOSCOPY N/A 07/20/2018   Procedure: ESOPHAGOGASTRODUODENOSCOPY (EGD);  Surgeon: Toledo, Benay Pike, MD;  Location: [facility] ENDOSCOPY;  Service: Gastroenterology;  Laterality: N/A;   GASTRECTOMY     TUBAL LIGATION     VAGINAL HYSTERECTOMY       FAMILY HISTORY   Family History  Problem Relation Age of Onset   Hypertension Mother    Atrial fibrillation Mother    Heart attack Mother    Hypertension Father    Stomach cancer Father    Diabetes Father    Liver cancer Father    Diabetes Sister    Hypertension Sister      SOCIAL HISTORY   Social History   Tobacco Use   Smoking status: Former   Smokeless tobacco: Never   Tobacco comments:    4 months ago  Vaping Use   Vaping Use: Some days   Substances: Nicotine, Flavoring  Substance Use Topics   Alcohol use: Not Currently    Comment: monthly or less    Drug use: No  MEDICATIONS   Current Medication:  Current Facility-Administered Medications:    sodium chloride flush (NS) 0.9 % injection 3 mL, 3 mL, Intravenous, Once, Carrie Mew, MD  Current Outpatient Medications:    ACCU-CHEK GUIDE test strip, Use to check blood sugar up to twice a day as directed, Disp: 100 strip, Rfl: 3   Accu-Chek Softclix Lancets lancets, Use to check blood sugar up to twice a day as directed, Disp: 100 each, Rfl: 3   albuterol (PROVENTIL) (2.5 MG/3ML) 0.083% nebulizer solution, Inhale 3 mLs (2.5 mg total) into the lungs every 4 (four) hours as needed for wheezing or shortness of breath., Disp: 75 mL, Rfl:  12   albuterol (VENTOLIN HFA) 108 (90 Base) MCG/ACT inhaler, Inhale 2-4 puffs by mouth every 4 hours as needed for wheezing, cough, and/or shortness of breath, Disp: 6.7 g, Rfl: 0   ALPRAZolam (XANAX) 1 MG tablet, Take 1 mg by mouth 3 (three) times daily as needed., Disp: , Rfl:    amLODipine (NORVASC) 5 MG tablet, Take 1 tablet (5 mg total) by mouth daily., Disp: 90 tablet, Rfl: 1   asenapine (SAPHRIS) 5 MG SUBL 24 hr tablet, Place 5 mg under the tongue every evening., Disp: , Rfl:    aspirin 81 MG EC tablet, Take 1 tablet (81 mg total) by mouth daily. Swallow whole., Disp: 30 tablet, Rfl: 12   bisoprolol (ZEBETA) 10 MG tablet, Take 1 tablet (10 mg total) by mouth daily. OFFICE VISIT NEEDED FOR ADDITIONAL REFILLS, Disp: 90 tablet, Rfl: 0   buPROPion (WELLBUTRIN XL) 300 MG 24 hr tablet, Take 1 tablet (300 mg total) by mouth daily., Disp: 30 tablet, Rfl: 0   busPIRone (BUSPAR) 15 MG tablet, Take 15 mg by mouth 2 (two) times daily. , Disp: , Rfl:    docusate sodium (COLACE) 100 MG capsule, Take 100 mg by mouth daily., Disp: , Rfl:    Dulaglutide 3 MG/0.5ML SOPN, Inject 0.5 mLs (3 mg total) subcutaneously once a week, Disp: , Rfl:    FARXIGA 10 MG TABS tablet, Take 10 mg by mouth daily., Disp: , Rfl:    fluticasone (FLONASE) 50 MCG/ACT nasal spray, Place 1 spray into both nostrils daily., Disp: 16 g, Rfl: 2   fluticasone-salmeterol (ADVAIR) 250-50 MCG/ACT AEPB, Inhale 1 puff into the lungs 2 (two) times daily., Disp: , Rfl:    gabapentin (NEURONTIN) 600 MG tablet, Take 600-900 mg by mouth 3 (three) times daily. 600 mg AM, 600 mg pm and 900 mg at bedtime, Disp: , Rfl:    GLOBAL EASE INJECT PEN NEEDLES 32G X 4 MM MISC, Use as directed with insulin, Disp: 100 each, Rfl: 0   insulin aspart (NOVOLOG) 100 UNIT/ML FlexPen, Inject 20 units before the meals, three times daily. Do not take after meals., Disp: , Rfl:    Insulin Glargine Solostar (LANTUS) 100 UNIT/ML Solostar Pen, Inject 66 Units subcutaneously at  bedtime, Disp: , Rfl:    Insulin Pen Needle (BD PEN NEEDLE NANO U/F) 32G X 4 MM MISC, Inject 1 Device into the skin daily., Disp: 100 each, Rfl: 3   Insulin Syringe-Needle U-100 (INSULIN SYRINGE 1CC/31GX5/16") 31G X 5/16" 1 ML MISC, 1 Syringe by Does not apply route in the morning, at noon, in the evening, and at bedtime., Disp: 200 each, Rfl: 3   LINZESS 290 MCG CAPS capsule, Take 1 capsule (290 mcg total) by mouth daily before breakfast., Disp: 90 capsule, Rfl: 3   lurasidone (LATUDA) 20 MG TABS tablet, Take 1  tablet by mouth daily., Disp: , Rfl:    metFORMIN (GLUCOPHAGE) 1000 MG tablet, Take 1,000 mg by mouth 2 (two) times daily with a meal., Disp: , Rfl:    mirabegron ER (MYRBETRIQ) 50 MG TB24 tablet, Take 1 tablet (50 mg total) by mouth daily., Disp: 30 tablet, Rfl: 11   montelukast (SINGULAIR) 10 MG tablet, Take 1 tablet (10 mg total) by mouth daily., Disp: 30 tablet, Rfl: 0   Olopatadine HCl 0.2 % SOLN, Place 1 drop into both eyes daily., Disp: 2.5 mL, Rfl: 0   omeprazole (PRILOSEC) 40 MG capsule, Take 1 capsule (40 mg total) by mouth in the morning and at bedtime., Disp: 180 capsule, Rfl: 0   predniSONE (DELTASONE) 5 MG tablet, Take 1 tablet (5 mg total) by mouth once daily, Disp: , Rfl:    pregabalin (LYRICA) 50 MG capsule, Take 50 mg in the morning and 100 mg at night for a week then increase to 100 mg twice a day, Disp: , Rfl:    rosuvastatin (CRESTOR) 10 MG tablet, Take 1 tablet (10 mg total) by mouth daily., Disp: 90 tablet, Rfl: 0   TRAVATAN Z 0.004 % SOLN ophthalmic solution, Place 1 drop into both eyes at bedtime., Disp: 5 mL, Rfl: 0    ALLERGIES   Ace inhibitors    REVIEW OF SYSTEMS    10 point ROS conducted and is negative except as per subjective find  PHYSICAL EXAMINATION   Vital Signs: Temp:  [97.8 F (36.6 C)-98.3 F (36.8 C)] 98.3 F (36.8 C) (07/11 1530) Pulse Rate:  [80-86] 82 (07/11 1530) Resp:  [12-21] 12 (07/11 1530) BP: (135-153)/(70-86) 135/86 (07/11  1530) SpO2:  [95 %-99 %] 99 % (07/11 1530)  GENERAL: Age-appropriate no distress HEAD: Normocephalic, atraumatic.  EYES: Pupils equal, round, reactive to light.  No scleral icterus.  MOUTH: Moist mucosal membrane. NECK: Supple. No thyromegaly. No nodules. No JVD.  PULMONARY: Faythe Dingwall to auscultation CARDIOVASCULAR: S1 and S2. Regular rate and rhythm. No murmurs, rubs, or gallops.  GASTROINTESTINAL: Soft, nontender, non-distended. No masses. Positive bowel sounds. No hepatosplenomegaly.  MUSCULOSKELETAL: Right-sided weakness with 3 out of 4 in the right upper extremity and 3 out of 4 in the right lower extremity and 4 out of 4 in both left upper and lower extremities, symmetric smile NEUROLOGIC: GCS 14 SKIN:intact,warm,dry   PERTINENT DATA     Infusions:  Scheduled Medications:  sodium chloride flush  3 mL Intravenous Once   PRN Medications:  Hemodynamic parameters:   Intake/Output: No intake/output data recorded.  Ventilator  Settings:   LAB RESULTS:  Basic Metabolic Panel: Recent Labs  Lab 05/12/22 1357 05/12/22 1429  NA 140  --   K 3.8  --   CL 109  --   CO2 25  --   GLUCOSE 127*  --   BUN 17  --   CREATININE 0.95 1.00  CALCIUM 8.9  --    Liver Function Tests: Recent Labs  Lab 05/12/22 1357  AST 21  ALT 19  ALKPHOS 70  BILITOT 0.5  PROT 6.9  ALBUMIN 3.6   No results for input(s): "LIPASE", "AMYLASE" in the last 168 hours. No results for input(s): "AMMONIA" in the last 168 hours. CBC: Recent Labs  Lab 05/12/22 1357  WBC 9.7  NEUTROABS 5.4  HGB 12.5  HCT 42.5  MCV 84.5  PLT 208   Cardiac Enzymes: No results for input(s): "CKTOTAL", "CKMB", "CKMBINDEX", "TROPONINI" in the last 168 hours. BNP: Invalid input(s): "POCBNP"  CBG: No results for input(s): "GLUCAP" in the last 168 hours.     IMAGING RESULTS:  Imaging: CT ANGIO HEAD NECK W WO CM (CODE STROKE)  Result Date: 05/12/2022 CLINICAL DATA:  Neuro deficit, acute, stroke suspected EXAM:  CT ANGIOGRAPHY HEAD AND NECK TECHNIQUE: Multidetector CT imaging of the head and neck was performed using the standard protocol during bolus administration of intravenous contrast. Multiplanar CT image reconstructions and MIPs were obtained to evaluate the vascular anatomy. Carotid stenosis measurements (when applicable) are obtained utilizing NASCET criteria, using the distal internal carotid diameter as the denominator. RADIATION DOSE REDUCTION: This exam was performed according to the departmental dose-optimization program which includes automated exposure control, adjustment of the mA and/or kV according to patient size and/or use of iterative reconstruction technique. CONTRAST:  48m OMNIPAQUE IOHEXOL 350 MG/ML SOLN COMPARISON:  None Available. FINDINGS: CTA NECK FINDINGS Aortic arch: Great vessel origins are patent. Right carotid system: No evidence of dissection, stenosis (50% or greater) or occlusion. Left carotid system: No evidence of dissection, stenosis (50% or greater) or occlusion. Vertebral arteries: Left dominant. No evidence of dissection, stenosis (50% or greater) or occlusion. Skeleton: No acute findings.  Multilevel facet arthropathy. Other neck: No acute findings. Upper chest: Visualized lung apices are clear. Review of the MIP images confirms the above findings CTA HEAD FINDINGS Anterior circulation: Bilateral intracranial ICAs, M1 and M2 MCA branches are patent. Possible stenosis versus occlusion of a small left M3 MCA vessel, not well characterized due to small size (for example see series 9, images 135 through 39). Bilateral ACAs are patent. Posterior circulation: Bilateral intradural vertebral arteries basilar artery and PCAs are patent without proximal hemodynamically significant stenosis. Small P1 PCAs with bilateral posterior communicating arteries and small vertebrobasilar system, anatomic variant. Venous sinuses: As permitted by contrast timing, patent. Anatomic variants: Detailed  above Review of the MIP images confirms the above findings IMPRESSION: 1. Possible severe stenosis or occlusion of a small left M3 MCA branch not well characterized due to small size. 2. No proximal large vessel occlusion. Findings discussed with Dr. LJeani Hawkingvia telephone at 2:43 p.m. Electronically Signed   By: FMargaretha SheffieldM.D.   On: 05/12/2022 14:52   CT HEAD CODE STROKE WO CONTRAST  Result Date: 05/12/2022 CLINICAL DATA:  Code stroke. Neuro deficit, acute, stroke suspected. Last known normal 2 hours ago. Right-sided weakness and right facial droop. EXAM: CT HEAD WITHOUT CONTRAST TECHNIQUE: Contiguous axial images were obtained from the base of the skull through the vertex without intravenous contrast. RADIATION DOSE REDUCTION: This exam was performed according to the departmental dose-optimization program which includes automated exposure control, adjustment of the mA and/or kV according to patient size and/or use of iterative reconstruction technique. COMPARISON:  CT head without contrast 10/21/2020. MR head 12/26/2020 FINDINGS: Brain: No acute infarct, hemorrhage, or mass lesion is present. Basal ganglia are intact. Insular ribbon is normal. No acute or focal cortical abnormality is present. No significant white matter lesions are present. The brainstem and cerebellum are within normal limits. The ventricles are of normal size. No significant extraaxial fluid collection is present. Vascular: No hyperdense vessel or unexpected calcification. Skull: Calvarium is intact. No focal lytic or blastic lesions are present. No significant extracranial soft tissue lesion is present. Sinuses/Orbits: The paranasal sinuses and mastoid air cells are clear. The globes and orbits are within normal limits. ASPECTS ([facility] Early CT Score) - Ganglionic level infarction (caudate, lentiform nuclei, internal capsule, insula, M1-M3 cortex): 7/7 - Supraganglionic infarction (  M4-M6 cortex): 3/3 Total score (0-10  with 10 being normal): 10/10 IMPRESSION: Negative CT of the head. Electronically Signed   By: San Morelle M.D.   On: 05/12/2022 14:13   '@PROBHOSP'$ @ CT ANGIO HEAD NECK W WO CM (CODE STROKE)  Result Date: 05/12/2022 CLINICAL DATA:  Neuro deficit, acute, stroke suspected EXAM: CT ANGIOGRAPHY HEAD AND NECK TECHNIQUE: Multidetector CT imaging of the head and neck was performed using the standard protocol during bolus administration of intravenous contrast. Multiplanar CT image reconstructions and MIPs were obtained to evaluate the vascular anatomy. Carotid stenosis measurements (when applicable) are obtained utilizing NASCET criteria, using the distal internal carotid diameter as the denominator. RADIATION DOSE REDUCTION: This exam was performed according to the departmental dose-optimization program which includes automated exposure control, adjustment of the mA and/or kV according to patient size and/or use of iterative reconstruction technique. CONTRAST:  22m OMNIPAQUE IOHEXOL 350 MG/ML SOLN COMPARISON:  None Available. FINDINGS: CTA NECK FINDINGS Aortic arch: Great vessel origins are patent. Right carotid system: No evidence of dissection, stenosis (50% or greater) or occlusion. Left carotid system: No evidence of dissection, stenosis (50% or greater) or occlusion. Vertebral arteries: Left dominant. No evidence of dissection, stenosis (50% or greater) or occlusion. Skeleton: No acute findings.  Multilevel facet arthropathy. Other neck: No acute findings. Upper chest: Visualized lung apices are clear. Review of the MIP images confirms the above findings CTA HEAD FINDINGS Anterior circulation: Bilateral intracranial ICAs, M1 and M2 MCA branches are patent. Possible stenosis versus occlusion of a small left M3 MCA vessel, not well characterized due to small size (for example see series 9, images 135 through 39). Bilateral ACAs are patent. Posterior circulation: Bilateral intradural vertebral arteries  basilar artery and PCAs are patent without proximal hemodynamically significant stenosis. Small P1 PCAs with bilateral posterior communicating arteries and small vertebrobasilar system, anatomic variant. Venous sinuses: As permitted by contrast timing, patent. Anatomic variants: Detailed above Review of the MIP images confirms the above findings IMPRESSION: 1. Possible severe stenosis or occlusion of a small left M3 MCA branch not well characterized due to small size. 2. No proximal large vessel occlusion. Findings discussed with Dr. LJeani Hawkingvia telephone at 2:43 p.m. Electronically Signed   By: FMargaretha SheffieldM.D.   On: 05/12/2022 14:52   CT HEAD CODE STROKE WO CONTRAST  Result Date: 05/12/2022 CLINICAL DATA:  Code stroke. Neuro deficit, acute, stroke suspected. Last known normal 2 hours ago. Right-sided weakness and right facial droop. EXAM: CT HEAD WITHOUT CONTRAST TECHNIQUE: Contiguous axial images were obtained from the base of the skull through the vertex without intravenous contrast. RADIATION DOSE REDUCTION: This exam was performed according to the departmental dose-optimization program which includes automated exposure control, adjustment of the mA and/or kV according to patient size and/or use of iterative reconstruction technique. COMPARISON:  CT head without contrast 10/21/2020. MR head 12/26/2020 FINDINGS: Brain: No acute infarct, hemorrhage, or mass lesion is present. Basal ganglia are intact. Insular ribbon is normal. No acute or focal cortical abnormality is present. No significant white matter lesions are present. The brainstem and cerebellum are within normal limits. The ventricles are of normal size. No significant extraaxial fluid collection is present. Vascular: No hyperdense vessel or unexpected calcification. Skull: Calvarium is intact. No focal lytic or blastic lesions are present. No significant extracranial soft tissue lesion is present. Sinuses/Orbits: The paranasal sinuses and mastoid  air cells are clear. The globes and orbits are within normal limits. ASPECTS ([facility] Score) -  Ganglionic level infarction (caudate, lentiform nuclei, internal capsule, insula, M1-M3 cortex): 7/7 - Supraganglionic infarction (M4-M6 cortex): 3/3 Total score (0-10 with 10 being normal): 10/10 IMPRESSION: Negative CT of the head. Electronically Signed   By: San Morelle M.D.   On: 05/12/2022 14:13     ASSESSMENT AND PLAN    -Multidisciplinary rounds held today  Acute left MCA stroke -Neurology on case-appreciate input -That is post thrombolysis with TNKase -Postinfusion neuro monitoring Every hour neurochecks -P.o. statin post SLP -Antiplatelet agents per neurology  COPD with OSA overlap syndrome -Continue CPAP nightly with settings per RT  Renal Failure-most likely due to ATN -follow chem 7 -follow UO -continue Foley Catheter-assess need daily  Major depressive disorder - continue home antidepressant regimen   GI/Nutrition GI PROPHYLAXIS as indicated DIET-->TF's as tolerated Constipation protocol as indicated  ENDO - ICU hypoglycemic\Hyperglycemia protocol -check FSBS per protocol   ELECTROLYTES -follow labs as needed -replace as needed -pharmacy consultation   DVT/GI PRX ordered -SCDs  TRANSFUSIONS AS NEEDED MONITOR FSBS ASSESS the need for LABS as needed    Critical care provider statement:   Total critical care time: 68 minutes   Performed by: Lanney Gins MD   Critical care time was exclusive of separately billable procedures and treating other patients.   Critical care was necessary to treat or prevent imminent or life-threatening deterioration.   Critical care was time spent personally by me on the following activities: development of treatment plan with patient and/or surrogate as well as nursing, discussions with consultants, evaluation of patient's response to treatment, examination of patient, obtaining history from  patient or surrogate, ordering and performing treatments and interventions, ordering and review of laboratory studies, ordering and review of radiographic studies, pulse oximetry and re-evaluation of patient's condition.    Ottie Glazier, M.D.  Pulmonary & Critical Care Medicine

## 2022-05-12 NOTE — Consult Note (Addendum)
NEURO HOSPITALIST CONSULT NOTE   Requesting physician: Dr. Archie Balboa  Reason for Consult: Acute onset of right sided weakness and dysarthria.   History obtained from:  Patient and Chart     HPI:                                                                                                                                          RASHI GRANIER is an 71 y.o. female with a PMHx of anxiety, COPD, MI, HTN, glaucoma, sleep apnea and 3 prior strokes who presents as a Code Stroke via EMS after she noted right sided weakness. She was at home alone when at noon she noticed sudden onset of right arm and leg weakness. She waited for her son to come home and at that time tried to ambulate to the bathroom, noticing that her gait was more unsteady than usual. Son then called EMS. She also notes worsened dysarthria.   She states that she has a history of "two to three" strokes in the past, the last one occurring 2 years ago. She has residual deficits of left sided weakness and uses a walker or a cane to ambulate.   Home medications include ASA and rosuvastatin.   mRS: 1  Past Medical History:  Diagnosis Date   Allergy    Anxiety    Asthma    Colon polyps    Constipation    COPD (chronic obstructive pulmonary disease) (HCC)    Depression    GERD (gastroesophageal reflux disease)    Glaucoma    Heart attack ([place])    Hypertension    Sleep apnea    Sleep apnea    Stroke [facility])    Urinary incontinence     Past Surgical History:  Procedure Laterality Date   APPENDECTOMY     CHOLECYSTECTOMY     COLONOSCOPY WITH PROPOFOL N/A 07/20/2018   Procedure: COLONOSCOPY WITH PROPOFOL;  Surgeon: Toledo, Benay Pike, MD;  Location: [facility] ENDOSCOPY;  Service: Gastroenterology;  Laterality: N/A;   ESOPHAGOGASTRODUODENOSCOPY N/A 07/20/2018   Procedure: ESOPHAGOGASTRODUODENOSCOPY (EGD);  Surgeon: Toledo, Benay Pike, MD;  Location: [facility] ENDOSCOPY;  Service: Gastroenterology;  Laterality: N/A;    GASTRECTOMY     TUBAL LIGATION     VAGINAL HYSTERECTOMY      Family History  Problem Relation Age of Onset   Hypertension Mother    Atrial fibrillation Mother    Heart attack Mother    Hypertension Father    Stomach cancer Father    Diabetes Father    Liver cancer Father    Diabetes Sister    Hypertension Sister              Social History:  reports that she has quit smoking. She has never used smokeless tobacco. She  reports that she does not currently use alcohol. She reports that she does not use drugs.  Allergies  Allergen Reactions   Ace Inhibitors Cough    HOME MEDICATIONS:                                                                                                                      No current facility-administered medications on file prior to encounter.   Current Outpatient Medications on File Prior to Encounter  Medication Sig Dispense Refill   ACCU-CHEK GUIDE test strip Use to check blood sugar up to twice a day as directed 100 strip 3   Accu-Chek Softclix Lancets lancets Use to check blood sugar up to twice a day as directed 100 each 3   albuterol (PROVENTIL) (2.5 MG/3ML) 0.083% nebulizer solution Inhale 3 mLs (2.5 mg total) into the lungs every 4 (four) hours as needed for wheezing or shortness of breath. 75 mL 12   albuterol (VENTOLIN HFA) 108 (90 Base) MCG/ACT inhaler Inhale 2-4 puffs by mouth every 4 hours as needed for wheezing, cough, and/or shortness of breath 6.7 g 0   ALPRAZolam (XANAX) 1 MG tablet Take 1 mg by mouth 3 (three) times daily as needed.     amLODipine (NORVASC) 5 MG tablet Take 1 tablet (5 mg total) by mouth daily. 90 tablet 1   asenapine (SAPHRIS) 5 MG SUBL 24 hr tablet Place 5 mg under the tongue every evening.     aspirin 81 MG EC tablet Take 1 tablet (81 mg total) by mouth daily. Swallow whole. 30 tablet 12   bisoprolol (ZEBETA) 10 MG tablet Take 1 tablet (10 mg total) by mouth daily. OFFICE VISIT NEEDED FOR ADDITIONAL REFILLS 90 tablet  0   buPROPion (WELLBUTRIN XL) 300 MG 24 hr tablet Take 1 tablet (300 mg total) by mouth daily. 30 tablet 0   busPIRone (BUSPAR) 15 MG tablet Take 15 mg by mouth 2 (two) times daily.      docusate sodium (COLACE) 100 MG capsule Take 100 mg by mouth daily.     Dulaglutide 3 MG/0.5ML SOPN Inject 0.5 mLs (3 mg total) subcutaneously once a week     FARXIGA 10 MG TABS tablet Take 10 mg by mouth daily.     fluticasone (FLONASE) 50 MCG/ACT nasal spray Place 1 spray into both nostrils daily. 16 g 2   fluticasone-salmeterol (ADVAIR) 250-50 MCG/ACT AEPB Inhale 1 puff into the lungs 2 (two) times daily.     gabapentin (NEURONTIN) 600 MG tablet Take 600-900 mg by mouth 3 (three) times daily. 600 mg AM, 600 mg pm and 900 mg at bedtime     GLOBAL EASE INJECT PEN NEEDLES 32G X 4 MM MISC Use as directed with insulin 100 each 0   insulin aspart (NOVOLOG) 100 UNIT/ML FlexPen Inject 20 units before the meals, three times daily. Do not take after meals.     Insulin Glargine Solostar (LANTUS) 100 UNIT/ML Solostar Pen Inject 66 Units subcutaneously at bedtime  Insulin Pen Needle (BD PEN NEEDLE NANO U/F) 32G X 4 MM MISC Inject 1 Device into the skin daily. 100 each 3   Insulin Syringe-Needle U-100 (INSULIN SYRINGE 1CC/31GX5/16") 31G X 5/16" 1 ML MISC 1 Syringe by Does not apply route in the morning, at noon, in the evening, and at bedtime. 200 each 3   LINZESS 290 MCG CAPS capsule Take 1 capsule (290 mcg total) by mouth daily before breakfast. 90 capsule 3   lurasidone (LATUDA) 20 MG TABS tablet Take 1 tablet by mouth daily.     metFORMIN (GLUCOPHAGE) 1000 MG tablet Take 1,000 mg by mouth 2 (two) times daily with a meal.     mirabegron ER (MYRBETRIQ) 50 MG TB24 tablet Take 1 tablet (50 mg total) by mouth daily. 30 tablet 11   montelukast (SINGULAIR) 10 MG tablet Take 1 tablet (10 mg total) by mouth daily. 30 tablet 0   Olopatadine HCl 0.2 % SOLN Place 1 drop into both eyes daily. 2.5 mL 0   omeprazole (PRILOSEC) 40  MG capsule Take 1 capsule (40 mg total) by mouth in the morning and at bedtime. 180 capsule 0   predniSONE (DELTASONE) 5 MG tablet Take 1 tablet (5 mg total) by mouth once daily     pregabalin (LYRICA) 50 MG capsule Take 50 mg in the morning and 100 mg at night for a week then increase to 100 mg twice a day     rosuvastatin (CRESTOR) 10 MG tablet Take 1 tablet (10 mg total) by mouth daily. 90 tablet 0   TRAVATAN Z 0.004 % SOLN ophthalmic solution Place 1 drop into both eyes at bedtime. 5 mL 0     ROS:                                                                                                                                       Does not endorse any other acute complaints. Detailed ROS deferred in the context of acuity of presentation.    Today's Vitals   05/12/22 1445  BP: 136/77  Pulse: 83  Resp: 15  SpO2: 95%   There is no height or weight on file to calculate BMI.   General Examination:                                                                                                       Physical Exam  HEENT-  [place]/AT    Lungs- Respirations unlabored Extremities- No edema  Neurological  Examination Mental Status: Awake and alert. Speech is fluent but dysarthric. Comprehension intact. Able to follow all commands and answer all questions. Oriented x 5. Naming intact. Repetition intact. Mildly increased latencies of verbal responses.  Cranial Nerves: II: Temporal visual fields intact with no extinction to DSS. PERRL.   III,IV, VI: No ptosis. Eyes are conjugate. Slight left gaze preference. Able to track to left and right of midline but has difficulty with gazing fully to the right. No nystagmus.  V: Decreased temp sensation on the right.  VII: Mild right facial droop.  VIII: Hearing intact to voice. IX,X: No hypophonia or hoarseness XI: Decreased shoulder shrug on the right XII: Midline tongue extension Motor: RUE 0/5 with flaccid tone RLE 2/5 with decreased tone LUE and  LLE 5/5 Sensory: Decreased FT and temp sensation to RUE and RLE. Left sided sensation intact.  Deep Tendon Reflexes: 2+ and symmetric throughout Plantars: Right: downgoing   Left: downgoing Cerebellar: Unable to perform on the right. No ataxia with left FNF, but action tremor noted. Left H-S without ataxia.  Gait: Unable to assess   Lab Results: Basic Metabolic Panel: No results for input(s): "NA", "K", "CL", "CO2", "GLUCOSE", "BUN", "CREATININE", "CALCIUM", "MG", "PHOS" in the last 168 hours.  CBC: Recent Labs  Lab 05/12/22 1357  WBC PENDING  HGB 12.5  HCT 42.5  MCV 84.5  PLT 208    Cardiac Enzymes: No results for input(s): "CKTOTAL", "CKMB", "CKMBINDEX", "TROPONINI" in the last 168 hours.  Lipid Panel: No results for input(s): "CHOL", "TRIG", "HDL", "CHOLHDL", "VLDL", "LDLCALC" in the last 168 hours.  Imaging: CT HEAD CODE STROKE WO CONTRAST  Result Date: 05/12/2022 CLINICAL DATA:  Code stroke. Neuro deficit, acute, stroke suspected. Last known normal 2 hours ago. Right-sided weakness and right facial droop. EXAM: CT HEAD WITHOUT CONTRAST TECHNIQUE: Contiguous axial images were obtained from the base of the skull through the vertex without intravenous contrast. RADIATION DOSE REDUCTION: This exam was performed according to the departmental dose-optimization program which includes automated exposure control, adjustment of the mA and/or kV according to patient size and/or use of iterative reconstruction technique. COMPARISON:  CT head without contrast 10/21/2020. MR head 12/26/2020 FINDINGS: Brain: No acute infarct, hemorrhage, or mass lesion is present. Basal ganglia are intact. Insular ribbon is normal. No acute or focal cortical abnormality is present. No significant white matter lesions are present. The brainstem and cerebellum are within normal limits. The ventricles are of normal size. No significant extraaxial fluid collection is present. Vascular: No hyperdense vessel or  unexpected calcification. Skull: Calvarium is intact. No focal lytic or blastic lesions are present. No significant extracranial soft tissue lesion is present. Sinuses/Orbits: The paranasal sinuses and mastoid air cells are clear. The globes and orbits are within normal limits. ASPECTS [facility] Stroke Program Early CT Score) - Ganglionic level infarction (caudate, lentiform nuclei, internal capsule, insula, M1-M3 cortex): 7/7 - Supraganglionic infarction (M4-M6 cortex): 3/3 Total score (0-10 with 10 being normal): 10/10 IMPRESSION: Negative CT of the head. Electronically Signed   By: San Morelle M.D.   On: 05/12/2022 14:13     Assessment: 71 y.o. female with a PMHx of anxiety, COPD, MI, HTN, glaucoma, sleep apnea and 3 prior strokes with residual left sided weakness, uses a walker/cane to ambulate, who presents as a Code Stroke via EMS after she noted right sided weakness. She was at home alone when at noon she noticed sudden onset of right arm and leg weakness. She waited for her son to  come home and at that time tried to ambulate to the bathroom, noticing that her gait was more unsteady than usual. Son then called EMS. She also notes worsened dysarthria.  - Exam reveals deficits localizable to the left MCA territory. Overall presentation is most consistent with an acute stroke. NIHSS 10. - CT head: No acute infarct, hemorrhage, or mass lesion is present. Basal ganglia are intact. Insular ribbon is normal. No acute or focal cortical abnormality is present. No significant white matter lesions are present. The brainstem and cerebellum are within normal limits. - CTA of head and neck: Possible severe stenosis or occlusion of a small left M3 MCA branch not well characterized due to small size. No proximal large vessel occlusion. - After comprehensive review of possible contraindications, she has no absolute contraindications to TNK administration. - Patient is an IV thrombolysis candidate. Discussed  extensively the risks/benefits of IV thrombolysis treatment vs. no treatment with the patient, including risks of hemorrhage and death with IV thrombolysis administration versus worse overall outcomes on average in patients within thrombolysis time window who are not administered TNK. Overall benefits of TNK regarding long-term prognosis are felt to outweigh risks. The patient expressed understanding and wish to proceed with TNK.   - The left M3 branch is too distal to be safely accessed via catheter. She is not a candidate for mechanical thrombectomy.  - Home medications include ASA and rosuvastatin. She is classifiable as having failed ASA monotherapy.    Recommendations: 1. Being admitted to the ICU under CCM.  2. Post-TNK order set to include frequent neuro checks and BP management.  3. No antiplatelet medications or anticoagulants for at least 24 hours following TNK.  4. DVT prophylaxis with SCDs.  5. Continue statin.  6. Will need escalation of antiplatelet therapy with the addition of Plavix if follow up CT at 24 hours is negative for hemorrhagic conversion. No ASA or Plavix for 24 hours and can only consider starting if repeat CT head is negative for hemorrhage.  7. Cardiac telemetry 8. TTE.  9. MRI brain 10. PT/OT/Speech.  11. NPO until passes swallow evaluation.  12. Fasting lipid panel, HgbA1c  60 minutes spent in the emergent neurological evaluation and management of this critically ill patient.   Electronically signed: Dr. Kerney Elbe 05/12/2022, 2:25 PM

## 2022-05-12 NOTE — ED Notes (Signed)
Istat creat 1.0. Neurologist verbal to move forward with Perfusion CT scan. Pt neuro status unchanged at this time

## 2022-05-12 NOTE — ED Notes (Signed)
Pt provides verbal consent for treatment with TNK at this time. MD Lindzen at bedside for consent

## 2022-05-12 NOTE — Progress Notes (Signed)
   05/12/22 1445  Clinical Encounter Type  Visited With Patient  Visit Type Initial;Code  Spiritual Encounters  Spiritual Needs Prayer   Chaplain Brison Fiumara attended to pt once she returned from [place]. Chaplain B offered compassionate, non-anxious presence and normalization of emotions.  Chaplain B offered prayer at pt request. Offered to f/u later to check on well-being and offer ongoing support.

## 2022-05-12 NOTE — Consult Note (Signed)
PHARMACY CONSULT NOTE - FOLLOW UP  Pharmacy Consult for Electrolyte Monitoring and Replacement   Recent Labs: Potassium (mmol/L)  Date Value  05/12/2022 3.8   Magnesium (mg/dL)  Date Value  12/21/2021 2.0   Calcium (mg/dL)  Date Value  05/12/2022 8.9   Albumin (g/dL)  Date Value  05/12/2022 3.6  10/19/2019 4.1   Sodium (mmol/L)  Date Value  05/12/2022 140  10/19/2019 141     Assessment: 71 yo female with h/o centrilobular emphysema and COPD with OSA overlap syndrome, GERD, chronic constipation, TIA, HTN, lumbar radiculopathy, MDD, morbid obesity who came in with altered mental status with reduced memory and mental sharpness. W/u c/w CVA & CTA showing possible occlusion of left M3 MCA, resulting in TNK administration and admit for ICU monitoring post-thrombolysis. Pharmacy c/s'd for electrolyte mgmt.  Goal of Therapy:  Lytes WNL  Plan:  Lytes are currently WNL and Scr is stable 0.9-1. Will CTM and replace PRN. Next BMP with AM labs  Lorna Dibble ,PharmD Clinical Pharmacist 05/12/2022 4:03 PM

## 2022-05-12 NOTE — ED Notes (Signed)
Pt resting in bed at this time. [place] within reach, no needs verbalized at this time

## 2022-05-12 NOTE — ED Triage Notes (Signed)
Pt arrives today with code stroke. LKW 12pm. Pt arrives with R sided weakness. Pt to CT at 1355.

## 2022-05-12 NOTE — Progress Notes (Signed)
Prayer emotional suppoert

## 2022-05-12 NOTE — ED Notes (Signed)
Pt resting in bed talking on the phone with her family at this time.

## 2022-05-12 NOTE — ED Provider Notes (Signed)
[facility] Provider Note    Event Date/Time   First MD Initiated Contact with Patient 05/12/22 1555     (approximate)   History   Chief Complaint: Code Stroke   HPI  Rachel Nielsen is a 71 y.o. female with a past history of diabetes, hypertension, prior strokes who comes the ED complaining of right-sided weakness that started at 12:00 PM today.  Constant.  No trauma, no fevers.  No chest pain or shortness of breath.      Physical Exam   Triage Vital Signs: ED Triage Vitals  Enc Vitals Group     BP 05/12/22 1445 136/77     Pulse Rate 05/12/22 1445 83     Resp 05/12/22 1445 15     Temp 05/12/22 1451 97.8 F (36.6 C)     Temp Source 05/12/22 1451 Oral     SpO2 05/12/22 1445 95 %     Weight --      Height --      Head Circumference --      Peak Flow --      Pain Score --      Pain Loc --      Pain Edu? --      Excl. in [place]? --     Most recent vital signs: Vitals:   05/12/22 1700 05/12/22 1714  BP: 129/79 139/79  Pulse: 80 82  Resp: (!) 40 (!) 22  Temp:  98 F (36.7 C)  SpO2: 94% 98%    General: Awake, no distress.  CV:  Good peripheral perfusion.  Regular rate and rhythm Resp:  Normal effort.  Clear to auscultation bilaterally Abd:  No distention.  Soft nontender Other:  Right upper extremity and right lower extremity weakness.  Altered sensation.  Dysarthria.  NIH stroke scale of 9   ED Results / Procedures / Treatments   Labs (all labs ordered are listed, but only abnormal results are displayed) Labs Reviewed  CBC - Abnormal; Notable for the following components:      Result Value   MCH 24.9 (*)    MCHC 29.4 (*)    RDW 16.1 (*)    All other components within normal limits  DIFFERENTIAL - Abnormal; Notable for the following components:   Eosinophils Absolute 0.6 (*)    All other components within normal limits  COMPREHENSIVE METABOLIC PANEL - Abnormal; Notable for the following components:   Glucose, Bld 127 (*)    All  other components within normal limits  PROTIME-INR  APTT  ETHANOL  CREATININE, SERUM  URINALYSIS, ROUTINE W REFLEX MICROSCOPIC  CBC  BASIC METABOLIC PANEL  MAGNESIUM  PHOSPHORUS  I-STAT CREATININE, ED  CBG MONITORING, ED     EKG Interpreted by me Normal sinus rhythm rate of 83.  Normal axis intervals QRS ST segments and T waves.   RADIOLOGY CT head interpreted by me, negative for mass or intracranial hemorrhage.  Radiology report reviewed.  CT angiogram head and neck radiology report reviewed.  No LVO.   PROCEDURES:  .Critical Care  Performed by: Carrie Mew, MD Authorized by: Carrie Mew, MD   Critical care provider statement:    Critical care time (minutes):  35   Critical care time was exclusive of:  Separately billable procedures and treating other patients   Critical care was necessary to treat or prevent imminent or life-threatening deterioration of the following conditions:  CNS failure or compromise and circulatory failure   Critical care was time  spent personally by me on the following activities:  Development of treatment plan with patient or surrogate, discussions with consultants, evaluation of patient's response to treatment, examination of patient, obtaining history from patient or surrogate, ordering and performing treatments and interventions, ordering and review of laboratory studies, ordering and review of radiographic studies, pulse oximetry, re-evaluation of patient's condition and review of old charts   Care discussed with: admitting provider   Comments:        .1-3 Lead EKG Interpretation  Performed by: Carrie Mew, MD Authorized by: Carrie Mew, MD     Interpretation: normal     ECG rate:  80   ECG rate assessment: normal     Rhythm: sinus rhythm     Ectopy: none     Conduction: normal       MEDICATIONS ORDERED IN ED: Medications  docusate sodium (COLACE) capsule 100 mg (has no administration in time range)   polyethylene glycol (MIRALAX / GLYCOLAX) packet 17 g (has no administration in time range)  pantoprazole (PROTONIX) injection 40 mg (40 mg Intravenous Given 05/12/22 1713)  sodium chloride flush (NS) 0.9 % injection 3 mL (3 mLs Intravenous Given 05/12/22 1713)  iohexol (OMNIPAQUE) 350 MG/ML injection 75 mL (75 mLs Intravenous Contrast Given 05/12/22 1438)  tenecteplase (TNKASE) injection for Stroke 25 mg (25 mg Intravenous Given 05/12/22 1442)     IMPRESSION / MDM / [place] / ED COURSE  I reviewed the triage vital signs and the nursing notes.                              Differential diagnosis includes, but is not limited to, acute ischemic stroke, intracranial hemorrhage, intracranial mass, electrolyte abnormality  Patient's presentation is most consistent with acute presentation with potential threat to life or bodily function.  Patient presents with acute right-sided neurologic deficits.  Highly suspicious for ischemic stroke.  Not on blood thinners, initial CT head negative.  Seen by neurology who recommends TNK which was given.  CTA obtained which is negative for LVO, patient's not a candidate for endovascular intervention.  Case discussed with ICU team for admission.   Clinical Course as of 05/12/22 1723  Tue May 12, 2022  1438 INR: 1.0 [FC]  29 Protime-INR [FC]    Clinical Course User Index [FC] Coyer, Alen Blew     FINAL CLINICAL IMPRESSION(S) / ED DIAGNOSES   Final diagnoses:  Acute ischemic stroke [facility])     Rx / DC Orders   ED Discharge Orders     None        Note:  This document was prepared using Dragon voice recognition software and may include unintentional dictation errors.   Carrie Mew, MD 05/12/22 1726

## 2022-05-12 NOTE — ED Notes (Signed)
PT given apple juice

## 2022-05-13 ENCOUNTER — Ambulatory Visit: Payer: Medicare HMO | Admitting: Physician Assistant

## 2022-05-13 ENCOUNTER — Inpatient Hospital Stay: Payer: Medicare HMO

## 2022-05-13 ENCOUNTER — Inpatient Hospital Stay (HOSPITAL_COMMUNITY)
Admit: 2022-05-13 | Discharge: 2022-05-13 | Disposition: A | Discharge status: Home / Self Care | Payer: Medicare HMO | Attending: Pulmonary Disease | Admitting: Pulmonary Disease

## 2022-05-13 DIAGNOSIS — I639 Cerebral infarction, unspecified: Secondary | ICD-10-CM | POA: Diagnosis not present

## 2022-05-13 DIAGNOSIS — I6389 Other cerebral infarction: Secondary | ICD-10-CM

## 2022-05-13 LAB — BASIC METABOLIC PANEL
Anion gap: 7 (ref 5–15)
BUN: 14 mg/dL (ref 8–23)
CO2: 26 mmol/L (ref 22–32)
Calcium: 8.7 mg/dL — ABNORMAL LOW (ref 8.9–10.3)
Chloride: 107 mmol/L (ref 98–111)
Creatinine, Ser: 1.01 mg/dL — ABNORMAL HIGH (ref 0.44–1.00)
GFR, Estimated: >60 mL/min (ref >60)
Glucose, Bld: 222 mg/dL — ABNORMAL HIGH (ref 70–99)
Potassium: 4.2 mmol/L (ref 3.5–5.1)
Sodium: 140 mmol/L (ref 135–145)

## 2022-05-13 LAB — URINALYSIS, ROUTINE W REFLEX MICROSCOPIC
Bilirubin Urine: NEGATIVE
Glucose, UA: NEGATIVE mg/dL
Ketones, ur: NEGATIVE mg/dL
Nitrite: POSITIVE — AB
Protein, ur: NEGATIVE mg/dL
Specific Gravity, Urine: 1.029 (ref 1.005–1.030)
pH: 5 (ref 5.0–8.0)

## 2022-05-13 LAB — MAGNESIUM: Magnesium: 2 mg/dL (ref 1.7–2.4)

## 2022-05-13 LAB — ECHOCARDIOGRAM COMPLETE: S' Lateral: 2.4 cm

## 2022-05-13 LAB — CREATININE, SERUM
Creatinine, Ser: 1.1 mg/dL — ABNORMAL HIGH (ref 0.44–1.00)
GFR, Estimated: 54 mL/min — ABNORMAL LOW (ref >60)

## 2022-05-13 LAB — CBC
HCT: 41.7 % (ref 36.0–46.0)
Hemoglobin: 12.2 g/dL (ref 12.0–15.0)
MCH: 24.7 pg — ABNORMAL LOW (ref 26.0–34.0)
MCHC: 29.3 g/dL — ABNORMAL LOW (ref 30.0–36.0)
MCV: 84.4 fL (ref 80.0–100.0)
Platelets: 186 10*3/uL (ref 150–400)
RBC: 4.94 MIL/uL (ref 3.87–5.11)
RDW: 16 % — ABNORMAL HIGH (ref 11.5–15.5)
WBC: 8.6 10*3/uL (ref 4.0–10.5)
nRBC: 0 % (ref 0.0–0.2)

## 2022-05-13 LAB — GLUCOSE, CAPILLARY
Glucose-Capillary: 162 mg/dL — ABNORMAL HIGH (ref 70–99)
Glucose-Capillary: 232 mg/dL — ABNORMAL HIGH (ref 70–99)

## 2022-05-13 LAB — MRSA NEXT GEN BY PCR, NASAL
MRSA by PCR Next Gen: NOT DETECTED
MRSA by PCR Next Gen: NOT DETECTED

## 2022-05-13 LAB — PHOSPHORUS: Phosphorus: 3.3 mg/dL (ref 2.5–4.6)

## 2022-05-13 LAB — ETHANOL: Alcohol, Ethyl (B): <10 mg/dL (ref <10)

## 2022-05-13 LAB — CBG MONITORING, ED: Glucose-Capillary: 157 mg/dL — ABNORMAL HIGH (ref 70–99)

## 2022-05-13 MED ORDER — BUPROPION HCL ER (XL) 150 MG PO TB24
300.0000 mg | ORAL_TABLET | Freq: Every day | ORAL | Status: DC
Start: 2022-05-13 — End: 2022-05-15
  Administered 2022-05-13 – 2022-05-15 (×3): 300 mg via ORAL
  Filled 2022-05-13: qty 2
  Filled 2022-05-13: qty 1
  Filled 2022-05-13: qty 2

## 2022-05-13 MED ORDER — LURASIDONE HCL 20 MG PO TABS
20.0000 mg | ORAL_TABLET | Freq: Every day | ORAL | Status: DC
Start: 2022-05-13 — End: 2022-05-15
  Administered 2022-05-13 – 2022-05-14 (×2): 20 mg via ORAL
  Filled 2022-05-13 (×3): qty 1

## 2022-05-13 MED ORDER — ASENAPINE MALEATE 5 MG SL SUBL
5.0000 mg | SUBLINGUAL_TABLET | Freq: Every day | SUBLINGUAL | Status: DC
  Administered 2022-05-13 – 2022-05-14 (×2): 5 mg via SUBLINGUAL
  Filled 2022-05-13 (×3): qty 1

## 2022-05-13 MED ORDER — ROSUVASTATIN CALCIUM 10 MG PO TABS
10.0000 mg | ORAL_TABLET | Freq: Every day | ORAL | Status: DC
Start: 2022-05-13 — End: 2022-05-15
  Administered 2022-05-13 – 2022-05-15 (×3): 10 mg via ORAL
  Filled 2022-05-13 (×3): qty 1

## 2022-05-13 MED ORDER — CHLORHEXIDINE GLUCONATE CLOTH 2 % EX PADS
6.0000 | MEDICATED_PAD | Freq: Every day | CUTANEOUS | Status: DC
Start: 2022-05-13 — End: 2022-05-15
  Administered 2022-05-13 – 2022-05-15 (×3): 6 via TOPICAL

## 2022-05-13 MED ORDER — LINACLOTIDE 145 MCG PO CAPS
290.0000 ug | ORAL_CAPSULE | Freq: Every day | ORAL | Status: DC
  Administered 2022-05-15: 290 ug via ORAL
  Filled 2022-05-13: qty 1
  Filled 2022-05-13: qty 2
  Filled 2022-05-13 (×3): qty 1

## 2022-05-13 MED ORDER — MOMETASONE FURO-FORMOTEROL FUM 200-5 MCG/ACT IN AERO
2.0000 | INHALATION_SPRAY | Freq: Two times a day (BID) | RESPIRATORY_TRACT | Status: DC
  Administered 2022-05-13: 2 via RESPIRATORY_TRACT
  Filled 2022-05-13: qty 8.8

## 2022-05-13 MED ORDER — INSULIN ASPART 100 UNIT/ML IJ SOLN
0.0000 [IU] | Freq: Three times a day (TID) | INTRAMUSCULAR | Status: DC
  Administered 2022-05-13 – 2022-05-14 (×2): 3 [IU] via SUBCUTANEOUS
  Administered 2022-05-14: 5 [IU] via SUBCUTANEOUS
  Administered 2022-05-14 – 2022-05-15 (×2): 3 [IU] via SUBCUTANEOUS
  Filled 2022-05-13 (×5): qty 1

## 2022-05-13 MED ORDER — ALPRAZOLAM 1 MG PO TABS
1.0000 mg | ORAL_TABLET | Freq: Three times a day (TID) | ORAL | Status: DC | PRN
  Administered 2022-05-13 – 2022-05-14 (×2): 1 mg via ORAL
  Filled 2022-05-13 (×2): qty 1

## 2022-05-13 MED ORDER — DAPAGLIFLOZIN PROPANEDIOL 10 MG PO TABS
10.0000 mg | ORAL_TABLET | Freq: Every day | ORAL | Status: DC
  Administered 2022-05-13 – 2022-05-15 (×3): 10 mg via ORAL
  Filled 2022-05-13 (×3): qty 1

## 2022-05-13 MED ORDER — BUSPIRONE HCL 10 MG PO TABS
15.0000 mg | ORAL_TABLET | Freq: Two times a day (BID) | ORAL | Status: DC
  Administered 2022-05-13 – 2022-05-15 (×5): 15 mg via ORAL
  Filled 2022-05-13 (×5): qty 2

## 2022-05-13 MED ORDER — LATANOPROST 0.005 % OP SOLN
1.0000 [drp] | Freq: Every day | OPHTHALMIC | Status: DC
  Administered 2022-05-13 – 2022-05-14 (×2): 1 [drp] via OPHTHALMIC
  Filled 2022-05-13 (×2): qty 2.5

## 2022-05-13 MED ORDER — INSULIN ASPART 100 UNIT/ML IJ SOLN
0.0000 [IU] | Freq: Every day | INTRAMUSCULAR | Status: DC
  Administered 2022-05-13: 2 [IU] via SUBCUTANEOUS
  Filled 2022-05-13: qty 1

## 2022-05-13 NOTE — Progress Notes (Signed)
OT Cancellation Note  Patient Details Name: Rachel Nielsen MRN: [id number] DOB: 1951/01/27   Cancelled Treatment:    Reason Eval/Treat Not Completed: Patient not medically ready. Consult received and chart reviewed.  Patient admitted to ICU for CVA work up with TNK administration (1442, 05/12/22).  Per guidelines, to be on strict bedrest x24 hours post infusion and follow up imaging required to be completed prior to initiation of therapy.  Will follow and initiate services when cleared by imaging.   Dessie Coma, M.S. OTR/L  05/13/22, 1:40 PM  ascom [phone]

## 2022-05-13 NOTE — ED Notes (Signed)
MD messaged in regard to NPO status and home medications.

## 2022-05-13 NOTE — ED Notes (Signed)
RN aware of bed assigned 

## 2022-05-13 NOTE — Progress Notes (Signed)
CRITICAL CARE PROGRESS NOTE    Name: Rachel Nielsen MRN: [id number] DOB: 23-Nov-1950     LOS: 1   SUBJECTIVE FINDINGS & SIGNIFICANT EVENTS    Patient description:  This is 71 year old female with a history of centrilobular emphysema and COPD with OSA overlap syndrome, GERD, chronic constipation, TIA, essential hypertension, lumbar radiculopathy, major depressive disorder, morbid obesity who came in with altered mental status with reduced memory and mental sharpness.  She had findings consistent with acute CVA with CTA showing possible occlusion of left M3 MCA.  She was seen by neurology and is status post thrombolysis with TNK.  Medical ICU admission for post thrombolysis neuro monitoring   05/13/22- patient doing well no overnight events, passed bedside swallow started diet and po home medications. Finishing up post thrombolysis period for acute CVA.   Lines/tubes :   Microbiology/Sepsis markers: Results for orders placed or performed in visit on 01/26/22  CULTURE, URINE COMPREHENSIVE     Status: None   Collection Time: 01/26/22 11:04 AM   Specimen: Urine   UR  Result Value Ref Range Status   Urine Culture, Comprehensive Final report  Final   Organism ID, Bacteria Comment  Final    Comment: Mixed urogenital flora 10,000-25,000 colony forming units per mL   Microscopic Examination     Status: None   Collection Time: 01/26/22 11:04 AM   Urine  Result Value Ref Range Status   WBC, UA 0-5 0 - 5 /hpf Final   RBC, Urine 0-2 0 - 2 /hpf Final   Epithelial Cells (non renal) 0-10 0 - 10 /hpf Final   Bacteria, UA None seen None seen/Few Final    Anti-infectives:  Anti-infectives (From admission, onward)    None        PAST MEDICAL HISTORY   Past Medical History:  Diagnosis Date   Allergy    Anxiety     Asthma    Colon polyps    Constipation    COPD (chronic obstructive pulmonary disease) (HCC)    Depression    GERD (gastroesophageal reflux disease)    Glaucoma    Heart attack ([place])    Hypertension    Sleep apnea    Sleep apnea    Stroke [facility])    Urinary incontinence      SURGICAL HISTORY   Past Surgical History:  Procedure Laterality Date   APPENDECTOMY     CHOLECYSTECTOMY     COLONOSCOPY WITH PROPOFOL N/A 07/20/2018   Procedure: COLONOSCOPY WITH PROPOFOL;  Surgeon: Toledo, Benay Pike, MD;  Location: [facility] ENDOSCOPY;  Service: Gastroenterology;  Laterality: N/A;   ESOPHAGOGASTRODUODENOSCOPY N/A 07/20/2018   Procedure: ESOPHAGOGASTRODUODENOSCOPY (EGD);  Surgeon: Toledo, Benay Pike, MD;  Location: [facility] ENDOSCOPY;  Service: Gastroenterology;  Laterality: N/A;   GASTRECTOMY     TUBAL LIGATION     VAGINAL HYSTERECTOMY       FAMILY HISTORY   Family History  Problem Relation Age of Onset   Hypertension Mother    Atrial fibrillation Mother    Heart attack Mother    Hypertension Father    Stomach cancer Father    Diabetes Father    Liver cancer Father    Diabetes Sister    Hypertension Sister      SOCIAL HISTORY   Social History   Tobacco Use   Smoking status: Former   Smokeless tobacco: Never   Tobacco comments:    4 months ago  Vaping Use   Vaping Use: Some days  Substances: Nicotine, Flavoring  Substance Use Topics   Alcohol use: Not Currently    Comment: monthly or less    Drug use: No     MEDICATIONS   Current Medication:  Current Facility-Administered Medications:    Chlorhexidine Gluconate Cloth 2 % PADS 6 each, 6 each, Topical, Daily, Kailiana Granquist, MD   docusate sodium (COLACE) capsule 100 mg, 100 mg, Oral, BID PRN, Ottie Glazier, MD   labetalol (NORMODYNE) injection 10 mg, 10 mg, Intravenous, Q2H PRN, Darel Hong D, NP   pantoprazole (PROTONIX) injection 40 mg, 40 mg, Intravenous, Daily, Lanney Gins, Luke Rigsbee, MD, 40 mg at 05/12/22 1713    polyethylene glycol (MIRALAX / GLYCOLAX) packet 17 g, 17 g, Oral, Daily PRN, Ottie Glazier, MD    ALLERGIES   Ace inhibitors    REVIEW OF SYSTEMS    10 point ROS conducted and is negative except as per subjective find  PHYSICAL EXAMINATION   Vital Signs: Temp:  [97.8 F (36.6 C)-98.6 F (37 C)] 97.9 F (36.6 C) (07/12 0800) Pulse Rate:  [79-87] 80 (07/12 0800) Resp:  [12-40] 24 (07/12 0800) BP: (93-153)/(57-93) 109/57 (07/12 0800) SpO2:  [93 %-100 %] 100 % (07/12 0800) Weight:  [95.8 kg] 95.8 kg (07/12 0800)  GENERAL: Age-appropriate no distress HEAD: Normocephalic, atraumatic.  EYES: Pupils equal, round, reactive to light.  No scleral icterus.  MOUTH: Moist mucosal membrane. NECK: Supple. No thyromegaly. No nodules. No JVD.  PULMONARY: Faythe Dingwall to auscultation CARDIOVASCULAR: S1 and S2. Regular rate and rhythm. No murmurs, rubs, or gallops.  GASTROINTESTINAL: Soft, nontender, non-distended. No masses. Positive bowel sounds. No hepatosplenomegaly.  MUSCULOSKELETAL: Right-sided weakness with 3 out of 4 in the right upper extremity and 3 out of 4 in the right lower extremity and 4 out of 4 in both left upper and lower extremities, symmetric smile NEUROLOGIC: GCS 14 SKIN:intact,warm,dry   PERTINENT DATA     Infusions:  Scheduled Medications:  Chlorhexidine Gluconate Cloth  6 each Topical Daily   pantoprazole (PROTONIX) IV  40 mg Intravenous Daily   PRN Medications:  Hemodynamic parameters:   Intake/Output: 07/11 0701 - 07/12 0700 In: -  Out: 700 [Urine:700]  Ventilator  Settings:   LAB RESULTS:  Basic Metabolic Panel: Recent Labs  Lab 05/12/22 1357 05/12/22 1429 05/13/22 0412  NA 140  --  140  K 3.8  --  4.2  CL 109  --  107  CO2 25  --  26  GLUCOSE 127*  --  222*  BUN 17  --  14  CREATININE 0.95 1.00 1.01*  1.10*  CALCIUM 8.9  --  8.7*  MG  --   --  2.0  PHOS  --   --  3.3    Liver Function Tests: Recent Labs  Lab 05/12/22 1357  AST  21  ALT 19  ALKPHOS 70  BILITOT 0.5  PROT 6.9  ALBUMIN 3.6    No results for input(s): "LIPASE", "AMYLASE" in the last 168 hours. No results for input(s): "AMMONIA" in the last 168 hours. CBC: Recent Labs  Lab 05/12/22 1357 05/13/22 0412  WBC 9.7 8.6  NEUTROABS 5.4  --   HGB 12.5 12.2  HCT 42.5 41.7  MCV 84.5 84.4  PLT 208 186    Cardiac Enzymes: No results for input(s): "CKTOTAL", "CKMB", "CKMBINDEX", "TROPONINI" in the last 168 hours. BNP: Invalid input(s): "POCBNP" CBG: Recent Labs  Lab 05/13/22 0011  GLUCAP 157*       IMAGING RESULTS:  Imaging: DG Chest  Port 1 View  Result Date: 05/13/2022 CLINICAL DATA:  71 year old female with history of COPD. EXAM: PORTABLE CHEST 1 VIEW COMPARISON:  Chest x-ray 12/21/2021. FINDINGS: Lung volumes are low. Elevation of the right hemidiaphragm, unchanged. No consolidative airspace disease. No pleural effusions. No pneumothorax. No pulmonary nodule or mass noted. Pulmonary vasculature and the cardiomediastinal silhouette are within normal limits. IMPRESSION: 1. Low lung volumes without radiographic evidence of acute cardiopulmonary disease. 2. Persistent elevation of the right hemidiaphragm. Electronically Signed   By: Vinnie Langton M.D.   On: 05/13/2022 05:56   CT ANGIO HEAD NECK W WO CM (CODE STROKE)  Result Date: 05/12/2022 CLINICAL DATA:  Neuro deficit, acute, stroke suspected EXAM: CT ANGIOGRAPHY HEAD AND NECK TECHNIQUE: Multidetector CT imaging of the head and neck was performed using the standard protocol during bolus administration of intravenous contrast. Multiplanar CT image reconstructions and MIPs were obtained to evaluate the vascular anatomy. Carotid stenosis measurements (when applicable) are obtained utilizing NASCET criteria, using the distal internal carotid diameter as the denominator. RADIATION DOSE REDUCTION: This exam was performed according to the departmental dose-optimization program which includes  automated exposure control, adjustment of the mA and/or kV according to patient size and/or use of iterative reconstruction technique. CONTRAST:  62m OMNIPAQUE IOHEXOL 350 MG/ML SOLN COMPARISON:  None Available. FINDINGS: CTA NECK FINDINGS Aortic arch: Great vessel origins are patent. Right carotid system: No evidence of dissection, stenosis (50% or greater) or occlusion. Left carotid system: No evidence of dissection, stenosis (50% or greater) or occlusion. Vertebral arteries: Left dominant. No evidence of dissection, stenosis (50% or greater) or occlusion. Skeleton: No acute findings.  Multilevel facet arthropathy. Other neck: No acute findings. Upper chest: Visualized lung apices are clear. Review of the MIP images confirms the above findings CTA HEAD FINDINGS Anterior circulation: Bilateral intracranial ICAs, M1 and M2 MCA branches are patent. Possible stenosis versus occlusion of a small left M3 MCA vessel, not well characterized due to small size (for example see series 9, images 135 through 39). Bilateral ACAs are patent. Posterior circulation: Bilateral intradural vertebral arteries basilar artery and PCAs are patent without proximal hemodynamically significant stenosis. Small P1 PCAs with bilateral posterior communicating arteries and small vertebrobasilar system, anatomic variant. Venous sinuses: As permitted by contrast timing, patent. Anatomic variants: Detailed above Review of the MIP images confirms the above findings IMPRESSION: 1. Possible severe stenosis or occlusion of a small left M3 MCA branch not well characterized due to small size. 2. No proximal large vessel occlusion. Findings discussed with Dr. LJeani Hawkingvia telephone at 2:43 p.m. Electronically Signed   By: FMargaretha SheffieldM.D.   On: 05/12/2022 14:52   CT HEAD CODE STROKE WO CONTRAST  Result Date: 05/12/2022 CLINICAL DATA:  Code stroke. Neuro deficit, acute, stroke suspected. Last known normal 2 hours ago. Right-sided weakness and right  facial droop. EXAM: CT HEAD WITHOUT CONTRAST TECHNIQUE: Contiguous axial images were obtained from the base of the skull through the vertex without intravenous contrast. RADIATION DOSE REDUCTION: This exam was performed according to the departmental dose-optimization program which includes automated exposure control, adjustment of the mA and/or kV according to patient size and/or use of iterative reconstruction technique. COMPARISON:  CT head without contrast 10/21/2020. MR head 12/26/2020 FINDINGS: Brain: No acute infarct, hemorrhage, or mass lesion is present. Basal ganglia are intact. Insular ribbon is normal. No acute or focal cortical abnormality is present. No significant white matter lesions are present. The brainstem and cerebellum are within normal limits. The ventricles are  of normal size. No significant extraaxial fluid collection is present. Vascular: No hyperdense vessel or unexpected calcification. Skull: Calvarium is intact. No focal lytic or blastic lesions are present. No significant extracranial soft tissue lesion is present. Sinuses/Orbits: The paranasal sinuses and mastoid air cells are clear. The globes and orbits are within normal limits. ASPECTS [facility] Stroke Program Early CT Score) - Ganglionic level infarction (caudate, lentiform nuclei, internal capsule, insula, M1-M3 cortex): 7/7 - Supraganglionic infarction (M4-M6 cortex): 3/3 Total score (0-10 with 10 being normal): 10/10 IMPRESSION: Negative CT of the head. Electronically Signed   By: San Morelle M.D.   On: 05/12/2022 14:13   '@PROBHOSP'$ @ DG Chest Port 1 View  Result Date: 05/13/2022 CLINICAL DATA:  71 year old female with history of COPD. EXAM: PORTABLE CHEST 1 VIEW COMPARISON:  Chest x-ray 12/21/2021. FINDINGS: Lung volumes are low. Elevation of the right hemidiaphragm, unchanged. No consolidative airspace disease. No pleural effusions. No pneumothorax. No pulmonary nodule or mass noted. Pulmonary vasculature and the  cardiomediastinal silhouette are within normal limits. IMPRESSION: 1. Low lung volumes without radiographic evidence of acute cardiopulmonary disease. 2. Persistent elevation of the right hemidiaphragm. Electronically Signed   By: Vinnie Langton M.D.   On: 05/13/2022 05:56   CT ANGIO HEAD NECK W WO CM (CODE STROKE)  Result Date: 05/12/2022 CLINICAL DATA:  Neuro deficit, acute, stroke suspected EXAM: CT ANGIOGRAPHY HEAD AND NECK TECHNIQUE: Multidetector CT imaging of the head and neck was performed using the standard protocol during bolus administration of intravenous contrast. Multiplanar CT image reconstructions and MIPs were obtained to evaluate the vascular anatomy. Carotid stenosis measurements (when applicable) are obtained utilizing NASCET criteria, using the distal internal carotid diameter as the denominator. RADIATION DOSE REDUCTION: This exam was performed according to the departmental dose-optimization program which includes automated exposure control, adjustment of the mA and/or kV according to patient size and/or use of iterative reconstruction technique. CONTRAST:  40m OMNIPAQUE IOHEXOL 350 MG/ML SOLN COMPARISON:  None Available. FINDINGS: CTA NECK FINDINGS Aortic arch: Great vessel origins are patent. Right carotid system: No evidence of dissection, stenosis (50% or greater) or occlusion. Left carotid system: No evidence of dissection, stenosis (50% or greater) or occlusion. Vertebral arteries: Left dominant. No evidence of dissection, stenosis (50% or greater) or occlusion. Skeleton: No acute findings.  Multilevel facet arthropathy. Other neck: No acute findings. Upper chest: Visualized lung apices are clear. Review of the MIP images confirms the above findings CTA HEAD FINDINGS Anterior circulation: Bilateral intracranial ICAs, M1 and M2 MCA branches are patent. Possible stenosis versus occlusion of a small left M3 MCA vessel, not well characterized due to small size (for example see series  9, images 135 through 39). Bilateral ACAs are patent. Posterior circulation: Bilateral intradural vertebral arteries basilar artery and PCAs are patent without proximal hemodynamically significant stenosis. Small P1 PCAs with bilateral posterior communicating arteries and small vertebrobasilar system, anatomic variant. Venous sinuses: As permitted by contrast timing, patent. Anatomic variants: Detailed above Review of the MIP images confirms the above findings IMPRESSION: 1. Possible severe stenosis or occlusion of a small left M3 MCA branch not well characterized due to small size. 2. No proximal large vessel occlusion. Findings discussed with Dr. LJeani Hawkingvia telephone at 2:43 p.m. Electronically Signed   By: FMargaretha SheffieldM.D.   On: 05/12/2022 14:52   CT HEAD CODE STROKE WO CONTRAST  Result Date: 05/12/2022 CLINICAL DATA:  Code stroke. Neuro deficit, acute, stroke suspected. Last known normal 2 hours ago. Right-sided weakness and right  facial droop. EXAM: CT HEAD WITHOUT CONTRAST TECHNIQUE: Contiguous axial images were obtained from the base of the skull through the vertex without intravenous contrast. RADIATION DOSE REDUCTION: This exam was performed according to the departmental dose-optimization program which includes automated exposure control, adjustment of the mA and/or kV according to patient size and/or use of iterative reconstruction technique. COMPARISON:  CT head without contrast 10/21/2020. MR head 12/26/2020 FINDINGS: Brain: No acute infarct, hemorrhage, or mass lesion is present. Basal ganglia are intact. Insular ribbon is normal. No acute or focal cortical abnormality is present. No significant white matter lesions are present. The brainstem and cerebellum are within normal limits. The ventricles are of normal size. No significant extraaxial fluid collection is present. Vascular: No hyperdense vessel or unexpected calcification. Skull: Calvarium is intact. No focal lytic or blastic lesions are  present. No significant extracranial soft tissue lesion is present. Sinuses/Orbits: The paranasal sinuses and mastoid air cells are clear. The globes and orbits are within normal limits. ASPECTS [facility] Stroke Program Early CT Score) - Ganglionic level infarction (caudate, lentiform nuclei, internal capsule, insula, M1-M3 cortex): 7/7 - Supraganglionic infarction (M4-M6 cortex): 3/3 Total score (0-10 with 10 being normal): 10/10 IMPRESSION: Negative CT of the head. Electronically Signed   By: San Morelle M.D.   On: 05/12/2022 14:13     ASSESSMENT AND PLAN    -Multidisciplinary rounds held today  Acute left MCA stroke -Neurology on case-appreciate input -That is post thrombolysis with TNKase -Postinfusion neuro monitoring Every hour neurochecks -P.o. statin post SLP -Antiplatelet agents per neurology  COPD with OSA overlap syndrome -Continue CPAP nightly with settings per RT  Renal Failure-most likely due to ATN -follow chem 7 -follow UO -continue Foley Catheter-assess need daily  Major depressive disorder - continue home antidepressant regimen   GI/Nutrition GI PROPHYLAXIS as indicated DIET-->TF's as tolerated Constipation protocol as indicated  ENDO - ICU hypoglycemic\Hyperglycemia protocol -check FSBS per protocol   ELECTROLYTES -follow labs as needed -replace as needed -pharmacy consultation   DVT/GI PRX ordered -SCDs  TRANSFUSIONS AS NEEDED MONITOR FSBS ASSESS the need for LABS as needed    Critical care provider statement:   Total critical care time: 68 minutes   Performed by: Lanney Gins MD   Critical care time was exclusive of separately billable procedures and treating other patients.   Critical care was necessary to treat or prevent imminent or life-threatening deterioration.   Critical care was time spent personally by me on the following activities: development of treatment plan with patient and/or surrogate as well as nursing,  discussions with consultants, evaluation of patient's response to treatment, examination of patient, obtaining history from patient or surrogate, ordering and performing treatments and interventions, ordering and review of laboratory studies, ordering and review of radiographic studies, pulse oximetry and re-evaluation of patient's condition.    Ottie Glazier, M.D.  Pulmonary & Critical Care Medicine

## 2022-05-13 NOTE — Progress Notes (Signed)
*  PRELIMINARY RESULTS* Echocardiogram 2D Echocardiogram has been performed.  [name] 05/13/2022, 8:26 AM

## 2022-05-13 NOTE — Consult Note (Signed)
PHARMACY CONSULT NOTE - FOLLOW UP  Pharmacy Consult for Electrolyte Monitoring and Replacement   Recent Labs: Potassium (mmol/L)  Date Value  05/13/2022 4.2   Magnesium (mg/dL)  Date Value  05/13/2022 2.0   Calcium (mg/dL)  Date Value  05/13/2022 8.7 (L)   Albumin (g/dL)  Date Value  05/12/2022 3.6  10/19/2019 4.1   Phosphorus (mg/dL)  Date Value  05/13/2022 3.3   Sodium (mmol/L)  Date Value  05/13/2022 140  10/19/2019 141     Assessment: 71 yo female with h/o centrilobular emphysema and COPD with OSA overlap syndrome, GERD, chronic constipation, TIA, HTN, lumbar radiculopathy, MDD, morbid obesity who came in with altered mental status with reduced memory and mental sharpness. W/u c/w CVA & CTA showing possible occlusion of left M3 MCA, resulting in TNK administration and admit for ICU monitoring post-thrombolysis. Pharmacy c/s'd for electrolyte mgmt.  Goal of Therapy:  Electrolytes WNL  Plan:  Lytes are currently WNL and Scr is stable 0.9-1. Will CTM and replace PRN. Next electrolytes in am  Dallie Piles ,PharmD Clinical Pharmacist 05/13/2022 7:47 AM

## 2022-05-13 NOTE — Progress Notes (Signed)
Subjective: States that she is significantly improved today.   Objective: Current vital signs: BP 131/76 (BP Location: Left Arm)   Pulse 84   Temp 98.3 F (36.8 C)   Resp 16   Ht '5\' 4"'$  (1.626 m)   Wt 95.8 kg   SpO2 94%   BMI 36.25 kg/m  Vital signs in last 24 hours: Temp:  [97.8 F (36.6 C)-98.3 F (36.8 C)] 98.3 F (36.8 C) (07/12 1929) Pulse Rate:  [78-86] 84 (07/12 1929) Resp:  [15-38] 16 (07/12 1929) BP: (93-137)/(57-89) 131/76 (07/12 1929) SpO2:  [93 %-100 %] 94 % (07/12 1929) Weight:  [95.8 kg] 95.8 kg (07/12 0800)  Intake/Output from previous day: 07/11 0701 - 07/12 0700 In: -  Out: 700 [Urine:700] Intake/Output this shift: No intake/output data recorded. Nutritional status:  Diet Order             Diet Carb Modified Fluid consistency: Thin; Room service appropriate? Yes  Diet effective now                  Physical Exam  HEENT-  [place]/AT    Lungs- Respirations unlabored Extremities- No edema  Neurologic Exam: Ment: Speech is fluent and nondysarthric. Bright affect.  CN: Temporal visual fields intact with no extinction to DSS. Tracks and fixates normally Smile is symmetric.  Motor: 5/5 LUE and BLE. 4+/5 RUE. No pronator drift.  Sensory: Mildly diminished FT sensation to RUE Cerebellar: No ataxia with FNF bilaterally. Fine action tremor on the left.  Gait: Deferred  Lab Results: Results for orders placed or performed during the hospital encounter of 05/12/22 (from the past 48 hour(s))  Protime-INR     Status: None   Collection Time: 05/12/22  1:57 PM  Result Value Ref Range   Prothrombin Time 13.3 11.4 - 15.2 seconds   INR 1.0 0.8 - 1.2    Comment: (NOTE) INR goal varies based on device and disease states. Performed at [facility], [address] [place], [place] [address]   APTT     Status: None   Collection Time: 05/12/22  1:57 PM  Result Value Ref Range   aPTT 29 24 - 36 seconds    Comment: Performed at [facility],  [address] [place], [place] [address]  CBC     Status: Abnormal   Collection Time: 05/12/22  1:57 PM  Result Value Ref Range   WBC 9.7 4.0 - 10.5 K/uL   RBC 5.03 3.87 - 5.11 MIL/uL   Hemoglobin 12.5 12.0 - 15.0 g/dL   HCT 42.5 36.0 - 46.0 %   MCV 84.5 80.0 - 100.0 fL   MCH 24.9 (L) 26.0 - 34.0 pg   MCHC 29.4 (L) 30.0 - 36.0 g/dL   RDW 16.1 (H) 11.5 - 15.5 %   Platelets 208 150 - 400 K/uL   nRBC 0.0 0.0 - 0.2 %    Comment: Performed at [facility], [address] [place], [place] [address]  Differential     Status: Abnormal   Collection Time: 05/12/22  1:57 PM  Result Value Ref Range   Neutrophils Relative % 55 %   Neutro Abs 5.4 1.7 - 7.7 K/uL   Lymphocytes Relative 31 %   Lymphs Abs 3.0 0.7 - 4.0 K/uL   Monocytes Relative 7 %   Monocytes Absolute 0.7 0.1 - 1.0 K/uL   Eosinophils Relative 6 %   Eosinophils Absolute 0.6 (H) 0.0 - 0.5 K/uL   Basophils Relative 0 %  Basophils Absolute 0.0 0.0 - 0.1 K/uL   WBC Morphology MORPHOLOGY UNREMARKABLE    RBC Morphology MORPHOLOGY UNREMARKABLE    Smear Review Normal platelet morphology    Immature Granulocytes 1 %   Abs Immature Granulocytes 0.05 0.00 - 0.07 K/uL    Comment: Performed at [facility], [address] [place], [place] [address]  Comprehensive metabolic panel     Status: Abnormal   Collection Time: 05/12/22  1:57 PM  Result Value Ref Range   Sodium 140 135 - 145 mmol/L   Potassium 3.8 3.5 - 5.1 mmol/L   Chloride 109 98 - 111 mmol/L   CO2 25 22 - 32 mmol/L   Glucose, Bld 127 (H) 70 - 99 mg/dL    Comment: Glucose reference range applies only to samples taken after fasting for at least 8 hours.   BUN 17 8 - 23 mg/dL   Creatinine, Ser 0.95 0.44 - 1.00 mg/dL   Calcium 8.9 8.9 - 10.3 mg/dL   Total Protein 6.9 6.5 - 8.1 g/dL   Albumin 3.6 3.5 - 5.0 g/dL   AST 21 15 - 41 U/L   ALT 19 0 - 44 U/L   Alkaline Phosphatase 70 38 - 126 U/L   Total Bilirubin 0.5 0.3 - 1.2 mg/dL   GFR, Estimated >60  >60 mL/min    Comment: (NOTE) Calculated using the CKD-EPI Creatinine Equation (2021)    Anion gap 6 5 - 15    Comment: Performed at [facility], [address], [place], [place] [address]  I-stat Creatinine, ED     Status: None   Collection Time: 05/12/22  2:29 PM  Result Value Ref Range   Creatinine, Ser 1.00 0.44 - 1.00 mg/dL  CBG monitoring, ED     Status: Abnormal   Collection Time: 05/13/22 12:11 AM  Result Value Ref Range   Glucose-Capillary 157 (H) 70 - 99 mg/dL    Comment: Glucose reference range applies only to samples taken after fasting for at least 8 hours.  CBC     Status: Abnormal   Collection Time: 05/13/22  4:12 AM  Result Value Ref Range   WBC 8.6 4.0 - 10.5 K/uL   RBC 4.94 3.87 - 5.11 MIL/uL   Hemoglobin 12.2 12.0 - 15.0 g/dL   HCT 41.7 36.0 - 46.0 %   MCV 84.4 80.0 - 100.0 fL   MCH 24.7 (L) 26.0 - 34.0 pg   MCHC 29.3 (L) 30.0 - 36.0 g/dL   RDW 16.0 (H) 11.5 - 15.5 %   Platelets 186 150 - 400 K/uL   nRBC 0.0 0.0 - 0.2 %    Comment: Performed at [facility], [address] [place], [place] [address]  Basic metabolic panel     Status: Abnormal   Collection Time: 05/13/22  4:12 AM  Result Value Ref Range   Sodium 140 135 - 145 mmol/L   Potassium 4.2 3.5 - 5.1 mmol/L   Chloride 107 98 - 111 mmol/L   CO2 26 22 - 32 mmol/L   Glucose, Bld 222 (H) 70 - 99 mg/dL    Comment: Glucose reference range applies only to samples taken after fasting for at least 8 hours.   BUN 14 8 - 23 mg/dL   Creatinine, Ser 1.01 (H) 0.44 - 1.00 mg/dL   Calcium 8.7 (L) 8.9 - 10.3 mg/dL   GFR, Estimated >60 >60 mL/min    Comment: (NOTE) Calculated using the CKD-EPI Creatinine Equation (2021)  Anion gap 7 5 - 15    Comment: Performed at [facility], [address] [place], [place] [address]  Magnesium     Status: None   Collection Time: 05/13/22  4:12 AM  Result Value Ref Range   Magnesium 2.0 1.7 - 2.4 mg/dL    Comment: Performed at [facility], [address] [place], [place] [address]  Phosphorus     Status: None   Collection Time: 05/13/22  4:12 AM  Result Value Ref Range   Phosphorus 3.3 2.5 - 4.6 mg/dL    Comment: Performed at [facility], [address] [place], [place] [address]  Creatinine, serum     Status: Abnormal   Collection Time: 05/13/22  4:12 AM  Result Value Ref Range   Creatinine, Ser 1.10 (H) 0.44 - 1.00 mg/dL   GFR, Estimated 54 (L) >60 mL/min    Comment: (NOTE) Calculated using the CKD-EPI Creatinine Equation (2021) Performed at [facility], [address] [place], [place] [address]   Ethanol     Status: None   Collection Time: 05/13/22  4:12 AM  Result Value Ref Range   Alcohol, Ethyl (B) <10 <10 mg/dL    Comment: (NOTE) Lowest detectable limit for serum alcohol is 10 mg/dL.  For medical purposes only. Performed at [facility], [address] [place], [place] [address]   Urinalysis, Routine w reflex microscopic Urine, Clean Catch     Status: Abnormal   Collection Time: 05/13/22  4:17 AM  Result Value Ref Range   Color, Urine YELLOW (A) YELLOW   APPearance HAZY (A) CLEAR   Specific Gravity, Urine 1.029 1.005 - 1.030   pH 5.0 5.0 - 8.0   Glucose, UA NEGATIVE NEGATIVE mg/dL   Hgb urine dipstick SMALL (A) NEGATIVE   Bilirubin Urine NEGATIVE NEGATIVE   Ketones, ur NEGATIVE NEGATIVE mg/dL   Protein, ur NEGATIVE NEGATIVE mg/dL   Nitrite POSITIVE (A) NEGATIVE   Leukocytes,Ua SMALL (A) NEGATIVE   RBC / HPF 0-5 0 - 5 RBC/hpf   WBC, UA 11-20 0 - 5 WBC/hpf   Bacteria, UA FEW (A) NONE SEEN   Squamous Epithelial / LPF 0-5 0 - 5   Mucus PRESENT     Comment: Performed at [facility], [address] [place], [place] [address]  MRSA Next Gen by PCR, Nasal     Status: None   Collection Time: 05/13/22  9:03 AM   Specimen: Nasal Mucosa; Nasal Swab  Result Value Ref Range   MRSA by PCR Next Gen NOT DETECTED NOT DETECTED    Comment: (NOTE) The  GeneXpert MRSA Assay (FDA approved for NASAL specimens only), is one component of a comprehensive MRSA colonization surveillance program. It is not intended to diagnose MRSA infection nor to guide or monitor treatment for MRSA infections. Test performance is not FDA approved in patients less than 55 years old. Performed at [facility], [address] [place], [place] [address]   Glucose, capillary     Status: Abnormal   Collection Time: 05/13/22  6:07 PM  Result Value Ref Range   Glucose-Capillary 162 (H) 70 - 99 mg/dL    Comment: Glucose reference range applies only to samples taken after fasting for at least 8 hours.  MRSA Next Gen by PCR, Nasal     Status: None   Collection Time: 05/13/22  6:35 PM   Specimen: Nasal Mucosa; Nasal Swab  Result Value Ref Range   MRSA  by PCR Next Gen NOT DETECTED NOT DETECTED    Comment: (NOTE) The GeneXpert MRSA Assay (FDA approved for NASAL specimens only), is one component of a comprehensive MRSA colonization surveillance program. It is not intended to diagnose MRSA infection nor to guide or monitor treatment for MRSA infections. Test performance is not FDA approved in patients less than 36 years old. Performed at [facility], [address] [place], [place] [address]   Glucose, capillary     Status: Abnormal   Collection Time: 05/13/22  8:22 PM  Result Value Ref Range   Glucose-Capillary 232 (H) 70 - 99 mg/dL    Comment: Glucose reference range applies only to samples taken after fasting for at least 8 hours.    Recent Results (from the past 240 hour(s))  MRSA Next Gen by PCR, Nasal     Status: None   Collection Time: 05/13/22  9:03 AM   Specimen: Nasal Mucosa; Nasal Swab  Result Value Ref Range Status   MRSA by PCR Next Gen NOT DETECTED NOT DETECTED Final    Comment: (NOTE) The GeneXpert MRSA Assay (FDA approved for NASAL specimens only), is one component of a comprehensive MRSA colonization surveillance program.  It is not intended to diagnose MRSA infection nor to guide or monitor treatment for MRSA infections. Test performance is not FDA approved in patients less than 27 years old. Performed at [facility], [address] [place], [place] [address]   MRSA Next Gen by PCR, Nasal     Status: None   Collection Time: 05/13/22  6:35 PM   Specimen: Nasal Mucosa; Nasal Swab  Result Value Ref Range Status   MRSA by PCR Next Gen NOT DETECTED NOT DETECTED Final    Comment: (NOTE) The GeneXpert MRSA Assay (FDA approved for NASAL specimens only), is one component of a comprehensive MRSA colonization surveillance program. It is not intended to diagnose MRSA infection nor to guide or monitor treatment for MRSA infections. Test performance is not FDA approved in patients less than 34 years old. Performed at [facility], [address] [place], [place] [address]     Lipid Panel No results for input(s): "CHOL", "TRIG", "HDL", "CHOLHDL", "VLDL", "LDLCALC" in the last 72 hours.  Studies/Results: CT HEAD WO CONTRAST (5MM)  Result Date: 05/13/2022 CLINICAL DATA:  Stroke.  Follow-up after T NK. EXAM: CT HEAD WITHOUT CONTRAST TECHNIQUE: Contiguous axial images were obtained from the base of the skull through the vertex without intravenous contrast. RADIATION DOSE REDUCTION: This exam was performed according to the departmental dose-optimization program which includes automated exposure control, adjustment of the mA and/or kV according to patient size and/or use of iterative reconstruction technique. COMPARISON:  MRI same day. FINDINGS: Brain: Mild age related atrophy. Subtle evidence of the acute infarction at the apex of a sulcus at the frontoparietal vertex. No evidence of hemorrhage or swelling. No other focal insult. No hydrocephalus or extra-axial collection. Vascular: There is atherosclerotic calcification of the major vessels at the base of the brain. Skull: Negative Sinuses/Orbits:  Clear/normal Other: None IMPRESSION: Subtle low density at the apex of a sulcus at the left frontoparietal vertex consistent with the acute infarction better shown by MRI. No evidence of swelling or hemorrhage. Otherwise, generalized brain atrophy. Electronically Signed   By: Nelson Chimes M.D.   On: 05/13/2022 15:29   ECHOCARDIOGRAM COMPLETE  Result Date: 05/13/2022    ECHOCARDIOGRAM REPORT   Patient Name:   AVIENDHA AZBELL Date of Exam: 05/13/2022  Medical Rec #:  [id number]     Height:       64.0 in Accession #:    [id number]    Weight:       220.0 lb Date of Birth:  Jan 02, 1951     BSA:          2.037 m Patient Age:    59 years      BP:           135/80 mmHg Patient Gender: F             HR:           78 bpm. Exam Location:  [facility] Procedure: 2D Echo, Cardiac Doppler and Color Doppler Indications:     Stroke I63.9  History:         Patient has no prior history of Echocardiogram examinations.                  COPD; Risk Factors:Hypertension.  Sonographer:     [name] Referring Phys:  [id number] Bradly Bienenstock Diagnosing Phys: Kate Sable MD  Sonographer Comments: Technically challenging study due to limited acoustic windows, no apical window and no subcostal window. Image acquisition challenging due to COPD. IMPRESSIONS  1. Left ventricular ejection fraction, by estimation, is 65 to 70%. The left ventricle has normal function. Left ventricular endocardial border not optimally defined to evaluate regional wall motion. There is mild left ventricular hypertrophy. Left ventricular diastolic function could not be evaluated.  2. Right ventricular systolic function is normal. The right ventricular size is not well visualized.  3. The mitral valve is normal in structure. No evidence of mitral valve regurgitation.  4. The aortic valve was not well visualized. Aortic valve regurgitation is not visualized. FINDINGS  Left Ventricle: Left ventricular ejection fraction, by estimation, is 65 to 70%. The left ventricle has  normal function. Left ventricular endocardial border not optimally defined to evaluate regional wall motion. The left ventricular internal cavity size was normal in size. There is mild left ventricular hypertrophy. Left ventricular diastolic function could not be evaluated. Right Ventricle: The right ventricular size is not well visualized. No increase in right ventricular wall thickness. Right ventricular systolic function is normal. Left Atrium: Left atrial size was normal in size. Right Atrium: Right atrial size was not well visualized. Pericardium: There is no evidence of pericardial effusion. Mitral Valve: The mitral valve is normal in structure. No evidence of mitral valve regurgitation. Tricuspid Valve: The tricuspid valve is normal in structure. Tricuspid valve regurgitation is trivial. Aortic Valve: The aortic valve was not well visualized. Aortic valve regurgitation is not visualized. Pulmonic Valve: The pulmonic valve was not well visualized. Pulmonic valve regurgitation is not visualized. Aorta: The aortic root is normal in size and structure. Venous: The inferior vena cava was not well visualized. IAS/Shunts: The interatrial septum was not well visualized.  LEFT VENTRICLE PLAX 2D LVIDd:         3.60 cm LVIDs:         2.40 cm LV PW:         1.10 cm LV IVS:        1.60 cm LVOT diam:     1.90 cm LVOT Area:     2.84 cm  LEFT ATRIUM         Index LA diam:    3.20 cm 1.57 cm/m   AORTA Ao Root diam: 3.10 cm TRICUSPID VALVE TR Peak grad:   15.2 mmHg TR Vmax:  195.00 cm/s  SHUNTS Systemic Diam: 1.90 cm Kate Sable MD Electronically signed by Kate Sable MD Signature Date/Time: 05/13/2022/2:39:53 PM    Final    MR BRAIN WO CONTRAST  Result Date: 05/13/2022 CLINICAL DATA:  Stroke follow-up. EXAM: MRI HEAD WITHOUT CONTRAST TECHNIQUE: Multiplanar, multiecho pulse sequences of the brain and surrounding structures were obtained without intravenous contrast. COMPARISON:  Head CT May 12, 2022.  MRI of the brain December 24, 2020 FINDINGS: Brain: Area of restricted diffusion in the left precentral gyrus measuring approximately 15 mm, consistent with acute/subacute infarct. No hemorrhage, hydrocephalus, extra-axial collection or mass lesion. No significant white matter disease. Mild parenchymal volume loss, more evident in the bilateral high convexity. Partially empty sella. Vascular: Normal flow voids. Skull and upper cervical spine: Normal marrow signal. Sinuses/Orbits: Negative. Other: None. IMPRESSION: 1. Small acute/subacute infarct the left precentral gyrus. 2. Mild parenchymal volume loss. Electronically Signed   By: Pedro Earls M.D.   On: 05/13/2022 11:10   DG Chest Port 1 View  Result Date: 05/13/2022 CLINICAL DATA:  71 year old female with history of COPD. EXAM: PORTABLE CHEST 1 VIEW COMPARISON:  Chest x-ray 12/21/2021. FINDINGS: Lung volumes are low. Elevation of the right hemidiaphragm, unchanged. No consolidative airspace disease. No pleural effusions. No pneumothorax. No pulmonary nodule or mass noted. Pulmonary vasculature and the cardiomediastinal silhouette are within normal limits. IMPRESSION: 1. Low lung volumes without radiographic evidence of acute cardiopulmonary disease. 2. Persistent elevation of the right hemidiaphragm. Electronically Signed   By: Vinnie Langton M.D.   On: 05/13/2022 05:56   CT ANGIO HEAD NECK W WO CM (CODE STROKE)  Result Date: 05/12/2022 CLINICAL DATA:  Neuro deficit, acute, stroke suspected EXAM: CT ANGIOGRAPHY HEAD AND NECK TECHNIQUE: Multidetector CT imaging of the head and neck was performed using the standard protocol during bolus administration of intravenous contrast. Multiplanar CT image reconstructions and MIPs were obtained to evaluate the vascular anatomy. Carotid stenosis measurements (when applicable) are obtained utilizing NASCET criteria, using the distal internal carotid diameter as the denominator. RADIATION DOSE  REDUCTION: This exam was performed according to the departmental dose-optimization program which includes automated exposure control, adjustment of the mA and/or kV according to patient size and/or use of iterative reconstruction technique. CONTRAST:  16m OMNIPAQUE IOHEXOL 350 MG/ML SOLN COMPARISON:  None Available. FINDINGS: CTA NECK FINDINGS Aortic arch: Great vessel origins are patent. Right carotid system: No evidence of dissection, stenosis (50% or greater) or occlusion. Left carotid system: No evidence of dissection, stenosis (50% or greater) or occlusion. Vertebral arteries: Left dominant. No evidence of dissection, stenosis (50% or greater) or occlusion. Skeleton: No acute findings.  Multilevel facet arthropathy. Other neck: No acute findings. Upper chest: Visualized lung apices are clear. Review of the MIP images confirms the above findings CTA HEAD FINDINGS Anterior circulation: Bilateral intracranial ICAs, M1 and M2 MCA branches are patent. Possible stenosis versus occlusion of a small left M3 MCA vessel, not well characterized due to small size (for example see series 9, images 135 through 39). Bilateral ACAs are patent. Posterior circulation: Bilateral intradural vertebral arteries basilar artery and PCAs are patent without proximal hemodynamically significant stenosis. Small P1 PCAs with bilateral posterior communicating arteries and small vertebrobasilar system, anatomic variant. Venous sinuses: As permitted by contrast timing, patent. Anatomic variants: Detailed above Review of the MIP images confirms the above findings IMPRESSION: 1. Possible severe stenosis or occlusion of a small left M3 MCA branch not well characterized due to small size. 2. No proximal large  vessel occlusion. Findings discussed with Dr. Jeani Hawking via telephone at 2:43 p.m. Electronically Signed   By: Margaretha Sheffield M.D.   On: 05/12/2022 14:52   CT HEAD CODE STROKE WO CONTRAST  Result Date: 05/12/2022 CLINICAL DATA:  Code  stroke. Neuro deficit, acute, stroke suspected. Last known normal 2 hours ago. Right-sided weakness and right facial droop. EXAM: CT HEAD WITHOUT CONTRAST TECHNIQUE: Contiguous axial images were obtained from the base of the skull through the vertex without intravenous contrast. RADIATION DOSE REDUCTION: This exam was performed according to the departmental dose-optimization program which includes automated exposure control, adjustment of the mA and/or kV according to patient size and/or use of iterative reconstruction technique. COMPARISON:  CT head without contrast 10/21/2020. MR head 12/26/2020 FINDINGS: Brain: No acute infarct, hemorrhage, or mass lesion is present. Basal ganglia are intact. Insular ribbon is normal. No acute or focal cortical abnormality is present. No significant white matter lesions are present. The brainstem and cerebellum are within normal limits. The ventricles are of normal size. No significant extraaxial fluid collection is present. Vascular: No hyperdense vessel or unexpected calcification. Skull: Calvarium is intact. No focal lytic or blastic lesions are present. No significant extracranial soft tissue lesion is present. Sinuses/Orbits: The paranasal sinuses and mastoid air cells are clear. The globes and orbits are within normal limits. ASPECTS [facility] Stroke Program Early CT Score) - Ganglionic level infarction (caudate, lentiform nuclei, internal capsule, insula, M1-M3 cortex): 7/7 - Supraganglionic infarction (M4-M6 cortex): 3/3 Total score (0-10 with 10 being normal): 10/10 IMPRESSION: Negative CT of the head. Electronically Signed   By: San Morelle M.D.   On: 05/12/2022 14:13    Medications: Prior to Admission:  Medications Prior to Admission  Medication Sig Dispense Refill Last Dose   albuterol (PROVENTIL) (2.5 MG/3ML) 0.083% nebulizer solution Inhale 3 mLs (2.5 mg total) into the lungs every 4 (four) hours as needed for wheezing or shortness of breath. (Patient  taking differently: Inhale 2.5 mg into the lungs every 6 (six) hours as needed for wheezing or shortness of breath.) 75 mL 12 prn at prn   albuterol (VENTOLIN HFA) 108 (90 Base) MCG/ACT inhaler Inhale 2-4 puffs by mouth every 4 hours as needed for wheezing, cough, and/or shortness of breath (Patient taking differently: 2 puffs every 6 (six) hours as needed.) 6.7 g 0 prn at prn   ALPRAZolam (XANAX) 1 MG tablet Take 1 mg by mouth 3 (three) times daily as needed for anxiety or sleep.   05/11/2022 at 2000   amLODipine (NORVASC) 5 MG tablet Take 1 tablet (5 mg total) by mouth daily. 90 tablet 1 05/12/2022 at 0730   Asenapine Maleate 10 MG SUBL Place 5 mg under the tongue at bedtime.   05/11/2022 at 2000   aspirin 81 MG EC tablet Take 1 tablet (81 mg total) by mouth daily. Swallow whole. 30 tablet 12 05/11/2022 at 2000   bisoprolol (ZEBETA) 10 MG tablet Take 1 tablet (10 mg total) by mouth daily. OFFICE VISIT NEEDED FOR ADDITIONAL REFILLS 90 tablet 0 05/12/2022 at 0730   buPROPion (WELLBUTRIN XL) 300 MG 24 hr tablet Take 1 tablet (300 mg total) by mouth daily. 30 tablet 0 05/12/2022 at 2000   busPIRone (BUSPAR) 15 MG tablet Take 15 mg by mouth 2 (two) times daily.   05/12/2022 at 0730   docusate sodium (COLACE) 100 MG capsule Take 100 mg by mouth daily.   05/12/2022 at 0730   Dulaglutide 3 MG/0.5ML SOPN Inject 0.5 mLs (3 mg  total) subcutaneously once a week   05/08/2022 at 0730   famotidine (PEPCID) 20 MG tablet Take 20 mg by mouth 2 (two) times daily.   05/12/2022 at 0730   FARXIGA 10 MG TABS tablet Take 10 mg by mouth daily.   05/12/2022 at 0730   fluticasone (FLONASE) 50 MCG/ACT nasal spray Place 1 spray into both nostrils daily. 16 g 2 05/12/2022 at 0730   fluticasone-salmeterol (ADVAIR) 250-50 MCG/ACT AEPB Inhale 1 puff into the lungs 2 (two) times daily.   05/12/2022 at 0730   gabapentin (NEURONTIN) 600 MG tablet Take 600-900 mg by mouth 3 (three) times daily. 600 mg AM, 600 mg pm and 900 mg at bedtime   05/12/2022  at 0730   insulin aspart (NOVOLOG) 100 UNIT/ML FlexPen Inject 20 Units into the skin 3 (three) times daily with meals.   05/12/2022 at 0730   Insulin Glargine Solostar (LANTUS) 100 UNIT/ML Solostar Pen Inject 66 Units subcutaneously at bedtime   Past Week at 2000   LINZESS 290 MCG CAPS capsule Take 1 capsule (290 mcg total) by mouth daily before breakfast. 90 capsule 3 05/12/2022 at 0730   lurasidone (LATUDA) 20 MG TABS tablet Take 20 mg by mouth daily.   05/12/2022 at 0730   metFORMIN (GLUCOPHAGE) 1000 MG tablet Take 1,000 mg by mouth 2 (two) times daily with a meal.   05/12/2022 at 0730   mirabegron ER (MYRBETRIQ) 50 MG TB24 tablet Take 1 tablet (50 mg total) by mouth daily. 30 tablet 11 05/12/2022 at 0730   montelukast (SINGULAIR) 10 MG tablet Take 1 tablet (10 mg total) by mouth daily. 30 tablet 0 05/11/2022 at 2000   omeprazole (PRILOSEC) 40 MG capsule Take 1 capsule (40 mg total) by mouth in the morning and at bedtime. 180 capsule 0 05/12/2022 at 0730   pantoprazole (PROTONIX) 40 MG tablet Take 40 mg by mouth daily.   05/12/2022 at 0730   pregabalin (LYRICA) 100 MG capsule Take 100 mg by mouth 2 (two) times daily.   05/12/2022 at 0730   rosuvastatin (CRESTOR) 10 MG tablet Take 1 tablet (10 mg total) by mouth daily. 90 tablet 0 05/12/2022 at 0730   TRAVATAN Z 0.004 % SOLN ophthalmic solution Place 1 drop into both eyes at bedtime. 5 mL 0 05/11/2022 at [place] test strip Use to check blood sugar up to twice a day as directed 100 strip 3    Accu-Chek Softclix Lancets lancets Use to check blood sugar up to twice a day as directed 100 each 3    GLOBAL EASE INJECT PEN NEEDLES 32G X 4 MM MISC Use as directed with insulin 100 each 0    Insulin Pen Needle (BD PEN NEEDLE NANO U/F) 32G X 4 MM MISC Inject 1 Device into the skin daily. 100 each 3    Insulin Syringe-Needle U-100 (INSULIN SYRINGE 1CC/31GX5/16") 31G X 5/16" 1 ML MISC 1 Syringe by Does not apply route in the morning, at noon, in the  evening, and at bedtime. 200 each 3    meloxicam (MOBIC) 15 MG tablet Take 15 mg by mouth at bedtime. (Patient not taking: Reported on 05/13/2022)   Not Taking at 2000   Olopatadine HCl 0.2 % SOLN Place 1 drop into both eyes daily. 2.5 mL 0 05/11/2022 at 2000   predniSONE (DELTASONE) 5 MG tablet Take 1 tablet (5 mg total) by mouth once daily (Patient not taking: Reported on 05/13/2022)   Not Taking at 0730  Scheduled:  asenapine  5 mg Sublingual QHS   buPROPion  300 mg Oral Daily   busPIRone  15 mg Oral BID   Chlorhexidine Gluconate Cloth  6 each Topical Daily   dapagliflozin propanediol  10 mg Oral Daily   insulin aspart  0-15 Units Subcutaneous TID WC   insulin aspart  0-5 Units Subcutaneous QHS   latanoprost  1 drop Both Eyes QHS   [START ON 05/14/2022] linaclotide  290 mcg Oral QAC breakfast   lurasidone  20 mg Oral QHS   mometasone-formoterol  2 puff Inhalation BID   pantoprazole (PROTONIX) IV  40 mg Intravenous Daily   rosuvastatin  10 mg Oral Daily   Assessment: 70 y.o. female with a PMHx of anxiety, COPD, MI, HTN, glaucoma, sleep apnea and 3 prior strokes with residual left sided weakness, uses a walker/cane to ambulate, who presented as a Code Stroke yesterday after she noted right sided weakness. She was at home alone when at noon she noticed sudden onset of right arm and leg weakness. She waited for her son to come home and at that time tried to ambulate to the bathroom, noticing that her gait was more unsteady than usual. Son then called EMS. She also noted worsened dysarthria. She was administered TNK, was monitored in the ICU for 24 hours and has been transferred to the medical floor.  - Exam reveals improved deficits localizable to the left MCA territory, relative to yesterday.  - CTA of head and neck: Possible severe stenosis or occlusion of a small left M3 MCA branch not well characterized due to small size. No proximal large vessel occlusion. - Home medications include ASA and  rosuvastatin. She is classifiable as having failed ASA monotherapy.   - Repeat CT head: Subtle low density at the apex of a sulcus at the left frontoparietal vertex consistent with the acute infarction better shown by MRI. No evidence of swelling or hemorrhage. Otherwise, generalized brain atrophy.  - TTE: Image acquisition challenging due to COPD. Left ventricular ejection fraction, by estimation, is 65 to 70%. The left ventricle has normal function. Left ventricular endocardial border not optimally defined to evaluate regional wall motion. There is mild left ventricular hypertrophy. Left ventricular diastolic function could not be evaluated.  Right ventricular systolic function is normal. The right ventricular size is not well visualized. The mitral valve is normal in structure. No evidence of mitral valve regurgitation. The aortic valve was not well visualized. Aortic valve regurgitation is not visualized.  - MRI brain: Small acute/subacute infarct the left precentral gyrus. Mild parenchymal volume loss.   Recommendations: - Restart her home ASA and add Plavix. DAPT to be continued indefinitely.   - Continue statin.  - Cardiac telemetry - PT/OT/Speech.  - BP management.  - Will need outpatient Neurology follow up - Neurohospitalist service will sign off. Please call if there are additional questions.    LOS: 1 day   '@Electronically'$  signed: Dr. Kerney Elbe 05/13/2022  9:45 PM

## 2022-05-14 ENCOUNTER — Other Ambulatory Visit: Payer: Self-pay

## 2022-05-14 DIAGNOSIS — I639 Cerebral infarction, unspecified: Secondary | ICD-10-CM | POA: Diagnosis not present

## 2022-05-14 LAB — CBC
HCT: 41.5 % (ref 36.0–46.0)
Hemoglobin: 12.3 g/dL (ref 12.0–15.0)
MCH: 24.8 pg — ABNORMAL LOW (ref 26.0–34.0)
MCHC: 29.6 g/dL — ABNORMAL LOW (ref 30.0–36.0)
MCV: 83.7 fL (ref 80.0–100.0)
Platelets: 182 10*3/uL (ref 150–400)
RBC: 4.96 MIL/uL (ref 3.87–5.11)
RDW: 15.9 % — ABNORMAL HIGH (ref 11.5–15.5)
WBC: 8.7 10*3/uL (ref 4.0–10.5)
nRBC: 0 % (ref 0.0–0.2)

## 2022-05-14 LAB — BASIC METABOLIC PANEL
Anion gap: 9 (ref 5–15)
BUN: 12 mg/dL (ref 8–23)
CO2: 27 mmol/L (ref 22–32)
Calcium: 9.1 mg/dL (ref 8.9–10.3)
Chloride: 107 mmol/L (ref 98–111)
Creatinine, Ser: 1.04 mg/dL — ABNORMAL HIGH (ref 0.44–1.00)
GFR, Estimated: 58 mL/min — ABNORMAL LOW (ref >60)
Glucose, Bld: 154 mg/dL — ABNORMAL HIGH (ref 70–99)
Potassium: 4.3 mmol/L (ref 3.5–5.1)
Sodium: 143 mmol/L (ref 135–145)

## 2022-05-14 LAB — GLUCOSE, CAPILLARY
Glucose-Capillary: 142 mg/dL — ABNORMAL HIGH (ref 70–99)
Glucose-Capillary: 159 mg/dL — ABNORMAL HIGH (ref 70–99)
Glucose-Capillary: 185 mg/dL — ABNORMAL HIGH (ref 70–99)
Glucose-Capillary: 227 mg/dL — ABNORMAL HIGH (ref 70–99)
Glucose-Capillary: 162 mg/dL — ABNORMAL HIGH (ref 70–99)

## 2022-05-14 LAB — HEMOGLOBIN A1C
Hgb A1c MFr Bld: 7.7 % — ABNORMAL HIGH (ref 4.8–5.6)
Mean Plasma Glucose: 174.29 mg/dL

## 2022-05-14 LAB — MAGNESIUM: Magnesium: 2.1 mg/dL (ref 1.7–2.4)

## 2022-05-14 LAB — PHOSPHORUS: Phosphorus: 3.9 mg/dL (ref 2.5–4.6)

## 2022-05-14 MED ORDER — INSULIN ASPART 100 UNIT/ML IJ SOLN
5.0000 [IU] | Freq: Three times a day (TID) | INTRAMUSCULAR | Status: DC
  Administered 2022-05-14 – 2022-05-15 (×2): 5 [IU] via SUBCUTANEOUS

## 2022-05-14 MED ORDER — CLOPIDOGREL BISULFATE 75 MG PO TABS
75.0000 mg | ORAL_TABLET | Freq: Every day | ORAL | Status: DC
  Administered 2022-05-14 – 2022-05-15 (×2): 75 mg via ORAL
  Filled 2022-05-14 (×2): qty 1

## 2022-05-14 MED ORDER — INSULIN DETEMIR 100 UNIT/ML ~~LOC~~ SOLN
10.0000 [IU] | Freq: Every day | SUBCUTANEOUS | Status: DC
  Administered 2022-05-14 – 2022-05-15 (×2): 10 [IU] via SUBCUTANEOUS
  Filled 2022-05-14 (×2): qty 0.1

## 2022-05-14 MED ORDER — PANTOPRAZOLE SODIUM 40 MG PO TBEC
40.0000 mg | DELAYED_RELEASE_TABLET | Freq: Every day | ORAL | Status: DC
Start: 2022-05-15 — End: 2022-05-15
  Administered 2022-05-15: 40 mg via ORAL
  Filled 2022-05-14: qty 1

## 2022-05-14 MED ORDER — ASPIRIN 81 MG PO CHEW
81.0000 mg | CHEWABLE_TABLET | Freq: Every day | ORAL | Status: DC
  Administered 2022-05-14 – 2022-05-15 (×2): 81 mg via ORAL
  Filled 2022-05-14 (×2): qty 1

## 2022-05-14 MED ORDER — ORAL CARE MOUTH RINSE: 15.0000 mL | OROMUCOSAL | Status: DC | PRN

## 2022-05-14 NOTE — Progress Notes (Signed)
PROGRESS NOTE    Rachel Nielsen  YFV:[id number] DOB: 01/27/1951 DOA: 05/12/2022 PCP: Jearld Fenton, NP    Brief Narrative:  71 year old with history of anxiety, COPD, MI, hypertension, glaucoma, sleep apnea and multiple previous stroke with residual left-sided weakness presented to the emergency room with new onset right-sided weakness and treated with thrombolytics and admitted to ICU.  Stabilized and transferred to medical floor today.  Followed by neurology.   Assessment & Plan:   Acute ischemic stroke with history of previous stroke, right MCA territory stroke: Clinical findings, presented with sudden onset right sided weakness, history of left-sided weakness. CT head findings, no acute findings on initial CT scan. MRI of the brain, small acute/subacute infarct of the left precentral gyrus. CT angiogram head and neck: Severe stenosis or occlusion of the left M3 MCA, no large proximal obstruction. 2D echocardiogram, normal ejection fraction.  No evidence of intracardiac thrombus. Antiplatelet therapy, patient on aspirin at home.  Received thrombolysis.  Repeat CT scan normal. As per neurology evaluation, aspirin and Plavix dual antiplatelet therapy indefinitely. LDL 26.  Patient on Crestor.  Continue.  At goal.  New lipid panel was not ordered.  Will order. Hemoglobin A1c, previously 9.  We will recheck A1c. DVT prophylaxis, on SCDs. Therapy recommendations, home health PT OT. Followed by neurology, PT OT.  Type 2 diabetes on insulin: Uncontrolled with hyperglycemia.  Recent A1c more than 9.  A1c not checked in this admission.  Patient currently on high-dose insulin and adjusted as outpatient.  Appetite is normal.  Restart long-acting insulin and prandial insulin.  Recheck A1c level.  Hold metformin.  Essential hypertension: Blood pressure currently stable.     DVT prophylaxis: SCDs Start: 05/12/22 1554   Code Status: Full code Family Communication: None Disposition Plan:  Status is: Inpatient Remains inpatient appropriate because: Immediate post tPA administration, work with PT OT.     Consultants:  Neurology Critical care  Procedures:  None  Antimicrobials:  None   Subjective: Patient seen and examined.  She tells me that her right leg has improved and right hand is gaining strength now.  She feels almost back to baseline except right upper extremity which still feels somehow weak.  Denies any other overnight events.  She wants to do rehab at home with particular home health agencies.  She has done this in the past multiple times.  Objective: Vitals:   05/14/22 0359 05/14/22 0500 05/14/22 0739 05/14/22 1203  BP: 116/76  137/80 134/89  Pulse: 80  90 99  Resp: '16  16 17  '$ Temp: 97.7 F (36.5 C)  98.5 F (36.9 C) 98.6 F (37 C)  TempSrc:      SpO2: 95%  98% 100%  Weight:  98.6 kg    Height:        Intake/Output Summary (Last 24 hours) at 05/14/2022 1425 Last data filed at 05/14/2022 1414 Gross per 24 hour  Intake 850 ml  Output 2100 ml  Net -1250 ml   Filed Weights   05/13/22 0800 05/14/22 0500  Weight: 95.8 kg 98.6 kg    Examination:  General exam: Appears calm and comfortable  Alert oriented. Speech is fluent.  Visual fields are intact. Bilateral lower extremity and left upper extremity with normal power.  Right upper extremity with some weakness mostly in the distal muscle groups. Respiratory system: No added sounds. Cardiovascular system: S1 & S2 heard, No pedal edema. Gastrointestinal system: Soft.  Nontender.  Bowel sound present.   Data Reviewed:  I have personally reviewed following labs and imaging studies  CBC: Recent Labs  Lab 05/12/22 1357 05/13/22 0412 05/14/22 0551  WBC 9.7 8.6 8.7  NEUTROABS 5.4  --   --   HGB 12.5 12.2 12.3  HCT 42.5 41.7 41.5  MCV 84.5 84.4 83.7  PLT 208 186 161   Basic Metabolic Panel: Recent Labs  Lab 05/12/22 1357 05/12/22 1429 05/13/22 0412 05/14/22 0551  NA 140  --  140  143  K 3.8  --  4.2 4.3  CL 109  --  107 107  CO2 25  --  26 27  GLUCOSE 127*  --  222* 154*  BUN 17  --  14 12  CREATININE 0.95 1.00 1.01*  1.10* 1.04*  CALCIUM 8.9  --  8.7* 9.1  MG  --   --  2.0 2.1  PHOS  --   --  3.3 3.9   GFR: Estimated Creatinine Clearance: 58.3 mL/min (A) (by C-G formula based on SCr of 1.04 mg/dL (H)). Liver Function Tests: Recent Labs  Lab 05/12/22 1357  AST 21  ALT 19  ALKPHOS 70  BILITOT 0.5  PROT 6.9  ALBUMIN 3.6   No results for input(s): "LIPASE", "AMYLASE" in the last 168 hours. No results for input(s): "AMMONIA" in the last 168 hours. Coagulation Profile: Recent Labs  Lab 05/12/22 1357  INR 1.0   Cardiac Enzymes: No results for input(s): "CKTOTAL", "CKMB", "CKMBINDEX", "TROPONINI" in the last 168 hours. BNP (last 3 results) No results for input(s): "PROBNP" in the last 8760 hours. HbA1C: No results for input(s): "HGBA1C" in the last 72 hours. CBG: Recent Labs  Lab 05/13/22 0844 05/13/22 1807 05/13/22 2022 05/14/22 0740 05/14/22 1204  GLUCAP 142* 162* 232* 159* 185*   Lipid Profile: No results for input(s): "CHOL", "HDL", "LDLCALC", "TRIG", "CHOLHDL", "LDLDIRECT" in the last 72 hours. Thyroid Function Tests: No results for input(s): "TSH", "T4TOTAL", "FREET4", "T3FREE", "THYROIDAB" in the last 72 hours. Anemia Panel: No results for input(s): "VITAMINB12", "FOLATE", "FERRITIN", "TIBC", "IRON", "RETICCTPCT" in the last 72 hours. Sepsis Labs: No results for input(s): "PROCALCITON", "LATICACIDVEN" in the last 168 hours.  Recent Results (from the past 240 hour(s))  MRSA Next Gen by PCR, Nasal     Status: None   Collection Time: 05/13/22  9:03 AM   Specimen: Nasal Mucosa; Nasal Swab  Result Value Ref Range Status   MRSA by PCR Next Gen NOT DETECTED NOT DETECTED Final    Comment: (NOTE) The GeneXpert MRSA Assay (FDA approved for NASAL specimens only), is one component of a comprehensive MRSA colonization  surveillance program. It is not intended to diagnose MRSA infection nor to guide or monitor treatment for MRSA infections. Test performance is not FDA approved in patients less than 44 years old. Performed at [facility], [address] [place], [place] [address]   MRSA Next Gen by PCR, Nasal     Status: None   Collection Time: 05/13/22  6:35 PM   Specimen: Nasal Mucosa; Nasal Swab  Result Value Ref Range Status   MRSA by PCR Next Gen NOT DETECTED NOT DETECTED Final    Comment: (NOTE) The GeneXpert MRSA Assay (FDA approved for NASAL specimens only), is one component of a comprehensive MRSA colonization surveillance program. It is not intended to diagnose MRSA infection nor to guide or monitor treatment for MRSA infections. Test performance is not FDA approved in patients less than 73 years old. Performed at [facility], [address],  [place], [place] [address]          Radiology Studies: CT HEAD WO CONTRAST (5MM)  Result Date: 05/13/2022 CLINICAL DATA:  Stroke.  Follow-up after T NK. EXAM: CT HEAD WITHOUT CONTRAST TECHNIQUE: Contiguous axial images were obtained from the base of the skull through the vertex without intravenous contrast. RADIATION DOSE REDUCTION: This exam was performed according to the departmental dose-optimization program which includes automated exposure control, adjustment of the mA and/or kV according to patient size and/or use of iterative reconstruction technique. COMPARISON:  MRI same day. FINDINGS: Brain: Mild age related atrophy. Subtle evidence of the acute infarction at the apex of a sulcus at the frontoparietal vertex. No evidence of hemorrhage or swelling. No other focal insult. No hydrocephalus or extra-axial collection. Vascular: There is atherosclerotic calcification of the major vessels at the base of the brain. Skull: Negative Sinuses/Orbits: Clear/normal Other: None IMPRESSION: Subtle low density at the apex of a sulcus at  the left frontoparietal vertex consistent with the acute infarction better shown by MRI. No evidence of swelling or hemorrhage. Otherwise, generalized brain atrophy. Electronically Signed   By: Nelson Chimes M.D.   On: 05/13/2022 15:29   ECHOCARDIOGRAM COMPLETE  Result Date: 05/13/2022    ECHOCARDIOGRAM REPORT   Patient Name:   JEANMARIE MCCOWEN Date of Exam: 05/13/2022 Medical Rec #:  [id number]     Height:       64.0 in Accession #:    [id number]    Weight:       220.0 lb Date of Birth:  08-12-1951     BSA:          2.037 m Patient Age:    15 years      BP:           135/80 mmHg Patient Gender: F             HR:           78 bpm. Exam Location:  [facility] Procedure: 2D Echo, Cardiac Doppler and Color Doppler Indications:     Stroke I63.9  History:         Patient has no prior history of Echocardiogram examinations.                  COPD; Risk Factors:Hypertension.  Sonographer:     [name] Referring Phys:  [id number] Bradly Bienenstock Diagnosing Phys: Kate Sable MD  Sonographer Comments: Technically challenging study due to limited acoustic windows, no apical window and no subcostal window. Image acquisition challenging due to COPD. IMPRESSIONS  1. Left ventricular ejection fraction, by estimation, is 65 to 70%. The left ventricle has normal function. Left ventricular endocardial border not optimally defined to evaluate regional wall motion. There is mild left ventricular hypertrophy. Left ventricular diastolic function could not be evaluated.  2. Right ventricular systolic function is normal. The right ventricular size is not well visualized.  3. The mitral valve is normal in structure. No evidence of mitral valve regurgitation.  4. The aortic valve was not well visualized. Aortic valve regurgitation is not visualized. FINDINGS  Left Ventricle: Left ventricular ejection fraction, by estimation, is 65 to 70%. The left ventricle has normal function. Left ventricular endocardial border not optimally defined to  evaluate regional wall motion. The left ventricular internal cavity size was normal in size. There is mild left ventricular hypertrophy. Left ventricular diastolic function could not be evaluated. Right Ventricle: The right ventricular size is not well visualized. No increase in right  ventricular wall thickness. Right ventricular systolic function is normal. Left Atrium: Left atrial size was normal in size. Right Atrium: Right atrial size was not well visualized. Pericardium: There is no evidence of pericardial effusion. Mitral Valve: The mitral valve is normal in structure. No evidence of mitral valve regurgitation. Tricuspid Valve: The tricuspid valve is normal in structure. Tricuspid valve regurgitation is trivial. Aortic Valve: The aortic valve was not well visualized. Aortic valve regurgitation is not visualized. Pulmonic Valve: The pulmonic valve was not well visualized. Pulmonic valve regurgitation is not visualized. Aorta: The aortic root is normal in size and structure. Venous: The inferior vena cava was not well visualized. IAS/Shunts: The interatrial septum was not well visualized.  LEFT VENTRICLE PLAX 2D LVIDd:         3.60 cm LVIDs:         2.40 cm LV PW:         1.10 cm LV IVS:        1.60 cm LVOT diam:     1.90 cm LVOT Area:     2.84 cm  LEFT ATRIUM         Index LA diam:    3.20 cm 1.57 cm/m   AORTA Ao Root diam: 3.10 cm TRICUSPID VALVE TR Peak grad:   15.2 mmHg TR Vmax:        195.00 cm/s  SHUNTS Systemic Diam: 1.90 cm Kate Sable MD Electronically signed by Kate Sable MD Signature Date/Time: 05/13/2022/2:39:53 PM    Final    MR BRAIN WO CONTRAST  Result Date: 05/13/2022 CLINICAL DATA:  Stroke follow-up. EXAM: MRI HEAD WITHOUT CONTRAST TECHNIQUE: Multiplanar, multiecho pulse sequences of the brain and surrounding structures were obtained without intravenous contrast. COMPARISON:  Head CT May 12, 2022. MRI of the brain December 24, 2020 FINDINGS: Brain: Area of restricted diffusion  in the left precentral gyrus measuring approximately 15 mm, consistent with acute/subacute infarct. No hemorrhage, hydrocephalus, extra-axial collection or mass lesion. No significant white matter disease. Mild parenchymal volume loss, more evident in the bilateral high convexity. Partially empty sella. Vascular: Normal flow voids. Skull and upper cervical spine: Normal marrow signal. Sinuses/Orbits: Negative. Other: None. IMPRESSION: 1. Small acute/subacute infarct the left precentral gyrus. 2. Mild parenchymal volume loss. Electronically Signed   By: Pedro Earls M.D.   On: 05/13/2022 11:10   DG Chest Port 1 View  Result Date: 05/13/2022 CLINICAL DATA:  71 year old female with history of COPD. EXAM: PORTABLE CHEST 1 VIEW COMPARISON:  Chest x-ray 12/21/2021. FINDINGS: Lung volumes are low. Elevation of the right hemidiaphragm, unchanged. No consolidative airspace disease. No pleural effusions. No pneumothorax. No pulmonary nodule or mass noted. Pulmonary vasculature and the cardiomediastinal silhouette are within normal limits. IMPRESSION: 1. Low lung volumes without radiographic evidence of acute cardiopulmonary disease. 2. Persistent elevation of the right hemidiaphragm. Electronically Signed   By: Vinnie Langton M.D.   On: 05/13/2022 05:56   CT ANGIO HEAD NECK W WO CM (CODE STROKE)  Result Date: 05/12/2022 CLINICAL DATA:  Neuro deficit, acute, stroke suspected EXAM: CT ANGIOGRAPHY HEAD AND NECK TECHNIQUE: Multidetector CT imaging of the head and neck was performed using the standard protocol during bolus administration of intravenous contrast. Multiplanar CT image reconstructions and MIPs were obtained to evaluate the vascular anatomy. Carotid stenosis measurements (when applicable) are obtained utilizing NASCET criteria, using the distal internal carotid diameter as the denominator. RADIATION DOSE REDUCTION: This exam was performed according to the departmental dose-optimization  program which includes  automated exposure control, adjustment of the mA and/or kV according to patient size and/or use of iterative reconstruction technique. CONTRAST:  86m OMNIPAQUE IOHEXOL 350 MG/ML SOLN COMPARISON:  None Available. FINDINGS: CTA NECK FINDINGS Aortic arch: Great vessel origins are patent. Right carotid system: No evidence of dissection, stenosis (50% or greater) or occlusion. Left carotid system: No evidence of dissection, stenosis (50% or greater) or occlusion. Vertebral arteries: Left dominant. No evidence of dissection, stenosis (50% or greater) or occlusion. Skeleton: No acute findings.  Multilevel facet arthropathy. Other neck: No acute findings. Upper chest: Visualized lung apices are clear. Review of the MIP images confirms the above findings CTA HEAD FINDINGS Anterior circulation: Bilateral intracranial ICAs, M1 and M2 MCA branches are patent. Possible stenosis versus occlusion of a small left M3 MCA vessel, not well characterized due to small size (for example see series 9, images 135 through 39). Bilateral ACAs are patent. Posterior circulation: Bilateral intradural vertebral arteries basilar artery and PCAs are patent without proximal hemodynamically significant stenosis. Small P1 PCAs with bilateral posterior communicating arteries and small vertebrobasilar system, anatomic variant. Venous sinuses: As permitted by contrast timing, patent. Anatomic variants: Detailed above Review of the MIP images confirms the above findings IMPRESSION: 1. Possible severe stenosis or occlusion of a small left M3 MCA branch not well characterized due to small size. 2. No proximal large vessel occlusion. Findings discussed with Dr. LJeani Hawkingvia telephone at 2:43 p.m. Electronically Signed   By: FMargaretha SheffieldM.D.   On: 05/12/2022 14:52        Scheduled Meds:  asenapine  5 mg Sublingual QHS   aspirin  81 mg Oral Daily   buPROPion  300 mg Oral Daily   busPIRone  15 mg Oral BID   Chlorhexidine  Gluconate Cloth  6 each Topical Daily   clopidogrel  75 mg Oral Daily   dapagliflozin propanediol  10 mg Oral Daily   insulin aspart  0-15 Units Subcutaneous TID WC   insulin aspart  0-5 Units Subcutaneous QHS   insulin aspart  5 Units Subcutaneous TID WC   insulin detemir  10 Units Subcutaneous Daily   latanoprost  1 drop Both Eyes QHS   linaclotide  290 mcg Oral QAC breakfast   lurasidone  20 mg Oral QHS   mometasone-formoterol  2 puff Inhalation BID   [START ON 05/15/2022] pantoprazole  40 mg Oral Daily   rosuvastatin  10 mg Oral Daily   Continuous Infusions:   LOS: 2 days    Time spent: 35 minutes    KBarb Merino MD [facility] Pager [phone]

## 2022-05-14 NOTE — Plan of Care (Signed)

## 2022-05-14 NOTE — Evaluation (Signed)
Occupational Therapy Evaluation Patient Details Name: Rachel Nielsen MRN: [id number] DOB: 06/06/1951 Today's Date: 05/14/2022   History of Present Illness Rachel Nielsen is a 71 y.o. female with a past history of diabetes, hypertension, prior strokes who comes the ED complaining of right-sided weakness that started at 12:00. 05/13/22 Brain MRI: Small acute/subacute infarct the left precentral gyrus. TNK administered.   Clinical Impression   Rachel Nielsen was seen for OT evaluation this date. Prior to hospital admission, pt was MOD I for limited mobility using RW or SPC. Pt lives with family. Pt presents to acute OT demonstrating impaired ADL performance and functional mobility 2/2 decreased activity tolerance and impaired functional use of dominant RUE. Pt currently requires SETUP self-feeding in sitting. SBA don/doff R sock seated EOC. CGA + HHA for simulated BSC t/f.   Pt given green theraputty for grip strengthening exercises, and completed coordination exercise removing tissue from box, crumbling in hand, and throwing across room - reviewed HEP. Pt would benefit from skilled OT to address noted impairments and functional limitations (see below for any additional details). Upon hospital discharge, recommend HHOT to maximize pt safety and return to PLOF.    Recommendations for follow up therapy are one component of a multi-disciplinary discharge planning process, led by the attending physician.  Recommendations may be updated based on patient status, additional functional criteria and insurance authorization.   Follow Up Recommendations  Home health OT    Assistance Recommended at Discharge Set up Supervision/Assistance  Patient can return home with the following A little help with walking and/or transfers;A little help with bathing/dressing/bathroom    Functional Status Assessment  Patient has had a recent decline in their functional status and demonstrates the ability to make significant  improvements in function in a reasonable and predictable amount of time.  Equipment Recommendations  BSC/3in1    Recommendations for Other Services       Precautions / Restrictions Precautions Precautions: Fall Restrictions Weight Bearing Restrictions: No      Mobility Bed Mobility               General bed mobility comments: received and lef tin chair    Transfers Overall transfer level: Needs assistance Equipment used: 1 person hand held assist Transfers: Sit to/from Stand Sit to Stand: Min guard                  Balance Overall balance assessment: Needs assistance Sitting-balance support: No upper extremity supported, Feet supported Sitting balance-Leahy Scale: Good     Standing balance support: No upper extremity supported, During functional activity Standing balance-Leahy Scale: Good                             ADL either performed or assessed with clinical judgement   ADL Overall ADL's : Needs assistance/impaired                                       General ADL Comments: SETUP self-feeding in sitting. SBA don/doff R sock seated EOC. CGA + HHA for simulated BSC t/f.      Pertinent Vitals/Pain Pain Assessment Pain Assessment: No/denies pain     Hand Dominance Right   Extremity/Trunk Assessment Upper Extremity Assessment Upper Extremity Assessment: RUE deficits/detail RUE Deficits / Details: Achieves 5 digit opposition with increased time. Grip 3/5. Shoulder flexion 3+/5. Elbow  flexion/extension 3/5. Endorses tingling along R forearm RUE Sensation: decreased light touch   Lower Extremity Assessment Lower Extremity Assessment: Generalized weakness       Communication Communication Communication: No difficulties   Cognition Arousal/Alertness: Awake/alert Behavior During Therapy: WFL for tasks assessed/performed Overall Cognitive Status: Within Functional Limits for tasks assessed                                        General Comments  HR 115 in standing            Home Living Family/patient expects to be discharged to:: Private residence Living Arrangements: Children Available Help at Discharge: Family   Home Access: Stairs to enter Technical brewer of Steps: 2   Home Layout: One level     Bathroom Shower/Tub: Tub/shower unit         Home Equipment: Conservation officer, nature (2 wheels);Cane - single point;Tub bench          Prior Functioning/Environment Prior Level of Function : Needs assist             Mobility Comments: Pt is a limited community ambulator and has her son do the grocery shopping, uses SPC to get down the stairs, uses RW in the house          OT Problem List: Decreased range of motion;Decreased strength;Decreased activity tolerance;Impaired balance (sitting and/or standing);Decreased coordination      OT Treatment/Interventions: Self-care/ADL training;Therapeutic exercise;Energy conservation;DME and/or AE instruction;Therapeutic activities;Patient/family education;Balance training    OT Goals(Current goals can be found in the care plan section) Acute Rehab OT Goals Patient Stated Goal: to go home OT Goal Formulation: With patient Time For Goal Achievement: 05/28/22 Potential to Achieve Goals: Good ADL Goals Pt Will Perform Grooming: with modified independence;standing Pt Will Perform Lower Body Dressing: sit to/from stand;Independently Pt Will Transfer to Toilet: ambulating;regular height toilet;Independently  OT Frequency: Min 3X/week    Co-evaluation              AM-PAC OT "6 Clicks" Daily Activity     Outcome Measure Help from another person eating meals?: None Help from another person taking care of personal grooming?: A Little Help from another person toileting, which includes using toliet, bedpan, or urinal?: A Little Help from another person bathing (including washing, rinsing, drying)?: A Little Help from  another person to put on and taking off regular upper body clothing?: A Little Help from another person to put on and taking off regular lower body clothing?: None 6 Click Score: 20   End of Session    Activity Tolerance: Patient tolerated treatment well Patient left: in chair;with call bell/phone within reach;with chair alarm set  OT Visit Diagnosis: Other abnormalities of gait and mobility (R26.89);Hemiplegia and hemiparesis;Muscle weakness (generalized) (M62.81) Hemiplegia - Right/Left: Right Hemiplegia - dominant/non-dominant: Dominant Hemiplegia - caused by: Cerebral infarction                Time: 9379-0240 OT Time Calculation (min): 18 min Charges:  OT General Charges $OT Visit: 1 Visit OT Evaluation $OT Eval Moderate Complexity: 1 Mod OT Treatments $Therapeutic Exercise: 8-22 mins  Dessie Coma, M.S. OTR/L  05/14/22, 11:26 AM  ascom [phone]

## 2022-05-14 NOTE — Evaluation (Signed)
Physical Therapy Evaluation Patient Details Name: Rachel Nielsen MRN: [id number] DOB: Dec 04, 1950 Today's Date: 05/14/2022  History of Present Illness  Patient is a 71 year old female with history of anxiety, COPD, MI, HTN, glaucoma, sleep apnea and 3 prior strokes with residual left sided weakness with new onest of right side weakness. TNK administered. MRI of brain reports small acute/subacute infarct the left precentral gyrus  Clinical Impression  Patient is agreeable to PT evaluation. She is eager to return home with support from her son. She ambulates with a cane in the community and a rolling walker in the home at baseline.  The patient has mild RUE weakness noted today. She is able to negotiate using the rolling walker with the RUE and ambulate without physical assistance. Occasional cues for safety with mobility. Recommend to continue PT to maximize independence. Recommend home health PT and patient is requesting [facility]. She has DME at home.      Recommendations for follow up therapy are one component of a multi-disciplinary discharge planning process, led by the attending physician.  Recommendations may be updated based on patient status, additional functional criteria and insurance authorization.  Follow Up Recommendations Home health PT (she is requesting [facility])      Assistance Recommended at Discharge Set up Supervision/Assistance  Patient can return home with the following  Help with stairs or ramp for entrance;Assist for transportation    Equipment Recommendations None recommended by PT  Recommendations for Other Services       Functional Status Assessment Patient has had a recent decline in their functional status and demonstrates the ability to make significant improvements in function in a reasonable and predictable amount of time.     Precautions / Restrictions Precautions Precautions: Fall Restrictions Weight Bearing Restrictions: No      Mobility  Bed  Mobility Overal bed mobility: Needs Assistance Bed Mobility: Supine to Sit     Supine to sit: Supervision     General bed mobility comments: increased time and effort required    Transfers Overall transfer level: Needs assistance Equipment used: Rolling walker (2 wheels) Transfers: Sit to/from Stand Sit to Stand: Min guard           General transfer comment: verbal cues for hand placement for safety    Ambulation/Gait Ambulation/Gait assistance: Min guard, Supervision Gait Distance (Feet): 45 Feet Assistive device: Rolling walker (2 wheels) Gait Pattern/deviations: Step-through pattern, Decreased stride length       General Gait Details: Min guard initially progressing to supervision. no loss of balance or knee buckling noted with ambulation. good safety awareness with use of rolling walker and recommended to use for safety with ambulation at this time  Stairs            Wheelchair Mobility    Modified Rankin (Stroke Patients Only)       Balance Overall balance assessment: Needs assistance Sitting-balance support: No upper extremity supported, Feet supported Sitting balance-Leahy Scale: Good     Standing balance support: No upper extremity supported, During functional activity Standing balance-Leahy Scale: Fair Standing balance comment: with RW for support                             Pertinent Vitals/Pain Pain Assessment Pain Assessment: No/denies pain    Home Living Family/patient expects to be discharged to:: Private residence Living Arrangements: Children Available Help at Discharge: Family   Home Access: Stairs to enter  Entrance Stairs-Number of Steps: 2   Home Layout: One level Home Equipment: Conservation officer, nature (2 wheels);Cane - single point;Tub bench      Prior Function Prior Level of Function : Needs assist             Mobility Comments: Pt is a limited community ambulator and has her son do the grocery shopping,  uses SPC to get down the stairs, uses RW in the house ADLs Comments: independent     Hand Dominance   Dominant Hand: Right    Extremity/Trunk Assessment   Upper Extremity Assessment Upper Extremity Assessment:  (RUE weakness noted. see OT note for details) RUE Deficits / Details: Achieves 5 digit opposition with increased time. Grip 3/5. Shoulder flexion 3+/5. Elbow flexion/extension 3/5. Endorses tingling along R forearm RUE Sensation: decreased light touch    Lower Extremity Assessment Lower Extremity Assessment: RLE deficits/detail;LLE deficits/detail RLE Deficits / Details: 5/5 hip add/abd, knee extension 4+/5, dorsiflexion/plantarflexion 5/5 RLE Sensation: WNL LLE Deficits / Details: 5/5 hip add/abd, knee extension 4+/5, dorsiflexion/plantarflexion 5/5 LLE Sensation: WNL       Communication   Communication: No difficulties  Cognition Arousal/Alertness: Awake/alert Behavior During Therapy: WFL for tasks assessed/performed Overall Cognitive Status: Within Functional Limits for tasks assessed                                          General Comments General comments (skin integrity, edema, etc.): HR 115 in standing    Exercises     Assessment/Plan    PT Assessment Patient needs continued PT services  PT Problem List Decreased strength;Decreased balance;Decreased activity tolerance;Decreased mobility;Decreased range of motion;Decreased safety awareness       PT Treatment Interventions DME instruction;Gait training;Stair training;Therapeutic activities;Functional mobility training;Therapeutic exercise;Balance training;Neuromuscular re-education;Patient/family education;Cognitive remediation    PT Goals (Current goals can be found in the Care Plan section)  Acute Rehab PT Goals Patient Stated Goal: to return home with [facility] PT Goal Formulation: With patient Time For Goal Achievement: 05/28/22 Potential to Achieve Goals: Good    Frequency Min  2X/week     Co-evaluation               AM-PAC PT "6 Clicks" Mobility  Outcome Measure Help needed turning from your back to your side while in a flat bed without using bedrails?: None Help needed moving from lying on your back to sitting on the side of a flat bed without using bedrails?: A Little Help needed moving to and from a bed to a chair (including a wheelchair)?: A Little Help needed standing up from a chair using your arms (e.g., wheelchair or bedside chair)?: A Little Help needed to walk in hospital room?: A Little Help needed climbing 3-5 steps with a railing? : A Little 6 Click Score: 19    End of Session Equipment Utilized During Treatment: Gait belt Activity Tolerance: Patient tolerated treatment well Patient left: in chair;with call bell/phone within reach;with chair alarm set Nurse Communication: Mobility status PT Visit Diagnosis: Unsteadiness on feet (R26.81);Muscle weakness (generalized) (M62.81)    Time: 5465-0354 PT Time Calculation (min) (ACUTE ONLY): 34 min   Charges:   PT Evaluation $PT Eval Low Complexity: 1 Low PT Treatments $Gait Training: 8-22 mins        Minna Merritts, PT, MPT   Percell Locus 05/14/2022, 12:29 PM

## 2022-05-14 NOTE — Progress Notes (Signed)

## 2022-05-14 NOTE — TOC Initial Note (Signed)
Transition of Care [facility]) - Initial/Assessment Note    Patient Details  Name: Rachel Nielsen MRN: [id number] Date of Birth: 1951/04/18  [facility] CM/SW Contact:    Pete Pelt, RN Phone Number: 05/14/2022, 4:27 PM  Clinical Narrative:       Patient lives at home with adult son, who she states can assist her at home.  Her son transports her to appointments.  Her medications are on auto refill and she has no issues with compliance, she has reminders to assure compliance along with her son's assistance.  Patient states she has her own home health and would ike to call them after discharge.  PT notes state patient prefers [facility].  Patient asked that [facility] return, as she asked nurse to receive care at this time.                  Patient Goals and CMS Choice        Expected Discharge Plan and Services     Discharge Planning Services: CM Consult   Living arrangements for the past 2 months: Single Family Home                           HH Arranged: PT, OT [place] Agency:  (see notes,)        Prior Living Arrangements/Services Living arrangements for the past 2 months: Single Family Home Lives with:: Self, Adult Children Patient language and need for interpreter reviewed:: Yes Do you feel safe going back to the place where you live?: Yes      Need for Family Participation in Patient Care: Yes (Comment) Care giver support system in place?: Yes (comment)   Criminal Activity/Legal Involvement Pertinent to Current Situation/Hospitalization: No - Comment as needed  Activities of Daily Living Home Assistive Devices/Equipment: Walker (specify type), Bedside commode/3-in-1, Shower chair with back ADL Screening (condition at time of admission) Patient's cognitive ability adequate to safely complete daily activities?: Yes Is the patient deaf or have difficulty hearing?: No Does the patient have difficulty seeing, even when wearing glasses/contacts?: No Does the  patient have difficulty concentrating, remembering, or making decisions?: No Patient able to express need for assistance with ADLs?: Yes Does the patient have difficulty dressing or bathing?: No Independently performs ADLs?: Yes (appropriate for developmental age) Does the patient have difficulty walking or climbing stairs?: Yes Weakness of Legs: Right Weakness of Arms/Hands: Right  Permission Sought/Granted Permission sought to share information with : Case Manager Permission granted to share information with : Yes, Verbal Permission Granted              Emotional Assessment Appearance:: Appears stated age Attitude/Demeanor/Rapport: Gracious Affect (typically observed): Appropriate Orientation: : Oriented to Self, Oriented to Place, Oriented to  Time, Oriented to Situation Alcohol / Substance Use: Not Applicable Psych Involvement: No (comment)  Admission diagnosis:  Acute ischemic stroke ([place]) [I63.9] Acute CVA (cerebrovascular accident) [facility]) [I63.9] Patient Active Problem List   Diagnosis Date Noted   Acute CVA (cerebrovascular accident) ([place]) 05/12/2022   Mixed diabetic hyperlipidemia associated with type 2 diabetes mellitus ([place]) 07/09/2021   Chronic constipation 07/09/2021   Cervical radiculopathy 07/09/2021   Lumbar radiculopathy 07/09/2021   Class 2 obesity due to excess calories with body mass index (BMI) of 37.0 to 37.9 in adult 07/09/2021   OSA (obstructive sleep apnea) 12/18/2020   Diabetic neuropathy ([place]) 12/17/2020   Centrilobular emphysema ([place]) 12/17/2020   GAD (generalized anxiety disorder)  12/17/2020   History of stroke 10/21/2020   Uncontrolled type 2 diabetes mellitus with hyperglycemia ([place]) 03/14/2019   Hypertension 12/21/2017   GERD (gastroesophageal reflux disease) 12/21/2017   Major depressive disorder, recurrent, moderate ([place]) 12/21/2017   PCP:  Jearld Fenton, NP Pharmacy:   [facility], [address] [place] [address] Phone: [phone] Fax: [phone]  [facility] [place], [address] [place] [address] Phone: [phone] Fax: [phone]     Social Determinants of Health (SDOH) Interventions    Readmission Risk Interventions     No data to display

## 2022-05-15 ENCOUNTER — Telehealth: Payer: Self-pay

## 2022-05-15 LAB — LIPID PANEL
Cholesterol: 68 mg/dL (ref 0–200)
HDL: 32 mg/dL — ABNORMAL LOW (ref >40)
LDL Cholesterol: 24 mg/dL (ref 0–99)
Total CHOL/HDL Ratio: 2.1 RATIO
Triglycerides: 59 mg/dL (ref <150)
VLDL: 12 mg/dL (ref 0–40)

## 2022-05-15 LAB — GLUCOSE, CAPILLARY: Glucose-Capillary: 184 mg/dL — ABNORMAL HIGH (ref 70–99)

## 2022-05-15 MED ORDER — CLOPIDOGREL BISULFATE 75 MG PO TABS: 75.0000 mg | ORAL_TABLET | Freq: Every day | ORAL | 2 refills | Status: AC

## 2022-05-15 NOTE — Care Management Important Message (Signed)
Important Message  Patient Details  Name: Rachel Nielsen MRN: [id number] Date of Birth: Oct 09, 1951   Medicare Important Message Given:  N/A - LOS <3 / Initial given by admissions     Juliann Pulse A Jendaya Gossett 05/15/2022, 9:07 AM

## 2022-05-15 NOTE — Plan of Care (Signed)
Problem: Education: Goal: Knowledge of General Education information will improve Description: Including pain rating scale, medication(s)/side effects and non-pharmacologic comfort measures 05/15/2022 1050 by Mancel Bale, RN Outcome: Adequate for Discharge 05/15/2022 0757 by Mancel Bale, RN Outcome: Progressing   Problem: Health Behavior/Discharge Planning: Goal: Ability to manage health-related needs will improve 05/15/2022 1050 by Mancel Bale, RN Outcome: Adequate for Discharge 05/15/2022 0757 by Mancel Bale, RN Outcome: Progressing   Problem: Clinical Measurements: Goal: Ability to maintain clinical measurements within normal limits will improve 05/15/2022 1050 by Mancel Bale, RN Outcome: Adequate for Discharge 05/15/2022 0757 by Mancel Bale, RN Outcome: Progressing Goal: Will remain free from infection 05/15/2022 1050 by Mancel Bale, RN Outcome: Adequate for Discharge 05/15/2022 0757 by Mancel Bale, RN Outcome: Progressing Goal: Diagnostic test results will improve 05/15/2022 1050 by Mancel Bale, RN Outcome: Adequate for Discharge 05/15/2022 0757 by Mancel Bale, RN Outcome: Progressing Goal: Respiratory complications will improve 05/15/2022 1050 by Mancel Bale, RN Outcome: Adequate for Discharge 05/15/2022 0757 by Mancel Bale, RN Outcome: Progressing Goal: Cardiovascular complication will be avoided 05/15/2022 1050 by Mancel Bale, RN Outcome: Adequate for Discharge 05/15/2022 0757 by Mancel Bale, RN Outcome: Progressing   Problem: Activity: Goal: Risk for activity intolerance will decrease 05/15/2022 1050 by Mancel Bale, RN Outcome: Adequate for Discharge 05/15/2022 0757 by Mancel Bale, RN Outcome: Progressing   Problem: Nutrition: Goal: Adequate nutrition will be maintained 05/15/2022 1050 by Mancel Bale, RN Outcome: Adequate for Discharge 05/15/2022 0757 by Mancel Bale, RN Outcome:  Progressing   Problem: Coping: Goal: Level of anxiety will decrease 05/15/2022 1050 by Mancel Bale, RN Outcome: Adequate for Discharge 05/15/2022 0757 by Mancel Bale, RN Outcome: Progressing   Problem: Elimination: Goal: Will not experience complications related to bowel motility 05/15/2022 1050 by Mancel Bale, RN Outcome: Adequate for Discharge 05/15/2022 0757 by Mancel Bale, RN Outcome: Progressing Goal: Will not experience complications related to urinary retention 05/15/2022 1050 by Mancel Bale, RN Outcome: Adequate for Discharge 05/15/2022 0757 by Mancel Bale, RN Outcome: Progressing   Problem: Pain Managment: Goal: General experience of comfort will improve 05/15/2022 1050 by Mancel Bale, RN Outcome: Adequate for Discharge 05/15/2022 0757 by Mancel Bale, RN Outcome: Progressing   Problem: Safety: Goal: Ability to remain free from injury will improve 05/15/2022 1050 by Mancel Bale, RN Outcome: Adequate for Discharge 05/15/2022 0757 by Mancel Bale, RN Outcome: Progressing   Problem: Skin Integrity: Goal: Risk for impaired skin integrity will decrease 05/15/2022 1050 by Mancel Bale, RN Outcome: Adequate for Discharge 05/15/2022 0757 by Mancel Bale, RN Outcome: Progressing   Problem: Education: Goal: Ability to describe self-care measures that may prevent or decrease complications (Diabetes Survival Skills Education) will improve 05/15/2022 1050 by Mancel Bale, RN Outcome: Adequate for Discharge 05/15/2022 0757 by Mancel Bale, RN Outcome: Progressing Goal: Individualized Educational Video(s) 05/15/2022 1050 by Mancel Bale, RN Outcome: Adequate for Discharge 05/15/2022 0757 by Mancel Bale, RN Outcome: Progressing   Problem: Coping: Goal: Ability to adjust to condition or change in health will improve 05/15/2022 1050 by Mancel Bale, RN Outcome: Adequate for Discharge 05/15/2022 0757 by Mancel Bale, RN Outcome: Progressing   Problem: Fluid Volume: Goal: Ability to maintain a balanced intake and output will improve 05/15/2022 1050 by Mancel Bale, RN Outcome: Adequate for Discharge 05/15/2022 0757 by Isa Rankin,  Rebecka Apley, RN Outcome: Progressing   Problem: Health Behavior/Discharge Planning: Goal: Ability to identify and utilize available resources and services will improve 05/15/2022 1050 by Mancel Bale, RN Outcome: Adequate for Discharge 05/15/2022 0757 by Mancel Bale, RN Outcome: Progressing Goal: Ability to manage health-related needs will improve 05/15/2022 1050 by Mancel Bale, RN Outcome: Adequate for Discharge 05/15/2022 0757 by Mancel Bale, RN Outcome: Progressing   Problem: Metabolic: Goal: Ability to maintain appropriate glucose levels will improve 05/15/2022 1050 by Mancel Bale, RN Outcome: Adequate for Discharge 05/15/2022 0757 by Mancel Bale, RN Outcome: Progressing   Problem: Nutritional: Goal: Maintenance of adequate nutrition will improve 05/15/2022 1050 by Mancel Bale, RN Outcome: Adequate for Discharge 05/15/2022 0757 by Mancel Bale, RN Outcome: Progressing Goal: Progress toward achieving an optimal weight will improve 05/15/2022 1050 by Mancel Bale, RN Outcome: Adequate for Discharge 05/15/2022 0757 by Mancel Bale, RN Outcome: Progressing   Problem: Skin Integrity: Goal: Risk for impaired skin integrity will decrease 05/15/2022 1050 by Mancel Bale, RN Outcome: Adequate for Discharge 05/15/2022 0757 by Mancel Bale, RN Outcome: Progressing   Problem: Tissue Perfusion: Goal: Adequacy of tissue perfusion will improve 05/15/2022 1050 by Mancel Bale, RN Outcome: Adequate for Discharge 05/15/2022 0757 by Mancel Bale, RN Outcome: Progressing

## 2022-05-15 NOTE — Progress Notes (Signed)
Patient discharged home with family. PIV and tele removed. Discharge packet reviewed with patient, verbalizes understanding. Belongings packed up and given to patient.

## 2022-05-15 NOTE — Discharge Summary (Signed)
Physician Discharge Summary  Rachel Nielsen OIT:[id number] DOB: 12-Mar-1951 DOA: 05/12/2022  PCP: Jearld Fenton, NP  Admit date: 05/12/2022 Discharge date: 05/15/2022  Admitted From: Home Disposition: Home  Recommendations for Outpatient Follow-up:  Follow up with PCP in 1-2 weeks Schedule follow-up with [facility]y.  Home Health: PT/OT Equipment/Devices: Present at home  Discharge Condition: Stable CODE STATUS: Full code Diet recommendation: Low-salt and low-carb diet  Discharge summary: 71 year old with history of anxiety, COPD, MI, hypertension, glaucoma, sleep apnea and multiple previous stroke with residual left-sided weakness presented to the emergency room with new onset right-sided weakness and treated with thrombolytics and admitted to ICU.  Stabilized and transferred to medical floor and stabilized.  # Acute ischemic stroke with history of previous stroke, right MCA territory stroke: Clinical findings, presented with sudden onset right sided weakness, history of left-sided weakness. CT head findings, no acute findings on initial CT scan. MRI of the brain, small acute/subacute infarct of the left precentral gyrus. CT angiogram head and neck: Severe stenosis or occlusion of the left M3 MCA, no large proximal obstruction. 2D echocardiogram, normal ejection fraction.  No evidence of intracardiac thrombus. Antiplatelet therapy, patient on aspirin at home.  Received thrombolysis.  Repeat CT scan normal. As per neurology evaluation, aspirin and Plavix dual antiplatelet therapy indefinitely. LDL 26.  Patient on Crestor.  Continue.  At goal.   Hemoglobin A1c, previously 9.  A1c 7.7.  Patient recently on increasing dose of insulin and followed by endocrine. Therapy recommendations, home health PT OT. Followed by neurology, PT OT.   Type 2 diabetes on insulin: Uncontrolled with hyperglycemia.  A1c 7.7. Patient currently on high-dose insulin and adjusted as outpatient.   Appetite is normal.  Can resume metformin.   Essential hypertension: Blood pressure currently stable.  Stable for discharge.  Discharge Diagnoses:  Principal Problem:   Acute CVA (cerebrovascular accident) [facility])    Discharge Instructions  Discharge Instructions     Diet - low sodium heart healthy   Complete by: As directed    Diet Carb Modified   Complete by: As directed    Increase activity slowly   Complete by: As directed    No wound care   Complete by: As directed       Allergies as of 05/15/2022       Reactions   Ace Inhibitors Cough        Medication List     STOP taking these medications    meloxicam 15 MG tablet Commonly known as: MOBIC   pantoprazole 40 MG tablet Commonly known as: PROTONIX   predniSONE 5 MG tablet Commonly known as: DELTASONE       TAKE these medications    Accu-Chek Guide test strip Generic drug: glucose blood Use to check blood sugar up to twice a day as directed   Accu-Chek Softclix Lancets lancets Use to check blood sugar up to twice a day as directed   albuterol 108 (90 Base) MCG/ACT inhaler Commonly known as: VENTOLIN HFA Inhale 2-4 puffs by mouth every 4 hours as needed for wheezing, cough, and/or shortness of breath What changed:  how much to take when to take this reasons to take this additional instructions   albuterol (2.5 MG/3ML) 0.083% nebulizer solution Commonly known as: PROVENTIL Inhale 3 mLs (2.5 mg total) into the lungs every 4 (four) hours as needed for wheezing or shortness of breath. What changed: when to take this   ALPRAZolam 1 MG tablet Commonly known as: Duanne Moron  Take 1 mg by mouth 3 (three) times daily as needed for anxiety or sleep.   amLODipine 5 MG tablet Commonly known as: NORVASC Take 1 tablet (5 mg total) by mouth daily.   Asenapine Maleate 10 MG Subl Place 5 mg under the tongue at bedtime.   aspirin EC 81 MG tablet Take 1 tablet (81 mg total) by mouth daily. Swallow whole.    bisoprolol 10 MG tablet Commonly known as: ZEBETA Take 1 tablet (10 mg total) by mouth daily. OFFICE VISIT NEEDED FOR ADDITIONAL REFILLS   buPROPion 300 MG 24 hr tablet Commonly known as: WELLBUTRIN XL Take 1 tablet (300 mg total) by mouth daily.   busPIRone 15 MG tablet Commonly known as: BUSPAR Take 15 mg by mouth 2 (two) times daily.   clopidogrel 75 MG tablet Commonly known as: PLAVIX Take 1 tablet (75 mg total) by mouth daily. Start taking on: May 16, 2022   docusate sodium 100 MG capsule Commonly known as: COLACE Take 100 mg by mouth daily.   Dulaglutide 3 MG/0.5ML Sopn Inject 0.5 mLs (3 mg total) subcutaneously once a week   famotidine 20 MG tablet Commonly known as: PEPCID Take 20 mg by mouth 2 (two) times daily.   Farxiga 10 MG Tabs tablet Generic drug: dapagliflozin propanediol Take 10 mg by mouth daily.   fluticasone 50 MCG/ACT nasal spray Commonly known as: FLONASE Place 1 spray into both nostrils daily.   fluticasone-salmeterol 250-50 MCG/ACT Aepb Commonly known as: ADVAIR Inhale 1 puff into the lungs 2 (two) times daily.   gabapentin 600 MG tablet Commonly known as: NEURONTIN Take 600-900 mg by mouth 3 (three) times daily. 600 mg AM, 600 mg pm and 900 mg at bedtime   Global Ease Inject Pen Needles 32G X 4 MM Misc Generic drug: Insulin Pen Needle Use as directed with insulin   BD Pen Needle Nano U/F 32G X 4 MM Misc Generic drug: Insulin Pen Needle Inject 1 Device into the skin daily.   insulin aspart 100 UNIT/ML FlexPen Commonly known as: NOVOLOG Inject 20 Units into the skin 3 (three) times daily with meals.   Insulin Glargine Solostar 100 UNIT/ML Solostar Pen Commonly known as: LANTUS Inject 66 Units subcutaneously at bedtime   INSULIN SYRINGE 1CC/31GX5/16" 31G X 5/16" 1 ML Misc 1 Syringe by Does not apply route in the morning, at noon, in the evening, and at bedtime.   Linzess 290 MCG Caps capsule Generic drug: linaclotide Take 1  capsule (290 mcg total) by mouth daily before breakfast.   lurasidone 20 MG Tabs tablet Commonly known as: LATUDA Take 20 mg by mouth daily.   metFORMIN 1000 MG tablet Commonly known as: GLUCOPHAGE Take 1,000 mg by mouth 2 (two) times daily with a meal.   mirabegron ER 50 MG Tb24 tablet Commonly known as: MYRBETRIQ Take 1 tablet (50 mg total) by mouth daily.   montelukast 10 MG tablet Commonly known as: SINGULAIR Take 1 tablet (10 mg total) by mouth daily.   Olopatadine HCl 0.2 % Soln Place 1 drop into both eyes daily.   omeprazole 40 MG capsule Commonly known as: PRILOSEC Take 1 capsule (40 mg total) by mouth in the morning and at bedtime.   pregabalin 100 MG capsule Commonly known as: LYRICA Take 100 mg by mouth 2 (two) times daily.   rosuvastatin 10 MG tablet Commonly known as: CRESTOR Take 1 tablet (10 mg total) by mouth daily.   Travatan Z 0.004 % Soln ophthalmic solution Generic  drug: Travoprost (BAK Free) Place 1 drop into both eyes at bedtime.        Allergies  Allergen Reactions   Ace Inhibitors Cough    Consultations: Neurology Critical care   Procedures/Studies: CT HEAD WO CONTRAST (5MM)  Result Date: 05/13/2022 CLINICAL DATA:  Stroke.  Follow-up after T NK. EXAM: CT HEAD WITHOUT CONTRAST TECHNIQUE: Contiguous axial images were obtained from the base of the skull through the vertex without intravenous contrast. RADIATION DOSE REDUCTION: This exam was performed according to the departmental dose-optimization program which includes automated exposure control, adjustment of the mA and/or kV according to patient size and/or use of iterative reconstruction technique. COMPARISON:  MRI same day. FINDINGS: Brain: Mild age related atrophy. Subtle evidence of the acute infarction at the apex of a sulcus at the frontoparietal vertex. No evidence of hemorrhage or swelling. No other focal insult. No hydrocephalus or extra-axial collection. Vascular: There is  atherosclerotic calcification of the major vessels at the base of the brain. Skull: Negative Sinuses/Orbits: Clear/normal Other: None IMPRESSION: Subtle low density at the apex of a sulcus at the left frontoparietal vertex consistent with the acute infarction better shown by MRI. No evidence of swelling or hemorrhage. Otherwise, generalized brain atrophy. Electronically Signed   By: Nelson Chimes M.D.   On: 05/13/2022 15:29   ECHOCARDIOGRAM COMPLETE  Result Date: 05/13/2022    ECHOCARDIOGRAM REPORT   Patient Name:   Rachel Nielsen Date of Exam: 05/13/2022 Medical Rec #:  [id number]     Height:       64.0 in Accession #:    [id number]    Weight:       220.0 lb Date of Birth:  1951-06-18     BSA:          2.037 m Patient Age:    71 years      BP:           135/80 mmHg Patient Gender: F             HR:           78 bpm. Exam Location:  [facility] Procedure: 2D Echo, Cardiac Doppler and Color Doppler Indications:     Stroke I63.9  History:         Patient has no prior history of Echocardiogram examinations.                  COPD; Risk Factors:Hypertension.  Sonographer:     [name] Referring Phys:  [id number] Bradly Bienenstock Diagnosing Phys: Kate Sable MD  Sonographer Comments: Technically challenging study due to limited acoustic windows, no apical window and no subcostal window. Image acquisition challenging due to COPD. IMPRESSIONS  1. Left ventricular ejection fraction, by estimation, is 65 to 70%. The left ventricle has normal function. Left ventricular endocardial border not optimally defined to evaluate regional wall motion. There is mild left ventricular hypertrophy. Left ventricular diastolic function could not be evaluated.  2. Right ventricular systolic function is normal. The right ventricular size is not well visualized.  3. The mitral valve is normal in structure. No evidence of mitral valve regurgitation.  4. The aortic valve was not well visualized. Aortic valve regurgitation is not visualized.  FINDINGS  Left Ventricle: Left ventricular ejection fraction, by estimation, is 65 to 70%. The left ventricle has normal function. Left ventricular endocardial border not optimally defined to evaluate regional wall motion. The left ventricular internal cavity size was normal in size. There is mild left  ventricular hypertrophy. Left ventricular diastolic function could not be evaluated. Right Ventricle: The right ventricular size is not well visualized. No increase in right ventricular wall thickness. Right ventricular systolic function is normal. Left Atrium: Left atrial size was normal in size. Right Atrium: Right atrial size was not well visualized. Pericardium: There is no evidence of pericardial effusion. Mitral Valve: The mitral valve is normal in structure. No evidence of mitral valve regurgitation. Tricuspid Valve: The tricuspid valve is normal in structure. Tricuspid valve regurgitation is trivial. Aortic Valve: The aortic valve was not well visualized. Aortic valve regurgitation is not visualized. Pulmonic Valve: The pulmonic valve was not well visualized. Pulmonic valve regurgitation is not visualized. Aorta: The aortic root is normal in size and structure. Venous: The inferior vena cava was not well visualized. IAS/Shunts: The interatrial septum was not well visualized.  LEFT VENTRICLE PLAX 2D LVIDd:         3.60 cm LVIDs:         2.40 cm LV PW:         1.10 cm LV IVS:        1.60 cm LVOT diam:     1.90 cm LVOT Area:     2.84 cm  LEFT ATRIUM         Index LA diam:    3.20 cm 1.57 cm/m   AORTA Ao Root diam: 3.10 cm TRICUSPID VALVE TR Peak grad:   15.2 mmHg TR Vmax:        195.00 cm/s  SHUNTS Systemic Diam: 1.90 cm Kate Sable MD Electronically signed by Kate Sable MD Signature Date/Time: 05/13/2022/2:39:53 PM    Final    MR BRAIN WO CONTRAST  Result Date: 05/13/2022 CLINICAL DATA:  Stroke follow-up. EXAM: MRI HEAD WITHOUT CONTRAST TECHNIQUE: Multiplanar, multiecho pulse sequences of the  brain and surrounding structures were obtained without intravenous contrast. COMPARISON:  Head CT May 12, 2022. MRI of the brain December 24, 2020 FINDINGS: Brain: Area of restricted diffusion in the left precentral gyrus measuring approximately 15 mm, consistent with acute/subacute infarct. No hemorrhage, hydrocephalus, extra-axial collection or mass lesion. No significant white matter disease. Mild parenchymal volume loss, more evident in the bilateral high convexity. Partially empty sella. Vascular: Normal flow voids. Skull and upper cervical spine: Normal marrow signal. Sinuses/Orbits: Negative. Other: None. IMPRESSION: 1. Small acute/subacute infarct the left precentral gyrus. 2. Mild parenchymal volume loss. Electronically Signed   By: Pedro Earls M.D.   On: 05/13/2022 11:10   DG Chest Port 1 View  Result Date: 05/13/2022 CLINICAL DATA:  71 year old female with history of COPD. EXAM: PORTABLE CHEST 1 VIEW COMPARISON:  Chest x-ray 12/21/2021. FINDINGS: Lung volumes are low. Elevation of the right hemidiaphragm, unchanged. No consolidative airspace disease. No pleural effusions. No pneumothorax. No pulmonary nodule or mass noted. Pulmonary vasculature and the cardiomediastinal silhouette are within normal limits. IMPRESSION: 1. Low lung volumes without radiographic evidence of acute cardiopulmonary disease. 2. Persistent elevation of the right hemidiaphragm. Electronically Signed   By: Vinnie Langton M.D.   On: 05/13/2022 05:56   CT ANGIO HEAD NECK W WO CM (CODE STROKE)  Result Date: 05/12/2022 CLINICAL DATA:  Neuro deficit, acute, stroke suspected EXAM: CT ANGIOGRAPHY HEAD AND NECK TECHNIQUE: Multidetector CT imaging of the head and neck was performed using the standard protocol during bolus administration of intravenous contrast. Multiplanar CT image reconstructions and MIPs were obtained to evaluate the vascular anatomy. Carotid stenosis measurements (when applicable) are obtained  utilizing NASCET criteria,  using the distal internal carotid diameter as the denominator. RADIATION DOSE REDUCTION: This exam was performed according to the departmental dose-optimization program which includes automated exposure control, adjustment of the mA and/or kV according to patient size and/or use of iterative reconstruction technique. CONTRAST:  48m OMNIPAQUE IOHEXOL 350 MG/ML SOLN COMPARISON:  None Available. FINDINGS: CTA NECK FINDINGS Aortic arch: Great vessel origins are patent. Right carotid system: No evidence of dissection, stenosis (50% or greater) or occlusion. Left carotid system: No evidence of dissection, stenosis (50% or greater) or occlusion. Vertebral arteries: Left dominant. No evidence of dissection, stenosis (50% or greater) or occlusion. Skeleton: No acute findings.  Multilevel facet arthropathy. Other neck: No acute findings. Upper chest: Visualized lung apices are clear. Review of the MIP images confirms the above findings CTA HEAD FINDINGS Anterior circulation: Bilateral intracranial ICAs, M1 and M2 MCA branches are patent. Possible stenosis versus occlusion of a small left M3 MCA vessel, not well characterized due to small size (for example see series 9, images 135 through 39). Bilateral ACAs are patent. Posterior circulation: Bilateral intradural vertebral arteries basilar artery and PCAs are patent without proximal hemodynamically significant stenosis. Small P1 PCAs with bilateral posterior communicating arteries and small vertebrobasilar system, anatomic variant. Venous sinuses: As permitted by contrast timing, patent. Anatomic variants: Detailed above Review of the MIP images confirms the above findings IMPRESSION: 1. Possible severe stenosis or occlusion of a small left M3 MCA branch not well characterized due to small size. 2. No proximal large vessel occlusion. Findings discussed with Dr. LJeani Hawkingvia telephone at 2:43 p.m. Electronically Signed   By: FMargaretha SheffieldM.D.   On:  05/12/2022 14:52   CT HEAD CODE STROKE WO CONTRAST  Result Date: 05/12/2022 CLINICAL DATA:  Code stroke. Neuro deficit, acute, stroke suspected. Last known normal 2 hours ago. Right-sided weakness and right facial droop. EXAM: CT HEAD WITHOUT CONTRAST TECHNIQUE: Contiguous axial images were obtained from the base of the skull through the vertex without intravenous contrast. RADIATION DOSE REDUCTION: This exam was performed according to the departmental dose-optimization program which includes automated exposure control, adjustment of the mA and/or kV according to patient size and/or use of iterative reconstruction technique. COMPARISON:  CT head without contrast 10/21/2020. MR head 12/26/2020 FINDINGS: Brain: No acute infarct, hemorrhage, or mass lesion is present. Basal ganglia are intact. Insular ribbon is normal. No acute or focal cortical abnormality is present. No significant white matter lesions are present. The brainstem and cerebellum are within normal limits. The ventricles are of normal size. No significant extraaxial fluid collection is present. Vascular: No hyperdense vessel or unexpected calcification. Skull: Calvarium is intact. No focal lytic or blastic lesions are present. No significant extracranial soft tissue lesion is present. Sinuses/Orbits: The paranasal sinuses and mastoid air cells are clear. The globes and orbits are within normal limits. ASPECTS ([facility] Early CT Score) - Ganglionic level infarction (caudate, lentiform nuclei, internal capsule, insula, M1-M3 cortex): 7/7 - Supraganglionic infarction (M4-M6 cortex): 3/3 Total score (0-10 with 10 being normal): 10/10 IMPRESSION: Negative CT of the head. Electronically Signed   By: CSan MorelleM.D.   On: 05/12/2022 14:13   (Echo, Carotid, EGD, Colonoscopy, ERCP)    Subjective: Patient seen and examined.  No overnight events.  Very eager to go home.   Discharge Exam: Vitals:   05/15/22 0420 05/15/22 0746   BP: 140/89 129/81  Pulse: 94 91  Resp: 16 16  Temp: 98.4 F (36.9 C) 97.6 F (36.4 C)  SpO2: 97% 98%   Vitals:   05/15/22 0051 05/15/22 0305 05/15/22 0420 05/15/22 0746  BP: (!) 141/85  140/89 129/81  Pulse: 99  94 91  Resp: '20  16 16  '$ Temp: 98.6 F (37 C)  98.4 F (36.9 C) 97.6 F (36.4 C)  TempSrc:      SpO2: 98%  97% 98%  Weight:  97.6 kg    Height:        General: Pt is alert, awake, not in acute distress Alert and awake.  Oriented x4. No cranial nerve deficits. Right upper extremity 4/5.  Other extremities with normal power but generalized weakness. Cardiovascular: RRR, S1/S2 +, no rubs, no gallops Respiratory: CTA bilaterally, no wheezing, no rhonchi Abdominal: Soft, NT, ND, bowel sounds + Extremities: no edema, no cyanosis    The results of significant diagnostics from this hospitalization (including imaging, microbiology, ancillary and laboratory) are listed below for reference.     Microbiology: Recent Results (from the past 240 hour(s))  MRSA Next Gen by PCR, Nasal     Status: None   Collection Time: 05/13/22  9:03 AM   Specimen: Nasal Mucosa; Nasal Swab  Result Value Ref Range Status   MRSA by PCR Next Gen NOT DETECTED NOT DETECTED Final    Comment: (NOTE) The GeneXpert MRSA Assay (FDA approved for NASAL specimens only), is one component of a comprehensive MRSA colonization surveillance program. It is not intended to diagnose MRSA infection nor to guide or monitor treatment for MRSA infections. Test performance is not FDA approved in patients less than 64 years old. Performed at [facility], [address] [place], [place] [address]   MRSA Next Gen by PCR, Nasal     Status: None   Collection Time: 05/13/22  6:35 PM   Specimen: Nasal Mucosa; Nasal Swab  Result Value Ref Range Status   MRSA by PCR Next Gen NOT DETECTED NOT DETECTED Final    Comment: (NOTE) The GeneXpert MRSA Assay (FDA approved for NASAL specimens only), is one  component of a comprehensive MRSA colonization surveillance program. It is not intended to diagnose MRSA infection nor to guide or monitor treatment for MRSA infections. Test performance is not FDA approved in patients less than 40 years old. Performed at [facility], [address] [place], [place] [address]      Labs: BNP (last 3 results) Recent Labs    09/15/21 0337  BNP 55.9   Basic Metabolic Panel: Recent Labs  Lab 05/12/22 1357 05/12/22 1429 05/13/22 0412 05/14/22 0551  NA 140  --  140 143  K 3.8  --  4.2 4.3  CL 109  --  107 107  CO2 25  --  26 27  GLUCOSE 127*  --  222* 154*  BUN 17  --  14 12  CREATININE 0.95 1.00 1.01*  1.10* 1.04*  CALCIUM 8.9  --  8.7* 9.1  MG  --   --  2.0 2.1  PHOS  --   --  3.3 3.9   Liver Function Tests: Recent Labs  Lab 05/12/22 1357  AST 21  ALT 19  ALKPHOS 70  BILITOT 0.5  PROT 6.9  ALBUMIN 3.6   No results for input(s): "LIPASE", "AMYLASE" in the last 168 hours. No results for input(s): "AMMONIA" in the last 168 hours. CBC: Recent Labs  Lab 05/12/22 1357 05/13/22 0412 05/14/22 0551  WBC 9.7 8.6 8.7  NEUTROABS 5.4  --   --   HGB 12.5  12.2 12.3  HCT 42.5 41.7 41.5  MCV 84.5 84.4 83.7  PLT 208 186 182   Cardiac Enzymes: No results for input(s): "CKTOTAL", "CKMB", "CKMBINDEX", "TROPONINI" in the last 168 hours. BNP: Invalid input(s): "POCBNP" CBG: Recent Labs  Lab 05/14/22 0740 05/14/22 1204 05/14/22 1605 05/14/22 2009 05/15/22 0747  GLUCAP 159* 185* 227* 162* 184*   D-Dimer No results for input(s): "DDIMER" in the last 72 hours. Hgb A1c Recent Labs    05/14/22 0551  HGBA1C 7.7*   Lipid Profile Recent Labs    05/15/22 0439  CHOL 68  HDL 32*  LDLCALC 24  TRIG 59  CHOLHDL 2.1   Thyroid function studies No results for input(s): "TSH", "T4TOTAL", "T3FREE", "THYROIDAB" in the last 72 hours.  Invalid input(s): "FREET3" Anemia work up No results for input(s): "VITAMINB12", "FOLATE",  "FERRITIN", "TIBC", "IRON", "RETICCTPCT" in the last 72 hours. Urinalysis    Component Value Date/Time   COLORURINE YELLOW (A) 05/13/2022 0417   APPEARANCEUR HAZY (A) 05/13/2022 0417   APPEARANCEUR Clear 01/26/2022 1104   LABSPEC 1.029 05/13/2022 0417   PHURINE 5.0 05/13/2022 0417   GLUCOSEU NEGATIVE 05/13/2022 0417   HGBUR SMALL (A) 05/13/2022 0417   BILIRUBINUR NEGATIVE 05/13/2022 0417   BILIRUBINUR Negative 01/26/2022 1104   KETONESUR NEGATIVE 05/13/2022 0417   PROTEINUR NEGATIVE 05/13/2022 0417   NITRITE POSITIVE (A) 05/13/2022 0417   LEUKOCYTESUR SMALL (A) 05/13/2022 0417   Sepsis Labs Recent Labs  Lab 05/12/22 1357 05/13/22 0412 05/14/22 0551  WBC 9.7 8.6 8.7   Microbiology Recent Results (from the past 240 hour(s))  MRSA Next Gen by PCR, Nasal     Status: None   Collection Time: 05/13/22  9:03 AM   Specimen: Nasal Mucosa; Nasal Swab  Result Value Ref Range Status   MRSA by PCR Next Gen NOT DETECTED NOT DETECTED Final    Comment: (NOTE) The GeneXpert MRSA Assay (FDA approved for NASAL specimens only), is one component of a comprehensive MRSA colonization surveillance program. It is not intended to diagnose MRSA infection nor to guide or monitor treatment for MRSA infections. Test performance is not FDA approved in patients less than 41 years old. Performed at [facility], [address] [place], [place] [address]   MRSA Next Gen by PCR, Nasal     Status: None   Collection Time: 05/13/22  6:35 PM   Specimen: Nasal Mucosa; Nasal Swab  Result Value Ref Range Status   MRSA by PCR Next Gen NOT DETECTED NOT DETECTED Final    Comment: (NOTE) The GeneXpert MRSA Assay (FDA approved for NASAL specimens only), is one component of a comprehensive MRSA colonization surveillance program. It is not intended to diagnose MRSA infection nor to guide or monitor treatment for MRSA infections. Test performance is not FDA approved in patients less than 61  years old. Performed at [facility], [address] [place], [place] [address]      Time coordinating discharge:  35 minutes  SIGNED:   Barb Merino, MD  [facility] 05/15/2022, 9:47 AM

## 2022-05-15 NOTE — Plan of Care (Signed)

## 2022-05-15 NOTE — Telephone Encounter (Signed)
Transition Care Management Follow-up Telephone Call Date of discharge and from where: [facility] 05/15/22 How have you been since you were released from the hospital? WEAK Any questions or concerns? Yes  Items Reviewed: Did the pt receive and understand the discharge instructions provided? Yes  Medications obtained and verified? Yes  Other? No  Any new allergies since your discharge? No  Dietary orders reviewed? No Do you have support at home? Yes   Home Care and Equipment/Supplies: Were home health services ordered? no She said she was going to call the therapist from her last stroke and get him to help her  Functional Questionnaire: (I = Independent and D = Dependent) ADLs: D   Bathing/Dressing- D  Meal Prep- D  Eating- I  Maintaining continence- I  Transferring/Ambulation- D  Managing Meds- I  Follow up appointments reviewed:  PCP Hospital f/u appt confirmed? No  She refused to schedule appointment and said she would call back when she felt she could make the trip in to the office.  Are transportation arrangements needed? No  If their condition worsens, is the pt aware to call PCP or go to the Emergency Dept.? Yes Was the patient provided with contact information for the PCP's office or ED? Yes Was to pt encouraged to call back with questions or concerns? Yes

## 2022-05-17 DIAGNOSIS — E1165 Type 2 diabetes mellitus with hyperglycemia: Secondary | ICD-10-CM | POA: Diagnosis not present

## 2022-05-18 ENCOUNTER — Ambulatory Visit: Payer: Medicare HMO | Admitting: Internal Medicine

## 2022-05-18 NOTE — Progress Notes (Deleted)
Subjective:    Patient ID: Rachel Nielsen, female    DOB: 1951/04/26, 71 y.o.   MRN: [id number]  HPI  Pt presents to the clinic today for TCM hospital follow up. She went to the ER 7/11 with right sided weakness. Labs were unremarkable.  CT head did not show any acute findings.  CTA of the head showed possible severe stenosis or occlusion of a small left MCA branch but no large vessel occlusion.  MRI brain showed small acute/subacute infarct of the left precentral gyrus and mild parenchymal volume loss.  2D echo showed a normal EF without thrombus.  She received thrombolytics.  Repeat CT head was normal.  Neurology was consulted.  She was on Aspirin prior to hospitalization.  She was started on Plavix indefinitely.  She was advised to continue her Rosuvastatin.  Her A1c was 7.7%, she was advised to continue current medications and follow-up with endocrinology.  She was discharged on 7/14 with home PT/OT.  Since discharge.  Review of Systems     Past Medical History:  Diagnosis Date   Allergy    Anxiety    Asthma    Colon polyps    Constipation    COPD (chronic obstructive pulmonary disease) (HCC)    Depression    GERD (gastroesophageal reflux disease)    Glaucoma    Heart attack ([employer])    Hypertension    Sleep apnea    Sleep apnea    Stroke [facility])    Urinary incontinence     Current Outpatient Medications  Medication Sig Dispense Refill   ACCU-CHEK GUIDE test strip Use to check blood sugar up to twice a day as directed 100 strip 3   Accu-Chek Softclix Lancets lancets Use to check blood sugar up to twice a day as directed 100 each 3   albuterol (PROVENTIL) (2.5 MG/3ML) 0.083% nebulizer solution Inhale 3 mLs (2.5 mg total) into the lungs every 4 (four) hours as needed for wheezing or shortness of breath. (Patient taking differently: Inhale 2.5 mg into the lungs every 6 (six) hours as needed for wheezing or shortness of breath.) 75 mL 12   albuterol (VENTOLIN HFA) 108 (90 Base) MCG/ACT  inhaler Inhale 2-4 puffs by mouth every 4 hours as needed for wheezing, cough, and/or shortness of breath (Patient taking differently: 2 puffs every 6 (six) hours as needed.) 6.7 g 0   ALPRAZolam (XANAX) 1 MG tablet Take 1 mg by mouth 3 (three) times daily as needed for anxiety or sleep.     amLODipine (NORVASC) 5 MG tablet Take 1 tablet (5 mg total) by mouth daily. 90 tablet 1   Asenapine Maleate 10 MG SUBL Place 5 mg under the tongue at bedtime.     aspirin 81 MG EC tablet Take 1 tablet (81 mg total) by mouth daily. Swallow whole. 30 tablet 12   bisoprolol (ZEBETA) 10 MG tablet Take 1 tablet (10 mg total) by mouth daily. OFFICE VISIT NEEDED FOR ADDITIONAL REFILLS 90 tablet 0   buPROPion (WELLBUTRIN XL) 300 MG 24 hr tablet Take 1 tablet (300 mg total) by mouth daily. 30 tablet 0   busPIRone (BUSPAR) 15 MG tablet Take 15 mg by mouth 2 (two) times daily.     clopidogrel (PLAVIX) 75 MG tablet Take 1 tablet (75 mg total) by mouth daily. 30 tablet 2   docusate sodium (COLACE) 100 MG capsule Take 100 mg by mouth daily.     Dulaglutide 3 MG/0.5ML SOPN Inject 0.5 mLs (3  mg total) subcutaneously once a week     famotidine (PEPCID) 20 MG tablet Take 20 mg by mouth 2 (two) times daily.     FARXIGA 10 MG TABS tablet Take 10 mg by mouth daily.     fluticasone (FLONASE) 50 MCG/ACT nasal spray Place 1 spray into both nostrils daily. 16 g 2   fluticasone-salmeterol (ADVAIR) 250-50 MCG/ACT AEPB Inhale 1 puff into the lungs 2 (two) times daily.     gabapentin (NEURONTIN) 600 MG tablet Take 600-900 mg by mouth 3 (three) times daily. 600 mg AM, 600 mg pm and 900 mg at bedtime     GLOBAL EASE INJECT PEN NEEDLES 32G X 4 MM MISC Use as directed with insulin 100 each 0   insulin aspart (NOVOLOG) 100 UNIT/ML FlexPen Inject 20 Units into the skin 3 (three) times daily with meals.     Insulin Glargine Solostar (LANTUS) 100 UNIT/ML Solostar Pen Inject 66 Units subcutaneously at bedtime     Insulin Pen Needle (BD PEN NEEDLE  NANO U/F) 32G X 4 MM MISC Inject 1 Device into the skin daily. 100 each 3   Insulin Syringe-Needle U-100 (INSULIN SYRINGE 1CC/31GX5/16") 31G X 5/16" 1 ML MISC 1 Syringe by Does not apply route in the morning, at noon, in the evening, and at bedtime. 200 each 3   LINZESS 290 MCG CAPS capsule Take 1 capsule (290 mcg total) by mouth daily before breakfast. 90 capsule 3   lurasidone (LATUDA) 20 MG TABS tablet Take 20 mg by mouth daily.     metFORMIN (GLUCOPHAGE) 1000 MG tablet Take 1,000 mg by mouth 2 (two) times daily with a meal.     mirabegron ER (MYRBETRIQ) 50 MG TB24 tablet Take 1 tablet (50 mg total) by mouth daily. 30 tablet 11   montelukast (SINGULAIR) 10 MG tablet Take 1 tablet (10 mg total) by mouth daily. 30 tablet 0   Olopatadine HCl 0.2 % SOLN Place 1 drop into both eyes daily. 2.5 mL 0   omeprazole (PRILOSEC) 40 MG capsule Take 1 capsule (40 mg total) by mouth in the morning and at bedtime. 180 capsule 0   pregabalin (LYRICA) 100 MG capsule Take 100 mg by mouth 2 (two) times daily.     rosuvastatin (CRESTOR) 10 MG tablet Take 1 tablet (10 mg total) by mouth daily. 90 tablet 0   TRAVATAN Z 0.004 % SOLN ophthalmic solution Place 1 drop into both eyes at bedtime. 5 mL 0   No current facility-administered medications for this visit.    Allergies  Allergen Reactions   Ace Inhibitors Cough    Family History  Problem Relation Age of Onset   Hypertension Mother    Atrial fibrillation Mother    Heart attack Mother    Hypertension Father    Stomach cancer Father    Diabetes Father    Liver cancer Father    Diabetes Sister    Hypertension Sister     Social History   Socioeconomic History   Marital status: Legally Separated    Spouse name: Not on file   Number of children: 4   Years of education: Not on file   Highest education level: Not on file  Occupational History   Occupation: retired  Tobacco Use   Smoking status: Former   Smokeless tobacco: Never   Tobacco  comments:    4 months ago  Scientific laboratory technician Use: Some days   Substances: Nicotine, Flavoring  Substance and Sexual Activity  Alcohol use: Not Currently    Comment: monthly or less    Drug use: No   Sexual activity: Not on file  Other Topics Concern   Not on file  Social History Narrative   Not on file   Social Determinants of Health   Financial Resource Strain: Low Risk  (01/12/2022)   Overall Financial Resource Strain (CARDIA)    Difficulty of Paying Living Expenses: Not hard at all  Food Insecurity: No Food Insecurity (01/12/2022)   Hunger Vital Sign    Worried About Running Out of Food in the Last Year: Never true    Ran Out of Food in the Last Year: Never true  Transportation Needs: No Transportation Needs (01/12/2022)   PRAPARE - Hydrologist (Medical): No    Lack of Transportation (Non-Medical): No  Physical Activity: Inactive (01/12/2022)   Exercise Vital Sign    Days of Exercise per Week: 0 days    Minutes of Exercise per Session: 0 min  Stress: No Stress Concern Present (01/12/2022)   [place]    Feeling of Stress : Not at all  Social Connections: Socially Isolated (01/12/2022)   Social Connection and Isolation Panel [NHANES]    Frequency of Communication with Friends and Family: More than three times a week    Frequency of Social Gatherings with Friends and Family: More than three times a week    Attends Religious Services: Never    Marine scientist or Organizations: No    Attends Archivist Meetings: Never    Marital Status: Separated  Intimate Partner Violence: Not At Risk (01/12/2022)   Humiliation, Afraid, Rape, and Kick questionnaire    Fear of Current or Ex-Partner: No    Emotionally Abused: No    Physically Abused: No    Sexually Abused: No     Constitutional: Denies fever, malaise, fatigue, headache or abrupt weight changes.  HEENT: Denies  eye pain, eye redness, ear pain, ringing in the ears, wax buildup, runny nose, nasal congestion, bloody nose, or sore throat. Respiratory: Denies difficulty breathing, shortness of breath, cough or sputum production.   Cardiovascular: Denies chest pain, chest tightness, palpitations or swelling in the hands or feet.  Gastrointestinal: Denies abdominal pain, bloating, constipation, diarrhea or blood in the stool.  GU: Denies urgency, frequency, pain with urination, burning sensation, blood in urine, odor or discharge. Musculoskeletal: Patient reports chronic left-sided weakness.  Denies decrease in range of motion, difficulty with gait, muscle pain or joint pain and swelling.  Skin: Denies redness, rashes, lesions or ulcercations.  Neurological: Denies dizziness, difficulty with memory, difficulty with speech or problems with balance and coordination.  Psych: Denies anxiety, depression, SI/HI.  No other specific complaints in a complete review of systems (except as listed in HPI above).  Objective:   Physical Exam   There were no vitals taken for this visit. Wt Readings from Last 3 Encounters:  05/15/22 215 lb 2.7 oz (97.6 kg)  03/02/22 220 lb (99.8 kg)  02/10/22 220 lb (99.8 kg)    General: Appears their stated age, well developed, well nourished in NAD. Skin: Warm, dry and intact. No rashes, lesions or ulcerations noted. HEENT: Head: normal shape and size; Eyes: sclera white, no icterus, conjunctiva pink, PERRLA and EOMs intact; Ears: Tm's gray and intact, normal light reflex; Nose: mucosa pink and moist, septum midline; Throat/Mouth: Teeth present, mucosa pink and moist, no exudate, lesions or ulcerations  noted.  Neck:  Neck supple, trachea midline. No masses, lumps or thyromegaly present.  Cardiovascular: Normal rate and rhythm. S1,S2 noted.  No murmur, rubs or gallops noted. No JVD or BLE edema. No carotid bruits noted. Pulmonary/Chest: Normal effort and positive vesicular breath  sounds. No respiratory distress. No wheezes, rales or ronchi noted.  Abdomen: Soft and nontender. Normal bowel sounds. No distention or masses noted. Liver, spleen and kidneys non palpable. Musculoskeletal: Normal range of motion. No signs of joint swelling. No difficulty with gait.  Neurological: Alert and oriented. Cranial nerves II-XII grossly intact. Coordination normal.  Psychiatric: Mood and affect normal. Behavior is normal. Judgment and thought content normal.   BMET    Component Value Date/Time   NA 143 05/14/2022 0551   NA 141 10/19/2019 1323   K 4.3 05/14/2022 0551   CL 107 05/14/2022 0551   CO2 27 05/14/2022 0551   GLUCOSE 154 (H) 05/14/2022 0551   BUN 12 05/14/2022 0551   BUN 19 10/19/2019 1323   CREATININE 1.04 (H) 05/14/2022 0551   CREATININE 0.94 02/10/2022 1111   CALCIUM 9.1 05/14/2022 0551   GFRNONAA 58 (L) 05/14/2022 0551   GFRAA 59 (L) 08/03/2020 0637    Lipid Panel     Component Value Date/Time   CHOL 68 05/15/2022 0439   TRIG 59 05/15/2022 0439   HDL 32 (L) 05/15/2022 0439   CHOLHDL 2.1 05/15/2022 0439   VLDL 12 05/15/2022 0439   LDLCALC 24 05/15/2022 0439   LDLCALC 26 02/10/2022 1111    CBC    Component Value Date/Time   WBC 8.7 05/14/2022 0551   RBC 4.96 05/14/2022 0551   HGB 12.3 05/14/2022 0551   HCT 41.5 05/14/2022 0551   PLT 182 05/14/2022 0551   MCV 83.7 05/14/2022 0551   MCH 24.8 (L) 05/14/2022 0551   MCHC 29.6 (L) 05/14/2022 0551   RDW 15.9 (H) 05/14/2022 0551   LYMPHSABS 3.0 05/12/2022 1357   MONOABS 0.7 05/12/2022 1357   EOSABS 0.6 (H) 05/12/2022 1357   BASOSABS 0.0 05/12/2022 1357    Hgb A1C Lab Results  Component Value Date   HGBA1C 7.7 (H) 05/14/2022           Assessment & Plan:   [facility] follow-up for acute CVA, HTN, HLD, DM2:  ER notes, labs and imaging reviewed Discussed the importance of good blood sugar, cholesterol and blood pressure control She will continue current meds She will follow-up with  neurology and endocrinology as previously scheduled  RTC in 3 months for follow-up of chronic conditions Webb Silversmith, NP

## 2022-05-18 NOTE — Telephone Encounter (Signed)
Will discuss at upcoming appt.

## 2022-05-24 DIAGNOSIS — F419 Anxiety disorder, unspecified: Secondary | ICD-10-CM | POA: Diagnosis not present

## 2022-05-24 DIAGNOSIS — F321 Major depressive disorder, single episode, moderate: Secondary | ICD-10-CM | POA: Diagnosis not present

## 2022-05-25 ENCOUNTER — Other Ambulatory Visit: Payer: Self-pay | Admitting: Internal Medicine

## 2022-05-25 DIAGNOSIS — E1165 Type 2 diabetes mellitus with hyperglycemia: Secondary | ICD-10-CM

## 2022-05-26 NOTE — Telephone Encounter (Signed)
Requested Prescriptions  Pending Prescriptions Disp Refills  . ACCU-CHEK GUIDE test strip [Pharmacy Med Name: ACCU-CHEK GUIDE TEST STRIP] 100 strip 0    Sig: Use to check blood sugar up to twice a day as directed     Endocrinology: Diabetes - Testing Supplies Passed - 05/25/2022 12:01 PM      Passed - Valid encounter within last 12 months    Recent Outpatient Visits          3 months ago Encounter for general adult medical examination with abnormal findings   [facility], Coralie Keens, NP   4 months ago COPD exacerbation [facility])   [facility], Coralie Keens, NP   10 months ago Mixed diabetic hyperlipidemia associated with type 2 diabetes mellitus [facility])   [facility], Coralie Keens, NP   1 year ago Uncontrolled type 2 diabetes mellitus with hyperglycemia [facility])   [facility] Olin Hauser, DO   1 year ago Encounter to establish care with new doctor   [facility], Lupita Raider, FNP      Future Appointments            In 8 months MacDiarmid, Nicki Reaper, MD [place]           . Accu-Chek Softclix Lancets lancets [Pharmacy Med Name: ACCU-CHEK SOFTCLIX LANCETS] 100 each 0    Sig: Use to check blood sugar up to twice a day as directed     Endocrinology: Diabetes - Testing Supplies Passed - 05/25/2022 12:01 PM      Passed - Valid encounter within last 12 months    Recent Outpatient Visits          3 months ago Encounter for general adult medical examination with abnormal findings   [facility], Coralie Keens, NP   4 months ago COPD exacerbation [facility])   [facility], Coralie Keens, NP   10 months ago Mixed diabetic hyperlipidemia associated with type 2 diabetes mellitus [facility])   [facility], Coralie Keens, NP   1 year ago Uncontrolled type 2 diabetes mellitus with hyperglycemia [facility])   [facility]  Olin Hauser, DO   1 year ago Encounter to establish care with new doctor   [facility], Lupita Raider, FNP      Future Appointments            In 8 months MacDiarmid, Nicki Reaper, [facility]

## 2022-06-01 DIAGNOSIS — M5412 Radiculopathy, cervical region: Secondary | ICD-10-CM | POA: Diagnosis not present

## 2022-06-01 DIAGNOSIS — E119 Type 2 diabetes mellitus without complications: Secondary | ICD-10-CM | POA: Diagnosis not present

## 2022-06-01 DIAGNOSIS — E785 Hyperlipidemia, unspecified: Secondary | ICD-10-CM | POA: Diagnosis not present

## 2022-06-01 DIAGNOSIS — J449 Chronic obstructive pulmonary disease, unspecified: Secondary | ICD-10-CM | POA: Diagnosis not present

## 2022-06-01 DIAGNOSIS — I69322 Dysarthria following cerebral infarction: Secondary | ICD-10-CM | POA: Diagnosis not present

## 2022-06-01 DIAGNOSIS — I69353 Hemiplegia and hemiparesis following cerebral infarction affecting right non-dominant side: Secondary | ICD-10-CM | POA: Diagnosis not present

## 2022-06-01 DIAGNOSIS — I10 Essential (primary) hypertension: Secondary | ICD-10-CM | POA: Diagnosis not present

## 2022-06-07 DIAGNOSIS — F411 Generalized anxiety disorder: Secondary | ICD-10-CM | POA: Diagnosis not present

## 2022-06-07 DIAGNOSIS — F419 Anxiety disorder, unspecified: Secondary | ICD-10-CM | POA: Diagnosis not present

## 2022-06-07 DIAGNOSIS — F321 Major depressive disorder, single episode, moderate: Secondary | ICD-10-CM | POA: Diagnosis not present

## 2022-06-08 ENCOUNTER — Ambulatory Visit: Payer: Medicare HMO | Admitting: Pharmacist

## 2022-06-08 DIAGNOSIS — E1165 Type 2 diabetes mellitus with hyperglycemia: Secondary | ICD-10-CM

## 2022-06-08 DIAGNOSIS — Z8673 Personal history of transient ischemic attack (TIA), and cerebral infarction without residual deficits: Secondary | ICD-10-CM

## 2022-06-08 NOTE — Patient Instructions (Signed)
Visit Information  Thank you for taking time to visit with me today. Please don't hesitate to contact me if I can be of assistance to you before our next scheduled telephone appointment.  Following are the goals we discussed today:   Goals Addressed             This Visit's Progress    Pharmacy Goals       Our goal A1c is less than 7%. This corresponds with fasting sugars less than 130 and 2 hour after meal sugars less than 180. Please check your blood sugar and keep a log of the results  Our goal bad cholesterol, or LDL, is less than 70 . This is why it is important to continue taking your rosuvastatin.  Feel free to call me with any questions or concerns. I look forward to our next call!  Wallace Cullens, PharmD, [place] [phone]         Our next appointment is by telephone on 06/22/2022 at 9:00 AM  Please call the care guide team at [phone] if you need to cancel or reschedule your appointment.    Patient verbalizes understanding of instructions and care plan provided today and agrees to view in [place]. Active MyChart status and patient understanding of how to access instructions and care plan via MyChart confirmed with patient.

## 2022-06-08 NOTE — Chronic Care Management (AMB) (Signed)
Chronic Care Management CCM Pharmacy Note  06/08/2022 Name:  Rachel Nielsen MRN:  [id number] DOB:  December 15, 1950   Subjective: Rachel Nielsen is an 71 y.o. year old female who is a primary patient of Jearld Fenton, NP.  The CCM team was consulted for assistance with disease management and care coordination needs.    Engaged with patient by telephone for follow up visit for pharmacy case management and/or care coordination services.   Objective:  Medications    Reviewed by Brantley Stage, RN (Registered Nurse) on 05/12/22 at [place] List Status: <None>   Medication Order Taking? Sig Documenting Provider Last Dose Status Informant  ACCU-CHEK GUIDE test strip [id number]  Use to check blood sugar up to twice a day as directed Jearld Fenton, NP  Active Self  Accu-Chek Softclix Lancets lancets [id number]  Use to check blood sugar up to twice a day as directed Jearld Fenton, NP  Active Self  albuterol (PROVENTIL) (2.5 MG/3ML) 0.083% nebulizer solution [id number]  Inhale 3 mLs (2.5 mg total) into the lungs every 4 (four) hours as needed for wheezing or shortness of breath. Lorella Nimrod, MD  Active   albuterol (VENTOLIN HFA) 108 (90 Base) MCG/ACT inhaler [id number]  Inhale 2-4 puffs by mouth every 4 hours as needed for wheezing, cough, and/or shortness of breath Malfi, Lupita Raider, FNP  Active Multiple Informants, Self  ALPRAZolam (XANAX) 1 MG tablet [id number]  Take 1 mg by mouth 3 (three) times daily as needed. [provider]  Active Multiple Informants, Self  amLODipine (NORVASC) 5 MG tablet [id number]  Take 1 tablet (5 mg total) by mouth daily. Jearld Fenton, NP  Active   asenapine (SAPHRIS) 5 MG SUBL 24 hr tablet [id number]  Place 5 mg under the tongue every evening. [provider]  Active Multiple Informants, Self  aspirin 81 MG EC tablet [id number]  Take 1 tablet (81 mg total) by mouth daily. Swallow whole. Jearld Fenton, NP  Active   bisoprolol (ZEBETA) 10 MG tablet  [id number]  Take 1 tablet (10 mg total) by mouth daily. OFFICE VISIT NEEDED FOR ADDITIONAL REFILLS Jearld Fenton, NP  Active   buPROPion (WELLBUTRIN XL) 300 MG 24 hr tablet [id number]  Take 1 tablet (300 mg total) by mouth daily. Carrie Mew, MD  Active Multiple Informants, Self  busPIRone (BUSPAR) 15 MG tablet [id number]  Take 15 mg by mouth 2 (two) times daily.  [provider]  Active Multiple Informants, Self  docusate sodium (COLACE) 100 MG capsule [id number]  Take 100 mg by mouth daily. [provider]  Active   Dulaglutide 3 MG/0.5ML SOPN [id number]  Inject 0.5 mLs (3 mg total) subcutaneously once a week [provider]  Active   FARXIGA 10 MG TABS tablet [id number]  Take 10 mg by mouth daily. [provider]  Active Multiple Informants, Self  fluticasone (FLONASE) 50 MCG/ACT nasal spray [id number]  Place 1 spray into both nostrils daily. Jearld Fenton, NP  Active   fluticasone-salmeterol (ADVAIR) 250-50 MCG/ACT AEPB [id number]  Inhale 1 puff into the lungs 2 (two) times daily. [provider]  Active Multiple Informants, Self  gabapentin (NEURONTIN) 600 MG tablet [id number]  Take 600-900 mg by mouth 3 (three) times daily. 600 mg AM, 600 mg pm and 900 mg at bedtime [provider]  Active Multiple Informants, Self  GLOBAL EASE INJECT PEN NEEDLES 32G X 4 MM MISC [id number]  Use as directed with insulin [place],  Audie Clear, NP  Active Multiple Informants, Self  insulin aspart (NOVOLOG) 100 UNIT/ML FlexPen [id number]  Inject 20 units before the meals, three times daily. Do not take after meals. [provider]  Active   Insulin Glargine Solostar (LANTUS) 100 UNIT/ML Solostar Pen [id number]  Inject 66 Units subcutaneously at bedtime [provider]  Active   Insulin Pen Needle (BD PEN NEEDLE NANO U/F) 32G X 4 MM MISC [id number]  Inject 1 Device into the skin daily. Jearld Fenton, NP  Active Multiple Informants, Self  Insulin  Syringe-Needle U-100 (INSULIN SYRINGE 1CC/31GX5/16") 31G X 5/16" 1 ML MISC [id number]  1 Syringe by Does not apply route in the morning, at noon, in the evening, and at bedtime. Jearld Fenton, NP  Active Multiple Informants, Self  LINZESS 290 MCG CAPS capsule [id number]  Take 1 capsule (290 mcg total) by mouth daily before breakfast. Jearld Fenton, NP  Active   lurasidone (LATUDA) 20 MG TABS tablet [id number]  Take 1 tablet by mouth daily. [provider]  Active Spouse/Significant Other  metFORMIN (GLUCOPHAGE) 1000 MG tablet [id number]  Take 1,000 mg by mouth 2 (two) times daily with a meal. [provider]  Active   mirabegron ER (MYRBETRIQ) 50 MG TB24 tablet [id number]  Take 1 tablet (50 mg total) by mouth daily. Bjorn Loser, MD  Active   montelukast (SINGULAIR) 10 MG tablet [id number]  Take 1 tablet (10 mg total) by mouth daily. Carrie Mew, MD  Active Multiple Informants, Self  Olopatadine HCl 0.2 % SOLN [id number]  Place 1 drop into both eyes daily. Verl Bangs, FNP  Active Multiple Informants, Self  omeprazole (PRILOSEC) 40 MG capsule [id number]  Take 1 capsule (40 mg total) by mouth in the morning and at bedtime. Jearld Fenton, NP  Active   predniSONE (DELTASONE) 5 MG tablet [id number]  Take 1 tablet (5 mg total) by mouth once daily [provider]  Active Spouse/Significant Other  pregabalin (LYRICA) 50 MG capsule [id number]  Take 50 mg in the morning and 100 mg at night for a week then increase to 100 mg twice a day [provider]  Active Spouse/Significant Other  rosuvastatin (CRESTOR) 10 MG tablet [id number]  Take 1 tablet (10 mg total) by mouth daily. Jearld Fenton, NP  Active   TRAVATAN Z 0.004 % SOLN ophthalmic solution [id number]  Place 1 drop into both eyes at bedtime. Carrie Mew, MD  Active Multiple Informants, Self            Pertinent Labs:  Lab Results  Component Value Date   HGBA1C 7.7 (H) 05/14/2022   Lab Results   Component Value Date   CHOL 68 05/15/2022   HDL 32 (L) 05/15/2022   LDLCALC 24 05/15/2022   TRIG 59 05/15/2022   CHOLHDL 2.1 05/15/2022   Lab Results  Component Value Date   CREATININE 1.04 (H) 05/14/2022   BUN 12 05/14/2022   NA 143 05/14/2022   K 4.3 05/14/2022   CL 107 05/14/2022   CO2 27 05/14/2022    SDOH:  (Social Determinants of Health) assessments and interventions performed:    [place]  Review of patient past medical history, allergies, medications, health status, including review of consultants reports, laboratory and other test data, was performed as part of comprehensive evaluation and provision of chronic care management services.   Care Plan : PharmD - Medication Management/T2DM  Updates made by Rennis Petty, RPH-CPP since 06/08/2022 12:00 AM  Problem: Disease Progression      Long-Range Goal: Disease Progression Prevented or Minimized   Start Date: 01/19/2022  Expected End Date: 04/19/2022  Priority: High  Note:   Current Barriers:  Unable to achieve control of blood sugar  Chronic Disease Management support and education needs related to managing HTN, GERD, major depressive disorder, Type II DM, history of CVA, diabetic neuropathy, GAD, OSA, HLD, constipation, obesity, COPD  Pharmacist Clinical Goal(s):  patient will achieve adherence to monitoring guidelines and medication adherence to achieve therapeutic efficacy achieve control of blood sugar as evidenced by A1C  through collaboration with PharmD and provider.    Interventions: 1:1 collaboration with Jearld Fenton, NP regarding development and update of comprehensive plan of care as evidenced by provider attestation and co-signature Inter-disciplinary care team collaboration (see longitudinal plan of care) Perform chart review Patient admitted to [facility] on [facility]/12/2022 related to acute CVA. Patient discharged home on 05/15/2022. Discharging provider advised  patient: Follow up with PCP in 1-2 weeks Schedule follow-up with neurology New?Medications Started at [facility] Discharge:?? Clopidogrel 75 mg daily Medications Discontinued at Hospital Discharge: Meloxicam Pantoprazole Prednisone Patient missed appointment with PCP on 7/17 Per encounter from [facility] Neurology on 7/20, provider place order for PT/ OT for right sided weakness following CVA  Today patient declines to review medications with CM Pharmacist Reports has reviewed instructions from hospital discharge Reports has started taking clopidogrel 75 mg daily as directed Note risk of drug-drug interaction for clopidogrel and omeprazole, since patient started on clopidogrel on 7/13 Concurrent use with omeprazole may result in decreased clopidogrel effectiveness Will collaborate with PCP to recommend switch from omeprazole to pantoprazole to reduce risk of interaction with clopidogrel Patient reports remains weak since stroke and has poor control of her hands.  Reports started OT and PT last week Advise patient to contact office to reschedule missed post-hospitalization appointment with PCP  T2DM: Uncontrolled; current treatment: Metformin 1000 mg twice daily Trulicity 3 mg weekly on Fridays Lantus 66 units nightly Novolog 20 units three times daily before meals Farxiga 10 mg daily Reports has been monitoring home blood sugar, but does not have record of results to review today Recalls reading this morning before breakfast: 144 Denies symptoms of hypoglycemia Discuss importance of having regular well-balanced meals, while controlling carbohydrate portion sizes Statin: rosuvastatin 10 mg daily Encourage patient to monitor home blood sugar three times daily as directed by Endocrinologist and to follow up with clinic for readings outside of established parameters or new symptoms, particularly if she has any episodes/symptoms of hypoglycemia   Patient Goals/Self-Care  Activities patient will:  - take medications as prescribed as evidenced by patient report and record review - check glucose, document, and provide at future appointments - check blood pressure, document, and provide at future appointments       Plan: Telephone follow up appointment with care management team member scheduled for:  06/22/2022 at 9:00 AM  Wallace Cullens, PharmD, [place] [phone]

## 2022-06-15 DIAGNOSIS — J441 Chronic obstructive pulmonary disease with (acute) exacerbation: Secondary | ICD-10-CM | POA: Diagnosis not present

## 2022-06-15 DIAGNOSIS — J189 Pneumonia, unspecified organism: Secondary | ICD-10-CM | POA: Diagnosis not present

## 2022-06-15 DIAGNOSIS — J9601 Acute respiratory failure with hypoxia: Secondary | ICD-10-CM | POA: Diagnosis not present

## 2022-06-17 DIAGNOSIS — E1165 Type 2 diabetes mellitus with hyperglycemia: Secondary | ICD-10-CM | POA: Diagnosis not present

## 2022-06-22 ENCOUNTER — Ambulatory Visit: Payer: Self-pay

## 2022-06-22 ENCOUNTER — Telehealth: Payer: Medicare HMO

## 2022-06-22 ENCOUNTER — Ambulatory Visit: Payer: Medicare HMO | Admitting: Pharmacist

## 2022-06-22 DIAGNOSIS — E1165 Type 2 diabetes mellitus with hyperglycemia: Secondary | ICD-10-CM

## 2022-06-22 DIAGNOSIS — Z8673 Personal history of transient ischemic attack (TIA), and cerebral infarction without residual deficits: Secondary | ICD-10-CM

## 2022-06-22 DIAGNOSIS — Z9181 History of falling: Secondary | ICD-10-CM

## 2022-06-22 NOTE — Chronic Care Management (AMB) (Signed)
Chronic Care Management CCM Pharmacy Note  06/22/2022 Name:  Rachel Nielsen MRN:  [id number] DOB:  1950/12/16   Subjective: Rachel Nielsen is an 71 y.o. year old female who is a primary patient of Jearld Fenton, NP.  The CCM team was consulted for assistance with disease management and care coordination needs.    Engaged with patient by telephone for follow up visit for pharmacy case management and/or care coordination services.   Objective:  Medications Reviewed Today     Reviewed by Vanita Ingles, RN (Case Manager) on 06/22/22 at 84  Med List Status: <None>   Medication Order Taking? Sig Documenting Provider Last Dose Status Informant  ACCU-CHEK GUIDE test strip [id number]  Use to check blood sugar up to twice a day as directed Jearld Fenton, NP  Active   Accu-Chek Softclix Lancets lancets [id number]  Use to check blood sugar up to twice a day as directed Jearld Fenton, NP  Active   albuterol (PROVENTIL) (2.5 MG/3ML) 0.083% nebulizer solution [id number] No Inhale 3 mLs (2.5 mg total) into the lungs every 4 (four) hours as needed for wheezing or shortness of breath.  Patient taking differently: Inhale 2.5 mg into the lungs every 6 (six) hours as needed for wheezing or shortness of breath.   Lorella Nimrod, MD Taking Active Self, Multiple Informants  albuterol (VENTOLIN HFA) 108 (90 Base) MCG/ACT inhaler [id number] No Inhale 2-4 puffs by mouth every 4 hours as needed for wheezing, cough, and/or shortness of breath  Patient taking differently: 2 puffs every 6 (six) hours as needed.   Malfi, Lupita Raider, FNP Taking Active Multiple Informants, Self  ALPRAZolam Duanne Moron) 1 MG tablet [id number] No Take 1 mg by mouth 3 (three) times daily as needed for anxiety or sleep. [provider] Taking Active Self  amLODipine (NORVASC) 5 MG tablet [id number] No Take 1 tablet (5 mg total) by mouth daily. Jearld Fenton, NP Taking Active Self, Multiple Informants  Asenapine Maleate 10 MG SUBL  [id number] No Place 5 mg under the tongue at bedtime. [provider] Taking Active Multiple Informants, Self  aspirin 81 MG EC tablet [id number] No Take 1 tablet (81 mg total) by mouth daily. Swallow whole. Jearld Fenton, NP Taking Active Self, Multiple Informants  bisoprolol (ZEBETA) 10 MG tablet [id number] No Take 1 tablet (10 mg total) by mouth daily. OFFICE VISIT NEEDED FOR ADDITIONAL REFILLS Jearld Fenton, NP Taking Active Self, Multiple Informants  buPROPion (WELLBUTRIN XL) 300 MG 24 hr tablet [id number] No Take 1 tablet (300 mg total) by mouth daily. Carrie Mew, MD Taking Active Multiple Informants, Self  busPIRone (BUSPAR) 15 MG tablet [id number] No Take 15 mg by mouth 2 (two) times daily. [provider] Taking Active Self  clopidogrel (PLAVIX) 75 MG tablet [id number] No Take 1 tablet (75 mg total) by mouth daily. Barb Merino, MD Taking Active   docusate sodium (COLACE) 100 MG capsule [id number] No Take 100 mg by mouth daily. [provider] Taking Active Self, Multiple Informants  Dulaglutide 3 MG/0.5ML SOPN [id number] No Inject 0.5 mLs (3 mg total) subcutaneously once a week [provider] Taking Active Self, Multiple Informants  famotidine (PEPCID) 20 MG tablet [id number] No Take 20 mg by mouth 2 (two) times daily.  Patient not taking: Reported on 06/22/2022   [provider] Not Taking Active Multiple Informants, Self  FARXIGA 10 MG TABS tablet [id number] No Take 10 mg by mouth daily. [provider] Taking Active  Multiple Informants, Self  fluticasone (FLONASE) 50 MCG/ACT nasal spray [id number] No Place 1 spray into both nostrils daily. Jearld Fenton, NP Taking Active Self, Multiple Informants  fluticasone-salmeterol (ADVAIR) 250-50 MCG/ACT AEPB [id number] No Inhale 1 puff into the lungs 2 (two) times daily. [provider] Taking Active Multiple Informants, Self  gabapentin (NEURONTIN) 600 MG tablet [id number] No Take  600-900 mg by mouth 3 (three) times daily. 600 mg AM, 600 mg pm and 900 mg at bedtime [provider] Taking Active Multiple Informants, Self  GLOBAL EASE INJECT PEN NEEDLES 32G X 4 MM MISC [id number] No Use as directed with insulin Kendell Bane, NP Taking Active Multiple Informants, Self  insulin aspart (NOVOLOG) 100 UNIT/ML FlexPen [id number] No Inject 20 Units into the skin 3 (three) times daily with meals. [provider] Taking Active Self, Multiple Informants  Insulin Glargine Solostar (LANTUS) 100 UNIT/ML Solostar Pen [id number] No Inject 66 Units subcutaneously at bedtime [provider] Taking Active Self, Multiple Informants  Insulin Pen Needle (BD PEN NEEDLE NANO U/F) 32G X 4 MM MISC [id number] No Inject 1 Device into the skin daily. Jearld Fenton, NP Taking Active Multiple Informants, Self  Insulin Syringe-Needle U-100 (INSULIN SYRINGE 1CC/31GX5/16") 31G X 5/16" 1 ML MISC [id number] No 1 Syringe by Does not apply route in the morning, at noon, in the evening, and at bedtime. Jearld Fenton, NP Taking Active Multiple Informants, Self  LINZESS 290 MCG CAPS capsule [id number] No Take 1 capsule (290 mcg total) by mouth daily before breakfast. Jearld Fenton, NP Taking Active Self, Multiple Informants  lurasidone (LATUDA) 20 MG TABS tablet [id number] No Take 20 mg by mouth daily. [provider] 05/12/2022 0730 Active Self  metFORMIN (GLUCOPHAGE) 1000 MG tablet [id number] No Take 1,000 mg by mouth 2 (two) times daily with a meal. [provider] Taking Active Self, Multiple Informants  mirabegron ER (MYRBETRIQ) 50 MG TB24 tablet [id number] No Take 1 tablet (50 mg total) by mouth daily. Bjorn Loser, MD Taking Active Self, Multiple Informants  montelukast (SINGULAIR) 10 MG tablet [id number] No Take 1 tablet (10 mg total) by mouth daily. Carrie Mew, MD Taking Active Multiple Informants, Self  Olopatadine HCl 0.2 % SOLN [id number] No Place 1 drop  into both eyes daily. Malfi, Lupita Raider, FNP Taking Active Multiple Informants, Self  omeprazole (PRILOSEC) 40 MG capsule [id number] No Take 1 capsule (40 mg total) by mouth in the morning and at bedtime. Jearld Fenton, NP Taking Active Self, Multiple Informants  pregabalin (LYRICA) 100 MG capsule [id number] No Take 100 mg by mouth 3 (three) times daily. [provider] Taking Active Self, Multiple Informants  rosuvastatin (CRESTOR) 10 MG tablet [id number] No Take 1 tablet (10 mg total) by mouth daily. Jearld Fenton, NP Taking Active Self, Multiple Informants  TRAVATAN Z 0.004 % SOLN ophthalmic solution [id number] No Place 1 drop into both eyes at bedtime. Carrie Mew, MD Taking Active Multiple Informants, Self            Pertinent Labs:  Lab Results  Component Value Date   HGBA1C 7.7 (H) 05/14/2022   Lab Results  Component Value Date   CHOL 68 05/15/2022   HDL 32 (L) 05/15/2022   LDLCALC 24 05/15/2022   TRIG 59 05/15/2022   CHOLHDL 2.1 05/15/2022   Lab Results  Component Value Date   CREATININE 1.04 (H) 05/14/2022   BUN 12 05/14/2022   NA 143 05/14/2022   K 4.3 05/14/2022  CL 107 05/14/2022   CO2 27 05/14/2022  Calculate CrCl (using adjusted body weight) ~59 mL/min  SDOH:  (Social Determinants of Health) assessments and interventions performed:    [place]  Review of patient past medical history, allergies, medications, health status, including review of consultants reports, laboratory and other test data, was performed as part of comprehensive evaluation and provision of chronic care management services.   Care Plan : PharmD - Medication Management/T2DM  Updates made by Rennis Petty, RPH-CPP since 06/22/2022 12:00 AM     Problem: Disease Progression      Long-Range Goal: Disease Progression Prevented or Minimized   Start Date: 01/19/2022  Expected End Date: 04/19/2022  Priority: High  Note:   Current Barriers:  Unable to achieve control  of blood sugar  Chronic Disease Management support and education needs related to managing HTN, GERD, major depressive disorder, Type II DM, history of CVA, diabetic neuropathy, GAD, OSA, HLD, constipation, obesity, COPD  Pharmacist Clinical Goal(s):  patient will achieve adherence to monitoring guidelines and medication adherence to achieve therapeutic efficacy achieve control of blood sugar as evidenced by A1C  through collaboration with PharmD and provider.    Interventions: 1:1 collaboration with Jearld Fenton, NP regarding development and update of comprehensive plan of care as evidenced by provider attestation and co-signature Inter-disciplinary care team collaboration (see longitudinal plan of care) Start to review medications with patient today. However, during review, patient knocks over some of her bottles and declines to complete review. Note when last spoke with patient on 8/7, she reported taking clopidogrel 75 mg daily as directed. Per call to [place] today, clopidogrel Rx was last filled 8/7 and delivered to patient on 8/8. Patient writes down the name of this medication and states will review bottles later today and will contact pharmacy if unable to locate this one Reports continuing to take both Lyrica and gabapentin as directed by Neurologist and has recently increased Lyrica to latest dose from Neurologist, Lyrica 100 mg three times daily Caution patient for increased risk of dizziness/sedation with this combination. Patient verbalizes understanding and plans to follow up with Neurologist regarding risk vs. benefits at appointments Place coordination of care call to [facility] to express concern regarding increased risk of dizziness/sedation with therapeutic duplication of using both Lyrica and gabapentin, particularly given patient's renal function, current doses and both medications' renal clearance Leave message with CMA Jinny Blossom Note risk of drug-drug interaction  for clopidogrel and omeprazole, since patient started on clopidogrel on 7/13 Concurrent use with omeprazole may result in decreased clopidogrel effectiveness Collaborated with PCP to recommend switch from omeprazole to pantoprazole to reduce risk of interaction with clopidogrel. PCP planned to discuss this change with patient Note patient scheduled for appointment with PCP on 8/29 Reports started OT and PT currently coming ~twice/week  Medication Adherence: Reports continues to use weekly pillbox to organize her medications/as adherence aid Encourage patient to also use latest medication list as aid when filling weekly pillbox to ensure all current medications are included  T2DM: Patient followed by [facility] Endocrinology Uncontrolled; current treatment: Metformin 1000 mg twice daily Trulicity 3 mg weekly on Fridays Lantus 66 units nightly Novolog 20 units three times daily before meals Farxiga 10 mg daily Reports has been monitoring home blood sugar, but does not have record of results to review today Recalls reading this morning before breakfast: 123 Denies symptoms of hypoglycemia Discuss importance of having regular well-balanced meals, while controlling carbohydrate portion sizes  Statin: rosuvastatin 10 mg daily Reports received and started using [employer]estyle Libre 2 Continuous Glucose Monitor (CGM) reader and sensors, but experienced an error with the reader and had to mail this back to the company, currently waiting on a replacement to resume using CGM Confirms monitoring with glucometer while waiting to restart using CGM Encourage patient to monitor home blood as directed by Endocrinologist and to follow up with clinic for readings outside of established parameters or new symptoms Advise patient to contact endocrinology office to schedule 39-monthfollow up appointment   Patient Goals/Self-Care Activities patient will:  - take medications as prescribed as evidenced by  patient report and record review - check glucose, document, and provide at future appointments - check blood pressure, document, and provide at future appointments       Plan: Telephone follow up appointment with care management team member scheduled for:  11/20 at 11 am  EWallace Cullens PharmD, [phone]

## 2022-06-22 NOTE — Patient Instructions (Signed)
Visit Information  Thank you for taking time to visit with me today. Please don't hesitate to contact me if I can be of assistance to you before our next scheduled telephone appointment.  Following are the goals we discussed today:   Goals Addressed             This Visit's Progress    Pharmacy Goals       Our goal A1c is less than 7%. This corresponds with fasting sugars less than 130 and 2 hour after meal sugars less than 180. Please check your blood sugar and keep a log of the results  Our goal bad cholesterol, or LDL, is less than 70 . This is why it is important to continue taking your rosuvastatin.  Feel free to call me with any questions or concerns. I look forward to our next call!  Wallace Cullens, PharmD, [place] [phone]         Our next appointment is by telephone on 11/20 at 11 am  Please call the care guide team at [phone] if you need to cancel or reschedule your appointment.    Patient verbalizes understanding of instructions and care plan provided today and agrees to view in [place]. Active MyChart status and patient understanding of how to access instructions and care plan via MyChart confirmed with patient.

## 2022-06-22 NOTE — Patient Outreach (Signed)
  Care Coordination   Follow Up Visit Note   06/22/2022 Name: Rachel Nielsen MRN: [id number] DOB: 19-Oct-1951  Rachel Nielsen is a 71 y.o. year old female who sees Rachel Nielsen, Rachel Keens, NP for primary care. I spoke with  Rachel Nielsen by phone today  What matters to the patients health and wellness today?  Not being able to remember anything since having the stroke and being hospitalized in July    Goals Addressed             This Visit's Progress    RNCM: Effective Management of post CVA       Care Coordination Interventions: Evaluation of current treatment plan related to recent CVA with memory changes and patient's adherence to plan as established by provider Advised patient to call the office for changes in conditions, questions, or worsening sx and sx of memory problems- is seeing neurologist also Provided education to patient re: pacing activity, staying hydrated, eating balanced meals, taking medications as directed, working with PT, calling the office for assistance with new concerns Reviewed medications with patient and discussed compliance. The patient states that she spoke to the pharm D today and the patient states her son "has her back" and is helping her with her medications Collaborated with pharm D  regarding the patient stating she forgot what she was supposed to tell pcp on her upcoming visit that she is having some memory concerns. Education and support given. Have spoke to the pharm D and message delivered.  Provided patient with fall and safety educational materials related to high risk for falls and safety concerns Reviewed scheduled/upcoming provider appointments including 06-30-2022 at 220 pm Pharmacy referral for assistance with medications. The patient is already working with the patient for assistance with medications and education needs.  Discussed plans with patient for ongoing care management follow up and provided patient with direct contact information for care  management team Advised patient to discuss changes in her health and well being, questions, or concerns she may have with provider Screening for signs and symptoms of depression related to chronic disease state  Assessed social determinant of health barriers Review of falls prevention and safety           SDOH assessments and interventions completed:  Yes  SDOH Interventions Today    Flowsheet Row Most Recent Value  SDOH Interventions   Food Insecurity Interventions Intervention Not Indicated  Housing Interventions Intervention Not Indicated  Transportation Interventions Intervention Not Indicated        Care Coordination Interventions Activated:  Yes  Care Coordination Interventions:  Yes, provided   Follow up plan: Follow up call scheduled for 07-23-2022 at 2 pm    Encounter Outcome:  Pt. Visit Completed   Rachel Larsson RN, MSN, [facility] Mobile: [phone]

## 2022-06-22 NOTE — Patient Instructions (Signed)
Visit Information  Thank you for taking time to visit with me today. Please don't hesitate to contact me if I can be of assistance to you.   Following are the goals we discussed today:   Goals Addressed             This Visit's Progress    RNCM: Effective Management of post CVA       Care Coordination Interventions: Evaluation of current treatment plan related to recent CVA with memory changes and patient's adherence to plan as established by provider Advised patient to call the office for changes in conditions, questions, or worsening sx and sx of memory problems- is seeing neurologist also Provided education to patient re: pacing activity, staying hydrated, eating balanced meals, taking medications as directed, working with PT, calling the office for assistance with new concerns Reviewed medications with patient and discussed compliance. The patient states that she spoke to the pharm D today and the patient states her son "has her back" and is helping her with her medications Collaborated with pharm D  regarding the patient stating she forgot what she was supposed to tell pcp on her upcoming visit that she is having some memory concerns. Education and support given. Have spoke to the pharm D and message delivered.  Provided patient with fall and safety educational materials related to high risk for falls and safety concerns Reviewed scheduled/upcoming provider appointments including 06-30-2022 at 220 pm Pharmacy referral for assistance with medications. The patient is already working with the patient for assistance with medications and education needs.  Discussed plans with patient for ongoing care management follow up and provided patient with direct contact information for care management team Advised patient to discuss changes in her health and well being, questions, or concerns she may have with provider Screening for signs and symptoms of depression related to chronic disease state   Assessed social determinant of health barriers Review of falls prevention and safety           Our next appointment is by telephone on 07-23-2022 at 2 pm  Please call the care guide team at [phone] if you need to cancel or reschedule your appointment.   If you are experiencing a Mental Health or [place] or need someone to talk to, please call the Suicide and Crisis Lifeline: 988 call the [place] National Suicide Prevention Lifeline: (619)863-5640 or TTY: 251-139-8065 TTY 7054019103) to talk to a trained counselor call 1-800-273-TALK (toll free, 24 hour hotline)  Patient verbalizes understanding of instructions and care plan provided today and agrees to view in [place]. Active MyChart status and patient understanding of how to access instructions and care plan via MyChart confirmed with patient.     Telephone follow up appointment with care management team member scheduled for: 07-23-2022 at 2 pm  Benld, MSN, [facility] Mobile: [phone]

## 2022-06-23 DIAGNOSIS — F419 Anxiety disorder, unspecified: Secondary | ICD-10-CM | POA: Diagnosis not present

## 2022-06-23 DIAGNOSIS — F321 Major depressive disorder, single episode, moderate: Secondary | ICD-10-CM | POA: Diagnosis not present

## 2022-06-25 ENCOUNTER — Ambulatory Visit: Payer: Medicare HMO | Admitting: Internal Medicine

## 2022-06-29 ENCOUNTER — Other Ambulatory Visit: Payer: Self-pay | Admitting: Internal Medicine

## 2022-06-30 ENCOUNTER — Ambulatory Visit (INDEPENDENT_AMBULATORY_CARE_PROVIDER_SITE_OTHER): Payer: Medicare HMO | Admitting: Internal Medicine

## 2022-06-30 ENCOUNTER — Ambulatory Visit: Payer: Medicare HMO | Admitting: Internal Medicine

## 2022-06-30 ENCOUNTER — Encounter: Payer: Self-pay | Admitting: Internal Medicine

## 2022-06-30 VITALS — BP 133/73 | HR 92 | Temp 96.2°F | Wt 229.0 lb

## 2022-06-30 DIAGNOSIS — K219 Gastro-esophageal reflux disease without esophagitis: Secondary | ICD-10-CM | POA: Diagnosis not present

## 2022-06-30 DIAGNOSIS — E1169 Type 2 diabetes mellitus with other specified complication: Secondary | ICD-10-CM

## 2022-06-30 DIAGNOSIS — N3281 Overactive bladder: Secondary | ICD-10-CM | POA: Diagnosis not present

## 2022-06-30 DIAGNOSIS — M5412 Radiculopathy, cervical region: Secondary | ICD-10-CM

## 2022-06-30 DIAGNOSIS — E1165 Type 2 diabetes mellitus with hyperglycemia: Secondary | ICD-10-CM

## 2022-06-30 DIAGNOSIS — J432 Centrilobular emphysema: Secondary | ICD-10-CM

## 2022-06-30 DIAGNOSIS — I10 Essential (primary) hypertension: Secondary | ICD-10-CM

## 2022-06-30 DIAGNOSIS — E782 Mixed hyperlipidemia: Secondary | ICD-10-CM

## 2022-06-30 DIAGNOSIS — N1831 Chronic kidney disease, stage 3a: Secondary | ICD-10-CM

## 2022-06-30 DIAGNOSIS — M5416 Radiculopathy, lumbar region: Secondary | ICD-10-CM

## 2022-06-30 DIAGNOSIS — Z8673 Personal history of transient ischemic attack (TIA), and cerebral infarction without residual deficits: Secondary | ICD-10-CM

## 2022-06-30 DIAGNOSIS — K5909 Other constipation: Secondary | ICD-10-CM | POA: Diagnosis not present

## 2022-06-30 DIAGNOSIS — Z6839 Body mass index (BMI) 39.0-39.9, adult: Secondary | ICD-10-CM

## 2022-06-30 DIAGNOSIS — E1142 Type 2 diabetes mellitus with diabetic polyneuropathy: Secondary | ICD-10-CM | POA: Diagnosis not present

## 2022-06-30 DIAGNOSIS — G4733 Obstructive sleep apnea (adult) (pediatric): Secondary | ICD-10-CM

## 2022-06-30 DIAGNOSIS — F331 Major depressive disorder, recurrent, moderate: Secondary | ICD-10-CM

## 2022-06-30 DIAGNOSIS — N183 Chronic kidney disease, stage 3 unspecified: Secondary | ICD-10-CM | POA: Insufficient documentation

## 2022-06-30 DIAGNOSIS — F411 Generalized anxiety disorder: Secondary | ICD-10-CM

## 2022-06-30 LAB — POCT GLYCOSYLATED HEMOGLOBIN (HGB A1C): Hemoglobin A1C: 7.1 % — AB (ref 4.0–5.6)

## 2022-06-30 MED ORDER — PANTOPRAZOLE SODIUM 40 MG PO TBEC: 40.0000 mg | DELAYED_RELEASE_TABLET | Freq: Two times a day (BID) | ORAL | 1 refills | Status: DC

## 2022-06-30 NOTE — Assessment & Plan Note (Signed)
On amlodipine and bisoprolol Reinforced DASH diet and exercise weight loss We will monitor

## 2022-06-30 NOTE — Assessment & Plan Note (Signed)
Continue gabapentin, pregabalin and tramadol prescribed by neurology

## 2022-06-30 NOTE — Assessment & Plan Note (Signed)
Encourage weight loss as this can help reduce sleep apnea symptoms She does not have a CPAP machine

## 2022-06-30 NOTE — Assessment & Plan Note (Signed)
Encourage diet and exercise for weight loss 

## 2022-06-30 NOTE — Assessment & Plan Note (Signed)
Continue Latuda, Wellbutrin, buspirone and Xanax She will need to meet with her psychiatrist and therapist Support offered

## 2022-06-30 NOTE — Assessment & Plan Note (Signed)
Continue rosuvastatin, ezetimibe, bisoprolol, aspirin and Plavix Encourage low-fat diet

## 2022-06-30 NOTE — Assessment & Plan Note (Signed)
Continue Advair and albuterol She will continue to follow with pulmonology 

## 2022-06-30 NOTE — Assessment & Plan Note (Signed)
POCT A1c 7.1% Urine my gravity when checked 01/2022 Continue Lantus, NovoLog, Trulicity and Farxiga Her eye exam is scheduled for 1 month from now Encourage routine foot exams Encourage low-carb diet and exercise for weight loss Encouraged her to get a flu shot in the fall Pneumonia vaccine UTD

## 2022-06-30 NOTE — Assessment & Plan Note (Signed)
Continue Myrbetriq 

## 2022-06-30 NOTE — Assessment & Plan Note (Signed)
Continue pregabalin and gabapentin per neurology Discussed the importance of good diabetic control

## 2022-06-30 NOTE — Telephone Encounter (Signed)
Requested Prescriptions  Pending Prescriptions Disp Refills  . omeprazole (PRILOSEC) 40 MG capsule [Pharmacy Med Name: OMEPRAZOLE DR 40 MG CAPSULE] 180 capsule 0    Sig: Take 1 capsule (40 mg total) by mouth in the morning and at bedtime.     Gastroenterology: Proton Pump Inhibitors Passed - 06/29/2022 12:01 PM      Passed - Valid encounter within last 12 months    Recent Outpatient Visits          4 months ago Encounter for general adult medical examination with abnormal findings   [facility] [place], Coralie Keens, NP   5 months ago COPD exacerbation [facility])   [facility], NP   11 months ago Mixed diabetic hyperlipidemia associated with type 2 diabetes mellitus [facility])   [facility], NP   1 year ago Uncontrolled type 2 diabetes mellitus with hyperglycemia [facility])   [facility] Olin Hauser, DO   1 year ago Encounter to establish care with new doctor   [facility], Lupita Raider, FNP      Future Appointments            Today Garnette Gunner, Coralie Keens, NP [facility], [place]   In 6 months [place], [facility]

## 2022-06-30 NOTE — Assessment & Plan Note (Signed)
Continue Linzess Encouraged high fiber diet and adequate water intake 

## 2022-06-30 NOTE — Assessment & Plan Note (Signed)
Kidney function reviewed Currently not on ACEI/ARB

## 2022-06-30 NOTE — Progress Notes (Signed)
Subjective:    Patient ID: Rachel Nielsen, female    DOB: 09-Oct-1951, 71 y.o.   MRN: [id number]  HPI  Patient presents to clinic today for follow-up of chronic conditions.  HTN: Her BP today is 133/73.  She is taking Amlodipine and Bisoprolol as prescribed.  ECG from 05/2022 reviewed.  HLD with History of Stroke: Her last LDL was 24, triglycerides 59, 05/2022.  She denies myalgias on Rosuvastatin and Ezetimibe.  She is taking Bisoprolol, Aspirin and Plavix as well.  She does not consume a low-fat diet.  She follows with neurology  GERD: Triggered by everything she eats.  She has breakthrough on Omeprazole and Famotadine.  Pharmacy recommended that we switch this to Pantoprazole to reduce the interaction with Plavix.  Upper GI from 07/2018 reviewed.  DM2 with Neuropathy: Her last A1c was 7.7%, 05/2022.Marland Kitchen  She is taking Lantus, Novalog, Trulicity, Farxiga, Pregabalin and Gabapentin as prescribed.  Her sugars range 77-220.  She checks her feet routinely.  Her last eye exam was more than 1 year ago.  Flu 10/2020.  Pneumovax never.  Prevnar 20 07/2019.  COVID never.  She follows with endocrinology.  Anxiety and Depression: Chronic, managed on  Latuda, Wellbutrin, Buspirone and Xanax.  She is currently seeing a therapist and a psychiatrist.  She denies SI/HI.  COPD: She denies chronic cough but does have shortness of breath with exertion.  She is using Advair and Albuterol as prescribed.  There are no PFTs on file. She follows with pulmonology.  OSA: She averages 7 hours per sleep without the use of her CPAP.  Sleep study from 12/2017 reviewed.  Chronic Constipation: Managed with Linzess.  Colonoscopy from 07/2018 reviewed.  CKD 3: Her last creatinine was 1.08, GFR 58, 05/2022.  She is not currently on an ACEI/ARB.  She does not follow with nephrology.  Cervical/Lumbar Radiculopathy: She is taking Gabapentin, Pregabalin and Tramadol which is prescribed by neurology.  OAB: She reports mainly frequency  and urgency.  She is taking Myrbetriq as prescribed.  She does not follow with urology.  Review of Systems     Past Medical History:  Diagnosis Date   Allergy    Anxiety    Asthma    Colon polyps    Constipation    COPD (chronic obstructive pulmonary disease) (HCC)    Depression    GERD (gastroesophageal reflux disease)    Glaucoma    Heart attack ([place])    Hypertension    Sleep apnea    Sleep apnea    Stroke [facility])    Urinary incontinence     Current Outpatient Medications  Medication Sig Dispense Refill   ACCU-CHEK GUIDE test strip Use to check blood sugar up to twice a day as directed 200 strip 2   Accu-Chek Softclix Lancets lancets Use to check blood sugar up to twice a day as directed 200 each 2   albuterol (PROVENTIL) (2.5 MG/3ML) 0.083% nebulizer solution Inhale 3 mLs (2.5 mg total) into the lungs every 4 (four) hours as needed for wheezing or shortness of breath. (Patient taking differently: Inhale 2.5 mg into the lungs every 6 (six) hours as needed for wheezing or shortness of breath.) 75 mL 12   albuterol (VENTOLIN HFA) 108 (90 Base) MCG/ACT inhaler Inhale 2-4 puffs by mouth every 4 hours as needed for wheezing, cough, and/or shortness of breath (Patient taking differently: 2 puffs every 6 (six) hours as needed.) 6.7 g 0   ALPRAZolam (XANAX) 1 MG tablet  Take 1 mg by mouth 3 (three) times daily as needed for anxiety or sleep.     amLODipine (NORVASC) 5 MG tablet Take 1 tablet (5 mg total) by mouth daily. 90 tablet 1   Asenapine Maleate 10 MG SUBL Place 5 mg under the tongue at bedtime.     aspirin 81 MG EC tablet Take 1 tablet (81 mg total) by mouth daily. Swallow whole. 30 tablet 12   bisoprolol (ZEBETA) 10 MG tablet Take 1 tablet (10 mg total) by mouth daily. OFFICE VISIT NEEDED FOR ADDITIONAL REFILLS 90 tablet 0   buPROPion (WELLBUTRIN XL) 300 MG 24 hr tablet Take 1 tablet (300 mg total) by mouth daily. 30 tablet 0   busPIRone (BUSPAR) 15 MG tablet Take 15 mg by mouth 2  (two) times daily.     clopidogrel (PLAVIX) 75 MG tablet Take 1 tablet (75 mg total) by mouth daily. 30 tablet 2   docusate sodium (COLACE) 100 MG capsule Take 100 mg by mouth daily.     Dulaglutide 3 MG/0.5ML SOPN Inject 0.5 mLs (3 mg total) subcutaneously once a week     famotidine (PEPCID) 20 MG tablet Take 20 mg by mouth 2 (two) times daily. (Patient not taking: Reported on 06/22/2022)     FARXIGA 10 MG TABS tablet Take 10 mg by mouth daily.     fluticasone (FLONASE) 50 MCG/ACT nasal spray Place 1 spray into both nostrils daily. 16 g 2   fluticasone-salmeterol (ADVAIR) 250-50 MCG/ACT AEPB Inhale 1 puff into the lungs 2 (two) times daily.     gabapentin (NEURONTIN) 600 MG tablet Take 600-900 mg by mouth 3 (three) times daily. 600 mg AM, 600 mg pm and 900 mg at bedtime     GLOBAL EASE INJECT PEN NEEDLES 32G X 4 MM MISC Use as directed with insulin 100 each 0   insulin aspart (NOVOLOG) 100 UNIT/ML FlexPen Inject 20 Units into the skin 3 (three) times daily with meals.     Insulin Glargine Solostar (LANTUS) 100 UNIT/ML Solostar Pen Inject 66 Units subcutaneously at bedtime     Insulin Pen Needle (BD PEN NEEDLE NANO U/F) 32G X 4 MM MISC Inject 1 Device into the skin daily. 100 each 3   Insulin Syringe-Needle U-100 (INSULIN SYRINGE 1CC/31GX5/16") 31G X 5/16" 1 ML MISC 1 Syringe by Does not apply route in the morning, at noon, in the evening, and at bedtime. 200 each 3   LINZESS 290 MCG CAPS capsule Take 1 capsule (290 mcg total) by mouth daily before breakfast. 90 capsule 3   lurasidone (LATUDA) 20 MG TABS tablet Take 20 mg by mouth daily.     metFORMIN (GLUCOPHAGE) 1000 MG tablet Take 1,000 mg by mouth 2 (two) times daily with a meal.     mirabegron ER (MYRBETRIQ) 50 MG TB24 tablet Take 1 tablet (50 mg total) by mouth daily. 30 tablet 11   montelukast (SINGULAIR) 10 MG tablet Take 1 tablet (10 mg total) by mouth daily. 30 tablet 0   Olopatadine HCl 0.2 % SOLN Place 1 drop into both eyes daily. 2.5  mL 0   omeprazole (PRILOSEC) 40 MG capsule Take 1 capsule (40 mg total) by mouth in the morning and at bedtime. 180 capsule 0   pregabalin (LYRICA) 100 MG capsule Take 100 mg by mouth 3 (three) times daily.     rosuvastatin (CRESTOR) 10 MG tablet Take 1 tablet (10 mg total) by mouth daily. 90 tablet 0   TRAVATAN Z 0.004 %  SOLN ophthalmic solution Place 1 drop into both eyes at bedtime. 5 mL 0   No current facility-administered medications for this visit.    Allergies  Allergen Reactions   Ace Inhibitors Cough    Family History  Problem Relation Age of Onset   Hypertension Mother    Atrial fibrillation Mother    Heart attack Mother    Hypertension Father    Stomach cancer Father    Diabetes Father    Liver cancer Father    Diabetes Sister    Hypertension Sister     Social History   Socioeconomic History   Marital status: Legally Separated    Spouse name: Not on file   Number of children: 4   Years of education: Not on file   Highest education level: Not on file  Occupational History   Occupation: retired  Tobacco Use   Smoking status: Former   Smokeless tobacco: Never   Tobacco comments:    4 months ago  Scientific laboratory technician Use: Some days   Substances: Nicotine, Flavoring  Substance and Sexual Activity   Alcohol use: Not Currently    Comment: monthly or less    Drug use: No   Sexual activity: Not on file  Other Topics Concern   Not on file  Social History Narrative   Not on file   Social Determinants of Health   Financial Resource Strain: Low Risk  (01/12/2022)   Overall Financial Resource Strain (CARDIA)    Difficulty of Paying Living Expenses: Not hard at all  Food Insecurity: No Food Insecurity (06/22/2022)   Hunger Vital Sign    Worried About Running Out of Food in the Last Year: Never true    [place] in the Last Year: Never true  Transportation Needs: No Transportation Needs (06/22/2022)   PRAPARE - Hydrologist  (Medical): No    Lack of Transportation (Non-Medical): No  Physical Activity: Inactive (01/12/2022)   Exercise Vital Sign    Days of Exercise per Week: 0 days    Minutes of Exercise per Session: 0 min  Stress: No Stress Concern Present (01/12/2022)   [place]    Feeling of Stress : Not at all  Social Connections: Socially Isolated (01/12/2022)   Social Connection and Isolation Panel [NHANES]    Frequency of Communication with Friends and Family: More than three times a week    Frequency of Social Gatherings with Friends and Family: More than three times a week    Attends Religious Services: Never    Marine scientist or Organizations: No    Attends Archivist Meetings: Never    Marital Status: Separated  Intimate Partner Violence: Not At Risk (06/22/2022)   Humiliation, Afraid, Rape, and Kick questionnaire    Fear of Current or Ex-Partner: No    Emotionally Abused: No    Physically Abused: No    Sexually Abused: No     Constitutional: Denies fever, malaise, fatigue, headache or abrupt weight changes.  HEENT: Denies eye pain, eye redness, ear pain, ringing in the ears, wax buildup, runny nose, nasal congestion, bloody nose, or sore throat. Respiratory: Patient reports shortness of breath.  Denies difficulty breathing, cough or sputum production.   Cardiovascular: Denies chest pain, chest tightness, palpitations or swelling in the hands or feet.  Gastrointestinal: Patient reports constipation.  Denies abdominal pain, bloating, diarrhea or blood in the stool.  GU: Pt reports urgency and frequency. Denies pain with urination, burning sensation, blood in urine, odor or discharge. Musculoskeletal: Patient reports chronic neck and back pain.  Denies decrease in range of motion, difficulty with gait, muscle pain or joint swelling.  Skin: Denies redness, rashes, lesions or ulcercations.  Neurological: Patient  reports neuropathic pain.  Denies dizziness, difficulty with memory, difficulty with speech or problems with balance and coordination.  Psych: Patient has a history of anxiety and depression.  Denies SI/HI.  No other specific complaints in a complete review of systems (except as listed in HPI above).  Objective:   Physical Exam   BP 133/73 (BP Location: Left Arm, Patient Position: Sitting, Cuff Size: Normal)   Pulse 92   Temp (!) 96.2 F (35.7 C) (Temporal)   Wt 229 lb (103.9 kg)   SpO2 97%   BMI 39.31 kg/m   Wt Readings from Last 3 Encounters:  05/15/22 215 lb 2.7 oz (97.6 kg)  03/02/22 220 lb (99.8 kg)  02/10/22 220 lb (99.8 kg)    General: Appears her stated age,obese, chronically ill appearing, in NAD. Skin: Warm, dry and intact. No ulcerations noted. HEENT: Head: normal shape and size; Eyes: sclera white, no icterus, conjunctiva pink, PERRLA and EOMs intact;  Cardiovascular: Normal rate and rhythm. S1,S2 noted.  No murmur, rubs or gallops noted. No JVD or BLE edema. No carotid bruits noted. Pulmonary/Chest: Normal effort and positive vesicular breath sounds. No respiratory distress. No wheezes, rales or ronchi noted.  Abdomen: Soft and nontender. Normal bowel sounds.  Musculoskeletal: Gait slow and steady with use of walker. Neurological: Alert and oriented.  Psychiatric: Mood and affect flat. Behavior is normal. Judgment and thought content normal.    BMET    Component Value Date/Time   NA 143 05/14/2022 0551   NA 141 10/19/2019 1323   K 4.3 05/14/2022 0551   CL 107 05/14/2022 0551   CO2 27 05/14/2022 0551   GLUCOSE 154 (H) 05/14/2022 0551   BUN 12 05/14/2022 0551   BUN 19 10/19/2019 1323   CREATININE 1.04 (H) 05/14/2022 0551   CREATININE 0.94 02/10/2022 1111   CALCIUM 9.1 05/14/2022 0551   GFRNONAA 58 (L) 05/14/2022 0551   GFRAA 59 (L) 08/03/2020 0637    Lipid Panel     Component Value Date/Time   CHOL 68 05/15/2022 0439   TRIG 59 05/15/2022 0439   HDL  32 (L) 05/15/2022 0439   CHOLHDL 2.1 05/15/2022 0439   VLDL 12 05/15/2022 0439   LDLCALC 24 05/15/2022 0439   LDLCALC 26 02/10/2022 1111    CBC    Component Value Date/Time   WBC 8.7 05/14/2022 0551   RBC 4.96 05/14/2022 0551   HGB 12.3 05/14/2022 0551   HCT 41.5 05/14/2022 0551   PLT 182 05/14/2022 0551   MCV 83.7 05/14/2022 0551   MCH 24.8 (L) 05/14/2022 0551   MCHC 29.6 (L) 05/14/2022 0551   RDW 15.9 (H) 05/14/2022 0551   LYMPHSABS 3.0 05/12/2022 1357   MONOABS 0.7 05/12/2022 1357   EOSABS 0.6 (H) 05/12/2022 1357   BASOSABS 0.0 05/12/2022 1357    Hgb A1C Lab Results  Component Value Date   HGBA1C 7.7 (H) 05/14/2022           Assessment & Plan:    RTC in 6 months for your annual exam Webb Silversmith, NP

## 2022-06-30 NOTE — Patient Instructions (Signed)

## 2022-06-30 NOTE — Assessment & Plan Note (Signed)
Avoid foods that trigger reflux Encourage weight loss as this can help reduce reflux symptoms We will change omeprazole to pantoprazole given interaction with Plavix

## 2022-06-30 NOTE — Assessment & Plan Note (Signed)
Encouraged her to consume a low-fat diet Continue rosuvastatin and ezetimibe

## 2022-07-03 ENCOUNTER — Other Ambulatory Visit: Payer: Self-pay | Admitting: Internal Medicine

## 2022-07-03 DIAGNOSIS — E1165 Type 2 diabetes mellitus with hyperglycemia: Secondary | ICD-10-CM

## 2022-07-07 NOTE — Telephone Encounter (Signed)
Requested Prescriptions  Pending Prescriptions Disp Refills  . rosuvastatin (CRESTOR) 10 MG tablet [Pharmacy Med Name: ROSUVASTATIN CALCIUM 10 MG TAB] 90 tablet 0    Sig: Take 1 tablet (10 mg total) by mouth daily.     Cardiovascular:  Antilipid - Statins 2 Failed - 07/03/2022  2:22 PM      Failed - Cr in normal range and within 360 days    Creat  Date Value Ref Range Status  02/10/2022 0.94 0.50 - 1.05 mg/dL Final   Creatinine, Ser  Date Value Ref Range Status  05/14/2022 1.04 (H) 0.44 - 1.00 mg/dL Final   Creatinine, Urine  Date Value Ref Range Status  02/10/2022 43 20 - 275 mg/dL Final         Failed - Lipid Panel in normal range within the last 12 months    Cholesterol  Date Value Ref Range Status  05/15/2022 68 0 - 200 mg/dL Final   LDL Cholesterol (Calc)  Date Value Ref Range Status  02/10/2022 26 mg/dL (calc) Final    Comment:    Reference range: <100 . Desirable range <100 mg/dL for primary prevention;   <70 mg/dL for patients with CHD or diabetic patients  with > or = 2 CHD risk factors. Marland Kitchen LDL-C is now calculated using the Martin-Hopkins  calculation, which is a validated novel method providing  better accuracy than the Friedewald equation in the  estimation of LDL-C.  Cresenciano Genre et al. Annamaria Helling. 5093;267(12): 2061-2068  (http://education.[employer].com/faq/FAQ164)    LDL Cholesterol  Date Value Ref Range Status  05/15/2022 24 0 - 99 mg/dL Final    Comment:           Total Cholesterol/HDL:CHD Risk Coronary Heart Disease Risk Table                     Men   Women  1/2 Average Risk   3.4   3.3  Average Risk       5.0   4.4  2 X Average Risk   9.6   7.1  3 X Average Risk  23.4   11.0        Use the calculated Patient Ratio above and the CHD Risk Table to determine the patient's CHD Risk.        ATP III CLASSIFICATION (LDL):  <100     mg/dL   Optimal  100-129  mg/dL   Near or Above                    Optimal  130-159  mg/dL   Borderline  160-189   mg/dL   High  >190     mg/dL   Very High Performed at [facility], [place], [place] [address]    HDL  Date Value Ref Range Status  05/15/2022 32 (L) >40 mg/dL Final   Triglycerides  Date Value Ref Range Status  05/15/2022 59 <150 mg/dL Final         Passed - Patient is not pregnant      Passed - Valid encounter within last 12 months    Recent Outpatient Visits          1 week ago Uncontrolled type 2 diabetes mellitus with hyperglycemia [employer] [facility])   [facility] [place], Coralie Keens, NP   4 months ago Encounter for general adult medical examination with abnormal findings   [facility], Coralie Keens, NP   6  months ago COPD exacerbation [facility])   [facility] [place], [place], NP   12 months ago Mixed diabetic hyperlipidemia associated with type 2 diabetes mellitus [facility])   [facility] [place], Coralie Keens, NP   1 year ago Uncontrolled type 2 diabetes mellitus with hyperglycemia [facility])   [facility] Olin Hauser, DO      Future Appointments            In 5 months Baity, Coralie Keens, NP [facility], [place]   In 6 months [place], [facility]

## 2022-07-09 DIAGNOSIS — F321 Major depressive disorder, single episode, moderate: Secondary | ICD-10-CM | POA: Diagnosis not present

## 2022-07-09 DIAGNOSIS — F419 Anxiety disorder, unspecified: Secondary | ICD-10-CM | POA: Diagnosis not present

## 2022-07-16 DIAGNOSIS — F32A Depression, unspecified: Secondary | ICD-10-CM | POA: Diagnosis not present

## 2022-07-16 DIAGNOSIS — E1165 Type 2 diabetes mellitus with hyperglycemia: Secondary | ICD-10-CM | POA: Diagnosis not present

## 2022-07-16 DIAGNOSIS — R531 Weakness: Secondary | ICD-10-CM | POA: Diagnosis not present

## 2022-07-16 DIAGNOSIS — J441 Chronic obstructive pulmonary disease with (acute) exacerbation: Secondary | ICD-10-CM | POA: Diagnosis not present

## 2022-07-16 DIAGNOSIS — Z8673 Personal history of transient ischemic attack (TIA), and cerebral infarction without residual deficits: Secondary | ICD-10-CM | POA: Diagnosis not present

## 2022-07-16 DIAGNOSIS — J189 Pneumonia, unspecified organism: Secondary | ICD-10-CM | POA: Diagnosis not present

## 2022-07-16 DIAGNOSIS — M542 Cervicalgia: Secondary | ICD-10-CM | POA: Diagnosis not present

## 2022-07-16 DIAGNOSIS — R252 Cramp and spasm: Secondary | ICD-10-CM | POA: Diagnosis not present

## 2022-07-16 DIAGNOSIS — R296 Repeated falls: Secondary | ICD-10-CM | POA: Diagnosis not present

## 2022-07-16 DIAGNOSIS — I639 Cerebral infarction, unspecified: Secondary | ICD-10-CM | POA: Diagnosis not present

## 2022-07-16 DIAGNOSIS — J9601 Acute respiratory failure with hypoxia: Secondary | ICD-10-CM | POA: Diagnosis not present

## 2022-07-16 DIAGNOSIS — R2689 Other abnormalities of gait and mobility: Secondary | ICD-10-CM | POA: Diagnosis not present

## 2022-07-17 ENCOUNTER — Telehealth: Payer: Self-pay

## 2022-07-17 NOTE — Chronic Care Management (AMB) (Signed)
  Care Coordination Note  07/17/2022 Name: Rachel Nielsen MRN: [id number] DOB: 05-08-1951  Rachel Nielsen is a 71 y.o. year old female who is a primary care patient of Garnette Gunner, Coralie Keens, NP and is actively engaged with the care management team. I reached out to [name] by phone today to assist with re-scheduling a follow up visit with the RN Case Manager  Follow up plan: Telephone appointment with care management team member scheduled for:07/24/2022  Rachel Nielsen, [place] [facility]  [place], [place] [address] Direct Dial: [phone] [email or web address]

## 2022-07-17 NOTE — Chronic Care Management (AMB) (Signed)
  Care Coordination Note  07/17/2022 Name: ALANNAH AVERHART MRN: [id number] DOB: July 28, 1951  ZEBA LUBY is a 71 y.o. year old female who is a primary care patient of Jearld Fenton, NP and is actively engaged with the care management team. I reached out to [name] by phone today to assist with re-scheduling a follow up visit with the RN Case Manager  Follow up plan: Unsuccessful telephone outreach attempt made. A HIPAA compliant phone message was left for the patient providing contact information and requesting a return call.  The care management team will reach out to the patient again over the next 7 days.  If patient returns call to provider office, please advise to call [place]  at [place], [place] [facility]  [place], [place] [address] Direct Dial: [phone] [email or web address]

## 2022-07-18 DIAGNOSIS — E1165 Type 2 diabetes mellitus with hyperglycemia: Secondary | ICD-10-CM | POA: Diagnosis not present

## 2022-07-24 ENCOUNTER — Ambulatory Visit: Payer: Self-pay

## 2022-07-24 NOTE — Patient Instructions (Signed)
Visit Information  Thank you for taking time to visit with me today. Please don't hesitate to contact me if I can be of assistance to you.   Following are the goals we discussed today:   Goals Addressed             This Visit's Progress    RNCM: Effective Management of post CVA       Care Coordination Interventions: Evaluation of current treatment plan related to recent CVA with memory changes and patient's adherence to plan as established by provider. The patient saw the neurologist recently and the pcp. More PT and OT are going to be ordered.  Advised patient to call the office for changes in conditions, questions, or worsening sx and sx of memory problems- is seeing neurologist also Provided education to patient re: pacing activity, staying hydrated, eating balanced meals, taking medications as directed, working with PT, calling the office for assistance with new concerns. Review with the patient. Reviewed medications with patient and discussed compliance. Working with the pharm D. Collaborated with pharm D  regarding the patient stating she forgot what she was supposed to tell pcp on her upcoming visit that she is having some memory concerns. Education and support given. Have spoke to the pharm D and message delivered.  Provided patient with fall and safety educational materials related to high risk for falls and safety concerns Reviewed scheduled/upcoming provider appointments including 2-29-2023 at 11 am Pharmacy referral for assistance with medications. The patient is already working with the patient for assistance with medications and education needs.  Discussed plans with patient for ongoing care management follow up and provided patient with direct contact information for care management team Advised patient to discuss changes in her health and well being, questions, or concerns she may have with provider Screening for signs and symptoms of depression related to chronic disease  state  Assessed social determinant of health barriers Review of falls prevention and safety. The patient had a fall recently but she states she did not get injured because she landed on her butt. Review of safety concerns and fall prevention.           Our next appointment is by telephone on 10-16-2022 at 1 pm  Please call the care guide team at [phone] if you need to cancel or reschedule your appointment.   If you are experiencing a Mental Health or [place] or need someone to talk to, please call the Suicide and Crisis Lifeline: 988 call the [place] National Suicide Prevention Lifeline: 216-381-4403 or TTY: 806 831 7571 TTY 909-544-2385) to talk to a trained counselor call 1-800-273-TALK (toll free, 24 hour hotline)  Patient verbalizes understanding of instructions and care plan provided today and agrees to view in [place]. Active MyChart status and patient understanding of how to access instructions and care plan via MyChart confirmed with patient.     Telephone follow up appointment with care management team member scheduled for: 10-16-2022 at 1 pm  [place], MSN, [place]  Mobile: [phone]

## 2022-07-24 NOTE — Patient Outreach (Signed)
  Care Coordination   Follow Up Visit Note   07/24/2022 Name: Rachel Nielsen MRN: [id number] DOB: 07-28-1951  Rachel Nielsen is a 71 y.o. year old female who sees Baity, Coralie Keens, NP for primary care. I spoke with  [name] by phone today.  What matters to the patients health and wellness today?  She is able to be mobile and not have any further complications from her past stroke    Goals Addressed             This Visit's Progress    RNCM: Effective Management of post CVA       Care Coordination Interventions: Evaluation of current treatment plan related to recent CVA with memory changes and patient's adherence to plan as established by provider. The patient saw the neurologist recently and the pcp. More PT and OT are going to be ordered.  Advised patient to call the office for changes in conditions, questions, or worsening sx and sx of memory problems- is seeing neurologist also Provided education to patient re: pacing activity, staying hydrated, eating balanced meals, taking medications as directed, working with PT, calling the office for assistance with new concerns. Review with the patient. Reviewed medications with patient and discussed compliance. Working with the pharm D. Collaborated with pharm D  regarding the patient stating she forgot what she was supposed to tell pcp on her upcoming visit that she is having some memory concerns. Education and support given. Have spoke to the pharm D and message delivered.  Provided patient with fall and safety educational materials related to high risk for falls and safety concerns Reviewed scheduled/upcoming provider appointments including 2-29-2023 at 11 am Pharmacy referral for assistance with medications. The patient is already working with the patient for assistance with medications and education needs.  Discussed plans with patient for ongoing care management follow up and provided patient with direct contact information for care  management team Advised patient to discuss changes in her health and well being, questions, or concerns she may have with provider Screening for signs and symptoms of depression related to chronic disease state  Assessed social determinant of health barriers Review of falls prevention and safety. The patient had a fall recently but she states she did not get injured because she landed on her butt. Review of safety concerns and fall prevention.           SDOH assessments and interventions completed:  Yes  SDOH Interventions Today    Flowsheet Row Most Recent Value  SDOH Interventions   Utilities Interventions Intervention Not Indicated        Care Coordination Interventions Activated:  Yes  Care Coordination Interventions:  Yes, provided   Follow up plan: Follow up call scheduled for 10-16-2022 at 1 pm    Encounter Outcome:  Pt. Visit Completed   Noreene Larsson RN, MSN, [place]  Mobile: [phone]

## 2022-08-03 ENCOUNTER — Other Ambulatory Visit: Payer: Self-pay | Admitting: Internal Medicine

## 2022-08-03 DIAGNOSIS — I69322 Dysarthria following cerebral infarction: Secondary | ICD-10-CM | POA: Diagnosis not present

## 2022-08-03 DIAGNOSIS — I69351 Hemiplegia and hemiparesis following cerebral infarction affecting right dominant side: Secondary | ICD-10-CM | POA: Diagnosis not present

## 2022-08-03 DIAGNOSIS — M5412 Radiculopathy, cervical region: Secondary | ICD-10-CM | POA: Diagnosis not present

## 2022-08-03 DIAGNOSIS — E119 Type 2 diabetes mellitus without complications: Secondary | ICD-10-CM | POA: Diagnosis not present

## 2022-08-03 DIAGNOSIS — I10 Essential (primary) hypertension: Secondary | ICD-10-CM

## 2022-08-03 DIAGNOSIS — J449 Chronic obstructive pulmonary disease, unspecified: Secondary | ICD-10-CM | POA: Diagnosis not present

## 2022-08-03 DIAGNOSIS — I69354 Hemiplegia and hemiparesis following cerebral infarction affecting left non-dominant side: Secondary | ICD-10-CM | POA: Diagnosis not present

## 2022-08-04 NOTE — Telephone Encounter (Signed)
Requested Prescriptions  Pending Prescriptions Disp Refills  . fluticasone (FLONASE) 50 MCG/ACT nasal spray [Pharmacy Med Name: FLUTICASONE PROP 50 MCG SPRAY] 16 g 0    Sig: Place 1 spray into both nostrils daily.     Ear, Nose, and Throat: Nasal Preparations - Corticosteroids Passed - 08/03/2022 12:22 PM      Passed - Valid encounter within last 12 months    Recent Outpatient Visits          1 month ago Uncontrolled type 2 diabetes mellitus with hyperglycemia [facility])   [facility], Coralie Keens, NP   5 months ago Encounter for general adult medical examination with abnormal findings   [facility] [place], Coralie Keens, NP   7 months ago COPD exacerbation [facility])   [facility] [place], Coralie Keens, NP   1 year ago Mixed diabetic hyperlipidemia associated with type 2 diabetes mellitus [facility])   [facility], NP   1 year ago Uncontrolled type 2 diabetes mellitus with hyperglycemia [place])   [facility] Olin Hauser, DO      Future Appointments            In 4 months Baity, Coralie Keens, NP [facility], [place]   In 5 months MacDiarmid, Nicki Reaper, [facility]           . bisoprolol (ZEBETA) 10 MG tablet [Pharmacy Med Name: BISOPROLOL FUMARATE 10 MG TAB] 90 tablet 0    Sig: Take 1 tablet (10 mg total) by mouth daily. OFFICE VISIT NEEDED FOR ADDITIONAL REFILLS     Cardiovascular: Beta Blockers 2 Failed - 08/03/2022 12:22 PM      Failed - Cr in normal range and within 360 days    Creat  Date Value Ref Range Status  02/10/2022 0.94 0.50 - 1.05 mg/dL Final   Creatinine, Ser  Date Value Ref Range Status  05/14/2022 1.04 (H) 0.44 - 1.00 mg/dL Final   Creatinine, Urine  Date Value Ref Range Status  02/10/2022 43 20 - 275 mg/dL Final         Passed - Last BP in normal range    BP Readings from Last 1 Encounters:  06/30/22 133/73         Passed - Last Heart Rate  in normal range    Pulse Readings from Last 1 Encounters:  06/30/22 92         Passed - Valid encounter within last 6 months    Recent Outpatient Visits          1 month ago Uncontrolled type 2 diabetes mellitus with hyperglycemia ([place])   [facility], Coralie Keens, NP   5 months ago Encounter for general adult medical examination with abnormal findings   [facility] [place], Coralie Keens, NP   7 months ago COPD exacerbation [facility])   [facility] [place], Coralie Keens, NP   1 year ago Mixed diabetic hyperlipidemia associated with type 2 diabetes mellitus [facility])   [facility] [place], Coralie Keens, NP   1 year ago Uncontrolled type 2 diabetes mellitus with hyperglycemia [facility])   [facility] Olin Hauser, DO      Future Appointments            In 4 months Baity, Coralie Keens, NP [facility], [place]   In 5 months Bjorn Loser, MD  [place]

## 2022-08-05 DIAGNOSIS — Z0100 Encounter for examination of eyes and vision without abnormal findings: Secondary | ICD-10-CM | POA: Diagnosis not present

## 2022-08-05 DIAGNOSIS — E119 Type 2 diabetes mellitus without complications: Secondary | ICD-10-CM | POA: Diagnosis not present

## 2022-08-05 LAB — HM DIABETES EYE EXAM

## 2022-08-18 DIAGNOSIS — Z79899 Other long term (current) drug therapy: Secondary | ICD-10-CM | POA: Diagnosis not present

## 2022-08-18 DIAGNOSIS — F3189 Other bipolar disorder: Secondary | ICD-10-CM | POA: Diagnosis not present

## 2022-08-18 DIAGNOSIS — F4310 Post-traumatic stress disorder, unspecified: Secondary | ICD-10-CM | POA: Diagnosis not present

## 2022-08-27 ENCOUNTER — Other Ambulatory Visit: Payer: Self-pay | Admitting: Internal Medicine

## 2022-08-27 ENCOUNTER — Ambulatory Visit: Payer: Medicare HMO

## 2022-08-27 DIAGNOSIS — J432 Centrilobular emphysema: Secondary | ICD-10-CM

## 2022-08-27 DIAGNOSIS — I10 Essential (primary) hypertension: Secondary | ICD-10-CM

## 2022-08-27 DIAGNOSIS — E1165 Type 2 diabetes mellitus with hyperglycemia: Secondary | ICD-10-CM

## 2022-08-27 DIAGNOSIS — N1831 Chronic kidney disease, stage 3a: Secondary | ICD-10-CM

## 2022-08-31 ENCOUNTER — Other Ambulatory Visit: Payer: Self-pay | Admitting: Internal Medicine

## 2022-09-01 DIAGNOSIS — E669 Obesity, unspecified: Secondary | ICD-10-CM | POA: Diagnosis not present

## 2022-09-01 DIAGNOSIS — Z794 Long term (current) use of insulin: Secondary | ICD-10-CM | POA: Diagnosis not present

## 2022-09-01 DIAGNOSIS — Z8673 Personal history of transient ischemic attack (TIA), and cerebral infarction without residual deficits: Secondary | ICD-10-CM | POA: Diagnosis not present

## 2022-09-01 DIAGNOSIS — E1169 Type 2 diabetes mellitus with other specified complication: Secondary | ICD-10-CM | POA: Diagnosis not present

## 2022-09-01 DIAGNOSIS — E1142 Type 2 diabetes mellitus with diabetic polyneuropathy: Secondary | ICD-10-CM | POA: Diagnosis not present

## 2022-09-01 NOTE — Telephone Encounter (Signed)
Requested Prescriptions  Pending Prescriptions Disp Refills  . fluticasone (FLONASE) 50 MCG/ACT nasal spray [Pharmacy Med Name: FLUTICASONE PROP 50 MCG SPRAY] 16 g 0    Sig: Place 1 spray into both nostrils daily.     Ear, Nose, and Throat: Nasal Preparations - Corticosteroids Passed - 08/31/2022 12:14 PM      Passed - Valid encounter within last 12 months    Recent Outpatient Visits          2 months ago Uncontrolled type 2 diabetes mellitus with hyperglycemia [facility])   [facility], Coralie Keens, NP   6 months ago Encounter for general adult medical examination with abnormal findings   [place], NP   7 months ago COPD exacerbation [facility])   [facility] [place], Coralie Keens, NP   1 year ago Mixed diabetic hyperlipidemia associated with type 2 diabetes mellitus [facility])   [facility], NP   1 year ago Uncontrolled type 2 diabetes mellitus with hyperglycemia [facility])   [facility] Olin Hauser, DO      Future Appointments            In 4 months Baity, Coralie Keens, NP [facility], [place]   In 4 months MacDiarmid, Nicki Reaper, [place]

## 2022-09-10 DIAGNOSIS — M3501 Sicca syndrome with keratoconjunctivitis: Secondary | ICD-10-CM | POA: Diagnosis not present

## 2022-09-18 DIAGNOSIS — H402232 Chronic angle-closure glaucoma, bilateral, moderate stage: Secondary | ICD-10-CM | POA: Diagnosis not present

## 2022-09-21 ENCOUNTER — Ambulatory Visit (INDEPENDENT_AMBULATORY_CARE_PROVIDER_SITE_OTHER): Payer: Medicare HMO | Admitting: Pharmacist

## 2022-09-21 DIAGNOSIS — E1165 Type 2 diabetes mellitus with hyperglycemia: Secondary | ICD-10-CM

## 2022-09-21 NOTE — Patient Instructions (Signed)
Visit Information  Thank you for taking time to visit with me today. Please don't hesitate to contact me if I can be of assistance to you before our next scheduled telephone appointment.  Following are the goals we discussed today:   Goals Addressed             This Visit's Progress    Pharmacy Goals       Our goal A1c is less than 7%. This corresponds with fasting sugars less than 130 and 2 hour after meal sugars less than 180. Please check your blood sugar and keep a log of the results  Our goal bad cholesterol, or LDL, is less than 70 . This is why it is important to continue taking your rosuvastatin.  Feel free to call me with any questions or concerns. I look forward to our next call!   Wallace Cullens, PharmD, [place] [phone]         Our next appointment is by telephone on 10/07/2022 at 2:15 PM  Please call the care guide team at [phone] if you need to cancel or reschedule your appointment.    Patient verbalizes understanding of instructions and care plan provided today and agrees to view in [place]. Active MyChart status and patient understanding of how to access instructions and care plan via MyChart confirmed with patient.

## 2022-09-21 NOTE — Chronic Care Management (AMB) (Signed)
Chronic Care Management CCM Pharmacy Note  09/21/2022 Name:  Rachel Nielsen MRN:  [id number] DOB:  1951-05-24   Subjective: Rachel Nielsen is an 71 y.o. year old female who is a primary patient of Jearld Fenton, NP.  The CCM team was consulted for assistance with disease management and care coordination needs.    Engaged with patient by telephone for follow up visit for pharmacy case management and/or care coordination services.   Objective:  Medications Reviewed Today     Reviewed by Vanita Ingles, RN (Case Manager) on 07/24/22 at 1442  Med List Status: <None>   Medication Order Taking? Sig Documenting Provider Last Dose Status Informant  ACCU-CHEK GUIDE test strip [id number]  Use to check blood sugar up to twice a day as directed Jearld Fenton, NP  Active   Accu-Chek Softclix Lancets lancets [id number]  Use to check blood sugar up to twice a day as directed Jearld Fenton, NP  Active   albuterol (PROVENTIL) (2.5 MG/3ML) 0.083% nebulizer solution [id number] No Inhale 3 mLs (2.5 mg total) into the lungs every 4 (four) hours as needed for wheezing or shortness of breath.  Patient taking differently: Inhale 2.5 mg into the lungs every 6 (six) hours as needed for wheezing or shortness of breath.   Lorella Nimrod, MD Taking Active Self, Multiple Informants  albuterol (VENTOLIN HFA) 108 (90 Base) MCG/ACT inhaler [id number] No Inhale 2-4 puffs by mouth every 4 hours as needed for wheezing, cough, and/or shortness of breath  Patient taking differently: 2 puffs every 6 (six) hours as needed.   Malfi, Lupita Raider, FNP Taking Active Multiple Informants, Self  ALPRAZolam Duanne Moron) 1 MG tablet [id number] No Take 1 mg by mouth 3 (three) times daily as needed for anxiety or sleep. [provider] Taking Active Self  amLODipine (NORVASC) 5 MG tablet [id number] No Take 1 tablet (5 mg total) by mouth daily. Jearld Fenton, NP Taking Active Self, Multiple Informants  Asenapine Maleate 10 MG SUBL  [id number] No Place 5 mg under the tongue at bedtime. [provider] Taking Active Multiple Informants, Self  aspirin 81 MG EC tablet [id number] No Take 1 tablet (81 mg total) by mouth daily. Swallow whole. Jearld Fenton, NP Taking Active Self, Multiple Informants  bisoprolol (ZEBETA) 10 MG tablet [id number] No Take 1 tablet (10 mg total) by mouth daily. OFFICE VISIT NEEDED FOR ADDITIONAL REFILLS Jearld Fenton, NP Taking Active Self, Multiple Informants  buPROPion (WELLBUTRIN XL) 300 MG 24 hr tablet [id number] No Take 1 tablet (300 mg total) by mouth daily. Carrie Mew, MD Taking Active Multiple Informants, Self  busPIRone (BUSPAR) 15 MG tablet [id number] No Take 15 mg by mouth 2 (two) times daily. [provider] Taking Active Self  clopidogrel (PLAVIX) 75 MG tablet [id number] No Take 1 tablet (75 mg total) by mouth daily. Barb Merino, MD Taking Active   docusate sodium (COLACE) 100 MG capsule [id number] No Take 100 mg by mouth daily. [provider] Taking Active Self, Multiple Informants  Dulaglutide 3 MG/0.5ML SOPN [id number] No Inject 0.5 mLs (3 mg total) subcutaneously once a week [provider] Taking Active Self, Multiple Informants  famotidine (PEPCID) 20 MG tablet [id number] No Take 20 mg by mouth 2 (two) times daily. [provider] Taking Active Multiple Informants, Self  FARXIGA 10 MG TABS tablet [id number] No Take 10 mg by mouth daily. [provider] Taking Active Multiple Informants, Self  fluticasone (FLONASE) 50 MCG/ACT nasal spray  [id number] No Place 1 spray into both nostrils daily. Jearld Fenton, NP Taking Active Self, Multiple Informants  fluticasone-salmeterol (ADVAIR) 250-50 MCG/ACT AEPB [id number] No Inhale 1 puff into the lungs 2 (two) times daily. [provider] Taking Active Multiple Informants, Self  gabapentin (NEURONTIN) 600 MG tablet [id number] No Take 600-900 mg by mouth 3 (three) times daily. 600 mg AM,  600 mg pm and 900 mg at bedtime [provider] Taking Active Multiple Informants, Self  GLOBAL EASE INJECT PEN NEEDLES 32G X 4 MM MISC [id number] No Use as directed with insulin Kendell Bane, NP Taking Active Multiple Informants, Self  insulin aspart (NOVOLOG) 100 UNIT/ML FlexPen [id number] No Inject 20 Units into the skin 3 (three) times daily with meals. [provider] Taking Active Self, Multiple Informants  Insulin Glargine Solostar (LANTUS) 100 UNIT/ML Solostar Pen [id number] No Inject 66 Units subcutaneously at bedtime [provider] Taking Active Self, Multiple Informants  Insulin Pen Needle (BD PEN NEEDLE NANO U/F) 32G X 4 MM MISC [id number] No Inject 1 Device into the skin daily. Jearld Fenton, NP Taking Active Multiple Informants, Self  Insulin Syringe-Needle U-100 (INSULIN SYRINGE 1CC/31GX5/16") 31G X 5/16" 1 ML MISC [id number] No 1 Syringe by Does not apply route in the morning, at noon, in the evening, and at bedtime. Jearld Fenton, NP Taking Active Multiple Informants, Self  LINZESS 290 MCG CAPS capsule [id number] No Take 1 capsule (290 mcg total) by mouth daily before breakfast. Jearld Fenton, NP Taking Active Self, Multiple Informants  lurasidone (LATUDA) 20 MG TABS tablet [id number] No Take 20 mg by mouth daily. [provider] Taking Active Self  metFORMIN (GLUCOPHAGE) 1000 MG tablet [id number] No Take 1,000 mg by mouth 2 (two) times daily with a meal. [provider] Taking Active Self, Multiple Informants  mirabegron ER (MYRBETRIQ) 50 MG TB24 tablet [id number] No Take 1 tablet (50 mg total) by mouth daily. Bjorn Loser, MD Taking Active Self, Multiple Informants  montelukast (SINGULAIR) 10 MG tablet [id number] No Take 1 tablet (10 mg total) by mouth daily. Carrie Mew, MD Taking Active Multiple Informants, Self  Olopatadine HCl 0.2 % SOLN [id number] No Place 1 drop into both eyes daily. Malfi, Lupita Raider, FNP Taking Active  Multiple Informants, Self  pantoprazole (PROTONIX) 40 MG tablet [id number]  Take 1 tablet (40 mg total) by mouth 2 (two) times daily. Jearld Fenton, NP  Active   pregabalin (LYRICA) 100 MG capsule [id number] No Take 100 mg by mouth 3 (three) times daily. [provider] Taking Active Self, Multiple Informants  rosuvastatin (CRESTOR) 10 MG tablet [id number]  Take 1 tablet (10 mg total) by mouth daily. Jearld Fenton, NP  Active   TRAVATAN Z 0.004 % SOLN ophthalmic solution [id number] No Place 1 drop into both eyes at bedtime. Carrie Mew, MD Taking Active Multiple Informants, Self            Pertinent Labs:  Lab Results  Component Value Date   HGBA1C 7.1 (A) 06/30/2022   Lab Results  Component Value Date   CHOL 68 05/15/2022   HDL 32 (L) 05/15/2022   LDLCALC 24 05/15/2022   TRIG 59 05/15/2022   CHOLHDL 2.1 05/15/2022   Lab Results  Component Value Date   CREATININE 1.04 (H) 05/14/2022   BUN 12 05/14/2022   NA 143 05/14/2022   K 4.3 05/14/2022   CL 107 05/14/2022   CO2 27 05/14/2022    SDOH:  (Social Determinants of Health)  assessments and interventions performed:  SDOH Interventions    Flowsheet Row Care Coordination from 07/24/2022 in [place] from 06/22/2022 in [place] Management from 01/12/2022 in [place] from 12/18/2021 in [facility] Office Visit from 07/09/2021 in [place] from 12/31/2020 in [place]  SDOH Interventions        Food Insecurity Interventions -- Intervention Not Indicated Intervention Not Indicated Intervention Not Indicated -- --  Housing Interventions -- Intervention Not Indicated Intervention Not Indicated Intervention Not Indicated -- --  Transportation Interventions -- Intervention Not Indicated Intervention Not Indicated  Intervention Not Indicated -- --  Utilities Interventions Intervention Not Indicated -- -- -- -- --  Depression Interventions/Treatment  -- -- -- Currently on Treatment, Counseling Currently on Treatment Counseling, Currently on Treatment  Financial Strain Interventions -- -- Intervention Not Indicated Intervention Not Indicated -- --  Physical Activity Interventions -- -- Other (Comments)  [has an exercise bike but has not been using it] -- -- --  Stress Interventions -- -- Intervention Not Indicated -- -- --  Social Connections Interventions -- -- Other (Comment)  [has good support system. 4 children] Intervention Not Indicated -- --       CCM Care Plan  Review of patient past medical history, allergies, medications, health status, including review of consultants reports, laboratory and other test data, was performed as part of comprehensive evaluation and provision of chronic care management services.   Care Plan : PharmD - Medication Management/T2DM  Updates made by Rennis Petty, RPH-CPP since 09/21/2022 12:00 AM     Problem: Disease Progression      Long-Range Goal: Disease Progression Prevented or Minimized   Start Date: 01/19/2022  Expected End Date: 04/19/2022  Priority: High  Note:   Current Barriers:  Unable to achieve control of blood sugar  Chronic Disease Management support and education needs related to managing HTN, GERD, major depressive disorder, Type II DM, history of CVA, diabetic neuropathy, GAD, OSA, HLD, constipation, obesity, COPD  Pharmacist Clinical Goal(s):  patient will achieve adherence to monitoring guidelines and medication adherence to achieve therapeutic efficacy achieve control of blood sugar as evidenced by A1C  through collaboration with PharmD and provider.    Interventions: 1:1 collaboration with Jearld Fenton, NP regarding development and update of comprehensive plan of care as evidenced by provider attestation and  co-signature Inter-disciplinary care team collaboration (see longitudinal plan of care) Perform chart review. Patient seen for Office Visit with [facility] Endocrinology on 10/31. Provider advised patient: Check your sugars three times daily. Best time to check sugars are before meals and at bedtime Ordered a new FreeStyle Libre 2 reader and sensor to [facility] mail order pharmacy  Medication Adherence: Patient uses weekly pillbox to organize her medications/as adherence aid Have encouraged patient to also use latest medication list as aid when filling weekly pillbox to ensure all current medications are included  T2DM: Patient followed by [facility] End[facility] Current treatment: Metformin 1000 mg twice daily Trulicity 3 mg weekly on Fridays Lantus 66 units nightly Novolog 20 units three times daily before meals Farxiga 10 mg daily Today reports in the middle of the night one day last week son called EMS after he was unable to wake her and blood sugar reading of 51 Reports she was given glucose via IV by [facility] that since this low  reading, she has been concerned about hypoglycemia and monitoring her blood sugar more closely. Reports she has also sometimes been skipping doses of Novolog (if blood sugar close to 100 prior to a meal) or skipping Lantus dose overnight Denies further episodes of hypoglycemia; confirms carries glucose tablets with her Also expresses concern about maintaining control of her blood sugar as does not want to have to postpone upcoming appointment for steroid injection for her neck on 11/28 Advise patient to contact Endocrinology office today to let provider know about episode of hypoglycemia last week and for guidance regarding her insulin adjustment Phone number provided CM Pharmacist also outreaches to [facility] Endocrinology today to collaborate regarding patient. Speak with Santiago Glad in the office who confirms patient has just  called to provide same information and message will be sent to provider and call returned to patient. Statin: rosuvastatin 10 mg daily Reports has not yet restarted using Freestyle Libre 2 Continuous Glucose Monitor (CGM) reader and sensors, but reports received message from [facility] that she would receive a new CGM reader and sensors within the next few weeks Patient monitoring with glucometer while waiting to restart using CGM Encourage patient to monitor home blood as directed by Endocrinologist and to follow up with clinic for readings outside of established parameters or new symptoms   Patient Goals/Self-Care Activities patient will:  - take medications as prescribed as evidenced by patient report and record review - check glucose, document, and provide at future appointments - check blood pressure, document, and provide at future appointments       Plan: The care management team will reach out to the patient again over the next 30 days.  Wallace Cullens, PharmD, [place] [phone]

## 2022-09-28 ENCOUNTER — Other Ambulatory Visit: Payer: Self-pay | Admitting: Internal Medicine

## 2022-09-28 DIAGNOSIS — R252 Cramp and spasm: Secondary | ICD-10-CM | POA: Diagnosis not present

## 2022-09-28 DIAGNOSIS — M542 Cervicalgia: Secondary | ICD-10-CM | POA: Diagnosis not present

## 2022-09-28 DIAGNOSIS — R200 Anesthesia of skin: Secondary | ICD-10-CM | POA: Diagnosis not present

## 2022-09-28 DIAGNOSIS — E1165 Type 2 diabetes mellitus with hyperglycemia: Secondary | ICD-10-CM | POA: Diagnosis not present

## 2022-09-28 DIAGNOSIS — R531 Weakness: Secondary | ICD-10-CM | POA: Diagnosis not present

## 2022-09-28 DIAGNOSIS — Z8673 Personal history of transient ischemic attack (TIA), and cerebral infarction without residual deficits: Secondary | ICD-10-CM | POA: Diagnosis not present

## 2022-09-28 DIAGNOSIS — M545 Low back pain, unspecified: Secondary | ICD-10-CM | POA: Diagnosis not present

## 2022-09-28 DIAGNOSIS — I639 Cerebral infarction, unspecified: Secondary | ICD-10-CM | POA: Diagnosis not present

## 2022-09-28 DIAGNOSIS — R2689 Other abnormalities of gait and mobility: Secondary | ICD-10-CM | POA: Diagnosis not present

## 2022-09-29 NOTE — Telephone Encounter (Signed)
Requested Prescriptions  Pending Prescriptions Disp Refills   fluticasone (FLONASE) 50 MCG/ACT nasal spray [Pharmacy Med Name: FLUTICASONE PROP 50 MCG SPRAY] 16 g 0    Sig: Place 1 spray into both nostrils daily.     Ear, Nose, and Throat: Nasal Preparations - Corticosteroids Passed - 09/28/2022  3:03 PM      Passed - Valid encounter within last 12 months    Recent Outpatient Visits           3 months ago Uncontrolled type 2 diabetes mellitus with hyperglycemia [facility])   [facility], Coralie Keens, NP   7 months ago Encounter for general adult medical examination with abnormal findings   [place], NP   8 months ago COPD exacerbation [facility])   [facility] [place], Coralie Keens, NP   1 year ago Mixed diabetic hyperlipidemia associated with type 2 diabetes mellitus [facility])   [facility], NP   1 year ago Uncontrolled type 2 diabetes mellitus with hyperglycemia [facility])   [facility] Olin Hauser, DO       Future Appointments             In 3 months Baity, Coralie Keens, NP [facility], [place]   In 3 months MacDiarmid, Nicki Reaper, [place]

## 2022-10-01 DIAGNOSIS — E1165 Type 2 diabetes mellitus with hyperglycemia: Secondary | ICD-10-CM

## 2022-10-05 ENCOUNTER — Other Ambulatory Visit: Payer: Self-pay | Admitting: Internal Medicine

## 2022-10-05 DIAGNOSIS — E1165 Type 2 diabetes mellitus with hyperglycemia: Secondary | ICD-10-CM

## 2022-10-06 NOTE — Telephone Encounter (Signed)
Requested Prescriptions  Pending Prescriptions Disp Refills   rosuvastatin (CRESTOR) 10 MG tablet [Pharmacy Med Name: ROSUVASTATIN CALCIUM 10 MG TAB] 90 tablet 0    Sig: Take 1 tablet (10 mg total) by mouth daily.     Cardiovascular:  Antilipid - Statins 2 Failed - 10/05/2022  1:36 PM      Failed - Cr in normal range and within 360 days    Creat  Date Value Ref Range Status  02/10/2022 0.94 0.50 - 1.05 mg/dL Final   Creatinine, Ser  Date Value Ref Range Status  05/14/2022 1.04 (H) 0.44 - 1.00 mg/dL Final   Creatinine, Urine  Date Value Ref Range Status  02/10/2022 43 20 - 275 mg/dL Final         Failed - Lipid Panel in normal range within the last 12 months    Cholesterol  Date Value Ref Range Status  05/15/2022 68 0 - 200 mg/dL Final   LDL Cholesterol (Calc)  Date Value Ref Range Status  02/10/2022 26 mg/dL (calc) Final    Comment:    Reference range: <100 . Desirable range <100 mg/dL for primary prevention;   <70 mg/dL for patients with CHD or diabetic patients  with > or = 2 CHD risk factors. Marland Kitchen LDL-C is now calculated using the Martin-Hopkins  calculation, which is a validated novel method providing  better accuracy than the Friedewald equation in the  estimation of LDL-C.  Cresenciano Genre et al. Annamaria Helling. 6734;193(79): 2061-2068  (http://education.[employer].com/faq/FAQ164)    LDL Cholesterol  Date Value Ref Range Status  05/15/2022 24 0 - 99 mg/dL Final    Comment:           Total Cholesterol/HDL:CHD Risk Coronary Heart Disease Risk Table                     Men   Women  1/2 Average Risk   3.4   3.3  Average Risk       5.0   4.4  2 X Average Risk   9.6   7.1  3 X Average Risk  23.4   11.0        Use the calculated Patient Ratio above and the CHD Risk Table to determine the patient's CHD Risk.        ATP III CLASSIFICATION (LDL):  <100     mg/dL   Optimal  100-129  mg/dL   Near or Above                    Optimal  130-159  mg/dL   Borderline  160-189   mg/dL   High  >190     mg/dL   Very High Performed at [facility], [place], [place] [address]    HDL  Date Value Ref Range Status  05/15/2022 32 (L) >40 mg/dL Final   Triglycerides  Date Value Ref Range Status  05/15/2022 59 <150 mg/dL Final         Passed - Patient is not pregnant      Passed - Valid encounter within last 12 months    Recent Outpatient Visits           3 months ago Uncontrolled type 2 diabetes mellitus with hyperglycemia [facility])   [facility], Coralie Keens, NP   7 months ago Encounter for general adult medical examination with abnormal findings   [facility] [place], Coralie Keens, NP  9 months ago COPD exacerbation [facility])   [facility] [place], [place], NP   1 year ago Mixed diabetic hyperlipidemia associated with type 2 diabetes mellitus [facility])   [facility] [place], Coralie Keens, NP   1 year ago Uncontrolled type 2 diabetes mellitus with hyperglycemia [facility] [place])   [facility] Olin Hauser, DO       Future Appointments             In 2 months Baity, Coralie Keens, NP [facility], [place]   In 3 months MacDiarmid, Nicki Reaper, MD [facility] [place]             amLODipine ([place]) 5 MG tablet [Pharmacy Med Name: AMLODIPINE BESYLATE 5 MG TAB] 90 tablet 0    Sig: Take 1 tablet (5 mg total) by mouth daily.     Cardiovascular: Calcium Channel Blockers 2 Passed - 10/05/2022  1:36 PM      Passed - Last BP in normal range    BP Readings from Last 1 Encounters:  06/30/22 133/73         Passed - Last Heart Rate in normal range    Pulse Readings from Last 1 Encounters:  06/30/22 92         Passed - Valid encounter within last 6 months    Recent Outpatient Visits           3 months ago Uncontrolled type 2 diabetes mellitus with hyperglycemia ([place])   [facility], Coralie Keens, NP   7 months ago Encounter for general  adult medical examination with abnormal findings   [facility] [place], Coralie Keens, NP   9 months ago COPD exacerbation [facility])   [facility] [place], Coralie Keens, NP   1 year ago Mixed diabetic hyperlipidemia associated with type 2 diabetes mellitus [facility])   [facility] [place], Coralie Keens, NP   1 year ago Uncontrolled type 2 diabetes mellitus with hyperglycemia [facility])   [facility] Olin Hauser, DO       Future Appointments             In 2 months Baity, Coralie Keens, NP [facility] [place], [place]   In 3 months MacDiarmid, Nicki Reaper, [place]

## 2022-10-07 ENCOUNTER — Ambulatory Visit (INDEPENDENT_AMBULATORY_CARE_PROVIDER_SITE_OTHER): Payer: Medicare HMO | Admitting: Pharmacist

## 2022-10-07 DIAGNOSIS — E1165 Type 2 diabetes mellitus with hyperglycemia: Secondary | ICD-10-CM

## 2022-10-07 NOTE — Chronic Care Management (AMB) (Signed)
Chronic Care Management CCM Pharmacy Note  10/07/2022 Name:  Rachel Nielsen MRN:  [id number] DOB:  [birth year]   Subjective: Rachel Nielsen is an 71 y.o. year old female who is a primary patient of Jearld Fenton, NP.  The CCM team was consulted for assistance with disease management and care coordination needs.    Engaged with patient by telephone for follow up visit for pharmacy case management and/or care coordination services.   Objective:  Medications Reviewed Today     Reviewed by Rennis Petty, RPH-CPP (Pharmacist) on 09/21/22 at 1221  Med List Status: <None>   Medication Order Taking? Sig Documenting Provider Last Dose Status Informant  ACCU-CHEK GUIDE test strip [id number]  Use to check blood sugar up to twice a day as directed Jearld Fenton, NP  Active   Accu-Chek Softclix Lancets lancets [id number]  Use to check blood sugar up to twice a day as directed Jearld Fenton, NP  Active   albuterol (PROVENTIL) (2.5 MG/3ML) 0.083% nebulizer solution [id number]  Inhale 3 mLs (2.5 mg total) into the lungs every 4 (four) hours as needed for wheezing or shortness of breath.  Patient taking differently: Inhale 2.5 mg into the lungs every 6 (six) hours as needed for wheezing or shortness of breath.   Lorella Nimrod, MD  Active Self, Multiple Informants  albuterol (VENTOLIN HFA) 108 (90 Base) MCG/ACT inhaler [id number]  Inhale 2-4 puffs by mouth every 4 hours as needed for wheezing, cough, and/or shortness of breath  Patient taking differently: 2 puffs every 6 (six) hours as needed.   Verl Bangs, FNP  Active Multiple Informants, Self  ALPRAZolam Duanne Moron) 1 MG tablet [id number]  Take 1 mg by mouth 3 (three) times daily as needed for anxiety or sleep. [provider]  Active Self  amLODipine (NORVASC) 5 MG tablet [id number]  Take 1 tablet (5 mg total) by mouth daily. Jearld Fenton, NP  Active Self, Multiple Informants  Asenapine Maleate 10 MG SUBL [id number]  Place 5 mg under  the tongue at bedtime. [provider]  Active Multiple Informants, Self  aspirin 81 MG EC tablet [id number]  Take 1 tablet (81 mg total) by mouth daily. Swallow whole. Jearld Fenton, NP  Active Self, Multiple Informants  bisoprolol (ZEBETA) 10 MG tablet [id number]  Take 1 tablet (10 mg total) by mouth daily. OFFICE VISIT NEEDED FOR ADDITIONAL REFILLS Jearld Fenton, NP  Active   buPROPion (WELLBUTRIN XL) 300 MG 24 hr tablet [id number]  Take 1 tablet (300 mg total) by mouth daily. Carrie Mew, MD  Active Multiple Informants, Self  busPIRone (BUSPAR) 15 MG tablet [id number]  Take 15 mg by mouth 2 (two) times daily. [provider]  Active Self  docusate sodium (COLACE) 100 MG capsule [id number]  Take 100 mg by mouth daily. [provider]  Active Self, Multiple Informants  Dulaglutide 3 MG/0.5ML SOPN [id number] Yes Inject 0.5 mLs (3 mg total) subcutaneously once a week [provider] Taking Active Self, Multiple Informants  famotidine (PEPCID) 20 MG tablet [id number]  Take 20 mg by mouth 2 (two) times daily. [provider]  Active Multiple Informants, Self  FARXIGA 10 MG TABS tablet [id number] Yes Take 10 mg by mouth daily. [provider] Taking Active Multiple Informants, Self  fluticasone (FLONASE) 50 MCG/ACT nasal spray [id number]  Place 1 spray into both nostrils daily. Jearld Fenton, NP  Active   fluticasone-salmeterol (ADVAIR) 250-50 MCG/ACT AEPB [id number]  Inhale 1  puff into the lungs 2 (two) times daily. [provider]  Active Multiple Informants, Self  gabapentin (NEURONTIN) 600 MG tablet [id number]  Take 600-900 mg by mouth 3 (three) times daily. 600 mg AM, 600 mg pm and 900 mg at bedtime [provider]  Active Multiple Informants, Self  GLOBAL EASE INJECT PEN NEEDLES 32G X 4 MM MISC [id number]  Use as directed with insulin Kendell Bane, NP  Active Multiple Informants, Self  insulin aspart (NOVOLOG) 100 UNIT/ML  FlexPen [id number]  Inject 20 Units into the skin 3 (three) times daily with meals. [provider]  Active Self, Multiple Informants  Insulin Glargine Solostar (LANTUS) 100 UNIT/ML Solostar Pen [id number]  Inject 66 Units subcutaneously at bedtime [provider]  Active Self, Multiple Informants  Insulin Pen Needle (BD PEN NEEDLE NANO U/F) 32G X 4 MM MISC [id number]  Inject 1 Device into the skin daily. Jearld Fenton, NP  Active Multiple Informants, Self  Insulin Syringe-Needle U-100 (INSULIN SYRINGE 1CC/31GX5/16") 31G X 5/16" 1 ML MISC [id number]  1 Syringe by Does not apply route in the morning, at noon, in the evening, and at bedtime. Jearld Fenton, NP  Active Multiple Informants, Self  LINZESS 290 MCG CAPS capsule [id number]  Take 1 capsule (290 mcg total) by mouth daily before breakfast. Jearld Fenton, NP  Active Self, Multiple Informants  lurasidone (LATUDA) 20 MG TABS tablet [id number]  Take 20 mg by mouth daily. [provider]  Active Self  metFORMIN (GLUCOPHAGE) 1000 MG tablet [id number] Yes Take 1,000 mg by mouth 2 (two) times daily with a meal. [provider] Taking Active Self, Multiple Informants  mirabegron ER (MYRBETRIQ) 50 MG TB24 tablet [id number]  Take 1 tablet (50 mg total) by mouth daily. Bjorn Loser, MD  Active Self, Multiple Informants  montelukast (SINGULAIR) 10 MG tablet [id number]  Take 1 tablet (10 mg total) by mouth daily. Carrie Mew, MD  Active Multiple Informants, Self  Olopatadine HCl 0.2 % SOLN [id number]  Place 1 drop into both eyes daily. Verl Bangs, FNP  Active Multiple Informants, Self  pantoprazole (PROTONIX) 40 MG tablet [id number]  Take 1 tablet (40 mg total) by mouth 2 (two) times daily. Jearld Fenton, NP  Active   pregabalin (LYRICA) 100 MG capsule [id number]  Take 100 mg by mouth 3 (three) times daily. [provider]  Active Self, Multiple Informants  rosuvastatin (CRESTOR) 10 MG tablet [id number]   Take 1 tablet (10 mg total) by mouth daily. Jearld Fenton, NP  Active   TRAVATAN Z 0.004 % SOLN ophthalmic solution [id number]  Place 1 drop into both eyes at bedtime. Carrie Mew, MD  Active Multiple Informants, Self            Pertinent Labs:  Lab Results  Component Value Date   HGBA1C 7.1 (A) 06/30/2022   Lab Results  Component Value Date   CHOL 68 05/15/2022   HDL 32 (L) 05/15/2022   LDLCALC 24 05/15/2022   TRIG 59 05/15/2022   CHOLHDL 2.1 05/15/2022   Lab Results  Component Value Date   CREATININE 1.04 (H) 05/14/2022   BUN 12 05/14/2022   NA 143 05/14/2022   K 4.3 05/14/2022   CL 107 05/14/2022   CO2 27 05/14/2022    SDOH:  (Social Determinants of Health) assessments and interventions performed:  SDOH Interventions    [place] Coordination from 07/24/2022 in [place] Coordination from 06/22/2022 in  [place] Coordination Chronic Care Management from 01/12/2022 in [place] from 12/18/2021 in [facility] Office Visit from 07/09/2021 in [place] from 12/31/2020 in [place]  SDOH Interventions        Food Insecurity Interventions -- Intervention Not Indicated Intervention Not Indicated Intervention Not Indicated -- --  Housing Interventions -- Intervention Not Indicated Intervention Not Indicated Intervention Not Indicated -- --  Transportation Interventions -- Intervention Not Indicated Intervention Not Indicated Intervention Not Indicated -- --  Utilities Interventions Intervention Not Indicated -- -- -- -- --  Depression Interventions/Treatment  -- -- -- Currently on Treatment, Counseling Currently on Treatment Counseling, Currently on Treatment  Financial Strain Interventions -- -- Intervention Not Indicated Intervention Not Indicated -- --  Physical Activity Interventions --  -- Other (Comments)  [has an exercise bike but has not been using it] -- -- --  Stress Interventions -- -- Intervention Not Indicated -- -- --  Social Connections Interventions -- -- Other (Comment)  [has good support system. 4 children] Intervention Not Indicated -- --       CCM Care Plan  Review of patient past medical history, allergies, medications, health status, including review of consultants reports, laboratory and other test data, was performed as part of comprehensive evaluation and provision of chronic care management services.   Care Plan : PharmD - Medication Management/T2DM  Updates made by Rennis Petty, RPH-CPP since 10/07/2022 12:00 AM     Problem: Disease Progression      Long-Range Goal: Disease Progression Prevented or Minimized   Start Date: 01/19/2022  Expected End Date: 04/19/2022  Priority: High  Note:   Current Barriers:  Unable to achieve control of blood sugar  Chronic Disease Management support and education needs related to managing HTN, GERD, major depressive disorder, Type II DM, history of CVA, diabetic neuropathy, GAD, OSA, HLD, constipation, obesity, COPD  Pharmacist Clinical Goal(s):  patient will achieve adherence to monitoring guidelines and medication adherence to achieve therapeutic efficacy achieve control of blood sugar as evidenced by A1C  through collaboration with PharmD and provider.    Interventions: 1:1 collaboration with Jearld Fenton, NP regarding development and update of comprehensive plan of care as evidenced by provider attestation and co-signature Inter-disciplinary care team collaboration (see longitudinal plan of care) Perform chart review. Patient seen for Office Visit with [facility] on 11/27. Provider advised patient: Will refer to neurosurgery for cervical radiculopathy without success on medication or injections. Continue Plavix 75 mg daily for secondary stroke prevention. Refilled Continue Aspirin  81 mg daily for stroke prevention Continue Gabapentin 600 mg twice daily with 900 mg at night. Refilled. Continue Lyrica 75 mg three times daily. Refilled From review of chart/per patient today, her injection from Dr. Alba Destine had to be rescheduled for 11/03/2022 as had not held her Plavix as directed prior to procedure Confirms now aware of instructions to hold Plavix for 7 days prior to this procedure  Medication Adherence: Patient uses weekly pillbox to organize her medications/as adherence aid Have encouraged patient to also use latest medication list as aid when filling weekly pillbox to ensure all current medications are included  T2DM: Patient followed by [facility] Uncontrolled; current treatment: Metformin 1000 mg twice daily Trulicity 3 mg weekly on Fridays Lantus 66 units nightly Novolog 20 units three times daily before meals Farxiga 10 mg daily Note during last telephone appointment with patient on  11/20, she reported a recent episode of hypoglycemia that resulted in her son calling EMS and that since that episode, she had been concerned about hypoglycemia and monitoring her blood sugar more closely and also sometimes been skipping doses of Novolog (if blood sugar close to 100 prior to a meal) or skipping Lantus dose overnight On 11/20 both myself and patient contacted Endocrinology office to collaborate regarding patient. From review of chart, note she was scheduled for appointment in [place] the next day, 11/21 Today patient reports that she attempted to go for Endocrinology appointment on 11/21, but was not aware that this was at the [facility] office and went to [facility] as well. Reports continues to monitor her blood sugar closely and keeps rapid-acting source of sugar with her, but denies having called by to Endocrinology to reschedule missed appointment Today advise patient to again contact Endocrinology office today to let provider know about episodes of  hypoglycemia and for guidance regarding her insulin adjustment Review chart later in afternoon and note patient now schedule to see Dr. Gabriel Carina on 12/11 Statin: rosuvastatin 10 mg daily Reports has not yet restarted using Freestyle Libre 2 Continuous Glucose Monitor (CGM) reader and sensors, but reports collaborating with Endocrinology office to receive new reader through [place] Patient monitoring with glucometer while waiting to restart using CGM Encourage patient to monitor home blood as directed by Endocrinologist and to follow up with clinic for readings outside of established parameters or new symptoms   Patient Goals/Self-Care Activities patient will:  - take medications as prescribed as evidenced by patient report and record review - check glucose, document, and provide at future appointments - check blood pressure, document, and provide at future appointments       Plan: Telephone follow up appointment with care management team member scheduled for:  11/09/2022 at 1 pm  Wallace Cullens, PharmD, [place] [phone]

## 2022-10-09 DIAGNOSIS — H402232 Chronic angle-closure glaucoma, bilateral, moderate stage: Secondary | ICD-10-CM | POA: Diagnosis not present

## 2022-10-09 NOTE — Patient Instructions (Signed)
Visit Information  Thank you for taking time to visit with me today. Please don't hesitate to contact me if I can be of assistance to you before our next scheduled telephone appointment.  Following are the goals we discussed today:   Goals Addressed             This Visit's Progress    Pharmacy Goals       Our goal A1c is less than 7%. This corresponds with fasting sugars less than 130 and 2 hour after meal sugars less than 180. Please check your blood sugar and keep a log of the results  Our goal bad cholesterol, or LDL, is less than 70 . This is why it is important to continue taking your rosuvastatin.  Feel free to call me with any questions or concerns. I look forward to our next call!    Wallace Cullens, PharmD, [place] [phone]         Our next appointment is by telephone on 11/09/2022 at 1:00 PM  Please call the care guide team at [phone] if you need to cancel or reschedule your appointment.   Patient verbalizes understanding of instructions and care plan provided today and agrees to view in [place]. Active MyChart status and patient understanding of how to access instructions and care plan via MyChart confirmed with patient.

## 2022-10-13 DIAGNOSIS — E669 Obesity, unspecified: Secondary | ICD-10-CM | POA: Diagnosis not present

## 2022-10-13 DIAGNOSIS — E11649 Type 2 diabetes mellitus with hypoglycemia without coma: Secondary | ICD-10-CM | POA: Diagnosis not present

## 2022-10-13 DIAGNOSIS — E1142 Type 2 diabetes mellitus with diabetic polyneuropathy: Secondary | ICD-10-CM | POA: Diagnosis not present

## 2022-10-13 DIAGNOSIS — Z794 Long term (current) use of insulin: Secondary | ICD-10-CM | POA: Diagnosis not present

## 2022-10-13 DIAGNOSIS — E1169 Type 2 diabetes mellitus with other specified complication: Secondary | ICD-10-CM | POA: Diagnosis not present

## 2022-10-13 DIAGNOSIS — I10 Essential (primary) hypertension: Secondary | ICD-10-CM | POA: Diagnosis not present

## 2022-10-16 ENCOUNTER — Telehealth: Payer: Medicare HMO

## 2022-10-16 ENCOUNTER — Ambulatory Visit: Payer: Self-pay

## 2022-10-16 ENCOUNTER — Telehealth: Payer: Self-pay

## 2022-10-16 DIAGNOSIS — E1165 Type 2 diabetes mellitus with hyperglycemia: Secondary | ICD-10-CM

## 2022-10-16 DIAGNOSIS — I10 Essential (primary) hypertension: Secondary | ICD-10-CM

## 2022-10-16 NOTE — Plan of Care (Signed)
Chronic Care Management Provider Comprehensive Care Plan    10/16/2022 Name: Rachel Nielsen MRN: [id number] DOB: 1951/01/31  Referral to Chronic Care Management (CCM) services was placed by Provider:  Webb Silversmith, NP on Date: 08-27-2022.  Chronic Condition 1: DM Provider Assessment and Plan  POCT A1c 7.1% Urine my gravity when checked 01/2022 Continue Lantus, NovoLog, Trulicity and Farxiga Her eye exam is scheduled for 1 month from now Encourage routine foot exams Encourage low-carb diet and exercise for weight loss Encouraged her to get a flu shot in the fall Pneumonia vaccine UTD       Expected Outcome/Goals Addressed This Visit (Provider CCM goals/Provider Assessment and plan   Goal: CCM (DIABETES)  EXPECTED OUTCOME:  MONITOR,SELF- MANAGE AND REDUCE SYMPTOMS OF DIABETES  Symptom Management Condition 1: Take all medications as prescribed Attend all scheduled provider appointments Call provider office for new concerns or questions  call the Suicide and Crisis Lifeline: 988 call the [place] National Suicide Prevention Lifeline: (225) 191-9219 or TTY: 917-047-1012 TTY 256-053-8993) to talk to a trained counselor call 1-800-273-TALK (toll free, 24 hour hotline) if experiencing a Mental Health or [place]  check feet daily for cuts, sores or redness trim toenails straight across manage portion size wash and dry feet carefully every day wear comfortable, cotton socks wear comfortable, well-fitting shoes  Chronic Condition 2: HTN Provider Assessment and Plan On amlodipine and bisoprolol Reinforced DASH diet and exercise weight loss We will monitor   Expected Outcome/Goals Addressed This Visit (Provider CCM goals/Provider Assessment and plan   Goal: CCM (HYPERTENSION)  EXPECTED OUTCOME:  MONITOR,SELF- MANAGE AND REDUCE SYMPTOMS OF HYPERTENSION   Symptom Management Condition 2: Take all medications as prescribed Attend all scheduled provider  appointments Call provider office for new concerns or questions  call the Suicide and Crisis Lifeline: 988 call the [place] National Suicide Prevention Lifeline: 847 574 0890 or TTY: (417)627-2580 [place] (917)862-6938) to talk to a trained counselor call 1-800-273-TALK (toll free, 24 hour hotline) if experiencing a Mental Health or [place]  check blood pressure weekly learn about high blood pressure take blood pressure log to all doctor appointments call doctor for signs and symptoms of high blood pressure keep all doctor appointments take medications for blood pressure exactly as prescribed report new symptoms to your doctor  Problem List Patient Active Problem List   Diagnosis Date Noted   CKD (chronic kidney disease) stage 3, GFR 30-59 ml/min ([place]) 06/30/2022   OAB (overactive bladder) 06/30/2022   History of CVA (cerebrovascular accident) 05/12/2022   Mixed diabetic hyperlipidemia associated with type 2 diabetes mellitus ([place]) 07/09/2021   Chronic constipation 07/09/2021   Cervical radiculopathy 07/09/2021   Lumbar radiculopathy 07/09/2021   Class 2 obesity due to excess calories with body mass index (BMI) of 39.0 to 39.9 in adult 07/09/2021   OSA (obstructive sleep apnea) 12/18/2020   Diabetic neuropathy ([place]) 12/17/2020   Centrilobular emphysema ([place]) 12/17/2020   GAD (generalized anxiety disorder) 12/17/2020   Uncontrolled type 2 diabetes mellitus with hyperglycemia ([place]) 03/14/2019   Hypertension 12/21/2017   GERD (gastroesophageal reflux disease) 12/21/2017   Major depressive disorder, recurrent, moderate ([place]) 12/21/2017    Medication Management  Current Outpatient Medications:    ACCU-CHEK GUIDE test strip, Use to check blood sugar up to twice a day as directed, Disp: 200 strip, Rfl: 2   Accu-Chek Softclix Lancets lancets, Use to check blood sugar up to twice a day as directed, Disp: 200 each, Rfl: 2   albuterol (PROVENTIL) (  2.5 MG/3ML) 0.083% nebulizer  solution, Inhale 3 mLs (2.5 mg total) into the lungs every 4 (four) hours as needed for wheezing or shortness of breath. (Patient taking differently: Inhale 2.5 mg into the lungs every 6 (six) hours as needed for wheezing or shortness of breath.), Disp: 75 mL, Rfl: 12   albuterol (VENTOLIN HFA) 108 (90 Base) MCG/ACT inhaler, Inhale 2-4 puffs by mouth every 4 hours as needed for wheezing, cough, and/or shortness of breath (Patient taking differently: 2 puffs every 6 (six) hours as needed.), Disp: 6.7 g, Rfl: 0   ALPRAZolam (XANAX) 1 MG tablet, Take 1 mg by mouth 3 (three) times daily as needed for anxiety or sleep., Disp: , Rfl:    amLODipine (NORVASC) 5 MG tablet, Take 1 tablet (5 mg total) by mouth daily., Disp: 90 tablet, Rfl: 0   Asenapine Maleate 10 MG SUBL, Place 5 mg under the tongue at bedtime., Disp: , Rfl:    aspirin 81 MG EC tablet, Take 1 tablet (81 mg total) by mouth daily. Swallow whole., Disp: 30 tablet, Rfl: 12   bisoprolol (ZEBETA) 10 MG tablet, Take 1 tablet (10 mg total) by mouth daily. OFFICE VISIT NEEDED FOR ADDITIONAL REFILLS, Disp: 90 tablet, Rfl: 0   buPROPion (WELLBUTRIN XL) 300 MG 24 hr tablet, Take 1 tablet (300 mg total) by mouth daily., Disp: 30 tablet, Rfl: 0   busPIRone (BUSPAR) 15 MG tablet, Take 15 mg by mouth 2 (two) times daily., Disp: , Rfl:    docusate sodium (COLACE) 100 MG capsule, Take 100 mg by mouth daily., Disp: , Rfl:    Dulaglutide 3 MG/0.5ML SOPN, Inject 0.5 mLs (3 mg total) subcutaneously once a week, Disp: , Rfl:    famotidine (PEPCID) 20 MG tablet, Take 20 mg by mouth 2 (two) times daily., Disp: , Rfl:    FARXIGA 10 MG TABS tablet, Take 10 mg by mouth daily., Disp: , Rfl:    fluticasone (FLONASE) 50 MCG/ACT nasal spray, Place 1 spray into both nostrils daily., Disp: 16 g, Rfl: 2   fluticasone-salmeterol (ADVAIR) 250-50 MCG/ACT AEPB, Inhale 1 puff into the lungs 2 (two) times daily., Disp: , Rfl:    gabapentin (NEURONTIN) 600 MG tablet, Take 600-900 mg by  mouth 3 (three) times daily. 600 mg AM, 600 mg pm and 900 mg at bedtime, Disp: , Rfl:    GLOBAL EASE INJECT PEN NEEDLES 32G X 4 MM MISC, Use as directed with insulin, Disp: 100 each, Rfl: 0   insulin aspart (NOVOLOG) 100 UNIT/ML FlexPen, Inject 20 Units into the skin 3 (three) times daily with meals., Disp: , Rfl:    Insulin Glargine Solostar (LANTUS) 100 UNIT/ML Solostar Pen, Inject 66 Units subcutaneously at bedtime, Disp: , Rfl:    Insulin Pen Needle (BD PEN NEEDLE NANO U/F) 32G X 4 MM MISC, Inject 1 Device into the skin daily., Disp: 100 each, Rfl: 3   Insulin Syringe-Needle U-100 (INSULIN SYRINGE 1CC/31GX5/16") 31G X 5/16" 1 ML MISC, 1 Syringe by Does not apply route in the morning, at noon, in the evening, and at bedtime., Disp: 200 each, Rfl: 3   LINZESS 290 MCG CAPS capsule, Take 1 capsule (290 mcg total) by mouth daily before breakfast., Disp: 90 capsule, Rfl: 3   lurasidone (LATUDA) 20 MG TABS tablet, Take 20 mg by mouth daily., Disp: , Rfl:    metFORMIN (GLUCOPHAGE) 1000 MG tablet, Take 1,000 mg by mouth 2 (two) times daily with a meal., Disp: , Rfl:  mirabegron ER (MYRBETRIQ) 50 MG TB24 tablet, Take 1 tablet (50 mg total) by mouth daily., Disp: 30 tablet, Rfl: 11   montelukast (SINGULAIR) 10 MG tablet, Take 1 tablet (10 mg total) by mouth daily., Disp: 30 tablet, Rfl: 0   Olopatadine HCl 0.2 % SOLN, Place 1 drop into both eyes daily., Disp: 2.5 mL, Rfl: 0   pantoprazole (PROTONIX) 40 MG tablet, Take 1 tablet (40 mg total) by mouth 2 (two) times daily., Disp: 180 tablet, Rfl: 1   pregabalin (LYRICA) 100 MG capsule, Take 100 mg by mouth 3 (three) times daily., Disp: , Rfl:    rosuvastatin (CRESTOR) 10 MG tablet, Take 1 tablet (10 mg total) by mouth daily., Disp: 90 tablet, Rfl: 0   TRAVATAN Z 0.004 % SOLN ophthalmic solution, Place 1 drop into both eyes at bedtime., Disp: 5 mL, Rfl: 0  Cognitive Assessment Identity Confirmed: : Name; DOB Cognitive Status: Normal   Functional  Assessment Hearing Difficulty or Deaf: yes Hearing Management: decreased hearing in her left ear, wants to see a provider in the new year to have hearing checked Wear Glasses or Blind: yes Vision Management: has glaucoma, wears glasses, also has drops that she puts in her eyes Concentrating, Remembering or Making Decisions Difficulty (CP): yes Concentration Management: sometimes she is forgetful and sometimes she does not follow recommendations from the provider Difficulty Communicating: no Difficulty Eating/Swallowing: no Walking or Climbing Stairs Difficulty: yes Walking or Climbing Stairs: ambulation difficulty, requires equipment Mobility Management: had been having frequent falls with using her cane so her neurologist ask her to start using her walker all the time. Dressing/Bathing Difficulty: no Doing Errands Independently Difficulty (such as shopping) (CP): yes Errands Management: son assist her Change in Functional Status Since Onset of Current Illness/Injury: no   Caregiver Assessment  Primary Source of Support/Comfort: child(ren) Name of Support/Comfort Primary Source: Sacha Topor- son People in Home: child(ren), adult Name(s) of People in Home: [place] if Needed: child(ren), adult Family Caregiver Names: Jylian Pappalardo the patients son Primary Roles/Responsibilities: retired   Planned Interventions  Provided education to patient about basic DM disease process; Reviewed medications with patient and discussed importance of medication adherence;        Reviewed prescribed diet with patient heart healthy/ADA diet; Counseled on importance of regular laboratory monitoring as prescribed;        Discussed plans with patient for ongoing care management follow up and provided patient with direct contact information for care management team;      Provided patient with written educational materials related to hypo and hyperglycemia and importance of correct  treatment. Education given and discussed keeping glucose tablets and snacks around in the event of low blood sugars;       Reviewed scheduled/upcoming provider appointments including: 12-31-2022 at 11 am;         Advised patient, providing education and rationale, to check cbg three times daily and when you have symptoms of low or high blood sugar and record. Is checking more frequently since she had the hypoglycemic event at the end of November. Has seen the endocrinologist and has been given parameters to call and when to take insulin      call provider for findings outside established parameters. The patient spoke to the endocrinologist office today and they told her to call the office if her blood sugars for 3 readings were over 150.;       Referral made to pharmacy team for assistance with ongoing support  and education for medication and management of medications effectively;       Review of patient status, including review of consultants reports, relevant laboratory and other test results, and medications completed;       Advised patient to discuss changes in DM, questions or concerns with provider;      Screening for signs and symptoms of depression related to chronic disease state;        Assessed social determinant of health barriers;    Saw the eye doctor recently and her pressure was up and her tear ducts were blocked. She has been back a couple of tim Evaluation of current treatment plan related to hypertension self management and patient's adherence to plan as established by provider;   Provided education to patient re: stroke prevention, s/s of heart attack and stroke; Reviewed prescribed diet heart healthy/ADA diet Reviewed medications with patient and discussed importance of compliance;  Discussed plans with patient for ongoing care management follow up and provided patient with direct contact information for care management team; Advised patient, providing education and rationale, to  monitor blood pressure daily and record, calling PCP for findings outside established parameters;  Reviewed scheduled/upcoming provider appointments including: 12-31-2022 at 11 am with the pcp Advised patient to discuss changes in her HTN or heart health with provider; Provided education on prescribed diet heart healthy/ADA;  Discussed complications of poorly controlled blood pressure such as heart disease, stroke, circulatory complications, vision complications, kidney impairment, sexual dysfunction;  Screening for signs and symptoms of depression related to chronic disease state;  Assessed social determinant of health barriers;  The patient has noticed changes in her hearing and feels she needs to have a hearing test in the new year. Will ask for a referral from pcp for audiologist referral   Interaction and coordination with outside resources, practitioners, and providers See CCM Referral  Care Plan: Available in MyChart

## 2022-10-16 NOTE — Chronic Care Management (AMB) (Signed)
Chronic Care Management   CCM RN Visit Note  10/16/2022 Name: Rachel Nielsen MRN: [id number] DOB: Feb 08, 1951  Subjective: Rachel Nielsen is a 71 y.o. year old female who is a primary care patient of Jearld Fenton, NP. The patient was referred to the Chronic Care Management team for assistance with care management needs subsequent to provider initiation of CCM services and plan of care.    Today's Visit:  Engaged with patient by telephone for initial visit.        Goals Addressed             This Visit's Progress    CCM Expected Outcome:  Monitor, Self-Manage and Reduce Symptoms of Diabetes       Current Barriers:  Knowledge Deficits related to hypoglycemia and how to effectively manage low blood sugars Chronic Disease Management support and education needs related to effective management of DM  Planned Interventions: Provided education to patient about basic DM disease process; Reviewed medications with patient and discussed importance of medication adherence;        Reviewed prescribed diet with patient heart healthy/ADA diet; Counseled on importance of regular laboratory monitoring as prescribed;        Discussed plans with patient for ongoing care management follow up and provided patient with direct contact information for care management team;      Provided patient with written educational materials related to hypo and hyperglycemia and importance of correct treatment. Education given and discussed keeping glucose tablets and snacks around in the event of low blood sugars;       Reviewed scheduled/upcoming provider appointments including: 12-31-2022 at 11 am;         Advised patient, providing education and rationale, to check cbg three times daily and when you have symptoms of low or high blood sugar and record. Is checking more frequently since she had the hypoglycemic event at the end of November. Has seen the endocrinologist and has been given parameters to call and when to  take insulin      call provider for findings outside established parameters. The patient spoke to the endocrinologist office today and they told her to call the office if her blood sugars for 3 readings were over 150.;       Referral made to pharmacy team for assistance with ongoing support and education for medication and management of medications effectively;       Review of patient status, including review of consultants reports, relevant laboratory and other test results, and medications completed;       Advised patient to discuss changes in DM, questions or concerns with provider;      Screening for signs and symptoms of depression related to chronic disease state;        Assessed social determinant of health barriers;    Saw the eye doctor recently and her pressure was up and her tear ducts were blocked. She has been back a couple of times for evaluation and treatment.       Symptom Management: Take medications as prescribed   Attend all scheduled provider appointments Call provider office for new concerns or questions  call the Suicide and Crisis Lifeline: 988 call the [place] National Suicide Prevention Lifeline: 319-530-6871 or TTY: 440-343-1948 TTY 662-618-9175) to talk to a trained counselor call 1-800-273-TALK (toll free, 24 hour hotline) if experiencing a Mental Health or [place]  check feet daily for cuts, sores or redness trim toenails straight across manage portion size  wash and dry feet carefully every day wear comfortable, cotton socks wear comfortable, well-fitting shoes  Follow Up Plan: Telephone follow up appointment with care management team member scheduled for: 12-11-2022 at 1 pm       CCM Expected Outcome:  Monitor, Self-Manage, and Reduce Symptoms of Hypertension       Current Barriers:  Knowledge Deficits related to normal blood pressure readings and the need to maintain control of blood pressure to prevent any adverse effects or increase risk of  heart attack and stroke Chronic Disease Management support and education needs related to effective management of HTN  Planned Interventions: Evaluation of current treatment plan related to hypertension self management and patient's adherence to plan as established by provider;   Provided education to patient re: stroke prevention, s/s of heart attack and stroke; Reviewed prescribed diet heart healthy/ADA diet Reviewed medications with patient and discussed importance of compliance;  Discussed plans with patient for ongoing care management follow up and provided patient with direct contact information for care management team; Advised patient, providing education and rationale, to monitor blood pressure daily and record, calling PCP for findings outside established parameters;  Reviewed scheduled/upcoming provider appointments including: 12-31-2022 at 11 am with the pcp Advised patient to discuss changes in her HTN or heart health with provider; Provided education on prescribed diet heart healthy/ADA;  Discussed complications of poorly controlled blood pressure such as heart disease, stroke, circulatory complications, vision complications, kidney impairment, sexual dysfunction;  Screening for signs and symptoms of depression related to chronic disease state;  Assessed social determinant of health barriers;  The patient has noticed changes in her hearing and feels she needs to have a hearing test in the new year. Will ask for a referral from pcp for audiologist referral  Symptom Management: Take medications as prescribed   Attend all scheduled provider appointments Call provider office for new concerns or questions  call the Suicide and Crisis Lifeline: 988 call the [place] National Suicide Prevention Lifeline: (203) 361-7344 or TTY: 934-516-0953 TTY 409-214-0613) to talk to a trained counselor call 1-800-273-TALK (toll free, 24 hour hotline) if experiencing a Mental Health or [place]  check blood pressure weekly learn about high blood pressure take blood pressure log to all doctor appointments call doctor for signs and symptoms of high blood pressure develop an action plan for high blood pressure keep all doctor appointments take medications for blood pressure exactly as prescribed report new symptoms to your doctor  Follow Up Plan: Telephone follow up appointment with care management team member scheduled for:12-11-2022 at 1 pm       COMPLETED: RNCM: Effective Management of post CVA       Care Coordination Interventions: Evaluation of current treatment plan related to recent CVA with memory changes and patient's adherence to plan as established by provider. The patient saw the neurologist recently and the pcp. More PT and OT are going to be ordered.  Advised patient to call the office for changes in conditions, questions, or worsening sx and sx of memory problems- is seeing neurologist also Provided education to patient re: pacing activity, staying hydrated, eating balanced meals, taking medications as directed, working with PT, calling the office for assistance with new concerns. Review with the patient. Reviewed medications with patient and discussed compliance. Working with the pharm D. Collaborated with pharm D  regarding the patient stating she forgot what she was supposed to tell pcp on her upcoming visit that she is having some memory concerns. Education  and support given. Have spoke to the pharm D and message delivered.  Provided patient with fall and safety educational materials related to high risk for falls and safety concerns Reviewed scheduled/upcoming provider appointments including 2-29-2023 at 11 am Pharmacy referral for assistance with medications. The patient is already working with the patient for assistance with medications and education needs.  Discussed plans with patient for ongoing care management follow up and provided patient with direct  contact information for care management team Advised patient to discuss changes in her health and well being, questions, or concerns she may have with provider Screening for signs and symptoms of depression related to chronic disease state  Assessed social determinant of health barriers Review of falls prevention and safety. The patient had a fall recently but she states she did not get injured because she landed on her butt. Review of safety concerns and fall prevention.           Plan:Telephone follow up appointment with care management team member scheduled for:  12-11-2022 at 1 pm  [place], MSN, CCM RN Care Manager  Chronic Care Management Direct Number: [phone]

## 2022-10-16 NOTE — Patient Instructions (Signed)
Please call the care guide team at [phone] if you need to cancel or reschedule your appointment.   If you are experiencing a Mental Health or [place] or need someone to talk to, please call the Suicide and Crisis Lifeline: 988 call the [place] National Suicide Prevention Lifeline: 623-227-8430 or TTY: 2155050816 TTY (936)080-2638) to talk to a trained counselor call 1-800-273-TALK (toll free, 24 hour hotline)   Following is a copy of your full provider care plan:   Goals Addressed             This Visit's Progress    CCM Expected Outcome:  Monitor, Self-Manage and Reduce Symptoms of Diabetes       Current Barriers:  Knowledge Deficits related to hypoglycemia and how to effectively manage low blood sugars Chronic Disease Management support and education needs related to effective management of DM  Planned Interventions: Provided education to patient about basic DM disease process; Reviewed medications with patient and discussed importance of medication adherence;        Reviewed prescribed diet with patient heart healthy/ADA diet; Counseled on importance of regular laboratory monitoring as prescribed;        Discussed plans with patient for ongoing care management follow up and provided patient with direct contact information for care management team;      Provided patient with written educational materials related to hypo and hyperglycemia and importance of correct treatment. Education given and discussed keeping glucose tablets and snacks around in the event of low blood sugars;       Reviewed scheduled/upcoming provider appointments including: 12-31-2022 at 11 am;         Advised patient, providing education and rationale, to check cbg three times daily and when you have symptoms of low or high blood sugar and record. Is checking more frequently since she had the hypoglycemic event at the end of November. Has seen the endocrinologist and has been given parameters to  call and when to take insulin      call provider for findings outside established parameters. The patient spoke to the endocrinologist office today and they told her to call the office if her blood sugars for 3 readings were over 150.;       Referral made to pharmacy team for assistance with ongoing support and education for medication and management of medications effectively;       Review of patient status, including review of consultants reports, relevant laboratory and other test results, and medications completed;       Advised patient to discuss changes in DM, questions or concerns with provider;      Screening for signs and symptoms of depression related to chronic disease state;        Assessed social determinant of health barriers;    Saw the eye doctor recently and her pressure was up and her tear ducts were blocked. She has been back a couple of times for evaluation and treatment.       Symptom Management: Take medications as prescribed   Attend all scheduled provider appointments Call provider office for new concerns or questions  call the Suicide and Crisis Lifeline: 988 call the [place] National Suicide Prevention Lifeline: 671 543 5925 or TTY: 947-039-9231 TTY 9157325856) to talk to a trained counselor call 1-800-273-TALK (toll free, 24 hour hotline) if experiencing a Mental Health or [place]  check feet daily for cuts, sores or redness trim toenails straight across manage portion size wash and dry feet carefully every  day wear comfortable, cotton socks wear comfortable, well-fitting shoes  Follow Up Plan: Telephone follow up appointment with care management team member scheduled for: 12-11-2022 at 1 pm       CCM Expected Outcome:  Monitor, Self-Manage, and Reduce Symptoms of Hypertension       Current Barriers:  Knowledge Deficits related to normal blood pressure readings and the need to maintain control of blood pressure to prevent any adverse effects or  increase risk of heart attack and stroke Chronic Disease Management support and education needs related to effective management of HTN  Planned Interventions: Evaluation of current treatment plan related to hypertension self management and patient's adherence to plan as established by provider;   Provided education to patient re: stroke prevention, s/s of heart attack and stroke; Reviewed prescribed diet heart healthy/ADA diet Reviewed medications with patient and discussed importance of compliance;  Discussed plans with patient for ongoing care management follow up and provided patient with direct contact information for care management team; Advised patient, providing education and rationale, to monitor blood pressure daily and record, calling PCP for findings outside established parameters;  Reviewed scheduled/upcoming provider appointments including: 12-31-2022 at 11 am with the pcp Advised patient to discuss changes in her HTN or heart health with provider; Provided education on prescribed diet heart healthy/ADA;  Discussed complications of poorly controlled blood pressure such as heart disease, stroke, circulatory complications, vision complications, kidney impairment, sexual dysfunction;  Screening for signs and symptoms of depression related to chronic disease state;  Assessed social determinant of health barriers;  The patient has noticed changes in her hearing and feels she needs to have a hearing test in the new year. Will ask for a referral from pcp for audiologist referral  Symptom Management: Take medications as prescribed   Attend all scheduled provider appointments Call provider office for new concerns or questions  call the Suicide and Crisis Lifeline: 988 call the [place] National Suicide Prevention Lifeline: (213) 557-8796 or TTY: 947-854-0471 TTY 760-572-5716) to talk to a trained counselor call 1-800-273-TALK (toll free, 24 hour hotline) if experiencing a Mental Health or  [place]  check blood pressure weekly learn about high blood pressure take blood pressure log to all doctor appointments call doctor for signs and symptoms of high blood pressure develop an action plan for high blood pressure keep all doctor appointments take medications for blood pressure exactly as prescribed report new symptoms to your doctor  Follow Up Plan: Telephone follow up appointment with care management team member scheduled for:12-11-2022 at 1 pm       COMPLETED: RNCM: Effective Management of post CVA       Care Coordination Interventions: Evaluation of current treatment plan related to recent CVA with memory changes and patient's adherence to plan as established by provider. The patient saw the neurologist recently and the pcp. More PT and OT are going to be ordered.  Advised patient to call the office for changes in conditions, questions, or worsening sx and sx of memory problems- is seeing neurologist also Provided education to patient re: pacing activity, staying hydrated, eating balanced meals, taking medications as directed, working with PT, calling the office for assistance with new concerns. Review with the patient. Reviewed medications with patient and discussed compliance. Working with the pharm D. Collaborated with pharm D  regarding the patient stating she forgot what she was supposed to tell pcp on her upcoming visit that she is having some memory concerns. Education and support given. Have spoke to  the pharm D and message delivered.  Provided patient with fall and safety educational materials related to high risk for falls and safety concerns Reviewed scheduled/upcoming provider appointments including 2-29-2023 at 11 am Pharmacy referral for assistance with medications. The patient is already working with the patient for assistance with medications and education needs.  Discussed plans with patient for ongoing care management follow up and provided  patient with direct contact information for care management team Advised patient to discuss changes in her health and well being, questions, or concerns she may have with provider Screening for signs and symptoms of depression related to chronic disease state  Assessed social determinant of health barriers Review of falls prevention and safety. The patient had a fall recently but she states she did not get injured because she landed on her butt. Review of safety concerns and fall prevention.           Patient verbalizes understanding of instructions and care plan provided today and agrees to view in [place]. Active MyChart status and patient understanding of how to access instructions and care plan via MyChart confirmed with patient.     Telephone follow up appointment with care management team member scheduled for: 12-11-2022 at 1 pm

## 2022-10-16 NOTE — Telephone Encounter (Signed)
   CCM RN Visit Note   10-16-2022 Name: Rachel Nielsen MRN: [id number]      DOB: December 16, 1950  Subjective: Rachel Nielsen is a 71 y.o. year old female who is a primary care patient of Jearld Fenton, NP. The patient was referred to the Chronic Care Management team for assistance with care management needs subsequent to provider initiation of CCM services and plan of care.      Today's Visit: Engaged with patient by telephone for  call made back to give the patient updated information from pcp .     A brief call was made back to the patient to let her know the pcp would like for her to be evaluated in the office for a hearting screen before sending a referral for audiology. The patient given information and verbalized understanding. Will continue to monitor for changes.    Plan:Telephone follow up appointment with care management team member scheduled for:  12-11-2022 at 1 pm  [place], MSN, CCM RN Care Manager  Chronic Care Management Direct Number: [phone]

## 2022-10-23 LAB — MICROALBUMIN, URINE: Microalb, Ur: 114.4

## 2022-10-23 LAB — PROTEIN / CREATININE RATIO, URINE: Creatinine, Urine: <7

## 2022-10-23 LAB — MICROALBUMIN / CREATININE URINE RATIO: Microalb Creat Ratio: <6.1

## 2022-10-30 ENCOUNTER — Other Ambulatory Visit: Payer: Self-pay | Admitting: Internal Medicine

## 2022-10-30 DIAGNOSIS — H402232 Chronic angle-closure glaucoma, bilateral, moderate stage: Secondary | ICD-10-CM | POA: Diagnosis not present

## 2022-10-30 DIAGNOSIS — I10 Essential (primary) hypertension: Secondary | ICD-10-CM

## 2022-10-31 ENCOUNTER — Other Ambulatory Visit: Payer: Self-pay | Admitting: Internal Medicine

## 2022-10-31 DIAGNOSIS — I10 Essential (primary) hypertension: Secondary | ICD-10-CM

## 2022-10-31 NOTE — Telephone Encounter (Signed)
Requested Prescriptions  Pending Prescriptions Disp Refills   bisoprolol (ZEBETA) 10 MG tablet [Pharmacy Med Name: BISOPROLOL FUMARATE 10 MG TAB] 90 tablet 0    Sig: Take 1 tablet (10 mg total) by mouth daily.     Cardiovascular: Beta Blockers 2 Failed - 10/30/2022 12:01 PM      Failed - Cr in normal range and within 360 days    Creat  Date Value Ref Range Status  02/10/2022 0.94 0.50 - 1.05 mg/dL Final   Creatinine, Ser  Date Value Ref Range Status  05/14/2022 1.04 (H) 0.44 - 1.00 mg/dL Final   Creatinine, Urine  Date Value Ref Range Status  02/10/2022 43 20 - 275 mg/dL Final         Passed - Last BP in normal range    BP Readings from Last 1 Encounters:  06/30/22 133/73         Passed - Last Heart Rate in normal range    Pulse Readings from Last 1 Encounters:  06/30/22 92         Passed - Valid encounter within last 6 months    Recent Outpatient Visits           4 months ago Uncontrolled type 2 diabetes mellitus with hyperglycemia ([place])   [facility], Coralie Keens, NP   8 months ago Encounter for general adult medical examination with abnormal findings   [facility] [place], Coralie Keens, NP   9 months ago COPD exacerbation [facility])   [facility] [place], Coralie Keens, NP   1 year ago Mixed diabetic hyperlipidemia associated with type 2 diabetes mellitus [facility])   [facility] [place], Coralie Keens, NP   1 year ago Uncontrolled type 2 diabetes mellitus with hyperglycemia [facility])   [facility] Olin Hauser, DO       Future Appointments             In 2 months Baity, Coralie Keens, NP [place]   In 2 months Bjorn Loser, [place]

## 2022-11-01 DIAGNOSIS — E1159 Type 2 diabetes mellitus with other circulatory complications: Secondary | ICD-10-CM

## 2022-11-01 DIAGNOSIS — I10 Essential (primary) hypertension: Secondary | ICD-10-CM

## 2022-11-01 NOTE — Telephone Encounter (Signed)
Rx was sent to pharmacy 10/31/22 #90/0  Requested Prescriptions  Refused Prescriptions Disp Refills   bisoprolol (ZEBETA) 10 MG tablet [Pharmacy Med Name: BISOPROLOL FUMARATE 10 MG TAB] 90 tablet 0    Sig: Take 1 tablet (10 mg total) by mouth daily. OFFICE VISIT NEEDED FOR ADDITIONAL REFILLS     Cardiovascular: Beta Blockers 2 Failed - 10/31/2022 10:26 AM      Failed - Cr in normal range and within 360 days    Creat  Date Value Ref Range Status  02/10/2022 0.94 0.50 - 1.05 mg/dL Final   Creatinine, Ser  Date Value Ref Range Status  05/14/2022 1.04 (H) 0.44 - 1.00 mg/dL Final   Creatinine, Urine  Date Value Ref Range Status  02/10/2022 43 20 - 275 mg/dL Final         Passed - Last BP in normal range    BP Readings from Last 1 Encounters:  06/30/22 133/73         Passed - Last Heart Rate in normal range    Pulse Readings from Last 1 Encounters:  06/30/22 92         Passed - Valid encounter within last 6 months    Recent Outpatient Visits           4 months ago Uncontrolled type 2 diabetes mellitus with hyperglycemia ([place])   [facility], Coralie Keens, NP   8 months ago Encounter for general adult medical examination with abnormal findings   [facility] [place], Coralie Keens, NP   9 months ago COPD exacerbation [facility])   [facility] [place], Coralie Keens, NP   1 year ago Mixed diabetic hyperlipidemia associated with type 2 diabetes mellitus [facility])   [facility] [place], Coralie Keens, NP   1 year ago Uncontrolled type 2 diabetes mellitus with hyperglycemia [facility])   [facility] Olin Hauser, DO       Future Appointments             In 2 months Baity, Coralie Keens, NP [facility], [place]   In 2 months Bjorn Loser, [place]

## 2022-11-03 DIAGNOSIS — E1165 Type 2 diabetes mellitus with hyperglycemia: Secondary | ICD-10-CM | POA: Diagnosis not present

## 2022-11-03 DIAGNOSIS — M5412 Radiculopathy, cervical region: Secondary | ICD-10-CM | POA: Diagnosis not present

## 2022-11-04 NOTE — Progress Notes (Unsigned)
Referring Physician:  Anabel Bene, MD [facility] [place],  [place] [address]  Primary Physician:  Rachel Fenton, NP  History of Present Illness: 11/05/2022 [name] has a history of OSA, GERD, DM with neuropathy, hyperlipidemia, CKD 3, depression, GAD, emphysema, and history of CVA.   Referred her from neurology Rachel Nielsen) for cervical radiculopathy. She also has seen PMR at [facility] Rachel Nielsen).   History of chronic neck pain that has been worse over last year. She has constant neck pain that radiates to right shoulder blade. She has occasional right arm pain into her entire hand. She notes weakness in right arm since her stroke in July. She has pain in left hand (middle/ring/small finger), but no arm pain.   Pain is worse with turning her head and when it rains.   She has some improvement of some of the right sided neck pain since having her injection 2 days ago, but she still has constant posterior and left/right sided neck pain with radiation into right shoulder blade.   She has some intermittent right lateral thigh pain that started after CVA in July- she had right sided symptoms with this that have improved 75%. She had previous stroke with left sided symptoms that have improved.     She is on PLAVIX.   She vapes with nicotine.   Conservative measures:  Physical therapy: did HHPT with [facility] after stroke (worked on neck as well)  Multimodal medical therapy including regular antiinflammatories: neurontin, lyrica, mobic  Injections:  -s/p right C3-4 TFESI 09/09/2021 with 75% relief -s/p right C3-4 TFESI 03/10/2022 with 50% relief  -s/p right C3-4 TFESI 11/03/22  Past Surgery: no spinal surgery  Rachel Nielsen has some right sided weakness and balance issues since her stroke in July. This is all improving.   The symptoms are causing a significant impact on the patient's life.   Review of Systems:  A 10 point review of systems is  negative, except for the pertinent positives and negatives detailed in the HPI.  Past Medical History: Past Medical History:  Diagnosis Date   Allergy    Anxiety    Asthma    Colon polyps    Constipation    COPD (chronic obstructive pulmonary disease) (HCC)    Depression    GERD (gastroesophageal reflux disease)    Glaucoma    [place] ([place])    Hypertension    Sleep apnea    Sleep apnea    Stroke Rachel Nielsen)    Urinary incontinence     Past Surgical History: Past Surgical History:  Procedure Laterality Date   APPENDECTOMY     CHOLECYSTECTOMY     COLONOSCOPY WITH PROPOFOL N/A 07/20/2018   Procedure: COLONOSCOPY WITH PROPOFOL;  Surgeon: Rachel Nielsen, Rachel Pike, MD;  Location: [facility] ENDOSCOPY;  Service: Gastroenterology;  Laterality: N/A;   ESOPHAGOGASTRODUODENOSCOPY N/A 07/20/2018   Procedure: ESOPHAGOGASTRODUODENOSCOPY (EGD);  Surgeon: Rachel Nielsen, Rachel Pike, MD;  Location: [facility] ENDOSCOPY;  Service: Gastroenterology;  Laterality: N/A;   GASTRECTOMY     TUBAL LIGATION     VAGINAL HYSTERECTOMY      Allergies: Allergies as of 11/05/2022 - Review Complete 11/05/2022  Allergen Reaction Noted   Ace inhibitors Cough 12/21/2017    Medications: Outpatient Encounter Medications as of 11/05/2022  Medication Sig   ACCU-CHEK GUIDE test strip Use to check blood sugar up to twice a day as directed   Accu-Chek Softclix Lancets lancets Use to check blood sugar up to twice  a day as directed   albuterol (PROVENTIL) (2.5 MG/3ML) 0.083% nebulizer solution Inhale 3 mLs (2.5 mg total) into the lungs every 4 (four) hours as needed for wheezing or shortness of breath. (Patient taking differently: Inhale 2.5 mg into the lungs every 6 (six) hours as needed for wheezing or shortness of breath.)   albuterol (VENTOLIN HFA) 108 (90 Base) MCG/ACT inhaler Inhale 2-4 puffs by mouth every 4 hours as needed for wheezing, cough, and/or shortness of breath (Patient taking differently: 2 puffs every 6 (six) hours as needed.)    ALPRAZolam (XANAX) 1 MG tablet Take 1 mg by mouth 3 (three) times daily as needed for anxiety or sleep.   amLODipine (NORVASC) 5 MG tablet Take 1 tablet (5 mg total) by mouth daily.   Asenapine Maleate 10 MG SUBL Place 5 mg under the tongue at bedtime.   aspirin 81 MG EC tablet Take 1 tablet (81 mg total) by mouth daily. Swallow whole.   bisoprolol (ZEBETA) 10 MG tablet Take 1 tablet (10 mg total) by mouth daily.   buPROPion (WELLBUTRIN XL) 300 MG 24 hr tablet Take 1 tablet (300 mg total) by mouth daily.   busPIRone (BUSPAR) 15 MG tablet Take 15 mg by mouth 2 (two) times daily.   docusate sodium (COLACE) 100 MG capsule Take 100 mg by mouth daily.   Dulaglutide 3 MG/0.5ML SOPN Inject 0.5 mLs (3 mg total) subcutaneously once a week   famotidine (PEPCID) 20 MG tablet Take 20 mg by mouth 2 (two) times daily.   FARXIGA 10 MG TABS tablet Take 10 mg by mouth daily.   fluticasone (FLONASE) 50 MCG/ACT nasal spray Place 1 spray into both nostrils daily.   fluticasone-salmeterol (ADVAIR) 250-50 MCG/ACT AEPB Inhale 1 puff into the lungs 2 (two) times daily.   gabapentin (NEURONTIN) 600 MG tablet Take 600-900 mg by mouth 3 (three) times daily. 600 mg AM, 600 mg pm and 900 mg at bedtime   GLOBAL EASE INJECT PEN NEEDLES 32G X 4 MM MISC Use as directed with insulin   insulin aspart (NOVOLOG) 100 UNIT/ML FlexPen Inject 20 Units into the skin 3 (three) times daily with meals.   Insulin Glargine Solostar (LANTUS) 100 UNIT/ML Solostar Pen Inject 66 Units subcutaneously at bedtime   Insulin Pen Needle (BD PEN NEEDLE NANO U/F) 32G X 4 MM MISC Inject 1 Device into the skin daily.   Insulin Syringe-Needle U-100 (INSULIN SYRINGE 1CC/31GX5/16") 31G X 5/16" 1 ML MISC 1 Syringe by Does not apply route in the morning, at noon, in the evening, and at bedtime.   LINZESS 290 MCG CAPS capsule Take 1 capsule (290 mcg total) by mouth daily before breakfast.   lurasidone (LATUDA) 20 MG TABS tablet Take 20 mg by mouth daily.    metFORMIN (GLUCOPHAGE) 1000 MG tablet Take 1,000 mg by mouth 2 (two) times daily with a meal.   mirabegron ER (MYRBETRIQ) 50 MG TB24 tablet Take 1 tablet (50 mg total) by mouth daily.   montelukast (SINGULAIR) 10 MG tablet Take 1 tablet (10 mg total) by mouth daily.   Olopatadine HCl 0.2 % SOLN Place 1 drop into both eyes daily.   pantoprazole (PROTONIX) 40 MG tablet Take 1 tablet (40 mg total) by mouth 2 (two) times daily.   pregabalin (LYRICA) 100 MG capsule Take 100 mg by mouth 3 (three) times daily.   rosuvastatin (CRESTOR) 10 MG tablet Take 1 tablet (10 mg total) by mouth daily.   TRAVATAN Z 0.004 % SOLN ophthalmic  solution Place 1 drop into both eyes at bedtime.   No facility-administered encounter medications on file as of 11/05/2022.    Social History: Social History   Tobacco Use   Smoking status: Former   Smokeless tobacco: Never   Tobacco comments:    Uses vape w/ nicotine  Vaping Use   Vaping Use: Every day   Substances: Nicotine, Flavoring  Substance Use Topics   Alcohol use: Not Currently    Comment: monthly or less    Drug use: No    Family Medical History: Family History  Problem Relation Age of Onset   Hypertension Mother    Atrial fibrillation Mother    Heart attack Mother    Hypertension Father    Stomach cancer Father    Diabetes Father    Liver cancer Father    Diabetes Sister    Hypertension Sister     Physical Examination: Vitals:   11/05/22 1028 11/05/22 1033  BP: (!) 162/107 (!) 167/108  Pulse: 79 82    General: Patient is well developed, well nourished, calm, collected, and in no apparent distress. Attention to examination is appropriate.  Respiratory: Patient is breathing without any difficulty.   NEUROLOGICAL:     Awake, alert, oriented to person, place, and time.  Speech is clear and fluent. Fund of knowledge is appropriate.   Cranial Nerves: Pupils equal round and reactive to light.  Facial tone is symmetric.    She has mild  diffuse trapezial tenderness right > left with some right scapular tenderness.   No abnormal lesions on exposed skin.   Strength: Side Biceps Triceps Deltoid Interossei Grip Wrist Ext. Wrist Flex.  R '5 5 5 '$ +4 +'4 5 5  '$ L '5 5 5 5 5 5 5   '$ Side Iliopsoas Quads Hamstring PF DF EHL  R +'4 5 5 5 5 5  '$ L '5 5 5 5 5 5   '$ Reflexes are 2+ and symmetric at the biceps, triceps, brachioradialis, patella and achilles.   Hoffman's is absent.  Clonus is not present.   Bilateral upper and lower extremity sensation is intact to light touch.     She has slow gait and ambulates with a walker.    Medical Decision Making  Imaging: MRI of cervical spine dated 04/24/21:  FINDINGS: Alignment: No significant listhesis.   Vertebrae: Vertebral body heights maintained apart from degenerative endplate irregularity. STIR hyperintense lesion of T2. Suspect minimal internal fat and is probably reflects a hemangioma. No substantial marrow edema.   Cord: No abnormal signal.   Posterior Fossa, vertebral arteries, paraspinal tissues: Unremarkable.   Disc levels:   C2-C3: Disc bulge with endplate osteophytes. Facet hypertrophy. No canal stenosis. Mild right foraminal stenosis. No left foraminal stenosis.   C3-C4: Disc bulge with endplate osteophytes. Uncovertebral and facet hypertrophy. Minor canal stenosis. Moderate to marked right and moderate left foraminal stenosis.   C4-C5: Disc bulge with endplate osteophytes. Uncovertebral and left facet hypertrophy. No canal stenosis. No right foraminal stenosis. Moderate left foraminal stenosis.   C5-C6: Disc bulge with endplate osteophytes. Uncovertebral and facet hypertrophy. No canal or foraminal stenosis.   C6-C7: Disc bulge with endplate osteophytes. Uncovertebral and facet hypertrophy. No canal or foraminal stenosis.   C7-T1: Facet hypertrophy. No canal stenosis. Mild foraminal stenosis.   IMPRESSION: Multilevel degenerative changes as detailed above.  There is significant facet arthropathy throughout. No high-grade canal stenosis. Right foraminal narrowing is greatest at C3-C4.     Electronically Signed   By: Malachi Carl  Patel M.D.   On: 04/25/2021 09:04    I have personally reviewed the images and agree with the above interpretation.  Assessment and Plan: Ms. Nielsen is a pleasant 72 y.o. female with history of chronic neck pain that has been worse over last year. She has constant neck pain that radiates to right shoulder blade with occasional right arm pain into her entire hand. She notes weakness in right side since her stroke in July.   Minimal improvement in right sided neck pain with right C3-4 TFESI 11/03/22 by Dr. Alba Nielsen.   Cervical MRI from 2022 showed cervical spondylosis/DDD with multilevel foraminal stenosis worse at C3-C4. A lot of her pain appears more myofascial in nature.  Treatment options discussed with patient and following plan made:   - Will update cervical MRI scan to evaluate chronic neck pain and intermittent right arm pain x years. No improvement with time, injections, HHPT, or medications.  - Depending on MRI results, may need to revisit outpatient PT for cervical spine and consider dry needling, myofasical release.  - May also consider cervical facet and/or TPI with Dr. Alba Nielsen.  - Will set up phone visit to review MRI once I have the results.   I spent a total of 30 minutes in face-to-face and non-face-to-face activities related to this patient's care today including review of outside records, review of imaging, review of symptoms, physical exam, discussion of differential diagnosis, discussion of treatment options, and documentation.   Thank you for involving me in the care of this patient.   Geronimo Boot PA-C Dept. of Neurosurgery

## 2022-11-05 ENCOUNTER — Encounter: Payer: Self-pay | Admitting: Orthopedic Surgery

## 2022-11-05 ENCOUNTER — Ambulatory Visit (INDEPENDENT_AMBULATORY_CARE_PROVIDER_SITE_OTHER): Payer: Medicare HMO | Admitting: Orthopedic Surgery

## 2022-11-05 VITALS — BP 128/78 | HR 82 | Ht 64.0 in | Wt 211.8 lb

## 2022-11-05 DIAGNOSIS — M542 Cervicalgia: Secondary | ICD-10-CM

## 2022-11-05 DIAGNOSIS — M47812 Spondylosis without myelopathy or radiculopathy, cervical region: Secondary | ICD-10-CM

## 2022-11-05 DIAGNOSIS — M5030 Other cervical disc degeneration, unspecified cervical region: Secondary | ICD-10-CM | POA: Diagnosis not present

## 2022-11-05 NOTE — Patient Instructions (Addendum)
It was so nice to see you today, I am sorry that you are hurting so much.   You have some wear and tear in your neck (arthritis) and this may be causing some of your pain.   I want to get an updated MRI scan of your neck to look into things further. We will get this approved and they will call you to schedule it at [facility].   Once I get the MRI results back, we will set up a phone visit to review them.   Please do not hesitate to call if you have any questions or concerns. You can also message me in [place].   Geronimo Boot PA-C [phone]

## 2022-11-09 ENCOUNTER — Ambulatory Visit (INDEPENDENT_AMBULATORY_CARE_PROVIDER_SITE_OTHER): Payer: Medicare HMO | Admitting: Pharmacist

## 2022-11-09 DIAGNOSIS — E1165 Type 2 diabetes mellitus with hyperglycemia: Secondary | ICD-10-CM

## 2022-11-09 NOTE — Chronic Care Management (AMB) (Signed)
Chronic Care Management CCM Pharmacy Note  11/09/2022 Name:  Rachel Nielsen MRN:  [id number] DOB:  November 13, 1950   Subjective: Rachel Nielsen is an 72 y.o. year old female who is a primary patient of Jearld Fenton, NP.  The CCM team was consulted for assistance with disease management and care coordination needs.    Engaged with patient by telephone for follow up visit for pharmacy case management and/or care coordination services.   Objective:  Medications Reviewed Today     Reviewed by Berdine Addison, RN (Registered Nurse) on 11/05/22 at 1054  Med List Status: <None>   Medication Order Taking? Sig Documenting Provider Last Dose Status Informant  ACCU-CHEK GUIDE test strip [id number] Yes Use to check blood sugar up to twice a day as directed Jearld Fenton, NP Taking Active   Accu-Chek Softclix Lancets lancets [id number] Yes Use to check blood sugar up to twice a day as directed Jearld Fenton, NP Taking Active   albuterol (PROVENTIL) (2.5 MG/3ML) 0.083% nebulizer solution [id number] Yes Inhale 3 mLs (2.5 mg total) into the lungs every 4 (four) hours as needed for wheezing or shortness of breath.  Patient taking differently: Inhale 2.5 mg into the lungs every 6 (six) hours as needed for wheezing or shortness of breath.   Lorella Nimrod, MD Taking Active Self, Multiple Informants  albuterol (VENTOLIN HFA) 108 (90 Base) MCG/ACT inhaler [id number] Yes Inhale 2-4 puffs by mouth every 4 hours as needed for wheezing, cough, and/or shortness of breath  Patient taking differently: 2 puffs every 6 (six) hours as needed.   Malfi, Lupita Raider, FNP Taking Active Multiple Informants, Self  ALPRAZolam Duanne Moron) 1 MG tablet [id number] Yes Take 1 mg by mouth 3 (three) times daily as needed for anxiety or sleep. [provider] Taking Active Self  amLODipine (NORVASC) 5 MG tablet [id number] Yes Take 1 tablet (5 mg total) by mouth daily. Jearld Fenton, NP Taking Active   Asenapine Maleate 10 MG SUBL  [id number] Yes Place 5 mg under the tongue at bedtime. [provider] Taking Active Multiple Informants, Self  aspirin 81 MG EC tablet [id number] Yes Take 1 tablet (81 mg total) by mouth daily. Swallow whole. Jearld Fenton, NP Taking Active Self, Multiple Informants  bisoprolol (ZEBETA) 10 MG tablet [id number] Yes Take 1 tablet (10 mg total) by mouth daily. Jearld Fenton, NP Taking Active   buPROPion (WELLBUTRIN XL) 300 MG 24 hr tablet [id number] Yes Take 1 tablet (300 mg total) by mouth daily. Carrie Mew, MD Taking Active Multiple Informants, Self  busPIRone (BUSPAR) 15 MG tablet [id number] Yes Take 15 mg by mouth 2 (two) times daily. [provider] Taking Active Self  clopidogrel (PLAVIX) 75 MG tablet [id number] Yes Take 75 mg by mouth daily. [provider] Taking Active   docusate sodium (COLACE) 100 MG capsule [id number] Yes Take 100 mg by mouth daily. [provider] Taking Active Self, Multiple Informants  Dulaglutide 3 MG/0.5ML SOPN [id number] Yes Inject 0.5 mLs (3 mg total) subcutaneously once a week [provider] Taking Active Self, Multiple Informants  DULoxetine (CYMBALTA) 60 MG capsule [id number] Yes Take 60 mg by mouth daily. [provider] Taking Active   famotidine (PEPCID) 20 MG tablet [id number] Yes Take 20 mg by mouth 2 (two) times daily. [provider] Taking Active Multiple Informants, Self  FARXIGA 10 MG TABS tablet [id number] Yes Take 10 mg by mouth daily. [provider] Taking Active Multiple Informants, Self  fluticasone (FLONASE) 50 MCG/ACT nasal spray [id number] Yes Place 1 spray into both nostrils daily. Jearld Fenton, NP Taking Active   fluticasone-salmeterol (ADVAIR) 250-50 MCG/ACT AEPB [id number] Yes Inhale 1 puff into the lungs 2 (two) times daily. [provider] Taking Active Multiple Informants, Self  gabapentin (NEURONTIN) 600 MG tablet [id number] Yes Take 600-900 mg by mouth 3  (three) times daily. 600 mg AM, 600 mg pm and 900 mg at bedtime [provider] Taking Active Multiple Informants, Self  GLOBAL EASE INJECT PEN NEEDLES 32G X 4 MM MISC [id number] Yes Use as directed with insulin Kendell Bane, NP Taking Active Multiple Informants, Self  insulin aspart (NOVOLOG) 100 UNIT/ML FlexPen [id number] Yes Inject 20 Units into the skin 3 (three) times daily with meals. [provider] Taking Active Self, Multiple Informants  Insulin Glargine Solostar (LANTUS) 100 UNIT/ML Solostar Pen [id number] Yes Inject 10 Units into the skin at bedtime. [provider] Taking Active Self, Multiple Informants  Insulin Pen Needle (BD PEN NEEDLE NANO U/F) 32G X 4 MM MISC [id number] Yes Inject 1 Device into the skin daily. Jearld Fenton, NP Taking Active Multiple Informants, Self  Insulin Syringe-Needle U-100 (INSULIN SYRINGE 1CC/31GX5/16") 31G X 5/16" 1 ML MISC [id number] Yes 1 Syringe by Does not apply route in the morning, at noon, in the evening, and at bedtime. Jearld Fenton, NP Taking Active Multiple Informants, Self  lactulose (CHRONULAC) 10 GM/15ML solution [id number] Yes Take 30 g by mouth 2 (two) times daily. [provider] Taking Active   LINZESS 290 MCG CAPS capsule [id number] Yes Take 1 capsule (290 mcg total) by mouth daily before breakfast. Jearld Fenton, NP Taking Active Self, Multiple Informants  lurasidone (LATUDA) 40 MG TABS tablet [id number] Yes Take 40 mg by mouth daily. [provider] Taking Active Self  meloxicam (MOBIC) 15 MG tablet [id number] Yes Take 15 mg by mouth daily. [provider] Taking Active   metFORMIN (GLUCOPHAGE) 1000 MG tablet [id number] Yes Take 1,000 mg by mouth 2 (two) times daily with a meal. [provider] Taking Active Self, Multiple Informants  mirabegron ER (MYRBETRIQ) 50 MG TB24 tablet [id number] Yes Take 1 tablet (50 mg total) by mouth daily. Bjorn Loser, MD Taking Active Self,  Multiple Informants  montelukast (SINGULAIR) 10 MG tablet [id number] Yes Take 1 tablet (10 mg total) by mouth daily. Carrie Mew, MD Taking Active Multiple Informants, Self  Olopatadine HCl 0.2 % SOLN [id number]  Place 1 drop into both eyes daily. Verl Bangs, FNP  Active Multiple Informants, Self  pantoprazole (PROTONIX) 40 MG tablet [id number] Yes Take 1 tablet (40 mg total) by mouth 2 (two) times daily.  Patient taking differently: Take 40 mg by mouth daily.   Jearld Fenton, NP Taking Active   predniSONE (DELTASONE) 5 MG tablet [id number] Yes Take 5 mg by mouth daily. [provider] Taking Active   pregabalin (LYRICA) 75 MG capsule [id number] Yes Take 100 mg by mouth 3 (three) times daily. [provider] Taking Active Self, Multiple Informants  rosuvastatin (CRESTOR) 10 MG tablet [id number] Yes Take 1 tablet (10 mg total) by mouth daily. Jearld Fenton, NP Taking Active   TRAVATAN Z 0.004 % SOLN ophthalmic solution [id number] Yes Place 1 drop into both eyes at bedtime. Carrie Mew, MD Taking Active Multiple Informants, Self            Pertinent Labs:  Lab Results  Component Value Date   HGBA1C 7.1 (A) 06/30/2022  Lab Results  Component Value Date   CHOL 68 05/15/2022   HDL 32 (L) 05/15/2022   LDLCALC 24 05/15/2022   TRIG 59 05/15/2022   CHOLHDL 2.1 05/15/2022   Lab Results  Component Value Date   CREATININE 1.04 (H) 05/14/2022   BUN 12 05/14/2022   NA 143 05/14/2022   K 4.3 05/14/2022   CL 107 05/14/2022   CO2 27 05/14/2022    SDOH:  (Social Determinants of Health) assessments and interventions performed:  SDOH Interventions    [place] Coordination from 07/24/2022 in [place] from 06/22/2022 in [place] Management from 01/12/2022 in [place] from 12/18/2021 in [facility] Office Visit from 07/09/2021 in [place] from 12/31/2020 in [place]  SDOH Interventions        Food Insecurity Interventions -- Intervention Not Indicated Intervention Not Indicated Intervention Not Indicated -- --  Housing Interventions -- Intervention Not Indicated Intervention Not Indicated Intervention Not Indicated -- --  Transportation Interventions -- Intervention Not Indicated Intervention Not Indicated Intervention Not Indicated -- --  Utilities Interventions Intervention Not Indicated -- -- -- -- --  Depression Interventions/Treatment  -- -- -- Currently on Treatment, Counseling Currently on Treatment Counseling, Currently on Treatment  Financial Strain Interventions -- -- Intervention Not Indicated Intervention Not Indicated -- --  Physical Activity Interventions -- -- Other (Comments)  [has an exercise bike but has not been using it] -- -- --  Stress Interventions -- -- Intervention Not Indicated -- -- --  Social Connections Interventions -- -- Other (Comment)  [has good support system. 4 children] Intervention Not Indicated -- --       CCM Care Plan  Review of patient past medical history, allergies, medications, health status, including review of consultants reports, laboratory and other test data, was performed as part of comprehensive evaluation and provision of chronic care management services.   Care Plan : PharmD - Medication Management/T2DM  Updates made by Rennis Petty, RPH-CPP since 11/09/2022 12:00 AM     Problem: Disease Progression      Long-Range Goal: Disease Progression Prevented or Minimized   Start Date: 01/19/2022  Expected End Date: 04/19/2022  Priority: High  Note:   Current Barriers:  Unable to achieve control of blood sugar  Chronic Disease Management support and education needs related to managing HTN, GERD, major depressive disorder, Type II DM, history of CVA, diabetic  neuropathy, GAD, OSA, HLD, constipation, obesity, COPD  Pharmacist Clinical Goal(s):  patient will achieve adherence to monitoring guidelines and medication adherence to achieve therapeutic efficacy achieve control of blood sugar as evidenced by A1C  through collaboration with PharmD and provider.    Interventions: 1:1 collaboration with Jearld Fenton, NP regarding development and update of comprehensive plan of care as evidenced by provider attestation and co-signature Inter-disciplinary care team collaboration (see longitudinal plan of care) Perform chart review Patient seen for Office Visit with Endocrinologist on 10/13/2022. Provider advised patient: Will obtain A1C OK to continue to HOLD both the Lantus and NovoLog insulins  From review of telephone note on 10/23/2022, Endocrinologist placed order for Lantus 10 units nightly at bedtime Patient declines request to review medications with Clinical Pharmacist today  Medication Adherence: Patient uses weekly pillbox to organize her medications/as adherence aid Have encouraged patient to also use latest medication list as aid when filling weekly  pillbox to ensure all current medications are included  T2DM: Patient followed by [facility] Endocrinology Uncontrolled; current treatment: Metformin 1000 mg twice daily Trulicity 3 mg weekly on Fridays Lantus 11 units QHS  Reports restarted Lantus at dose of 10 units daily as directed by Endocrinologist, but increased based on her home readings following direction from provider Farxiga 10 mg daily Denies keeping log of blood sugar results, but recalls recent blood sugar readings ranging 138-200 Current dietary patterns: breakfast: crackers; lunch: [employer] frozen dinners; supper: pimento cheese sandwich; drinks: water and diet soda Encourage patient to have regular well-balanced meals spread throughout the day, while limiting carbohydrate portion sizes Reports currently unable to receive  a new Freestyle Libre 2 Continuous Glucose Monitor (CGM) reader through her [employer] coverage as she is having a dispute with previous DME company who denies having received the first [employer] 2 reader that she shipped (patient felt it was malfunctioning) Reports she does have a supply of [employer] 2 sensors Note certain smart phones may be used in place of reader device Patient has an Armed forces operational officer phone, but is not aware of what model or operating system her phone has Reports her son can assist her with determining smart phone compatibility, but is not currently home Patient declines to have me call her son, rather states she will have him help her with determining compatibility and, if possible, setting this up in place of reader Provide patient with phone number for [facility] support [phone]) Patient monitoring with glucometer while waiting to restart using CGM Statin: rosuvastatin 10 mg daily Encourage patient to monitor home blood as directed by Endocrinologist and to follow up with clinic for readings outside of established parameters or new symptoms   Patient Goals/Self-Care Activities patient will:  - take medications as prescribed as evidenced by patient report and record review - check glucose, document, and provide at future appointments - check blood pressure, document, and provide at future appointments       Plan: Telephone follow up appointment with care management team member scheduled for:  12/14/2022 at 1:45 pm  Wallace Cullens, PharmD, [place] [phone]

## 2022-11-09 NOTE — Patient Instructions (Signed)
Visit Information  Thank you for taking time to visit with me today. Please don't hesitate to contact me if I can be of assistance to you before our next scheduled telephone appointment.  Following are the goals we discussed today:   Goals Addressed             This Visit's Progress    Pharmacy Goals       Our goal A1c is less than 7%. This corresponds with fasting sugars less than 130 and 2 hour after meal sugars less than 180. Please check your blood sugar and keep a log of the results  Our goal bad cholesterol, or LDL, is less than 70 . This is why it is important to continue taking your rosuvastatin.  Feel free to call me with any questions or concerns. I look forward to our next call!   Wallace Cullens, PharmD, [place] [phone]         Our next appointment is by telephone on 12/14/2022 at 1:45 pm  Please call the care guide team at [phone] if you need to cancel or reschedule your appointment.    Patient verbalizes understanding of instructions and care plan provided today and agrees to view in [place]. Active MyChart status and patient understanding of how to access instructions and care plan via MyChart confirmed with patient.

## 2022-11-17 DIAGNOSIS — M542 Cervicalgia: Secondary | ICD-10-CM | POA: Diagnosis not present

## 2022-11-17 DIAGNOSIS — M9931 Osseous stenosis of neural canal of cervical region: Secondary | ICD-10-CM | POA: Diagnosis not present

## 2022-11-17 DIAGNOSIS — M5412 Radiculopathy, cervical region: Secondary | ICD-10-CM | POA: Diagnosis not present

## 2022-11-23 ENCOUNTER — Ambulatory Visit
Admission: RE | Admit: 2022-11-23 | Discharge: 2022-11-23 | Disposition: A | Discharge status: Home / Self Care | Payer: Medicare HMO | Source: Ambulatory Visit | Attending: Orthopedic Surgery | Admitting: Orthopedic Surgery

## 2022-11-23 DIAGNOSIS — M4802 Spinal stenosis, cervical region: Secondary | ICD-10-CM | POA: Diagnosis not present

## 2022-11-23 DIAGNOSIS — M5030 Other cervical disc degeneration, unspecified cervical region: Secondary | ICD-10-CM | POA: Insufficient documentation

## 2022-11-23 DIAGNOSIS — M47812 Spondylosis without myelopathy or radiculopathy, cervical region: Secondary | ICD-10-CM | POA: Diagnosis not present

## 2022-11-30 NOTE — Progress Notes (Unsigned)
Telephone Visit- Progress Note: Referring Physician:  No referring provider defined for this encounter.  Primary Physician:  Jearld Fenton, NP  This visit was performed via telephone.  Patient location: home Provider location: office  I spent a total of 20 minutes non-face-to-face activities for this visit on the date of this encounter including review of current clinical condition and response to treatment.    Patient has given verbal consent to this telephone visits and we reviewed the limitations of a telephone visit. Patient wishes to proceed.    Chief Complaint:  review cervical MRI   History of Present Illness: Rachel Nielsen is a 72 y.o. female has a history of OSA, GERD, DM with neuropathy, hyperlipidemia, CKD 3, depression, GAD, emphysema, and history of CVA.   History of chronic neck pain that has been worse over last year. She had some improvement in pain after right C3-4 TFESI 11/03/22 by Dr. Alba Destine.   Last seen by me on 11/05/22 and I ordered an MRI of her cervical spine.   She continues with constant pain her neck with radiation into her right shoulder blade. She has no current right arm pain. Had f/u with Dr. Alba Destine on 11/17/22 and said she had 60-70% improvement with injection.    History of multiple strokes. Last in July and she has some right sided weakness.  She is on PLAVIX.    She vapes with nicotine.    Conservative measures:  Physical therapy: did HHPT with [facility] after stroke (worked on neck as well)  Multimodal medical therapy including regular antiinflammatories: neurontin, lyrica, mobic  Injections:  -s/p right C3-4 TFESI 09/09/2021 with 75% relief -s/p right C3-4 TFESI 03/10/2022 with 50% relief  -s/p right C3-4 TFESI 11/03/22   Past Surgery: no spinal surgery  Exam: No exam done as this was a telephone encounter.     Imaging: Cervical MRI dated 11/23/22:  FINDINGS: Alignment: Reversal of the normal cervical lordosis. Trace degenerative  anterolisthesis of C3 on C4 and C4 on C5.   Vertebrae: Vertebral body height maintained without acute or chronic fracture. Bone marrow signal intensity within normal limits. Small benign hemangioma within the T2 vertebral body noted. No other discrete or worrisome osseous lesions. No abnormal marrow edema.   Cord: Normal signal and morphology.   Posterior Fossa, vertebral arteries, paraspinal tissues: Unremarkable.   Disc levels:   C2-C3: Mild disc bulge with endplate spurring. Moderate right greater than left facet hypertrophy. No spinal stenosis. Moderate right with mild left C3 foraminal narrowing. Appearance is similar.   C3-C4: Disc bulge with bilateral uncovertebral spurring. Moderate right worse than left facet arthrosis. Resultant mild spinal stenosis. Moderate bilateral C4 foraminal narrowing. Appearance is similar.   C4-C5: Mild disc bulge with uncovertebral spurring. Moderate left with mild right facet arthrosis. No more than mild spinal stenosis. Severe left C5 foraminal narrowing. Right neural foramen remains patent. Appearance is similar.   C5-C6: Mild degenerative intervertebral disc space narrowing. Diffuse disc bulge with endplate and uncovertebral spurring. Flattening and partial effacement of the ventral thecal sac. Minimal cord flattening without cord signal changes. No more than mild spinal stenosis. Mild to moderate right C6 foraminal narrowing. Left neural foramen remains patent. Appearance is similar.   C6-C7: Mild degenerative intervertebral disc space narrowing with diffuse disc bulge and bilateral uncovertebral spurring. Flattening and partial effacement of the ventral thecal sac with no more than mild spinal stenosis. Mild to moderate bilateral C7 foraminal narrowing. Appearance is similar.   C7-T1: Minimal  disc bulge. Moderate left with mild right facet arthrosis. No significant spinal stenosis. Mild bilateral C8 foraminal narrowing. Appearance  is stable.   IMPRESSION: 1. Overall, little interval change in appearance of the cervical spine as compared to 04/24/2021. 2. Multilevel cervical spondylosis with resultant mild diffuse spinal stenosis at C3-4 through C6-7. Associated moderate right C3 and bilateral C4 foraminal stenosis, severe left C5 foraminal narrowing, with mild to moderate right C6 and bilateral C7 foraminal stenosis. 3. Moderate multilevel facet arthrosis, most pronounced at C2-3 and C3-4. Findings could contribute to neck pain.   Electronically Signed   By: Jeannine Boga M.D.   On: 11/24/2022 04:28   I have personally reviewed the images and agree with the above interpretation.  Assessment and Plan: [name] is a pleasant 72 y.o. female with history of chronic neck pain that has been worse over last year. She has constant pain her neck with radiation into her right shoulder blade. She has no current right arm pain. Had f/u with Dr. Alba Destine on 11/17/22 and said she had 60-70% improvement with injection.    History of multiple strokes. Last in July and she has some right sided weakness.   Updated cervical MRI shows mild central stenosis C3-C7. She has moderate right C3 and bilateral C4 foraminal stenosis along with mild to moderate right C6 and bilateral C7 foraminal stenosis. She diffuse cervical spondylosis as well.    A lot of her pain appeared more myofascial in nature at her last visit.   Treatment options discussed with patient and following plan made:   - Order for HHPT for cervical spine. She is home bound and does not drive.  - Agree with C7-T1 IL ESI with Dr. Alba Destine. Patient is unsure if this is being scheduled. Will check with Dr. Alba Destine and let her know.  - Of note, she is on PLAVIX.  - Follow up with me in 6-8 weeks for recheck.   Geronimo Boot PA-C Neurosurgery

## 2022-12-01 ENCOUNTER — Encounter: Payer: Self-pay | Admitting: Orthopedic Surgery

## 2022-12-01 ENCOUNTER — Ambulatory Visit (INDEPENDENT_AMBULATORY_CARE_PROVIDER_SITE_OTHER): Payer: Medicare HMO | Admitting: Orthopedic Surgery

## 2022-12-01 DIAGNOSIS — M47812 Spondylosis without myelopathy or radiculopathy, cervical region: Secondary | ICD-10-CM

## 2022-12-01 DIAGNOSIS — M5030 Other cervical disc degeneration, unspecified cervical region: Secondary | ICD-10-CM | POA: Diagnosis not present

## 2022-12-01 DIAGNOSIS — M542 Cervicalgia: Secondary | ICD-10-CM

## 2022-12-02 DIAGNOSIS — E1165 Type 2 diabetes mellitus with hyperglycemia: Secondary | ICD-10-CM

## 2022-12-09 ENCOUNTER — Other Ambulatory Visit: Payer: Self-pay | Admitting: Physical Medicine & Rehabilitation

## 2022-12-09 DIAGNOSIS — M542 Cervicalgia: Secondary | ICD-10-CM

## 2022-12-11 ENCOUNTER — Ambulatory Visit: Payer: Self-pay

## 2022-12-11 ENCOUNTER — Ambulatory Visit (INDEPENDENT_AMBULATORY_CARE_PROVIDER_SITE_OTHER): Payer: Medicare HMO

## 2022-12-11 DIAGNOSIS — I10 Essential (primary) hypertension: Secondary | ICD-10-CM

## 2022-12-11 DIAGNOSIS — E1165 Type 2 diabetes mellitus with hyperglycemia: Secondary | ICD-10-CM

## 2022-12-11 NOTE — Patient Instructions (Signed)
Please call the care guide team at [phone] if you need to cancel or reschedule your appointment.   If you are experiencing a Mental Health or [place] or need someone to talk to, please call the Suicide and Crisis Lifeline: 988 call the [place] National Suicide Prevention Lifeline: 670-751-1732 or TTY: (207)813-8650 TTY (986) 302-0331) to talk to a trained counselor call 1-800-273-TALK (toll free, 24 hour hotline)   Following is a copy of the CCM Program Consent:  CCM service includes personalized support from designated clinical staff supervised by the physician, including individualized plan of care and coordination with other care providers 24/7 contact phone numbers for assistance for urgent and routine care needs. Service will only be billed when office clinical staff spend 20 minutes or more in a month to coordinate care. Only one practitioner may furnish and bill the service in a calendar month. The patient may stop CCM services at amy time (effective at the end of the month) by phone call to the office staff. The patient will be responsible for cost sharing (co-pay) or up to 20% of the service fee (after annual deductible is met)  Following is a copy of your full provider care plan:   Goals Addressed             This Visit's Progress    CCM Expected Outcome:  Monitor, Self-Manage and Reduce Symptoms of Diabetes       Current Barriers:  Knowledge Deficits related to hypoglycemia and how to effectively manage low blood sugars Chronic Disease Management support and education needs related to effective management of DM  Lab Results  Component Value Date   HGBA1C 7.1 (A) 06/30/2022    Planned Interventions: Provided education to patient about basic DM disease process; Reviewed medications with patient and discussed importance of medication adherence. The patient is compliant with medications. She wants to discuss with her endocrinologist about adding meal  coverage back because her blood sugars are bouncing up and down. Education and review of dietary habits and insulin coverage;        Reviewed prescribed diet with patient heart healthy/ADA diet. Review of heart healthy/ADA diet and the patient states she is monitoring what she eats but sometimes her blood sugars are still up and down. She has multiple chronic conditions that she feels impacts her health and well being; Counseled on importance of regular laboratory monitoring as prescribed. Has regular lab work drawn. Education and support given;        Discussed plans with patient for ongoing care management follow up and provided patient with direct contact information for care management team;      Provided patient with written educational materials related to hypo and hyperglycemia and importance of correct treatment. Education given and discussed keeping glucose tablets and snacks around in the event of low blood sugars;       Reviewed scheduled/upcoming provider appointments including: 12-31-2022 at 11 am;         Advised patient, providing education and rationale, to check cbg three times daily and when you have symptoms of low or high blood sugar and record. Is checking more frequently since she had the hypoglycemic event at the end of November. Has seen the endocrinologist and has been given parameters to call and when to take insulin. States her blood sugars are still up and down. She continues to see the endocrinologist and will follow up soon for recommendations on meal coverage insulin     call provider for findings  outside established parameters. The patient has parameters to go by and knows when to call the endocrinologist for changes.;       Referral made to pharmacy team for assistance with ongoing support and education for medication and management of medications effectively. Works with the [place] [facility] on a regular basis for effective management of DM;       Review of patient status, including  review of consultants reports, relevant laboratory and other test results, and medications completed;       Advised patient to discuss changes in DM, questions or concerns with provider;      Screening for signs and symptoms of depression related to chronic disease state;        Assessed social determinant of health barriers;    Saw the eye doctor recently and her pressure was up and her tear ducts were blocked. She has been back a couple of times for evaluation and treatment.       Symptom Management: Take medications as prescribed   Attend all scheduled provider appointments Call provider office for new concerns or questions  call the Suicide and Crisis Lifeline: 988 call the [place] National Suicide Prevention Lifeline: 414-859-0980 or TTY: 321-335-4128 TTY 830-252-6401) to talk to a trained counselor call 1-800-273-TALK (toll free, 24 hour hotline) if experiencing a Mental Health or [place]  check feet daily for cuts, sores or redness trim toenails straight across manage portion size wash and dry feet carefully every day wear comfortable, cotton socks wear comfortable, well-fitting shoes  Follow Up Plan: Telephone follow up appointment with care management team member scheduled for: 02-12-2023 at 1 pm       CCM Expected Outcome:  Monitor, Self-Manage, and Reduce Symptoms of Hypertension       Current Barriers:  Knowledge Deficits related to normal blood pressure readings and the need to maintain control of blood pressure to prevent any adverse effects or increase risk of heart attack and stroke Chronic Disease Management support and education needs related to effective management of HTN BP Readings from Last 3 Encounters:  11/05/22 128/78  06/30/22 133/73  05/15/22 129/81     Planned Interventions: Evaluation of current treatment plan related to hypertension self management and patient's adherence to plan as established by provider. The patients blood pressures  are stable. The patient states she is still having a lot of pain and discomfort in her neck and back. She was able to talk to the [facility] today and vent about her health and well being and things concerning her son. Recommended she talk to her psychiatrist for more assistance. She is concerned about her overall conditions and how she is feeling. Reflective listening and support given;   Provided education to patient re: stroke prevention, s/s of heart attack and stroke; Reviewed prescribed diet heart healthy/ADA diet. Education provided. The patient tries to adhere to a heart healthy/ADA diet Reviewed medications with patient and discussed importance of compliance. The patient verbalized compliance with medications. Denies any medications concerns today. Works with [facility] on a regular basis. Has an upcoming appointment with the pharm D. Reminder provided today;  Discussed plans with patient for ongoing care management follow up and provided patient with direct contact information for care management team; Advised patient, providing education and rationale, to monitor blood pressure daily and record, calling PCP for findings outside established parameters;  Reviewed scheduled/upcoming provider appointments including: 12-31-2022 at 11 am with the pcp. Encouraged the patient to discuss her concerns with the  provider at her upcoming appointment Advised patient to discuss changes in her HTN or heart health with provider; Provided education on prescribed diet heart healthy/ADA. Review and education provided;  Discussed complications of poorly controlled blood pressure such as heart disease, stroke, circulatory complications, vision complications, kidney impairment, sexual dysfunction;  Screening for signs and symptoms of depression related to chronic disease state;  Assessed social determinant of health barriers;  The patient has noticed changes in her hearing and feels she needs to have a hearing test in the new  year.   Symptom Management: Take medications as prescribed   Attend all scheduled provider appointments Call provider office for new concerns or questions  call the Suicide and Crisis Lifeline: 988 call the [place] National Suicide Prevention Lifeline: (208)201-0583 or TTY: 450-672-7894 TTY 229-301-5370) to talk to a trained counselor call 1-800-273-TALK (toll free, 24 hour hotline) if experiencing a Mental Health or [place]  check blood pressure weekly learn about high blood pressure take blood pressure log to all doctor appointments call doctor for signs and symptoms of high blood pressure develop an action plan for high blood pressure keep all doctor appointments take medications for blood pressure exactly as prescribed report new symptoms to your doctor  Follow Up Plan: Telephone follow up appointment with care management team member scheduled for:02-19-2023 at 1 pm          Patient verbalizes understanding of instructions and care plan provided today and agrees to view in [place]. Active MyChart status and patient understanding of how to access instructions and care plan via MyChart confirmed with patient.     Telephone follow up appointment with care management team member scheduled for: 02-12-2023 at 1 pm

## 2022-12-11 NOTE — Chronic Care Management (AMB) (Signed)
error 

## 2022-12-11 NOTE — Chronic Care Management (AMB) (Signed)
Chronic Care Management   CCM RN Visit Note  12/11/2022 Name: Rachel Nielsen MRN: [id number] DOB: 1951-05-31  Subjective: Rachel Nielsen is a 72 y.o. year old female who is a primary care patient of Rachel Fenton, NP. The patient was referred to the Chronic Care Management team for assistance with care management needs subsequent to provider initiation of CCM services and plan of care.    Today's Visit:  Engaged with patient by telephone for follow up visit.     SDOH Interventions Today    Flowsheet Row Most Recent Value  SDOH Interventions   Stress Interventions Other (Comment)  [the patient is stressed over her neck pain and discomfort and her son sometimes gets upset with her because she forgets and repeats herself a lot]         Goals Addressed             This Visit's Progress    CCM Expected Outcome:  Monitor, Self-Manage and Reduce Symptoms of Diabetes       Current Barriers:  Knowledge Deficits related to hypoglycemia and how to effectively manage low blood sugars Chronic Disease Management support and education needs related to effective management of DM  Lab Results  Component Value Date   HGBA1C 7.1 (A) 06/30/2022    Planned Interventions: Provided education to patient about basic DM disease process; Reviewed medications with patient and discussed importance of medication adherence. The patient is compliant with medications. She wants to discuss with her endocrinologist about adding meal coverage back because her blood sugars are bouncing up and down. Education and review of dietary habits and insulin coverage;        Reviewed prescribed diet with patient heart healthy/ADA diet. Review of heart healthy/ADA diet and the patient states she is monitoring what she eats but sometimes her blood sugars are still up and down. She has multiple chronic conditions that she feels impacts her health and well being; Counseled on importance of regular laboratory monitoring as  prescribed. Has regular lab work drawn. Education and support given;        Discussed plans with patient for ongoing care management follow up and provided patient with direct contact information for care management team;      Provided patient with written educational materials related to hypo and hyperglycemia and importance of correct treatment. Education given and discussed keeping glucose tablets and snacks around in the event of low blood sugars;       Reviewed scheduled/upcoming provider appointments including: 12-31-2022 at 11 am;         Advised patient, providing education and rationale, to check cbg three times daily and when you have symptoms of low or high blood sugar and record. Is checking more frequently since she had the hypoglycemic event at the end of November. Has seen the endocrinologist and has been given parameters to call and when to take insulin. States her blood sugars are still up and down. She continues to see the endocrinologist and will follow up soon for recommendations on meal coverage insulin     call provider for findings outside established parameters. The patient has parameters to go by and knows when to call the endocrinologist for changes.;       Referral made to pharmacy team for assistance with ongoing support and education for medication and management of medications effectively. Works with the [place] [facility] on a regular basis for effective management of DM;       Review of  patient status, including review of consultants reports, relevant laboratory and other test results, and medications completed;       Advised patient to discuss changes in DM, questions or concerns with provider;      Screening for signs and symptoms of depression related to chronic disease state;        Assessed social determinant of health barriers;    Saw the eye doctor recently and her pressure was up and her tear ducts were blocked. She has been back a couple of times for evaluation and treatment.        Symptom Management: Take medications as prescribed   Attend all scheduled provider appointments Call provider office for new concerns or questions  call the Suicide and Crisis Lifeline: 988 call the [place] National Suicide Prevention Lifeline: (307)392-7610 or TTY: 6168850399 TTY 256-844-9035) to talk to a trained counselor call 1-800-273-TALK (toll free, 24 hour hotline) if experiencing a Mental Health or [place]  check feet daily for cuts, sores or redness trim toenails straight across manage portion size wash and dry feet carefully every day wear comfortable, cotton socks wear comfortable, well-fitting shoes  Follow Up Plan: Telephone follow up appointment with care management team member scheduled for: 02-12-2023 at 1 pm       CCM Expected Outcome:  Monitor, Self-Manage, and Reduce Symptoms of Hypertension       Current Barriers:  Knowledge Deficits related to normal blood pressure readings and the need to maintain control of blood pressure to prevent any adverse effects or increase risk of heart attack and stroke Chronic Disease Management support and education needs related to effective management of HTN BP Readings from Last 3 Encounters:  11/05/22 128/78  06/30/22 133/73  05/15/22 129/81     Planned Interventions: Evaluation of current treatment plan related to hypertension self management and patient's adherence to plan as established by provider. The patients blood pressures are stable. The patient states she is still having a lot of pain and discomfort in her neck and back. She was able to talk to the [facility] today and vent about her health and well being and things concerning her son. Recommended she talk to her psychiatrist for more assistance. She is concerned about her overall conditions and how she is feeling. Reflective listening and support given;   Provided education to patient re: stroke prevention, s/s of heart attack and stroke; Reviewed  prescribed diet heart healthy/ADA diet. Education provided. The patient tries to adhere to a heart healthy/ADA diet Reviewed medications with patient and discussed importance of compliance. The patient verbalized compliance with medications. Denies any medications concerns today. Works with [place] [facility] on a regular basis. Has an upcoming appointment with the pharm D. Reminder provided today;  Discussed plans with patient for ongoing care management follow up and provided patient with direct contact information for care management team; Advised patient, providing education and rationale, to monitor blood pressure daily and record, calling PCP for findings outside established parameters;  Reviewed scheduled/upcoming provider appointments including: 12-31-2022 at 11 am with the pcp. Encouraged the patient to discuss her concerns with the provider at her upcoming appointment Advised patient to discuss changes in her HTN or heart health with provider; Provided education on prescribed diet heart healthy/ADA. Review and education provided;  Discussed complications of poorly controlled blood pressure such as heart disease, stroke, circulatory complications, vision complications, kidney impairment, sexual dysfunction;  Screening for signs and symptoms of depression related to chronic disease state;  Assessed social  determinant of health barriers;  The patient has noticed changes in her hearing and feels she needs to have a hearing test in the new year.   Symptom Management: Take medications as prescribed   Attend all scheduled provider appointments Call provider office for new concerns or questions  call the Suicide and Crisis Lifeline: 988 call the [place] National Suicide Prevention Lifeline: (317) 040-3248 or TTY: 340-787-1183 TTY 513-145-3658) to talk to a trained counselor call 1-800-273-TALK (toll free, 24 hour hotline) if experiencing a Mental Health or [place]  check blood pressure  weekly learn about high blood pressure take blood pressure log to all doctor appointments call doctor for signs and symptoms of high blood pressure develop an action plan for high blood pressure keep all doctor appointments take medications for blood pressure exactly as prescribed report new symptoms to your doctor  Follow Up Plan: Telephone follow up appointment with care management team member scheduled for:02-19-2023 at 1 pm          Plan:Telephone follow up appointment with care management team member scheduled for:  02-12-2023 at 1 pm  Noreene Larsson RN, MSN, CCM RN Care Manager  Chronic Care Management Direct Number: [phone]

## 2022-12-14 ENCOUNTER — Ambulatory Visit: Payer: Medicare HMO | Admitting: Pharmacist

## 2022-12-14 ENCOUNTER — Encounter: Payer: Self-pay | Admitting: Neurology

## 2022-12-14 DIAGNOSIS — E1165 Type 2 diabetes mellitus with hyperglycemia: Secondary | ICD-10-CM

## 2022-12-14 NOTE — Patient Instructions (Signed)
Visit Information  Thank you for taking time to visit with me today. Please don't hesitate to contact me if I can be of assistance to you before our next scheduled telephone appointment.  Following are the goals we discussed today:   Goals Addressed             This Visit's Progress    Pharmacy Goals       Our goal A1c is less than 7%. This corresponds with fasting sugars less than 130 and 2 hour after meal sugars less than 180. Please check your blood sugar and keep a log of the results  Our goal bad cholesterol, or LDL, is less than 70 . This is why it is important to continue taking your rosuvastatin.  Feel free to call me with any questions or concerns. I look forward to our next call!   Wallace Cullens, PharmD, [place] [phone]         Our next appointment is by telephone on 03/15/2023 at 1:00 PM  Please call the care guide team at [phone] if you need to cancel or reschedule your appointment.    Patient verbalizes understanding of instructions and care plan provided today and agrees to view in [place]. Active MyChart status and patient understanding of how to access instructions and care plan via MyChart confirmed with patient.

## 2022-12-14 NOTE — Chronic Care Management (AMB) (Signed)
Chronic Care Management CCM Pharmacy Note  12/14/2022 Name:  Rachel Nielsen MRN:  [id number] DOB:  September 17, 1951   Subjective: Rachel Nielsen is an 72 y.o. year old female who is a primary patient of Jearld Fenton, NP.  The CCM team was consulted for assistance with disease management and care coordination needs.    Engaged with patient by telephone for follow up visit for pharmacy case management and/or care coordination services.   Objective:  Medications    Reviewed by Vanita Ingles, RN (Case Manager) on 12/11/22 at 44  Med List Status: <None>   Medication Order Taking? Sig Documenting Provider Last Dose Status Informant  ACCU-CHEK GUIDE test strip [id number] No Use to check blood sugar up to twice a day as directed Jearld Fenton, NP Taking Active   Accu-Chek Softclix Lancets lancets [id number] No Use to check blood sugar up to twice a day as directed Jearld Fenton, NP Taking Active   albuterol (PROVENTIL) (2.5 MG/3ML) 0.083% nebulizer solution [id number] No Inhale 3 mLs (2.5 mg total) into the lungs every 4 (four) hours as needed for wheezing or shortness of breath.  Patient taking differently: Inhale 2.5 mg into the lungs every 6 (six) hours as needed for wheezing or shortness of breath.   Lorella Nimrod, MD Taking Active Self, Multiple Informants  albuterol (VENTOLIN HFA) 108 (90 Base) MCG/ACT inhaler [id number] No Inhale 2-4 puffs by mouth every 4 hours as needed for wheezing, cough, and/or shortness of breath  Patient taking differently: 2 puffs every 6 (six) hours as needed.   Malfi, Lupita Raider, FNP Taking Active Multiple Informants, Self  ALPRAZolam Duanne Moron) 1 MG tablet [id number] No Take 1 mg by mouth 3 (three) times daily as needed for anxiety or sleep. [provider] Taking Active Self  amLODipine (NORVASC) 5 MG tablet [id number] No Take 1 tablet (5 mg total) by mouth daily. Jearld Fenton, NP Taking Active   Asenapine Maleate 10 MG SUBL [id number] No Place 5 mg under  the tongue at bedtime. [provider] Taking Active Multiple Informants, Self  aspirin 81 MG EC tablet [id number] No Take 1 tablet (81 mg total) by mouth daily. Swallow whole. Jearld Fenton, NP Taking Active Self, Multiple Informants  bisoprolol (ZEBETA) 10 MG tablet [id number] No Take 1 tablet (10 mg total) by mouth daily. Jearld Fenton, NP Taking Active   buPROPion (WELLBUTRIN XL) 300 MG 24 hr tablet [id number] No Take 1 tablet (300 mg total) by mouth daily. Carrie Mew, MD Taking Active Multiple Informants, Self  busPIRone (BUSPAR) 15 MG tablet [id number] No Take 15 mg by mouth 2 (two) times daily. [provider] Taking Active Self  clopidogrel (PLAVIX) 75 MG tablet [id number] No Take 75 mg by mouth daily. [provider] Taking Active   docusate sodium (COLACE) 100 MG capsule [id number] No Take 100 mg by mouth daily. [provider] Taking Active Self, Multiple Informants  Dulaglutide 3 MG/0.5ML SOPN [id number] No Inject 0.5 mLs (3 mg total) subcutaneously once a week [provider] Taking Active Self, Multiple Informants  DULoxetine (CYMBALTA) 60 MG capsule [id number] No Take 60 mg by mouth daily. [provider] Taking Active   famotidine (PEPCID) 20 MG tablet [id number] No Take 20 mg by mouth 2 (two) times daily. [provider] Taking Active Multiple Informants, Self  FARXIGA 10 MG TABS tablet [id number] No Take 10 mg by mouth daily. [provider] Taking Active Multiple Informants, Self  fluticasone (FLONASE)  50 MCG/ACT nasal spray [id number] No Place 1 spray into both nostrils daily. Jearld Fenton, NP Taking Active   fluticasone-salmeterol (ADVAIR) 250-50 MCG/ACT AEPB [id number] No Inhale 1 puff into the lungs 2 (two) times daily. [provider] Taking Active Multiple Informants, Self  gabapentin (NEURONTIN) 600 MG tablet [id number] No Take 600-900 mg by mouth 3 (three) times daily. 600 mg AM, 600 mg pm and  900 mg at bedtime [provider] Taking Active Multiple Informants, Self  GLOBAL EASE INJECT PEN NEEDLES 32G X 4 MM MISC [id number] No Use as directed with insulin Kendell Bane, NP Taking Active Multiple Informants, Self  Insulin Glargine Solostar (LANTUS) 100 UNIT/ML Solostar Pen [id number] No Inject 11 Units into the skin at bedtime. [provider] Taking Active Self, Multiple Informants  Insulin Pen Needle (BD PEN NEEDLE NANO U/F) 32G X 4 MM MISC [id number] No Inject 1 Device into the skin daily. Jearld Fenton, NP Taking Active Multiple Informants, Self  Insulin Syringe-Needle U-100 (INSULIN SYRINGE 1CC/31GX5/16") 31G X 5/16" 1 ML MISC [id number] No 1 Syringe by Does not apply route in the morning, at noon, in the evening, and at bedtime. Jearld Fenton, NP Taking Active Multiple Informants, Self  lactulose (CHRONULAC) 10 GM/15ML solution [id number] No Take 30 g by mouth 2 (two) times daily. [provider] Taking Active   LINZESS 290 MCG CAPS capsule [id number] No Take 1 capsule (290 mcg total) by mouth daily before breakfast. Jearld Fenton, NP Taking Active Self, Multiple Informants  lurasidone (LATUDA) 40 MG TABS tablet [id number] No Take 40 mg by mouth daily. [provider] Taking Active Self  meloxicam (MOBIC) 15 MG tablet [id number] No Take 15 mg by mouth daily. [provider] Taking Active   metFORMIN (GLUCOPHAGE) 1000 MG tablet [id number] No Take 1,000 mg by mouth 2 (two) times daily with a meal. [provider] Taking Active Self, Multiple Informants  mirabegron ER (MYRBETRIQ) 50 MG TB24 tablet [id number] No Take 1 tablet (50 mg total) by mouth daily. Bjorn Loser, MD Taking Active Self, Multiple Informants  montelukast (SINGULAIR) 10 MG tablet [id number] No Take 1 tablet (10 mg total) by mouth daily. Carrie Mew, MD Taking Active Multiple Informants, Self  pantoprazole (PROTONIX) 40 MG tablet [id number] No Take 1 tablet (40 mg  total) by mouth 2 (two) times daily.  Patient taking differently: Take 40 mg by mouth daily.   Jearld Fenton, NP Taking Active   predniSONE (DELTASONE) 5 MG tablet [id number] No Take 5 mg by mouth daily. [provider] Taking Active   pregabalin (LYRICA) 75 MG capsule [id number] No Take 100 mg by mouth 3 (three) times daily. [provider] Taking Active Self, Multiple Informants  rosuvastatin (CRESTOR) 10 MG tablet [id number] No Take 1 tablet (10 mg total) by mouth daily. Jearld Fenton, NP Taking Active   TRAVATAN Z 0.004 % SOLN ophthalmic solution [id number] No Place 1 drop into both eyes at bedtime. Carrie Mew, MD Taking Active Multiple Informants, Self            Pertinent Labs:  Lab Results  Component Value Date   HGBA1C 7.1 (A) 06/30/2022   Lab Results  Component Value Date   CHOL 68 05/15/2022   HDL 32 (L) 05/15/2022   LDLCALC 24 05/15/2022   TRIG 59 05/15/2022   CHOLHDL 2.1 05/15/2022   Lab Results  Component Value Date   CREATININE 1.04 (H) 05/14/2022   BUN 12 05/14/2022   NA  143 05/14/2022   K 4.3 05/14/2022   CL 107 05/14/2022   CO2 27 05/14/2022   BP Readings from Last 3 Encounters:  11/05/22 128/78  06/30/22 133/73  05/15/22 129/81   Pulse Readings from Last 3 Encounters:  11/05/22 82  06/30/22 92  05/15/22 91    SDOH:  (Social Determinants of Health) assessments and interventions performed:  SDOH Interventions    Flowsheet Row Chronic Care Management from 12/11/2022 in [place] Coordination from 07/24/2022 in [place] from 06/22/2022 in [place] Management from 01/12/2022 in [facility] Clinical Support from 12/18/2021 in [facility] Office Visit from 07/09/2021 in [facility]  SDOH Interventions         Food Insecurity Interventions -- -- Intervention Not Indicated Intervention Not Indicated Intervention Not Indicated --  Housing Interventions -- -- Intervention Not Indicated Intervention Not Indicated Intervention Not Indicated --  Transportation Interventions -- -- Intervention Not Indicated Intervention Not Indicated Intervention Not Indicated --  Utilities Interventions -- Intervention Not Indicated -- -- -- --  Depression Interventions/Treatment  -- -- -- -- Currently on Treatment, Counseling Currently on Treatment  Financial Strain Interventions -- -- -- Intervention Not Indicated Intervention Not Indicated --  Physical Activity Interventions -- -- -- Other (Comments)  [has an exercise bike but has not been using it] -- --  Stress Interventions Other (Comment)  [the patient is stressed over her neck pain and discomfort and her son sometimes gets upset with her because she forgets and repeats herself a lot] -- -- Intervention Not Indicated -- --  Social Connections Interventions -- -- -- Other (Comment)  [has good support system. 4 children] Intervention Not Indicated --       CCM Care Plan  Review of patient past medical history, allergies, medications, health status, including review of consultants reports, laboratory and other test data, was performed as part of comprehensive evaluation and provision of chronic care management services.   Care Plan : PharmD - Medication Management/T2DM  Updates made by Rennis Petty, RPH-CPP since 12/14/2022 12:00 AM     Problem: Disease Progression      Long-Range Goal: Disease Progression Prevented or Minimized   Start Date: 01/19/2022  Expected End Date: 04/19/2022  Priority: High  Note:   Current Barriers:  Unable to achieve control of blood sugar  Chronic Disease Management support and education needs related to managing HTN, GERD, major depressive disorder, Type II DM, history of CVA, diabetic neuropathy, GAD, OSA, HLD,  constipation, obesity, COPD  Pharmacist Clinical Goal(s):  patient will achieve adherence to monitoring guidelines and medication adherence to achieve therapeutic efficacy achieve control of blood sugar as evidenced by A1C  through collaboration with PharmD and provider.    Interventions: 1:1 collaboration with Jearld Fenton, NP regarding development and update of comprehensive plan of care as evidenced by provider attestation and co-signature Inter-disciplinary care team collaboration (see longitudinal plan of care) Patient again declines request to review medications with Clinical Pharmacist today Encourage patient to contact Pulmonologist as due for follow up appointment Agrees will call to follow up with Pulmonology office to schedule  Medication Adherence: Patient uses weekly pillbox to organize her medications/as adherence aid Have encouraged patient to also use latest medication list as aid when filling weekly pillbox to ensure all current medications are included  T2DM:  Patient followed by [facility] Endocrinology Uncontrolled; current treatment: Metformin 1000 mg twice daily Trulicity 3 mg weekly on Fridays Lantus 20 units QHS  Reports increased based on her home readings following direction from Endocrinologist Farxiga 10 mg daily Denies keeping log of blood sugar results, but recalls morning fasting blood sugar this morning ~160 Reports readings recently higher after steroid injection received from Dr. Alba Destine, but readings now improved Encourage patient to have regular well-balanced meals spread throughout the day, while limiting carbohydrate portion sizes Reports currently unable to receive a new Freestyle Libre 2 Continuous Glucose Monitor (CGM) reader through her [employer] coverage as she is having a dispute with previous DME company who denies having received the first [employer] 2 reader that she shipped (patient felt it was malfunctioning) Reports son  attempted to setup her smart phone to be used in place of reader device, but that her phone was not compatible  States monitoring 3-4 times daily with glucometer for now Discuss possible option of Freestyle [place] 3 reader and sensors as alternative to try through her [employer] coverage at local pharmacy Patient states she will call Endocrinology today to see if provider will send these prescriptions to [place] for her Statin: rosuvastatin 10 mg daily Encourage patient to monitor home blood as directed by Endocrinologist and to follow up with clinic for readings outside of established parameters or new symptoms   Patient Goals/Self-Care Activities patient will:  - take medications as prescribed as evidenced by patient report and record review - check glucose, document, and provide at future appointments - check blood pressure, document, and provide at future appointments       Plan: Telephone follow up appointment with care management team member scheduled for:  03/15/2023 at 1:00 PM  Wallace Cullens, PharmD, [place], [place] [phone]

## 2022-12-16 DIAGNOSIS — F3189 Other bipolar disorder: Secondary | ICD-10-CM | POA: Diagnosis not present

## 2022-12-16 DIAGNOSIS — F4310 Post-traumatic stress disorder, unspecified: Secondary | ICD-10-CM | POA: Diagnosis not present

## 2022-12-21 ENCOUNTER — Other Ambulatory Visit: Payer: Self-pay | Admitting: Internal Medicine

## 2022-12-22 NOTE — Telephone Encounter (Signed)
Unable to refill per protocol, Rx request is too soon. Last refill 09/29/22 for 16 g and 2 refills.  Requested Prescriptions  Pending Prescriptions Disp Refills   fluticasone (FLONASE) 50 MCG/ACT nasal spray [Pharmacy Med Name: FLUTICASONE PROP 50 MCG SPRAY] 16 g 0    Sig: Place 1 spray into both nostrils daily.     Ear, Nose, and Throat: Nasal Preparations - Corticosteroids Passed - 12/21/2022  8:18 AM      Passed - Valid encounter within last 12 months    Recent Outpatient Visits           5 months ago Uncontrolled type 2 diabetes mellitus with hyperglycemia [facility])   [facility] [place], Coralie Keens, NP   10 months ago Encounter for general adult medical examination with abnormal findings   [facility] [place], Coralie Keens, NP   11 months ago COPD exacerbation [facility])   [facility] [place], Coralie Keens, NP   1 year ago Mixed diabetic hyperlipidemia associated with type 2 diabetes mellitus [facility])   [facility] [place], [place] W, NP   2 years ago Uncontrolled type 2 diabetes mellitus with hyperglycemia [facility])   [facility] Olin Hauser, DO       Future Appointments             In 1 week Baity, Coralie Keens, NP [facility], [place]   In 1 month MacDiarmid, Nicki Reaper, [place]

## 2022-12-23 ENCOUNTER — Other Ambulatory Visit: Payer: Self-pay | Admitting: Internal Medicine

## 2022-12-23 DIAGNOSIS — E1165 Type 2 diabetes mellitus with hyperglycemia: Secondary | ICD-10-CM

## 2022-12-23 NOTE — Telephone Encounter (Signed)
Requested Prescriptions  Pending Prescriptions Disp Refills   fluticasone (FLONASE) 50 MCG/ACT nasal spray [Pharmacy Med Name: FLUTICASONE PROP 50 MCG SPRAY] 16 g 0    Sig: Place 1 spray into both nostrils daily.     Ear, Nose, and Throat: Nasal Preparations - Corticosteroids Passed - 12/23/2022 11:03 AM      Passed - Valid encounter within last 12 months    Recent Outpatient Visits           5 months ago Uncontrolled type 2 diabetes mellitus with hyperglycemia [facility])   [facility] [place], Coralie Keens, NP   10 months ago Encounter for general adult medical examination with abnormal findings   [facility] [place], Coralie Keens, NP   11 months ago COPD exacerbation [facility])   [facility] [place], Coralie Keens, NP   1 year ago Mixed diabetic hyperlipidemia associated with type 2 diabetes mellitus [facility])   [facility] [place], [place] W, NP   2 years ago Uncontrolled type 2 diabetes mellitus with hyperglycemia [facility])   [place], Alexander J, DO       Future Appointments             In 1 week Garnette Gunner, Coralie Keens, NP [facility], [place]   In 1 month MacDiarmid, Nicki Reaper, [facility] [place]             rosuvastatin (CRESTOR) 10 MG tablet [Pharmacy Med Name: ROSUVASTATIN CALCIUM 10 MG TAB] 90 tablet 0    Sig: Take 1 tablet (10 mg total) by mouth daily.     Cardiovascular:  Antilipid - Statins 2 Failed - 12/23/2022 11:03 AM      Failed - Cr in normal range and within 360 days    Creat  Date Value Ref Range Status  02/10/2022 0.94 0.50 - 1.05 mg/dL Final   Creatinine, Ser  Date Value Ref Range Status  05/14/2022 1.04 (H) 0.44 - 1.00 mg/dL Final   Creatinine, Urine  Date Value Ref Range Status  02/10/2022 43 20 - 275 mg/dL Final         Failed - Lipid Panel in normal range within the last 12 months    Cholesterol   Date Value Ref Range Status  05/15/2022 68 0 - 200 mg/dL Final   LDL Cholesterol (Calc)  Date Value Ref Range Status  02/10/2022 26 mg/dL (calc) Final    Comment:    Reference range: <100 . Desirable range <100 mg/dL for primary prevention;   <70 mg/dL for patients with CHD or diabetic patients  with > or = 2 CHD risk factors. Marland Kitchen LDL-C is now calculated using the Martin-Hopkins  calculation, which is a validated novel method providing  better accuracy than the Friedewald equation in the  estimation of LDL-C.  Cresenciano Genre et al. Annamaria Helling. [id number]): 2061-2068  (http://education.[employer].com/faq/FAQ164)    LDL Cholesterol  Date Value Ref Range Status  05/15/2022 24 0 - 99 mg/dL Final    Comment:           Total Cholesterol/HDL:CHD Risk Coronary Heart Disease Risk Table                     Men   Women  1/2 Average Risk   3.4   3.3  Average Risk       5.0   4.4  2  X Average Risk   9.6   7.1  3 X Average Risk  23.4   11.0        Use the calculated Patient Ratio above and the CHD Risk Table to determine the patient's CHD Risk.        ATP III CLASSIFICATION (LDL):  <100     mg/dL   Optimal  100-129  mg/dL   Near or Above                    Optimal  130-159  mg/dL   Borderline  160-189  mg/dL   High  >190     mg/dL   Very High Performed at [facility], [place], [place] [address]    HDL  Date Value Ref Range Status  05/15/2022 32 (L) >40 mg/dL Final   Triglycerides  Date Value Ref Range Status  05/15/2022 59 <150 mg/dL Final         Passed - Patient is not pregnant      Passed - Valid encounter within last 12 months    Recent Outpatient Visits           5 months ago Uncontrolled type 2 diabetes mellitus with hyperglycemia [facility])   [facility] [place], Coralie Keens, NP   10 months ago Encounter for general adult medical examination with abnormal findings   [facility] [place],  Coralie Keens, NP   11 months ago COPD exacerbation [facility])   [facility] [place], Coralie Keens, NP   1 year ago Mixed diabetic hyperlipidemia associated with type 2 diabetes mellitus [facility])   [facility] [place], [place] W, NP   2 years ago Uncontrolled type 2 diabetes mellitus with hyperglycemia [facility])   [facility] [place], Devonne Doughty, DO       Future Appointments             In 1 week Baity, Coralie Keens, NP [facility], [place]   In 1 month MacDiarmid, Nicki Reaper, [place]

## 2022-12-24 ENCOUNTER — Other Ambulatory Visit: Payer: Self-pay | Admitting: Internal Medicine

## 2022-12-24 NOTE — Telephone Encounter (Signed)
Requested Prescriptions  Pending Prescriptions Disp Refills   pantoprazole (PROTONIX) 40 MG tablet [Pharmacy Med Name: PANTOPRAZOLE SOD DR 40 MG TAB] 180 tablet 1    Sig: Take 1 tablet (40 mg total) by mouth 2 (two) times daily.     Gastroenterology: Proton Pump Inhibitors Passed - 12/24/2022  7:58 AM      Passed - Valid encounter within last 12 months    Recent Outpatient Visits           5 months ago Uncontrolled type 2 diabetes mellitus with hyperglycemia [facility])   [facility] [place], Coralie Keens, NP   10 months ago Encounter for general adult medical examination with abnormal findings   [facility] [place], Coralie Keens, NP   11 months ago COPD exacerbation [facility])   [facility] [place], Coralie Keens, NP   1 year ago Mixed diabetic hyperlipidemia associated with type 2 diabetes mellitus [facility])   [facility] [place], [place] W, NP   2 years ago Uncontrolled type 2 diabetes mellitus with hyperglycemia [facility])   [facility] Olin Hauser, DO       Future Appointments             In 1 week Baity, Coralie Keens, NP [facility], [place]   In 1 month MacDiarmid, Nicki Reaper, [place]

## 2022-12-25 ENCOUNTER — Ambulatory Visit (INDEPENDENT_AMBULATORY_CARE_PROVIDER_SITE_OTHER): Payer: Medicare HMO

## 2022-12-25 VITALS — Ht 64.0 in | Wt 211.0 lb

## 2022-12-25 DIAGNOSIS — Z0000 Encounter for general adult medical examination without abnormal findings: Secondary | ICD-10-CM

## 2022-12-25 NOTE — Patient Instructions (Signed)
Rachel Nielsen , Thank you for taking time to come for your Medicare Wellness Visit. I appreciate your ongoing commitment to your health goals. Please review the following plan we discussed and let me know if I can assist you in the future.   These are the goals we discussed:  Goals      CCM Expected Outcome:  Monitor, Self-Manage and Reduce Symptoms of Diabetes     Current Barriers:  Knowledge Deficits related to hypoglycemia and how to effectively manage low blood sugars Chronic Disease Management support and education needs related to effective management of DM  Lab Results  Component Value Date   HGBA1C 7.1 (A) 06/30/2022    Planned Interventions: Provided education to patient about basic DM disease process; Reviewed medications with patient and discussed importance of medication adherence. The patient is compliant with medications. She wants to discuss with her endocrinologist about adding meal coverage back because her blood sugars are bouncing up and down. Education and review of dietary habits and insulin coverage;        Reviewed prescribed diet with patient heart healthy/ADA diet. Review of heart healthy/ADA diet and the patient states she is monitoring what she eats but sometimes her blood sugars are still up and down. She has multiple chronic conditions that she feels impacts her health and well being; Counseled on importance of regular laboratory monitoring as prescribed. Has regular lab work drawn. Education and support given;        Discussed plans with patient for ongoing care management follow up and provided patient with direct contact information for care management team;      Provided patient with written educational materials related to hypo and hyperglycemia and importance of correct treatment. Education given and discussed keeping glucose tablets and snacks around in the event of low blood sugars;       Reviewed scheduled/upcoming provider appointments including: 12-31-2022 at  11 am;         Advised patient, providing education and rationale, to check cbg three times daily and when you have symptoms of low or high blood sugar and record. Is checking more frequently since she had the hypoglycemic event at the end of November. Has seen the endocrinologist and has been given parameters to call and when to take insulin. States her blood sugars are still up and down. She continues to see the endocrinologist and will follow up soon for recommendations on meal coverage insulin     call provider for findings outside established parameters. The patient has parameters to go by and knows when to call the endocrinologist for changes.;       Referral made to pharmacy team for assistance with ongoing support and education for medication and management of medications effectively. Works with the [facility] on a regular basis for effective management of DM;       Review of patient status, including review of consultants reports, relevant laboratory and other test results, and medications completed;       Advised patient to discuss changes in DM, questions or concerns with provider;      Screening for signs and symptoms of depression related to chronic disease state;        Assessed social determinant of health barriers;    Saw the eye doctor recently and her pressure was up and her tear ducts were blocked. She has been back a couple of times for evaluation and treatment.       Symptom Management: Take medications as prescribed  Attend all scheduled provider appointments Call provider office for new concerns or questions  call the Suicide and Crisis Lifeline: 988 call the [place] National Suicide Prevention Lifeline: (701) 077-4622 or TTY: (956) 265-2796 TTY 684-014-0522) to talk to a trained counselor call 1-800-273-TALK (toll free, 24 hour hotline) if experiencing a Mental Health or [place]  check feet daily for cuts, sores or redness trim toenails straight across manage  portion size wash and dry feet carefully every day wear comfortable, cotton socks wear comfortable, well-fitting shoes  Follow Up Plan: Telephone follow up appointment with care management team member scheduled for: 02-12-2023 at 1 pm       CCM Expected Outcome:  Monitor, Self-Manage, and Reduce Symptoms of Hypertension     Current Barriers:  Knowledge Deficits related to normal blood pressure readings and the need to maintain control of blood pressure to prevent any adverse effects or increase risk of heart attack and stroke Chronic Disease Management support and education needs related to effective management of HTN BP Readings from Last 3 Encounters:  11/05/22 128/78  06/30/22 133/73  05/15/22 129/81     Planned Interventions: Evaluation of current treatment plan related to hypertension self management and patient's adherence to plan as established by provider. The patients blood pressures are stable. The patient states she is still having a lot of pain and discomfort in her neck and back. She was able to talk to the [facility] today and vent about her health and well being and things concerning her son. Recommended she talk to her psychiatrist for more assistance. She is concerned about her overall conditions and how she is feeling. Reflective listening and support given;   Provided education to patient re: stroke prevention, s/s of heart attack and stroke; Reviewed prescribed diet heart healthy/ADA diet. Education provided. The patient tries to adhere to a heart healthy/ADA diet Reviewed medications with patient and discussed importance of compliance. The patient verbalized compliance with medications. Denies any medications concerns today. Works with [facility] on a regular basis. Has an upcoming appointment with the pharm D. Reminder provided today;  Discussed plans with patient for ongoing care management follow up and provided patient with direct contact information for care management  team; Advised patient, providing education and rationale, to monitor blood pressure daily and record, calling PCP for findings outside established parameters;  Reviewed scheduled/upcoming provider appointments including: 12-31-2022 at 11 am with the pcp. Encouraged the patient to discuss her concerns with the provider at her upcoming appointment Advised patient to discuss changes in her HTN or heart health with provider; Provided education on prescribed diet heart healthy/ADA. Review and education provided;  Discussed complications of poorly controlled blood pressure such as heart disease, stroke, circulatory complications, vision complications, kidney impairment, sexual dysfunction;  Screening for signs and symptoms of depression related to chronic disease state;  Assessed social determinant of health barriers;  The patient has noticed changes in her hearing and feels she needs to have a hearing test in the new year.   Symptom Management: Take medications as prescribed   Attend all scheduled provider appointments Call provider office for new concerns or questions  call the Suicide and Crisis Lifeline: 988 call the [place] National Suicide Prevention Lifeline: 3864307023 or TTY: 609-786-9821 TTY 520-658-8311) to talk to a trained counselor call 1-800-273-TALK (toll free, 24 hour hotline) if experiencing a Mental Health or [place]  check blood pressure weekly learn about high blood pressure take blood pressure log to all doctor appointments call  doctor for signs and symptoms of high blood pressure develop an action plan for high blood pressure keep all doctor appointments take medications for blood pressure exactly as prescribed report new symptoms to your doctor  Follow Up Plan: Telephone follow up appointment with care management team member scheduled for:02-19-2023 at 1 pm       [place]     Patient Stated     12/31/2020, want to get health  issues under control     Pharmacy Goals     Our goal A1c is less than 7%. This corresponds with fasting sugars less than 130 and 2 hour after meal sugars less than 180. Please check your blood sugar and keep a log of the results  Our goal bad cholesterol, or LDL, is less than 70 . This is why it is important to continue taking your rosuvastatin.  Feel free to call me with any questions or concerns. I look forward to our next call!   Wallace Cullens, PharmD, [place] [phone]        This is a list of the screening recommended for you and due dates:  Health Maintenance  Topic Date Due   COVID-19 Vaccine (1) Never done   DTaP/Tdap/Td vaccine (1 - Tdap) Never done   Zoster (Shingles) Vaccine (1 of 2) Never done   Screening for Lung Cancer  Never done   Mammogram  04/16/2022   Flu Shot  01/31/2023*   Hemoglobin A1C  12/31/2022   Complete foot exam   01/07/2023   Yearly kidney health urinalysis for diabetes  02/11/2023   Yearly kidney function blood test for diabetes  05/15/2023   Eye exam for diabetics  08/06/2023   Medicare Annual Wellness Visit  12/26/2023   Colon Cancer Screening  07/20/2028   Pneumonia Vaccine  Completed   DEXA scan (bone density measurement)  Completed   Hepatitis C Screening: USPSTF Recommendation to screen - Ages 18-79 yo.  Completed   HPV Vaccine  Aged Out  *Topic was postponed. The date shown is not the original due date.    Advanced directives: no  Conditions/risks identified: none  Next appointment: Follow up in one year for your annual wellness visit 12/31/23 @ 9:30 am by phone   Preventive Care 65 Years and Older, Female Preventive care refers to lifestyle choices and visits with your health care provider that can promote health and wellness. What does preventive care include? A yearly physical exam. This is also called an annual well check. Dental exams once or twice a  year. Routine eye exams. Ask your health care provider how often you should have your eyes checked. Personal lifestyle choices, including: Daily care of your teeth and gums. Regular physical activity. Eating a healthy diet. Avoiding tobacco and drug use. Limiting alcohol use. Practicing safe sex. Taking low-dose aspirin every day. Taking vitamin and mineral supplements as recommended by your health care provider. What happens during an annual well check? The services and screenings done by your health care provider during your annual well check will depend on your age, overall health, lifestyle risk factors, and family history of disease. Counseling  Your health care provider may ask you questions about your: Alcohol use. Tobacco use. Drug use. Emotional well-being. Home and relationship well-being. Sexual activity. Eating habits. History of falls. Memory and ability to understand (cognition). Work and work Statistician. Reproductive health. Screening  You may have the following tests  or measurements: Height, weight, and BMI. Blood pressure. Lipid and cholesterol levels. These may be checked every 5 years, or more frequently if you are over 36 years old. Skin check. Lung cancer screening. You may have this screening every year starting at age 71 if you have a 30-pack-year history of smoking and currently smoke or have quit within the past 15 years. Fecal occult blood test (FOBT) of the stool. You may have this test every year starting at age 55. Flexible sigmoidoscopy or colonoscopy. You may have a sigmoidoscopy every 5 years or a colonoscopy every 10 years starting at age 43. Hepatitis C blood test. Hepatitis B blood test. Sexually transmitted disease (STD) testing. Diabetes screening. This is done by checking your blood sugar (glucose) after you have not eaten for a while (fasting). You may have this done every 1-3 years. Bone density scan. This is done to screen for  osteoporosis. You may have this done starting at age 47. Mammogram. This may be done every 1-2 years. Talk to your health care provider about how often you should have regular mammograms. Talk with your health care provider about your test results, treatment options, and if necessary, the need for more tests. Vaccines  Your health care provider may recommend certain vaccines, such as: Influenza vaccine. This is recommended every year. Tetanus, diphtheria, and acellular pertussis (Tdap, Td) vaccine. You may need a Td booster every 10 years. Zoster vaccine. You may need this after age 13. Pneumococcal 13-valent conjugate (PCV13) vaccine. One dose is recommended after age 18. Pneumococcal polysaccharide (PPSV23) vaccine. One dose is recommended after age 67. Talk to your health care provider about which screenings and vaccines you need and how often you need them. This information is not intended to replace advice given to you by your health care provider. Make sure you discuss any questions you have with your health care provider. Document Released: 11/15/2015 Document Revised: 07/08/2016 Document Reviewed: 08/20/2015 Elsevier Interactive Patient Education  2017 [place] Prevention in the Home Falls can cause injuries. They can happen to people of all ages. There are many things you can do to make your home safe and to help prevent falls. What can I do on the outside of my home? Regularly fix the edges of walkways and driveways and fix any cracks. Remove anything that might make you trip as you walk through a door, such as a raised step or threshold. Trim any bushes or trees on the path to your home. Use bright outdoor lighting. Clear any walking paths of anything that might make someone trip, such as rocks or tools. Regularly check to see if handrails are loose or broken. Make sure that both sides of any steps have handrails. Any raised decks and porches should have guardrails on  the edges. Have any leaves, snow, or ice cleared regularly. Use sand or salt on walking paths during winter. Clean up any spills in your garage right away. This includes oil or grease spills. What can I do in the bathroom? Use night lights. Install grab bars by the toilet and in the tub and shower. Do not use towel bars as grab bars. Use non-skid mats or decals in the tub or shower. If you need to sit down in the shower, use a plastic, non-slip stool. Keep the floor dry. Clean up any water that spills on the floor as soon as it happens. Remove soap buildup in the tub or shower regularly. Attach bath mats securely with double-sided  non-slip rug tape. Do not have throw rugs and other things on the floor that can make you trip. What can I do in the bedroom? Use night lights. Make sure that you have a light by your bed that is easy to reach. Do not use any sheets or blankets that are too big for your bed. They should not hang down onto the floor. Have a firm chair that has side arms. You can use this for support while you get dressed. Do not have throw rugs and other things on the floor that can make you trip. What can I do in the kitchen? Clean up any spills right away. Avoid walking on wet floors. Keep items that you use a lot in easy-to-reach places. If you need to reach something above you, use a strong step stool that has a grab bar. Keep electrical cords out of the way. Do not use floor polish or wax that makes floors slippery. If you must use wax, use non-skid floor wax. Do not have throw rugs and other things on the floor that can make you trip. What can I do with my stairs? Do not leave any items on the stairs. Make sure that there are handrails on both sides of the stairs and use them. Fix handrails that are broken or loose. Make sure that handrails are as long as the stairways. Check any carpeting to make sure that it is firmly attached to the stairs. Fix any carpet that is loose  or worn. Avoid having throw rugs at the top or bottom of the stairs. If you do have throw rugs, attach them to the floor with carpet tape. Make sure that you have a light switch at the top of the stairs and the bottom of the stairs. If you do not have them, ask someone to add them for you. What else can I do to help prevent falls? Wear shoes that: Do not have high heels. Have rubber bottoms. Are comfortable and fit you well. Are closed at the toe. Do not wear sandals. If you use a stepladder: Make sure that it is fully opened. Do not climb a closed stepladder. Make sure that both sides of the stepladder are locked into place. Ask someone to hold it for you, if possible. Clearly mark and make sure that you can see: Any grab bars or handrails. First and last steps. Where the edge of each step is. Use tools that help you move around (mobility aids) if they are needed. These include: Canes. Walkers. Scooters. Crutches. Turn on the lights when you go into a dark area. Replace any light bulbs as soon as they burn out. Set up your furniture so you have a clear path. Avoid moving your furniture around. If any of your floors are uneven, fix them. If there are any pets around you, be aware of where they are. Review your medicines with your doctor. Some medicines can make you feel dizzy. This can increase your chance of falling. Ask your doctor what other things that you can do to help prevent falls. This information is not intended to replace advice given to you by your health care provider. Make sure you discuss any questions you have with your health care provider. Document Released: 08/15/2009 Document Revised: 03/26/2016 Document Reviewed: 11/23/2014 Elsevier Interactive Patient Education  2017 [employer].

## 2022-12-25 NOTE — Progress Notes (Signed)
I connected with  [name] on 12/25/22 by a audio enabled telemedicine application and verified that I am speaking with the correct person using two identifiers.  Patient Location: Home  Provider Location: Office/Clinic  I discussed the limitations of evaluation and management by telemedicine. The patient expressed understanding and agreed to proceed.  Subjective:   Rachel Nielsen is a 72 y.o. female who presents for Medicare Annual (Subsequent) preventive examination.  Review of Systems     Cardiac Risk Factors include: advanced age (>64mn, >>57women);diabetes mellitus;hypertension;obesity (BMI >30kg/m2)     Objective:    Today's Vitals   12/25/22 0957  PainSc: 4    There is no height or weight on file to calculate BMI.     12/25/2022   10:06 AM 05/14/2022    7:18 AM 05/13/2022    8:52 AM 03/02/2022    2:19 PM 12/21/2021    8:26 AM 12/31/2020    8:29 AM 10/08/2020    3:00 PM  Advanced Directives  Does Patient Have a Medical Advance Directive? No  No No No No No  Would patient like information on creating a medical advance directive? No - Patient declined No - Patient declined  No - Patient declined   No - Patient declined    Current Medications (verified) Outpatient Encounter Medications as of 12/25/2022  Medication Sig   ACCU-CHEK GUIDE test strip Use to check blood sugar up to twice a day as directed   Accu-Chek Softclix Lancets lancets Use to check blood sugar up to twice a day as directed   albuterol (VENTOLIN HFA) 108 (90 Base) MCG/ACT inhaler Inhale 2-4 puffs by mouth every 4 hours as needed for wheezing, cough, and/or shortness of breath (Patient taking differently: 2 puffs every 6 (six) hours as needed.)   ALPRAZolam (XANAX) 1 MG tablet Take 1 mg by mouth 3 (three) times daily as needed for anxiety or sleep.   amLODipine (NORVASC) 5 MG tablet Take 1 tablet (5 mg total) by mouth daily.   Asenapine Maleate 10 MG SUBL Place 5 mg under the tongue at bedtime.   aspirin  81 MG EC tablet Take 1 tablet (81 mg total) by mouth daily. Swallow whole.   bisoprolol (ZEBETA) 10 MG tablet Take 1 tablet (10 mg total) by mouth daily.   buPROPion (WELLBUTRIN XL) 300 MG 24 hr tablet Take 1 tablet (300 mg total) by mouth daily.   busPIRone (BUSPAR) 15 MG tablet Take 15 mg by mouth 2 (two) times daily.   clopidogrel (PLAVIX) 75 MG tablet Take 75 mg by mouth daily.   docusate sodium (COLACE) 100 MG capsule Take 100 mg by mouth daily.   Dulaglutide 3 MG/0.5ML SOPN Inject 0.5 mLs (3 mg total) subcutaneously once a week   DULoxetine (CYMBALTA) 60 MG capsule Take 60 mg by mouth daily.   famotidine (PEPCID) 20 MG tablet Take 20 mg by mouth 2 (two) times daily.   FARXIGA 10 MG TABS tablet Take 10 mg by mouth daily.   fluticasone (FLONASE) 50 MCG/ACT nasal spray Place 1 spray into both nostrils daily.   fluticasone-salmeterol (ADVAIR) 250-50 MCG/ACT AEPB Inhale 1 puff into the lungs 2 (two) times daily.   gabapentin (NEURONTIN) 600 MG tablet Take 600-900 mg by mouth 3 (three) times daily. 600 mg AM, 600 mg pm and 900 mg at bedtime   GLOBAL EASE INJECT PEN NEEDLES 32G X 4 MM MISC Use as directed with insulin   Insulin Glargine Solostar (LANTUS) 100 UNIT/ML  Solostar Pen Inject 20 Units into the skin at bedtime.   Insulin Pen Needle (BD PEN NEEDLE NANO U/F) 32G X 4 MM MISC Inject 1 Device into the skin daily.   Insulin Syringe-Needle U-100 (INSULIN SYRINGE 1CC/31GX5/16") 31G X 5/16" 1 ML MISC 1 Syringe by Does not apply route in the morning, at noon, in the evening, and at bedtime.   lactulose (CHRONULAC) 10 GM/15ML solution Take 30 g by mouth 2 (two) times daily.   LINZESS 290 MCG CAPS capsule Take 1 capsule (290 mcg total) by mouth daily before breakfast.   lurasidone (LATUDA) 40 MG TABS tablet Take 40 mg by mouth daily.   metFORMIN (GLUCOPHAGE) 1000 MG tablet Take 1,000 mg by mouth 2 (two) times daily with a meal.   mirabegron ER (MYRBETRIQ) 50 MG TB24 tablet Take 1 tablet (50 mg  total) by mouth daily.   montelukast (SINGULAIR) 10 MG tablet Take 1 tablet (10 mg total) by mouth daily.   pantoprazole (PROTONIX) 40 MG tablet Take 1 tablet (40 mg total) by mouth 2 (two) times daily.   pregabalin (LYRICA) 75 MG capsule Take 100 mg by mouth 3 (three) times daily.   rosuvastatin (CRESTOR) 10 MG tablet Take 1 tablet (10 mg total) by mouth daily.   TRAVATAN Z 0.004 % SOLN ophthalmic solution Place 1 drop into both eyes at bedtime.   albuterol (PROVENTIL) (2.5 MG/3ML) 0.083% nebulizer solution Inhale 3 mLs (2.5 mg total) into the lungs every 4 (four) hours as needed for wheezing or shortness of breath. (Patient taking differently: Inhale 2.5 mg into the lungs every 6 (six) hours as needed for wheezing or shortness of breath.)   meloxicam (MOBIC) 15 MG tablet Take 15 mg by mouth daily. (Patient not taking: Reported on 12/25/2022)   predniSONE (DELTASONE) 5 MG tablet Take 5 mg by mouth daily. (Patient not taking: Reported on 12/25/2022)   No facility-administered encounter medications on file as of 12/25/2022.    Allergies (verified) Ace inhibitors   History: Past Medical History:  Diagnosis Date   Allergy    Anxiety    Asthma    Colon polyps    Constipation    COPD (chronic obstructive pulmonary disease) (HCC)    Depression    GERD (gastroesophageal reflux disease)    Glaucoma    Heart attack ([place])    Hypertension    Sleep apnea    Sleep apnea    Stroke [facility])    Urinary incontinence    Past Surgical History:  Procedure Laterality Date   APPENDECTOMY     CHOLECYSTECTOMY     COLONOSCOPY WITH PROPOFOL N/A 07/20/2018   Procedure: COLONOSCOPY WITH PROPOFOL;  Surgeon: Toledo, Benay Pike, MD;  Location: [facility] ENDOSCOPY;  Service: Gastroenterology;  Laterality: N/A;   ESOPHAGOGASTRODUODENOSCOPY N/A 07/20/2018   Procedure: ESOPHAGOGASTRODUODENOSCOPY (EGD);  Surgeon: Toledo, Benay Pike, MD;  Location: [facility] ENDOSCOPY;  Service: Gastroenterology;  Laterality: N/A;   GASTRECTOMY      TUBAL LIGATION     VAGINAL HYSTERECTOMY     Family History  Problem Relation Age of Onset   Hypertension Mother    Atrial fibrillation Mother    Heart attack Mother    Hypertension Father    Stomach cancer Father    Diabetes Father    Liver cancer Father    Diabetes Sister    Hypertension Sister    Social History   Socioeconomic History   Marital status: Legally Separated    Spouse name: Not on file  Number of children: 4   Years of education: Not on file   Highest education level: Not on file  Occupational History   Occupation: retired  Tobacco Use   Smoking status: Former   Smokeless tobacco: Never   Tobacco comments:    Uses vape w/ nicotine  Vaping Use   Vaping Use: Every day   Substances: Nicotine, Flavoring  Substance and Sexual Activity   Alcohol use: Not Currently    Comment: monthly or less    Drug use: No   Sexual activity: Not on file  Other Topics Concern   Not on file  Social History Narrative   Not on file   Social Determinants of Health   Financial Resource Strain: Medium Risk (12/25/2022)   Overall Financial Resource Strain (CARDIA)    Difficulty of Paying Living Expenses: Somewhat hard  Food Insecurity: No Food Insecurity (12/25/2022)   Hunger Vital Sign    Worried About Running Out of Food in the Last Year: Never true    Ran Out of Food in the Last Year: Never true  Transportation Needs: No Transportation Needs (12/25/2022)   PRAPARE - Hydrologist (Medical): No    Lack of Transportation (Non-Medical): No  Physical Activity: Inactive (12/25/2022)   Exercise Vital Sign    Days of Exercise per Week: 0 days    Minutes of Exercise per Session: 0 min  Stress: No Stress Concern Present (12/25/2022)   [place]    Feeling of Stress : Only a little  Social Connections: Socially Isolated (12/25/2022)   Social Connection and Isolation Panel [NHANES]     Frequency of Communication with Friends and Family: More than three times a week    Frequency of Social Gatherings with Friends and Family: More than three times a week    Attends Religious Services: Never    Marine scientist or Organizations: No    Attends Music therapist: Never    Marital Status: Separated    Tobacco Counseling Counseling given: Not Answered Tobacco comments: Uses vape w/ nicotine   Clinical Intake:  Pre-visit preparation completed: Yes  Pain : 0-10 Pain Score: 4  Pain Type: Chronic pain Pain Location: Neck     Nutritional Risks: None Diabetes: Yes CBG done?: No Did pt. bring in CBG monitor from home?: No  How often do you need to have someone help you when you read instructions, pamphlets, or other written materials from your doctor or pharmacy?: 1 - Never  Diabetic?yes Nutrition Risk Assessment:  Has the patient had any N/V/D within the last 2 months?  Yes  Does the patient have any non-healing wounds?  No  Has the patient had any unintentional weight loss or weight gain?  No   Diabetes:  Is the patient diabetic?  Yes  If diabetic, was a CBG obtained today?  No  Did the patient bring in their glucometer from home?  No  How often do you monitor your CBG's? 2-3x/day.   Financial Strains and Diabetes Management:  Are you having any financial strains with the device, your supplies or your medication? No .  Does the patient want to be seen by Chronic Care Management for management of their diabetes?  No  Would the patient like to be referred to a Nutritionist or for Diabetic Management?  No   Diabetic Exams:  Diabetic Eye Exam: Completed 08/05/22. Pt has been advised about the importance  in completing this exam.  Diabetic Foot Exam: Completed 01/06/22. Pt has been advised about the importance in completing this exam.   Interpreter Needed?: No  Information entered by :: Kirke Shaggy, LPN   Activities of Daily Living     12/25/2022   10:08 AM 05/13/2022    8:56 AM  In your present state of health, do you have any difficulty performing the following activities:  Hearing? 0   Vision? 0   Difficulty concentrating or making decisions? 1   Walking or climbing stairs? 1   Dressing or bathing? 0   Doing errands, shopping? 1 0  Preparing Food and eating ? Y   Using the Toilet? N   In the past six months, have you accidently leaked urine? N   Do you have problems with loss of bowel control? N   Managing your Medications? N   Managing your Finances? N   Housekeeping or managing your Housekeeping? Y     Patient Care Team: Jearld Fenton, NP as PCP - General (Internal Medicine) Curley Spice, Virl Diamond, RPH-CPP (Pharmacist) Vanita Ingles, RN as Registered Nurse (General Practice)  Indicate any recent Medical Services you may have received from other than [facility] providers in the past year (date may be approximate).     Assessment:   This is a routine wellness examination for Braxtyn.  Hearing/Vision screen Hearing Screening - Comments:: No aids Vision Screening - Comments:: Wears eyeglasses-   Dietary issues and exercise activities discussed: Current Exercise Habits: The patient does not participate in regular exercise at present   Goals Addressed             This Visit's Progress    DIET - EAT MORE FRUITS AND VEGETABLES         Depression Screen    12/25/2022   10:04 AM 02/10/2022   10:53 AM 01/06/2022   10:08 AM 12/18/2021    2:09 PM 07/09/2021    4:01 PM 12/31/2020    8:34 AM 12/17/2020    2:01 PM  PHQ 2/9 Scores  PHQ - 2 Score '1 6 6 6 2 6 6  '$ PHQ- 9 Score '3 24 20 22 5 20 20    '$ Fall Risk    12/25/2022   10:07 AM 10/16/2022    1:19 PM 02/10/2022   10:43 AM 01/06/2022   10:08 AM 12/18/2021    2:07 PM  Fall Risk   Falls in the past year? '1 1 1 1 1  '$ Number falls in past yr: '1 1 1 1 '$ 0  Injury with Fall? 0 0 0 0 0  Risk for fall due to : History of fall(s) History of fall(s);Impaired  balance/gait;Impaired mobility;Impaired vision;Orthopedic patient History of fall(s) History of fall(s) No Fall Risks  Follow up Falls prevention discussed;Falls evaluation completed Falls evaluation completed;Education provided;Falls prevention discussed;Follow up appointment Falls evaluation completed Falls evaluation completed Falls evaluation completed    [place]:  Any stairs in or around the home? Yes  If so, are there any without handrails? No  Home free of loose throw rugs in walkways, pet beds, electrical cords, etc? Yes  Adequate lighting in your home to reduce risk of falls? Yes   ASSISTIVE DEVICES UTILIZED TO PREVENT FALLS:  Life alert? No  Use of a cane, walker or w/c? Yes  Grab bars in the bathroom? No  Shower chair or bench in shower? No  Elevated toilet seat or a handicapped toilet? No  Cognitive Function:    11/07/2019   10:23 AM 09/22/2018    9:32 AM  MMSE - Mini Mental State Exam  Orientation to time 5 5  Orientation to Place 5 5  Registration 3 3  Attention/ Calculation 5 5  Recall 3 3  Language- name 2 objects 2 2  Language- repeat 1 1  Language- follow 3 step command 3 3  Language- read & follow direction 1 1  Write a sentence 0 1  Copy design 1 1  Total score 29 30        12/25/2022   10:15 AM 12/18/2021    2:06 PM 12/31/2020    8:41 AM  6CIT Screen  What Year? 0 points 0 points 0 points  What month? 0 points 0 points 0 points  What time? 0 points 0 points 0 points  Count back from 20 0 points 0 points 0 points  Months in reverse 0 points 0 points 0 points  Repeat phrase 2 points 0 points 4 points  Total Score 2 points 0 points 4 points    Immunizations Immunization History  Administered Date(s) Administered   Fluad Quad(high Dose 65+) 10/21/2020   Influenza Inj Mdck Quad Pf 11/07/2019   Influenza-Unspecified 08/23/2017   PNEUMOCOCCAL CONJUGATE-20 07/09/2021    TDAP status: Due, Education has been  provided regarding the importance of this vaccine. Advised may receive this vaccine at local pharmacy or Health Dept. Aware to provide a copy of the vaccination record if obtained from local pharmacy or Health Dept. Verbalized acceptance and understanding.  Flu Vaccine status: Declined, Education has been provided regarding the importance of this vaccine but patient still declined. Advised may receive this vaccine at local pharmacy or Health Dept. Aware to provide a copy of the vaccination record if obtained from local pharmacy or Health Dept. Verbalized acceptance and understanding.  Pneumococcal vaccine status: Up to date  Covid-19 vaccine status: Declined, Education has been provided regarding the importance of this vaccine but patient still declined. Advised may receive this vaccine at local pharmacy or Health Dept.or vaccine clinic. Aware to provide a copy of the vaccination record if obtained from local pharmacy or Health Dept. Verbalized acceptance and understanding.  Qualifies for Shingles Vaccine? Yes   Zostavax completed No   Shingrix Completed?: No.    Education has been provided regarding the importance of this vaccine. Patient has been advised to call insurance company to determine out of pocket expense if they have not yet received this vaccine. Advised may also receive vaccine at local pharmacy or Health Dept. Verbalized acceptance and understanding.  Screening Tests Health Maintenance  Topic Date Due   COVID-19 Vaccine (1) Never done   DTaP/Tdap/Td (1 - Tdap) Never done   Zoster Vaccines- Shingrix (1 of 2) Never done   Lung Cancer Screening  Never done   MAMMOGRAM  04/16/2022   INFLUENZA VACCINE  01/31/2023 (Originally 06/02/2022)   HEMOGLOBIN A1C  12/31/2022   FOOT EXAM  01/07/2023   Diabetic kidney evaluation - Urine ACR  02/11/2023   Diabetic kidney evaluation - eGFR measurement  05/15/2023   OPHTHALMOLOGY EXAM  08/06/2023   Medicare Annual Wellness (AWV)  12/26/2023    COLONOSCOPY (Pts 45-59yr Insurance coverage will need to be confirmed)  07/20/2028   Pneumonia Vaccine 70 Years old  Completed   DEXA SCAN  Completed   Hepatitis C Screening  Completed   HPV VACCINES  Aged Out    Health Maintenance  Health Maintenance Due  Topic Date Due   COVID-19 Vaccine (1) Never done   DTaP/Tdap/Td (1 - Tdap) Never done   Zoster Vaccines- Shingrix (1 of 2) Never done   Lung Cancer Screening  Never done   MAMMOGRAM  04/16/2022    Colorectal cancer screening: Type of screening: Colonoscopy. Completed 07/20/18. Repeat every 5 years- dad had colon cancer  Mammogram status: Ordered 02/10/22. Pt provided with contact info and advised to call to schedule appt.   Bone Density status: Ordered 02/10/22. Pt provided with contact info and advised to call to schedule appt.  Lung Cancer Screening: (Low Dose CT Chest recommended if Age 53-80 years, 30 pack-year currently smoking OR have quit w/in 15years.) does not qualify.    Additional Screening:  Hepatitis C Screening: does qualify; Completed 02/10/22  Vision Screening: Recommended annual ophthalmology exams for early detection of glaucoma and other disorders of the eye. Is the patient up to date with their annual eye exam?  Yes  Who is the provider or what is the name of the office in which the patient attends annual eye exams? [facility] If pt is not established with a provider, would they like to be referred to a provider to establish care? No .   Dental Screening: Recommended annual dental exams for proper oral hygiene  Community Resource Referral / Chronic Care Management: CRR required this visit?  No   CCM required this visit?  No      Plan:     I have personally reviewed and noted the following in the patient's chart:   Medical and social history Use of alcohol, tobacco or illicit drugs  Current medications and supplements including opioid prescriptions. Patient is not currently taking opioid  prescriptions. Functional ability and status Nutritional status Physical activity Advanced directives List of other physicians Hospitalizations, surgeries, and ER visits in previous 12 months Vitals Screenings to include cognitive, depression, and falls Referrals and appointments  In addition, I have reviewed and discussed with patient certain preventive protocols, quality metrics, and best practice recommendations. A written personalized care plan for preventive services as well as general preventive health recommendations were provided to patient.     Dionisio David, LPN   [id number]   Nurse Notes: none

## 2022-12-31 ENCOUNTER — Encounter: Payer: Medicare HMO | Admitting: Internal Medicine

## 2022-12-31 DIAGNOSIS — E1159 Type 2 diabetes mellitus with other circulatory complications: Secondary | ICD-10-CM

## 2022-12-31 DIAGNOSIS — M542 Cervicalgia: Secondary | ICD-10-CM | POA: Diagnosis not present

## 2022-12-31 DIAGNOSIS — Z8673 Personal history of transient ischemic attack (TIA), and cerebral infarction without residual deficits: Secondary | ICD-10-CM | POA: Diagnosis not present

## 2022-12-31 DIAGNOSIS — R200 Anesthesia of skin: Secondary | ICD-10-CM | POA: Diagnosis not present

## 2022-12-31 DIAGNOSIS — R482 Apraxia: Secondary | ICD-10-CM | POA: Diagnosis not present

## 2022-12-31 DIAGNOSIS — R252 Cramp and spasm: Secondary | ICD-10-CM | POA: Diagnosis not present

## 2022-12-31 DIAGNOSIS — I10 Essential (primary) hypertension: Secondary | ICD-10-CM

## 2022-12-31 DIAGNOSIS — R531 Weakness: Secondary | ICD-10-CM | POA: Diagnosis not present

## 2022-12-31 DIAGNOSIS — R2689 Other abnormalities of gait and mobility: Secondary | ICD-10-CM | POA: Diagnosis not present

## 2022-12-31 DIAGNOSIS — M545 Low back pain, unspecified: Secondary | ICD-10-CM | POA: Diagnosis not present

## 2022-12-31 DIAGNOSIS — R0600 Dyspnea, unspecified: Secondary | ICD-10-CM | POA: Diagnosis not present

## 2023-01-01 ENCOUNTER — Other Ambulatory Visit: Payer: Self-pay | Admitting: Internal Medicine

## 2023-01-04 DIAGNOSIS — E669 Obesity, unspecified: Secondary | ICD-10-CM | POA: Diagnosis not present

## 2023-01-04 DIAGNOSIS — E1142 Type 2 diabetes mellitus with diabetic polyneuropathy: Secondary | ICD-10-CM | POA: Diagnosis not present

## 2023-01-04 DIAGNOSIS — Z794 Long term (current) use of insulin: Secondary | ICD-10-CM | POA: Diagnosis not present

## 2023-01-04 DIAGNOSIS — I10 Essential (primary) hypertension: Secondary | ICD-10-CM | POA: Diagnosis not present

## 2023-01-04 DIAGNOSIS — E785 Hyperlipidemia, unspecified: Secondary | ICD-10-CM | POA: Diagnosis not present

## 2023-01-04 DIAGNOSIS — E1169 Type 2 diabetes mellitus with other specified complication: Secondary | ICD-10-CM | POA: Diagnosis not present

## 2023-01-04 NOTE — Telephone Encounter (Signed)
Requested Prescriptions  Pending Prescriptions Disp Refills   amLODipine (NORVASC) 5 MG tablet [Pharmacy Med Name: AMLODIPINE BESYLATE 5 MG TAB] 90 tablet 0    Sig: Take 1 tablet (5 mg total) by mouth daily.     Cardiovascular: Calcium Channel Blockers 2 Passed - 01/01/2023  1:16 PM      Passed - Last BP in normal range    BP Readings from Last 1 Encounters:  11/05/22 128/78         Passed - Last Heart Rate in normal range    Pulse Readings from Last 1 Encounters:  11/05/22 82         Passed - Valid encounter within last 6 months    Recent Outpatient Visits           6 months ago Uncontrolled type 2 diabetes mellitus with hyperglycemia [facility])   [facility] [place], Coralie Keens, NP   10 months ago Encounter for general adult medical examination with abnormal findings   [facility] [place], Coralie Keens, NP   12 months ago COPD exacerbation [facility])   [facility] [place], Coralie Keens, NP   1 year ago Mixed diabetic hyperlipidemia associated with type 2 diabetes mellitus [facility])   [facility] [place], [place] W, NP   2 years ago Uncontrolled type 2 diabetes mellitus with hyperglycemia [facility])   [facility] [place], Devonne Doughty, DO       Future Appointments             In 3 weeks MacDiarm[place], Nicki Reaper, MD [place]   In 3 weeks [place], Coralie Keens, NP [facility]

## 2023-01-11 DIAGNOSIS — Z8673 Personal history of transient ischemic attack (TIA), and cerebral infarction without residual deficits: Secondary | ICD-10-CM | POA: Diagnosis not present

## 2023-01-11 DIAGNOSIS — R0609 Other forms of dyspnea: Secondary | ICD-10-CM | POA: Diagnosis not present

## 2023-01-11 DIAGNOSIS — G4719 Other hypersomnia: Secondary | ICD-10-CM | POA: Diagnosis not present

## 2023-01-11 DIAGNOSIS — G4733 Obstructive sleep apnea (adult) (pediatric): Secondary | ICD-10-CM | POA: Diagnosis not present

## 2023-01-11 DIAGNOSIS — Z6839 Body mass index (BMI) 39.0-39.9, adult: Secondary | ICD-10-CM | POA: Diagnosis not present

## 2023-01-14 NOTE — Discharge Instructions (Signed)

## 2023-01-15 ENCOUNTER — Ambulatory Visit
Admission: RE | Admit: 2023-01-15 | Discharge: 2023-01-15 | Disposition: A | Discharge status: Home / Self Care | Payer: Medicare HMO | Source: Ambulatory Visit | Attending: Physical Medicine & Rehabilitation | Admitting: Physical Medicine & Rehabilitation

## 2023-01-15 DIAGNOSIS — M4802 Spinal stenosis, cervical region: Secondary | ICD-10-CM | POA: Diagnosis not present

## 2023-01-15 DIAGNOSIS — M5412 Radiculopathy, cervical region: Secondary | ICD-10-CM | POA: Diagnosis not present

## 2023-01-15 DIAGNOSIS — M542 Cervicalgia: Secondary | ICD-10-CM

## 2023-01-15 MED ORDER — IOPAMIDOL (ISOVUE-M 300) INJECTION 61%
1.0000 mL | Freq: Once | INTRAMUSCULAR | Status: AC | PRN
  Administered 2023-01-15: 1 mL via EPIDURAL

## 2023-01-15 MED ORDER — TRIAMCINOLONE ACETONIDE 40 MG/ML IJ SUSP (RADIOLOGY)
60.0000 mg | Freq: Once | INTRAMUSCULAR | Status: AC
  Administered 2023-01-15: 60 mg via EPIDURAL

## 2023-01-18 ENCOUNTER — Other Ambulatory Visit: Payer: Self-pay | Admitting: Internal Medicine

## 2023-01-18 DIAGNOSIS — K5900 Constipation, unspecified: Secondary | ICD-10-CM

## 2023-01-18 NOTE — Progress Notes (Deleted)
Referring Physician:  Jearld Fenton, NP [address] [place],  [place] [address]  Primary Physician:  Jearld Fenton, NP  History of Present Illness: 01/18/2023 Rachel Nielsen has a history of OSA, GERD, DM with neuropathy, hyperlipidemia, CKD 3, depression, GAD, emphysema, and history of CVA.   History of chronic neck pain that has been worse over last year. History of multiple strokes. Last one was in July and she has some right sided weakness.   Last seen by me on 12/01/22 for a phone visit. Her updated cervical MRI showed mild central stenosis C3-C7. She had moderate right C3 and bilateral C4 foraminal stenosis along with mild to moderate right C6 and bilateral C7 foraminal stenosis. She had diffuse cervical spondylosis as well.   HHPT was orderef for her at her last visit. She had right C7-T1 IL ESI with Dr. Barbie Banner at [facility] on 01/15/23.   She is here for follow up.         History of chronic neck pain that has been worse over last year. She has constant neck pain that radiates to right shoulder blade. She has occasional right arm pain into her entire hand. She notes weakness in right arm since her stroke in July. She has pain in left hand (middle/ring/small finger), but no arm pain.   Pain is worse with turning her head and when it rains.   She has some improvement of some of the right sided neck pain since having her injection 2 days ago, but she still has constant posterior and left/right sided neck pain with radiation into right shoulder blade.   She has some intermittent right lateral thigh pain that started after CVA in July- she had right sided symptoms with this that have improved 75%. She had previous stroke with left sided symptoms that have improved.     She is on PLAVIX.   She vapes with nicotine.   Conservative measures:  Physical therapy: did HHPT with [facility] after stroke (worked on neck as well)  Multimodal medical therapy including regular antiinflammatories:  neurontin, lyrica, mobic  Injections:  -s/p right C3-4 TFESI 09/09/2021 with 75% relief -s/p right C3-4 TFESI 03/10/2022 with 50% relief  -s/p right C3-4 TFESI 11/03/22  Past Surgery: no spinal surgery  Rachel Nielsen has some right sided weakness and balance issues since her stroke in July. This is all improving.   The symptoms are causing a significant impact on the patient's life.   Review of Systems:  A 10 point review of systems is negative, except for the pertinent positives and negatives detailed in the HPI.  Past Medical History: Past Medical History:  Diagnosis Date   Allergy    Anxiety    Asthma    Colon polyps    Constipation    COPD (chronic obstructive pulmonary disease) (HCC)    Depression    GERD (gastroesophageal reflux disease)    Glaucoma    Heart attack ([place])    Hypertension    Sleep apnea    Sleep apnea    Stroke [facility])    Urinary incontinence     Past Surgical History: Past Surgical History:  Procedure Laterality Date   APPENDECTOMY     CHOLECYSTECTOMY     COLONOSCOPY WITH PROPOFOL N/A 07/20/2018   Procedure: COLONOSCOPY WITH PROPOFOL;  Surgeon: [place], Benay Pike, MD;  Location: [facility] ENDOSCOPY;  Service: Gastroenterology;  Laterality: N/A;   ESOPHAGOGASTRODUODENOSCOPY N/A 07/20/2018   Procedure: ESOPHAGOGASTRODUODENOSCOPY (EGD);  Surgeon: [place],  Benay Pike, MD;  Location: [facility] ENDOSCOPY;  Service: Gastroenterology;  Laterality: N/A;   GASTRECTOMY     TUBAL LIGATION     VAGINAL HYSTERECTOMY      Allergies: Allergies as of 01/19/2023 - Review Complete 12/25/2022  Allergen Reaction Noted   Ace inhibitors Cough 12/21/2017    Medications: Outpatient Encounter Medications as of 01/19/2023  Medication Sig   ACCU-CHEK GUIDE test strip Use to check blood sugar up to twice a day as directed   Accu-Chek Softclix Lancets lancets Use to check blood sugar up to twice a day as directed   albuterol (PROVENTIL) (2.5 MG/3ML) 0.083% nebulizer solution Inhale 3 mLs  (2.5 mg total) into the lungs every 4 (four) hours as needed for wheezing or shortness of breath. (Patient taking differently: Inhale 2.5 mg into the lungs every 6 (six) hours as needed for wheezing or shortness of breath.)   albuterol (VENTOLIN HFA) 108 (90 Base) MCG/ACT inhaler Inhale 2-4 puffs by mouth every 4 hours as needed for wheezing, cough, and/or shortness of breath (Patient taking differently: 2 puffs every 6 (six) hours as needed.)   ALPRAZolam (XANAX) 1 MG tablet Take 1 mg by mouth 3 (three) times daily as needed for anxiety or sleep.   amLODipine (NORVASC) 5 MG tablet Take 1 tablet (5 mg total) by mouth daily.   Asenapine Maleate 10 MG SUBL Place 5 mg under the tongue at bedtime.   aspirin 81 MG EC tablet Take 1 tablet (81 mg total) by mouth daily. Swallow whole.   bisoprolol (ZEBETA) 10 MG tablet Take 1 tablet (10 mg total) by mouth daily.   buPROPion (WELLBUTRIN XL) 300 MG 24 hr tablet Take 1 tablet (300 mg total) by mouth daily.   busPIRone (BUSPAR) 15 MG tablet Take 15 mg by mouth 2 (two) times daily.   clopidogrel (PLAVIX) 75 MG tablet Take 75 mg by mouth daily.   docusate sodium (COLACE) 100 MG capsule Take 100 mg by mouth daily.   Dulaglutide 3 MG/0.5ML SOPN Inject 0.5 mLs (3 mg total) subcutaneously once a week   DULoxetine (CYMBALTA) 60 MG capsule Take 60 mg by mouth daily.   famotidine (PEPCID) 20 MG tablet Take 20 mg by mouth 2 (two) times daily.   FARXIGA 10 MG TABS tablet Take 10 mg by mouth daily.   fluticasone (FLONASE) 50 MCG/ACT nasal spray Place 1 spray into both nostrils daily.   fluticasone-salmeterol (ADVAIR) 250-50 MCG/ACT AEPB Inhale 1 puff into the lungs 2 (two) times daily.   gabapentin (NEURONTIN) 600 MG tablet Take 600-900 mg by mouth 3 (three) times daily. 600 mg AM, 600 mg pm and 900 mg at bedtime   GLOBAL EASE INJECT PEN NEEDLES 32G X 4 MM MISC Use as directed with insulin   Insulin Glargine Solostar (LANTUS) 100 UNIT/ML Solostar Pen Inject 20 Units into  the skin at bedtime.   Insulin Pen Needle (BD PEN NEEDLE NANO U/F) 32G X 4 MM MISC Inject 1 Device into the skin daily.   Insulin Syringe-Needle U-100 (INSULIN SYRINGE 1CC/31GX5/16") 31G X 5/16" 1 ML MISC 1 Syringe by Does not apply route in the morning, at noon, in the evening, and at bedtime.   lactulose (CHRONULAC) 10 GM/15ML solution Take 30 g by mouth 2 (two) times daily.   LINZESS 290 MCG CAPS capsule Take 1 capsule (290 mcg total) by mouth daily before breakfast.   lurasidone (LATUDA) 40 MG TABS tablet Take 40 mg by mouth daily.   meloxicam (MOBIC) 15  MG tablet Take 15 mg by mouth daily. (Patient not taking: Reported on 12/25/2022)   metFORMIN (GLUCOPHAGE) 1000 MG tablet Take 1,000 mg by mouth 2 (two) times daily with a meal.   mirabegron ER (MYRBETRIQ) 50 MG TB24 tablet Take 1 tablet (50 mg total) by mouth daily.   montelukast (SINGULAIR) 10 MG tablet Take 1 tablet (10 mg total) by mouth daily.   pantoprazole (PROTONIX) 40 MG tablet Take 1 tablet (40 mg total) by mouth 2 (two) times daily.   predniSONE (DELTASONE) 5 MG tablet Take 5 mg by mouth daily. (Patient not taking: Reported on 12/25/2022)   pregabalin (LYRICA) 75 MG capsule Take 100 mg by mouth 3 (three) times daily.   rosuvastatin (CRESTOR) 10 MG tablet Take 1 tablet (10 mg total) by mouth daily.   TRAVATAN Z 0.004 % SOLN ophthalmic solution Place 1 drop into both eyes at bedtime.   No facility-administered encounter medications on file as of 01/19/2023.    Social History: Social History   Tobacco Use   Smoking status: Former   Smokeless tobacco: Never   Tobacco comments:    Uses vape w/ nicotine  Vaping Use   Vaping Use: Every day   Substances: Nicotine, Flavoring  Substance Use Topics   Alcohol use: Not Currently    Comment: monthly or less    Drug use: No    Family Medical History: Family History  Problem Relation Age of Onset   Hypertension Mother    Atrial fibrillation Mother    Heart attack Mother     Hypertension Father    Stomach cancer Father    Diabetes Father    Liver cancer Father    Diabetes Sister    Hypertension Sister     Physical Examination: There were no vitals filed for this visit.   General: Patient is well developed, well nourished, calm, collected, and in no apparent distress. Attention to examination is appropriate.  Respiratory: Patient is breathing without any difficulty.   NEUROLOGICAL:     Awake, alert, oriented to person, place, and time.  Speech is clear and fluent. Fund of knowledge is appropriate.   Cranial Nerves: Pupils equal round and reactive to light.  Facial tone is symmetric.    She has mild diffuse trapezial tenderness right > left with some right scapular tenderness.   No abnormal lesions on exposed skin.   Strength: Side Biceps Triceps Deltoid Interossei Grip Wrist Ext. Wrist Flex.  R 5 5 5  +4 +4 5 5   L 5 5 5 5 5 5 5    Side Iliopsoas Quads Hamstring PF DF EHL  R +4 5 5 5 5 5   L 5 5 5 5 5 5    Reflexes are 2+ and symmetric at the biceps, triceps, brachioradialis, patella and achilles.   Hoffman's is absent.  Clonus is not present.   Bilateral upper and lower extremity sensation is intact to light touch.     She has slow gait and ambulates with a walker.    Medical Decision Making  Imaging: None   Assessment and Plan: Rachel Nielsen is a pleasant 72 y.o. female with history of chronic neck pain that has been worse over last year. She has constant neck pain that radiates to right shoulder blade with occasional right arm pain into her entire hand. She notes weakness in right side since her stroke in July.   Minimal improvement in right sided neck pain with right C3-4 TFESI 11/03/22 by Dr. Alba Destine.   Cervical  MRI from 2022 showed cervical spondylosis/DDD with multilevel foraminal stenosis worse at C3-C4. A lot of her pain appears more myofascial in nature.  Treatment options discussed with patient and following plan made:   - Will  update cervical MRI scan to evaluate chronic neck pain and intermittent right arm pain x years. No improvement with time, injections, HHPT, or medications.  - Depending on MRI results, may need to revisit outpatient PT for cervical spine and consider dry needling, myofasical release.  - May also consider cervical facet and/or TPI with Dr. Alba Destine.  - Will set up phone visit to review MRI once I have the results.   I spent a total of 30 minutes in face-to-face and non-face-to-face activities related to this patient's care today including review of outside records, review of imaging, review of symptoms, physical exam, discussion of differential diagnosis, discussion of treatment options, and documentation.   Thank you for involving me in the care of this patient.   Geronimo Boot PA-C Dept. of Neurosurgery

## 2023-01-19 ENCOUNTER — Ambulatory Visit: Payer: Self-pay | Admitting: Orthopedic Surgery

## 2023-01-19 ENCOUNTER — Other Ambulatory Visit: Payer: Self-pay | Admitting: Internal Medicine

## 2023-01-19 DIAGNOSIS — M3501 Sicca syndrome with keratoconjunctivitis: Secondary | ICD-10-CM | POA: Diagnosis not present

## 2023-01-19 DIAGNOSIS — H402232 Chronic angle-closure glaucoma, bilateral, moderate stage: Secondary | ICD-10-CM | POA: Diagnosis not present

## 2023-01-19 NOTE — Telephone Encounter (Signed)
Requested Prescriptions  Pending Prescriptions Disp Refills   fluticasone (FLONASE) 50 MCG/ACT nasal spray [Pharmacy Med Name: FLUTICASONE PROP 50 MCG SPRAY] 16 g 0    Sig: Place 1 spray into both nostrils daily.     Ear, Nose, and Throat: Nasal Preparations - Corticosteroids Passed - 01/19/2023  7:34 AM      Passed - Valid encounter within last 12 months    Recent Outpatient Visits           6 months ago Uncontrolled type 2 diabetes mellitus with hyperglycemia [facility])   [facility] [place], Coralie Keens, NP   11 months ago Encounter for general adult medical examination with abnormal findings   [place], Coralie Keens, NP   1 year ago COPD exacerbation [facility])   [facility] [place], Coralie Keens, NP   1 year ago Mixed diabetic hyperlipidemia associated with type 2 diabetes mellitus [facility])   [facility] [place], [place] W, NP   2 years ago Uncontrolled type 2 diabetes mellitus with hyperglycemia [facility])   [place], DO       Future Appointments             In 6 days MacDiarmid, Nicki Reaper, MD [place]   In 1 week [place], NP [facility], [facility]

## 2023-01-19 NOTE — Telephone Encounter (Signed)
Requested Prescriptions  Pending Prescriptions Disp Refills   LINZESS 290 MCG CAPS capsule [Pharmacy Med Name: Rolan Lipa 290 MCG CAPSULE] 90 capsule 0    Sig: Take 1 capsule (290 mcg total) by mouth daily before breakfast.     Gastroenterology: Irritable Bowel Syndrome Passed - 01/18/2023  8:04 AM      Passed - Valid encounter within last 12 months    Recent Outpatient Visits           6 months ago Uncontrolled type 2 diabetes mellitus with hyperglycemia [facility])   [facility] [place], Coralie Keens, NP   11 months ago Encounter for general adult medical examination with abnormal findings   [place], Coralie Keens, NP   1 year ago COPD exacerbation [facility])   [facility] [place], Coralie Keens, NP   1 year ago Mixed diabetic hyperlipidemia associated with type 2 diabetes mellitus [facility])   [facility] [place], [place] W, NP   2 years ago Uncontrolled type 2 diabetes mellitus with hyperglycemia [facility])   [place], DO       Future Appointments             In 6 days MacDiarmid, Nicki Reaper, MD [place]   In 1 week [place], NP [facility], [facility]

## 2023-01-25 ENCOUNTER — Ambulatory Visit: Payer: Medicare HMO | Admitting: Urology

## 2023-01-26 ENCOUNTER — Other Ambulatory Visit: Payer: Self-pay | Admitting: Internal Medicine

## 2023-01-26 ENCOUNTER — Encounter: Payer: Medicare HMO | Admitting: Internal Medicine

## 2023-01-26 DIAGNOSIS — I10 Essential (primary) hypertension: Secondary | ICD-10-CM

## 2023-01-26 NOTE — Telephone Encounter (Signed)
Requested medication (s) are due for refill today: Due 10/31/22  Requested medication (s) are on the active medication list: yes   Last refill: 10/31/22   #90  0 refills  Future visit scheduled Yes  Notes to clinic:  Pt has appt tomorrow, please review.  Requested Prescriptions  Pending Prescriptions Disp Refills   bisoprolol (ZEBETA) 10 MG tablet [Pharmacy Med Name: BISOPROLOL FUMARATE 10 MG TAB] 90 tablet 0    Sig: Take 1 tablet (10 mg total) by mouth daily. OFFICE VISIT NEEDED FOR ADDITIONAL REFILLS     Cardiovascular: Beta Blockers 2 Failed - 01/26/2023  7:58 AM      Failed - Cr in normal range and within 360 days    Creat  Date Value Ref Range Status  02/10/2022 0.94 0.50 - 1.05 mg/dL Final   Creatinine, Ser  Date Value Ref Range Status  05/14/2022 1.04 (H) 0.44 - 1.00 mg/dL Final   Creatinine, Urine  Date Value Ref Range Status  02/10/2022 43 20 - 275 mg/dL Final         Failed - Last BP in normal range    BP Readings from Last 1 Encounters:  01/15/23 (!) 169/96         Passed - Last Heart Rate in normal range    Pulse Readings from Last 1 Encounters:  01/15/23 84         Passed - Valid encounter within last 6 months    Recent Outpatient Visits           7 months ago Uncontrolled type 2 diabetes mellitus with hyperglycemia [facility])   [facility] [place], Coralie Keens, NP   11 months ago Encounter for general adult medical examination with abnormal findings   [place], Coralie Keens, NP   1 year ago COPD exacerbation [facility])   [facility] [place], Coralie Keens, NP   1 year ago Mixed diabetic hyperlipidemia associated with type 2 diabetes mellitus [facility])   [facility] [place], [place] W, NP   2 years ago Uncontrolled type 2 diabetes mellitus with hyperglycemia [facility])   [facility] [place], Devonne Doughty, DO       Future Appointments              Tomorrow [place], Coralie Keens, NP [facility], [place]   In 6 days Bjorn Loser, [place]

## 2023-01-27 ENCOUNTER — Encounter: Payer: Medicare HMO | Admitting: Internal Medicine

## 2023-02-01 ENCOUNTER — Encounter: Payer: Self-pay | Admitting: Urology

## 2023-02-01 ENCOUNTER — Ambulatory Visit: Payer: Medicare HMO | Admitting: Urology

## 2023-02-12 ENCOUNTER — Ambulatory Visit (INDEPENDENT_AMBULATORY_CARE_PROVIDER_SITE_OTHER): Payer: Medicare HMO | Admitting: Internal Medicine

## 2023-02-12 ENCOUNTER — Telehealth: Payer: Medicare HMO

## 2023-02-12 ENCOUNTER — Encounter: Payer: Self-pay | Admitting: Internal Medicine

## 2023-02-12 ENCOUNTER — Ambulatory Visit (INDEPENDENT_AMBULATORY_CARE_PROVIDER_SITE_OTHER): Payer: Medicare HMO

## 2023-02-12 VITALS — BP 126/84 | HR 82 | Temp 96.8°F | Wt 207.0 lb

## 2023-02-12 DIAGNOSIS — E1165 Type 2 diabetes mellitus with hyperglycemia: Secondary | ICD-10-CM

## 2023-02-12 DIAGNOSIS — Z0001 Encounter for general adult medical examination with abnormal findings: Secondary | ICD-10-CM | POA: Diagnosis not present

## 2023-02-12 DIAGNOSIS — Z1231 Encounter for screening mammogram for malignant neoplasm of breast: Secondary | ICD-10-CM | POA: Diagnosis not present

## 2023-02-12 DIAGNOSIS — Z6835 Body mass index (BMI) 35.0-35.9, adult: Secondary | ICD-10-CM | POA: Diagnosis not present

## 2023-02-12 DIAGNOSIS — I10 Essential (primary) hypertension: Secondary | ICD-10-CM

## 2023-02-12 DIAGNOSIS — Z780 Asymptomatic menopausal state: Secondary | ICD-10-CM

## 2023-02-12 NOTE — Assessment & Plan Note (Signed)
Encouraged diet for weight loss 

## 2023-02-12 NOTE — Patient Instructions (Signed)
Please call the care guide team at [phone] if you need to cancel or reschedule your appointment.   If you are experiencing a Mental Health or Behavioral Health Crisis or need someone to talk to, please call the Suicide and Crisis Lifeline: 988 call the [place] National Suicide Prevention Lifeline: 732-214-9406 or TTY: 5108271545 TTY 450-448-0600) to talk to a trained counselor call 1-800-273-TALK (toll free, 24 hour hotline)   Following is a copy of the CCM Program Consent:  CCM service includes personalized support from designated clinical staff supervised by the physician, including individualized plan of care and coordination with other care providers 24/7 contact phone numbers for assistance for urgent and routine care needs. Service will only be billed when office clinical staff spend 20 minutes or more in a month to coordinate care. Only one practitioner may furnish and bill the service in a calendar month. The patient may stop CCM services at amy time (effective at the end of the month) by phone call to the office staff. The patient will be responsible for cost sharing (co-pay) or up to 20% of the service fee (after annual deductible is met)  Following is a copy of your full provider care plan:   Goals Addressed             This Visit's Progress    CCM Expected Outcome:  Monitor, Self-Manage and Reduce Symptoms of Diabetes       Current Barriers:  Knowledge Deficits related to hypoglycemia and how to effectively manage low blood sugars Chronic Disease Management support and education needs related to effective management of DM  Lab Results  Component Value Date   HGBA1C 7.1 (A) 06/30/2022   10-24-2022 at [facility] Endocrinologist: 5.8. Has a POC one at the office that has not resulted in the chart yet.  Planned Interventions: Provided education to patient about basic DM disease process. The patient states that she feels like she is doing well with managing her DM  health and well be. Likes the [employer] and it holds her accountable. She sees the endocrinologist coming up. Saw pcp today; Reviewed medications with patient and discussed importance of medication adherence. The patient is compliant with medications. Works closely with her endocrinologist. Education and review of dietary habits and insulin coverage;        Reviewed prescribed diet with patient heart healthy/ADA diet. Review of heart healthy/ADA diet and the patient states she is monitoring what she eats but sometimes her blood sugars are still up and down. She has multiple chronic conditions that she feels impacts her health and well being; Counseled on importance of regular laboratory monitoring as prescribed. Has regular lab work drawn. Education and support given;        Discussed plans with patient for ongoing care management follow up and provided patient with direct contact information for care management team;      Provided patient with written educational materials related to hypo and hyperglycemia and importance of correct treatment. Education given and discussed keeping glucose tablets and snacks around in the event of low blood sugars. She states the lowest she has seen her blood sugar is 68 and her [name] goes off when it gets below 70 and she knows she needs to eat something. She says the hight she has seen is 246 and that is not happening often;       Reviewed scheduled/upcoming provider appointments including: 08-18-2023 at 1020 am     Advised patient, providing education and rationale, to  check cbg three times daily and when you have symptoms of low or high blood sugar and record. Is checking more frequently since she had the hypoglycemic event at the end of November. Has seen the endocrinologist and has been given parameters to call and when to take insulin. She has the freestyle libre and her blood sugars have been on average 145. She states it was 153 this am and range has been 68 to  246. Education and support given.  call provider for findings outside established parameters. The patient has parameters to go by and knows when to call the endocrinologist for changes.;       Referral made to pharmacy team for assistance with ongoing support and education for medication and management of medications effectively. Works with the pharm D on a regular basis for effective management of DM;       Review of patient status, including review of consultants reports, relevant laboratory and other test results, and medications completed;       Advised patient to discuss changes in DM, questions or concerns with provider;      Screening for signs and symptoms of depression related to chronic disease state;        Assessed social determinant of health barriers;    Saw the eye doctor recently and her pressure was up and her tear ducts were blocked. She has been back a couple of times for evaluation and treatment.      The patient is seeing her psychiatrist on a regular basis. She states that overall she is feeling better. She denies any acute changes in her health and well being. She is thankful that things are stable. Encouraged the patient to reach out to specialist for changes in mood, anxiety, depression, or other mental health needs and concerns  Symptom Management: Take medications as prescribed   Attend all scheduled provider appointments Call provider office for new concerns or questions  call the Suicide and Crisis Lifeline: 988 call the [place] National Suicide Prevention Lifeline: 872 632 8724 or TTY: 249 363 3379 TTY [phone]) to talk to a trained counselor call 1-800-273-TALK (toll free, 24 hour hotline) if experiencing a Mental Health or Behavioral Health Crisis  check feet daily for cuts, sores or redness trim toenails straight across manage portion size wash and dry feet carefully every day wear comfortable, cotton socks wear comfortable, well-fitting shoes  Follow Up  Plan: Telephone follow up appointment with care management team member scheduled for: 04-30-2023 at 1 pm       CCM Expected Outcome:  Monitor, Self-Manage, and Reduce Symptoms of Hypertension       Current Barriers:  Knowledge Deficits related to normal blood pressure readings and the need to maintain control of blood pressure to prevent any adverse effects or increase risk of heart attack and stroke Chronic Disease Management support and education needs related to effective management of HTN BP Readings from Last 3 Encounters:  02/12/23 126/84  01/15/23 (!) 169/96  11/05/22 128/78     Planned Interventions: Evaluation of current treatment plan related to hypertension self management and patient's adherence to plan as established by provider. The patients blood pressures are stable. The patient saw the pcp today. She denies any new concerns. The patient states that she has had a shot in her neck since the last outreach and the pain in her neck is so much better. Its down from an eight or nine to a three or four. The patient is happy this is helping her. It  is helping her overall health and well being.   Provided education to patient re: stroke prevention, s/s of heart attack and stroke. The patient has had a stroke in the past. She knows the risk and has several specialist she sees on a regular basis; Reviewed prescribed diet heart healthy/ADA diet. Education provided. The patient tries to adhere to a heart healthy/ADA diet Reviewed medications with patient and discussed importance of compliance. The patient verbalized compliance with medications. Denies any medications concerns today. Works with pharm D on a regular basis. Has an upcoming appointment with the pharm D. Reminder provided today. No medication changes at her appointment today.;  Discussed plans with patient for ongoing care management follow up and provided patient with direct contact information for care management team; Advised  patient, providing education and rationale, to monitor blood pressure daily and record, calling PCP for findings outside established parameters;  Reviewed scheduled/upcoming provider appointments including: Saw pcp today. Sees specialist on a regular basis. Has follow up with the pcp on 08-18-2023 at 1020 am Advised patient to discuss changes in her HTN or heart health with provider; Provided education on prescribed diet heart healthy/ADA. Review and education provided;  Discussed complications of poorly controlled blood pressure such as heart disease, stroke, circulatory complications, vision complications, kidney impairment, sexual dysfunction;  Screening for signs and symptoms of depression related to chronic disease state;  Assessed social determinant of health barriers;  The patient has noticed changes in her hearing and feels she needs to have a hearing test in the new year.   Symptom Management: Take medications as prescribed   Attend all scheduled provider appointments Call provider office for new concerns or questions  call the Suicide and Crisis Lifeline: 988 call the [place] National Suicide Prevention Lifeline: (920)600-7993 or TTY: (475)590-9409 TTY 930-179-5692) to talk to a trained counselor call 1-800-273-TALK (toll free, 24 hour hotline) if experiencing a Mental Health or Behavioral Health Crisis  check blood pressure weekly learn about high blood pressure take blood pressure log to all doctor appointments call doctor for signs and symptoms of high blood pressure develop an action plan for high blood pressure keep all doctor appointments take medications for blood pressure exactly as prescribed report new symptoms to your doctor  Follow Up Plan: Telephone follow up appointment with care management team member scheduled for:04-30-2023 at 1 pm          Patient verbalizes understanding of instructions and care plan provided today and agrees to view in MyChart. Active  MyChart status and patient understanding of how to access instructions and care plan via MyChart confirmed with patient.  Telephone follow up appointment with care management team member scheduled for: 04-30-2023 at 1 pm

## 2023-02-12 NOTE — Patient Instructions (Signed)
Health Maintenance for Postmenopausal Women Menopause is a normal process in which your ability to get pregnant comes to an end. This process happens slowly over many months or years, usually between the ages of 48 and 55. Menopause is complete when you have missed your menstrual period for 12 months. It is important to talk with your health care provider about some of the most common conditions that affect women after menopause (postmenopausal women). These include heart disease, cancer, and bone loss (osteoporosis). Adopting a healthy lifestyle and getting preventive care can help to promote your health and wellness. The actions you take can also lower your chances of developing some of these common conditions. What are the signs and symptoms of menopause? During menopause, you may have the following symptoms: Hot flashes. These can be moderate or severe. Night sweats. Decrease in sex drive. Mood swings. Headaches. Tiredness (fatigue). Irritability. Memory problems. Problems falling asleep or staying asleep. Talk with your health care provider about treatment options for your symptoms. Do I need hormone replacement therapy? Hormone replacement therapy is effective in treating symptoms that are caused by menopause, such as hot flashes and night sweats. Hormone replacement carries certain risks, especially as you become older. If you are thinking about using estrogen or estrogen with progestin, discuss the benefits and risks with your health care provider. How can I reduce my risk for heart disease and stroke? The risk of heart disease, heart attack, and stroke increases as you age. One of the causes may be a change in the body's hormones during menopause. This can affect how your body uses dietary fats, triglycerides, and cholesterol. Heart attack and stroke are medical emergencies. There are many things that you can do to help prevent heart disease and stroke. Watch your blood pressure High  blood pressure causes heart disease and increases the risk of stroke. This is more likely to develop in people who have high blood pressure readings or are overweight. Have your blood pressure checked: Every 3-5 years if you are 18-39 years of age. Every year if you are 40 years old or older. Eat a healthy diet  Eat a diet that includes plenty of vegetables, fruits, low-fat dairy products, and lean protein. Do not eat a lot of foods that are high in solid fats, added sugars, or sodium. Get regular exercise Get regular exercise. This is one of the most important things you can do for your health. Most adults should: Try to exercise for at least 150 minutes each week. The exercise should increase your heart rate and make you sweat (moderate-intensity exercise). Try to do strengthening exercises at least twice each week. Do these in addition to the moderate-intensity exercise. Spend less time sitting. Even light physical activity can be beneficial. Other tips Work with your health care provider to achieve or maintain a healthy weight. Do not use any products that contain nicotine or tobacco. These products include cigarettes, chewing tobacco, and vaping devices, such as e-cigarettes. If you need help quitting, ask your health care provider. Know your numbers. Ask your health care provider to check your cholesterol and your blood sugar (glucose). Continue to have your blood tested as directed by your health care provider. Do I need screening for cancer? Depending on your health history and family history, you may need to have cancer screenings at different stages of your life. This may include screening for: Breast cancer. Cervical cancer. Lung cancer. Colorectal cancer. What is my risk for osteoporosis? After menopause, you may be   at increased risk for osteoporosis. Osteoporosis is a condition in which bone destruction happens more quickly than new bone creation. To help prevent osteoporosis or  the bone fractures that can happen because of osteoporosis, you may take the following actions: If you are 19-50 years old, get at least 1,000 mg of calcium and at least 600 international units (IU) of vitamin D per day. If you are older than age 50 but younger than age 70, get at least 1,200 mg of calcium and at least 600 international units (IU) of vitamin D per day. If you are older than age 70, get at least 1,200 mg of calcium and at least 800 international units (IU) of vitamin D per day. Smoking and drinking excessive alcohol increase the risk of osteoporosis. Eat foods that are rich in calcium and vitamin D, and do weight-bearing exercises several times each week as directed by your health care provider. How does menopause affect my mental health? Depression may occur at any age, but it is more common as you become older. Common symptoms of depression include: Feeling depressed. Changes in sleep patterns. Changes in appetite or eating patterns. Feeling an overall lack of motivation or enjoyment of activities that you previously enjoyed. Frequent crying spells. Talk with your health care provider if you think that you are experiencing any of these symptoms. General instructions See your health care provider for regular wellness exams and vaccines. This may include: Scheduling regular health, dental, and eye exams. Getting and maintaining your vaccines. These include: Influenza vaccine. Get this vaccine each year before the flu season begins. Pneumonia vaccine. Shingles vaccine. Tetanus, diphtheria, and pertussis (Tdap) booster vaccine. Your health care provider may also recommend other immunizations. Tell your health care provider if you have ever been abused or do not feel safe at home. Summary Menopause is a normal process in which your ability to get pregnant comes to an end. This condition causes hot flashes, night sweats, decreased interest in sex, mood swings, headaches, or lack  of sleep. Treatment for this condition may include hormone replacement therapy. Take actions to keep yourself healthy, including exercising regularly, eating a healthy diet, watching your weight, and checking your blood pressure and blood sugar levels. Get screened for cancer and depression. Make sure that you are up to date with all your vaccines. This information is not intended to replace advice given to you by your health care provider. Make sure you discuss any questions you have with your health care provider. Document Revised: 03/10/2021 Document Reviewed: 03/10/2021 [employer] Patient Education  2023 [employer]  

## 2023-02-12 NOTE — Progress Notes (Signed)
Subjective:    Patient ID: Rachel Nielsen, female    DOB: May 12, 1951, 72 y.o.   MRN: [id number]  HPI  Patient presents to clinic today for her annual exam.  Flu: 08/2020 Tetanus: > 10 years ago COVID: Never Pneumovax: Never Prevnar 20: 07/2021 Shingrix: Never Pap smear: Hysterectomy Mammogram: 04/2020 Bone density: 03/2009 Colon screening: 07/2018 Vision screening: annually Dentist: biannually  Diet: She does eat meat. She consumes fruits and veggies. She does eat some fried foods. She drinks mostly Dt. Soda, water, coffee Exercise: None  Review of Systems     Past Medical History:  Diagnosis Date   Allergy    Anxiety    Asthma    Colon polyps    Constipation    COPD (chronic obstructive pulmonary disease) (HCC)    Depression    GERD (gastroesophageal reflux disease)    Glaucoma    Heart attack (HCC)    Hypertension    Sleep apnea    Sleep apnea    Stroke [facility])    Urinary incontinence     Current Outpatient Medications  Medication Sig Dispense Refill   ACCU-CHEK GUIDE test strip Use to check blood sugar up to twice a day as directed 200 strip 2   Accu-Chek Softclix Lancets lancets Use to check blood sugar up to twice a day as directed 200 each 2   albuterol (PROVENTIL) (2.5 MG/3ML) 0.083% nebulizer solution Inhale 3 mLs (2.5 mg total) into the lungs every 4 (four) hours as needed for wheezing or shortness of breath. (Patient taking differently: Inhale 2.5 mg into the lungs every 6 (six) hours as needed for wheezing or shortness of breath.) 75 mL 12   albuterol (VENTOLIN HFA) 108 (90 Base) MCG/ACT inhaler Inhale 2-4 puffs by mouth every 4 hours as needed for wheezing, cough, and/or shortness of breath (Patient taking differently: 2 puffs every 6 (six) hours as needed.) 6.7 g 0   ALPRAZolam (XANAX) 1 MG tablet Take 1 mg by mouth 3 (three) times daily as needed for anxiety or sleep.     amLODipine (NORVASC) 5 MG tablet Take 1 tablet (5 mg total) by mouth daily. 90  tablet 0   Asenapine Maleate 10 MG SUBL Place 5 mg under the tongue at bedtime.     aspirin 81 MG EC tablet Take 1 tablet (81 mg total) by mouth daily. Swallow whole. 30 tablet 12   bisoprolol (ZEBETA) 10 MG tablet Take 1 tablet (10 mg total) by mouth daily. OFFICE VISIT NEEDED FOR ADDITIONAL REFILLS 90 tablet 0   buPROPion (WELLBUTRIN XL) 300 MG 24 hr tablet Take 1 tablet (300 mg total) by mouth daily. 30 tablet 0   busPIRone (BUSPAR) 15 MG tablet Take 15 mg by mouth 2 (two) times daily.     clopidogrel (PLAVIX) 75 MG tablet Take 75 mg by mouth daily.     docusate sodium (COLACE) 100 MG capsule Take 100 mg by mouth daily.     Dulaglutide 3 MG/0.5ML SOPN Inject 0.5 mLs (3 mg total) subcutaneously once a week     DULoxetine (CYMBALTA) 60 MG capsule Take 60 mg by mouth daily.     famotidine (PEPCID) 20 MG tablet Take 20 mg by mouth 2 (two) times daily.     FARXIGA 10 MG TABS tablet Take 10 mg by mouth daily.     fluticasone (FLONASE) 50 MCG/ACT nasal spray Place 1 spray into both nostrils daily. 16 g 0   fluticasone-salmeterol (ADVAIR) 250-50 MCG/ACT AEPB Inhale  1 puff into the lungs 2 (two) times daily.     gabapentin (NEURONTIN) 600 MG tablet Take 600-900 mg by mouth 3 (three) times daily. 600 mg AM, 600 mg pm and 900 mg at bedtime     GLOBAL EASE INJECT PEN NEEDLES 32G X 4 MM MISC Use as directed with insulin 100 each 0   Insulin Glargine Solostar (LANTUS) 100 UNIT/ML Solostar Pen Inject 20 Units into the skin at bedtime.     Insulin Pen Needle (BD PEN NEEDLE NANO U/F) 32G X 4 MM MISC Inject 1 Device into the skin daily. 100 each 3   Insulin Syringe-Needle U-100 (INSULIN SYRINGE 1CC/31GX5/16") 31G X 5/16" 1 ML MISC 1 Syringe by Does not apply route in the morning, at noon, in the evening, and at bedtime. 200 each 3   lactulose (CHRONULAC) 10 GM/15ML solution Take 30 g by mouth 2 (two) times daily.     linaclotide (LINZESS) 290 MCG CAPS capsule Take 1 capsule (290 mcg total) by mouth daily before  breakfast. 90 capsule 0   lurasidone (LATUDA) 40 MG TABS tablet Take 40 mg by mouth daily.     meloxicam (MOBIC) 15 MG tablet Take 15 mg by mouth daily. (Patient not taking: Reported on 12/25/2022)     metFORMIN (GLUCOPHAGE) 1000 MG tablet Take 1,000 mg by mouth 2 (two) times daily with a meal.     mirabegron ER (MYRBETRIQ) 50 MG TB24 tablet Take 1 tablet (50 mg total) by mouth daily. 30 tablet 11   montelukast (SINGULAIR) 10 MG tablet Take 1 tablet (10 mg total) by mouth daily. 30 tablet 0   pantoprazole (PROTONIX) 40 MG tablet Take 1 tablet (40 mg total) by mouth 2 (two) times daily. 180 tablet 1   predniSONE (DELTASONE) 5 MG tablet Take 5 mg by mouth daily. (Patient not taking: Reported on 12/25/2022)     pregabalin (LYRICA) 75 MG capsule Take 100 mg by mouth 3 (three) times daily.     rosuvastatin (CRESTOR) 10 MG tablet Take 1 tablet (10 mg total) by mouth daily. 90 tablet 0   TRAVATAN Z 0.004 % SOLN ophthalmic solution Place 1 drop into both eyes at bedtime. 5 mL 0   No current facility-administered medications for this visit.    Allergies  Allergen Reactions   Ace Inhibitors Cough    Family History  Problem Relation Age of Onset   Hypertension Mother    Atrial fibrillation Mother    Heart attack Mother    Hypertension Father    Stomach cancer Father    Diabetes Father    Liver cancer Father    Diabetes Sister    Hypertension Sister     Social History   Socioeconomic History   Marital status: Legally Separated    Spouse name: Not on file   Number of children: 4   Years of education: Not on file   Highest education level: Not on file  Occupational History   Occupation: retired  Tobacco Use   Smoking status: Former   Smokeless tobacco: Never   Tobacco comments:    Uses vape w/ nicotine  Vaping Use   Vaping Use: Every day   Substances: Nicotine, Flavoring  Substance and Sexual Activity   Alcohol use: Not Currently    Comment: monthly or less    Drug use: No    Sexual activity: Not on file  Other Topics Concern   Not on file  Social History Narrative   Not on file  Social Determinants of Health   Financial Resource Strain: Medium Risk (12/25/2022)   Overall Financial Resource Strain (CARDIA)    Difficulty of Paying Living Expenses: Somewhat hard  Food Insecurity: No Food Insecurity (12/25/2022)   Hunger Vital Sign    Worried About Running Out of Food in the Last Year: Never true    Ran Out of Food in the Last Year: Never true  Transportation Needs: No Transportation Needs (12/25/2022)   PRAPARE - Administrator, Civil Service (Medical): No    Lack of Transportation (Non-Medical): No  Physical Activity: Inactive (12/25/2022)   Exercise Vital Sign    Days of Exercise per Week: 0 days    Minutes of Exercise per Session: 0 min  Stress: No Stress Concern Present (12/25/2022)   [employer] of Occupational Health - Occupational Stress Questionnaire    Feeling of Stress : Only a little  Social Connections: Socially Isolated (12/25/2022)   Social Connection and Isolation Panel [NHANES]    Frequency of Communication with Friends and Family: More than three times a week    Frequency of Social Gatherings with Friends and Family: More than three times a week    Attends Religious Services: Never    Database administrator or Organizations: No    Attends Banker Meetings: Never    Marital Status: Separated  Intimate Partner Violence: Not At Risk (12/25/2022)   Humiliation, Afraid, Rape, and Kick questionnaire    Fear of Current or Ex-Partner: No    Emotionally Abused: No    Physically Abused: No    Sexually Abused: No     Constitutional: Denies fever, malaise, fatigue, headache or abrupt weight changes.  HEENT: Denies eye pain, eye redness, ear pain, ringing in the ears, wax buildup, runny nose, nasal congestion, bloody nose, or sore throat. Respiratory: Pt reports intermittent shortness of breath. Denies difficulty  breathing, cough or sputum production.   Cardiovascular: Denies chest pain, chest tightness, palpitations or swelling in the hands or feet.  Gastrointestinal: Patient reports intermittent constipation.  Denies abdominal pain, bloating, diarrhea or blood in the stool.  GU: Patient reports urinary frequency.  Denies urgency,  pain with urination, burning sensation, blood in urine, odor or discharge. Musculoskeletal: Patient reports chronic neck and back pain, right sided weakness, difficulty with gait.  Denies decrease in range of motion, muscle pain or joint swelling.  Skin: Denies redness, rashes, lesions or ulcercations.  Neurological: Patient reports neuropathic pain.  Denies dizziness, difficulty with memory, difficulty with speech or problems with balance and coordination.  Psych: Patient has a history of anxiety and depression.  Denies SI/HI.  No other specific complaints in a complete review of systems (except as listed in HPI above).  Objective:   Physical Exam  BP 126/84 (BP Location: Right Arm, Patient Position: Sitting, Cuff Size: Normal)   Pulse 82   Temp (!) 96.8 F (36 C) (Temporal)   Wt 207 lb (93.9 kg)   SpO2 98%   BMI 35.53 kg/m   Wt Readings from Last 3 Encounters:  12/25/22 211 lb (95.7 kg)  11/05/22 211 lb 12.8 oz (96.1 kg)  06/30/22 229 lb (103.9 kg)    General: Appears her stated age, obese, chronically ill-appearing in NAD. Skin: Warm, dry and intact. No ulcerations noted. HEENT: Head: normal shape and size; Eyes: sclera white, no icterus, conjunctiva pink, PERRLA and EOMs intact;  Neck:  Neck supple, trachea midline. No masses, lumps or thyromegaly present.  Cardiovascular: Normal rate  and rhythm. S1,S2 noted.  No murmur, rubs or gallops noted. No JVD or BLE edema. No carotid bruits noted. Pulmonary/Chest: Normal effort and positive vesicular breath sounds. No respiratory distress. No wheezes, rales or ronchi noted.  Abdomen: Soft and nontender. Normal bowel  sounds.  Musculoskeletal: Strength 5/5 LUE/LLE, 4/5 RUE/RLE. Gait slow and steady with use of walker. Neurological: Alert and oriented.  She does have some expressive aphasia.  Cranial nerves II-XII grossly intact. Coordination normal.  Psychiatric: Mood and affect mildly flat. Behavior is normal. Judgment and thought content normal.     BMET    Component Value Date/Time   NA 143 05/14/2022 0551   NA 141 10/19/2019 1323   K 4.3 05/14/2022 0551   CL 107 05/14/2022 0551   CO2 27 05/14/2022 0551   GLUCOSE 154 (H) 05/14/2022 0551   BUN 12 05/14/2022 0551   BUN 19 10/19/2019 1323   CREATININE 1.04 (H) 05/14/2022 0551   CREATININE 0.94 02/10/2022 1111   CALCIUM 9.1 05/14/2022 0551   GFRNONAA 58 (L) 05/14/2022 0551   GFRAA 59 (L) 08/03/2020 0637    Lipid Panel     Component Value Date/Time   CHOL 68 05/15/2022 0439   TRIG 59 05/15/2022 0439   HDL 32 (L) 05/15/2022 0439   CHOLHDL 2.1 05/15/2022 0439   VLDL 12 05/15/2022 0439   LDLCALC 24 05/15/2022 0439   LDLCALC 26 02/10/2022 1111    CBC    Component Value Date/Time   WBC 8.7 05/14/2022 0551   RBC 4.96 05/14/2022 0551   HGB 12.3 05/14/2022 0551   HCT 41.5 05/14/2022 0551   PLT 182 05/14/2022 0551   MCV 83.7 05/14/2022 0551   MCH 24.8 (L) 05/14/2022 0551   MCHC 29.6 (L) 05/14/2022 0551   RDW 15.9 (H) 05/14/2022 0551   LYMPHSABS 3.0 05/12/2022 1357   MONOABS 0.7 05/12/2022 1357   EOSABS 0.6 (H) 05/12/2022 1357   BASOSABS 0.0 05/12/2022 1357    Hgb A1C Lab Results  Component Value Date   HGBA1C 7.1 (A) 06/30/2022           Assessment & Plan:   Preventative Health Maintenance:  Encouraged her to get a flu shot in the fall She declines tetanus for financial reasons, advised if she gets bit or cut to get this done Prevnar 20 UTD, does not need Pneumovax Encouraged her to get her COVID-vaccine Discussed Shingrix vaccine, she will check coverage with her insurance company and schedule visit if she would like  to have this done No longer needs Pap smears Mammogram and bone density ordered-she will call to get this scheduled Colon screening UTD Encouraged her to consume a balanced diet and exercise regimen Advised her to see an eye doctor and dentist annually We will check CBC, c-Met, lipid, A1c  RTC in 6 months, follow-up chronic conditions

## 2023-02-12 NOTE — Chronic Care Management (AMB) (Signed)
Chronic Care Management   CCM RN Visit Note  02/12/2023 Name: Rachel Nielsen MRN: [id number] DOB: 07-25-1951  Subjective: Rachel Nielsen is a 72 y.o. year old female who is a primary care patient of [name], NP. The patient was referred to the Chronic Care Management team for assistance with care management needs subsequent to provider initiation of CCM services and plan of care.    Today's Visit:  Engaged with patient by telephone for follow up visit.        Goals Addressed             This Visit's Progress    CCM Expected Outcome:  Monitor, Self-Manage and Reduce Symptoms of Diabetes       Current Barriers:  Knowledge Deficits related to hypoglycemia and how to effectively manage low blood sugars Chronic Disease Management support and education needs related to effective management of DM  Lab Results  Component Value Date   HGBA1C 7.1 (A) 06/30/2022   10-24-2022 at [facility] Endocrinologist: 5.8. Has a POC one at the office that has not resulted in the chart yet.  Planned Interventions: Provided education to patient about basic DM disease process. The patient states that she feels like she is doing well with managing her DM health and well be. Likes the [employer] and it holds her accountable. She sees the endocrinologist coming up. Saw pcp today; Reviewed medications with patient and discussed importance of medication adherence. The patient is compliant with medications. Works closely with her endocrinologist. Education and review of dietary habits and insulin coverage;        Reviewed prescribed diet with patient heart healthy/ADA diet. Review of heart healthy/ADA diet and the patient states she is monitoring what she eats but sometimes her blood sugars are still up and down. She has multiple chronic conditions that she feels impacts her health and well being; Counseled on importance of regular laboratory monitoring as prescribed. Has regular lab work drawn.  Education and support given;        Discussed plans with patient for ongoing care management follow up and provided patient with direct contact information for care management team;      Provided patient with written educational materials related to hypo and hyperglycemia and importance of correct treatment. Education given and discussed keeping glucose tablets and snacks around in the event of low blood sugars. She states the lowest she has seen her blood sugar is 68 and her [name] goes off when it gets below 70 and she knows she needs to eat something. She says the hight she has seen is 246 and that is not happening often;       Reviewed scheduled/upcoming provider appointments including: 08-18-2023 at 1020 am     Advised patient, providing education and rationale, to check cbg three times daily and when you have symptoms of low or high blood sugar and record. Is checking more frequently since she had the hypoglycemic event at the end of November. Has seen the endocrinologist and has been given parameters to call and when to take insulin. She has the freestyle libre and her blood sugars have been on average 145. She states it was 153 this am and range has been 68 to 246. Education and support given.  call provider for findings outside established parameters. The patient has parameters to go by and knows when to call the endocrinologist for changes.;       Referral made to pharmacy team for assistance  with ongoing support and education for medication and management of medications effectively. Works with the pharm D on a regular basis for effective management of DM;       Review of patient status, including review of consultants reports, relevant laboratory and other test results, and medications completed;       Advised patient to discuss changes in DM, questions or concerns with provider;      Screening for signs and symptoms of depression related to chronic disease state;        Assessed social  determinant of health barriers;    Saw the eye doctor recently and her pressure was up and her tear ducts were blocked. She has been back a couple of times for evaluation and treatment.      The patient is seeing her psychiatrist on a regular basis. She states that overall she is feeling better. She denies any acute changes in her health and well being. She is thankful that things are stable. Encouraged the patient to reach out to specialist for changes in mood, anxiety, depression, or other mental health needs and concerns  Symptom Management: Take medications as prescribed   Attend all scheduled provider appointments Call provider office for new concerns or questions  call the Suicide and Crisis Lifeline: 988 call the [place] National Suicide Prevention Lifeline: (212)697-5760 or TTY: 504-233-7339 TTY 7037050141) to talk to a trained counselor call 1-800-273-TALK (toll free, 24 hour hotline) if experiencing a Mental Health or Behavioral Health Crisis  check feet daily for cuts, sores or redness trim toenails straight across manage portion size wash and dry feet carefully every day wear comfortable, cotton socks wear comfortable, well-fitting shoes  Follow Up Plan: Telephone follow up appointment with care management team member scheduled for: 04-30-2023 at 1 pm       CCM Expected Outcome:  Monitor, Self-Manage, and Reduce Symptoms of Hypertension       Current Barriers:  Knowledge Deficits related to normal blood pressure readings and the need to maintain control of blood pressure to prevent any adverse effects or increase risk of heart attack and stroke Chronic Disease Management support and education needs related to effective management of HTN BP Readings from Last 3 Encounters:  02/12/23 126/84  01/15/23 (!) 169/96  11/05/22 128/78     Planned Interventions: Evaluation of current treatment plan related to hypertension self management and patient's adherence to plan as  established by provider. The patients blood pressures are stable. The patient saw the pcp today. She denies any new concerns. The patient states that she has had a shot in her neck since the last outreach and the pain in her neck is so much better. Its down from an eight or nine to a three or four. The patient is happy this is helping her. It is helping her overall health and well being.   Provided education to patient re: stroke prevention, s/s of heart attack and stroke. The patient has had a stroke in the past. She knows the risk and has several specialist she sees on a regular basis; Reviewed prescribed diet heart healthy/ADA diet. Education provided. The patient tries to adhere to a heart healthy/ADA diet Reviewed medications with patient and discussed importance of compliance. The patient verbalized compliance with medications. Denies any medications concerns today. Works with pharm D on a regular basis. Has an upcoming appointment with the pharm D. Reminder provided today. No medication changes at her appointment today.;  Discussed plans with patient for ongoing care  management follow up and provided patient with direct contact information for care management team; Advised patient, providing education and rationale, to monitor blood pressure daily and record, calling PCP for findings outside established parameters;  Reviewed scheduled/upcoming provider appointments including: Saw pcp today. Sees specialist on a regular basis. Has follow up with the pcp on 08-18-2023 at 1020 am Advised patient to discuss changes in her HTN or heart health with provider; Provided education on prescribed diet heart healthy/ADA. Review and education provided;  Discussed complications of poorly controlled blood pressure such as heart disease, stroke, circulatory complications, vision complications, kidney impairment, sexual dysfunction;  Screening for signs and symptoms of depression related to chronic disease state;   Assessed social determinant of health barriers;  The patient has noticed changes in her hearing and feels she needs to have a hearing test in the new year.   Symptom Management: Take medications as prescribed   Attend all scheduled provider appointments Call provider office for new concerns or questions  call the Suicide and Crisis Lifeline: 988 call the [place] National Suicide Prevention Lifeline: (605)096-2068 or TTY: 660 004 3668 TTY 971-073-2489) to talk to a trained counselor call 1-800-273-TALK (toll free, 24 hour hotline) if experiencing a Mental Health or Behavioral Health Crisis  check blood pressure weekly learn about high blood pressure take blood pressure log to all doctor appointments call doctor for signs and symptoms of high blood pressure develop an action plan for high blood pressure keep all doctor appointments take medications for blood pressure exactly as prescribed report new symptoms to your doctor  Follow Up Plan: Telephone follow up appointment with care management team member scheduled for:04-30-2023 at 1 pm          Plan:Telephone follow up appointment with care management team member scheduled for:  04-30-2023 at 1 pm  Alto Denver RN, MSN, CCM RN Care Manager  Chronic Care Management Direct Number: [phone]

## 2023-02-13 LAB — CBC
HCT: 38.9 % (ref 35.0–45.0)
Hemoglobin: 11.9 g/dL (ref 11.7–15.5)
MCH: 23.6 pg — ABNORMAL LOW (ref 27.0–33.0)
MCHC: 30.6 g/dL — ABNORMAL LOW (ref 32.0–36.0)
MCV: 77 fL — ABNORMAL LOW (ref 80.0–100.0)
MPV: 12.4 fL (ref 7.5–12.5)
Platelets: 244 10*3/uL (ref 140–400)
RBC: 5.05 10*6/uL (ref 3.80–5.10)
RDW: 15.8 % — ABNORMAL HIGH (ref 11.0–15.0)
WBC: 10.1 10*3/uL (ref 3.8–10.8)

## 2023-02-13 LAB — LIPID PANEL
Cholesterol: 109 mg/dL (ref <200)
HDL: 54 mg/dL (ref >=50)
LDL Cholesterol (Calc): 41 mg/dL (calc)
Non-HDL Cholesterol (Calc): 55 mg/dL (calc) (ref <130)
Total CHOL/HDL Ratio: 2 (calc) (ref <5.0)
Triglycerides: 55 mg/dL (ref <150)

## 2023-02-13 LAB — COMPLETE METABOLIC PANEL WITH GFR
AG Ratio: 1.3 (calc) (ref 1.0–2.5)
ALT: 8 U/L (ref 6–29)
AST: 9 U/L — ABNORMAL LOW (ref 10–35)
Albumin: 3.8 g/dL (ref 3.6–5.1)
Alkaline phosphatase (APISO): 90 U/L (ref 37–153)
BUN: 19 mg/dL (ref 7–25)
CO2: 28 mmol/L (ref 20–32)
Calcium: 9.8 mg/dL (ref 8.6–10.4)
Chloride: 106 mmol/L (ref 98–110)
Creat: 0.9 mg/dL (ref 0.50–1.05)
Globulin: 3 g/dL (calc) (ref 1.9–3.7)
Glucose, Bld: 116 mg/dL — ABNORMAL HIGH (ref 65–99)
Potassium: 4.3 mmol/L (ref 3.5–5.3)
Sodium: 144 mmol/L (ref 135–146)
Total Bilirubin: 0.5 mg/dL (ref 0.2–1.2)
Total Protein: 6.8 g/dL (ref 6.1–8.1)
eGFR: 69 mL/min/{1.73_m2} (ref >=60)

## 2023-02-13 LAB — MICROALBUMIN / CREATININE URINE RATIO
Creatinine, Urine: 148 mg/dL (ref 20–275)
Microalb Creat Ratio: 6 mg/g creat (ref <30)
Microalb, Ur: 0.9 mg/dL

## 2023-02-13 LAB — HEMOGLOBIN A1C
Hgb A1c MFr Bld: 7.8 % of total Hgb — ABNORMAL HIGH (ref <5.7)
Mean Plasma Glucose: 177 mg/dL
eAG (mmol/L): 9.8 mmol/L

## 2023-02-15 ENCOUNTER — Other Ambulatory Visit: Payer: Self-pay | Admitting: Internal Medicine

## 2023-02-15 DIAGNOSIS — I10 Essential (primary) hypertension: Secondary | ICD-10-CM | POA: Diagnosis not present

## 2023-02-15 DIAGNOSIS — E1169 Type 2 diabetes mellitus with other specified complication: Secondary | ICD-10-CM | POA: Diagnosis not present

## 2023-02-15 DIAGNOSIS — Z794 Long term (current) use of insulin: Secondary | ICD-10-CM | POA: Diagnosis not present

## 2023-02-15 DIAGNOSIS — E785 Hyperlipidemia, unspecified: Secondary | ICD-10-CM | POA: Diagnosis not present

## 2023-02-15 DIAGNOSIS — E1142 Type 2 diabetes mellitus with diabetic polyneuropathy: Secondary | ICD-10-CM | POA: Diagnosis not present

## 2023-02-15 DIAGNOSIS — E1159 Type 2 diabetes mellitus with other circulatory complications: Secondary | ICD-10-CM | POA: Diagnosis not present

## 2023-02-16 NOTE — Telephone Encounter (Signed)
Requested Prescriptions  Pending Prescriptions Disp Refills   fluticasone (FLONASE) 50 MCG/ACT nasal spray [Pharmacy Med Name: FLUTICASONE PROP 50 MCG SPRAY] 48 g 1    Sig: Place 1 spray into both nostrils daily.     Ear, Nose, and Throat: Nasal Preparations - Corticosteroids Passed - 02/15/2023  7:54 AM      Passed - Valid encounter within last 12 months    Recent Outpatient Visits           4 days ago Encounter for general adult medical examination with abnormal findings   [place] [facility] [place], [place] W, NP   7 months ago Uncontrolled type 2 diabetes mellitus with hyperglycemia [facility])   [place] [facility] [place], Salvadore Oxford, NP   1 year ago Encounter for general adult medical examination with abnormal findings   [place] [facility] [place], Salvadore Oxford, NP   1 year ago COPD exacerbation [facility])   [place] [facility] [place], Salvadore Oxford, NP   1 year ago Mixed diabetic hyperlipidemia associated with type 2 diabetes mellitus [facility])   [place] [facility] [place], Salvadore Oxford, NP       Future Appointments             In 6 months [place], Salvadore Oxford, NP  [facility], [facility]

## 2023-03-02 DIAGNOSIS — I10 Essential (primary) hypertension: Secondary | ICD-10-CM | POA: Diagnosis not present

## 2023-03-02 DIAGNOSIS — E1159 Type 2 diabetes mellitus with other circulatory complications: Secondary | ICD-10-CM | POA: Diagnosis not present

## 2023-03-02 DIAGNOSIS — Z794 Long term (current) use of insulin: Secondary | ICD-10-CM

## 2023-03-15 ENCOUNTER — Telehealth: Payer: Medicare HMO

## 2023-03-15 ENCOUNTER — Telehealth: Payer: Self-pay | Admitting: Pharmacist

## 2023-03-15 NOTE — Telephone Encounter (Signed)
   Outreach Note  03/15/2023 Name: Rachel Nielsen MRN: [id number] DOB: 1951/07/31  Referred by: [name], NP Reason for referral : No chief complaint on file.   Was unable to reach patient via telephone today and unable to leave a message as patient's voicemail box is full   Follow Up Plan: Will attempt to reach patient by telephone again within the next 60 days  Estelle Grumbles, PharmD, [facility] Clinical Pharmacist [facility] [phone]

## 2023-03-20 ENCOUNTER — Other Ambulatory Visit: Payer: Self-pay | Admitting: Internal Medicine

## 2023-03-20 DIAGNOSIS — E1165 Type 2 diabetes mellitus with hyperglycemia: Secondary | ICD-10-CM

## 2023-03-22 NOTE — Telephone Encounter (Signed)
Requested Prescriptions  Pending Prescriptions Disp Refills   rosuvastatin (CRESTOR) 10 MG tablet [Pharmacy Med Name: ROSUVASTATIN CALCIUM 10 MG TAB] 90 tablet 0    Sig: Take 1 tablet (10 mg total) by mouth daily.     Cardiovascular:  Antilipid - Statins 2 Failed - 03/20/2023  9:19 AM      Failed - Lipid Panel in normal range within the last 12 months    Cholesterol  Date Value Ref Range Status  02/12/2023 109 <200 mg/dL Final   LDL Cholesterol (Calc)  Date Value Ref Range Status  02/12/2023 41 mg/dL (calc) Final    Comment:    Reference range: <100 . Desirable range <100 mg/dL for primary prevention;   <70 mg/dL for patients with CHD or diabetic patients  with > or = 2 CHD risk factors. Marland Kitchen LDL-C is now calculated using the Martin-Hopkins  calculation, which is a validated novel method providing  better accuracy than the Friedewald equation in the  estimation of LDL-C.  Horald Pollen et al. Lenox Ahr. 5784;696(29): 2061-2068  (http://education.[employer].com/faq/FAQ164)    HDL  Date Value Ref Range Status  02/12/2023 54 > OR = 50 mg/dL Final   Triglycerides  Date Value Ref Range Status  02/12/2023 55 <150 mg/dL Final         Passed - Cr in normal range and within 360 days    Creat  Date Value Ref Range Status  02/12/2023 0.90 0.50 - 1.05 mg/dL Final   Creatinine, Urine  Date Value Ref Range Status  02/12/2023 148 20 - 275 mg/dL Final         Passed - Patient is not pregnant      Passed - Valid encounter within last 12 months    Recent Outpatient Visits           1 month ago Encounter for general adult medical examination with abnormal findings   [place] [facility] [place], [place] W, NP   8 months ago Uncontrolled type 2 diabetes mellitus with hyperglycemia [facility])   [place] [facility] [place], Salvadore Oxford, NP   1 year ago Encounter for general adult medical examination with abnormal findings   [place] [facility] [place], Salvadore Oxford, NP   1 year ago COPD exacerbation [facility])   [place] [facility] [place], Salvadore Oxford, NP   1 year ago Mixed diabetic hyperlipidemia associated with type 2 diabetes mellitus [facility])   [place] [facility] [place], Salvadore Oxford, NP       Future Appointments             In 4 months Baity, Salvadore Oxford, NP [place] [facility], [facility]

## 2023-03-24 DIAGNOSIS — E1169 Type 2 diabetes mellitus with other specified complication: Secondary | ICD-10-CM | POA: Diagnosis not present

## 2023-04-01 ENCOUNTER — Other Ambulatory Visit: Payer: Self-pay | Admitting: Internal Medicine

## 2023-04-01 NOTE — Telephone Encounter (Signed)
Requested Prescriptions  Pending Prescriptions Disp Refills   amLODipine (NORVASC) 5 MG tablet [Pharmacy Med Name: AMLODIPINE BESYLATE 5 MG TAB] 90 tablet 0    Sig: Take 1 tablet (5 mg total) by mouth daily.     Cardiovascular: Calcium Channel Blockers 2 Passed - 04/01/2023  7:53 AM      Passed - Last BP in normal range    BP Readings from Last 1 Encounters:  02/12/23 126/84         Passed - Last Heart Rate in normal range    Pulse Readings from Last 1 Encounters:  02/12/23 82         Passed - Valid encounter within last 6 months    Recent Outpatient Visits           1 month ago Encounter for general adult medical examination with abnormal findings   [place] [facility] [place], [place] W, NP   9 months ago Uncontrolled type 2 diabetes mellitus with hyperglycemia [facility]   [facility] [place], Salvadore Oxford, NP   1 year ago Encounter for general adult medical examination with abnormal findings   [place] [facility] [place], Salvadore Oxford, NP   1 year ago COPD exacerbation [facility])   [place] [facility] [place], Salvadore Oxford, NP   1 year ago Mixed diabetic hyperlipidemia associated with type 2 diabetes mellitus [facility])   [place] [facility] [place], Salvadore Oxford, NP       Future Appointments             In 4 months Baity, Salvadore Oxford, NP  [facility], [facility]

## 2023-04-10 ENCOUNTER — Other Ambulatory Visit: Payer: Self-pay | Admitting: Internal Medicine

## 2023-04-10 DIAGNOSIS — K5900 Constipation, unspecified: Secondary | ICD-10-CM

## 2023-04-12 NOTE — Telephone Encounter (Signed)
Requested Prescriptions  Pending Prescriptions Disp Refills   LINZESS 290 MCG CAPS capsule [Pharmacy Med Name: Karlene Einstein 290 MCG CAPSULE] 90 capsule 0    Sig: Take 1 capsule (290 mcg total) by mouth daily before breakfast.     Gastroenterology: Irritable Bowel Syndrome Passed - 04/10/2023  1:42 PM      Passed - Valid encounter within last 12 months    Recent Outpatient Visits           1 month ago Encounter for general adult medical examination with abnormal findings   [place] [facility] [place], [place], NP   9 months ago Uncontrolled type 2 diabetes mellitus with hyperglycemia [facility])   [place] [facility] [place], Salvadore Oxford, NP   1 year ago Encounter for general adult medical examination with abnormal findings   [place] [facility] [place], Salvadore Oxford, NP   1 year ago COPD exacerbation [facility])   [facility] [place], Salvadore Oxford, NP   1 year ago Mixed diabetic hyperlipidemia associated with type 2 diabetes mellitus [facility]   [place] [facility] [place], Salvadore Oxford, NP       Future Appointments             In 4 months Baity, Salvadore Oxford, NP  [facility], [facility]

## 2023-04-15 DIAGNOSIS — E1142 Type 2 diabetes mellitus with diabetic polyneuropathy: Secondary | ICD-10-CM | POA: Diagnosis not present

## 2023-04-15 DIAGNOSIS — B351 Tinea unguium: Secondary | ICD-10-CM | POA: Diagnosis not present

## 2023-04-15 DIAGNOSIS — M79674 Pain in right toe(s): Secondary | ICD-10-CM | POA: Diagnosis not present

## 2023-04-15 DIAGNOSIS — M79675 Pain in left toe(s): Secondary | ICD-10-CM | POA: Diagnosis not present

## 2023-04-24 ENCOUNTER — Other Ambulatory Visit: Payer: Self-pay | Admitting: Internal Medicine

## 2023-04-24 DIAGNOSIS — I10 Essential (primary) hypertension: Secondary | ICD-10-CM

## 2023-04-26 NOTE — Telephone Encounter (Signed)
Requested Prescriptions  Pending Prescriptions Disp Refills   bisoprolol (ZEBETA) 10 MG tablet [Pharmacy Med Name: BISOPROLOL FUMARATE 10 MG TAB] 90 tablet 1    Sig: Take 1 tablet (10 mg total) by mouth daily. OFFICE VISIT NEEDED FOR ADDITIONAL REFILLS     Cardiovascular: Beta Blockers 2 Passed - 04/24/2023 12:39 PM      Passed - Cr in normal range and within 360 days    Creat  Date Value Ref Range Status  02/12/2023 0.90 0.50 - 1.05 mg/dL Final   Creatinine, Urine  Date Value Ref Range Status  02/12/2023 148 20 - 275 mg/dL Final         Passed - Last BP in normal range    BP Readings from Last 1 Encounters:  02/12/23 126/84         Passed - Last Heart Rate in normal range    Pulse Readings from Last 1 Encounters:  02/12/23 82         Passed - Valid encounter within last 6 months    Recent Outpatient Visits           2 months ago Encounter for general adult medical examination with abnormal findings   [place] [facility] [place], [place], NP   10 months ago Uncontrolled type 2 diabetes mellitus with hyperglycemia [facility])   [place] [facility] [place], Salvadore Oxford, NP   1 year ago Encounter for general adult medical examination with abnormal findings   [place] [facility] [place], Salvadore Oxford, NP   1 year ago COPD exacerbation [facility])   [facility] [place], [place] W, NP   1 year ago Mixed diabetic hyperlipidemia associated with type 2 diabetes mellitus [facility])   [place] [facility] [place], Salvadore Oxford, NP       Future Appointments             In 3 months Baity, Salvadore Oxford, NP [place] [facility], [facility]

## 2023-04-30 ENCOUNTER — Ambulatory Visit (INDEPENDENT_AMBULATORY_CARE_PROVIDER_SITE_OTHER): Payer: Medicare HMO

## 2023-04-30 ENCOUNTER — Telehealth: Payer: Medicare HMO

## 2023-04-30 DIAGNOSIS — E1165 Type 2 diabetes mellitus with hyperglycemia: Secondary | ICD-10-CM

## 2023-04-30 DIAGNOSIS — R269 Unspecified abnormalities of gait and mobility: Secondary | ICD-10-CM

## 2023-04-30 DIAGNOSIS — I10 Essential (primary) hypertension: Secondary | ICD-10-CM

## 2023-04-30 NOTE — Chronic Care Management (AMB) (Signed)
Chronic Care Management   CCM RN Visit Note  04/30/2023 Name: Rachel Nielsen MRN: [id number] DOB: 1951-02-22  Subjective: Rachel Nielsen is a 72 y.o. year old female who is a primary care patient of [name], NP. The patient was referred to the Chronic Care Management team for assistance with care management needs subsequent to provider initiation of CCM services and plan of care.    Today's Visit:  Engaged with patient by telephone for follow up visit.        Goals Addressed             This Visit's Progress    CCM Expected Outcome:  Monitor, Self-Manage and Reduce Symptoms of Diabetes       Current Barriers:  Knowledge Deficits related to hypoglycemia and how to effectively manage low blood sugars Chronic Disease Management support and education needs related to effective management of DM  Lab Results  Component Value Date   HGBA1C 7.8 (H) 02/12/2023   10[facility]24-2022 at [facility] Endocrinologist: 5.8. At the Endocrinologist recently the patient states it was 7.0, Provided education to patient about basic DM disease process. The patient states that she feels like she is doing well with managing her DM health and well be. Likes the [employer] and it holds her accountable. Sees the endocrinologist on a regular basis and states that she is not having to take very much insulin at all.; Reviewed medications with patient and discussed importance of medication adherence. The patient is compliant with medications. Works closely with her endocrinologist. Education and review of dietary habits and insulin coverage. Works with the pharm D on a regular basis. The patient states that she is not having to use much insulin at all now. ;        Reviewed prescribed diet with patient heart healthy/ADA diet. Review of heart healthy/ADA diet and the patient states she is monitoring what she eats but sometimes her blood sugars are still up and down. She has multiple chronic conditions that she  feels impacts her health and well being; Counseled on importance of regular laboratory monitoring as prescribed. Has regular lab work drawn. Education and support given;        Discussed plans with patient for ongoing care management follow up and provided patient with direct contact information for care management team;      Provided patient with written educational materials related to hypo and hyperglycemia and importance of correct treatment. Education given and discussed keeping glucose tablets and snacks around in the event of low blood sugars. She states the lowest she has seen her blood sugar is 89 and her [name] goes off when it gets below 70 and she knows she needs to eat something. She says the hight she has seen is 200 and that is not happening often;       Reviewed scheduled/upcoming provider appointments including: 08-18-2023 at 1020 am     Advised patient, providing education and rationale, to check cbg three times daily and when you have symptoms of low or high blood sugar and record. Is checking more frequently since she had the hypoglycemic event at the end of November. Has seen the endocrinologist and has been given parameters to call and when to take insulin. She has the freestyle libre and her blood sugars have been on average 120's. She feels her next A1C will be below 7.0.  call provider for findings outside established parameters. The patient has parameters to go by and knows  when to call the endocrinologist for changes.;       Referral made to pharmacy team for assistance with ongoing support and education for medication and management of medications effectively. Works with the pharm D on a regular basis for effective management of DM;       Review of patient status, including review of consultants reports, relevant laboratory and other test results, and medications completed;       Advised patient to discuss changes in DM, questions or concerns with provider;      Screening for  signs and symptoms of depression related to chronic disease state;        Assessed social determinant of health barriers;    Saw the eye doctor recently and her pressure was up and her tear ducts were blocked. She has been back a couple of times for evaluation and treatment.      The patient is seeing her psychiatrist on a regular basis. She states that overall she is feeling better. She denies any acute changes in her health and well being. She is thankful that things are stable. Encouraged the patient to reach out to specialist for changes in mood, anxiety, depression, or other mental health needs and concerns  Symptom Management: Take medications as prescribed   Attend all scheduled provider appointments Call provider office for new concerns or questions  call the Suicide and Crisis Lifeline: 988 call the [place] National Suicide Prevention Lifeline: 613-587-5102 or TTY: (647) 624-3509 TTY 9054883650) to talk to a trained counselor call 1-800-273-TALK (toll free, 24 hour hotline) if experiencing a Mental Health or Behavioral Health Crisis  check feet daily for cuts, sores or redness trim toenails straight across manage portion size wash and dry feet carefully every day wear comfortable, cotton socks wear comfortable, well-fitting shoes  Follow Up Plan: Telephone follow up appointment with care management team member scheduled for: 06-18-2023 at 1 pm       CCM Expected Outcome:  Monitor, Self-Manage, and Reduce Symptoms of Hypertension       Current Barriers:  Knowledge Deficits related to normal blood pressure readings and the need to maintain control of blood pressure to prevent any adverse effects or increase risk of heart attack and stroke Chronic Disease Management support and education needs related to effective management of HTN BP Readings from Last 3 Encounters:  02/12/23 126/84  01/15/23 (!) 169/96  11/05/22 128/78     Planned Interventions: Evaluation of current treatment  plan related to hypertension self management and patient's adherence to plan as established by provider. The patients blood pressures are stable. She has been a little stressed more than usual as her washing machine broke and she had to get another one. She states she is okay she just easily gets stressed and doesn't like to make bills. Reflective listening and support given.  Provided education to patient re: stroke prevention, s/s of heart attack and stroke. The patient has had a stroke in the past. She knows the risk and has several specialist she sees on a regular basis; Reviewed prescribed diet heart healthy/ADA diet. Education provided. The patient tries to adhere to a heart healthy/ADA diet Reviewed medications with patient and discussed importance of compliance. The patient verbalized compliance with medications. Denies any medications concerns today. Works with pharm D on a regular basis. Has an upcoming appointment with the pharm D. Reminder provided today. No medication changes at her appointment today.;  Discussed plans with patient for ongoing care management follow up and provided  patient with direct contact information for care management team; Advised patient, providing education and rationale, to monitor blood pressure daily and record, calling PCP for findings outside established parameters;  Reviewed scheduled/upcoming provider appointments including: Saw pcp today. Sees specialist on a regular basis. Has follow up with the pcp on 08-18-2023 at 1020 am Advised patient to discuss changes in her HTN or heart health with provider; Provided education on prescribed diet heart healthy/ADA. Review and education provided;  Discussed complications of poorly controlled blood pressure such as heart disease, stroke, circulatory complications, vision complications, kidney impairment, sexual dysfunction;  Screening for signs and symptoms of depression related to chronic disease state;  Assessed  social determinant of health barriers;  The patient has noticed changes in her hearing and feels she needs to have a hearing test in the new year.  The patient ask for assistance with getting a new rolling walker. The patient states that she has one but the screws are falling out and it is broken in places. Have sent an inbasket message to the pcp asking for assistance. Will follow up accordingly.   Symptom Management: Take medications as prescribed   Attend all scheduled provider appointments Call provider office for new concerns or questions  call the Suicide and Crisis Lifeline: 988 call the [place] National Suicide Prevention Lifeline: 254-633-2593 or TTY: 551-382-3066 TTY 954-457-1441) to talk to a trained counselor call 1-800-273-TALK (toll free, 24 hour hotline) if experiencing a Mental Health or Behavioral Health Crisis  check blood pressure weekly learn about high blood pressure take blood pressure log to all doctor appointments call doctor for signs and symptoms of high blood pressure develop an action plan for high blood pressure keep all doctor appointments take medications for blood pressure exactly as prescribed report new symptoms to your doctor  Follow Up Plan: Telephone follow up appointment with care management team member scheduled for:06-18-2023 at 1 pm          Plan:Telephone follow up appointment with care management team member scheduled for:  06-18-2023 at 1 pm  Alto Denver RN, MSN, CCM RN Care Manager  Chronic Care Management Direct Number: [phone]

## 2023-04-30 NOTE — Patient Instructions (Signed)
Please call the care guide team at [phone] if you need to cancel or reschedule your appointment.   If you are experiencing a Mental Health or Behavioral Health Crisis or need someone to talk to, please call the Suicide and Crisis Lifeline: 988 call the [place] National Suicide Prevention Lifeline: (737)451-2374 or TTY: 318-196-1607 TTY (858)214-5335) to talk to a trained counselor call 1-800-273-TALK (toll free, 24 hour hotline)   Following is a copy of the CCM Program Consent:  CCM service includes personalized support from designated clinical staff supervised by the physician, including individualized plan of care and coordination with other care providers 24/7 contact phone numbers for assistance for urgent and routine care needs. Service will only be billed when office clinical staff spend 20 minutes or more in a month to coordinate care. Only one practitioner may furnish and bill the service in a calendar month. The patient may stop CCM services at amy time (effective at the end of the month) by phone call to the office staff. The patient will be responsible for cost sharing (co-pay) or up to 20% of the service fee (after annual deductible is met)  Following is a copy of your full provider care plan:   Goals Addressed             This Visit's Progress    CCM Expected Outcome:  Monitor, Self-Manage and Reduce Symptoms of Diabetes       Current Barriers:  Knowledge Deficits related to hypoglycemia and how to effectively manage low blood sugars Chronic Disease Management support and education needs related to effective management of DM  Lab Results  Component Value Date   HGBA1C 7.8 (H) 02/12/2023   10-24-2022 at [facility] Endocrinologist: 5.8. At the Endocrinologist recently the patient states it was 7.0, Provided education to patient about basic DM disease process. The patient states that she feels like she is doing well with managing her DM health and well be. Likes the  [employer] and it holds her accountable. Sees the endocrinologist on a regular basis and states that she is not having to take very much insulin at all.; Reviewed medications with patient and discussed importance of medication adherence. The patient is compliant with medications. Works closely with her endocrinologist. Education and review of dietary habits and insulin coverage. Works with the pharm D on a regular basis. The patient states that she is not having to use much insulin at all now. ;        Reviewed prescribed diet with patient heart healthy/ADA diet. Review of heart healthy/ADA diet and the patient states she is monitoring what she eats but sometimes her blood sugars are still up and down. She has multiple chronic conditions that she feels impacts her health and well being; Counseled on importance of regular laboratory monitoring as prescribed. Has regular lab work drawn. Education and support given;        Discussed plans with patient for ongoing care management follow up and provided patient with direct contact information for care management team;      Provided patient with written educational materials related to hypo and hyperglycemia and importance of correct treatment. Education given and discussed keeping glucose tablets and snacks around in the event of low blood sugars. She states the lowest she has seen her blood sugar is 89 and her [name] goes off when it gets below 70 and she knows she needs to eat something. She says the hight she has seen is 200 and that is  not happening often;       Reviewed scheduled/upcoming provider appointments including: 08-18-2023 at 1020 am     Advised patient, providing education and rationale, to check cbg three times daily and when you have symptoms of low or high blood sugar and record. Is checking more frequently since she had the hypoglycemic event at the end of November. Has seen the endocrinologist and has been given parameters to call and when  to take insulin. She has the freestyle libre and her blood sugars have been on average 120's. She feels her next A1C will be below 7.0.  call provider for findings outside established parameters. The patient has parameters to go by and knows when to call the endocrinologist for changes.;       Referral made to pharmacy team for assistance with ongoing support and education for medication and management of medications effectively. Works with the pharm D on a regular basis for effective management of DM;       Review of patient status, including review of consultants reports, relevant laboratory and other test results, and medications completed;       Advised patient to discuss changes in DM, questions or concerns with provider;      Screening for signs and symptoms of depression related to chronic disease state;        Assessed social determinant of health barriers;    Saw the eye doctor recently and her pressure was up and her tear ducts were blocked. She has been back a couple of times for evaluation and treatment.      The patient is seeing her psychiatrist on a regular basis. She states that overall she is feeling better. She denies any acute changes in her health and well being. She is thankful that things are stable. Encouraged the patient to reach out to specialist for changes in mood, anxiety, depression, or other mental health needs and concerns  Symptom Management: Take medications as prescribed   Attend all scheduled provider appointments Call provider office for new concerns or questions  call the Suicide and Crisis Lifeline: 988 call the [place] National Suicide Prevention Lifeline: 551-686-2631 or TTY: 858 701 7883 TTY 802 615 0533) to talk to a trained counselor call 1-800-273-TALK (toll free, 24 hour hotline) if experiencing a Mental Health or Behavioral Health Crisis  check feet daily for cuts, sores or redness trim toenails straight across manage portion size wash and dry feet  carefully every day wear comfortable, cotton socks wear comfortable, well-fitting shoes  Follow Up Plan: Telephone follow up appointment with care management team member scheduled for: 06-18-2023 at 1 pm       CCM Expected Outcome:  Monitor, Self-Manage, and Reduce Symptoms of Hypertension       Current Barriers:  Knowledge Deficits related to normal blood pressure readings and the need to maintain control of blood pressure to prevent any adverse effects or increase risk of heart attack and stroke Chronic Disease Management support and education needs related to effective management of HTN BP Readings from Last 3 Encounters:  02/12/23 126/84  01/15/23 (!) 169/96  11/05/22 128/78     Planned Interventions: Evaluation of current treatment plan related to hypertension self management and patient's adherence to plan as established by provider. The patients blood pressures are stable. She has been a little stressed more than usual as her washing machine broke and she had to get another one. She states she is okay she just easily gets stressed and doesn't like to make bills. Reflective  listening and support given.  Provided education to patient re: stroke prevention, s/s of heart attack and stroke. The patient has had a stroke in the past. She knows the risk and has several specialist she sees on a regular basis; Reviewed prescribed diet heart healthy/ADA diet. Education provided. The patient tries to adhere to a heart healthy/ADA diet Reviewed medications with patient and discussed importance of compliance. The patient verbalized compliance with medications. Denies any medications concerns today. Works with pharm D on a regular basis. Has an upcoming appointment with the pharm D. Reminder provided today. No medication changes at her appointment today.;  Discussed plans with patient for ongoing care management follow up and provided patient with direct contact information for care management  team; Advised patient, providing education and rationale, to monitor blood pressure daily and record, calling PCP for findings outside established parameters;  Reviewed scheduled/upcoming provider appointments including: Saw pcp today. Sees specialist on a regular basis. Has follow up with the pcp on 08-18-2023 at 1020 am Advised patient to discuss changes in her HTN or heart health with provider; Provided education on prescribed diet heart healthy/ADA. Review and education provided;  Discussed complications of poorly controlled blood pressure such as heart disease, stroke, circulatory complications, vision complications, kidney impairment, sexual dysfunction;  Screening for signs and symptoms of depression related to chronic disease state;  Assessed social determinant of health barriers;  The patient has noticed changes in her hearing and feels she needs to have a hearing test in the new year.  The patient ask for assistance with getting a new rolling walker. The patient states that she has one but the screws are falling out and it is broken in places. Have sent an inbasket message to the pcp asking for assistance. Will follow up accordingly.   Symptom Management: Take medications as prescribed   Attend all scheduled provider appointments Call provider office for new concerns or questions  call the Suicide and Crisis Lifeline: 988 call the [place] National Suicide Prevention Lifeline: 854-460-7993 or TTY: 906-822-0584 TTY 6676999183) to talk to a trained counselor call 1-800-273-TALK (toll free, 24 hour hotline) if experiencing a Mental Health or Behavioral Health Crisis  check blood pressure weekly learn about high blood pressure take blood pressure log to all doctor appointments call doctor for signs and symptoms of high blood pressure develop an action plan for high blood pressure keep all doctor appointments take medications for blood pressure exactly as prescribed report new symptoms  to your doctor  Follow Up Plan: Telephone follow up appointment with care management team member scheduled for:06-18-2023 at 1 pm          Patient verbalizes understanding of instructions and care plan provided today and agrees to view in MyChart. Active MyChart status and patient understanding of how to access instructions and care plan via MyChart confirmed with patient.  Telephone follow up appointment with care management team member scheduled for: 06-18-2023 at 1 pm

## 2023-05-02 DIAGNOSIS — E1159 Type 2 diabetes mellitus with other circulatory complications: Secondary | ICD-10-CM | POA: Diagnosis not present

## 2023-05-02 DIAGNOSIS — Z794 Long term (current) use of insulin: Secondary | ICD-10-CM | POA: Diagnosis not present

## 2023-05-02 DIAGNOSIS — I10 Essential (primary) hypertension: Secondary | ICD-10-CM | POA: Diagnosis not present

## 2023-05-03 NOTE — Addendum Note (Signed)
Addended by: [name] on: 05/03/2023 08:54 AM   Modules accepted: Orders

## 2023-05-05 ENCOUNTER — Telehealth: Payer: Medicare HMO

## 2023-05-05 ENCOUNTER — Other Ambulatory Visit: Payer: Self-pay | Admitting: Neurology

## 2023-05-05 DIAGNOSIS — I639 Cerebral infarction, unspecified: Secondary | ICD-10-CM

## 2023-05-10 ENCOUNTER — Encounter: Payer: Self-pay | Admitting: Neurology

## 2023-05-13 ENCOUNTER — Ambulatory Visit
Admission: RE | Admit: 2023-05-13 | Discharge: 2023-05-13 | Disposition: A | Discharge status: Home / Self Care | Payer: Medicare HMO | Source: Ambulatory Visit | Attending: Neurology | Admitting: Neurology

## 2023-05-13 DIAGNOSIS — I639 Cerebral infarction, unspecified: Secondary | ICD-10-CM

## 2023-05-14 ENCOUNTER — Other Ambulatory Visit: Payer: Medicare HMO

## 2023-05-20 ENCOUNTER — Ambulatory Visit
Admission: RE | Admit: 2023-05-20 | Discharge: 2023-05-20 | Disposition: A | Discharge status: Home / Self Care | Payer: Medicare HMO | Source: Ambulatory Visit | Attending: Neurology | Admitting: Neurology

## 2023-05-20 DIAGNOSIS — I639 Cerebral infarction, unspecified: Secondary | ICD-10-CM | POA: Diagnosis not present

## 2023-06-01 DIAGNOSIS — E1169 Type 2 diabetes mellitus with other specified complication: Secondary | ICD-10-CM | POA: Diagnosis not present

## 2023-06-14 ENCOUNTER — Telehealth: Payer: Self-pay | Admitting: Pharmacist

## 2023-06-14 ENCOUNTER — Telehealth: Payer: Medicare HMO

## 2023-06-14 NOTE — Telephone Encounter (Signed)
   Outreach Note  06/14/2023 Name: MICHEALLA EARNHARDT MRN: [id number] DOB: 12/07/1950  Referred by: [name], NP  Outreach to patient today by telephone for follow up. Patient informs that she has switched her PCP care to [facility]. Shares decided to make this change as the rest of her providers are at [facility].  Plan:  Will send PCP and Nurse Care manager message to let team know that patient has switched to [facility]   Follow Up Plan: No further follow up needed  Estelle Grumbles, PharmD, [facility] Clinical Pharmacist [facility] [phone]

## 2023-06-16 ENCOUNTER — Other Ambulatory Visit: Payer: Self-pay | Admitting: Internal Medicine

## 2023-06-17 ENCOUNTER — Other Ambulatory Visit: Payer: Self-pay | Admitting: Internal Medicine

## 2023-06-17 DIAGNOSIS — E1165 Type 2 diabetes mellitus with hyperglycemia: Secondary | ICD-10-CM

## 2023-06-18 ENCOUNTER — Telehealth: Payer: Medicare HMO

## 2023-06-18 NOTE — Telephone Encounter (Signed)
Requested medications are due for refill today.  yes  Requested medications are on the active medications list.  yes  Last refill. 03/22/2023 #90 0 rf  Future visit scheduled.   Yes - but also has an appt as NP with [facility].  Notes to clinic.  No pcp listed.    Requested Prescriptions  Pending Prescriptions Disp Refills   rosuvastatin (CRESTOR) 10 MG tablet [Pharmacy Med Name: ROSUVASTATIN CALCIUM 10 MG TAB] 90 tablet 0    Sig: Take 1 tablet (10 mg total) by mouth daily.     Cardiovascular:  Antilipid - Statins 2 Failed - 06/17/2023  8:04 AM      Failed - Lipid Panel in normal range within the last 12 months    Cholesterol  Date Value Ref Range Status  02/12/2023 109 <200 mg/dL Final   LDL Cholesterol (Calc)  Date Value Ref Range Status  02/12/2023 41 mg/dL (calc) Final    Comment:    Reference range: <100 . Desirable range <100 mg/dL for primary prevention;   <70 mg/dL for patients with CHD or diabetic patients  with > or = 2 CHD risk factors. Marland Kitchen LDL-C is now calculated using the Martin-Hopkins  calculation, which is a validated novel method providing  better accuracy than the Friedewald equation in the  estimation of LDL-C.  Horald Pollen et al. Lenox Ahr. 4696;295(28): 2061-2068  (http://education.[facility].com/faq/FAQ164)    HDL  Date Value Ref Range Status  02/12/2023 54 > OR = 50 mg/dL Final   Triglycerides  Date Value Ref Range Status  02/12/2023 55 <150 mg/dL Final         Passed - Cr in normal range and within 360 days    Creat  Date Value Ref Range Status  02/12/2023 0.90 0.50 - 1.05 mg/dL Final   Creatinine, Urine  Date Value Ref Range Status  02/12/2023 148 20 - 275 mg/dL Final         Passed - Patient is not pregnant      Passed - Valid encounter within last 12 months    Recent Outpatient Visits           4 months ago Encounter for general adult medical examination with abnormal findings   [place] [facility] [place],  [place], NP   11 months ago Uncontrolled type 2 diabetes mellitus with hyperglycemia [facility])    [place] [facility] [place], Salvad[place] Oxford, NP   1 year ago Encounter for general adult medical examination with abnormal findings   [place] [facility] [place], Salvadore Oxford, NP   1 year ago COPD exacerbation [facility])   [place] [facility] [place], Salvadore Oxford, NP   1 year ago Mixed diabetic hyperlipidemia associated with type 2 diabetes mellitus [facility])   [place] [facility] [place], Salvadore Oxford, NP       Future Appointments             In 2 months [place], Salvadore Oxford, NP [place] [facility]

## 2023-06-27 ENCOUNTER — Other Ambulatory Visit: Payer: Self-pay | Admitting: Internal Medicine

## 2023-06-29 NOTE — Telephone Encounter (Signed)
Requested Prescriptions  Pending Prescriptions Disp Refills   amLODipine (NORVASC) 5 MG tablet [Pharmacy Med Name: AMLODIPINE BESYLATE 5 MG TAB] 90 tablet 0    Sig: Take 1 tablet (5 mg total) by mouth daily.     Cardiovascular: Calcium Channel Blockers 2 Passed - 06/27/2023  6:47 PM      Passed - Last BP in normal range    BP Readings from Last 1 Encounters:  02/12/23 126/84         Passed - Last Heart Rate in normal range    Pulse Readings from Last 1 Encounters:  02/12/23 82         Passed - Valid encounter within last 6 months    Recent Outpatient Visits           4 months ago Encounter for general adult medical examination with abnormal findings   [facility] [place], [place] W, NP   12 months ago Uncontrolled type 2 diabetes mellitus with hyperglycemia [facility])   [place] [facility] [place], Salvadore Oxford, NP   1 year ago Encounter for general adult medical examination with abnormal findings   [place] [facility] [place], Salvadore Oxford, NP   1 year ago COPD exacerbation [facility])   [place] [facility] [place], Salvadore Oxford, NP   1 year ago Mixed diabetic hyperlipidemia associated with type 2 diabetes mellitus [facility])    [facility] [place], Salvadore [place], [place]

## 2023-07-06 DIAGNOSIS — R200 Anesthesia of skin: Secondary | ICD-10-CM | POA: Diagnosis not present

## 2023-07-06 DIAGNOSIS — Z8673 Personal history of transient ischemic attack (TIA), and cerebral infarction without residual deficits: Secondary | ICD-10-CM | POA: Diagnosis not present

## 2023-07-06 DIAGNOSIS — R0600 Dyspnea, unspecified: Secondary | ICD-10-CM | POA: Diagnosis not present

## 2023-07-06 DIAGNOSIS — R482 Apraxia: Secondary | ICD-10-CM | POA: Diagnosis not present

## 2023-07-06 DIAGNOSIS — R2689 Other abnormalities of gait and mobility: Secondary | ICD-10-CM | POA: Diagnosis not present

## 2023-07-06 DIAGNOSIS — M545 Low back pain, unspecified: Secondary | ICD-10-CM | POA: Diagnosis not present

## 2023-07-06 DIAGNOSIS — I639 Cerebral infarction, unspecified: Secondary | ICD-10-CM | POA: Diagnosis not present

## 2023-07-06 DIAGNOSIS — R531 Weakness: Secondary | ICD-10-CM | POA: Diagnosis not present

## 2023-07-06 DIAGNOSIS — G459 Transient cerebral ischemic attack, unspecified: Secondary | ICD-10-CM | POA: Diagnosis not present

## 2023-07-09 DIAGNOSIS — H402232 Chronic angle-closure glaucoma, bilateral, moderate stage: Secondary | ICD-10-CM | POA: Diagnosis not present

## 2023-07-12 DIAGNOSIS — E119 Type 2 diabetes mellitus without complications: Secondary | ICD-10-CM | POA: Diagnosis not present

## 2023-07-12 DIAGNOSIS — E1165 Type 2 diabetes mellitus with hyperglycemia: Secondary | ICD-10-CM | POA: Diagnosis not present

## 2023-07-12 DIAGNOSIS — E1149 Type 2 diabetes mellitus with other diabetic neurological complication: Secondary | ICD-10-CM | POA: Diagnosis not present

## 2023-07-12 DIAGNOSIS — F32A Depression, unspecified: Secondary | ICD-10-CM | POA: Diagnosis not present

## 2023-07-12 DIAGNOSIS — J449 Chronic obstructive pulmonary disease, unspecified: Secondary | ICD-10-CM | POA: Diagnosis not present

## 2023-07-12 DIAGNOSIS — I10 Essential (primary) hypertension: Secondary | ICD-10-CM | POA: Diagnosis not present

## 2023-07-12 DIAGNOSIS — G4733 Obstructive sleep apnea (adult) (pediatric): Secondary | ICD-10-CM | POA: Diagnosis not present

## 2023-08-01 ENCOUNTER — Other Ambulatory Visit: Payer: Self-pay | Admitting: Internal Medicine

## 2023-08-02 NOTE — Telephone Encounter (Signed)
No longer under prescriber care. Requested Prescriptions  Pending Prescriptions Disp Refills   fluticasone (FLONASE) 50 MCG/ACT nasal spray [Pharmacy Med Name: FLUTICASONE PROP 50 MCG SPRAY] 48 g 0    Sig: Place 1 spray into both nostrils daily.     Ear, Nose, and Throat: Nasal Preparations - Corticosteroids Passed - 08/01/2023  8:54 AM      Passed - Valid encounter within last 12 months    Recent Outpatient Visits           5 months ago Encounter for general adult medical examination with abnormal findings   [place] [facility] [place], [place], NP   1 year ago Uncontrolled type 2 diabetes mellitus with hyperglycemia [facility])   [place] [facility] [place], Salvadore Oxford, NP   1 year ago Encounter for general adult medical examination with abnormal findings   [place] [facility] [place], Salvadore Oxford, NP   1 year ago COPD exacerbation [facility])   [place] [facility] [place], [place] W, NP   2 years ago Mixed diabetic hyperlipidemia associated with type 2 diabetes mellitus [facility])    [facility] [place], Salvadore Oxford, [place]

## 2023-08-09 DIAGNOSIS — E1159 Type 2 diabetes mellitus with other circulatory complications: Secondary | ICD-10-CM | POA: Diagnosis not present

## 2023-08-09 DIAGNOSIS — Z794 Long term (current) use of insulin: Secondary | ICD-10-CM | POA: Diagnosis not present

## 2023-08-09 DIAGNOSIS — E1142 Type 2 diabetes mellitus with diabetic polyneuropathy: Secondary | ICD-10-CM | POA: Diagnosis not present

## 2023-08-09 DIAGNOSIS — Z23 Encounter for immunization: Secondary | ICD-10-CM | POA: Diagnosis not present

## 2023-08-09 DIAGNOSIS — I10 Essential (primary) hypertension: Secondary | ICD-10-CM | POA: Diagnosis not present

## 2023-08-13 DIAGNOSIS — H402232 Chronic angle-closure glaucoma, bilateral, moderate stage: Secondary | ICD-10-CM | POA: Diagnosis not present

## 2023-08-18 ENCOUNTER — Ambulatory Visit: Payer: Medicare HMO | Admitting: Internal Medicine

## 2023-08-23 DIAGNOSIS — F410 Panic disorder [episodic paroxysmal anxiety] without agoraphobia: Secondary | ICD-10-CM | POA: Diagnosis not present

## 2023-08-23 DIAGNOSIS — F3189 Other bipolar disorder: Secondary | ICD-10-CM | POA: Diagnosis not present

## 2023-08-23 DIAGNOSIS — F419 Anxiety disorder, unspecified: Secondary | ICD-10-CM | POA: Diagnosis not present

## 2023-08-24 DIAGNOSIS — H402232 Chronic angle-closure glaucoma, bilateral, moderate stage: Secondary | ICD-10-CM | POA: Diagnosis not present

## 2023-08-30 DIAGNOSIS — E1169 Type 2 diabetes mellitus with other specified complication: Secondary | ICD-10-CM | POA: Diagnosis not present

## 2023-08-31 DIAGNOSIS — H402233 Chronic angle-closure glaucoma, bilateral, severe stage: Secondary | ICD-10-CM | POA: Diagnosis not present

## 2023-09-06 DIAGNOSIS — M542 Cervicalgia: Secondary | ICD-10-CM | POA: Diagnosis not present

## 2023-09-06 DIAGNOSIS — G459 Transient cerebral ischemic attack, unspecified: Secondary | ICD-10-CM | POA: Diagnosis not present

## 2023-09-06 DIAGNOSIS — R252 Cramp and spasm: Secondary | ICD-10-CM | POA: Diagnosis not present

## 2023-09-06 DIAGNOSIS — R200 Anesthesia of skin: Secondary | ICD-10-CM | POA: Diagnosis not present

## 2023-09-06 DIAGNOSIS — R531 Weakness: Secondary | ICD-10-CM | POA: Diagnosis not present

## 2023-09-06 DIAGNOSIS — I639 Cerebral infarction, unspecified: Secondary | ICD-10-CM | POA: Diagnosis not present

## 2023-09-06 DIAGNOSIS — Z8673 Personal history of transient ischemic attack (TIA), and cerebral infarction without residual deficits: Secondary | ICD-10-CM | POA: Diagnosis not present

## 2023-09-06 DIAGNOSIS — R2689 Other abnormalities of gait and mobility: Secondary | ICD-10-CM | POA: Diagnosis not present

## 2023-09-06 DIAGNOSIS — R0600 Dyspnea, unspecified: Secondary | ICD-10-CM | POA: Diagnosis not present

## 2023-09-12 ENCOUNTER — Other Ambulatory Visit: Payer: Self-pay | Admitting: Internal Medicine

## 2023-09-13 ENCOUNTER — Other Ambulatory Visit: Payer: Self-pay | Admitting: Internal Medicine

## 2023-09-13 DIAGNOSIS — Z8673 Personal history of transient ischemic attack (TIA), and cerebral infarction without residual deficits: Secondary | ICD-10-CM | POA: Diagnosis not present

## 2023-09-13 DIAGNOSIS — R531 Weakness: Secondary | ICD-10-CM | POA: Diagnosis not present

## 2023-09-13 DIAGNOSIS — R296 Repeated falls: Secondary | ICD-10-CM | POA: Diagnosis not present

## 2023-09-13 DIAGNOSIS — R2689 Other abnormalities of gait and mobility: Secondary | ICD-10-CM | POA: Diagnosis not present

## 2023-09-13 DIAGNOSIS — E1165 Type 2 diabetes mellitus with hyperglycemia: Secondary | ICD-10-CM

## 2023-09-14 NOTE — Telephone Encounter (Signed)
Requested Prescriptions  Pending Prescriptions Disp Refills   pantoprazole (PROTONIX) 40 MG tablet [Pharmacy Med Name: PANTOPRAZOLE SOD DR 40 MG TAB] 180 tablet 0    Sig: Take 1 tablet (40 mg total) by mouth 2 (two) times daily.     Gastroenterology: Proton Pump Inhibitors Passed - 09/12/2023  2:22 PM      Passed - Valid encounter within last 12 months    Recent Outpatient Visits           7 months ago Encounter for general adult medical examination with abnormal findings   [place] [facility] [place], [place], NP   1 year ago Uncontrolled type 2 diabetes mellitus with hyperglycemia [facility])   [place] [facility] [place], Salvadore Oxford, NP   1 year ago Encounter for general adult medical examination with abnormal findings   [place] [facility] [place], Salvadore Oxford, NP   1 year ago COPD exacerbation [facility])   [facility], [place] W, NP   2 years ago Mixed diabetic hyperlipidemia associated with type 2 diabetes mellitus [facility])   [place] [facility] [place], Salvadore Oxford, [place]

## 2023-09-14 NOTE — Telephone Encounter (Signed)
Requested Prescriptions  Pending Prescriptions Disp Refills   rosuvastatin (CRESTOR) 10 MG tablet [Pharmacy Med Name: ROSUVASTATIN CALCIUM 10 MG TAB] 90 tablet 0    Sig: Take 1 tablet (10 mg total) by mouth daily.     Cardiovascular:  Antilipid - Statins 2 Failed - 09/13/2023  7:08 AM      Failed - Lipid Panel in normal range within the last 12 months    Cholesterol  Date Value Ref Range Status  02/12/2023 109 <200 mg/dL Final   LDL Cholesterol (Calc)  Date Value Ref Range Status  02/12/2023 41 mg/dL (calc) Final    Comment:    Reference range: <100 . Desirable range <100 mg/dL for primary prevention;   <70 mg/dL for patients with CHD or diabetic patients  with > or = 2 CHD risk factors. Marland Kitchen LDL-C is now calculated using the Martin-Hopkins  calculation, which is a validated novel method providing  better accuracy than the Friedewald equation in the  estimation of LDL-C.  Horald Pollen et al. Lenox Ahr. 1610;960(45): 2061-2068  (http://education.[facility].com/faq/FAQ164)    HDL  Date Value Ref Range Status  02/12/2023 54 > OR = 50 mg/dL Final   Triglycerides  Date Value Ref Range Status  02/12/2023 55 <150 mg/dL Final         Passed - Cr in normal range and within 360 days    Creat  Date Value Ref Range Status  02/12/2023 0.90 0.50 - 1.05 mg/dL Final   Creatinine, Urine  Date Value Ref Range Status  02/12/2023 148 20 - 275 mg/dL Final         Passed - Patient is not pregnant      Passed - Valid encounter within last 12 months    Recent Outpatient Visits           7 months ago Encounter for general adult medical examination with abnormal findings   [place] [facility] [place], [place] W, NP   1 year ago Uncontrolled type 2 diabetes mellitus with hyperglycemia [facility])   [place] [facility] [place], Salvad[place] Oxford, NP   1 year ago Encounter for general adult medical examination with abnormal findings   [place] [facility] [place], Salvadore Oxford, NP   1 year ago COPD exacerbation [facility])   [place] [facility] [place], [place], NP   2 years ago Mixed diabetic hyperlipidemia associated with type 2 diabetes mellitus [facility])   [place] [facility] [place], Salvadore Oxford, [place]

## 2023-09-24 ENCOUNTER — Other Ambulatory Visit: Payer: Self-pay | Admitting: Internal Medicine

## 2023-09-24 DIAGNOSIS — K5900 Constipation, unspecified: Secondary | ICD-10-CM

## 2023-09-24 DIAGNOSIS — H402233 Chronic angle-closure glaucoma, bilateral, severe stage: Secondary | ICD-10-CM | POA: Diagnosis not present

## 2023-09-27 NOTE — Telephone Encounter (Signed)
Pt has new PCP Manson Allan, NP  Requested Prescriptions  Refused Prescriptions Disp Refills   amLODipine (NORVASC) 5 MG tablet [Pharmacy Med Name: AMLODIPINE BESYLATE 5 MG TAB] 90 tablet 0    Sig: Take 1 tablet (5 mg total) by mouth daily.     Cardiovascular: Calcium Channel Blockers 2 Failed - 09/24/2023 11:33 AM      Failed - Valid encounter within last 6 months    Recent Outpatient Visits           7 months ago Encounter for general adult medical examination with abnormal findings   [place] [facility] [place], [place], NP   1 year ago Uncontrolled type 2 diabetes mellitus with hyperglycemia [facility])   [place] [facility] [place], Salvadore Oxford, NP   1 year ago Encounter for general adult medical examination with abnormal findings   [facility] [place], Salvad[place] Oxford, NP   1 year ago COPD exacerbation [facility])   [place] [facility] [place], [place], NP   2 years ago Mixed diabetic hyperlipidemia associated with type 2 diabetes mellitus [address] [facility])   [place] [facility] [place], [place] W, NP              Passed - Last BP in normal range    BP Readings from Last 1 Encounters:  02/12/23 126/84         Passed - Last Heart Rate in normal range    Pulse Readings from Last 1 Encounters:  02/12/23 82          LINZESS 290 MCG CAPS capsule [Pharmacy Med Name: Karlene Einstein 290 MCG CAPSULE] 90 capsule 0    Sig: Take 1 capsule (290 mcg total) by mouth daily before breakfast.     Gastroenterology: Irritable Bowel Syndrome Passed - 09/24/2023 11:33 AM      Passed - Valid encounter within last 12 months    Recent Outpatient Visits           7 months ago Encounter for general adult medical examination with abnormal findings   [place] [facility] [place], [place], NP   1 year ago Uncontrolled type 2 diabetes mellitus with hyperglycemia [facility])   [place] [facility]  [place], Salvadore Oxford, NP   1 year ago Encounter for general adult medical examination with abnormal findings   [place] [facility] [place], Salvadore Oxford, NP   1 year ago COPD exacerbation [facility])   [place] [facility] [place], [place], NP   2 years ago Mixed diabetic hyperlipidemia associated with type 2 diabetes mellitus [facility])   [place] [facility] [place], Salvad[place], [place]

## 2023-09-28 ENCOUNTER — Emergency Department: Payer: Medicare HMO

## 2023-09-28 ENCOUNTER — Observation Stay
Admission: EM | Admit: 2023-09-28 | Discharge: 2023-09-29 | Disposition: A | Discharge status: Home / Self Care | Payer: Medicare HMO | Attending: Internal Medicine | Admitting: Internal Medicine

## 2023-09-28 ENCOUNTER — Inpatient Hospital Stay: Payer: Medicare HMO

## 2023-09-28 ENCOUNTER — Encounter: Payer: Self-pay | Admitting: Emergency Medicine

## 2023-09-28 ENCOUNTER — Other Ambulatory Visit: Payer: Self-pay

## 2023-09-28 DIAGNOSIS — G9389 Other specified disorders of brain: Secondary | ICD-10-CM | POA: Diagnosis not present

## 2023-09-28 DIAGNOSIS — R9389 Abnormal findings on diagnostic imaging of other specified body structures: Secondary | ICD-10-CM | POA: Diagnosis not present

## 2023-09-28 DIAGNOSIS — Z2989 Encounter for other specified prophylactic measures: Secondary | ICD-10-CM | POA: Diagnosis not present

## 2023-09-28 DIAGNOSIS — G4733 Obstructive sleep apnea (adult) (pediatric): Secondary | ICD-10-CM | POA: Diagnosis not present

## 2023-09-28 DIAGNOSIS — Z8673 Personal history of transient ischemic attack (TIA), and cerebral infarction without residual deficits: Secondary | ICD-10-CM | POA: Diagnosis not present

## 2023-09-28 DIAGNOSIS — R918 Other nonspecific abnormal finding of lung field: Secondary | ICD-10-CM | POA: Diagnosis not present

## 2023-09-28 DIAGNOSIS — J189 Pneumonia, unspecified organism: Secondary | ICD-10-CM | POA: Diagnosis not present

## 2023-09-28 DIAGNOSIS — I700 Atherosclerosis of aorta: Secondary | ICD-10-CM | POA: Diagnosis not present

## 2023-09-28 DIAGNOSIS — Z79899 Other long term (current) drug therapy: Secondary | ICD-10-CM | POA: Diagnosis not present

## 2023-09-28 DIAGNOSIS — G9341 Metabolic encephalopathy: Secondary | ICD-10-CM | POA: Diagnosis present

## 2023-09-28 DIAGNOSIS — R9089 Other abnormal findings on diagnostic imaging of central nervous system: Secondary | ICD-10-CM | POA: Diagnosis not present

## 2023-09-28 DIAGNOSIS — M47812 Spondylosis without myelopathy or radiculopathy, cervical region: Secondary | ICD-10-CM | POA: Diagnosis not present

## 2023-09-28 DIAGNOSIS — R4182 Altered mental status, unspecified: Secondary | ICD-10-CM

## 2023-09-28 DIAGNOSIS — I771 Stricture of artery: Secondary | ICD-10-CM | POA: Diagnosis not present

## 2023-09-28 DIAGNOSIS — E876 Hypokalemia: Secondary | ICD-10-CM | POA: Diagnosis not present

## 2023-09-28 DIAGNOSIS — Z87891 Personal history of nicotine dependence: Secondary | ICD-10-CM | POA: Diagnosis not present

## 2023-09-28 DIAGNOSIS — A419 Sepsis, unspecified organism: Secondary | ICD-10-CM | POA: Insufficient documentation

## 2023-09-28 DIAGNOSIS — F32A Depression, unspecified: Secondary | ICD-10-CM | POA: Diagnosis not present

## 2023-09-28 DIAGNOSIS — F419 Anxiety disorder, unspecified: Secondary | ICD-10-CM | POA: Diagnosis present

## 2023-09-28 DIAGNOSIS — J449 Chronic obstructive pulmonary disease, unspecified: Secondary | ICD-10-CM | POA: Diagnosis not present

## 2023-09-28 DIAGNOSIS — R4781 Slurred speech: Secondary | ICD-10-CM | POA: Insufficient documentation

## 2023-09-28 DIAGNOSIS — Z0389 Encounter for observation for other suspected diseases and conditions ruled out: Secondary | ICD-10-CM | POA: Diagnosis not present

## 2023-09-28 DIAGNOSIS — I63522 Cerebral infarction due to unspecified occlusion or stenosis of left anterior cerebral artery: Secondary | ICD-10-CM | POA: Diagnosis not present

## 2023-09-28 DIAGNOSIS — I10 Essential (primary) hypertension: Secondary | ICD-10-CM | POA: Diagnosis present

## 2023-09-28 DIAGNOSIS — R1310 Dysphagia, unspecified: Secondary | ICD-10-CM | POA: Diagnosis not present

## 2023-09-28 DIAGNOSIS — J441 Chronic obstructive pulmonary disease with (acute) exacerbation: Secondary | ICD-10-CM | POA: Diagnosis present

## 2023-09-28 DIAGNOSIS — Z7901 Long term (current) use of anticoagulants: Secondary | ICD-10-CM | POA: Diagnosis not present

## 2023-09-28 DIAGNOSIS — Z1152 Encounter for screening for COVID-19: Secondary | ICD-10-CM | POA: Diagnosis not present

## 2023-09-28 DIAGNOSIS — R531 Weakness: Secondary | ICD-10-CM | POA: Diagnosis not present

## 2023-09-28 DIAGNOSIS — R29818 Other symptoms and signs involving the nervous system: Secondary | ICD-10-CM | POA: Diagnosis present

## 2023-09-28 DIAGNOSIS — J168 Pneumonia due to other specified infectious organisms: Secondary | ICD-10-CM | POA: Diagnosis not present

## 2023-09-28 HISTORY — DX: Metabolic encephalopathy: G93.41

## 2023-09-28 HISTORY — DX: Hypokalemia: E87.6

## 2023-09-28 LAB — DIFFERENTIAL
Abs Immature Granulocytes: 0.08 10*3/uL — ABNORMAL HIGH (ref 0.00–0.07)
Basophils Absolute: 0.1 10*3/uL (ref 0.0–0.1)
Basophils Relative: 0 %
Eosinophils Absolute: 0.2 10*3/uL (ref 0.0–0.5)
Eosinophils Relative: 1 %
Immature Granulocytes: 1 %
Lymphocytes Relative: 17 %
Lymphs Abs: 2.9 10*3/uL (ref 0.7–4.0)
Monocytes Absolute: 1.1 10*3/uL — ABNORMAL HIGH (ref 0.1–1.0)
Monocytes Relative: 6 %
Neutro Abs: 13.1 10*3/uL — ABNORMAL HIGH (ref 1.7–7.7)
Neutrophils Relative %: 75 %

## 2023-09-28 LAB — LIPID PANEL
Cholesterol: 86 mg/dL (ref 0–200)
HDL: 42 mg/dL (ref >40)
LDL Cholesterol: 35 mg/dL (ref 0–99)
Total CHOL/HDL Ratio: 2 {ratio}
Triglycerides: 43 mg/dL (ref <150)
VLDL: 9 mg/dL (ref 0–40)

## 2023-09-28 LAB — COMPREHENSIVE METABOLIC PANEL
ALT: 11 U/L (ref 0–44)
AST: 17 U/L (ref 15–41)
Albumin: 3 g/dL — ABNORMAL LOW (ref 3.5–5.0)
Alkaline Phosphatase: 64 U/L (ref 38–126)
Anion gap: 13 (ref 5–15)
BUN: 15 mg/dL (ref 8–23)
CO2: 22 mmol/L (ref 22–32)
Calcium: 7.9 mg/dL — ABNORMAL LOW (ref 8.9–10.3)
Chloride: 105 mmol/L (ref 98–111)
Creatinine, Ser: 1.05 mg/dL — ABNORMAL HIGH (ref 0.44–1.00)
GFR, Estimated: 57 mL/min — ABNORMAL LOW (ref >60)
Glucose, Bld: 166 mg/dL — ABNORMAL HIGH (ref 70–99)
Potassium: 3 mmol/L — ABNORMAL LOW (ref 3.5–5.1)
Sodium: 140 mmol/L (ref 135–145)
Total Bilirubin: 0.4 mg/dL (ref <1.2)
Total Protein: 6.3 g/dL — ABNORMAL LOW (ref 6.5–8.1)

## 2023-09-28 LAB — URINALYSIS, ROUTINE W REFLEX MICROSCOPIC
Bilirubin Urine: NEGATIVE
Glucose, UA: >=500 mg/dL — AB
Hgb urine dipstick: NEGATIVE
Ketones, ur: 20 mg/dL — AB
Leukocytes,Ua: NEGATIVE
Nitrite: NEGATIVE
Protein, ur: NEGATIVE mg/dL
Specific Gravity, Urine: >1.046 — ABNORMAL HIGH (ref 1.005–1.030)
pH: 5 (ref 5.0–8.0)

## 2023-09-28 LAB — CBC
HCT: 37.3 % (ref 36.0–46.0)
Hemoglobin: 10.9 g/dL — ABNORMAL LOW (ref 12.0–15.0)
MCH: 22.7 pg — ABNORMAL LOW (ref 26.0–34.0)
MCHC: 29.2 g/dL — ABNORMAL LOW (ref 30.0–36.0)
MCV: 77.7 fL — ABNORMAL LOW (ref 80.0–100.0)
Platelets: 152 10*3/uL (ref 150–400)
RBC: 4.8 MIL/uL (ref 3.87–5.11)
RDW: 17.9 % — ABNORMAL HIGH (ref 11.5–15.5)
WBC: 17.3 10*3/uL — ABNORMAL HIGH (ref 4.0–10.5)
nRBC: 0 % (ref 0.0–0.2)

## 2023-09-28 LAB — ETHANOL: Alcohol, Ethyl (B): <10 mg/dL (ref <10)

## 2023-09-28 LAB — GLUCOSE, CAPILLARY
Glucose-Capillary: 168 mg/dL — ABNORMAL HIGH (ref 70–99)
Glucose-Capillary: 130 mg/dL — ABNORMAL HIGH (ref 70–99)
Glucose-Capillary: 145 mg/dL — ABNORMAL HIGH (ref 70–99)

## 2023-09-28 LAB — PROTIME-INR
INR: 1.1 (ref 0.8–1.2)
Prothrombin Time: 14.8 s (ref 11.4–15.2)

## 2023-09-28 LAB — SARS CORONAVIRUS 2 BY RT PCR: SARS Coronavirus 2 by RT PCR: NEGATIVE

## 2023-09-28 LAB — HEMOGLOBIN A1C
Hgb A1c MFr Bld: 7.2 % — ABNORMAL HIGH (ref 4.8–5.6)
Mean Plasma Glucose: 159.94 mg/dL

## 2023-09-28 LAB — LACTIC ACID, PLASMA
Lactic Acid, Venous: 2.5 mmol/L (ref 0.5–1.9)
Lactic Acid, Venous: 1.6 mmol/L (ref 0.5–1.9)

## 2023-09-28 LAB — CBG MONITORING, ED
Glucose-Capillary: 102 mg/dL — ABNORMAL HIGH (ref 70–99)
Glucose-Capillary: 190 mg/dL — ABNORMAL HIGH (ref 70–99)

## 2023-09-28 LAB — HIV ANTIBODY (ROUTINE TESTING W REFLEX): HIV Screen 4th Generation wRfx: NONREACTIVE

## 2023-09-28 LAB — APTT: aPTT: 28 s (ref 24–36)

## 2023-09-28 MED ORDER — LACTATED RINGERS IV BOLUS
1000.0000 mL | Freq: Once | INTRAVENOUS | Status: AC
  Administered 2023-09-28: 1000 mL via INTRAVENOUS

## 2023-09-28 MED ORDER — ALBUTEROL SULFATE (2.5 MG/3ML) 0.083% IN NEBU: 2.5000 mg | INHALATION_SOLUTION | RESPIRATORY_TRACT | Status: DC | PRN

## 2023-09-28 MED ORDER — ASPIRIN 81 MG PO TBEC
81.0000 mg | DELAYED_RELEASE_TABLET | Freq: Every day | ORAL | Status: DC
  Administered 2023-09-28 – 2023-09-29 (×2): 81 mg via ORAL
  Filled 2023-09-28 (×2): qty 1

## 2023-09-28 MED ORDER — BUPROPION HCL ER (XL) 300 MG PO TB24
300.0000 mg | ORAL_TABLET | Freq: Every day | ORAL | Status: DC
  Administered 2023-09-28 – 2023-09-29 (×2): 300 mg via ORAL
  Filled 2023-09-28: qty 2
  Filled 2023-09-28: qty 1

## 2023-09-28 MED ORDER — INSULIN ASPART 100 UNIT/ML IJ SOLN: 0.0000 [IU] | Freq: Every day | INTRAMUSCULAR | Status: DC

## 2023-09-28 MED ORDER — SODIUM CHLORIDE 0.9 % IV SOLN
1.0000 g | Freq: Once | INTRAVENOUS | Status: AC
  Administered 2023-09-28: 1 g via INTRAVENOUS
  Filled 2023-09-28: qty 10

## 2023-09-28 MED ORDER — SODIUM CHLORIDE 0.9 % IV SOLN: 500.0000 mg | INTRAVENOUS | Status: DC

## 2023-09-28 MED ORDER — SODIUM CHLORIDE 0.9 % IV SOLN
500.0000 mg | Freq: Once | INTRAVENOUS | Status: AC
  Administered 2023-09-28: 500 mg via INTRAVENOUS
  Filled 2023-09-28: qty 5

## 2023-09-28 MED ORDER — ACETAMINOPHEN 650 MG RE SUPP: 650.0000 mg | Freq: Four times a day (QID) | RECTAL | Status: DC | PRN

## 2023-09-28 MED ORDER — ROSUVASTATIN CALCIUM 10 MG PO TABS
10.0000 mg | ORAL_TABLET | Freq: Every day | ORAL | Status: DC
  Administered 2023-09-28 – 2023-09-29 (×2): 10 mg via ORAL
  Filled 2023-09-28 (×2): qty 1

## 2023-09-28 MED ORDER — DULOXETINE HCL 60 MG PO CPEP: 60.0000 mg | ORAL_CAPSULE | Freq: Every day | ORAL | Status: DC

## 2023-09-28 MED ORDER — ACETAMINOPHEN 325 MG PO TABS: 650.0000 mg | ORAL_TABLET | Freq: Four times a day (QID) | ORAL | Status: DC | PRN

## 2023-09-28 MED ORDER — CEFTRIAXONE SODIUM 2 G IJ SOLR
2.0000 g | INTRAMUSCULAR | Status: DC
  Administered 2023-09-28: 2 g via INTRAVENOUS
  Filled 2023-09-28 (×2): qty 20

## 2023-09-28 MED ORDER — LACTATED RINGERS IV SOLN
150.0000 mL/h | INTRAVENOUS | Status: AC
  Administered 2023-09-28 (×2): 150 mL/h via INTRAVENOUS

## 2023-09-28 MED ORDER — DEXTROSE 5 % IV SOLN
500.0000 mg | INTRAVENOUS | Status: DC
  Administered 2023-09-29: 500 mg via INTRAVENOUS
  Filled 2023-09-28 (×2): qty 5

## 2023-09-28 MED ORDER — ONDANSETRON HCL 4 MG/2ML IJ SOLN: 4.0000 mg | Freq: Four times a day (QID) | INTRAMUSCULAR | Status: DC | PRN

## 2023-09-28 MED ORDER — ENOXAPARIN SODIUM 60 MG/0.6ML IJ SOSY
45.0000 mg | PREFILLED_SYRINGE | INTRAMUSCULAR | Status: DC
  Administered 2023-09-28 – 2023-09-29 (×2): 45 mg via SUBCUTANEOUS
  Filled 2023-09-28 (×2): qty 0.6

## 2023-09-28 MED ORDER — STROKE: EARLY STAGES OF RECOVERY BOOK
Freq: Once | Status: DC
Start: 2023-09-29 — End: 2023-09-29

## 2023-09-28 MED ORDER — POTASSIUM CHLORIDE CRYS ER 20 MEQ PO TBCR
40.0000 meq | EXTENDED_RELEASE_TABLET | Freq: Once | ORAL | Status: AC
  Administered 2023-09-28: 40 meq via ORAL
  Filled 2023-09-28: qty 2

## 2023-09-28 MED ORDER — ONDANSETRON HCL 4 MG PO TABS: 4.0000 mg | ORAL_TABLET | Freq: Four times a day (QID) | ORAL | Status: DC | PRN

## 2023-09-28 MED ORDER — POTASSIUM CHLORIDE 10 MEQ/100ML IV SOLN
10.0000 meq | INTRAVENOUS | Status: AC
  Administered 2023-09-28 (×2): 10 meq via INTRAVENOUS
  Filled 2023-09-28: qty 100

## 2023-09-28 MED ORDER — INSULIN ASPART 100 UNIT/ML IJ SOLN
0.0000 [IU] | Freq: Three times a day (TID) | INTRAMUSCULAR | Status: DC
  Administered 2023-09-28: 3 [IU] via SUBCUTANEOUS
  Administered 2023-09-28: 4 [IU] via SUBCUTANEOUS
  Administered 2023-09-29 (×2): 3 [IU] via SUBCUTANEOUS
  Filled 2023-09-28 (×4): qty 1

## 2023-09-28 MED ORDER — PANTOPRAZOLE SODIUM 40 MG PO TBEC
40.0000 mg | DELAYED_RELEASE_TABLET | Freq: Every day | ORAL | Status: DC
  Administered 2023-09-28 – 2023-09-29 (×2): 40 mg via ORAL
  Filled 2023-09-28 (×2): qty 1

## 2023-09-28 MED ORDER — IOHEXOL 350 MG/ML SOLN
100.0000 mL | Freq: Once | INTRAVENOUS | Status: AC | PRN
  Administered 2023-09-28: 100 mL via INTRAVENOUS

## 2023-09-28 NOTE — Assessment & Plan Note (Signed)
Replete and monitor

## 2023-09-28 NOTE — ED Notes (Signed)
Lab called for blood work

## 2023-09-28 NOTE — Care Management Obs Status (Signed)
MEDICARE OBSERVATION STATUS NOTIFICATION   Patient Details  Name: ASHVI PLAISANCE MRN: [id number] Date of Birth: 03/26/1951   Medicare Observation Status Notification Given:  Yes    Carols Clemence, LCSW 09/28/2023, 4:04 PM

## 2023-09-28 NOTE — ED Notes (Signed)
Patient given phone to call daughter.

## 2023-09-28 NOTE — ED Notes (Signed)
Code stroke was called in field by EMS

## 2023-09-28 NOTE — Assessment & Plan Note (Signed)
Secondary to pneumonia, hypokalemia PT eval

## 2023-09-28 NOTE — ED Triage Notes (Signed)
Pt arrived via [facility] from home as CODE STROKE called to [facility] @ 0013 by EMS. Report provided: Pt with sudden onset of symptoms @ 2030 (09/27/23) that included slurred speech, upper and lower bilateral extremity weakness and AMS.   LVO-1  CBG-180  BP: 161 115 HR: 99 O2/RA: 93%   ** On arrival pt cleared by EDP and sent to CT by EMS stretcher.

## 2023-09-28 NOTE — Evaluation (Signed)
Occupational Therapy Evaluation Patient Details Name: Rachel Nielsen MRN: [id number] DOB: 08-23-1951 Today's Date: 09/28/2023   History of Present Illness Rachel Nielsen is a 71 y.o. female with medical history significant for COPD,  hypertension, anxiety, OSA, CAD and multiple previous stroke with residual left-sided weakness, dependent on a walker, who presented as a code stroke after reportedly awaking from sleep with slurred speech and generalized weakness. Pt admitted for stroke workup. CT/MRI negative for acute findings.   Clinical Impression   Rachel Nielsen was seen for OT evaluation this date. Prior to hospital admission, pt was generally independent with ADL management. She reports sponge bathing at baseline and using a 4WW for [facility] mobility, SPC for community distances. Pt lives with her adult son in a 1 level home with ~2 STE and her son available to assist 24/7. Pt presents to acute OT demonstrating impaired ADL performance and functional mobility 2/2 generalized weakness (no focal deficit appreciated this date), decreased activity tolerance, and limited balance (See OT problem list for additional functional deficits). Pt currently requires MIN A for LB bathing and dressing, CGA for functional transfers.  Pt would benefit from skilled OT services to address noted impairments and functional limitations (see below for any additional details) in order to maximize safety and independence while minimizing falls risk and caregiver burden. Anticipate the need for follow up OT services in the home setting upon acute hospital DC.        If plan is discharge home, recommend the following: Assist for transportation;Assistance with cooking/housework;A little help with walking and/or transfers;A little help with bathing/dressing/bathroom    Functional Status Assessment  Patient has had a recent decline in their functional status and demonstrates the ability to make significant improvements in function in a  reasonable and predictable amount of time.  Equipment Recommendations  BSC/3in1    Recommendations for Other Services       Precautions / Restrictions Precautions Precautions: Fall Restrictions Weight Bearing Restrictions: No      Mobility Bed Mobility Overal bed mobility: Needs Assistance Bed Mobility: Supine to Sit, Sit to Supine     Supine to sit: Contact guard Sit to supine: Min assist        Transfers Overall transfer level: Needs assistance Equipment used: 1 person hand held assist Transfers: Sit to/from Stand Sit to Stand: Contact guard assist                  Balance Overall balance assessment: Needs assistance Sitting-balance support: Single extremity supported, No upper extremity supported, Feet supported Sitting balance-Leahy Scale: Fair Sitting balance - Comments: fatigues quickly   Standing balance support: Reliant on assistive device for balance, During functional activity, Bilateral upper extremity supported Standing balance-Leahy Scale: Fair                             ADL either performed or assessed with clinical judgement   ADL Overall ADL's : Needs assistance/impaired Eating/Feeding: Sitting;Supervision/ safety;Set up               Upper Body Dressing : Sitting;Supervision/safety;Set up   Lower Body Dressing: Minimal assistance;Sit to/from stand;With adaptive equipment   Toilet Transfer: BSC/3in1;Contact guard assist;Set up;Stand-pivot   Toileting- Clothing Manipulation and Hygiene: Set up;Contact guard assist;Sitting/lateral lean;Sit to/from stand       Functional mobility during ADLs: Rolling walker (2 wheels);Cueing for safety;Contact guard assist General ADL Comments: HHA for STS from edge of gurney in  ED, able to take side steps toward [facility] and SPT with HHA for balance but no physical assist.     Vision Baseline Vision/History: 1 Wears glasses Ability to See in Adequate Light: 1 Impaired Patient Visual  Report: No change from baseline       Perception         Praxis         Pertinent Vitals/Pain Pain Assessment Pain Assessment: No/denies pain     Extremity/Trunk Assessment Upper Extremity Assessment Upper Extremity Assessment: Generalized weakness   Lower Extremity Assessment Lower Extremity Assessment: Generalized weakness   Cervical / Trunk Assessment Cervical / Trunk Assessment: Normal   Communication Communication Communication: No apparent difficulties Cueing Techniques: Verbal cues   Cognition Arousal: Alert Behavior During Therapy: WFL for tasks assessed/performed Overall Cognitive Status: Within Functional Limits for tasks assessed                                 General Comments: Pleasant, conversational, oriented to self, place, situation. Did not assess for date. Follows VCs consistently during session.     General Comments  VSS t/o session.    Exercises Other Exercises Other Exercises: Pt educated on role of OT in acute setting, safety, falls prevention strategies, and safe use of AE/DME for ADL management.   Shoulder Instructions      Home Living Family/patient expects to be discharged to:: Private residence Living Arrangements: Children Available Help at Discharge: Family;Available 24 hours/day Type of Home: House Home Access: Stairs to enter [employer] of Steps: Threshold at back entrance Entrance Stairs-Rails: None Home Layout: One level     Bathroom Shower/Tub: Chief Strategy Officer: Standard     Home Equipment: Rollator (4 wheels);Cane - single point          Prior Functioning/Environment Prior Level of Function : History of Falls (last six months);Needs assist             Mobility Comments: Uses SPC for car transfers, 4WW for household mobility. Walker can't fit through back door. several falls in the last 6 months. ADLs Comments: MOD I for ADL, but has noticed a decline in the last  few weeks.        OT Problem List: Decreased strength;Decreased activity tolerance;Decreased safety awareness;Impaired balance (sitting and/or standing);Decreased knowledge of use of DME or AE;Decreased coordination;Decreased range of motion      OT Treatment/Interventions: Self-care/ADL training;Therapeutic exercise;Therapeutic activities;DME and/or AE instruction;Patient/family education;Balance training;Energy conservation;Neuromuscular education    OT Goals(Current goals can be found in the care plan section) Acute Rehab OT Goals Patient Stated Goal: To go home OT Goal Formulation: With patient Time For Goal Achievement: 10/12/23 Potential to Achieve Goals: Good ADL Goals Pt Will Perform Grooming: sitting;with modified independence Pt Will Perform Lower Body Dressing: sit to/from stand;with set-up;with supervision;with caregiver independent in assisting Pt Will Transfer to Toilet: bedside commode;with set-up;with supervision Pt Will Perform Toileting - Clothing Manipulation and hygiene: with modified independence;sitting/lateral leans;sit to/from stand;with adaptive equipment  OT Frequency: Min 1X/week    Co-evaluation              AM-PAC OT "6 Clicks" Daily Activity     Outcome Measure Help from another person eating meals?: None Help from another person taking care of personal grooming?: None Help from another person toileting, which includes using toliet, bedpan, or urinal?: A Little Help from another person bathing (including washing, rinsing, drying)?:  A Little Help from another person to put on and taking off regular upper body clothing?: A Little Help from another person to put on and taking off regular lower body clothing?: A Little 6 Click Score: 20   End of Session Nurse Communication: Mobility status  Activity Tolerance: Patient tolerated treatment well Patient left: in bed;with call bell/phone within reach (In gurney bed in ED)  OT Visit Diagnosis: Other  abnormalities of gait and mobility (R26.89);Muscle weakness (generalized) (M62.81);Other symptoms and signs involving the nervous system (Z61.096)                Time: 0454-0981 OT Time Calculation (min): 22 min Charges:  OT General Charges $OT Visit: 1 Visit OT Evaluation $OT Eval Moderate Complexity: 1 Mod  Jeancarlos Marchena Streets Robert, M.S., OTR/L 09/28/23, 11:46 AM

## 2023-09-28 NOTE — Assessment & Plan Note (Addendum)
Acute neurologic deficit with history of multiple strokes Patient came in as a code stroke, with generalized weakness and slurred speech seen by teleneuro CT head and CTA head and neck negative for acute stroke Suspect acute encephalopathy related to pneumonia MRI brain Stroke workup as recommended by neurologist Stroke/Telemetry Floor       Neuro Checks       Bedside Swallow Eval       DVT Prophylaxis       IV Fluids, Normal Saline       Head of Bed 30 Degrees       Euglycemia and Avoid Hyperthermia (PRN Acetaminophen) Continue antiplatelets and statins PT eval

## 2023-09-28 NOTE — Consult Note (Signed)
[facility] Consult Services   Patient Name:   Rachel Nielsen, Rachel Nielsen Date of Birth:   03-24-1951 Identification Number:   MRN - [id number] Date of Service:   09/28/2023 00:47:38  Impression:      72 year old female with hypertension, hyperlipidemia, COPD, MI, prior strokes with bilateral chronic weakness dependent on a walker, who awoke from sleep with worsened slurred speech and generalized weakness. Stroke Scale is three for BLE drift and slurred speech. Patient reports she is speaking the way she normally does. She is not a thrombolytic candidate due to likely chronic deficits and no evidence of new lateralizing deficits. Head CT is negative for acute abnormalities. CTA CTP negative for LVO. Patchy lung infiltrates concerning for PNA. Patient will be admitted for encephalopathy work up and stroke rule out.    PLAN  - MRI brain w/o contrast  - TTE w/bubble  - check a1c and LDL  - monitor on tele for afib  - neuro to follow   ---  Our recommendations are outlined below.  Recommendations:        Stroke/Telemetry Floor       Neuro Checks       Bedside Swallow Eval       DVT Prophylaxis       IV Fluids, Normal Saline       Head of Bed 30 Degrees       Euglycemia and Avoid Hyperthermia (PRN Acetaminophen)  Sign Out:       Discussed with Emergency Department Provider    ------------------------------------------------------------------------------  Advanced Imaging: CTA Head and Neck Completed.  CTP Completed.  LVO:No  Patient is not a candidate for NIR   Metrics: Last Known Well: 09/27/2023 21:30:00 Dispatch Time: 09/28/2023 00:47:38 Arrival Time: 09/28/2023 00:25:00 Initial Response Time: 09/28/2023 00:52:52 Symptoms: slurred speech, generalized weakness. Initial patient interaction: 09/28/2023 00:57:01 NIHSS Assessment Completed: 09/28/2023 01:05:52 Patient is not a candidate for Thrombolytic. Thrombolytic Medical Decision: 09/28/2023  01:05:54 Patient was not deemed candidate for Thrombolytic because of following reasons: no new lateralizing deficits seen on exam. Stroke severity too mild (non-disabling) .  CT head showed no acute hemorrhage or acute core infarct.  Primary Provider Notified of Diagnostic Impression and Management Plan on: 09/28/2023 01:14:16    ------------------------------------------------------------------------------  History of Present Illness: Patient is a 72 year old Female.  Patient was brought by private transportation with symptoms of slurred speech, generalized weakness. 72 year old female with hypertension, hyperlipidemia, COPD, history of MI, and strokes with bilateral chronic weakness dependent on a walker, who was last known well at 9:30 p.m. when she went to bed. She awoke later with worsened slurred speech and generalized weakness. She had trouble getting out of bed to her walker and was not able to support her own weight. In the ER Stroke Scale is three. Exam is notable for mild dysarthria and subtle bilateral lower extremity drift which patient reports is likely baseline. Currently her main complaint is fatigue. No new deficits noted.   Past Medical History:      Hypertension      Diabetes Mellitus      Hyperlipidemia      Stroke, COPD, MI  No Anticoagulant use  Antiplatelet use: Yes asa, plavix Reviewed EMR for current medications  Allergies:  Reviewed  Social History: Drug Use: No  Family History: There is no family history of premature cerebrovascular disease pertinent to this consultation  ROS : 14 Points Review of Systems was performed and was negative except mentioned  in HPI.  Past Surgical History: There Is No Surgical History Contributory To Today's Visit    Examination: 1A: Level of Consciousness - Alert; keenly responsive + 0 1B: Ask Month and Age - Both Questions Right + 0 1C: Blink Eyes & Squeeze Hands - Performs Both Tasks + 0 2: Test Horizontal  Extraocular Movements - Normal + 0 3: Test Visual Fields - No Visual Loss + 0 4: Test Facial Palsy (Use Grimace if Obtunded) - Normal symmetry + 0 5A: Test Left Arm Motor Drift - No Drift for 10 Seconds + 0 5B: Test Right Arm Motor Drift - No Drift for 10 Seconds + 0 6A: Test Left Leg Motor Drift - Drift, but doesn't hit bed + 1 6B: Test Right Leg Motor Drift - Drift, but doesn't hit bed + 1 7: Test Limb Ataxia (FNF/Heel-Shin) - No Ataxia + 0 8: Test Sensation - Normal; No sensory loss + 0 9: Test Language/Aphasia - Normal; No aphasia + 0 10: Test Dysarthria - Mild-Moderate Dysarthria: Slurring but can be understood + 1 11: Test Extinction/Inattention - No abnormality + 0  NIHSS Score: 3   Pre-Morbid Modified Rankin Scale: 3 Points = Moderate disability; requiring some help, but able to walk without assistance  Spoke with : er md  This consult was conducted in real time using interactive audio and Immunologist. Patient was informed of the technology being used for this visit and agreed to proceed. Patient located in hospital and provider located at home/office setting.   Patient is being evaluated for possible acute neurologic impairment and high probability of imminent or life-threatening deterioration. I spent total of 22 minutes providing care to this patient, including time for face to face visit via telemedicine, review of medical records, imaging studies and discussion of findings with providers, the patient and/or family.   Dr Othelia Pulling   [facility] For Inpatient follow-up with [facility] physician please call [facility] at [phone]. As we are not an outpatient service for any post hospital discharge needs please contact the hospital for assistance. If you have any questions for the [facility] physicians or need to reconsult for clinical or diagnostic changes please contact us via [facility] at [phone].

## 2023-09-28 NOTE — Care Management CC44 (Signed)
Condition Code 44 Documentation Completed  Patient Details  Name: Rachel Nielsen MRN: [id number] Date of Birth: [birth year]   Condition Code 44 given:  Yes Patient signature on Condition Code 44 notice:  Yes Documentation of 2 MD's agreement:  Yes Code 44 added to claim:  Yes    Allena Katz, LCSW 09/28/2023, 4:04 PM

## 2023-09-28 NOTE — Assessment & Plan Note (Signed)
Hold bisoprolol and amlodipine for permissive hypertension

## 2023-09-28 NOTE — Evaluation (Signed)
Clinical/Bedside Swallow Evaluation Patient Details  Name: Rachel Nielsen MRN: [id number] Date of Birth: November 30, 1950  Today's Date: 09/28/2023 Time: SLP Start Time (ACUTE ONLY): 0935 SLP Stop Time (ACUTE ONLY): 1015 SLP Time Calculation (min) (ACUTE ONLY): 40 min  Past Medical History:  Past Medical History:  Diagnosis Date   Allergy    Anxiety    Asthma    Colon polyps    Constipation    COPD (chronic obstructive pulmonary disease) (HCC)    Depression    GERD (gastroesophageal reflux disease)    Glaucoma    Heart attack (HCC)    Hypertension    Sleep apnea    Sleep apnea    Stroke [facility])    Urinary incontinence    Past Surgical History:  Past Surgical History:  Procedure Laterality Date   APPENDECTOMY     CHOLECYSTECTOMY     COLONOSCOPY WITH PROPOFOL N/A 07/20/2018   Procedure: COLONOSCOPY WITH PROPOFOL;  Surgeon: Toledo, Boykin Nearing, MD;  Location: [facility] ENDOSCOPY;  Service: Gastroenterology;  Laterality: N/A;   ESOPHAGOGASTRODUODENOSCOPY N/A 07/20/2018   Procedure: ESOPHAGOGASTRODUODENOSCOPY (EGD);  Surgeon: Toledo, Boykin Nearing, MD;  Location: [facility] ENDOSCOPY;  Service: Gastroenterology;  Laterality: N/A;   GASTRECTOMY     TUBAL LIGATION     VAGINAL HYSTERECTOMY     HPI:  Pt is a 72 year old female with medical history significant for/including COPD,  hypertension, anxiety, OSA, CAD and multiple previous stroke with residual left-sided weakness, dependent on a walker, who awoke from sleep with worsened slurred speech and generalized weakness. Stroke Scale is three for BLE drift and slurred speech. Patient reports she is speaking the way she normally does. She is not a thrombolytic candidate due to likely chronic deficits and no evidence of new lateralizing deficits. Head CT is negative for acute abnormalities. CTA CTP negative for LVO. Patchy lung infiltrates concerning for PNA. Patient will be admitted for encephalopathy work up and stroke rule out.   MRI:   1. No acute finding.  2.  Small subcortical infarct in the left frontal white matter which  occurred in 2023. No interval insult.    CXR:  opacity in the hypoinflated right lower lung field, greater medially and could be due to atelectasis, pneumonia or aspiration.   OF NOTE:  H/o Severe gastroesophageal reflux and gastritis per chart; only recently changed her PPI per chart notes post reporting increased issues w/ REFLUX activity.    Assessment / Plan / Recommendation  Clinical Impression   Pt seen for BSE today. Pt awake, verbal and followed all instructions appropriately. A/O x3. Just finished w/ OT session. No overt cognitive-linguistic deficits noted in pt's engagement and conversation w/ both OT, NSG and this SLP. Pt gave account of her h/o REFLUX and endorsed recent increased s/s of such.  On RA, afebrile.   Pt appears to present w/ functional oropharyngeal phase swallowing w/ No overt oropharyngeal phase dysphagia noted, No neuromuscular deficits noted during oral intake. Pt consumed po trials of breakfast meal w/ No overt, clinical s/s of aspiration during po trials.  Pt appears at reduced risk for aspiration following general aspiration precautions. However, pt has a baseline h/o of REFLUX and episodes of REFLUX behavior that could result in aspiration of REFLUX material, especially at night. She recently changed her PPI "b/c it wasn't working for me".  ANY Dysmotility or Regurgitation of Reflux material can increase risk for aspiration of the Reflux material during Retrograde flow thus impact Voicing and Pulmonary status. Pt  described issues of globus, dry throat clearing, and hacking cough at times.  During po trials, pt fed self the full breakfast meal w/ all consistencies w/ No overt coughing, decline in vocal quality, or change in respiratory presentation during/post trials. O2 sats remained 98%. Oral phase appeared [facility] w/ timely bolus management, mastication, and control of bolus propulsion for A-P transfer for  swallowing. Oral clearing achieved w/ all trial consistencies -- moistened, soft foods at this meal.  OM Exam appeared [facility] w/ no unilateral weakness noted. Speech Clear w/ min slower rate which pt stated was at her Baseline s/p previous strokes. Pt fed self w/ setup support.  OF NOTE: Pt practiced swallowing Pills WHOLE in Puree w/ NSG/SLP for education.   Recommend continue a Regular consistency diet w/ well-Cut meats, moistened foods; Thin liquids -- carefully monitor straw use. Recommend general aspiration precautions, REFLUX precautions. Pills WHOLE in Puree for safer, easier swallowing IF desired.  Education given on Pills in Puree; food consistencies and easy to eat options; general aspiration precautions to pt. MD/NSG to reconsult if any new swallowing needs arise during admit. NSG updated, agreed. MD updated. Recommend Dietician f/u for support.  SLP Visit Diagnosis: Dysphagia, unspecified (R13.10) (suspect Esophageal phase Dysmotility in setting of known REFLUX activity)    Aspiration Risk   (reduced but at risk for REFLUX aspiration)    Diet Recommendation   Thin;Age appropriate regular (moistened foods cut well) = a Regular consistency diet w/ well-Cut meats, moistened foods; Thin liquids -- carefully monitor straw use. Recommend general aspiration precautions, REFLUX precautions.   Medication Administration: Whole meds with puree (IF easier for swallowing -- did well doing such today)    Other  Recommendations Recommended Consults: Consider GI evaluation;Consider esophageal assessment (Dietician f/u) Oral Care Recommendations: Oral care BID;Patient independent with oral care    Recommendations for follow up therapy are one component of a multi-disciplinary discharge planning process, led by the attending physician.  Recommendations may be updated based on patient status, additional functional criteria and insurance authorization.  Follow up Recommendations No SLP follow up       Assistance Recommended at Discharge  PRN  Functional Status Assessment Patient has not had a recent decline in their functional status  Frequency and Duration  (n/a)   (n/a)       Prognosis Prognosis for improved oropharyngeal function: Good Barriers/Prognosis Comment: REFLUX activity      Swallow Study   General Date of Onset: 09/28/23 HPI: Pt is a 72 year old female with medical history significant for/including COPD,  hypertension, anxiety, OSA, CAD and multiple previous stroke with residual left-sided weakness, dependent on a walker, who awoke from sleep with worsened slurred speech and generalized weakness. Stroke Scale is three for BLE drift and slurred speech. Patient reports she is speaking the way she normally does. She is not a thrombolytic candidate due to likely chronic deficits and no evidence of new lateralizing deficits. Head CT is negative for acute abnormalities. CTA CTP negative for LVO. Patchy lung infiltrates concerning for PNA. Patient will be admitted for encephalopathy work up and stroke rule out.   MRI:   1. No acute finding.  2. Small subcortical infarct in the left frontal white matter which  occurred in 2023. No interval insult.    CXR:  opacity in the hypoinflated right lower lung field, greater medially and could be due to atelectasis, pneumonia or aspiration.   OF NOTE:  H/o Severe gastroesophageal reflux and gastritis per chart; only  recently changed her PPI per chart notes post reporting increased issues w/ REFLUX activity. Type of Study: Bedside Swallow Evaluation Previous Swallow Assessment: none Diet Prior to this Study: Regular;Thin liquids (Level 0) (had been NPO) Temperature Spikes Noted: No (wbc elevated) Respiratory Status: Room air History of Recent Intubation: No Behavior/Cognition: Alert;Cooperative;Pleasant mood (x3) Oral Cavity Assessment: Within Functional Limits Oral Care Completed by SLP: Yes Oral Cavity - Dentition: Adequate natural  dentition Vision: Functional for self-feeding Self-Feeding Abilities: Able to feed self;Needs set up (min) Patient Positioning: Upright in bed Baseline Vocal Quality: Normal Volitional Cough: Strong Volitional Swallow: Able to elicit    Oral/Motor/Sensory Function Overall Oral Motor/Sensory Function: Within functional limits   Ice Chips Ice chips: Not tested   Thin Liquid Thin Liquid: Within functional limits Presentation: Cup;Self Fed;Straw (~8 ozs total) Other Comments: water, coffee    Nectar Thick Nectar Thick Liquid: Not tested   Honey Thick Honey Thick Liquid: Not tested   Puree Puree: Within functional limits Presentation: Self Fed;Spoon (~8+ ozs) Other Comments: grits; applesauce   Solid     Solid: Within functional limits Presentation: Self Fed;Spoon (10+ trials) Other Comments: sausage, eggs        Jerilynn Som, MS, CCC-SLP Speech Language Pathologist Rehab Services; [facility] [phone] (ascom) Lakeisa Heninger 09/28/2023,10:05 AM

## 2023-09-28 NOTE — ED Notes (Signed)
Patient transported to MRI 

## 2023-09-28 NOTE — H&P (Signed)
History and Physical    Patient: Rachel Nielsen:[id number] DOB: 03-28-1951 DOA: 09/28/2023 DOS: the patient was seen and examined on 09/28/2023 PCP: Alm Bustard, NP  Patient coming from: Home  Chief Complaint:  Chief Complaint  Patient presents with   Code Stroke    HPI: Rachel Nielsen is a 72 y.o. female with medical history significant for COPD,  hypertension, anxiety, OSA, CAD and multiple previous stroke with residual left-sided weakness, dependent on a walker, who presented as a code stroke after reportedly awaking from sleep with slurred speech and generalized weakness.  Initial CT head was negative for acute bleed and she was seen in consultation by teleneurology who recommended admission for encephalopathy workup and stroke rule out. Patient states that her son called EMS when she was unable to pull herself up to the walker from the commode.  States it had happened twice before over the past couple weeks and she needed assistance from her son, however at this time he noted that her speech was slurred.  Patient reports feeling weakness all over in both arms and both legs.  States she does not feel like her speech is slurred but that her son told her that it is. Additional ED course and data review: On arrival tachycardic to 102 and tachypneic to 22, BP not documented.  Afebrile Labs notable for WBC 17,000 with lactic acid 2.5, hemoglobin 10.9, down from baseline of 11.9, potassium 3, creatinine 1.05 slightly up from baseline of 0.9. EtOH less than 10 Lactic acid pending EKG, personally viewed and interpreted showing NSR at 99 with nonspecific ST-T wave changes CT head no acute abnormality CTA head and neck negative for LVO, acute ischemia or other perfusion abnormality however noting patchy groundglass opacity in the visualized right lung concerning for pneumonia as follows: IMPRESSION: 1. Negative CTA of the head and neck. No large vessel occlusion or other emergent  finding. 2. Negative CT perfusion with no evidence for acute ischemia or other perfusion abnormality. 3. Patchy ground-glass opacity within the visualized right lung, concerning for pneumonia.  Chest x-ray showed the following: IMPRESSION: 1. Increased opacity in the hypoinflated right lower lung field, greater medially and could be due to atelectasis, pneumonia or aspiration. A follow-up PA Lat study is recommended in full inspiration.  Patient treated with an LR bolus and started on Rocephin and azithromycin Hospitalist consulted for admission for stroke workup and pneumonia with possible sepsis  Review of Systems: As mentioned in the history of present illness. All other systems reviewed and are negative.  Past Medical History:  Diagnosis Date   Allergy    Anxiety    Asthma    Colon polyps    Constipation    COPD (chronic obstructive pulmonary disease) (HCC)    Depression    GERD (gastroesophageal reflux disease)    Glaucoma    Heart attack (HCC)    Hypertension    Sleep apnea    Sleep apnea    Stroke [facility])    Urinary incontinence    Past Surgical History:  Procedure Laterality Date   APPENDECTOMY     CHOLECYSTECTOMY     COLONOSCOPY WITH PROPOFOL N/A 07/20/2018   Procedure: COLONOSCOPY WITH PROPOFOL;  Surgeon: Toledo, Boykin Nearing, MD;  Location: [facility] ENDOSCOPY;  Service: Gastroenterology;  Laterality: N/A;   ESOPHAGOGASTRODUODENOSCOPY N/A 07/20/2018   Procedure: ESOPHAGOGASTRODUODENOSCOPY (EGD);  Surgeon: Toledo, Boykin Nearing, MD;  Location: [facility] ENDOSCOPY;  Service: Gastroenterology;  Laterality: N/A;   GASTRECTOMY  TUBAL LIGATION     VAGINAL HYSTERECTOMY     Social History:  reports that she has quit smoking. She has never used smokeless tobacco. She reports that she does not currently use alcohol. She reports that she does not use drugs.  Allergies  Allergen Reactions   Ace Inhibitors Cough    Family History  Problem Relation Age of Onset   Hypertension  Mother    Atrial fibrillation Mother    Heart attack Mother    Hypertension Father    Stomach cancer Father    Diabetes Father    Liver cancer Father    Diabetes Sister    Hypertension Sister     Prior to Admission medications   Medication Sig Start Date End Date Taking? Authorizing Provider  ACCU-CHEK GUIDE test strip Use to check blood sugar up to twice a day as directed 05/26/22   [name], NP  Accu-Chek Softclix Lancets lancets Use to check blood sugar up to twice a day as directed 05/26/22   [name], NP  albuterol (PROVENTIL) (2.5 MG/3ML) 0.083% nebulizer solution Inhale 3 mLs (2.5 mg total) into the lungs every 4 (four) hours as needed for wheezing or shortness of breath. Patient taking differently: Inhale 2.5 mg into the lungs every 6 (six) hours as needed for wheezing or shortness of breath. 12/23/21 12/23/22  Arnetha Courser, MD  albuterol (VENTOLIN HFA) 108 (90 Base) MCG/ACT inhaler Inhale 2-4 puffs by mouth every 4 hours as needed for wheezing, cough, and/or shortness of breath Patient taking differently: 2 puffs every 6 (six) hours as needed. 10/21/20   Malfi, Jodelle Gross, FNP  ALPRAZolam Prudy Feeler) 1 MG tablet Take 1 mg by mouth 3 (three) times daily as needed for anxiety or sleep. 08/19/21   [provider]  amLODipine (NORVASC) 5 MG tablet Take 1 tablet (5 mg total) by mouth daily. 06/29/23   [name], NP  Asenapine Maleate 10 MG SUBL Place 5 mg under the tongue at bedtime. 04/22/21   [provider]  aspirin 81 MG EC tablet Take 1 tablet (81 mg total) by mouth daily. Swallow whole. 02/10/22   [name], NP  bisoprolol (ZEBETA) 10 MG tablet Take 1 tablet (10 mg total) by mouth daily. OFFICE VISIT NEEDED FOR ADDITIONAL REFILLS 04/26/23   [name], NP  buPROPion (WELLBUTRIN XL) 300 MG 24 hr tablet Take 1 tablet (300 mg total) by mouth daily. 10/08/20   Sharman Cheek, MD  busPIRone (BUSPAR) 15 MG tablet Take 15 mg by mouth 2 (two) times  daily. 11/08/17   [provider]  clopidogrel (PLAVIX) 75 MG tablet Take 75 mg by mouth daily.    [provider]  docusate sodium (COLACE) 100 MG capsule Take 100 mg by mouth daily.    [provider]  Dulaglutide 3 MG/0.5ML SOPN Inject 0.5 mLs (3 mg total) subcutaneously once a week 01/07/22   [provider]  DULoxetine (CYMBALTA) 60 MG capsule Take 60 mg by mouth daily.    [provider]  famotidine (PEPCID) 20 MG tablet Take 20 mg by mouth 2 (two) times daily.    [provider]  FARXIGA 10 MG TABS tablet Take 10 mg by mouth daily. 09/08/21   [provider]  fluticasone (FLONASE) 50 MCG/ACT nasal spray Place 1 spray into both nostrils daily. 02/16/23   [name], NP  fluticasone-salmeterol (ADVAIR) 250-50 MCG/ACT AEPB Inhale 1 puff into the lungs 2 (two) times daily.  09/09/21   [provider]  gabapentin (NEURONTIN) 600 MG tablet Take 600-900 mg by mouth 3 (three) times daily. 600 mg AM, 600 mg pm and 900 mg at bedtime 12/16/20   [provider]  GLOBAL EASE INJECT PEN NEEDLES 32G X 4 MM MISC Use as directed with insulin 05/30/20   Scarboro, Coralee North, NP  Insulin Glargine Solostar (LANTUS) 100 UNIT/ML Solostar Pen Inject 20 Units into the skin at bedtime. Patient not taking: Reported on 02/12/2023 04/02/22 04/02/23  [provider]  Insulin Pen Needle (BD PEN NEEDLE NANO U/F) 32G X 4 MM MISC Inject 1 Device into the skin daily. 05/19/21   [name], NP  Insulin Syringe-Needle U-100 (INSULIN SYRINGE 1CC/31GX5/16") 31G X 5/16" 1 ML MISC 1 Syringe by Does not apply route in the morning, at noon, in the evening, and at bedtime. 06/24/21   [name], NP  lactulose (CHRONULAC) 10 GM/15ML solution Take 30 g by mouth 2 (two) times daily.    [provider]  linaclotide Karlene Einstein) 290 MCG CAPS capsule Take 1 capsule (290 mcg total) by mouth daily before breakfast. 04/12/23   [name], NP   lurasidone (LATUDA) 40 MG TABS tablet Take 40 mg by mouth daily. Patient not taking: Reported on 02/12/2023 04/01/22   [provider]  meloxicam (MOBIC) 15 MG tablet Take 15 mg by mouth daily.    [provider]  metFORMIN (GLUCOPHAGE) 1000 MG tablet Take 1,000 mg by mouth 2 (two) times daily with a meal. 01/21/22   [provider]  mirabegron ER (MYRBETRIQ) 50 MG TB24 tablet Take 1 tablet (50 mg total) by mouth daily. 01/26/22   MacDiarmid, Lorin Picket, MD  montelukast (SINGULAIR) 10 MG tablet Take 1 tablet (10 mg total) by mouth daily. 10/08/20   Sharman Cheek, MD  pantoprazole (PROTONIX) 40 MG tablet Take 1 tablet (40 mg total) by mouth 2 (two) times daily. 09/14/23   [name], NP  predniSONE (DELTASONE) 5 MG tablet Take 5 mg by mouth daily.    [provider]  rosuvastatin (CRESTOR) 10 MG tablet Take 1 tablet (10 mg total) by mouth daily. 09/14/23   [name], NP  TRAVATAN Z 0.004 % SOLN ophthalmic solution Place 1 drop into both eyes at bedtime. 10/08/20   Sharman Cheek, MD    Physical Exam: Vitals:   09/28/23 0028 09/28/23 0056 09/28/23 0056 09/28/23 0230  BP:    127/79  Pulse:  (!) 102  96  Resp:  (!) 22  19  Temp:  98.8 F (37.1 C)    SpO2: 93% 96%  91%  Weight:   93.7 kg   Height:   5\' 4"  (1.626 m)    Physical Exam Vitals and nursing note reviewed.  Constitutional:      General: She is not in acute distress. HENT:     Head: Normocephalic and atraumatic.     Mouth/Throat:     Mouth: Mucous membranes are dry.  Cardiovascular:     Rate and Rhythm: Normal rate and regular rhythm.     Heart sounds: Normal heart sounds.  Pulmonary:     Effort: Pulmonary effort is normal.     Breath sounds: Normal breath sounds.  Abdominal:     Palpations: Abdomen is soft.     Tenderness: There is no abdominal tenderness.  Neurological:     Mental Status: Mental status is at baseline.     Labs on Admission: I have personally  reviewed  following labs and imaging studies  CBC: Recent Labs  Lab 09/28/23 0058  WBC 17.3*  NEUTROABS 13.1*  HGB 10.9*  HCT 37.3  MCV 77.7*  PLT 152   Basic Metabolic Panel: Recent Labs  Lab 09/28/23 0058  NA 140  K 3.0*  CL 105  CO2 22  GLUCOSE 166*  BUN 15  CREATININE 1.05*  CALCIUM 7.9*   GFR: Estimated Creatinine Clearance: 55.3 mL/min (A) (by C-G formula based on SCr of 1.05 mg/dL (H)). Liver Function Tests: Recent Labs  Lab 09/28/23 0058  AST 17  ALT 11  ALKPHOS 64  BILITOT 0.4  PROT 6.3*  ALBUMIN 3.0*   No results for input(s): "LIPASE", "AMYLASE" in the last 168 hours. No results for input(s): "AMMONIA" in the last 168 hours. Coagulation Profile: Recent Labs  Lab 09/28/23 0058  INR 1.1   Cardiac Enzymes: No results for input(s): "CKTOTAL", "CKMB", "CKMBINDEX", "TROPONINI" in the last 168 hours. BNP (last 3 results) No results for input(s): "PROBNP" in the last 8760 hours. HbA1C: No results for input(s): "HGBA1C" in the last 72 hours. CBG: No results for input(s): "GLUCAP" in the last 168 hours. Lipid Profile: No results for input(s): "CHOL", "HDL", "LDLCALC", "TRIG", "CHOLHDL", "LDLDIRECT" in the last 72 hours. Thyroid Function Tests: No results for input(s): "TSH", "T4TOTAL", "FREET4", "T3FREE", "THYROIDAB" in the last 72 hours. Anemia Panel: No results for input(s): "VITAMINB12", "FOLATE", "FERRITIN", "TIBC", "IRON", "RETICCTPCT" in the last 72 hours. Urine analysis:    Component Value Date/Time   COLORURINE YELLOW (A) 05/13/2022 0417   APPEARANCEUR HAZY (A) 05/13/2022 0417   APPEARANCEUR Clear 01/26/2022 1104   LABSPEC 1.029 05/13/2022 0417   PHURINE 5.0 05/13/2022 0417   GLUCOSEU NEGATIVE 05/13/2022 0417   HGBUR SMALL (A) 05/13/2022 0417   BILIRUBINUR NEGATIVE 05/13/2022 0417   BILIRUBINUR Negative 01/26/2022 1104   KETONESUR NEGATIVE 05/13/2022 0417   PROTEINUR NEGATIVE 05/13/2022 0417   NITRITE POSITIVE (A) 05/13/2022 0417    LEUKOCYTESUR SMALL (A) 05/13/2022 0417    Radiological Exams on Admission: CT ANGIO HEAD NECK W WO CM W PERF (CODE STROKE)  Result Date: 09/28/2023 CLINICAL DATA:  Initial evaluation for neuro deficit, stroke. EXAM: CT ANGIOGRAPHY HEAD AND NECK CT PERFUSION BRAIN TECHNIQUE: Multidetector CT imaging of the head and neck was performed using the standard protocol during bolus administration of intravenous contrast. Multiplanar CT image reconstructions and MIPs were obtained to evaluate the vascular anatomy. Carotid stenosis measurements (when applicable) are obtained utilizing NASCET criteria, using the distal internal carotid diameter as the denominator. Multiphase CT imaging of the brain was performed following IV bolus contrast injection. Subsequent parametric perfusion maps were calculated using RAPID software. RADIATION DOSE REDUCTION: This exam was performed according to the departmental dose-optimization program which includes automated exposure control, adjustment of the mA and/or kV according to patient size and/or use of iterative reconstruction technique. CONTRAST:  OMNIPAQUE IOHEXOL 350 MG/ML SOLN COMPARISON:  CT from earlier the same day. FINDINGS: CTA NECK FINDINGS Aortic arch: Examination degraded by motion artifact. Aortic arch within normal limits for caliber standard branch pattern. No stenosis about the origin the great vessels. Right carotid system: No evidence of dissection, stenosis (50% or greater) or occlusion. Left carotid system: No evidence of dissection, stenosis (50% or greater) or occlusion. Vertebral arteries: No evidence of dissection, stenosis (50% or greater) or occlusion. Skeleton: No discrete or worrisome osseous lesions. Moderate cervical spondylosis, most pronounced at C5-6 and C6-7. Other neck: No other acute finding. Upper chest:  Patchy ground-glass opacity within the visualized right lung, concerning for pneumonia. Review of the MIP images confirms the above  findings CTA HEAD FINDINGS Anterior circulation: Both internal carotid arteries widely patent to the termini without stenosis. A1 segments widely patent. Normal anterior communicating artery complex. Both anterior cerebral arteries widely patent to their distal aspects without stenosis. No M1 stenosis or occlusion. Normal MCA bifurcations. Distal MCA branches well perfused and symmetric. Posterior circulation: Both vertebral arteries patent to the vertebrobasilar junction without stenosis. Right PICA patent. Left PICA not well seen. Basilar patent without stenosis. Superior cerebral arteries patent bilaterally. Both PCA supplied via the basilar as well as small bilateral posterior communicating arteries. Both PCAs patent to their distal aspects without stenosis. Venous sinuses: Patent allowing for timing the contrast bolus. Anatomic variants: As above.  No aneurysm. Review of the MIP images confirms the above findings CT Brain Perfusion Findings: ASPECTS: 10 CBF (<30%) Volume: 0mL Perfusion (Tmax>6.0s) volume: 0mL Mismatch Volume: 0mL Infarction Location:Negative CT perfusion with no evidence for acute ischemia or other perfusion abnormality. IMPRESSION: 1. Negative CTA of the head and neck. No large vessel occlusion or other emergent finding. 2. Negative CT perfusion with no evidence for acute ischemia or other perfusion abnormality. 3. Patchy ground-glass opacity within the visualized right lung, concerning for pneumonia. Electronically Signed   By: Rise Mu M.D.   On: 09/28/2023 01:10   CT HEAD CODE STROKE WO CONTRAST  Result Date: 09/28/2023 CLINICAL DATA:  Code stroke. Initial evaluation for neuro deficit, stroke suspected. EXAM: CT HEAD WITHOUT CONTRAST TECHNIQUE: Contiguous axial images were obtained from the base of the skull through the vertex without intravenous contrast. RADIATION DOSE REDUCTION: This exam was performed according to the departmental dose-optimization program which  includes automated exposure control, adjustment of the mA and/or kV according to patient size and/or use of iterative reconstruction technique. COMPARISON:  CT from 05/20/2023. FINDINGS: Brain: Cerebral volume within normal limits. No acute intracranial hemorrhage. No acute large vessel territory infarct. Subcortical hypodensity at the left frontal lobe noted, either a chronic lacunar infarct or dilated perivascular space. No mass lesion, midline shift or mass effect. No hydrocephalus or extra-axial fluid collection. Vascular: No abnormal hyperdense vessel. Focal atherosclerotic calcification involving the right M1 segment noted, stable. Skull: Scalp soft tissues demonstrate no acute finding. Calvarium intact. Sinuses/Orbits: Globes orbital soft tissues within normal limits. Paranasal sinuses and mastoid air cells are clear. Other: None. ASPECTS [facility] Stroke Program Early CT Score) - Ganglionic level infarction (caudate, lentiform nuclei, internal capsule, insula, M1-M3 cortex): 7 - Supraganglionic infarction (M4-M6 cortex): 3 Total score (0-10 with 10 being normal): 10 IMPRESSION: 1. No acute intracranial abnormality. 2. Aspects is 10. Results were called by telephone at the time of interpretation on 09/28/2023 at 12:42 am to provider [facility] , who verbally acknowledged these results. Electronically Signed   By: Rise Mu M.D.   On: 09/28/2023 00:42     Data Reviewed: Relevant notes from primary care and specialist visits, past discharge summaries as available in EHR, including Care Everywhere. Prior diagnostic testing as pertinent to current admission diagnoses Updated medications and problem lists for reconciliation ED course, including vitals, labs, imaging, treatment and response to treatment Triage notes, nursing and pharmacy notes and ED provider's notes Notable results as noted in HPI   Assessment and Plan: CAP (community acquired pneumonia) Sepsis Patient was  tachycardic, tachypneic with leukocytosis 17,000, lactic acidosis pneumonia seen on imaging Patient with leukocytosis and groundglass opacities on CTA head and  neck Continue Rocephin and azithromycin Received IV fluid bolus in the ED Follow respiratory viral panel  Acute metabolic encephalopathy Acute neurologic deficit with history of multiple strokes Patient came in as a code stroke, with generalized weakness and slurred speech seen by teleneuro CT head and CTA head and neck negative for acute stroke Suspect acute encephalopathy related to pneumonia MRI brain Stroke workup as recommended by neurologist Stroke/Telemetry Floor       Neuro Checks       Bedside Swallow Eval       DVT Prophylaxis       IV Fluids, Normal Saline       Head of Bed 30 Degrees       Euglycemia and Avoid Hyperthermia (PRN Acetaminophen) Continue antiplatelets and statins PT eval  Generalized weakness Secondary to pneumonia, hypokalemia PT eval  Hypokalemia Replete and monitor  COPD (chronic obstructive pulmonary disease) (HCC) Not acutely exacerbated Continue home inhalers DuoNeb as needed  OSA (obstructive sleep apnea) CPAP nightly if using at home  Anxiety and depression Continue Cymbalta, bupropion  Hypertension Hold bisoprolol and amlodipine for permissive hypertension     DVT prophylaxis: Lovenox  Consults: neurology  Advance Care Planning:   Code Status: Prior   Family Communication: none  Disposition Plan: Back to previous home environment  Severity of Illness: The appropriate patient status for this patient is INPATIENT. Inpatient status is judged to be reasonable and necessary in order to provide the required intensity of service to ensure the patient's safety. The patient's presenting symptoms, physical exam findings, and initial radiographic and laboratory data in the context of their chronic comorbidities is felt to place them at high risk for further clinical  deterioration. Furthermore, it is not anticipated that the patient will be medically stable for discharge from the hospital within 2 midnights of admission.   * I certify that at the point of admission it is my clinical judgment that the patient will require inpatient hospital care spanning beyond 2 midnights from the point of admission due to high intensity of service, high risk for further deterioration and high frequency of surveillance required.*  Author: Andris Baumann, MD 09/28/2023 2:43 AM  For on call review [email or web address].

## 2023-09-28 NOTE — Progress Notes (Signed)
   09/28/23 1500  Spiritual Encounters  Type of Visit Initial  Care provided to: Patient  Referral source Patient request  Reason for visit Religious ritual  OnCall Visit No  Spiritual Framework  Presenting Themes Courage hope and growth;Impactful experiences and emotions  Community/Connection Family  Patient Stress Factors Health changes  Interventions  Spiritual Care Interventions Made Prayer;Encouragement;Reflective listening;Compassionate presence;Established relationship of care and support   Chaplain received a spiritual consult for prayer. Chaplain met with patient and prayed with her. Patient shared with the chaplain that she lost her best friend a few years ago and that she has had two strokes in the past. Patient was very thankful that the chaplain came. Chaplain services remain available for spiritual and emotional support.

## 2023-09-28 NOTE — ED Notes (Signed)
OT at bedside working with patient

## 2023-09-28 NOTE — Assessment & Plan Note (Signed)
Continue Cymbalta, bupropion

## 2023-09-28 NOTE — ED Notes (Signed)
Lab called to collect blood cultures.

## 2023-09-28 NOTE — Progress Notes (Signed)
PHARMACIST - PHYSICIAN COMMUNICATION  CONCERNING:  Enoxaparin (Lovenox) for DVT Prophylaxis    RECOMMENDATION: Patient was prescribed enoxaprin 40mg  q24 hours for VTE prophylaxis.   Filed Weights   09/28/23 0056  Weight: 93.7 kg (206 lb 9.1 oz)    Body mass index is 35.46 kg/m.  Estimated Creatinine Clearance: 55.3 mL/min (A) (by C-G formula based on SCr of 1.05 mg/dL (H)).   Based on [facility] policy patient is candidate for enoxaparin 0.5mg /kg TBW SQ every 24 hours based on BMI being >30.  DESCRIPTION: Pharmacy has adjusted enoxaparin dose per [facility] policy.  Patient is now receiving enoxaparin 0.5 mg/kg every 24 hours   Otelia Sergeant, PharmD, [facility] 09/28/2023 3:47 AM

## 2023-09-28 NOTE — ED Provider Notes (Signed)
[facility] Provider Note    Event Date/Time   First MD Initiated Contact with Patient 09/28/23 0028     (approximate)   History   Code Stroke   HPI  Rachel Nielsen is a 72 y.o. female who is to the emergency department today because of concerns for slurred speech and weakness.  Patient's symptoms started at roughly 830 last night.  Patient states she does have a history of strokes.  She is appreciating weakness in her lower legs.  She denies any recent illness, no recent fevers nausea or vomiting.     Physical Exam   Triage Vital Signs: ED Triage Vitals  Encounter Vitals Group     BP --      Systolic BP Percentile --      Diastolic BP Percentile --      Pulse Rate 09/28/23 0056 (!) 102     Resp 09/28/23 0056 (!) 22     Temp 09/28/23 0056 98.8 F (37.1 C)     Temp src --      SpO2 09/28/23 0028 93 %     Weight 09/28/23 0056 206 lb 9.1 oz (93.7 kg)     Height 09/28/23 0056 5\' 4"  (1.626 m)     Head Circumference --      Peak Flow --      Pain Score --      Pain Loc --      Pain Education --      Exclude from Growth Chart --     Most recent vital signs: Vitals:   09/28/23 0028 09/28/23 0056  Pulse:  (!) 102  Resp:  (!) 22  Temp:  98.8 F (37.1 C)  SpO2: 93% 96%   General: Awake, alert, oriented. CV:  Good peripheral perfusion. Regular rate and rhythm. Resp:  Normal effort. Lungs clear. Abd:  No distention.    ED Results / Procedures / Treatments   Labs (all labs ordered are listed, but only abnormal results are displayed) Labs Reviewed  CBC - Abnormal; Notable for the following components:      Result Value   WBC 17.3 (*)    Hemoglobin 10.9 (*)    MCV 77.7 (*)    MCH 22.7 (*)    MCHC 29.2 (*)    RDW 17.9 (*)    All other components within normal limits  DIFFERENTIAL - Abnormal; Notable for the following components:   Neutro Abs 13.1 (*)    Monocytes Absolute 1.1 (*)    Abs Immature Granulocytes 0.08 (*)    All other  components within normal limits  PROTIME-INR  APTT  ETHANOL  COMPREHENSIVE METABOLIC PANEL  URINALYSIS, ROUTINE W REFLEX MICROSCOPIC  CBG MONITORING, ED     EKG  I, Phineas Semen, attending physician, personally viewed and interpreted this EKG  EKG Time: 0136 Rate: 99 Rhythm: sinus rhythm Axis: normal Intervals: qtc 464 QRS: narrow, q waves III ST changes: no st elevation Impression: abnormal ekg   RADIOLOGY I independently interpreted and visualized the CT head. My interpretation: No bleed Radiology interpretation:  IMPRESSION:  1. No acute intracranial abnormality.  2. Aspects is 10.    CTA Head IMPRESSION:  1. Negative CTA of the head and neck. No large vessel occlusion or  other emergent finding.  2. Negative CT perfusion with no evidence for acute ischemia or  other perfusion abnormality.  3. Patchy ground-glass opacity within the visualized right lung,  concerning for pneumonia.  PROCEDURES:  Critical Care performed: Yes  CRITICAL CARE Performed by: Phineas Semen   Total critical care time: 30 minutes  Critical care time was exclusive of separately billable procedures and treating other patients.  Critical care was necessary to treat or prevent imminent or life-threatening deterioration.  Critical care was time spent personally by me on the following activities: development of treatment plan with patient and/or surrogate as well as nursing, discussions with consultants, evaluation of patient's response to treatment, examination of patient, obtaining history from patient or surrogate, ordering and performing treatments and interventions, ordering and review of laboratory studies, ordering and review of radiographic studies, pulse oximetry and re-evaluation of patient's condition.   Procedures    MEDICATIONS ORDERED IN ED: Medications  iohexol (OMNIPAQUE) 350 MG/ML injection 100 mL (100 mLs Intravenous Contrast Given 09/28/23 0033)      IMPRESSION / MDM / ASSESSMENT AND PLAN / ED COURSE  I reviewed the triage vital signs and the nursing notes.                              Differential diagnosis includes, but is not limited to, CVA, TIA, infection, intracranial bleed  Patient's presentation is most consistent with acute presentation with potential threat to life or bodily function.   The patient is on the cardiac monitor to evaluate for evidence of arrhythmia and/or significant heart rate changes.  Patient presented to the emergency department today because of concerns for slurred speech and extremity weakness.  Patient was called a code stroke.  Neurology did not feel patient warranted TNK at this time.  Imaging without evidence of acute stroke, however raised possibility of pneumonia. Blood work with elevated WBC. At this time do think pneumonia likely and could be causing recrudescence of patient's symptoms. Discussed with Dr. Para March with the hospitalist service who will evaluate for admission.      FINAL CLINICAL IMPRESSION(S) / ED DIAGNOSES   Final diagnoses:  Pneumonia due to infectious organism, unspecified laterality, unspecified part of lung        Note:  This document was prepared using Dragon voice recognition software and may include unintentional dictation errors.    Phineas Semen, MD 09/28/23 [phone]

## 2023-09-28 NOTE — Progress Notes (Addendum)
0630: Tele stroke cart activated at this time. Pt in Ct at time of activation.   1601: TSP paged at this time.   0049: Pt returned from CT.   0053: TSP on cart at this time to assess patient. Results of NCCT provided.   0106: TSP and TSRN  off cart at this time. TSP to follow up with EDP for plan of care.   0115: Results of Advanced imaging provided to TSP at this time.

## 2023-09-28 NOTE — Consult Note (Signed)
NEURO HOSPITALIST CONSULT NOTE   Requestig physician: Dr. Joylene Igo  Reason for Consult: Encephalopathy  History obtained from:  Patient and Chart     HPI:                                                                                                                                          Rachel Nielsen is an 72 y.o. female with a PMHx of HTN, HLD, MI, prior strokes with bilateral chronic weakness, sleep apnea and COPD, who presented to the ED shortly after midnight for evaluation of worsened slurred speech and generalized weakness upon awaking from sleep. Code Stroke was called. She was evaluated by Teleneurology with NIHSS of 3 for BLE drift and slurred speech. Although speech was objectively slurred, the patient reported that she was speaking the way she normally does. She was not a thrombolytic candidate due to likely chronic deficits and no evidence of new lateralizing deficits. Head CT was negative for acute abnormalities. CTA/CTP was negative for LVO. Patchy lung infiltrates were concerning for PNA. The patient was admitted for encephalopathy work up and stroke rule out.   At baseline, she uses a walker to ambulate. Home medications include ASA, Plavix and rosuvastatin.   Past Medical History:  Diagnosis Date   Allergy    Anxiety    Asthma    Colon polyps    Constipation    COPD (chronic obstructive pulmonary disease) (HCC)    Depression    GERD (gastroesophageal reflux disease)    Glaucoma    Heart attack (HCC)    Hypertension    Sleep apnea    Sleep apnea    Stroke [facility])    Urinary incontinence     Past Surgical History:  Procedure Laterality Date   APPENDECTOMY     CHOLECYSTECTOMY     COLONOSCOPY WITH PROPOFOL N/A 07/20/2018   Procedure: COLONOSCOPY WITH PROPOFOL;  Surgeon: Toledo, Boykin Nearing, MD;  Location: [facility] ENDOSCOPY;  Service: Gastroenterology;  Laterality: N/A;   ESOPHAGOGASTRODUODENOSCOPY N/A 07/20/2018   Procedure:  ESOPHAGOGASTRODUODENOSCOPY (EGD);  Surgeon: Toledo, Boykin Nearing, MD;  Location: [facility] ENDOSCOPY;  Service: Gastroenterology;  Laterality: N/A;   GASTRECTOMY     TUBAL LIGATION     VAGINAL HYSTERECTOMY      Family History  Problem Relation Age of Onset   Hypertension Mother    Atrial fibrillation Mother    Heart attack Mother    Hypertension Father    Stomach cancer Father    Diabetes Father    Liver cancer Father    Diabetes Sister    Hypertension Sister              Social History:  reports that she has quit smoking. She has never used smokeless tobacco. She reports that she  does not currently use alcohol. She reports that she does not use drugs.  Allergies  Allergen Reactions   Ace Inhibitors Cough    MEDICATIONS:                                                                                                                     No current facility-administered medications on file prior to encounter.   Current Outpatient Medications on File Prior to Encounter  Medication Sig Dispense Refill   ACCU-CHEK GUIDE test strip Use to check blood sugar up to twice a day as directed 200 strip 2   Accu-Chek Softclix Lancets lancets Use to check blood sugar up to twice a day as directed 200 each 2   albuterol (PROVENTIL) (2.5 MG/3ML) 0.083% nebulizer solution Inhale 3 mLs (2.5 mg total) into the lungs every 4 (four) hours as needed for wheezing or shortness of breath. (Patient taking differently: Inhale 2.5 mg into the lungs every 6 (six) hours as needed for wheezing or shortness of breath.) 75 mL 12   albuterol (VENTOLIN HFA) 108 (90 Base) MCG/ACT inhaler Inhale 2-4 puffs by mouth every 4 hours as needed for wheezing, cough, and/or shortness of breath (Patient taking differently: 2 puffs every 6 (six) hours as needed.) 6.7 g 0   ALPRAZolam (XANAX) 1 MG tablet Take 1 mg by mouth 3 (three) times daily as needed for anxiety or sleep.     amLODipine (NORVASC) 5 MG tablet Take 1 tablet (5 mg total)  by mouth daily. 90 tablet 0   Asenapine Maleate 10 MG SUBL Place 5 mg under the tongue at bedtime.     aspirin 81 MG EC tablet Take 1 tablet (81 mg total) by mouth daily. Swallow whole. 30 tablet 12   bisoprolol (ZEBETA) 10 MG tablet Take 1 tablet (10 mg total) by mouth daily. OFFICE VISIT NEEDED FOR ADDITIONAL REFILLS 90 tablet 1   buPROPion (WELLBUTRIN XL) 300 MG 24 hr tablet Take 1 tablet (300 mg total) by mouth daily. 30 tablet 0   busPIRone (BUSPAR) 15 MG tablet Take 15 mg by mouth 2 (two) times daily.     clopidogrel (PLAVIX) 75 MG tablet Take 75 mg by mouth daily.     docusate sodium (COLACE) 100 MG capsule Take 100 mg by mouth daily.     Dulaglutide 3 MG/0.5ML SOPN Inject 0.5 mLs (3 mg total) subcutaneously once a week     DULoxetine (CYMBALTA) 60 MG capsule Take 60 mg by mouth daily.     famotidine (PEPCID) 20 MG tablet Take 20 mg by mouth 2 (two) times daily.     FARXIGA 10 MG TABS tablet Take 10 mg by mouth daily.     fluticasone (FLONASE) 50 MCG/ACT nasal spray Place 1 spray into both nostrils daily. 48 g 1   fluticasone-salmeterol (ADVAIR) 250-50 MCG/ACT AEPB Inhale 1 puff into the lungs 2 (two) times daily.     gabapentin (NEURONTIN) 600 MG tablet Take 600-900 mg by mouth 3 (three) times  daily. 600 mg AM, 600 mg pm and 900 mg at bedtime     GLOBAL EASE INJECT PEN NEEDLES 32G X 4 MM MISC Use as directed with insulin 100 each 0   Insulin Glargine Solostar (LANTUS) 100 UNIT/ML Solostar Pen Inject 20 Units into the skin at bedtime. (Patient not taking: Reported on 02/12/2023)     Insulin Pen Needle (BD PEN NEEDLE NANO U/F) 32G X 4 MM MISC Inject 1 Device into the skin daily. 100 each 3   Insulin Syringe-Needle U-100 (INSULIN SYRINGE 1CC/31GX5/16") 31G X 5/16" 1 ML MISC 1 Syringe by Does not apply route in the morning, at noon, in the evening, and at bedtime. 200 each 3   lactulose (CHRONULAC) 10 GM/15ML solution Take 30 g by mouth 2 (two) times daily.     linaclotide (LINZESS) 290 MCG  CAPS capsule Take 1 capsule (290 mcg total) by mouth daily before breakfast. 90 capsule 1   lurasidone (LATUDA) 40 MG TABS tablet Take 40 mg by mouth daily. (Patient not taking: Reported on 02/12/2023)     meloxicam (MOBIC) 15 MG tablet Take 15 mg by mouth daily.     metFORMIN (GLUCOPHAGE) 1000 MG tablet Take 1,000 mg by mouth 2 (two) times daily with a meal.     mirabegron ER (MYRBETRIQ) 50 MG TB24 tablet Take 1 tablet (50 mg total) by mouth daily. 30 tablet 11   montelukast (SINGULAIR) 10 MG tablet Take 1 tablet (10 mg total) by mouth daily. 30 tablet 0   pantoprazole (PROTONIX) 40 MG tablet Take 1 tablet (40 mg total) by mouth 2 (two) times daily. 180 tablet 0   predniSONE (DELTASONE) 5 MG tablet Take 5 mg by mouth daily.     rosuvastatin (CRESTOR) 10 MG tablet Take 1 tablet (10 mg total) by mouth daily. 90 tablet 1   TRAVATAN Z 0.004 % SOLN ophthalmic solution Place 1 drop into both eyes at bedtime. 5 mL 0    Scheduled:  [START ON 09/29/2023]  stroke: early stages of recovery book   Does not apply Once   aspirin EC  81 mg Oral Daily   buPROPion  300 mg Oral Daily   enoxaparin (LOVENOX) injection  45 mg Subcutaneous Q24H   insulin aspart  0-20 Units Subcutaneous TID WC   insulin aspart  0-5 Units Subcutaneous QHS   potassium chloride  40 mEq Oral Once   rosuvastatin  10 mg Oral Daily   Continuous:  [START ON 09/29/2023] azithromycin     [START ON 09/29/2023] cefTRIAXone (ROCEPHIN)  IV     lactated ringers 150 mL/hr (09/28/23 0408)     ROS:                                                                                                                                       As per HPI. The patient does not endorse any additional complaints at the  time of neurology evaluation.    Blood pressure (!) 139/97, pulse 87, temperature 98.8 F (37.1 C), temperature source Oral, resp. rate 18, height 5\' 4"  (1.626 m), weight 93.7 kg, SpO2 95%.   General Examination:                                                                                                        Physical Exam HEENT- [place]/AT   Lungs- Respirations unlabored Extremities- Warm and well-perfused  Neurological Examination Mental Status: Awake and alert. Fully oriented except for the day of the week. Speech fluent with intact naming and comprehension. No dysarthria. Able to follow all commands without difficulty. Cranial Nerves: II: Temporal visual fields intact with no extinction to DSS. PERRL. III,IV, VI: No ptosis. EOMI. No nystagmus. V: Temp sensation equal bilaterally; subjectively decreased FT sensation bilaterally VII: Smile symmetric VIII: Hearing intact to voice IX,X: No hypophonia or hoarseness XI: Symmetric XII: Midline tongue extension Motor: RUE: 5/5 LUE: 5/5 RLE: 5/5 LLE: 5/5 Sensory: Temp and FT intact x 4. No extinction to DSS. Deep Tendon Reflexes: 2+ and symmetric bilateral biceps and brachioradialis. Hypoactive patellars.  Cerebellar: No ataxia with FNF bilaterally Gait: Deferred   Lab Results: Basic Metabolic Panel: Recent Labs  Lab 09/28/23 0058  NA 140  K 3.0*  CL 105  CO2 22  GLUCOSE 166*  BUN 15  CREATININE 1.05*  CALCIUM 7.9*    CBC: Recent Labs  Lab 09/28/23 0058  WBC 17.3*  NEUTROABS 13.1*  HGB 10.9*  HCT 37.3  MCV 77.7*  PLT 152    Cardiac Enzymes: No results for input(s): "CKTOTAL", "CKMB", "CKMBINDEX", "TROPONINI" in the last 168 hours.  Lipid Panel: Recent Labs  Lab 09/28/23 0628  CHOL 86  TRIG 43  HDL 42  CHOLHDL 2.0  VLDL 9  LDLCALC 35    Imaging: MR BRAIN WO CONTRAST  Result Date: 09/28/2023 CLINICAL DATA:  Neuro deficit with stroke suspected. Slurred speech. EXAM: MRI HEAD WITHOUT CONTRAST TECHNIQUE: Multiplanar, multiecho pulse sequences of the brain and surrounding structures were obtained without intravenous contrast. COMPARISON:  Head CT and CTA from earlier today. 05/13/2022 brain MRI FINDINGS: Brain: No acute infarction,  hemorrhage, hydrocephalus, extra-axial collection or mass lesion. The patients subcortical left frontal infarct now has an expected lacunar appearance. Minor ischemic gliosis in the cerebral white matter. Mild cerebral volume loss. Vascular: Normal flow voids. Skull and upper cervical spine: Normal marrow signal. Degenerative facet spurring in the upper cervical spine. Sinuses/Orbits: Negative. IMPRESSION: 1. No acute finding. 2. Small subcortical infarct in the left frontal white matter which occurred in 2023. No interval insult. Electronically Signed   By: Tiburcio Pea M.D.   On: 09/28/2023 04:52   DG Chest Portable 1 View  Result Date: 09/28/2023 CLINICAL DATA:  Clinical concern for pneumonia. EXAM: PORTABLE CHEST 1 VIEW COMPARISON:  Portable chest 05/13/2022 FINDINGS: The cardiac size is normal. Stable mediastinum with aortic atherosclerosis and tortuosity. There is a low inspiration on exam. There is a chronically elevated right hemidiaphragm. There is increased opacity in the hypoinflated right  lower lung field, greater medially and could be due to atelectasis, pneumonia or aspiration. A follow-up study is recommended in full inspiration. The remaining lungs are clear. There is no substantial pleural effusion.  Thoracic cage is intact. IMPRESSION: 1. Increased opacity in the hypoinflated right lower lung field, greater medially and could be due to atelectasis, pneumonia or aspiration. A follow-up PA Lat study is recommended in full inspiration. 2. Aortic atherosclerosis. Electronically Signed   By: Almira Bar M.D.   On: 09/28/2023 02:45   CT ANGIO HEAD NECK W WO CM W PERF (CODE STROKE)  Result Date: 09/28/2023 CLINICAL DATA:  Initial evaluation for neuro deficit, stroke. EXAM: CT ANGIOGRAPHY HEAD AND NECK CT PERFUSION BRAIN TECHNIQUE: Multidetector CT imaging of the head and neck was performed using the standard protocol during bolus administration of intravenous contrast. Multiplanar CT image  reconstructions and MIPs were obtained to evaluate the vascular anatomy. Carotid stenosis measurements (when applicable) are obtained utilizing NASCET criteria, using the distal internal carotid diameter as the denominator. Multiphase CT imaging of the brain was performed following IV bolus contrast injection. Subsequent parametric perfusion maps were calculated using RAPID software. RADIATION DOSE REDUCTION: This exam was performed according to the departmental dose-optimization program which includes automated exposure control, adjustment of the mA and/or kV according to patient size and/or use of iterative reconstruction technique. CONTRAST:  OMNIPAQUE IOHEXOL 350 MG/ML SOLN COMPARISON:  CT from earlier the same day. FINDINGS: CTA NECK FINDINGS Aortic arch: Examination degraded by motion artifact. Aortic arch within normal limits for caliber standard branch pattern. No stenosis about the origin the great vessels. Right carotid system: No evidence of dissection, stenosis (50% or greater) or occlusion. Left carotid system: No evidence of dissection, stenosis (50% or greater) or occlusion. Vertebral arteries: No evidence of dissection, stenosis (50% or greater) or occlusion. Skeleton: No discrete or worrisome osseous lesions. Moderate cervical spondylosis, most pronounced at C5-6 and C6-7. Other neck: No other acute finding. Upper chest: Patchy ground-glass opacity within the visualized right lung, concerning for pneumonia. Review of the MIP images confirms the above findings CTA HEAD FINDINGS Anterior circulation: Both internal carotid arteries widely patent to the termini without stenosis. A1 segments widely patent. Normal anterior communicating artery complex. Both anterior cerebral arteries widely patent to their distal aspects without stenosis. No M1 stenosis or occlusion. Normal MCA bifurcations. Distal MCA branches well perfused and symmetric. Posterior circulation: Both vertebral arteries patent to the  vertebrobasilar junction without stenosis. Right PICA patent. Left PICA not well seen. Basilar patent without stenosis. Superior cerebral arteries patent bilaterally. Both PCA supplied via the basilar as well as small bilateral posterior communicating arteries. Both PCAs patent to their distal aspects without stenosis. Venous sinuses: Patent allowing for timing the contrast bolus. Anatomic variants: As above.  No aneurysm. Review of the MIP images confirms the above findings CT Brain Perfusion Findings: ASPECTS: 10 CBF (<30%) Volume: 0mL Perfusion (Tmax>6.0s) volume: 0mL Mismatch Volume: 0mL Infarction Location:Negative CT perfusion with no evidence for acute ischemia or other perfusion abnormality. IMPRESSION: 1. Negative CTA of the head and neck. No large vessel occlusion or other emergent finding. 2. Negative CT perfusion with no evidence for acute ischemia or other perfusion abnormality. 3. Patchy ground-glass opacity within the visualized right lung, concerning for pneumonia. Electronically Signed   By: Rise Mu M.D.   On: 09/28/2023 01:10   CT HEAD CODE STROKE WO CONTRAST  Result Date: 09/28/2023 CLINICAL DATA:  Code stroke. Initial evaluation for neuro deficit, stroke  suspected. EXAM: CT HEAD WITHOUT CONTRAST TECHNIQUE: Contiguous axial images were obtained from the base of the skull through the vertex without intravenous contrast. RADIATION DOSE REDUCTION: This exam was performed according to the departmental dose-optimization program which includes automated exposure control, adjustment of the mA and/or kV according to patient size and/or use of iterative reconstruction technique. COMPARISON:  CT from 05/20/2023. FINDINGS: Brain: Cerebral volume within normal limits. No acute intracranial hemorrhage. No acute large vessel territory infarct. Subcortical hypodensity at the left frontal lobe noted, either a chronic lacunar infarct or dilated perivascular space. No mass lesion, midline shift or  mass effect. No hydrocephalus or extra-axial fluid collection. Vascular: No abnormal hyperdense vessel. Focal atherosclerotic calcification involving the right M1 segment noted, stable. Skull: Scalp soft tissues demonstrate no acute finding. Calvarium intact. Sinuses/Orbits: Globes orbital soft tissues within normal limits. Paranasal sinuses and mastoid air cells are clear. Other: None. ASPECTS [facility] Stroke Program Early CT Score) - Ganglionic level infarction (caudate, lentiform nuclei, internal capsule, insula, M1-M3 cortex): 7 - Supraganglionic infarction (M4-M6 cortex): 3 Total score (0-10 with 10 being normal): 10 IMPRESSION: 1. No acute intracranial abnormality. 2. Aspects is 10. Results were called by telephone at the time of interpretation on 09/28/2023 at 12:42 am to provider [facility] , who verbally acknowledged these results. Electronically Signed   By: Rise Mu M.D.   On: 09/28/2023 00:42     Assessment: 72 year old female presenting with acute encephalopathy - Exam reveals no focal neurological deficit other than equivocal/subjective bilateral facial numbness.   - MRI brain:  No acute finding. Small subcortical infarct in the left frontal white matter which occurred in 2023. No interval insult. - Infectious work up: - WBC and lactate were elevated in the ED.  - CXR revealed increased opacity in the hypoinflated right lower lung field, greater medially and could be due to atelectasis, pneumonia or aspiration.  - Overall impression: Acute metabolic encephalopathy in the setting of probable PNA.    Recommendations: - TTE is pending. Most likely will be low-yield given that stroke has been ruled out with MRI.  - Management of probable PNA per primary team.  - Neurohospitalist service will follow PRN. Please call us if there are any findings of concern on her TTE.    Electronically signed: Dr. Caryl Pina 09/28/2023, 8:47 AM

## 2023-09-28 NOTE — Assessment & Plan Note (Signed)
CPAP nightly if using at home

## 2023-09-28 NOTE — Assessment & Plan Note (Addendum)
Sepsis Patient was tachycardic, tachypneic with leukocytosis 17,000, lactic acidosis pneumonia seen on imaging Patient with leukocytosis and groundglass opacities on CTA head and neck Continue Rocephin and azithromycin Received IV fluid bolus in the ED Follow respiratory viral panel

## 2023-09-28 NOTE — ED Notes (Signed)
Speech therapy working with patient.

## 2023-09-28 NOTE — ED Notes (Signed)
Teleneurology called spoke with Naeem  per Dr. Derrill Kay for code stroke using  chart 2

## 2023-09-28 NOTE — Assessment & Plan Note (Signed)
Not acutely exacerbated Continue home inhalers DuoNeb as needed

## 2023-09-28 NOTE — Progress Notes (Signed)
Patient with a history of prior CVA with right-sided weakness and dysarthria admitted to the hospital for concerns of an acute stroke.  Stroke workup is negative and CT angiogram of the head and neck shows patchy ground-glass opacity within the visualized right lung, concerning for pneumonia.  Continue empiric antibiotic therapy with Rocephin and Zithromax. Appreciate therapy input, no evidence of aspiration but may have silent reflux related to GERD. Patient started on PPI For discharge in a.m. with home health

## 2023-09-28 NOTE — Evaluation (Signed)
Physical Therapy Evaluation Patient Details Name: Rachel Nielsen MRN: [id number] DOB: [birth year] Today's Date: 09/28/2023  History of Present Illness  Rachel Nielsen is a 72 y.o. female with medical history significant for COPD,  hypertension, anxiety, OSA, CAD and multiple previous stroke with residual left-sided weakness, dependent on a walker, who presented as a code stroke after reportedly awaking from sleep with slurred speech and generalized weakness. Pt admitted for stroke workup. CT/MRI negative for acute findings.   Clinical Impression  Pt alert, agreeable to PT, seated on EOB with RN. Pt reported at baseline she is modI for ADLs, uses rollator in her home and cane for community ambulation though is limited in her distances due to fatigue. Upon assessment she demonstrated BLE strength 4/5, denied sensation deficits of BLE and symmetrical coordination (though very slow, effortful bilaterally). Also able to don her socks EOB with supervision. Sit <> stand with RW and CGA, cued for hand placement. She ambulated ~176ft without LOB, decreased gait velocity.  Overall the patient demonstrated deficits (see "PT Problem List") that impede the patient's functional abilities, safety, and mobility and would benefit from skilled PT intervention.          If plan is discharge home, recommend the following: Assistance with cooking/housework;Assist for transportation;Help with stairs or ramp for entrance;Direct supervision/assist for medications management   Can travel by private vehicle        Equipment Recommendations None recommended by PT  Recommendations for Other Services       Functional Status Assessment Patient has had a recent decline in their functional status and demonstrates the ability to make significant improvements in function in a reasonable and predictable amount of time.     Precautions / Restrictions Precautions Precautions: Fall Restrictions Weight Bearing Restrictions: No       Mobility  Bed Mobility Overal bed mobility: Needs Assistance Bed Mobility: Sit to Supine       Sit to supine: Contact guard assist        Transfers Overall transfer level: Needs assistance Equipment used: Rolling walker (2 wheels) Transfers: Sit to/from Stand Sit to Stand: Contact guard assist                Ambulation/Gait Ambulation/Gait assistance: Contact guard assist Gait Distance (Feet): 100 Feet Assistive device: Rolling walker (2 wheels)         General Gait Details: slow, no LOB reciprocal gait pattern  Stairs            Wheelchair Mobility     Tilt Bed    Modified Rankin (Stroke Patients Only)       Balance Overall balance assessment: Needs assistance Sitting-balance support: No upper extremity supported, Feet supported Sitting balance-Leahy Scale: Fair     Standing balance support: Reliant on assistive device for balance, During functional activity, Bilateral upper extremity supported Standing balance-Leahy Scale: Fair                               Pertinent Vitals/Pain Pain Assessment Pain Assessment: No/denies pain    Home Living Family/patient expects to be discharged to:: Private residence Living Arrangements: Children Available Help at Discharge: Family;Available 24 hours/day Type of Home: House Home Access: Stairs to enter Entrance Stairs-Rails: None Entrance Stairs-Number of Steps: Threshold at back entrance   Home Layout: One level Home Equipment: Rollator (4 wheels);Cane - single point      Prior Function Prior Level of Function :  History of Falls (last six months);Needs assist             Mobility Comments: Uses SPC for car transfers, 4WW for household mobility. Walker can't fit through back door. several falls in the last 6 months. ADLs Comments: MOD I for ADL, but has noticed a decline in the last few weeks.     Extremity/Trunk Assessment   Upper Extremity Assessment Upper  Extremity Assessment: Defer to OT evaluation    Lower Extremity Assessment Lower Extremity Assessment:  (grossly 4/5 for BLE, denied sensation changes, slow coordination but symmetrical)    Cervical / Trunk Assessment Cervical / Trunk Assessment: Normal  Communication   Communication Communication: No apparent difficulties Cueing Techniques: Verbal cues  Cognition Arousal: Alert Behavior During Therapy: WFL for tasks assessed/performed Overall Cognitive Status: Within Functional Limits for tasks assessed                                 General Comments: Pleasant, conversational, oriented to self, place, situation. Did not assess for date. Follows VCs consistently during session.        General Comments General comments (skin integrity, edema, etc.): VSS t/o session.    Exercises     Assessment/Plan    PT Assessment Patient needs continued PT services  PT Problem List Decreased activity tolerance;Decreased mobility       PT Treatment Interventions DME instruction;Neuromuscular re-education;Gait training;Stair training;Patient/family education;Functional mobility training;Therapeutic activities;Therapeutic exercise;Balance training    PT Goals (Current goals can be found in the Care Plan section)  Acute Rehab PT Goals Patient Stated Goal: to go home PT Goal Formulation: With patient Time For Goal Achievement: 10/12/23 Potential to Achieve Goals: Good    Frequency Min 1X/week     Co-evaluation               AM-PAC PT "6 Clicks" Mobility  Outcome Measure Help needed turning from your back to your side while in a flat bed without using bedrails?: None Help needed moving from lying on your back to sitting on the side of a flat bed without using bedrails?: None Help needed moving to and from a bed to a chair (including a wheelchair)?: None Help needed standing up from a chair using your arms (e.g., wheelchair or bedside chair)?: A Little Help  needed to walk in hospital room?: A Little Help needed climbing 3-5 steps with a railing? : A Little 6 Click Score: 21    End of Session Equipment Utilized During Treatment: Gait belt Activity Tolerance: Patient tolerated treatment well Patient left: in bed;with call bell/phone within reach;with bed alarm set Nurse Communication: Mobility status PT Visit Diagnosis: Other abnormalities of gait and mobility (R26.89);Difficulty in walking, not elsewhere classified (R26.2);Muscle weakness (generalized) (M62.81)    Time: 4098-1191 PT Time Calculation (min) (ACUTE ONLY): 18 min   Charges:   PT Evaluation $PT Eval Low Complexity: 1 Low PT Treatments $Therapeutic Activity: 8-22 mins PT General Charges $$ ACUTE PT VISIT: 1 Visit         Olga Coaster PT, DPT 2:24 PM,09/28/23

## 2023-09-29 ENCOUNTER — Observation Stay: Payer: Medicare HMO

## 2023-09-29 DIAGNOSIS — J189 Pneumonia, unspecified organism: Secondary | ICD-10-CM | POA: Diagnosis not present

## 2023-09-29 DIAGNOSIS — G9341 Metabolic encephalopathy: Secondary | ICD-10-CM | POA: Diagnosis not present

## 2023-09-29 DIAGNOSIS — Z8673 Personal history of transient ischemic attack (TIA), and cerebral infarction without residual deficits: Secondary | ICD-10-CM | POA: Diagnosis not present

## 2023-09-29 DIAGNOSIS — R531 Weakness: Secondary | ICD-10-CM

## 2023-09-29 DIAGNOSIS — R9389 Abnormal findings on diagnostic imaging of other specified body structures: Secondary | ICD-10-CM | POA: Diagnosis not present

## 2023-09-29 DIAGNOSIS — R918 Other nonspecific abnormal finding of lung field: Secondary | ICD-10-CM | POA: Diagnosis not present

## 2023-09-29 LAB — CBC
HCT: 33.7 % — ABNORMAL LOW (ref 36.0–46.0)
Hemoglobin: 10.1 g/dL — ABNORMAL LOW (ref 12.0–15.0)
MCH: 22.3 pg — ABNORMAL LOW (ref 26.0–34.0)
MCHC: 30 g/dL (ref 30.0–36.0)
MCV: 74.6 fL — ABNORMAL LOW (ref 80.0–100.0)
Platelets: 127 10*3/uL — ABNORMAL LOW (ref 150–400)
RBC: 4.52 MIL/uL (ref 3.87–5.11)
RDW: 17.5 % — ABNORMAL HIGH (ref 11.5–15.5)
WBC: 8.3 10*3/uL (ref 4.0–10.5)
nRBC: 0 % (ref 0.0–0.2)

## 2023-09-29 LAB — BASIC METABOLIC PANEL
Anion gap: 9 (ref 5–15)
BUN: 11 mg/dL (ref 8–23)
CO2: 26 mmol/L (ref 22–32)
Calcium: 8.5 mg/dL — ABNORMAL LOW (ref 8.9–10.3)
Chloride: 104 mmol/L (ref 98–111)
Creatinine, Ser: 0.77 mg/dL (ref 0.44–1.00)
GFR, Estimated: >60 mL/min (ref >60)
Glucose, Bld: 139 mg/dL — ABNORMAL HIGH (ref 70–99)
Potassium: 3.3 mmol/L — ABNORMAL LOW (ref 3.5–5.1)
Sodium: 139 mmol/L (ref 135–145)

## 2023-09-29 LAB — GLUCOSE, CAPILLARY
Glucose-Capillary: 116 mg/dL — ABNORMAL HIGH (ref 70–99)
Glucose-Capillary: 147 mg/dL — ABNORMAL HIGH (ref 70–99)
Glucose-Capillary: 126 mg/dL — ABNORMAL HIGH (ref 70–99)

## 2023-09-29 LAB — PROCALCITONIN: Procalcitonin: 0.12 ng/mL

## 2023-09-29 MED ORDER — AZITHROMYCIN 500 MG PO TABS: 500.0000 mg | ORAL_TABLET | Freq: Every day | ORAL | 0 refills | Status: AC

## 2023-09-29 MED ORDER — POTASSIUM CHLORIDE CRYS ER 20 MEQ PO TBCR
40.0000 meq | EXTENDED_RELEASE_TABLET | Freq: Once | ORAL | Status: AC
  Administered 2023-09-29: 40 meq via ORAL
  Filled 2023-09-29: qty 2

## 2023-09-29 MED ORDER — ALPRAZOLAM 1 MG PO TABS
1.0000 mg | ORAL_TABLET | Freq: Once | ORAL | Status: AC
  Administered 2023-09-29: 1 mg via ORAL
  Filled 2023-09-29: qty 1

## 2023-09-29 MED ORDER — CEFDINIR 300 MG PO CAPS: 300.0000 mg | ORAL_CAPSULE | Freq: Two times a day (BID) | ORAL | 0 refills | Status: AC

## 2023-09-29 NOTE — Progress Notes (Signed)
Mobility Specialist - Progress Note   09/29/23 1415  Mobility  Activity Ambulated with assistance in hallway  Level of Assistance Contact guard assist, steadying assist  Assistive Device Front wheel walker  Distance Ambulated (ft) 80 ft  Activity Response Tolerated well  $Mobility charge 1 Mobility  Mobility Specialist Start Time (ACUTE ONLY) 1358  Mobility Specialist Stop Time (ACUTE ONLY) 1409  Mobility Specialist Time Calculation (min) (ACUTE ONLY) 11 min   Pt supine upon entry, utilizing RA. Pt agreeable to OOB amb in the hallway this date. Pt denies pain, expressed "I'm feeling much better". Pt completed bed mob ModI, STS to RW MinA and amb a self selected 80 ft in the hallway CGA-- slow gait. Pt returned to the room, left supine with alarm set and needs within reach.  Zetta Bills Mobility Specialist 09/29/23 2:30 PM

## 2023-09-29 NOTE — TOC Progression Note (Addendum)
Transition of Care [facility]) - Progression Note    Patient Details  Name: Rachel Nielsen MRN: [id number] Date of Birth: 1951-03-03  [facility] CM/SW Contact  Allena Katz, LCSW Phone Number: 09/29/2023, 11:15 AM  Clinical Narrative:   CSW spoke with patient about HH. Pt prefers [facility]. Message sent to [facility] with [facility]. Pt reports she is active with PCP Manson Allan and has all needed DME at home. Pt reports no additional TOC concerns at this time.  11:19AM Referral accepted by Adelina Mings with [facility].       Expected Discharge Plan and Services                                               Social Determinants of Health (SDOH) Interventions SDOH Screenings   Food Insecurity: No Food Insecurity (09/28/2023)  Housing: Low Risk  (09/28/2023)  Transportation Needs: No Transportation Needs (09/28/2023)  Utilities: Not At Risk (09/28/2023)  Alcohol Screen: Low Risk  (12/25/2022)  Depression (PHQ2-9): High Risk (02/12/2023)  Financial Resource Strain: Low Risk  (09/13/2023)   Received from [facility]  Physical Activity: Inactive (12/25/2022)  Social Connections: Socially Isolated (12/25/2022)  Stress: No Stress Concern Present (12/25/2022)  Tobacco Use: Medium Risk (09/28/2023)    Readmission Risk Interventions     No data to display

## 2023-09-29 NOTE — Plan of Care (Signed)
  Problem: Education: Goal: Ability to describe self-care measures that may prevent or decrease complications (Diabetes Survival Skills Education) will improve Outcome: Progressing Goal: Individualized Educational Video(s) Outcome: Progressing   Problem: Coping: Goal: Ability to adjust to condition or change in health will improve Outcome: Progressing   Problem: Fluid Volume: Goal: Ability to maintain a balanced intake and output will improve Outcome: Progressing   Problem: Health Behavior/Discharge Planning: Goal: Ability to identify and utilize available resources and services will improve Outcome: Progressing Goal: Ability to manage health-related needs will improve Outcome: Progressing   Problem: Metabolic: Goal: Ability to maintain appropriate glucose levels will improve Outcome: Progressing   Problem: Nutritional: Goal: Maintenance of adequate nutrition will improve Outcome: Progressing Goal: Progress toward achieving an optimal weight will improve Outcome: Progressing   Problem: Skin Integrity: Goal: Risk for impaired skin integrity will decrease Outcome: Progressing   Problem: Tissue Perfusion: Goal: Adequacy of tissue perfusion will improve Outcome: Progressing   Problem: Fluid Volume: Goal: Hemodynamic stability will improve Outcome: Progressing   Problem: Clinical Measurements: Goal: Diagnostic test results will improve Outcome: Progressing Goal: Signs and symptoms of infection will decrease Outcome: Progressing   Problem: Respiratory: Goal: Ability to maintain adequate ventilation will improve Outcome: Progressing   Problem: Education: Goal: Knowledge of disease or condition will improve Outcome: Progressing Goal: Knowledge of secondary prevention will improve (MUST DOCUMENT ALL) Outcome: Progressing Goal: Knowledge of patient specific risk factors will improve Loraine Leriche N/A or DELETE if not current risk factor) Outcome: Progressing   Problem:  Ischemic Stroke/TIA Tissue Perfusion: Goal: Complications of ischemic stroke/TIA will be minimized Outcome: Progressing   Problem: Coping: Goal: Will verbalize positive feelings about self Outcome: Progressing Goal: Will identify appropriate support needs Outcome: Progressing   Problem: Health Behavior/Discharge Planning: Goal: Ability to manage health-related needs will improve Outcome: Progressing Goal: Goals will be collaboratively established with patient/family Outcome: Progressing   Problem: Self-Care: Goal: Ability to participate in self-care as condition permits will improve Outcome: Progressing Goal: Verbalization of feelings and concerns over difficulty with self-care will improve Outcome: Progressing Goal: Ability to communicate needs accurately will improve Outcome: Progressing   Problem: Nutrition: Goal: Risk of aspiration will decrease Outcome: Progressing Goal: Dietary intake will improve Outcome: Progressing   Problem: Education: Goal: Knowledge of General Education information will improve Description: Including pain rating scale, medication(s)/side effects and non-pharmacologic comfort measures Outcome: Progressing   Problem: Health Behavior/Discharge Planning: Goal: Ability to manage health-related needs will improve Outcome: Progressing   Problem: Clinical Measurements: Goal: Ability to maintain clinical measurements within normal limits will improve Outcome: Progressing Goal: Will remain free from infection Outcome: Progressing Goal: Diagnostic test results will improve Outcome: Progressing Goal: Respiratory complications will improve Outcome: Progressing Goal: Cardiovascular complication will be avoided Outcome: Progressing   Problem: Activity: Goal: Risk for activity intolerance will decrease Outcome: Progressing   Problem: Nutrition: Goal: Adequate nutrition will be maintained Outcome: Progressing   Problem: Coping: Goal: Level of  anxiety will decrease Outcome: Progressing   Problem: Elimination: Goal: Will not experience complications related to bowel motility Outcome: Progressing Goal: Will not experience complications related to urinary retention Outcome: Progressing   Problem: Pain Management: Goal: General experience of comfort will improve Outcome: Progressing   Problem: Safety: Goal: Ability to remain free from injury will improve Outcome: Progressing   Problem: Skin Integrity: Goal: Risk for impaired skin integrity will decrease Outcome: Progressing

## 2023-09-29 NOTE — Discharge Summary (Addendum)
Physician Discharge Summary   Patient: Rachel Nielsen MRN: [id number] DOB: [birth year]  Admit date:     09/28/2023  Discharge date: 09/29/2023  Discharge Physician: Pennie Banter   PCP: Alm Bustard, NP   Recommendations at discharge:    Follow up with Primary Care in 1-2 weeks Repeat CBC, BMP at follow up  Discharge Diagnoses: Principal Problem:   CAP (community acquired pneumonia) Active Problems:   Acute focal neurological deficit   History of CVA (cerebrovascular accident)   Generalized weakness   Hypertension   Anxiety and depression   OSA (obstructive sleep apnea)   COPD (chronic obstructive pulmonary disease) (HCC)  Resolved Problems:   Acute metabolic encephalopathy   Hypokalemia  Hospital Course: HPI on admission : "Rachel Nielsen is a 72 y.o. female with medical history significant for COPD,  hypertension, anxiety, OSA, CAD and multiple previous stroke with residual left-sided weakness, dependent on a walker, who presented as a code stroke after reportedly awaking from sleep with slurred speech and generalized weakness.  Initial CT head was negative for acute bleed and she was seen in consultation by teleneurology who recommended admission for encephalopathy workup and stroke rule out. ..." See H&P for full HPI on admission & ED course.  Further hospital course and management as outlined below.   11/28 --- pt doing well and at her functional baseline with mobility and speech.  She is medically stable and requests to be discharged home today with evaluation completed.   Assessment and Plan:  CAP (community acquired pneumonia) Severe Sepsis - tachycardic, tachypneic with leukocytosis 17,000, lactic acidosis with pneumonia seen on imaging No hypoxia or complicating factors.   Infection likely explains pt's increased generalized weakness which has improved since anbitiocs were initiated. --Treated with empiric Rocephin and azithromycin --Discharge on PO  azithromycin and cefdinir to complete 5 days    Acute metabolic encephalopathy - suspect due to PNA Acute neurologic deficit with history of multiple strokes - resolved Patient came in as a code stroke, with generalized weakness and slurred speech seen by teleneuro CT head and CTA head and neck negative for acute stroke Suspect acute encephalopathy related to pneumonia MRI brain --- no acute findings Continue antiplatelets and statins PT eval - HH recommended, arranged per TOC   Generalized weakness Secondary to pneumonia, hypokalemia PT eval   Hypokalemia - replaced Repeat BMP at follow up   COPD (chronic obstructive pulmonary disease) (HCC) Not acutely exacerbated Continue home inhalers DuoNeb as needed   OSA (obstructive sleep apnea) CPAP nightly if using at home   Anxiety and depression Continue Cymbalta, bupropion   Hypertension Hold bisoprolol and amlodipine for permissive hypertension       Consultants: neurology Procedures performed: as above  Disposition: Home health Diet recommendation:  Discharge Diet Orders (From admission, onward)     Start     Ordered   09/29/23 0000  Diet - low sodium heart healthy        09/29/23 1614            DISCHARGE MEDICATION: Allergies as of 09/29/2023       Reactions   Ace Inhibitors Cough        Medication List     TAKE these medications    Accu-Chek Guide test strip Generic drug: glucose blood Use to check blood sugar up to twice a day as directed   Accu-Chek Softclix Lancets lancets Use to check blood sugar up to twice a day as directed  albuterol 108 (90 Base) MCG/ACT inhaler Commonly known as: VENTOLIN HFA Inhale 2-4 puffs by mouth every 4 hours as needed for wheezing, cough, and/or shortness of breath What changed:  how much to take when to take this reasons to take this additional instructions   albuterol (2.5 MG/3ML) 0.083% nebulizer solution Commonly known as: PROVENTIL Inhale 3  mLs (2.5 mg total) into the lungs every 4 (four) hours as needed for wheezing or shortness of breath. What changed: when to take this   ALPRAZolam 1 MG tablet Commonly known as: XANAX Take 1 mg by mouth 3 (three) times daily as needed for anxiety or sleep.   amLODipine 5 MG tablet Commonly known as: NORVASC Take 1 tablet (5 mg total) by mouth daily.   Asenapine Maleate 10 MG Subl Place 10 mg under the tongue at bedtime.   aspirin EC 81 MG tablet Take 1 tablet (81 mg total) by mouth daily. Swallow whole.   azithromycin 500 MG tablet Commonly known as: Zithromax Take 1 tablet (500 mg total) by mouth daily for 3 days.   bisoprolol 10 MG tablet Commonly known as: ZEBETA Take 1 tablet (10 mg total) by mouth daily. OFFICE VISIT NEEDED FOR ADDITIONAL REFILLS   brimonidine 0.2 % ophthalmic solution Commonly known as: ALPHAGAN 1 drop 3 (three) times daily.   buPROPion 300 MG 24 hr tablet Commonly known as: WELLBUTRIN XL Take 1 tablet (300 mg total) by mouth daily.   busPIRone 15 MG tablet Commonly known as: BUSPAR Take 15 mg by mouth 2 (two) times daily.   cefdinir 300 MG capsule Commonly known as: OMNICEF Take 1 capsule (300 mg total) by mouth 2 (two) times daily for 3 days.   clopidogrel 75 MG tablet Commonly known as: PLAVIX Take 75 mg by mouth daily.   divalproex 250 MG DR tablet Commonly known as: DEPAKOTE Take 250 mg by mouth 2 (two) times daily.   docusate sodium 100 MG capsule Commonly known as: COLACE Take 100 mg by mouth daily.   dorzolamide-timolol 2-0.5 % ophthalmic solution Commonly known as: COSOPT Place 1 drop into both eyes 2 (two) times daily.   Dulaglutide 3 MG/0.5ML Soaj Inject 0.5 mLs (3 mg total) subcutaneously once a week   famotidine 20 MG tablet Commonly known as: PEPCID Take 20 mg by mouth 2 (two) times daily.   Farxiga 10 MG Tabs tablet Generic drug: dapagliflozin propanediol Take 10 mg by mouth daily.   fluticasone 50 MCG/ACT nasal  spray Commonly known as: FLONASE Place 1 spray into both nostrils daily.   fluticasone-salmeterol 250-50 MCG/ACT Aepb Commonly known as: ADVAIR Inhale 1 puff into the lungs 2 (two) times daily.   gabapentin 600 MG tablet Commonly known as: NEURONTIN Take 600-900 mg by mouth 3 (three) times daily. 600 mg AM, 600 mg pm and 900 mg at bedtime   Global Ease Inject Pen Needles 32G X 4 MM Misc Generic drug: Insulin Pen Needle Use as directed with insulin   BD Pen Needle Nano U/F 32G X 4 MM Misc Generic drug: Insulin Pen Needle Inject 1 Device into the skin daily.   INSULIN SYRINGE 1CC/31GX5/16" 31G X 5/16" 1 ML Misc 1 Syringe by Does not apply route in the morning, at noon, in the evening, and at bedtime.   Linzess 290 MCG Caps capsule Generic drug: linaclotide Take 1 capsule (290 mcg total) by mouth daily before breakfast.   metFORMIN 1000 MG tablet Commonly known as: GLUCOPHAGE Take 1,000 mg by mouth 2 (two)  times daily with a meal.   montelukast 10 MG tablet Commonly known as: SINGULAIR Take 1 tablet (10 mg total) by mouth daily.   Myrbetriq 25 MG Tb24 tablet Generic drug: mirabegron ER Take 25 mg by mouth daily.   pantoprazole 40 MG tablet Commonly known as: PROTONIX Take 1 tablet (40 mg total) by mouth 2 (two) times daily.   pregabalin 75 MG capsule Commonly known as: LYRICA Take 75 mg by mouth 3 (three) times daily.   rosuvastatin 10 MG tablet Commonly known as: CRESTOR Take 1 tablet (10 mg total) by mouth daily.   Travatan Z 0.004 % Soln ophthalmic solution Generic drug: Travoprost (BAK Free) Place 1 drop into both eyes at bedtime.        Follow-up Information     Fields, Lisabeth Pick, NP Follow up.   Specialty: Family Medicine Contact information: [address] [place] [address] [phone]                Discharge Exam: Ceasar Mons Weights   09/28/23 0056  Weight: 93.7 kg   General exam: awake, alert, no acute distress HEENT: moist  mucus membranes, hearing grossly normal  Respiratory system: CTAB mildly diminished, no wheezes, rales or rhonchi, normal respiratory effort. Cardiovascular system: normal S1/S2, RRR, no JVD, murmurs, rubs, gallops, no pedal edema.   Gastrointestinal system: soft, NT, ND, no HSM felt, +bowel sounds. Central nervous system: A&O x 3. no gross focal neurologic deficits, dysarthric speech Extremities: moves all, no edema, normal tone Skin: dry, intact, normal temperature, normal color, No rashes, lesions or ulcers Psychiatry: normal mood, congruent affect, judgement and insight appear normal   Condition at discharge: stable  The results of significant diagnostics from this hospitalization (including imaging, microbiology, ancillary and laboratory) are listed below for reference.   Imaging Studies: DG Chest 2 View  Result Date: 09/29/2023 CLINICAL DATA:  Community acquired pneumonia EXAM: CHEST - 2 VIEW COMPARISON:  09/28/2023 FINDINGS: Frontal and lateral views of the chest demonstrate a stable cardiac silhouette. Chronic elevation of the right hemidiaphragm. Patchy right lower lobe consolidation is seen posteriorly on lateral view, which may reflect atelectasis or airspace disease. No effusion or pneumothorax. No acute bony abnormalities. IMPRESSION: 1. Patchy right lower lobe consolidation involving the posterior costophrenic angle, which may reflect atelectasis or right lower lobe bronchopneumonia. Electronically Signed   By: Sharlet Salina M.D.   On: 09/29/2023 15:53   MR BRAIN WO CONTRAST  Result Date: 09/28/2023 CLINICAL DATA:  Neuro deficit with stroke suspected. Slurred speech. EXAM: MRI HEAD WITHOUT CONTRAST TECHNIQUE: Multiplanar, multiecho pulse sequences of the brain and surrounding structures were obtained without intravenous contrast. COMPARISON:  Head CT and CTA from earlier today. 05/13/2022 brain MRI FINDINGS: Brain: No acute infarction, hemorrhage, hydrocephalus, extra-axial  collection or mass lesion. The patients subcortical left frontal infarct now has an expected lacunar appearance. Minor ischemic gliosis in the cerebral white matter. Mild cerebral volume loss. Vascular: Normal flow voids. Skull and upper cervical spine: Normal marrow signal. Degenerative facet spurring in the upper cervical spine. Sinuses/Orbits: Negative. IMPRESSION: 1. No acute finding. 2. Small subcortical infarct in the left frontal white matter which occurred in 2023. No interval insult. Electronically Signed   By: Tiburcio Pea M.D.   On: 09/28/2023 04:52   DG Chest Portable 1 View  Result Date: 09/28/2023 CLINICAL DATA:  Clinical concern for pneumonia. EXAM: PORTABLE CHEST 1 VIEW COMPARISON:  Portable chest 05/13/2022 FINDINGS: The cardiac size is normal. Stable mediastinum with aortic  atherosclerosis and tortuosity. There is a low inspiration on exam. There is a chronically elevated right hemidiaphragm. There is increased opacity in the hypoinflated right lower lung field, greater medially and could be due to atelectasis, pneumonia or aspiration. A follow-up study is recommended in full inspiration. The remaining lungs are clear. There is no substantial pleural effusion.  Thoracic cage is intact. IMPRESSION: 1. Increased opacity in the hypoinflated right lower lung field, greater medially and could be due to atelectasis, pneumonia or aspiration. A follow-up PA Lat study is recommended in full inspiration. 2. Aortic atherosclerosis. Electronically Signed   By: Almira Bar M.D.   On: 09/28/2023 02:45   CT ANGIO HEAD NECK W WO CM W PERF (CODE STROKE)  Result Date: 09/28/2023 CLINICAL DATA:  Initial evaluation for neuro deficit, stroke. EXAM: CT ANGIOGRAPHY HEAD AND NECK CT PERFUSION BRAIN TECHNIQUE: Multidetector CT imaging of the head and neck was performed using the standard protocol during bolus administration of intravenous contrast. Multiplanar CT image reconstructions and MIPs were obtained  to evaluate the vascular anatomy. Carotid stenosis measurements (when applicable) are obtained utilizing NASCET criteria, using the distal internal carotid diameter as the denominator. Multiphase CT imaging of the brain was performed following IV bolus contrast injection. Subsequent parametric perfusion maps were calculated using RAPID software. RADIATION DOSE REDUCTION: This exam was performed according to the departmental dose-optimization program which includes automated exposure control, adjustment of the mA and/or kV according to patient size and/or use of iterative reconstruction technique. CONTRAST:  OMNIPAQUE IOHEXOL 350 MG/ML SOLN COMPARISON:  CT from earlier the same day. FINDINGS: CTA NECK FINDINGS Aortic arch: Examination degraded by motion artifact. Aortic arch within normal limits for caliber standard branch pattern. No stenosis about the origin the great vessels. Right carotid system: No evidence of dissection, stenosis (50% or greater) or occlusion. Left carotid system: No evidence of dissection, stenosis (50% or greater) or occlusion. Vertebral arteries: No evidence of dissection, stenosis (50% or greater) or occlusion. Skeleton: No discrete or worrisome osseous lesions. Moderate cervical spondylosis, most pronounced at C5-6 and C6-7. Other neck: No other acute finding. Upper chest: Patchy ground-glass opacity within the visualized right lung, concerning for pneumonia. Review of the MIP images confirms the above findings CTA HEAD FINDINGS Anterior circulation: Both internal carotid arteries widely patent to the termini without stenosis. A1 segments widely patent. Normal anterior communicating artery complex. Both anterior cerebral arteries widely patent to their distal aspects without stenosis. No M1 stenosis or occlusion. Normal MCA bifurcations. Distal MCA branches well perfused and symmetric. Posterior circulation: Both vertebral arteries patent to the vertebrobasilar junction without  stenosis. Right PICA patent. Left PICA not well seen. Basilar patent without stenosis. Superior cerebral arteries patent bilaterally. Both PCA supplied via the basilar as well as small bilateral posterior communicating arteries. Both PCAs patent to their distal aspects without stenosis. Venous sinuses: Patent allowing for timing the contrast bolus. Anatomic variants: As above.  No aneurysm. Review of the MIP images confirms the above findings CT Brain Perfusion Findings: ASPECTS: 10 CBF (<30%) Volume: 0mL Perfusion (Tmax>6.0s) volume: 0mL Mismatch Volume: 0mL Infarction Location:Negative CT perfusion with no evidence for acute ischemia or other perfusion abnormality. IMPRESSION: 1. Negative CTA of the head and neck. No large vessel occlusion or other emergent finding. 2. Negative CT perfusion with no evidence for acute ischemia or other perfusion abnormality. 3. Patchy ground-glass opacity within the visualized right lung, concerning for pneumonia. Electronically Signed   By: Rise Mu M.D.   On:  09/28/2023 01:10   CT HEAD CODE STROKE WO CONTRAST  Result Date: 09/28/2023 CLINICAL DATA:  Code stroke. Initial evaluation for neuro deficit, stroke suspected. EXAM: CT HEAD WITHOUT CONTRAST TECHNIQUE: Contiguous axial images were obtained from the base of the skull through the vertex without intravenous contrast. RADIATION DOSE REDUCTION: This exam was performed according to the departmental dose-optimization program which includes automated exposure control, adjustment of the mA and/or kV according to patient size and/or use of iterative reconstruction technique. COMPARISON:  CT from 05/20/2023. FINDINGS: Brain: Cerebral volume within normal limits. No acute intracranial hemorrhage. No acute large vessel territory infarct. Subcortical hypodensity at the left frontal lobe noted, either a chronic lacunar infarct or dilated perivascular space. No mass lesion, midline shift or mass effect. No hydrocephalus or  extra-axial fluid collection. Vascular: No abnormal hyperdense vessel. Focal atherosclerotic calcification involving the right M1 segment noted, stable. Skull: Scalp soft tissues demonstrate no acute finding. Calvarium intact. Sinuses/Orbits: Globes orbital soft tissues within normal limits. Paranasal sinuses and mastoid air cells are clear. Other: None. ASPECTS [facility] Stroke Program Early CT Score) - Ganglionic level infarction (caudate, lentiform nuclei, internal capsule, insula, M1-M3 cortex): 7 - Supraganglionic infarction (M4-M6 cortex): 3 Total score (0-10 with 10 being normal): 10 IMPRESSION: 1. No acute intracranial abnormality. 2. Aspects is 10. Results were called by telephone at the time of interpretation on 09/28/2023 at 12:42 am to provider [facility] , who verbally acknowledged these results. Electronically Signed   By: Rise Mu M.D.   On: 09/28/2023 00:42    Microbiology: Results for orders placed or performed during the hospital encounter of 09/28/23  Blood culture (routine x 2)     Status: None (Preliminary result)   Collection Time: 09/28/23  2:51 AM   Specimen: BLOOD  Result Value Ref Range Status   Specimen Description BLOOD BLOOD RIGHT ARM  Final   Special Requests   Final    BOTTLES DRAWN AEROBIC AND ANAEROBIC Blood Culture adequate volume   Culture   Final    NO GROWTH 3 DAYS Performed at [facility], [address] [place], [place] [address]    Report Status PENDING  Incomplete  Blood culture (routine x 2)     Status: None (Preliminary result)   Collection Time: 09/28/23  2:51 AM   Specimen: BLOOD  Result Value Ref Range Status   Specimen Description BLOOD BLOOD RIGHT ARM BLOOD RIGHT HAND  Final   Special Requests   Final    BOTTLES DRAWN AEROBIC AND ANAEROBIC Blood Culture adequate volume   Culture   Final    NO GROWTH 3 DAYS Performed at [facility], [address] [place], [place] [address]    Report Status PENDING   Incomplete  SARS Coronavirus 2 by RT PCR (hospital order, performed in [facility] hospital lab) *cepheid single result test* Anterior Nasal Swab     Status: None   Collection Time: 09/28/23  4:11 AM   Specimen: Anterior Nasal Swab  Result Value Ref Range Status   SARS Coronavirus 2 by RT PCR NEGATIVE NEGATIVE Final    Comment: (NOTE) SARS-CoV-2 target nucleic acids are NOT DETECTED.  The SARS-CoV-2 RNA is generally detectable in upper and lower respiratory specimens during the acute phase of infection. The lowest concentration of SARS-CoV-2 viral copies this assay can detect is 250 copies / mL. A negative result does not preclude SARS-CoV-2 infection and should not be used as the sole basis for treatment or other patient management  decisions.  A negative result may occur with improper specimen collection / handling, submission of specimen other than nasopharyngeal swab, presence of viral mutation(s) within the areas targeted by this assay, and inadequate number of viral copies (<250 copies / mL). A negative result must be combined with clinical observations, patient history, and epidemiological information.  Fact Sheet for Patients:   RoadLapTop.co.za  Fact Sheet for Healthcare Providers: http://kim-miller.com/  This test is not yet approved or  cleared by the [place] FDA and has been authorized for detection and/or diagnosis of SARS-CoV-2 by FDA under an Emergency Use Authorization (EUA).  This EUA will remain in effect (meaning this test can be used) for the duration of the COVID-19 declaration under Section 564(b)(1) of the Act, 21 U.S.C. section 360bbb-3(b)(1), unless the authorization is terminated or revoked sooner.  Performed at [facility], [address] [place], [place] [address]     Labs: CBC: Recent Labs  Lab 09/28/23 0058 09/29/23 0852  WBC 17.3* 8.3  NEUTROABS 13.1*  --   HGB 10.9* 10.1*  HCT  37.3 33.7*  MCV 77.7* 74.6*  PLT 152 127*   Basic Metabolic Panel: Recent Labs  Lab 09/28/23 0058 09/29/23 0852  NA 140 139  K 3.0* 3.3*  CL 105 104  CO2 22 26  GLUCOSE 166* 139*  BUN 15 11  CREATININE 1.05* 0.77  CALCIUM 7.9* 8.5*   Liver Function Tests: Recent Labs  Lab 09/28/23 0058  AST 17  ALT 11  ALKPHOS 64  BILITOT 0.4  PROT 6.3*  ALBUMIN 3.0*   CBG: Recent Labs  Lab 09/28/23 1648 09/28/23 2123 09/29/23 0733 09/29/23 1229 09/29/23 1538  GLUCAP 130* 145* 116* 147* 126*    Discharge time spent: greater than 30 minutes.  Signed: Pennie Banter, DO [facility] 10/01/2023

## 2023-09-29 NOTE — Progress Notes (Signed)
Physical Therapy Treatment Patient Details Name: Rachel Nielsen MRN: [id number] DOB: Oct 30, 1951 Today's Date: 09/29/2023   History of Present Illness Rachel Nielsen is a 72 y.o. female with medical history significant for COPD,  hypertension, anxiety, OSA, CAD and multiple previous stroke with residual left-sided weakness, dependent on a Younis Mathey, who presented as a code stroke after reportedly awaking from sleep with slurred speech and generalized weakness. Pt admitted for stroke workup. CT/MRI negative for acute findings.    PT Comments  Patient supine in bed on arrival and agreeable to PT session. Patient able to complete bed mobility and sit to stand with supervision. Ambulated 100' with RW and supervision with slow steady pace which patient reports is close to baseline. Anticipate patient is nearing baseline functioning. Current plan remains appropriate.    If plan is discharge home, recommend the following: Assistance with cooking/housework;Assist for transportation;Help with stairs or ramp for entrance;Direct supervision/assist for medications management   Can travel by private vehicle        Equipment Recommendations  None recommended by PT    Recommendations for Other Services       Precautions / Restrictions Precautions Precautions: Fall Restrictions Weight Bearing Restrictions: No     Mobility  Bed Mobility Overal bed mobility: Needs Assistance Bed Mobility: Supine to Sit, Sit to Supine     Supine to sit: Supervision Sit to supine: Supervision        Transfers Overall transfer level: Needs assistance Equipment used: Rolling Kateria Cutrona (2 wheels) Transfers: Sit to/from Stand Sit to Stand: Supervision                Ambulation/Gait Ambulation/Gait assistance: Supervision Gait Distance (Feet): 100 Feet Assistive device: Rolling Junelle Hashemi (2 wheels) Gait Pattern/deviations: Step-through pattern, Decreased stride length Gait velocity: decreased     General  Gait Details: slow steady pace. patient reports baseline pace   Optometrist     Tilt Bed    Modified Rankin (Stroke Patients Only)       Balance Overall balance assessment: Needs assistance Sitting-balance support: No upper extremity supported, Feet supported Sitting balance-Leahy Scale: Fair     Standing balance support: Reliant on assistive device for balance, During functional activity, Bilateral upper extremity supported Standing balance-Leahy Scale: Fair                              Cognition Arousal: Alert Behavior During Therapy: WFL for tasks assessed/performed Overall Cognitive Status: Within Functional Limits for tasks assessed                                          Exercises Other Exercises Other Exercises: sit to stand x 10 with B UE support    General Comments        Pertinent Vitals/Pain Pain Assessment Pain Assessment: No/denies pain    Home Living                          Prior Function            PT Goals (current goals can now be found in the care plan section) Acute Rehab PT Goals Patient Stated Goal: to go home PT Goal Formulation: With patient Time For Goal Achievement: 10/12/23  Potential to Achieve Goals: Good Progress towards PT goals: Progressing toward goals    Frequency    Min 1X/week      PT Plan      Co-evaluation              AM-PAC PT "6 Clicks" Mobility   Outcome Measure  Help needed turning from your back to your side while in a flat bed without using bedrails?: None Help needed moving from lying on your back to sitting on the side of a flat bed without using bedrails?: None Help needed moving to and from a bed to a chair (including a wheelchair)?: None Help needed standing up from a chair using your arms (e.g., wheelchair or bedside chair)?: A Little Help needed to walk in hospital room?: A Little Help needed climbing 3-5 steps  with a railing? : A Little 6 Click Score: 21    End of Session   Activity Tolerance: Patient tolerated treatment well Patient left: in bed;with call bell/phone within reach;with bed alarm set Nurse Communication: Mobility status PT Visit Diagnosis: Other abnormalities of gait and mobility (R26.89);Difficulty in walking, not elsewhere classified (R26.2);Muscle weakness (generalized) (M62.81)     Time: 5277-8242 PT Time Calculation (min) (ACUTE ONLY): 16 min  Charges:    $Therapeutic Exercise: 8-22 mins PT General Charges $$ ACUTE PT VISIT: 1 Visit                     Maylon Peppers, PT, DPT Physical Therapist - [facility]  [facility]    Tomer Chalmers A Waniya Hoglund 09/29/2023, 2:52 PM

## 2023-09-29 NOTE — TOC Transition Note (Signed)
Transition of Care [facility]) - CM/SW Discharge Note   Patient Details  Name: Rachel Nielsen MRN: [id number] Date of Birth: 11-10-1950  [facility]) CM/SW Contact:  Allena Katz, LCSW Phone Number: 09/29/2023, 2:49 PM   Clinical Narrative:   Pt to [facility] with [facility]. Adelina Mings with [facility] notified. CSW signing off.     Final next level of care: Home w Home Health Services Barriers to Discharge: Barriers Resolved   Patient Goals and CMS Choice CMS Medicare.gov Compare Post Acute Care list provided to:: Patient Choice offered to / list presented to : Patient  Discharge Placement                         Discharge Plan and Services Additional resources added to the After Visit Summary for                            [facility] Arranged: PT, OT [facility] Agency: [facility] Date [facility] Agency Contacted: 09/29/23   Representative spoke with at [facility] Agency: Adelina Mings  Social Determinants of Health (SDOH) Interventions SDOH Screenings   Food Insecurity: No Food Insecurity (09/28/2023)  Housing: Low Risk  (09/28/2023)  Transportation Needs: No Transportation Needs (09/28/2023)  Utilities: Not At Risk (09/28/2023)  Alcohol Screen: Low Risk  (12/25/2022)  Depression (PHQ2-9): High Risk (02/12/2023)  Financial Resource Strain: Low Risk  (09/13/2023)   Received from [facility]  Physical Activity: Inactive (12/25/2022)  Social Connections: Socially Isolated (12/25/2022)  Stress: No Stress Concern Present (12/25/2022)  Tobacco Use: Medium Risk (09/28/2023)     Readmission Risk Interventions     No data to display

## 2023-10-01 ENCOUNTER — Encounter: Payer: Self-pay | Admitting: Internal Medicine

## 2023-10-03 LAB — CULTURE, BLOOD (ROUTINE X 2)
Culture: NO GROWTH
Culture: NO GROWTH
Special Requests: ADEQUATE
Special Requests: ADEQUATE

## 2023-10-05 DIAGNOSIS — J4489 Other specified chronic obstructive pulmonary disease: Secondary | ICD-10-CM | POA: Diagnosis not present

## 2023-10-05 DIAGNOSIS — J189 Pneumonia, unspecified organism: Secondary | ICD-10-CM | POA: Diagnosis not present

## 2023-10-05 DIAGNOSIS — E876 Hypokalemia: Secondary | ICD-10-CM | POA: Diagnosis not present

## 2023-10-05 DIAGNOSIS — I69354 Hemiplegia and hemiparesis following cerebral infarction affecting left non-dominant side: Secondary | ICD-10-CM | POA: Diagnosis not present

## 2023-10-05 DIAGNOSIS — I10 Essential (primary) hypertension: Secondary | ICD-10-CM | POA: Diagnosis not present

## 2023-10-05 DIAGNOSIS — G9341 Metabolic encephalopathy: Secondary | ICD-10-CM | POA: Diagnosis not present

## 2023-10-05 DIAGNOSIS — A419 Sepsis, unspecified organism: Secondary | ICD-10-CM | POA: Diagnosis not present

## 2023-10-05 DIAGNOSIS — I2510 Atherosclerotic heart disease of native coronary artery without angina pectoris: Secondary | ICD-10-CM | POA: Diagnosis not present

## 2023-10-05 DIAGNOSIS — E119 Type 2 diabetes mellitus without complications: Secondary | ICD-10-CM | POA: Diagnosis not present

## 2023-10-08 DIAGNOSIS — J4489 Other specified chronic obstructive pulmonary disease: Secondary | ICD-10-CM | POA: Diagnosis not present

## 2023-10-08 DIAGNOSIS — J189 Pneumonia, unspecified organism: Secondary | ICD-10-CM | POA: Diagnosis not present

## 2023-10-08 DIAGNOSIS — I10 Essential (primary) hypertension: Secondary | ICD-10-CM | POA: Diagnosis not present

## 2023-10-08 DIAGNOSIS — Z09 Encounter for follow-up examination after completed treatment for conditions other than malignant neoplasm: Secondary | ICD-10-CM | POA: Diagnosis not present

## 2023-10-11 DIAGNOSIS — J189 Pneumonia, unspecified organism: Secondary | ICD-10-CM | POA: Diagnosis not present

## 2023-10-11 DIAGNOSIS — A419 Sepsis, unspecified organism: Secondary | ICD-10-CM | POA: Diagnosis not present

## 2023-10-11 DIAGNOSIS — I69354 Hemiplegia and hemiparesis following cerebral infarction affecting left non-dominant side: Secondary | ICD-10-CM | POA: Diagnosis not present

## 2023-10-11 DIAGNOSIS — E876 Hypokalemia: Secondary | ICD-10-CM | POA: Diagnosis not present

## 2023-10-11 DIAGNOSIS — E119 Type 2 diabetes mellitus without complications: Secondary | ICD-10-CM | POA: Diagnosis not present

## 2023-10-11 DIAGNOSIS — I2510 Atherosclerotic heart disease of native coronary artery without angina pectoris: Secondary | ICD-10-CM | POA: Diagnosis not present

## 2023-10-11 DIAGNOSIS — G9341 Metabolic encephalopathy: Secondary | ICD-10-CM | POA: Diagnosis not present

## 2023-10-11 DIAGNOSIS — J4489 Other specified chronic obstructive pulmonary disease: Secondary | ICD-10-CM | POA: Diagnosis not present

## 2023-10-11 DIAGNOSIS — I10 Essential (primary) hypertension: Secondary | ICD-10-CM | POA: Diagnosis not present

## 2023-10-12 DIAGNOSIS — J189 Pneumonia, unspecified organism: Secondary | ICD-10-CM | POA: Diagnosis not present

## 2023-10-12 DIAGNOSIS — I69354 Hemiplegia and hemiparesis following cerebral infarction affecting left non-dominant side: Secondary | ICD-10-CM | POA: Diagnosis not present

## 2023-10-12 DIAGNOSIS — I2510 Atherosclerotic heart disease of native coronary artery without angina pectoris: Secondary | ICD-10-CM | POA: Diagnosis not present

## 2023-10-12 DIAGNOSIS — I10 Essential (primary) hypertension: Secondary | ICD-10-CM | POA: Diagnosis not present

## 2023-10-12 DIAGNOSIS — A419 Sepsis, unspecified organism: Secondary | ICD-10-CM | POA: Diagnosis not present

## 2023-10-12 DIAGNOSIS — G9341 Metabolic encephalopathy: Secondary | ICD-10-CM | POA: Diagnosis not present

## 2023-10-12 DIAGNOSIS — E876 Hypokalemia: Secondary | ICD-10-CM | POA: Diagnosis not present

## 2023-10-14 DIAGNOSIS — E876 Hypokalemia: Secondary | ICD-10-CM | POA: Diagnosis not present

## 2023-10-14 DIAGNOSIS — I2510 Atherosclerotic heart disease of native coronary artery without angina pectoris: Secondary | ICD-10-CM | POA: Diagnosis not present

## 2023-10-14 DIAGNOSIS — J4489 Other specified chronic obstructive pulmonary disease: Secondary | ICD-10-CM | POA: Diagnosis not present

## 2023-10-14 DIAGNOSIS — I69354 Hemiplegia and hemiparesis following cerebral infarction affecting left non-dominant side: Secondary | ICD-10-CM | POA: Diagnosis not present

## 2023-10-14 DIAGNOSIS — J189 Pneumonia, unspecified organism: Secondary | ICD-10-CM | POA: Diagnosis not present

## 2023-10-14 DIAGNOSIS — A419 Sepsis, unspecified organism: Secondary | ICD-10-CM | POA: Diagnosis not present

## 2023-10-14 DIAGNOSIS — I10 Essential (primary) hypertension: Secondary | ICD-10-CM | POA: Diagnosis not present

## 2023-10-14 DIAGNOSIS — E119 Type 2 diabetes mellitus without complications: Secondary | ICD-10-CM | POA: Diagnosis not present

## 2023-10-14 DIAGNOSIS — G9341 Metabolic encephalopathy: Secondary | ICD-10-CM | POA: Diagnosis not present

## 2023-10-15 DIAGNOSIS — I2510 Atherosclerotic heart disease of native coronary artery without angina pectoris: Secondary | ICD-10-CM | POA: Diagnosis not present

## 2023-10-15 DIAGNOSIS — E119 Type 2 diabetes mellitus without complications: Secondary | ICD-10-CM | POA: Diagnosis not present

## 2023-10-15 DIAGNOSIS — J4489 Other specified chronic obstructive pulmonary disease: Secondary | ICD-10-CM | POA: Diagnosis not present

## 2023-10-15 DIAGNOSIS — I10 Essential (primary) hypertension: Secondary | ICD-10-CM | POA: Diagnosis not present

## 2023-10-15 DIAGNOSIS — J189 Pneumonia, unspecified organism: Secondary | ICD-10-CM | POA: Diagnosis not present

## 2023-10-15 DIAGNOSIS — I69354 Hemiplegia and hemiparesis following cerebral infarction affecting left non-dominant side: Secondary | ICD-10-CM | POA: Diagnosis not present

## 2023-10-15 DIAGNOSIS — G9341 Metabolic encephalopathy: Secondary | ICD-10-CM | POA: Diagnosis not present

## 2023-10-15 DIAGNOSIS — E876 Hypokalemia: Secondary | ICD-10-CM | POA: Diagnosis not present

## 2023-10-15 DIAGNOSIS — A419 Sepsis, unspecified organism: Secondary | ICD-10-CM | POA: Diagnosis not present

## 2023-10-17 ENCOUNTER — Other Ambulatory Visit: Payer: Self-pay | Admitting: Internal Medicine

## 2023-10-17 DIAGNOSIS — I10 Essential (primary) hypertension: Secondary | ICD-10-CM

## 2023-10-18 NOTE — Telephone Encounter (Signed)
Requested Prescriptions  Pending Prescriptions Disp Refills   bisoprolol (ZEBETA) 10 MG tablet [Pharmacy Med Name: BISOPROLOL FUMARATE 10 MG TAB] 90 tablet 0    Sig: Take 1 tablet (10 mg total) by mouth daily. OFFICE VISIT NEEDED FOR ADDITIONAL REFILLS     Cardiovascular: Beta Blockers 2 Failed - 10/18/2023  3:45 PM      Failed - Last BP in normal range    BP Readings from Last 1 Encounters:  09/29/23 (!) 132/93         Failed - Valid encounter within last 6 months    Recent Outpatient Visits           8 months ago Encounter for general adult medical examination with abnormal findings   [place] [facility] [place], [place] W, NP   1 year ago Uncontrolled type 2 diabetes mellitus with hyperglycemia [facility])   [place] [facility] [place], Salvadore Oxford, NP   1 year ago Encounter for general adult medical examination with abnormal findings   [place] [facility] [place], Salvadore Oxford, NP   1 year ago COPD exacerbation [facility])   [place] [facility] [place], [place] W, NP   2 years ago Mixed diabetic hyperlipidemia associated with type 2 diabetes mellitus [facility])   [place] [facility] [place], [place] W, NP              Passed - Cr in normal range and within 360 days    Creat  Date Value Ref Range Status  02/12/2023 0.90 0.50 - 1.05 mg/dL Final   Creatinine, Ser  Date Value Ref Range Status  09/29/2023 0.77 0.44 - 1.00 mg/dL Final   Creatinine, Urine  Date Value Ref Range Status  02/12/2023 148 20 - 275 mg/dL Final         Passed - Last Heart Rate in normal range    Pulse Readings from Last 1 Encounters:  09/29/23 90

## 2023-10-19 ENCOUNTER — Other Ambulatory Visit: Payer: Self-pay | Admitting: Internal Medicine

## 2023-10-19 DIAGNOSIS — I10 Essential (primary) hypertension: Secondary | ICD-10-CM

## 2023-10-20 DIAGNOSIS — J189 Pneumonia, unspecified organism: Secondary | ICD-10-CM | POA: Diagnosis not present

## 2023-10-20 DIAGNOSIS — E876 Hypokalemia: Secondary | ICD-10-CM | POA: Diagnosis not present

## 2023-10-20 DIAGNOSIS — G9341 Metabolic encephalopathy: Secondary | ICD-10-CM | POA: Diagnosis not present

## 2023-10-20 DIAGNOSIS — I69354 Hemiplegia and hemiparesis following cerebral infarction affecting left non-dominant side: Secondary | ICD-10-CM | POA: Diagnosis not present

## 2023-10-20 DIAGNOSIS — I10 Essential (primary) hypertension: Secondary | ICD-10-CM | POA: Diagnosis not present

## 2023-10-20 DIAGNOSIS — I2510 Atherosclerotic heart disease of native coronary artery without angina pectoris: Secondary | ICD-10-CM | POA: Diagnosis not present

## 2023-10-20 DIAGNOSIS — J4489 Other specified chronic obstructive pulmonary disease: Secondary | ICD-10-CM | POA: Diagnosis not present

## 2023-10-20 DIAGNOSIS — A419 Sepsis, unspecified organism: Secondary | ICD-10-CM | POA: Diagnosis not present

## 2023-10-20 DIAGNOSIS — E119 Type 2 diabetes mellitus without complications: Secondary | ICD-10-CM | POA: Diagnosis not present

## 2023-10-21 DIAGNOSIS — J189 Pneumonia, unspecified organism: Secondary | ICD-10-CM | POA: Diagnosis not present

## 2023-10-21 DIAGNOSIS — I69354 Hemiplegia and hemiparesis following cerebral infarction affecting left non-dominant side: Secondary | ICD-10-CM | POA: Diagnosis not present

## 2023-10-21 DIAGNOSIS — G9341 Metabolic encephalopathy: Secondary | ICD-10-CM | POA: Diagnosis not present

## 2023-10-21 DIAGNOSIS — J4489 Other specified chronic obstructive pulmonary disease: Secondary | ICD-10-CM | POA: Diagnosis not present

## 2023-10-21 DIAGNOSIS — I2510 Atherosclerotic heart disease of native coronary artery without angina pectoris: Secondary | ICD-10-CM | POA: Diagnosis not present

## 2023-10-21 DIAGNOSIS — A419 Sepsis, unspecified organism: Secondary | ICD-10-CM | POA: Diagnosis not present

## 2023-10-21 DIAGNOSIS — I10 Essential (primary) hypertension: Secondary | ICD-10-CM | POA: Diagnosis not present

## 2023-10-21 DIAGNOSIS — E876 Hypokalemia: Secondary | ICD-10-CM | POA: Diagnosis not present

## 2023-10-21 DIAGNOSIS — E119 Type 2 diabetes mellitus without complications: Secondary | ICD-10-CM | POA: Diagnosis not present

## 2023-10-28 DIAGNOSIS — J189 Pneumonia, unspecified organism: Secondary | ICD-10-CM | POA: Diagnosis not present

## 2023-10-28 DIAGNOSIS — I69354 Hemiplegia and hemiparesis following cerebral infarction affecting left non-dominant side: Secondary | ICD-10-CM | POA: Diagnosis not present

## 2023-10-28 DIAGNOSIS — G9341 Metabolic encephalopathy: Secondary | ICD-10-CM | POA: Diagnosis not present

## 2023-10-28 DIAGNOSIS — I2510 Atherosclerotic heart disease of native coronary artery without angina pectoris: Secondary | ICD-10-CM | POA: Diagnosis not present

## 2023-10-28 DIAGNOSIS — A419 Sepsis, unspecified organism: Secondary | ICD-10-CM | POA: Diagnosis not present

## 2023-10-28 DIAGNOSIS — E876 Hypokalemia: Secondary | ICD-10-CM | POA: Diagnosis not present

## 2023-10-28 DIAGNOSIS — J4489 Other specified chronic obstructive pulmonary disease: Secondary | ICD-10-CM | POA: Diagnosis not present

## 2023-10-28 DIAGNOSIS — I10 Essential (primary) hypertension: Secondary | ICD-10-CM | POA: Diagnosis not present

## 2023-10-28 DIAGNOSIS — E119 Type 2 diabetes mellitus without complications: Secondary | ICD-10-CM | POA: Diagnosis not present

## 2023-10-29 DIAGNOSIS — E876 Hypokalemia: Secondary | ICD-10-CM | POA: Diagnosis not present

## 2023-10-29 DIAGNOSIS — J189 Pneumonia, unspecified organism: Secondary | ICD-10-CM | POA: Diagnosis not present

## 2023-10-29 DIAGNOSIS — A419 Sepsis, unspecified organism: Secondary | ICD-10-CM | POA: Diagnosis not present

## 2023-10-29 DIAGNOSIS — I2510 Atherosclerotic heart disease of native coronary artery without angina pectoris: Secondary | ICD-10-CM | POA: Diagnosis not present

## 2023-10-29 DIAGNOSIS — J4489 Other specified chronic obstructive pulmonary disease: Secondary | ICD-10-CM | POA: Diagnosis not present

## 2023-10-29 DIAGNOSIS — I10 Essential (primary) hypertension: Secondary | ICD-10-CM | POA: Diagnosis not present

## 2023-10-29 DIAGNOSIS — I69354 Hemiplegia and hemiparesis following cerebral infarction affecting left non-dominant side: Secondary | ICD-10-CM | POA: Diagnosis not present

## 2023-10-29 DIAGNOSIS — E119 Type 2 diabetes mellitus without complications: Secondary | ICD-10-CM | POA: Diagnosis not present

## 2023-10-29 DIAGNOSIS — G9341 Metabolic encephalopathy: Secondary | ICD-10-CM | POA: Diagnosis not present

## 2023-11-01 DIAGNOSIS — H402233 Chronic angle-closure glaucoma, bilateral, severe stage: Secondary | ICD-10-CM | POA: Diagnosis not present

## 2023-11-03 DIAGNOSIS — I10 Essential (primary) hypertension: Secondary | ICD-10-CM | POA: Diagnosis not present

## 2023-11-03 DIAGNOSIS — G9341 Metabolic encephalopathy: Secondary | ICD-10-CM | POA: Diagnosis not present

## 2023-11-03 DIAGNOSIS — A419 Sepsis, unspecified organism: Secondary | ICD-10-CM | POA: Diagnosis not present

## 2023-11-03 DIAGNOSIS — E119 Type 2 diabetes mellitus without complications: Secondary | ICD-10-CM | POA: Diagnosis not present

## 2023-11-03 DIAGNOSIS — I2510 Atherosclerotic heart disease of native coronary artery without angina pectoris: Secondary | ICD-10-CM | POA: Diagnosis not present

## 2023-11-03 DIAGNOSIS — J189 Pneumonia, unspecified organism: Secondary | ICD-10-CM | POA: Diagnosis not present

## 2023-11-03 DIAGNOSIS — J4489 Other specified chronic obstructive pulmonary disease: Secondary | ICD-10-CM | POA: Diagnosis not present

## 2023-11-03 DIAGNOSIS — I69354 Hemiplegia and hemiparesis following cerebral infarction affecting left non-dominant side: Secondary | ICD-10-CM | POA: Diagnosis not present

## 2023-11-03 DIAGNOSIS — E876 Hypokalemia: Secondary | ICD-10-CM | POA: Diagnosis not present

## 2023-11-20 ENCOUNTER — Emergency Department: Payer: Medicare PPO

## 2023-11-20 ENCOUNTER — Observation Stay
Admission: EM | Admit: 2023-11-20 | Discharge: 2023-11-22 | Disposition: A | Discharge status: Home / Self Care | Payer: Medicare PPO | Attending: Obstetrics and Gynecology | Admitting: Obstetrics and Gynecology

## 2023-11-20 ENCOUNTER — Other Ambulatory Visit: Payer: Self-pay

## 2023-11-20 DIAGNOSIS — E119 Type 2 diabetes mellitus without complications: Secondary | ICD-10-CM

## 2023-11-20 DIAGNOSIS — D500 Iron deficiency anemia secondary to blood loss (chronic): Secondary | ICD-10-CM | POA: Insufficient documentation

## 2023-11-20 DIAGNOSIS — F32A Depression, unspecified: Secondary | ICD-10-CM | POA: Diagnosis present

## 2023-11-20 DIAGNOSIS — Z794 Long term (current) use of insulin: Secondary | ICD-10-CM | POA: Diagnosis not present

## 2023-11-20 DIAGNOSIS — K5909 Other constipation: Secondary | ICD-10-CM | POA: Diagnosis present

## 2023-11-20 DIAGNOSIS — Z79899 Other long term (current) drug therapy: Secondary | ICD-10-CM | POA: Diagnosis not present

## 2023-11-20 DIAGNOSIS — I10 Essential (primary) hypertension: Secondary | ICD-10-CM | POA: Diagnosis present

## 2023-11-20 DIAGNOSIS — D649 Anemia, unspecified: Secondary | ICD-10-CM | POA: Insufficient documentation

## 2023-11-20 DIAGNOSIS — Z7902 Long term (current) use of antithrombotics/antiplatelets: Secondary | ICD-10-CM | POA: Insufficient documentation

## 2023-11-20 DIAGNOSIS — Z1152 Encounter for screening for COVID-19: Secondary | ICD-10-CM | POA: Insufficient documentation

## 2023-11-20 DIAGNOSIS — Y92009 Unspecified place in unspecified non-institutional (private) residence as the place of occurrence of the external cause: Secondary | ICD-10-CM | POA: Diagnosis not present

## 2023-11-20 DIAGNOSIS — Z87891 Personal history of nicotine dependence: Secondary | ICD-10-CM | POA: Insufficient documentation

## 2023-11-20 DIAGNOSIS — R5381 Other malaise: Secondary | ICD-10-CM

## 2023-11-20 DIAGNOSIS — Z7985 Long-term (current) use of injectable non-insulin antidiabetic drugs: Secondary | ICD-10-CM | POA: Insufficient documentation

## 2023-11-20 DIAGNOSIS — Z7982 Long term (current) use of aspirin: Secondary | ICD-10-CM | POA: Diagnosis not present

## 2023-11-20 DIAGNOSIS — J441 Chronic obstructive pulmonary disease with (acute) exacerbation: Secondary | ICD-10-CM | POA: Diagnosis present

## 2023-11-20 DIAGNOSIS — N390 Urinary tract infection, site not specified: Secondary | ICD-10-CM

## 2023-11-20 DIAGNOSIS — I69351 Hemiplegia and hemiparesis following cerebral infarction affecting right dominant side: Secondary | ICD-10-CM | POA: Diagnosis not present

## 2023-11-20 DIAGNOSIS — I2510 Atherosclerotic heart disease of native coronary artery without angina pectoris: Secondary | ICD-10-CM | POA: Diagnosis not present

## 2023-11-20 DIAGNOSIS — Z8673 Personal history of transient ischemic attack (TIA), and cerebral infarction without residual deficits: Secondary | ICD-10-CM

## 2023-11-20 DIAGNOSIS — R531 Weakness: Secondary | ICD-10-CM | POA: Diagnosis present

## 2023-11-20 DIAGNOSIS — W19XXXA Unspecified fall, initial encounter: Secondary | ICD-10-CM | POA: Diagnosis not present

## 2023-11-20 DIAGNOSIS — N179 Acute kidney failure, unspecified: Secondary | ICD-10-CM | POA: Diagnosis present

## 2023-11-20 DIAGNOSIS — I959 Hypotension, unspecified: Secondary | ICD-10-CM

## 2023-11-20 DIAGNOSIS — G4733 Obstructive sleep apnea (adult) (pediatric): Secondary | ICD-10-CM | POA: Diagnosis present

## 2023-11-20 DIAGNOSIS — E66813 Obesity, class 3: Secondary | ICD-10-CM | POA: Insufficient documentation

## 2023-11-20 DIAGNOSIS — Z7984 Long term (current) use of oral hypoglycemic drugs: Secondary | ICD-10-CM | POA: Insufficient documentation

## 2023-11-20 DIAGNOSIS — F419 Anxiety disorder, unspecified: Secondary | ICD-10-CM | POA: Diagnosis present

## 2023-11-20 LAB — URINALYSIS, ROUTINE W REFLEX MICROSCOPIC
Bilirubin Urine: NEGATIVE
Glucose, UA: >=500 mg/dL — AB
Hgb urine dipstick: NEGATIVE
Ketones, ur: 5 mg/dL — AB
Nitrite: NEGATIVE
Protein, ur: 30 mg/dL — AB
Specific Gravity, Urine: 1.027 (ref 1.005–1.030)
pH: 5 (ref 5.0–8.0)

## 2023-11-20 LAB — CBC
HCT: 38.3 % (ref 36.0–46.0)
Hemoglobin: 10.9 g/dL — ABNORMAL LOW (ref 12.0–15.0)
MCH: 22.3 pg — ABNORMAL LOW (ref 26.0–34.0)
MCHC: 28.5 g/dL — ABNORMAL LOW (ref 30.0–36.0)
MCV: 78.5 fL — ABNORMAL LOW (ref 80.0–100.0)
Platelets: 209 10*3/uL (ref 150–400)
RBC: 4.88 MIL/uL (ref 3.87–5.11)
RDW: 18.6 % — ABNORMAL HIGH (ref 11.5–15.5)
WBC: 9.5 10*3/uL (ref 4.0–10.5)
nRBC: 0 % (ref 0.0–0.2)

## 2023-11-20 LAB — RESP PANEL BY RT-PCR (RSV, FLU A&B, COVID)  RVPGX2
Influenza A by PCR: NEGATIVE
Influenza B by PCR: NEGATIVE
Resp Syncytial Virus by PCR: NEGATIVE
SARS Coronavirus 2 by RT PCR: NEGATIVE

## 2023-11-20 LAB — BASIC METABOLIC PANEL
Anion gap: 13 (ref 5–15)
BUN: 21 mg/dL (ref 8–23)
CO2: 23 mmol/L (ref 22–32)
Calcium: 8.7 mg/dL — ABNORMAL LOW (ref 8.9–10.3)
Chloride: 104 mmol/L (ref 98–111)
Creatinine, Ser: 1.07 mg/dL — ABNORMAL HIGH (ref 0.44–1.00)
GFR, Estimated: 56 mL/min — ABNORMAL LOW (ref >60)
Glucose, Bld: 96 mg/dL (ref 70–99)
Potassium: 3.6 mmol/L (ref 3.5–5.1)
Sodium: 140 mmol/L (ref 135–145)

## 2023-11-20 MED ORDER — ALBUTEROL SULFATE (2.5 MG/3ML) 0.083% IN NEBU: 2.5000 mg | INHALATION_SOLUTION | RESPIRATORY_TRACT | Status: DC | PRN

## 2023-11-20 MED ORDER — PREDNISONE 20 MG PO TABS
60.0000 mg | ORAL_TABLET | Freq: Once | ORAL | Status: AC
  Administered 2023-11-20: 60 mg via ORAL
  Filled 2023-11-20: qty 3

## 2023-11-20 MED ORDER — INSULIN ASPART 100 UNIT/ML IJ SOLN
0.0000 [IU] | Freq: Three times a day (TID) | INTRAMUSCULAR | Status: DC
  Administered 2023-11-21: 3 [IU] via SUBCUTANEOUS
  Administered 2023-11-21: 2 [IU] via SUBCUTANEOUS
  Administered 2023-11-21: 11 [IU] via SUBCUTANEOUS
  Administered 2023-11-22 (×2): 2 [IU] via SUBCUTANEOUS
  Filled 2023-11-20 (×5): qty 1

## 2023-11-20 MED ORDER — MIRABEGRON ER 25 MG PO TB24
25.0000 mg | ORAL_TABLET | Freq: Every day | ORAL | Status: DC
  Administered 2023-11-21 – 2023-11-22 (×2): 25 mg via ORAL
  Filled 2023-11-20 (×2): qty 1

## 2023-11-20 MED ORDER — IPRATROPIUM-ALBUTEROL 0.5-2.5 (3) MG/3ML IN SOLN
6.0000 mL | Freq: Once | RESPIRATORY_TRACT | Status: AC
  Administered 2023-11-20: 6 mL via RESPIRATORY_TRACT
  Filled 2023-11-20: qty 3

## 2023-11-20 MED ORDER — SODIUM CHLORIDE 0.9 % IV BOLUS
1000.0000 mL | Freq: Once | INTRAVENOUS | Status: AC
  Administered 2023-11-21: 1000 mL via INTRAVENOUS

## 2023-11-20 MED ORDER — PREDNISONE 20 MG PO TABS: 40.0000 mg | ORAL_TABLET | Freq: Every day | ORAL | Status: DC

## 2023-11-20 MED ORDER — BRIMONIDINE TARTRATE 0.2 % OP SOLN
1.0000 [drp] | Freq: Three times a day (TID) | OPHTHALMIC | Status: DC
  Administered 2023-11-21 – 2023-11-22 (×4): 1 [drp] via OPHTHALMIC
  Filled 2023-11-20: qty 5

## 2023-11-20 MED ORDER — IPRATROPIUM-ALBUTEROL 0.5-2.5 (3) MG/3ML IN SOLN
3.0000 mL | Freq: Four times a day (QID) | RESPIRATORY_TRACT | Status: DC
  Administered 2023-11-21: 3 mL via RESPIRATORY_TRACT
  Filled 2023-11-20 (×2): qty 3

## 2023-11-20 MED ORDER — ACETAMINOPHEN 500 MG PO TABS
1000.0000 mg | ORAL_TABLET | Freq: Once | ORAL | Status: AC
  Administered 2023-11-20: 1000 mg via ORAL
  Filled 2023-11-20: qty 2

## 2023-11-20 MED ORDER — ENOXAPARIN SODIUM 60 MG/0.6ML IJ SOSY
0.5000 mg/kg | PREFILLED_SYRINGE | INTRAMUSCULAR | Status: DC
  Administered 2023-11-21 (×2): 60 mg via SUBCUTANEOUS
  Filled 2023-11-20 (×2): qty 0.6

## 2023-11-20 MED ORDER — ROSUVASTATIN CALCIUM 10 MG PO TABS
10.0000 mg | ORAL_TABLET | Freq: Every day | ORAL | Status: DC
  Administered 2023-11-21 – 2023-11-22 (×2): 10 mg via ORAL
  Filled 2023-11-20 (×2): qty 1

## 2023-11-20 MED ORDER — METHYLPREDNISOLONE SODIUM SUCC 40 MG IJ SOLR
40.0000 mg | Freq: Two times a day (BID) | INTRAMUSCULAR | Status: DC
Start: 2023-11-20 — End: 2023-11-21
  Administered 2023-11-21: 40 mg via INTRAVENOUS
  Filled 2023-11-20: qty 1

## 2023-11-20 MED ORDER — INSULIN ASPART 100 UNIT/ML IJ SOLN
0.0000 [IU] | Freq: Every day | INTRAMUSCULAR | Status: DC
  Administered 2023-11-21: 3 [IU] via SUBCUTANEOUS
  Filled 2023-11-20: qty 1

## 2023-11-20 MED ORDER — DORZOLAMIDE HCL-TIMOLOL MAL 2-0.5 % OP SOLN
1.0000 [drp] | Freq: Two times a day (BID) | OPHTHALMIC | Status: DC
  Administered 2023-11-21 – 2023-11-22 (×3): 1 [drp] via OPHTHALMIC
  Filled 2023-11-20: qty 10

## 2023-11-20 MED ORDER — ACETAMINOPHEN 325 MG PO TABS: 650.0000 mg | ORAL_TABLET | Freq: Four times a day (QID) | ORAL | Status: DC | PRN

## 2023-11-20 MED ORDER — ONDANSETRON HCL 4 MG/2ML IJ SOLN: 4.0000 mg | Freq: Four times a day (QID) | INTRAMUSCULAR | Status: DC | PRN

## 2023-11-20 MED ORDER — ACETAMINOPHEN 650 MG RE SUPP
650.0000 mg | Freq: Four times a day (QID) | RECTAL | Status: DC | PRN
Start: 2023-11-20 — End: 2023-11-22

## 2023-11-20 MED ORDER — PANTOPRAZOLE SODIUM 40 MG PO TBEC
40.0000 mg | DELAYED_RELEASE_TABLET | Freq: Two times a day (BID) | ORAL | Status: DC
  Administered 2023-11-21 – 2023-11-22 (×4): 40 mg via ORAL
  Filled 2023-11-20 (×4): qty 1

## 2023-11-20 MED ORDER — ONDANSETRON HCL 4 MG PO TABS: 4.0000 mg | ORAL_TABLET | Freq: Four times a day (QID) | ORAL | Status: DC | PRN

## 2023-11-20 MED ORDER — IPRATROPIUM-ALBUTEROL 0.5-2.5 (3) MG/3ML IN SOLN
3.0000 mL | Freq: Once | RESPIRATORY_TRACT | Status: AC
  Administered 2023-11-20: 3 mL via RESPIRATORY_TRACT
  Filled 2023-11-20: qty 3

## 2023-11-20 MED ORDER — ASPIRIN 81 MG PO TBEC
81.0000 mg | DELAYED_RELEASE_TABLET | Freq: Every day | ORAL | Status: DC
  Administered 2023-11-21 – 2023-11-22 (×2): 81 mg via ORAL
  Filled 2023-11-20 (×2): qty 1

## 2023-11-20 MED ORDER — HYDROCODONE-ACETAMINOPHEN 5-325 MG PO TABS
1.0000 | ORAL_TABLET | ORAL | Status: DC | PRN
Start: 2023-11-20 — End: 2023-11-21

## 2023-11-20 NOTE — Assessment & Plan Note (Addendum)
Hypotension-dehydration Suspect ATN related to renal hypoperfusion from hypotension and dehydration Small IV fluid bolus and encourage oral fluids Hold antihypertensives-amlodipine and bisoprolol and resume as appropriate

## 2023-11-20 NOTE — H&P (Signed)
History and Physical    Patient: Rachel Nielsen XBM:[id number] DOB: [birth year] DOA: 11/20/2023 DOS: the patient was seen and examined on 11/20/2023 PCP: Alm Bustard, NP  Patient coming from: Home  Chief Complaint:  Chief Complaint  Patient presents with   Fall   Weakness    HPI: Rachel Nielsen is a 73 y.o. female with medical history significant for COPD, DM hypertension, CAD, anxiety, morbid obesity, OSA, and multiple strokes with residual right-sided weakness, dependent on a walker, who lives with her son, who presents to the ED for evaluation of weakness in the context of a fall the day prior.  States she fell while using her walker in the kitchen  With point of impact being her right shoulder and right side of her face.  She had no loss of consciousness and following the fall, her son was able to help her up.  The following day she awoke and was at her baseline however as the day progressed she felt increasing weakness in both her legs, and could not stand up without the assistance of her son, which is unusual.  She denied back pain of neck pain, denied bowel or bladder retention or incontinence and denied saddle anesthesia.  At the time of my evaluation she felt like she was getting back to her strength in her lower extremities.  She denies feeling unwell recently.  Denies any cough or congestion, fever or chills, nausea vomiting or diarrhea or abdominal pain or dysuria. ED course and Data review: soft BP to 98/78, improved on its own to 117/72. Labs notable for baseline anemia of 10.9,, creatinine 1.07 above baseline of 0.77 and urinalysis with moderate leukocytes.  Normal WBC.  Respiratory viral panel negative. EKG with NSR at 79 and T wave inversions lateral leads Trauma imaging including chest x-ray CT head MRI brain and right shoulder x-ray all nonacute however CT head did show Possible small left chronic subdural collection/hemorrhage - not seen on MRI. Patient was noted to be  wheezing in the ED and was treated with DuoNebs and prednisone Hospitalist consulted for admission.   Review of Systems: As mentioned in the history of present illness. All other systems reviewed and are negative.  Past Medical History:  Diagnosis Date   Acute metabolic encephalopathy 09/28/2023   Allergy    Anxiety    Asthma    Colon polyps    Constipation    COPD (chronic obstructive pulmonary disease) (HCC)    Depression    GERD (gastroesophageal reflux disease)    Glaucoma    Heart attack (HCC)    Hypertension    Hypokalemia 09/28/2023   Sleep apnea    Sleep apnea    Stroke [facility])    Urinary incontinence    Past Surgical History:  Procedure Laterality Date   APPENDECTOMY     CHOLECYSTECTOMY     COLONOSCOPY WITH PROPOFOL N/A 07/20/2018   Procedure: COLONOSCOPY WITH PROPOFOL;  Surgeon: Toledo, Boykin Nearing, MD;  Location: [facility] ENDOSCOPY;  Service: Gastroenterology;  Laterality: N/A;   ESOPHAGOGASTRODUODENOSCOPY N/A 07/20/2018   Procedure: ESOPHAGOGASTRODUODENOSCOPY (EGD);  Surgeon: Toledo, Boykin Nearing, MD;  Location: [facility] ENDOSCOPY;  Service: Gastroenterology;  Laterality: N/A;   GASTRECTOMY     TUBAL LIGATION     VAGINAL HYSTERECTOMY     Social History:  reports that she has quit smoking. She has never used smokeless tobacco. She reports that she does not currently use alcohol. She reports that she does not use drugs.  Allergies  Allergen Reactions   Ace Inhibitors Cough    Family History  Problem Relation Age of Onset   Hypertension Mother    Atrial fibrillation Mother    Heart attack Mother    Hypertension Father    Stomach cancer Father    Diabetes Father    Liver cancer Father    Diabetes Sister    Hypertension Sister     Prior to Admission medications   Medication Sig Start Date End Date Taking? Authorizing Provider  ACCU-CHEK GUIDE test strip Use to check blood sugar up to twice a day as directed 05/26/22   [name], NP  Accu-Chek Softclix Lancets  lancets Use to check blood sugar up to twice a day as directed 05/26/22   [name], NP  albuterol (PROVENTIL) (2.5 MG/3ML) 0.083% nebulizer solution Inhale 3 mLs (2.5 mg total) into the lungs every 4 (four) hours as needed for wheezing or shortness of breath. Patient taking differently: Inhale 2.5 mg into the lungs every 6 (six) hours as needed for wheezing or shortness of breath. 12/23/21 09/28/23  Arnetha Courser, MD  albuterol (VENTOLIN HFA) 108 (90 Base) MCG/ACT inhaler Inhale 2-4 puffs by mouth every 4 hours as needed for wheezing, cough, and/or shortness of breath Patient taking differently: 2 puffs every 6 (six) hours as needed. 10/21/20   Malfi, Jodelle Gross, FNP  ALPRAZolam Prudy Feeler) 1 MG tablet Take 1 mg by mouth 3 (three) times daily as needed for anxiety or sleep. 08/19/21   [provider]  amLODipine (NORVASC) 5 MG tablet Take 1 tablet (5 mg total) by mouth daily. 06/29/23   [name], NP  Asenapine Maleate 10 MG SUBL Place 10 mg under the tongue at bedtime. 04/22/21   [provider]  aspirin 81 MG EC tablet Take 1 tablet (81 mg total) by mouth daily. Swallow whole. 02/10/22   [name], NP  bisoprolol (ZEBETA) 10 MG tablet Take 1 tablet (10 mg total) by mouth daily. OFFICE VISIT NEEDED FOR ADDITIONAL REFILLS 04/26/23   [name], NP  brimonidine (ALPHAGAN) 0.2 % ophthalmic solution 1 drop 3 (three) times daily. 09/09/23   [provider]  buPROPion (WELLBUTRIN XL) 300 MG 24 hr tablet Take 1 tablet (300 mg total) by mouth daily. 10/08/20   Sharman Cheek, MD  busPIRone (BUSPAR) 15 MG tablet Take 15 mg by mouth 2 (two) times daily. 11/08/17   [provider]  clopidogrel (PLAVIX) 75 MG tablet Take 75 mg by mouth daily.    [provider]  divalproex (DEPAKOTE) 250 MG DR tablet Take 250 mg by mouth 2 (two) times daily.    [provider]  docusate sodium (COLACE) 100 MG capsule Take 100 mg by mouth daily.    [provider]  dorzolamide-timolol (COSOPT) 2-0.5 % ophthalmic solution Place 1 drop into both eyes 2 (two) times daily. 06/08/23   [provider]  Dulaglutide 3 MG/0.5ML SOPN Inject 0.5 mLs (3 mg total) subcutaneously once a week 01/07/22   [provider]  famotidine (PEPCID) 20 MG tablet Take 20 mg by mouth 2 (two) times daily.    [provider]  FARXIGA 10 MG TABS tablet Take 10 mg by mouth daily. 09/08/21   [provider]  fluticasone (FLONASE) 50 MCG/ACT nasal spray Place 1 spray into both nostrils daily. 02/16/23   [name], NP  fluticasone-salmeterol (ADVAIR) 250-50 MCG/ACT AEPB Inhale 1 puff into the lungs 2 (two) times daily. 09/09/21  [provider]  gabapentin (NEURONTIN) 600 MG tablet Take 600-900 mg by mouth 3 (three) times daily. 600 mg AM, 600 mg pm and 900 mg at bedtime 12/16/20   [provider]  GLOBAL EASE INJECT PEN NEEDLES 32G X 4 MM MISC Use as directed with insulin 05/30/20   Scarboro, Coralee North, NP  Insulin Pen Needle (BD PEN NEEDLE NANO U/F) 32G X 4 MM MISC Inject 1 Device into the skin daily. 05/19/21   [name], NP  Insulin Syringe-Needle U-100 (INSULIN SYRINGE 1CC/31GX5/16") 31G X 5/16" 1 ML MISC 1 Syringe by Does not apply route in the morning, at noon, in the evening, and at bedtime. 06/24/21   [name], NP  linaclotide Karlene Einstein) 290 MCG CAPS capsule Take 1 capsule (290 mcg total) by mouth daily before breakfast. 04/12/23   [name], NP  metFORMIN (GLUCOPHAGE) 1000 MG tablet Take 1,000 mg by mouth 2 (two) times daily with a meal. 01/21/22   [provider]  montelukast (SINGULAIR) 10 MG tablet Take 1 tablet (10 mg total) by mouth daily. 10/08/20   Sharman Cheek, MD  MYRBETRIQ 25 MG TB24 tablet Take 25 mg by mouth daily. 09/20/23   [provider]  pantoprazole (PROTONIX) 40 MG tablet Take 1 tablet (40 mg total) by mouth 2 (two) times daily. 09/14/23   [name], NP   pregabalin (LYRICA) 75 MG capsule Take 75 mg by mouth 3 (three) times daily. 09/20/23   [provider]  rosuvastatin (CRESTOR) 10 MG tablet Take 1 tablet (10 mg total) by mouth daily. 09/14/23   [name], NP  TRAVATAN Z 0.004 % SOLN ophthalmic solution Place 1 drop into both eyes at bedtime. 10/08/20   Sharman Cheek, MD    Physical Exam: Vitals:   11/20/23 1547 11/20/23 1833 11/20/23 2001 11/20/23 2001  BP:  96/78  117/72  Pulse:  72  79  Resp:  18  16  Temp:  98 F (36.7 C)  97.6 F (36.4 C)  TempSrc:  Oral  Oral  SpO2:  90% 91% 94%  Weight: 117.9 kg     Height: 5\' 4"  (1.626 m)      Physical Exam Vitals and nursing note reviewed.  Constitutional:      General: She is not in acute distress. HENT:     Head: Normocephalic and atraumatic.  Eyes:     Comments: Right periorbital contusion  Cardiovascular:     Rate and Rhythm: Normal rate and regular rhythm.     Heart sounds: Normal heart sounds.  Pulmonary:     Effort: Pulmonary effort is normal.     Breath sounds: Normal breath sounds.  Abdominal:     Palpations: Abdomen is soft.     Tenderness: There is no abdominal tenderness.  Neurological:     Mental Status: Mental status is at baseline.     Comments: Motor function equal bilateral lower extremities     Labs on Admission: I have personally reviewed following labs and imaging studies  CBC: Recent Labs  Lab 11/20/23 1551  WBC 9.5  HGB 10.9*  HCT 38.3  MCV 78.5*  PLT 209   Basic Metabolic Panel: Recent Labs  Lab 11/20/23 1551  NA 140  K 3.6  CL 104  CO2 23  GLUCOSE 96  BUN 21  CREATININE 1.07*  CALCIUM 8.7*   GFR: Estimated Creatinine Clearance: 61.8 mL/min (A) (by C-G formula based on SCr of 1.07 mg/dL (H)). Liver  Function Tests: No results for input(s): "AST", "ALT", "ALKPHOS", "BILITOT", "PROT", "ALBUMIN" in the last 168 hours. No results for input(s): "LIPASE", "AMYLASE" in the last 168 hours. No results for input(s):  "AMMONIA" in the last 168 hours. Coagulation Profile: No results for input(s): "INR", "PROTIME" in the last 168 hours. Cardiac Enzymes: No results for input(s): "CKTOTAL", "CKMB", "CKMBINDEX", "TROPONINI" in the last 168 hours. BNP (last 3 results) No results for input(s): "PROBNP" in the last 8760 hours. HbA1C: No results for input(s): "HGBA1C" in the last 72 hours. CBG: No results for input(s): "GLUCAP" in the last 168 hours. Lipid Profile: No results for input(s): "CHOL", "HDL", "LDLCALC", "TRIG", "CHOLHDL", "LDLDIRECT" in the last 72 hours. Thyroid Function Tests: No results for input(s): "TSH", "T4TOTAL", "FREET4", "T3FREE", "THYROIDAB" in the last 72 hours. Anemia Panel: No results for input(s): "VITAMINB12", "FOLATE", "FERRITIN", "TIBC", "IRON", "RETICCTPCT" in the last 72 hours. Urine analysis:    Component Value Date/Time   COLORURINE YELLOW (A) 11/20/2023 2038   APPEARANCEUR HAZY (A) 11/20/2023 2038   APPEARANCEUR Clear 01/26/2022 1104   LABSPEC 1.027 11/20/2023 2038   PHURINE 5.0 11/20/2023 2038   GLUCOSEU >=500 (A) 11/20/2023 2038   HGBUR NEGATIVE 11/20/2023 2038   BILIRUBINUR NEGATIVE 11/20/2023 2038   BILIRUBINUR Negative 01/26/2022 1104   KETONESUR 5 (A) 11/20/2023 2038   PROTEINUR 30 (A) 11/20/2023 2038   NITRITE NEGATIVE 11/20/2023 2038   LEUKOCYTESUR MODERATE (A) 11/20/2023 2038    Radiological Exams on Admission: MR BRAIN WO CONTRAST Result Date: 11/20/2023 CLINICAL DATA:  Left lower extremity weakness EXAM: MRI HEAD WITHOUT CONTRAST TECHNIQUE: Multiplanar, multiecho pulse sequences of the brain and surrounding structures were obtained without intravenous contrast. COMPARISON:  09/28/2023 FINDINGS: Brain: No acute infarct, mass effect or extra-axial collection. No acute or chronic hemorrhage. There is multifocal hyperintense T2-weighted signal within the white matter. Parenchymal volume and CSF spaces are normal. A partially empty sella turcica is incidentally  noted. Vascular: Normal flow voids. Skull and upper cervical spine: Normal calvarium and skull base. Visualized upper cervical spine and soft tissues are normal. Sinuses/Orbits:No paranasal sinus fluid levels or advanced mucosal thickening. No mastoid or middle ear effusion. Normal orbits. IMPRESSION: 1. No acute intracranial abnormality. 2. Findings of chronic small vessel ischemia. Electronically Signed   By: Deatra Robinson M.D.   On: 11/20/2023 22:04   CT HEAD WO CONTRAST ( ) Result Date: 11/20/2023 CLINICAL DATA:  Fall.  Follow-up head CT EXAM: CT HEAD WITHOUT CONTRAST TECHNIQUE: Contiguous axial images were obtained from the base of the skull through the vertex without intravenous contrast. RADIATION DOSE REDUCTION: This exam was performed according to the departmental dose-optimization program which includes automated exposure control, adjustment of the mA and/or kV according to patient size and/or use of iterative reconstruction technique. COMPARISON:  Same day CT performed 4 hours earlier FINDINGS: Brain: Unchanged appearance of slightly hyperattenuating extra-axial fluid over the left frontal convexity measuring up to 5 mm in thickness. No mass effect or midline shift. No evidence of intraparenchymal hemorrhage no intraventricular hemorrhage. No evidence of acute infarction. Mild-to-moderate cerebral volume loss. Vascular: No hyperdense vessel or unexpected calcification. Skull: Normal. Negative for fracture or focal lesion. Sinuses/Orbits: No acute finding. Other: Negative for scalp hematoma. IMPRESSION: Unchanged appearance of slightly hyperattenuating extra-axial fluid over the left frontal convexity measuring up to 5 mm in thickness. Appearance remains suspicious for a small chronic subdural hematoma. No mass effect or midline shift. Electronically Signed   By: Duanne Guess D.O.   On: 11/20/2023 20:42  DG Shoulder Right Result Date: 11/20/2023 CLINICAL DATA:  Fall, right shoulder pain EXAM:  RIGHT SHOULDER - 2+ VIEW COMPARISON:  None Available. FINDINGS: Degenerative changes in the right [facility] and glenohumeral joints with joint space narrowing and spurring. No acute bony abnormality. Specifically, no fracture, subluxation, or dislocation. IMPRESSION: No acute bony abnormality. Electronically Signed   By: Charlett Nose M.D.   On: 11/20/2023 17:05   CT Head Wo Contrast Result Date: 11/20/2023 CLINICAL DATA:  Larey Seat yesterday.  Bruising to the right eye. EXAM: CT HEAD WITHOUT CONTRAST TECHNIQUE: Contiguous axial images were obtained from the base of the skull through the vertex without intravenous contrast. RADIATION DOSE REDUCTION: This exam was performed according to the departmental dose-optimization program which includes automated exposure control, adjustment of the mA and/or kV according to patient size and/or use of iterative reconstruction technique. COMPARISON:  09/28/2023. FINDINGS: Brain: Widened extra-axial space over the lateral left frontal lobe. Within this area, the fluid has attenuation higher than CSF. This widening is also new from the prior exam. This suggests a small chronic subdural hemorrhage. No evidence of acute intracranial hemorrhage. No parenchymal masses. No midline shift. No evidence of infarction and no hydrocephalus. Vascular: No hyperdense vessel or unexpected calcification. Skull: Normal. Negative for fracture or focal lesion. Sinuses/Orbits: Globes and orbits are unremarkable. Visualized sinuses are clear. Other: None. IMPRESSION: 1. Possible small left chronic subdural collection/hemorrhage. This is equivocal, but not evident the CT scan from 09/28/2023. This could be further assessed with brain MRI. 2. No other evidence an acute or recent. Electronically Signed   By: Amie Portland M.D.   On: 11/20/2023 16:25   DG Chest 2 View Result Date: 11/20/2023 CLINICAL DATA:  COPD.  Status post fall. EXAM: CHEST - 2 VIEW COMPARISON:  09/29/2023 FINDINGS: Normal scratch set stable  cardiomediastinal contours. Low lung volumes with chronic asymmetric elevation of the right hemidiaphragm. No pleural fluid, interstitial edema or airspace consolidation. The visualized osseous structures appear unremarkable. IMPRESSION: 1. No acute cardiopulmonary abnormalities. 2. Low lung volumes with chronic asymmetric elevation of the right hemidiaphragm. Electronically Signed   By: Signa Kell M.D.   On: 11/20/2023 16:10     Data Reviewed: Relevant notes from primary care and specialist visits, past discharge summaries as available in EHR, including Care Everywhere. Prior diagnostic testing as pertinent to current admission diagnoses Updated medications and problem lists for reconciliation ED course, including vitals, labs, imaging, treatment and response to treatment Triage notes, nursing and pharmacy notes and ED provider's notes Notable results as noted in HPI   Assessment and Plan: * Fall at home, initial encounter Facial contusion Generalized weakness and physical deconditioning Right hemiparesis due to prior stroke In part secondary to UTI and dehydration.  No evidence for stroke at this time CT head and MRI brain negative for acute internal injury Getting CT C- spine PT eval/TOC  Urinary tract infection Follow cultures. Antibiotics not started at this time   AKI (acute kidney injury) (HCC) Hypotension-dehydration Suspect ATN related to renal hypoperfusion from hypotension and dehydration Small IV fluid bolus and encourage oral fluids Hold antihypertensives-amlodipine and bisoprolol and resume as appropriate  COPD with acute exacerbation (HCC) Possible mild exacerbation-mild wheezing on exam Continue home inhalers DuoNebs as needed Will continue prednisone started in the ED  Hypertension Holding antihypertensives to resume as appropriate  History of CVA (cerebrovascular accident) MRI negative for acute stroke Continue home meds  Chronic  anemia Stable   Diabetes mellitus without complication (HCC) Sliding scale  insulin coverage.  Hold oral meds  Obesity, Class III, BMI 40-49.9 (morbid obesity) (HCC) Complicating factor to overall prognosis and care  Coronary artery disease Continue rosuvastatin and aspirin.  Holding bisoprolol and amlodipine until blood pressure more robust  OSA (obstructive sleep apnea) CPAP nightly if desired  Anxiety and depression Continue BuSpar, Wellbutrin and alprazolam pending med rec        DVT prophylaxis: Lovenox  Consults: none  Advance Care Planning:   Code Status: Prior   Family Communication: none  Disposition Plan: Back to previous home environment  Severity of Illness: The appropriate patient status for this patient is OBSERVATION. Observation status is judged to be reasonable and necessary in order to provide the required intensity of service to ensure the patient's safety. The patient's presenting symptoms, physical exam findings, and initial radiographic and laboratory data in the context of their medical condition is felt to place them at decreased risk for further clinical deterioration. Furthermore, it is anticipated that the patient will be medically stable for discharge from the hospital within 2 midnights of admission.   Author: Andris Baumann, MD 11/20/2023 10:46 PM  For on call review [email or web address].

## 2023-11-20 NOTE — Assessment & Plan Note (Signed)
MRI negative for acute stroke Continue home meds

## 2023-11-20 NOTE — Assessment & Plan Note (Addendum)
Follow cultures. Antibiotics not started at this time

## 2023-11-20 NOTE — Assessment & Plan Note (Signed)
Holding antihypertensives to resume as appropriate

## 2023-11-20 NOTE — ED Triage Notes (Signed)
Pt reports she was pushing her walker and tried to put some trash in trash can and lost balance. Pt reports she fell flat on her face. Pt has visible bruising to right eye. Pt denies losing consciousness. In addition, pt has expiratory wheezing, pt reports she has COPD.  Pt has slow and soft speech, unsure of baseline. Per EMS note family reported pt is not at her baseline.

## 2023-11-20 NOTE — Assessment & Plan Note (Signed)
Possible mild exacerbation-mild wheezing on exam Continue home inhalers DuoNebs as needed Will continue prednisone started in the ED

## 2023-11-20 NOTE — Assessment & Plan Note (Signed)
Sliding scale insulin coverage.  Hold oral meds

## 2023-11-20 NOTE — Assessment & Plan Note (Signed)
Stable

## 2023-11-20 NOTE — Assessment & Plan Note (Signed)
Complicating factor to overall prognosis and care 

## 2023-11-20 NOTE — Assessment & Plan Note (Signed)
CPAP nightly if desired °

## 2023-11-20 NOTE — Progress Notes (Signed)
Anticoagulation monitoring(Lovenox):  73 yo female ordered Lovenox 40 mg Q24h    Filed Weights   11/20/23 1547  Weight: 117.9 kg (260 lb)   BMI 44.6   Lab Results  Component Value Date   CREATININE 1.07 (H) 11/20/2023   CREATININE 0.77 09/29/2023   CREATININE 1.05 (H) 09/28/2023   Estimated Creatinine Clearance: 61.8 mL/min (A) (by C-G formula based on SCr of 1.07 mg/dL (H)). Hemoglobin & Hematocrit     Component Value Date/Time   HGB 10.9 (L) 11/20/2023 1551   HCT 38.3 11/20/2023 1551     Per Protocol for Patient with estCrcl > 30 ml/min and BMI > 30, will transition to Lovenox 60 mg Q24h.

## 2023-11-20 NOTE — Assessment & Plan Note (Addendum)
Facial contusion Generalized weakness and physical deconditioning Right hemiparesis due to prior stroke In part secondary to UTI and dehydration.  No evidence for stroke at this time CT head and MRI brain negative for acute internal injury Getting CT C- spine PT eval/TOC

## 2023-11-20 NOTE — Assessment & Plan Note (Signed)
Continue rosuvastatin and aspirin.  Holding bisoprolol and amlodipine until blood pressure more robust

## 2023-11-20 NOTE — ED Triage Notes (Signed)
First nurse note: Patient arrived by EMS from home for fall. Fell yesterday. Bruise noted to Right eye. Family reports patient is off her baseline. Reports weakness all over  History stroke  EMS vitals: 141/84b/p 81HR 97% RA

## 2023-11-20 NOTE — ED Provider Notes (Signed)
[facility] Provider Note    Event Date/Time   First MD Initiated Contact with Patient 11/20/23 1636     (approximate)   History   Fall and Weakness   HPI  Rachel Nielsen is a 73 y.o. female who presents to the ED for evaluation of Fall and Weakness   Review of medical DC summary from 11/27.  History of COPD, anxiety, HTN, CAD and multiple previous strokes.  Residual right-sided weakness, walker dependent.  Admitted for encephalopathy in setting of community-acquired pneumonia, sepsis criteria.  Patient presents to the ED from home for evaluation of a fall that occurred yesterday as well as generalized weakness, cough for the past "couple days."  She provides majority of history and is here by herself, though reports living at home with her son.   Physical Exam   Triage Vital Signs: ED Triage Vitals  Encounter Vitals Group     BP 11/20/23 1546 105/77     Systolic BP Percentile --      Diastolic BP Percentile --      Pulse Rate 11/20/23 1546 78     Resp 11/20/23 1546 20     Temp 11/20/23 1546 97.7 F (36.5 C)     Temp Source 11/20/23 1546 Oral     SpO2 11/20/23 1546 95 %     Weight 11/20/23 1547 260 lb (117.9 kg)     Height 11/20/23 1547 5\' 4"  (1.626 m)     Head Circumference --      Peak Flow --      Pain Score 11/20/23 1546 0     Pain Loc --      Pain Education --      Exclude from Growth Chart --     Most recent vital signs: Vitals:   11/20/23 2001 11/20/23 2356  BP: 117/72   Pulse: 79   Resp: 16   Temp: 97.6 F (36.4 C) 97.6 F (36.4 C)  SpO2: 94%     General: Awake, no distress.  Pleasant and conversational CV:  Good peripheral perfusion.  Resp:  Normal effort.  Wheezing throughout with slightly decreased airflow Abd:  No distention.  Soft MSK:  No deformity noted.  Neuro:  No focal deficits appreciated.  Right-sided deficits, reportedly chronic Other:     ED Results / Procedures / Treatments   Labs (all labs ordered  are listed, but only abnormal results are displayed) Labs Reviewed  BASIC METABOLIC PANEL - Abnormal; Notable for the following components:      Result Value   Creatinine, Ser 1.07 (*)    Calcium 8.7 (*)    GFR, Estimated 56 (*)    All other components within normal limits  CBC - Abnormal; Notable for the following components:   Hemoglobin 10.9 (*)    MCV 78.5 (*)    MCH 22.3 (*)    MCHC 28.5 (*)    RDW 18.6 (*)    All other components within normal limits  URINALYSIS, ROUTINE W REFLEX MICROSCOPIC - Abnormal; Notable for the following components:   Color, Urine YELLOW (*)    APPearance HAZY (*)    Glucose, UA >=500 (*)    Ketones, ur 5 (*)    Protein, ur 30 (*)    Leukocytes,Ua MODERATE (*)    Bacteria, UA RARE (*)    All other components within normal limits  CBG MONITORING, ED - Abnormal; Notable for the following components:   Glucose-Capillary 148 (*)  All other components within normal limits  RESP PANEL BY RT-PCR (RSV, FLU A&B, COVID)  [id number]  URINE CULTURE  URINALYSIS, W/ REFLEX TO CULTURE (INFECTION SUSPECTED)  CBC  BASIC METABOLIC PANEL    EKG Sinus rhythm with a rate of 79 bpm.  Normal axis and intervals.  No clear signs of acute ischemia.  RADIOLOGY CXR interpreted by me without evidence of acute cardiopulmonary pathology. CT head interpreted by me with possible left frontal small subdural hematoma  Official radiology report(s): MR BRAIN WO CONTRAST Result Date: 11/20/2023 CLINICAL DATA:  Left lower extremity weakness EXAM: MRI HEAD WITHOUT CONTRAST TECHNIQUE: Multiplanar, multiecho pulse sequences of the brain and surrounding structures were obtained without intravenous contrast. COMPARISON:  09/28/2023 FINDINGS: Brain: No acute infarct, mass effect or extra-axial collection. No acute or chronic hemorrhage. There is multifocal hyperintense T2-weighted signal within the white matter. Parenchymal volume and CSF spaces are normal. A partially empty sella turcica  is incidentally noted. Vascular: Normal flow voids. Skull and upper cervical spine: Normal calvarium and skull base. Visualized upper cervical spine and soft tissues are normal. Sinuses/Orbits:No paranasal sinus fluid levels or advanced mucosal thickening. No mastoid or middle ear effusion. Normal orbits. IMPRESSION: 1. No acute intracranial abnormality. 2. Findings of chronic small vessel ischemia. Electronically Signed   By: Deatra Robinson M.D.   On: 11/20/2023 22:04   CT HEAD WO CONTRAST ( ) Result Date: 11/20/2023 CLINICAL DATA:  Fall.  Follow-up head CT EXAM: CT HEAD WITHOUT CONTRAST TECHNIQUE: Contiguous axial images were obtained from the base of the skull through the vertex without intravenous contrast. RADIATION DOSE REDUCTION: This exam was performed according to the departmental dose-optimization program which includes automated exposure control, adjustment of the mA and/or kV according to patient size and/or use of iterative reconstruction technique. COMPARISON:  Same day CT performed 4 hours earlier FINDINGS: Brain: Unchanged appearance of slightly hyperattenuating extra-axial fluid over the left frontal convexity measuring up to 5 mm in thickness. No mass effect or midline shift. No evidence of intraparenchymal hemorrhage no intraventricular hemorrhage. No evidence of acute infarction. Mild-to-moderate cerebral volume loss. Vascular: No hyperdense vessel or unexpected calcification. Skull: Normal. Negative for fracture or focal lesion. Sinuses/Orbits: No acute finding. Other: Negative for scalp hematoma. IMPRESSION: Unchanged appearance of slightly hyperattenuating extra-axial fluid over the left frontal convexity measuring up to 5 mm in thickness. Appearance remains suspicious for a small chronic subdural hematoma. No mass effect or midline shift. Electronically Signed   By: Duanne Guess D.O.   On: 11/20/2023 20:42   DG Shoulder Right Result Date: 11/20/2023 CLINICAL DATA:  Fall, right  shoulder pain EXAM: RIGHT SHOULDER - 2+ VIEW COMPARISON:  None Available. FINDINGS: Degenerative changes in the right [facility] and glenohumeral joints with joint space narrowing and spurring. No acute bony abnormality. Specifically, no fracture, subluxation, or dislocation. IMPRESSION: No acute bony abnormality. Electronically Signed   By: Charlett Nose M.D.   On: 11/20/2023 17:05   CT Head Wo Contrast Result Date: 11/20/2023 CLINICAL DATA:  Larey Seat yesterday.  Bruising to the right eye. EXAM: CT HEAD WITHOUT CONTRAST TECHNIQUE: Contiguous axial images were obtained from the base of the skull through the vertex without intravenous contrast. RADIATION DOSE REDUCTION: This exam was performed according to the departmental dose-optimization program which includes automated exposure control, adjustment of the mA and/or kV according to patient size and/or use of iterative reconstruction technique. COMPARISON:  09/28/2023. FINDINGS: Brain: Widened extra-axial space over the lateral left frontal lobe. Within this area, the fluid  has attenuation higher than CSF. This widening is also new from the prior exam. This suggests a small chronic subdural hemorrhage. No evidence of acute intracranial hemorrhage. No parenchymal masses. No midline shift. No evidence of infarction and no hydrocephalus. Vascular: No hyperdense vessel or unexpected calcification. Skull: Normal. Negative for fracture or focal lesion. Sinuses/Orbits: Globes and orbits are unremarkable. Visualized sinuses are clear. Other: None. IMPRESSION: 1. Possible small left chronic subdural collection/hemorrhage. This is equivocal, but not evident the CT scan from 09/28/2023. This could be further assessed with brain MRI. 2. No other evidence an acute or recent. Electronically Signed   By: Amie Portland M.D.   On: 11/20/2023 16:25   DG Chest 2 View Result Date: 11/20/2023 CLINICAL DATA:  COPD.  Status post fall. EXAM: CHEST - 2 VIEW COMPARISON:  09/29/2023 FINDINGS: Normal  scratch set stable cardiomediastinal contours. Low lung volumes with chronic asymmetric elevation of the right hemidiaphragm. No pleural fluid, interstitial edema or airspace consolidation. The visualized osseous structures appear unremarkable. IMPRESSION: 1. No acute cardiopulmonary abnormalities. 2. Low lung volumes with chronic asymmetric elevation of the right hemidiaphragm. Electronically Signed   By: Signa Kell M.D.   On: 11/20/2023 16:10    PROCEDURES and INTERVENTIONS:  Procedures  Medications  aspirin EC tablet 81 mg (has no administration in time range)  rosuvastatin (CRESTOR) tablet 10 mg (has no administration in time range)  mirabegron ER (MYRBETRIQ) tablet 25 mg (has no administration in time range)  pantoprazole (PROTONIX) EC tablet 40 mg (40 mg Oral Given 11/21/23 0003)  brimonidine (ALPHAGAN) 0.2 % ophthalmic solution 1 drop (has no administration in time range)  dorzolamide-timolol (COSOPT) 2-0.5 % ophthalmic solution 1 drop (has no administration in time range)  enoxaparin (LOVENOX) injection 60 mg (60 mg Subcutaneous Given 11/21/23 0004)  acetaminophen (TYLENOL) tablet 650 mg (has no administration in time range)    Or  acetaminophen (TYLENOL) suppository 650 mg (has no administration in time range)  ondansetron (ZOFRAN) tablet 4 mg (has no administration in time range)    Or  ondansetron (ZOFRAN) injection 4 mg (has no administration in time range)  insulin aspart (novoLOG) injection 0-15 Units (has no administration in time range)  insulin aspart (novoLOG) injection 0-5 Units ( Subcutaneous Not Given 11/21/23 0000)  sodium chloride 0.9 % bolus 1,000 mL (has no administration in time range)  HYDROcodone-acetaminophen (NORCO/VICODIN) 5-325 MG per tablet 1-2 tablet (has no administration in time range)  methylPREDNISolone sodium succinate (SOLU-MEDROL) 40 mg/mL injection 40 mg (has no administration in time range)    Followed by  predniSONE (DELTASONE) tablet 40 mg (has  no administration in time range)  ipratropium-albuterol (DUONEB) 0.5-2.5 (3) MG/3ML nebulizer solution 3 mL (has no administration in time range)  albuterol (PROVENTIL) (2.5 MG/3ML) 0.083% nebulizer solution 2.5 mg (has no administration in time range)  acetaminophen (TYLENOL) tablet 1,000 mg (1,000 mg Oral Given 11/20/23 1712)  predniSONE (DELTASONE) tablet 60 mg (60 mg Oral Given 11/20/23 1712)  ipratropium-albuterol (DUONEB) 0.5-2.5 (3) MG/3ML nebulizer solution 6 mL (6 mLs Nebulization Given 11/20/23 1714)  ipratropium-albuterol (DUONEB) 0.5-2.5 (3) MG/3ML nebulizer solution 3 mL (3 mLs Nebulization Given 11/20/23 2016)     IMPRESSION / MDM / ASSESSMENT AND PLAN / ED COURSE  I reviewed the triage vital signs and the nursing notes.  Differential diagnosis includes, but is not limited to, stroke, postictal, cauda equina, metabolic encephalopathy, deconditioning, sepsis  {Patient presents with symptoms of an acute illness or injury that is potentially life-threatening.  Patient  presents 1 day after a fall with evidence of a COPD exacerbation, AKI and generalized weakness requiring medical admission.  Wheezing on exam improving with breathing treatments but still persists.  Started on steroids.  Small ketones in the urine are suggestive of dehydration.  Negative viral swabs.  Normal CBC.  Renal function worsening on metabolic panel.    Clinical Course as of 11/21/23 0010  Sat Nov 20, 2023  1759 I call her son, Dorena Cookey. Wanted her to come yesterday bc she fell 6a Friday morning, but she refused EMS then. Eye more swollen, bruising.  [DS]  2006 Reassessed and discussed plan of care.  Still with some wheezing so we will give another breathing treatment. [DS]  2047 Repeat CT with similar appearing chronic subdural [DS]  2112 Reassessed and discussed plan of care [DS]    Clinical Course User Index [DS] Delton Prairie, MD     FINAL CLINICAL IMPRESSION(S) / ED DIAGNOSES   Final diagnoses:   Generalized weakness  AKI (acute kidney injury) (HCC)  COPD exacerbation (HCC)     Rx / DC Orders   ED Discharge Orders     None        Note:  This document was prepared using Dragon voice recognition software and may include unintentional dictation errors.   Delton Prairie, MD 11/21/23 Burna Mortimer

## 2023-11-20 NOTE — Assessment & Plan Note (Signed)
Continue BuSpar, Wellbutrin and alprazolam pending med rec

## 2023-11-21 ENCOUNTER — Observation Stay: Payer: Medicare PPO

## 2023-11-21 DIAGNOSIS — I10 Essential (primary) hypertension: Secondary | ICD-10-CM | POA: Diagnosis not present

## 2023-11-21 DIAGNOSIS — R531 Weakness: Secondary | ICD-10-CM | POA: Diagnosis not present

## 2023-11-21 DIAGNOSIS — W19XXXA Unspecified fall, initial encounter: Secondary | ICD-10-CM | POA: Diagnosis not present

## 2023-11-21 DIAGNOSIS — Y92009 Unspecified place in unspecified non-institutional (private) residence as the place of occurrence of the external cause: Secondary | ICD-10-CM | POA: Diagnosis not present

## 2023-11-21 LAB — GLUCOSE, CAPILLARY
Glucose-Capillary: 146 mg/dL — ABNORMAL HIGH (ref 70–99)
Glucose-Capillary: 301 mg/dL — ABNORMAL HIGH (ref 70–99)
Glucose-Capillary: 200 mg/dL — ABNORMAL HIGH (ref 70–99)
Glucose-Capillary: 190 mg/dL — ABNORMAL HIGH (ref 70–99)

## 2023-11-21 LAB — CBC
HCT: 37.6 % (ref 36.0–46.0)
Hemoglobin: 11 g/dL — ABNORMAL LOW (ref 12.0–15.0)
MCH: 22.2 pg — ABNORMAL LOW (ref 26.0–34.0)
MCHC: 29.3 g/dL — ABNORMAL LOW (ref 30.0–36.0)
MCV: 75.8 fL — ABNORMAL LOW (ref 80.0–100.0)
Platelets: 183 10*3/uL (ref 150–400)
RBC: 4.96 MIL/uL (ref 3.87–5.11)
RDW: 18.4 % — ABNORMAL HIGH (ref 11.5–15.5)
WBC: 8.6 10*3/uL (ref 4.0–10.5)
nRBC: 0 % (ref 0.0–0.2)

## 2023-11-21 LAB — BASIC METABOLIC PANEL
Anion gap: 14 (ref 5–15)
BUN: 21 mg/dL (ref 8–23)
CO2: 23 mmol/L (ref 22–32)
Calcium: 8.3 mg/dL — ABNORMAL LOW (ref 8.9–10.3)
Chloride: 100 mmol/L (ref 98–111)
Creatinine, Ser: 0.83 mg/dL (ref 0.44–1.00)
GFR, Estimated: >60 mL/min (ref >60)
Glucose, Bld: 229 mg/dL — ABNORMAL HIGH (ref 70–99)
Potassium: 3.6 mmol/L (ref 3.5–5.1)
Sodium: 137 mmol/L (ref 135–145)

## 2023-11-21 LAB — VALPROIC ACID LEVEL: Valproic Acid Lvl: 13 ug/mL — ABNORMAL LOW (ref 50.0–100.0)

## 2023-11-21 LAB — TSH: TSH: 0.226 u[IU]/mL — ABNORMAL LOW (ref 0.350–4.500)

## 2023-11-21 LAB — VITAMIN B12: Vitamin B-12: 331 pg/mL (ref 180–914)

## 2023-11-21 LAB — CBG MONITORING, ED: Glucose-Capillary: 148 mg/dL — ABNORMAL HIGH (ref 70–99)

## 2023-11-21 LAB — HEPATITIS B SURFACE ANTIGEN: Hepatitis B Surface Ag: NONREACTIVE

## 2023-11-21 LAB — C-REACTIVE PROTEIN: CRP: 2.5 mg/dL — ABNORMAL HIGH (ref <1.0)

## 2023-11-21 MED ORDER — CLOPIDOGREL BISULFATE 75 MG PO TABS
75.0000 mg | ORAL_TABLET | Freq: Every day | ORAL | Status: DC
  Administered 2023-11-21 – 2023-11-22 (×2): 75 mg via ORAL
  Filled 2023-11-21 (×2): qty 1

## 2023-11-21 MED ORDER — LINACLOTIDE 290 MCG PO CAPS
290.0000 ug | ORAL_CAPSULE | Freq: Every day | ORAL | Status: DC
  Filled 2023-11-21 (×2): qty 1

## 2023-11-21 MED ORDER — DAPAGLIFLOZIN PROPANEDIOL 10 MG PO TABS
10.0000 mg | ORAL_TABLET | Freq: Every day | ORAL | Status: DC
  Administered 2023-11-22: 10 mg via ORAL
  Filled 2023-11-21: qty 1

## 2023-11-21 MED ORDER — LATANOPROST 0.005 % OP SOLN
1.0000 [drp] | Freq: Every day | OPHTHALMIC | Status: DC
  Filled 2023-11-21: qty 2.5

## 2023-11-21 MED ORDER — BUSPIRONE HCL 10 MG PO TABS
15.0000 mg | ORAL_TABLET | Freq: Two times a day (BID) | ORAL | Status: DC
  Administered 2023-11-21 – 2023-11-22 (×2): 15 mg via ORAL
  Filled 2023-11-21 (×2): qty 2
  Filled 2023-11-21: qty 1.5

## 2023-11-21 MED ORDER — PREDNISONE 20 MG PO TABS
40.0000 mg | ORAL_TABLET | Freq: Every day | ORAL | Status: DC
  Administered 2023-11-21: 40 mg via ORAL
  Filled 2023-11-21: qty 2

## 2023-11-21 MED ORDER — BUPROPION HCL ER (XL) 150 MG PO TB24
300.0000 mg | ORAL_TABLET | Freq: Every day | ORAL | Status: DC
  Administered 2023-11-21 – 2023-11-22 (×2): 300 mg via ORAL
  Filled 2023-11-21 (×2): qty 2

## 2023-11-21 MED ORDER — ALPRAZOLAM 0.5 MG PO TABS
1.0000 mg | ORAL_TABLET | Freq: Three times a day (TID) | ORAL | Status: DC | PRN
  Administered 2023-11-21: 1 mg via ORAL
  Filled 2023-11-21: qty 2

## 2023-11-21 MED ORDER — AMLODIPINE BESYLATE 5 MG PO TABS
5.0000 mg | ORAL_TABLET | Freq: Every day | ORAL | Status: DC
  Administered 2023-11-21 – 2023-11-22 (×2): 5 mg via ORAL
  Filled 2023-11-21 (×2): qty 1

## 2023-11-21 MED ORDER — MOMETASONE FURO-FORMOTEROL FUM 200-5 MCG/ACT IN AERO
2.0000 | INHALATION_SPRAY | Freq: Two times a day (BID) | RESPIRATORY_TRACT | Status: DC
  Administered 2023-11-22: 2 via RESPIRATORY_TRACT
  Filled 2023-11-21 (×2): qty 8.8

## 2023-11-21 MED ORDER — IPRATROPIUM-ALBUTEROL 0.5-2.5 (3) MG/3ML IN SOLN
3.0000 mL | Freq: Two times a day (BID) | RESPIRATORY_TRACT | Status: DC
  Administered 2023-11-21 – 2023-11-22 (×2): 3 mL via RESPIRATORY_TRACT
  Filled 2023-11-21 (×2): qty 3

## 2023-11-21 MED ORDER — NORTRIPTYLINE HCL 10 MG PO CAPS
10.0000 mg | ORAL_CAPSULE | Freq: Two times a day (BID) | ORAL | Status: DC
  Administered 2023-11-21 – 2023-11-22 (×2): 10 mg via ORAL
  Filled 2023-11-21 (×2): qty 1

## 2023-11-21 MED ORDER — GABAPENTIN 300 MG PO CAPS
600.0000 mg | ORAL_CAPSULE | Freq: Three times a day (TID) | ORAL | Status: DC
  Administered 2023-11-21 – 2023-11-22 (×2): 600 mg via ORAL
  Filled 2023-11-21 (×2): qty 2

## 2023-11-21 NOTE — Progress Notes (Addendum)
PROGRESS NOTE    Rachel DESKINS  ZOX:[id number] DOB: 1951/07/21 DOA: 11/20/2023 PCP: Alm Bustard, NP  Outpatient Specialists: neurology, pulmonology, cardiology, endocrinology    Brief Narrative:   From admission h and p  Rachel Nielsen is a 73 y.o. female with medical history significant for COPD, DM hypertension, CAD, anxiety, morbid obesity, OSA, and multiple strokes with residual right-sided weakness, dependent on a walker, who lives with her son, who presents to the ED for evaluation of weakness in the context of a fall the day prior.  States she fell while using her walker in the kitchen  With point of impact being her right shoulder and right side of her face.  She had no loss of consciousness and following the fall, her son was able to help her up.  The following day she awoke and was at her baseline however as the day progressed she felt increasing weakness in both her legs, and could not stand up without the assistance of her son, which is unusual.  She denied back pain of neck pain, denied bowel or bladder retention or incontinence and denied saddle anesthesia.  At the time of my evaluation she felt like she was getting back to her strength in her lower extremities.  She denies feeling unwell recently.  Denies any cough or congestion, fever or chills, nausea vomiting or diarrhea or abdominal pain or dysuria.  Assessment & Plan:   Principal Problem:   Fall at home, initial encounter Active Problems:   Generalized weakness   Physical deconditioning   AKI (acute kidney injury) (HCC)   Urinary tract infection   COPD with acute exacerbation (HCC)   Hypotension   Hypertension   History of CVA (cerebrovascular accident)   Anxiety and depression   OSA (obstructive sleep apnea)   Chronic constipation   Coronary artery disease   Obesity, Class III, BMI 40-49.9 (morbid obesity) (HCC)   Diabetes mellitus without complication (HCC)   Chronic anemia   # Frequent falls # Lower  extremity weakness # Peripheral neuropathy Baseline weakness and ambulatory dysfunction from prior strokes, peripheral neuropathy, age, etc. MRI here no acute stroke. Has established with neurology outpatient for this problem. CT of c-spine no significant canal stenosis. Home meds (gabapentin, xanax, depakote, bisoprolol) may contribute - will check MRI of lumbar and thoracic spine - will check neuropathy labs: tsh, b12, spep, crp, hiv (negative), hcv, hbv - PT/OT evals - home gabapentin  # CAD Asymptomatic - cont home asa, statin  # Overactive bladder - home myrbetriq  # Chronic constipation - home linzess  # T2DM Euglycemic - home farxiga - SSI  # Peripheral neuropathy - cont home gabapentin but at reduced dose - advise hold pregabalin given also taking gabapentin - need to query why taking depakote, will check valproic acid level  # Asthma - breo for home dulera  # GAD - home xanax, wellbutrin, buspar  # Hx CVA MRI nothing acute - home plavixm, aspirin, statin  # HTN Controlled - home amlodipine - hold bisoprolol    DVT prophylaxis: lovenox Code Status: full Family Communication: son Rachel Nielsen updated telephonically 1/19  Level of care: Med-Surg Status is: Observation     Consultants:  none  Procedures: none  Antimicrobials:  none    Subjective: Reports feeling well  Objective: Vitals:   11/21/23 0144 11/21/23 0222 11/21/23 0311 11/21/23 0741  BP: (!) 153/92  130/81 120/67  Pulse: 83  91 94  Resp: 16  16 16  Temp:  97.6 F (36.4 C) (!) 97.4 F (36.3 C) 98 F (36.7 C)  TempSrc:  Oral Oral Oral  SpO2: 97%  98% 94%  Weight:      Height:        Intake/Output Summary (Last 24 hours) at 11/21/2023 1210 Last data filed at 11/21/2023 0300 Gross per 24 hour  Intake --  Output 200 ml  Net -200 ml   Filed Weights   11/20/23 1547  Weight: 117.9 kg    Examination:  General exam: Appears calm and comfortable  Respiratory system:  Clear to auscultation. Respiratory effort normal. Cardiovascular system: S1 & S2 heard, RRR.  Gastrointestinal system: Abdomen is nondistended, soft and nontender.   Central nervous system: Alert and oriented. Slight lower extremity weakness Extremities: Symmetric 5 x 5 power. Skin: No rashes, lesions or ulcers Psychiatry: Judgement and insight appear normal. Mood & affect appropriate.     Data Reviewed: I have personally reviewed following labs and imaging studies  CBC: Recent Labs  Lab 11/20/23 1551 11/21/23 0410  WBC 9.5 8.6  HGB 10.9* 11.0*  HCT 38.3 37.6  MCV 78.5* 75.8*  PLT 209 183   Basic Metabolic Panel: Recent Labs  Lab 11/20/23 1551 11/21/23 0410  NA 140 137  K 3.6 3.6  CL 104 100  CO2 23 23  GLUCOSE 96 229*  BUN 21 21  CREATININE 1.07* 0.83  CALCIUM 8.7* 8.3*   GFR: Estimated Creatinine Clearance: 79.7 mL/min (by C-G formula based on SCr of 0.83 mg/dL). Liver Function Tests: No results for input(s): "AST", "ALT", "ALKPHOS", "BILITOT", "PROT", "ALBUMIN" in the last 168 hours. No results for input(s): "LIPASE", "AMYLASE" in the last 168 hours. No results for input(s): "AMMONIA" in the last 168 hours. Coagulation Profile: No results for input(s): "INR", "PROTIME" in the last 168 hours. Cardiac Enzymes: No results for input(s): "CKTOTAL", "CKMB", "CKMBINDEX", "TROPONINI" in the last 168 hours. BNP (last 3 results) No results for input(s): "PROBNP" in the last 8760 hours. HbA1C: No results for input(s): "HGBA1C" in the last 72 hours. CBG: Recent Labs  Lab 11/21/23 0000 11/21/23 0743  GLUCAP 148* 146*   Lipid Profile: No results for input(s): "CHOL", "HDL", "LDLCALC", "TRIG", "CHOLHDL", "LDLDIRECT" in the last 72 hours. Thyroid Function Tests: No results for input(s): "TSH", "T4TOTAL", "FREET4", "T3FREE", "THYROIDAB" in the last 72 hours. Anemia Panel: No results for input(s): "VITAMINB12", "FOLATE", "FERRITIN", "TIBC", "IRON", "RETICCTPCT" in the  last 72 hours. Urine analysis:    Component Value Date/Time   COLORURINE YELLOW (A) 11/20/2023 2038   APPEARANCEUR HAZY (A) 11/20/2023 2038   APPEARANCEUR Clear 01/26/2022 1104   LABSPEC 1.027 11/20/2023 2038   PHURINE 5.0 11/20/2023 2038   GLUCOSEU >=500 (A) 11/20/2023 2038   HGBUR NEGATIVE 11/20/2023 2038   BILIRUBINUR NEGATIVE 11/20/2023 2038   BILIRUBINUR Negative 01/26/2022 1104   KETONESUR 5 (A) 11/20/2023 2038   PROTEINUR 30 (A) 11/20/2023 2038   NITRITE NEGATIVE 11/20/2023 2038   LEUKOCYTESUR MODERATE (A) 11/20/2023 2038   Sepsis Labs: @LABRCNTIP (procalcitonin:4,lacticidven:4)  ) Recent Results (from the past 240 hours)  Resp panel by RT-PCR (RSV, Flu A&B, Covid) Anterior Nasal Swab     Status: None   Collection Time: 11/20/23  5:21 PM   Specimen: Anterior Nasal Swab  Result Value Ref Range Status   SARS Coronavirus 2 by RT PCR NEGATIVE NEGATIVE Final    Comment: (NOTE) SARS-CoV-2 target nucleic acids are NOT DETECTED.  The SARS-CoV-2 RNA is generally detectable in upper respiratory specimens during the  acute phase of infection. The lowest concentration of SARS-CoV-2 viral copies this assay can detect is 138 copies/mL. A negative result does not preclude SARS-Cov-2 infection and should not be used as the sole basis for treatment or other patient management decisions. A negative result may occur with  improper specimen collection/handling, submission of specimen other than nasopharyngeal swab, presence of viral mutation(s) within the areas targeted by this assay, and inadequate number of viral copies(<138 copies/mL). A negative result must be combined with clinical observations, patient history, and epidemiological information. The expected result is Negative.  Fact Sheet for Patients:  BloggerCourse.com  Fact Sheet for Healthcare Providers:  SeriousBroker.it  This test is no t yet approved or cleared by the  [place] FDA and  has been authorized for detection and/or diagnosis of SARS-CoV-2 by FDA under an Emergency Use Authorization (EUA). This EUA will remain  in effect (meaning this test can be used) for the duration of the COVID-19 declaration under Section 564(b)(1) of the Act, 21 U.S.C.section 360bbb-3(b)(1), unless the authorization is terminated  or revoked sooner.       Influenza A by PCR NEGATIVE NEGATIVE Final   Influenza B by PCR NEGATIVE NEGATIVE Final    Comment: (NOTE) The Xpert Xpress SARS-CoV-2/FLU/RSV plus assay is intended as an aid in the diagnosis of influenza from Nasopharyngeal swab specimens and should not be used as a sole basis for treatment. Nasal washings and aspirates are unacceptable for Xpert Xpress SARS-CoV-2/FLU/RSV testing.  Fact Sheet for Patients: BloggerCourse.com  Fact Sheet for Healthcare Providers: SeriousBroker.it  This test is not yet approved or cleared by the [place] FDA and has been authorized for detection and/or diagnosis of SARS-CoV-2 by FDA under an Emergency Use Authorization (EUA). This EUA will remain in effect (meaning this test can be used) for the duration of the COVID-19 declaration under Section 564(b)(1) of the Act, 21 U.S.C. section 360bbb-3(b)(1), unless the authorization is terminated or revoked.     Resp Syncytial Virus by PCR NEGATIVE NEGATIVE Final    Comment: (NOTE) Fact Sheet for Patients: BloggerCourse.com  Fact Sheet for Healthcare Providers: SeriousBroker.it  This test is not yet approved or cleared by the [place] FDA and has been authorized for detection and/or diagnosis of SARS-CoV-2 by FDA under an Emergency Use Authorization (EUA). This EUA will remain in effect (meaning this test can be used) for the duration of the COVID-19 declaration under Section 564(b)(1) of the Act, 21 U.S.C. section  360bbb-3(b)(1), unless the authorization is terminated or revoked.  Performed at [facility], [address], [place], [place] [address]          Radiology Studies: CT CERVICAL SPINE WO CONTRAST Result Date: 11/21/2023 CLINICAL DATA:  Fall EXAM: CT CERVICAL SPINE WITHOUT CONTRAST TECHNIQUE: Multidetector CT imaging of the cervical spine was performed without intravenous contrast. Multiplanar CT image reconstructions were also generated. RADIATION DOSE REDUCTION: This exam was performed according to the departmental dose-optimization program which includes automated exposure control, adjustment of the mA and/or kV according to patient size and/or use of iterative reconstruction technique. COMPARISON:  MRI 11/23/2022 FINDINGS: Alignment: No evidence of traumatic malalignment. Chronic loss of lordosis. Skull base and vertebrae: No acute fracture. No primary bone lesion or focal pathologic process. Soft tissues and spinal canal: No prevertebral fluid or swelling. No visible canal hematoma. Disc levels: Multilevel spondylosis, disc space height loss, degenerative endplate changes greatest at C5-C6 and C6-C7 where it is moderate. Multilevel facet arthropathy. No severe spinal canal narrowing.  Upper chest: No acute abnormality. Other: None. IMPRESSION: No acute fracture. Electronically Signed   By: Minerva Fester M.D.   On: 11/21/2023 01:36   MR BRAIN WO CONTRAST Result Date: 11/20/2023 CLINICAL DATA:  Left lower extremity weakness EXAM: MRI HEAD WITHOUT CONTRAST TECHNIQUE: Multiplanar, multiecho pulse sequences of the brain and surrounding structures were obtained without intravenous contrast. COMPARISON:  09/28/2023 FINDINGS: Brain: No acute infarct, mass effect or extra-axial collection. No acute or chronic hemorrhage. There is multifocal hyperintense T2-weighted signal within the white matter. Parenchymal volume and CSF spaces are normal. A partially empty sella turcica is incidentally  noted. Vascular: Normal flow voids. Skull and upper cervical spine: Normal calvarium and skull base. Visualized upper cervical spine and soft tissues are normal. Sinuses/Orbits:No paranasal sinus fluid levels or advanced mucosal thickening. No mastoid or middle ear effusion. Normal orbits. IMPRESSION: 1. No acute intracranial abnormality. 2. Findings of chronic small vessel ischemia. Electronically Signed   By: Deatra Robinson M.D.   On: 11/20/2023 22:04   CT HEAD WO CONTRAST ( ) Result Date: 11/20/2023 CLINICAL DATA:  Fall.  Follow-up head CT EXAM: CT HEAD WITHOUT CONTRAST TECHNIQUE: Contiguous axial images were obtained from the base of the skull through the vertex without intravenous contrast. RADIATION DOSE REDUCTION: This exam was performed according to the departmental dose-optimization program which includes automated exposure control, adjustment of the mA and/or kV according to patient size and/or use of iterative reconstruction technique. COMPARISON:  Same day CT performed 4 hours earlier FINDINGS: Brain: Unchanged appearance of slightly hyperattenuating extra-axial fluid over the left frontal convexity measuring up to 5 mm in thickness. No mass effect or midline shift. No evidence of intraparenchymal hemorrhage no intraventricular hemorrhage. No evidence of acute infarction. Mild-to-moderate cerebral volume loss. Vascular: No hyperdense vessel or unexpected calcification. Skull: Normal. Negative for fracture or focal lesion. Sinuses/Orbits: No acute finding. Other: Negative for scalp hematoma. IMPRESSION: Unchanged appearance of slightly hyperattenuating extra-axial fluid over the left frontal convexity measuring up to 5 mm in thickness. Appearance remains suspicious for a small chronic subdural hematoma. No mass effect or midline shift. Electronically Signed   By: Duanne Guess D.O.   On: 11/20/2023 20:42   DG Shoulder Right Result Date: 11/20/2023 CLINICAL DATA:  Fall, right shoulder pain EXAM:  RIGHT SHOULDER - 2+ VIEW COMPARISON:  None Available. FINDINGS: Degenerative changes in the right [facility] and glenohumeral joints with joint space narrowing and spurring. No acute bony abnormality. Specifically, no fracture, subluxation, or dislocation. IMPRESSION: No acute bony abnormality. Electronically Signed   By: Charlett Nose M.D.   On: 11/20/2023 17:05   CT Head Wo Contrast Result Date: 11/20/2023 CLINICAL DATA:  Larey Seat yesterday.  Bruising to the right eye. EXAM: CT HEAD WITHOUT CONTRAST TECHNIQUE: Contiguous axial images were obtained from the base of the skull through the vertex without intravenous contrast. RADIATION DOSE REDUCTION: This exam was performed according to the departmental dose-optimization program which includes automated exposure control, adjustment of the mA and/or kV according to patient size and/or use of iterative reconstruction technique. COMPARISON:  09/28/2023. FINDINGS: Brain: Widened extra-axial space over the lateral left frontal lobe. Within this area, the fluid has attenuation higher than CSF. This widening is also new from the prior exam. This suggests a small chronic subdural hemorrhage. No evidence of acute intracranial hemorrhage. No parenchymal masses. No midline shift. No evidence of infarction and no hydrocephalus. Vascular: No hyperdense vessel or unexpected calcification. Skull: Normal. Negative for fracture or focal lesion. Sinuses/Orbits: Globes and orbits  are unremarkable. Visualized sinuses are clear. Other: None. IMPRESSION: 1. Possible small left chronic subdural collection/hemorrhage. This is equivocal, but not evident the CT scan from 09/28/2023. This could be further assessed with brain MRI. 2. No other evidence an acute or recent. Electronically Signed   By: Amie Portland M.D.   On: 11/20/2023 16:25   DG Chest 2 View Result Date: 11/20/2023 CLINICAL DATA:  COPD.  Status post fall. EXAM: CHEST - 2 VIEW COMPARISON:  09/29/2023 FINDINGS: Normal scratch set stable  cardiomediastinal contours. Low lung volumes with chronic asymmetric elevation of the right hemidiaphragm. No pleural fluid, interstitial edema or airspace consolidation. The visualized osseous structures appear unremarkable. IMPRESSION: 1. No acute cardiopulmonary abnormalities. 2. Low lung volumes with chronic asymmetric elevation of the right hemidiaphragm. Electronically Signed   By: Signa Kell M.D.   On: 11/20/2023 16:10        Scheduled Meds:  aspirin EC  81 mg Oral Daily   brimonidine  1 drop Both Eyes TID   dorzolamide-timolol  1 drop Both Eyes BID   enoxaparin (LOVENOX) injection  0.5 mg/kg Subcutaneous Q24H   insulin aspart  0-15 Units Subcutaneous TID WC   insulin aspart  0-5 Units Subcutaneous QHS   ipratropium-albuterol  3 mL Nebulization BID   mirabegron ER  25 mg Oral Daily   pantoprazole  40 mg Oral BID   predniSONE  40 mg Oral Q breakfast   rosuvastatin  10 mg Oral Daily   Continuous Infusions:   LOS: 0 days     Silvano Bilis, MD [facility]   If 7PM-7AM, please contact night-coverage [email or web address] Password [email or web address] 11/21/2023, 12:10 PM

## 2023-11-21 NOTE — Care Management Obs Status (Signed)
MEDICARE OBSERVATION STATUS NOTIFICATION   Patient Details  Name: Rachel Nielsen MRN: [id number] Date of Birth: Aug 18, 1951   Medicare Observation Status Notification Given:  Yes    Tulsi Crossett E Jaiden Wahab, LCSW 11/21/2023, 3:10 PM

## 2023-11-21 NOTE — ED Notes (Signed)
Pt assisted to toilet with 2 assist. Pt able to stand and pivot with extensive assistance.

## 2023-11-21 NOTE — TOC Initial Note (Signed)
Transition of Care [facility]) - Initial/Assessment Note    Patient Details  Name: Rachel Nielsen MRN: [id number] Date of Birth: 1951/04/17  [facility]) CM/SW Contact:    Liliana Cline, LCSW Phone Number: 11/21/2023, 3:18 PM  Clinical Narrative:                 CSW spoke with patient at bedside. Patient lives with her son. Patient uses a RW at home. Patient states she is active with [facility] Health and prefers to resume home health services at discharge.  Expected Discharge Plan: Home w Home Health Services Barriers to Discharge: Continued Medical Work up   Patient Goals and CMS Choice Patient states their goals for this hospitalization and ongoing recovery are:: wants to resume home health CMS Medicare.gov Compare Post Acute Care list provided to:: Patient Choice offered to / list presented to : Patient      Expected Discharge Plan and Services       Living arrangements for the past 2 months: Single Family Home                                      Prior Living Arrangements/Services Living arrangements for the past 2 months: Single Family Home Lives with:: Adult Children Patient language and need for interpreter reviewed:: Yes Do you feel safe going back to the place where you live?: Yes      Need for Family Participation in Patient Care: Yes (Comment) Care giver support system in place?: Yes (comment) Current home services: DME, Home PT Criminal Activity/Legal Involvement Pertinent to Current Situation/Hospitalization: No - Comment as needed  Activities of Daily Living   ADL Screening (condition at time of admission) Independently performs ADLs?: Yes (appropriate for developmental age) Is the patient deaf or have difficulty hearing?: No Does the patient have difficulty seeing, even when wearing glasses/contacts?: No Does the patient have difficulty concentrating, remembering, or making decisions?: No  Permission Sought/Granted Permission  sought to share information with : Facility Industrial/product designer granted to share information with : Yes, Verbal Permission Granted     Permission granted to share info w AGENCY: [facility]        Emotional Assessment       Orientation: : Oriented to Self, Oriented to Place, Oriented to  Time, Oriented to Situation Alcohol / Substance Use: Not Applicable Psych Involvement: No (comment)  Admission diagnosis:  COPD exacerbation (HCC) [J44.1] Generalized weakness [R53.1] AKI (acute kidney injury) (HCC) [N17.9] Patient Active Problem List   Diagnosis Date Noted   AKI (acute kidney injury) (HCC) 11/20/2023   Urinary tract infection 11/20/2023   Coronary artery disease 11/20/2023   Physical deconditioning 11/20/2023   Hypotension 11/20/2023   Fall at home, initial encounter 11/20/2023   Obesity, Class III, BMI 40-49.9 (morbid obesity) (HCC) 11/20/2023   Diabetes mellitus without complication (HCC) 11/20/2023   Chronic anemia 11/20/2023   CAP (community acquired pneumonia) 09/28/2023   Generalized weakness 09/28/2023   Acute focal neurological deficit 09/28/2023   CKD (chronic kidney disease) stage 3, GFR 30-59 ml/min (HCC) 06/30/2022   OAB (overactive bladder) 06/30/2022   History of CVA (cerebrovascular accident) 05/12/2022   COPD with acute exacerbation (HCC) 09/15/2021   Mixed diabetic hyperlipidemia associated with type 2 diabetes mellitus (HCC) 07/09/2021   Chronic constipation 07/09/2021   Cervical radiculopathy 07/09/2021   Lumbar radiculopathy 07/09/2021   Class  2 obesity due to excess calories with body mass index (BMI) of 35.0 to 35.9 in adult 07/09/2021   OSA (obstructive sleep apnea) 12/18/2020   Diabetic neuropathy (HCC) 12/17/2020   Centrilobular emphysema (HCC) 12/17/2020   Anxiety and depression 12/17/2020   Uncontrolled type 2 diabetes mellitus with hyperglycemia (HCC) 03/14/2019   Hypertension 12/21/2017   GERD (gastroesophageal reflux disease)  12/21/2017   PCP:  Alm Bustard, NP Pharmacy:   [facility] - [place], [place] - [address] [place] [address] Phone: [phone] Fax: [phone]  [facility] - [place], [place] - [address] [place] [address] Phone: [phone] Fax: [phone]     Social Drivers of Health (SDOH) Social History: SDOH Screenings   Food Insecurity: No Food Insecurity (11/21/2023)  Housing: Low Risk  (11/21/2023)  Transportation Needs: No Transportation Needs (11/21/2023)  Utilities: Not At Risk (11/21/2023)  Alcohol Screen: Low Risk  (12/25/2022)  Depression (PHQ2-9): High Risk (02/12/2023)  Financial Resource Strain: Low Risk  (10/08/2023)   Received from [facility]  Physical Activity: Inactive (12/25/2022)  Social Connections: Socially Isolated (11/21/2023)  Stress: No Stress Concern Present (12/25/2022)  Tobacco Use: Medium Risk (11/20/2023)   SDOH Interventions:     Readmission Risk Interventions     No data to display

## 2023-11-21 NOTE — ED Notes (Signed)
Patient transported to CT 

## 2023-11-21 NOTE — Plan of Care (Signed)

## 2023-11-21 NOTE — ED Notes (Signed)
Pt given crackers, applesauce and gingerale per her request.

## 2023-11-21 NOTE — Progress Notes (Signed)
PT Cancellation Note  Patient Details Name: Rachel Nielsen MRN: [id number] DOB: Apr 13, 1951   Cancelled Treatment:    Reason Eval/Treat Not Completed: Other (comment).  PT consult received.  Chart reviewed.  Pt currently pending MRI of thoracic and lumbar spine.  Will hold therapy at this time and re-attempt PT evaluation at a later date/time once imaging is performed and pt appears medically appropriate for therapy participation.  Hendricks Limes, PT 11/21/23, 2:13 PM

## 2023-11-22 DIAGNOSIS — W19XXXA Unspecified fall, initial encounter: Secondary | ICD-10-CM | POA: Diagnosis not present

## 2023-11-22 DIAGNOSIS — R531 Weakness: Secondary | ICD-10-CM | POA: Diagnosis not present

## 2023-11-22 DIAGNOSIS — Y92009 Unspecified place in unspecified non-institutional (private) residence as the place of occurrence of the external cause: Secondary | ICD-10-CM | POA: Diagnosis not present

## 2023-11-22 DIAGNOSIS — I10 Essential (primary) hypertension: Secondary | ICD-10-CM | POA: Diagnosis not present

## 2023-11-22 LAB — GLUCOSE, CAPILLARY
Glucose-Capillary: 159 mg/dL — ABNORMAL HIGH (ref 70–99)
Glucose-Capillary: 137 mg/dL — ABNORMAL HIGH (ref 70–99)
Glucose-Capillary: 169 mg/dL — ABNORMAL HIGH (ref 70–99)

## 2023-11-22 LAB — URINE CULTURE

## 2023-11-22 LAB — T4, FREE: Free T4: 0.67 ng/dL (ref 0.61–1.12)

## 2023-11-22 LAB — ANA W/REFLEX IF POSITIVE: Anti Nuclear Antibody (ANA): NEGATIVE

## 2023-11-22 MED ORDER — LINACLOTIDE 145 MCG PO CAPS
290.0000 ug | ORAL_CAPSULE | Freq: Every day | ORAL | Status: DC
  Administered 2023-11-22: 290 ug via ORAL
  Filled 2023-11-22: qty 2

## 2023-11-22 MED ORDER — GABAPENTIN 600 MG PO TABS: 600.0000 mg | ORAL_TABLET | Freq: Three times a day (TID) | ORAL | Status: DC

## 2023-11-22 NOTE — Evaluation (Signed)
Physical Therapy Evaluation Patient Details Name: Rachel Nielsen MRN: [id number] DOB: 1951-02-03 Today's Date: 11/22/2023  History of Present Illness  Pt is a 73 y.o. female presenting to hospital 11/20/23 with c/o fall and weakness.  Pt admitted with fall, LE weakness, facial contusion, UTI, AKI, COPD with acute exacerbation.  PMH includes COPD, anxiety, htn, CAD, multiple previous strokes (residual R sided weakness), OSA.  Clinical Impression  Prior to recent medical concerns, pt was ambulatory (uses 4ww household distances; [facility] when going out to car; and uses w/c for appts) lives with her son in 1 level home with small threshold to enter; has aide that comes 2 days a week (for 3 hours each time) to assist with showers, dressing, meals, etc.  Pt A&Ox4; pleasant and participatory during session.  Currently pt is modified independent with bed mobility; SBA with transfers from bed; and CGA to SBA ambulating 200 feet with RW use.  No loss of balance noted during sessions activities.  Pt would currently benefit from skilled PT to address noted impairments and functional limitations (see below for any additional details).  Upon hospital discharge, pt would benefit from ongoing therapy (pt reports she has been receiving HHPT).     If plan is discharge home, recommend the following: A little help with walking and/or transfers;A little help with bathing/dressing/bathroom;Assistance with cooking/housework;Assist for transportation;Help with stairs or ramp for entrance   Can travel by private vehicle    Yes    Equipment Recommendations Other (comment) (pt has walker at home already)  Recommendations for Other Services       Functional Status Assessment Patient has had a recent decline in their functional status and demonstrates the ability to make significant improvements in function in a reasonable and predictable amount of time.     Precautions / Restrictions Precautions Precautions:  Fall Restrictions Weight Bearing Restrictions Per Provider Order: No      Mobility  Bed Mobility Overal bed mobility: Modified Independent             General bed mobility comments: Semi-supine to/from sitting with HOB elevated; no difficulties noted    Transfers Overall transfer level: Needs assistance Equipment used: Rolling walker (2 wheels) Transfers: Sit to/from Stand Sit to Stand: Supervision           General transfer comment: pt using B hands on walker to stand (pt reports that is how she typically stands); steady with RW use    Ambulation/Gait Ambulation/Gait assistance: Contact guard assist, Supervision Gait Distance (Feet): 200 Feet Assistive device: Rolling walker (2 wheels) Gait Pattern/deviations: Step-through pattern Gait velocity: decreased     General Gait Details: steady ambulation with RW use; vc's to stay closer to RW with fatigue/increased distance ambulating (pt reports her son often has to tell her the same thing)  Stairs            Wheelchair Mobility     Tilt Bed    Modified Rankin (Stroke Patients Only)       Balance Overall balance assessment: Needs assistance Sitting-balance support: No upper extremity supported, Feet supported Sitting balance-Leahy Scale: Normal Sitting balance - Comments: steady reaching outside BOS to donn/doff B shoes   Standing balance support: Bilateral upper extremity supported, During functional activity, Reliant on assistive device for balance Standing balance-Leahy Scale: Good Standing balance comment: steady ambulating with RW use  Pertinent Vitals/Pain Pain Assessment Pain Assessment: 0-10 Pain Score: 3  Pain Location: chronic "twinge" in B hips, back, and neck Pain Descriptors / Indicators:  ("Twinge") Pain Intervention(s): Limited activity within patient's tolerance, Monitored during session HR 75-105 bpm and SpO2 sats 96% or greater on room air  during sessions activities.    Home Living Family/patient expects to be discharged to:: Private residence Living Arrangements: Children (Pt's son) Available Help at Discharge: Family;Available 24 hours/day Type of Home: House Home Access: Stairs to enter Entrance Stairs-Rails: None Entrance Stairs-Number of Steps: Small threshold at back entrance   Home Layout: One level Home Equipment: Rollator (4 wheels);Cane - single point;Grab bars - tub/shower;Rolling Walker (2 wheels); shower bench      Prior Function Prior Level of Function : History of Falls (last six months);Needs assist             Mobility Comments: Uses SPC when going out to car (d/t walker not fitting through back door).  Uses 4ww for household mobility (uses w/c when goes to medical appts).  H/o falls in last 6 months.       Extremity/Trunk Assessment   Upper Extremity Assessment Upper Extremity Assessment: Defer to OT evaluation (R UE weakness noted (h/o stroke))    Lower Extremity Assessment Lower Extremity Assessment: RLE deficits/detail;LLE deficits/detail RLE Deficits / Details: hip flexion 3/5; knee flexion/extension 4+/5; DF 4+/5 LLE Deficits / Details: hip flexion, knee flexion/extension, and DF 5/5    Cervical / Trunk Assessment Cervical / Trunk Assessment: Normal  Communication   Communication Communication: No apparent difficulties Cueing Techniques: Verbal cues  Cognition Arousal: Alert Behavior During Therapy: WFL for tasks assessed/performed Overall Cognitive Status: Within Functional Limits for tasks assessed                                 General Comments: Oriented to person, hospital, date, and general situation.        General Comments General comments (skin integrity, edema, etc.): R eye bruising noted    Exercises     Assessment/Plan    PT Assessment Patient needs continued PT services  PT Problem List Decreased strength;Decreased activity  tolerance;Decreased balance;Decreased mobility;Pain       PT Treatment Interventions DME instruction;Gait training;Stair training;Functional mobility training;Therapeutic activities;Therapeutic exercise;Balance training;Patient/family education    PT Goals (Current goals can be found in the Care Plan section)  Acute Rehab PT Goals Patient Stated Goal: to improve strength and walking PT Goal Formulation: With patient Time For Goal Achievement: 12/06/23 Potential to Achieve Goals: Good    Frequency Min 1X/week     Co-evaluation               AM-PAC PT "6 Clicks" Mobility  Outcome Measure Help needed turning from your back to your side while in a flat bed without using bedrails?: None Help needed moving from lying on your back to sitting on the side of a flat bed without using bedrails?: None Help needed moving to and from a bed to a chair (including a wheelchair)?: A Little Help needed standing up from a chair using your arms (e.g., wheelchair or bedside chair)?: A Little Help needed to walk in hospital room?: A Little Help needed climbing 3-5 steps with a railing? : A Little 6 Click Score: 20    End of Session Equipment Utilized During Treatment: Gait belt Activity Tolerance: Patient tolerated treatment well Patient left: in bed;with call bell/phone  within reach;with bed alarm set Nurse Communication: Mobility status;Precautions (via white board) PT Visit Diagnosis: Other abnormalities of gait and mobility (R26.89);Muscle weakness (generalized) (M62.81);History of falling (Z91.81)    Time: 1610-9604 PT Time Calculation (min) (ACUTE ONLY): 22 min   Charges:   PT Evaluation $PT Eval Low Complexity: 1 Low PT Treatments $Therapeutic Activity: 8-22 mins PT General Charges $$ ACUTE PT VISIT: 1 Visit        Hendricks Limes, PT 11/22/23, 10:15 AM

## 2023-11-22 NOTE — Evaluation (Signed)
Occupational Therapy Evaluation Patient Details Name: Rachel Nielsen MRN: [id number] DOB: July 02, 1951 Today's Date: 11/22/2023   History of Present Illness Pt is a 73 y.o. female presenting to hospital 11/20/23 with c/o fall and weakness.  Pt admitted with fall, LE weakness, facial contusion, UTI, AKI, COPD with acute exacerbation.  PMH includes COPD, anxiety, htn, CAD, multiple previous strokes (residual R sided weakness), OSA.   Clinical Impression   PTA, pt completed mobility with 4WW for household distances and was mod independent for ADLs with an aide assisting with showers as needed. Pt very pleasant, participatory and A&Ox4. Completes functional mobility t/f bathroom with RW and CGA; stands at sink for grooming tasks and to don/doff gown. UB/LB bathing from sit to stands with supervision - CGA. Pt would benefit from skilled OT services to address noted impairments and functional limitations (see below for any additional details) in order to maximize safety and independence while minimizing falls risk and caregiver burden. Anticipate the need for follow up [facility] OT services upon acute hospital DC.        If plan is discharge home, recommend the following: A little help with walking and/or transfers;A little help with bathing/dressing/bathroom;Assistance with cooking/housework    Functional Status Assessment  Patient has had a recent decline in their functional status and demonstrates the ability to make significant improvements in function in a reasonable and predictable amount of time.  Equipment Recommendations  None recommended by OT       Precautions / Restrictions Precautions Precautions: Fall Restrictions Weight Bearing Restrictions Per Provider Order: No      Mobility Bed Mobility Overal bed mobility: Modified Independent                  Transfers Overall transfer level: Needs assistance Equipment used: Rolling walker (2 wheels) Transfers: Sit to/from Stand Sit to  Stand: Supervision                  Balance Overall balance assessment: Needs assistance Sitting-balance support: No upper extremity supported, Feet supported Sitting balance-Leahy Scale: Normal     Standing balance support: Bilateral upper extremity supported, During functional activity, Reliant on assistive device for balance Standing balance-Leahy Scale: Good Standing balance comment: stands at sink with no LOB                           ADL either performed or assessed with clinical judgement   ADL Overall ADL's : Needs assistance/impaired     Grooming: Wash/dry hands;Wash/dry face;Oral care;Standing;Sitting Grooming Details (indicate cue type and reason): standing at sink, sits to apply deodorant Upper Body Bathing: Sitting;Set up   Lower Body Bathing: Sit to/from stand;Set up   Upper Body Dressing : Supervision/safety;Standing Upper Body Dressing Details (indicate cue type and reason): dons gown standing at sink Lower Body Dressing: Contact guard assist;Sit to/from stand   Toilet Transfer: Contact guard assist;Ambulation;Rolling walker (2 wheels)   Toileting- Clothing Manipulation and Hygiene: Supervision/safety;Sit to/from stand       Functional mobility during ADLs: Rolling walker (2 wheels);Cueing for safety;Contact guard assist General ADL Comments: Pt performing ADL session standing at sink for oral care, setup provided with CGA for standing balance fading to supervision. UB/LB bathing from sit<>stand, supervision. Pt able to problem-sovle donning gown with OT leaving gown folded on sink. Mobility t/f bathroom with RW and CGA     Vision Baseline Vision/History: 1 Wears glasses Ability to See in Adequate Light: 0 Adequate  Patient Visual Report: No change from baseline              Pertinent Vitals/Pain Pain Assessment Pain Assessment: No/denies pain Pain Score: 0-No pain     Extremity/Trunk Assessment Upper Extremity Assessment Upper  Extremity Assessment: Overall WFL for tasks assessed;Right hand dominant (hx of CVA with residual deficits)   Lower Extremity Assessment Lower Extremity Assessment: Defer to PT evaluation RLE Deficits / Details: hip flexion 3/5; knee flexion/extension 4+/5; DF 4+/5 LLE Deficits / Details: hip flexion, knee flexion/extension, and DF 5/5   Cervical / Trunk Assessment Cervical / Trunk Assessment: Normal   Communication Communication Communication: No apparent difficulties Cueing Techniques: Verbal cues   Cognition Arousal: Alert Behavior During Therapy: WFL for tasks assessed/performed Overall Cognitive Status: Within Functional Limits for tasks assessed                                       General Comments  R eye bruising            Home Living Family/patient expects to be discharged to:: Private residence Living Arrangements: Children Available Help at Discharge: Family;Available 24 hours/day Type of Home: House Home Access: Stairs to enter [employer] of Steps: Small threshold at back entrance Entrance Stairs-Rails: None Home Layout: One level     Bathroom Shower/Tub: Chief Strategy Officer: Standard     Home Equipment: Rollator (4 wheels);Cane - single point;Grab bars - tub/shower;Rolling Walker (2 wheels);Tub bench   Additional Comments: Aide 2x/week (for 3 hours each time)      Prior Functioning/Environment Prior Level of Function : History of Falls (last six months);Needs assist             Mobility Comments: Uses SPC when going out to car (d/t walker not fitting through back door).  Uses 4ww for household mobility (uses w/c when goes to medical appts).  H/o falls in last 6 months. ADLs Comments: MOD I for ADL, but has noticed a decline in the last few weeks.        OT Problem List: Decreased strength;Impaired balance (sitting and/or standing);Decreased activity tolerance      OT Treatment/Interventions:  Self-care/ADL training;Neuromuscular education;Energy conservation;DME and/or AE instruction;Therapeutic activities;Balance training;Patient/family education    OT Goals(Current goals can be found in the care plan section) Acute Rehab OT Goals OT Goal Formulation: With patient Time For Goal Achievement: 12/06/23 Potential to Achieve Goals: Good  OT Frequency: Min 1X/week    Co-evaluation              AM-PAC OT "6 Clicks" Daily Activity     Outcome Measure Help from another person eating meals?: None Help from another person taking care of personal grooming?: A Little Help from another person toileting, which includes using toliet, bedpan, or urinal?: A Little Help from another person bathing (including washing, rinsing, drying)?: A Little Help from another person to put on and taking off regular upper body clothing?: None Help from another person to put on and taking off regular lower body clothing?: A Little 6 Click Score: 20   End of Session Equipment Utilized During Treatment: Rolling walker (2 wheels);Gait belt Nurse Communication: Mobility status  Activity Tolerance: Patient tolerated treatment well Patient left: in bed;with call bell/phone within reach;with bed alarm set  OT Visit Diagnosis: Unsteadiness on feet (R26.81);Repeated falls (R29.6);Other abnormalities of gait and mobility (R26.89)  Time: 2952-8413 OT Time Calculation (min): 22 min Charges:  OT General Charges $OT Visit: 1 Visit OT Evaluation $OT Eval Low Complexity: 1 Low OT Treatments $Self Care/Home Management : 8-22 mins  Janean Eischen L. Lashanta Elbe, OTR/L  11/22/23, 12:27 PM

## 2023-11-22 NOTE — Progress Notes (Signed)
Discharge instructions given to and reviewed with patient. Patient verbalized understanding of all discharge instructions including follow up care and changes to how she takes gabapentin. Patient left unit alert with no distress noted with volunteer. Patient's son taking patient home. Eye drops and inhaler sent home with patient.

## 2023-11-22 NOTE — TOC Transition Note (Signed)
Transition of Care [facility]) - Discharge Note   Patient Details  Name: Rachel Nielsen MRN: [id number] Date of Birth: 1951/01/17  Transition of Care [facility]) CM/SW Contact:  Garret Reddish, RN Phone Number: 11/22/2023, 12:28 PM   Clinical Narrative:    Chart reviewed.  Noted that patient has orders for discharge today.    I have spoken with Adelina Mings with [facility].  Adelina Mings confirms that patient is active it [facility] for home health PT and OT.    I have informed Adelina Mings that patient would be a discharge for today and would need to resume home health services for PT and OT.     Final next level of care: Home w Home Health Services Barriers to Discharge: No Barriers Identified   Patient Goals and CMS Choice Patient states their goals for this hospitalization and ongoing recovery are:: wants to resume home health CMS Medicare.gov Compare Post Acute Care list provided to:: Patient Choice offered to / list presented to : Patient      Discharge Placement                    Patient and family notified of of transfer: 11/22/23  Discharge Plan and Services Additional resources added to the After Visit Summary for                            [facility] Arranged: OT, PT [facility] Agency: [facility] Date [facility] Agency Contacted: 11/22/23   Representative spoke with at [facility] Agency: Adelina Mings  Social Drivers of Health (SDOH) Interventions SDOH Screenings   Food Insecurity: No Food Insecurity (11/21/2023)  Housing: Low Risk  (11/21/2023)  Transportation Needs: No Transportation Needs (11/21/2023)  Utilities: Not At Risk (11/21/2023)  Alcohol Screen: Low Risk  (12/25/2022)  Depression (PHQ2-9): High Risk (02/12/2023)  Financial Resource Strain: Low Risk  (10/08/2023)   Received from [facility]  Physical Activity: Inactive (12/25/2022)  Social Connections: Socially Isolated (11/21/2023)  Stress: No Stress Concern Present (12/25/2022)  Tobacco Use: Medium Risk (11/20/2023)      Readmission Risk Interventions     No data to display

## 2023-11-22 NOTE — Discharge Summary (Signed)
Rachel Nielsen:[id number] DOB: 1951/09/10 DOA: 11/20/2023  PCP: Alm Bustard, NP  Admit date: 11/20/2023 Discharge date: 11/22/2023  Time spent: 35 minutes  Recommendations for Outpatient Follow-up:  Pcp and neurology and psychiatry f/u F/u hcv and free t4, both pending at time of discharge Work to wean off as many meds as possible that can contribute to propensity to fall    Discharge Diagnoses:  Principal Problem:   Fall at home, initial encounter Active Problems:   Generalized weakness   Physical deconditioning   AKI (acute kidney injury) (HCC)   Urinary tract infection   COPD with acute exacerbation (HCC)   Hypotension   Hypertension   History of CVA (cerebrovascular accident)   Anxiety and depression   OSA (obstructive sleep apnea)   Chronic constipation   Coronary artery disease   Obesity, Class III, BMI 40-49.9 (morbid obesity) (HCC)   Diabetes mellitus without complication (HCC)   Chronic anemia   Discharge Condition: stable  Diet recommendation: heart healthy  Filed Weights   11/20/23 1547  Weight: 117.9 kg    History of present illness:  From admission h and p Rachel Nielsen is a 73 y.o. female with medical history significant for COPD, DM hypertension, CAD, anxiety, morbid obesity, OSA, and multiple strokes with residual right-sided weakness, dependent on a walker, who lives with her son, who presents to the ED for evaluation of weakness in the context of a fall the day prior.  States she fell while using her walker in the kitchen  With point of impact being her right shoulder and right side of her face.  She had no loss of consciousness and following the fall, her son was able to help her up.  The following day she awoke and was at her baseline however as the day progressed she felt increasing weakness in both her legs, and could not stand up without the assistance of her son, which is unusual.  She denied back pain of neck pain, denied bowel or bladder  retention or incontinence and denied saddle anesthesia.  At the time of my evaluation she felt like she was getting back to her strength in her lower extremities.  She denies feeling unwell recently.  Denies any cough or congestion, fever or chills, nausea vomiting or diarrhea or abdominal pain or dysuria.  Hospital Course:  Patient presents after a fall at home, has had a number of recent falls. No significant injury. MRI obtained which did not show stroke, also had imaging of spine that did not show signs of cord compression. Evaluated by PT and deemed stable for discharge with home health. Is on a number of meds that can contribute to unsteadiness (alprazolam, bisoprolol, depakote, gabapentin, lyrica, asenapine, nortryptyline) and so advise working with prescribing physicians to wean off or substitute for others as many of those meds as possible.  Procedures: none   Consultations: none  Discharge Exam: Vitals:   11/22/23 0510 11/22/23 0727  BP: 111/76 114/73  Pulse: 83 88  Resp: 16 18  Temp: 98.6 F (37 C) 98 F (36.7 C)  SpO2: 98% 100%    General: NAD Cardiovascular: RRR Respiratory: CTAB  Discharge Instructions   Discharge Instructions     Diet - low sodium heart healthy   Complete by: As directed    Increase activity slowly   Complete by: As directed       Allergies as of 11/22/2023       Reactions   Ace Inhibitors Cough  Medication List     TAKE these medications    Accu-Chek Guide test strip Generic drug: glucose blood Use to check blood sugar up to twice a day as directed   Accu-Chek Softclix Lancets lancets Use to check blood sugar up to twice a day as directed   albuterol 108 (90 Base) MCG/ACT inhaler Commonly known as: VENTOLIN HFA Inhale 2-4 puffs by mouth every 4 hours as needed for wheezing, cough, and/or shortness of breath What changed:  how much to take when to take this reasons to take this additional instructions   albuterol  (2.5 MG/3ML) 0.083% nebulizer solution Commonly known as: PROVENTIL Inhale 3 mLs (2.5 mg total) into the lungs every 4 (four) hours as needed for wheezing or shortness of breath. What changed: when to take this   ALPRAZolam 1 MG tablet Commonly known as: XANAX Take 1 mg by mouth 3 (three) times daily as needed for anxiety or sleep.   amLODipine 5 MG tablet Commonly known as: NORVASC Take 1 tablet (5 mg total) by mouth daily.   Asenapine Maleate 10 MG Subl Place 10 mg under the tongue at bedtime.   aspirin EC 81 MG tablet Take 1 tablet (81 mg total) by mouth daily. Swallow whole.   bisoprolol 10 MG tablet Commonly known as: ZEBETA Take 1 tablet (10 mg total) by mouth daily. OFFICE VISIT NEEDED FOR ADDITIONAL REFILLS   brimonidine 0.2 % ophthalmic solution Commonly known as: ALPHAGAN 1 drop 3 (three) times daily.   buPROPion 300 MG 24 hr tablet Commonly known as: WELLBUTRIN XL Take 1 tablet (300 mg total) by mouth daily.   busPIRone 15 MG tablet Commonly known as: BUSPAR Take 15 mg by mouth 2 (two) times daily.   clopidogrel 75 MG tablet Commonly known as: PLAVIX Take 75 mg by mouth daily.   divalproex 250 MG DR tablet Commonly known as: DEPAKOTE Take 250 mg by mouth 2 (two) times daily.   docusate sodium 100 MG capsule Commonly known as: COLACE Take 100 mg by mouth daily.   dorzolamide-timolol 2-0.5 % ophthalmic solution Commonly known as: COSOPT Place 1 drop into both eyes 2 (two) times daily.   Dulaglutide 3 MG/0.5ML Soaj Inject 0.5 mLs (3 mg total) subcutaneously once a week   famotidine 20 MG tablet Commonly known as: PEPCID Take 20 mg by mouth 2 (two) times daily.   Farxiga 10 MG Tabs tablet Generic drug: dapagliflozin propanediol Take 10 mg by mouth daily.   fluticasone 50 MCG/ACT nasal spray Commonly known as: FLONASE Place 1 spray into both nostrils daily.   fluticasone-salmeterol 250-50 MCG/ACT Aepb Commonly known as: ADVAIR Inhale 1 puff  into the lungs 2 (two) times daily.   gabapentin 600 MG tablet Commonly known as: NEURONTIN Take 1 tablet (600 mg total) by mouth 3 (three) times daily. 600 mg AM, 600 mg pm and 1200 mg at bedtime What changed: how much to take   Global Ease Inject Pen Needles 32G X 4 MM Misc Generic drug: Insulin Pen Needle Use as directed with insulin   BD Pen Needle Nano U/F 32G X 4 MM Misc Generic drug: Insulin Pen Needle Inject 1 Device into the skin daily.   INSULIN SYRINGE 1CC/31GX5/16" 31G X 5/16" 1 ML Misc 1 Syringe by Does not apply route in the morning, at noon, in the evening, and at bedtime.   Lantus SoloStar 100 UNIT/ML Solostar Pen Generic drug: insulin glargine Inject 22 Units into the skin at bedtime.   Linzess 290  MCG Caps capsule Generic drug: linaclotide Take 1 capsule (290 mcg total) by mouth daily before breakfast.   metFORMIN 1000 MG tablet Commonly known as: GLUCOPHAGE Take 1,000 mg by mouth 2 (two) times daily with a meal.   montelukast 10 MG tablet Commonly known as: SINGULAIR Take 1 tablet (10 mg total) by mouth daily.   Myrbetriq 25 MG Tb24 tablet Generic drug: mirabegron ER Take 25 mg by mouth daily.   nortriptyline 10 MG capsule Commonly known as: PAMELOR Take 10 mg by mouth 2 (two) times daily.   pantoprazole 40 MG tablet Commonly known as: PROTONIX Take 1 tablet (40 mg total) by mouth 2 (two) times daily.   pregabalin 75 MG capsule Commonly known as: LYRICA Take 75 mg by mouth 3 (three) times daily.   rosuvastatin 10 MG tablet Commonly known as: CRESTOR Take 1 tablet (10 mg total) by mouth daily.   Travatan Z 0.004 % Soln ophthalmic solution Generic drug: Travoprost (BAK Free) Place 1 drop into both eyes at bedtime.       Allergies  Allergen Reactions   Ace Inhibitors Cough    Follow-up Information     Fields, Glenda L, NP Follow up.   Specialty: Family Medicine Contact information: [address] [place]  [address] [phone]         Morene Crocker, MD Follow up.   Specialty: Neurology Why: as scheduled Contact information: [address] [facility] [place] [address] [phone]                  The results of significant diagnostics from this hospitalization (including imaging, microbiology, ancillary and laboratory) are listed below for reference.    Significant Diagnostic Studies: MR LUMBAR SPINE WO CONTRAST Result Date: 11/21/2023 CLINICAL DATA:  Lower extremity weakness EXAM: MRI THORACIC AND LUMBAR SPINE WITHOUT CONTRAST TECHNIQUE: Multiplanar and multiecho pulse sequences of the thoracic and lumbar spine were obtained without intravenous contrast. COMPARISON:  None Available. FINDINGS: MRI THORACIC SPINE FINDINGS Alignment:  Physiologic. Vertebrae: No fracture, evidence of discitis, or bone lesion. Cord:  Normal signal and morphology. Paraspinal and other soft tissues: Negative. Disc levels: Small left subarticular disc protrusion at T7-8 without spinal canal or neural foraminal stenosis. The other disc levels are unremarkable. MRI LUMBAR SPINE FINDINGS Segmentation:  Standard. Alignment:  Grade 1 anterolisthesis at L4-5 Vertebrae:  No fracture, evidence of discitis, or bone lesion. Conus medullaris and cauda equina: Conus extends to the L1 level. Conus and cauda equina appear normal. Paraspinal and other soft tissues: Negative. Disc levels: L1-L2: Normal disc space and facet joints. No spinal canal stenosis. No neural foraminal stenosis. L2-L3: Normal disc space and facet joints. No spinal canal stenosis. No neural foraminal stenosis. L3-L4: Small disc bulge and moderate facet arthrosis. No spinal canal stenosis. No neural foraminal stenosis. L4-L5: Moderate facet arthrosis and small disc bulge. No spinal canal stenosis. No neural foraminal stenosis. L5-S1: Small disc bulge with moderate left facet arthrosis. No spinal canal stenosis. Mild left  neural foraminal stenosis. Visualized sacrum: Normal. IMPRESSION: 1. No acute abnormality of the thoracic or lumbar spine. 2. Mild left L5-S1 neural foraminal stenosis due to combination of disc bulge and facet arthrosis. 3. Moderate lower lumbar facet arthrosis. Electronically Signed   By: Deatra Robinson M.D.   On: 11/21/2023 19:11   MR THORACIC SPINE WO CONTRAST Result Date: 11/21/2023 CLINICAL DATA:  Lower extremity weakness EXAM: MRI THORACIC AND LUMBAR SPINE WITHOUT CONTRAST TECHNIQUE: Multiplanar and multiecho  pulse sequences of the thoracic and lumbar spine were obtained without intravenous contrast. COMPARISON:  None Available. FINDINGS: MRI THORACIC SPINE FINDINGS Alignment:  Physiologic. Vertebrae: No fracture, evidence of discitis, or bone lesion. Cord:  Normal signal and morphology. Paraspinal and other soft tissues: Negative. Disc levels: Small left subarticular disc protrusion at T7-8 without spinal canal or neural foraminal stenosis. The other disc levels are unremarkable. MRI LUMBAR SPINE FINDINGS Segmentation:  Standard. Alignment:  Grade 1 anterolisthesis at L4-5 Vertebrae:  No fracture, evidence of discitis, or bone lesion. Conus medullaris and cauda equina: Conus extends to the L1 level. Conus and cauda equina appear normal. Paraspinal and other soft tissues: Negative. Disc levels: L1-L2: Normal disc space and facet joints. No spinal canal stenosis. No neural foraminal stenosis. L2-L3: Normal disc space and facet joints. No spinal canal stenosis. No neural foraminal stenosis. L3-L4: Small disc bulge and moderate facet arthrosis. No spinal canal stenosis. No neural foraminal stenosis. L4-L5: Moderate facet arthrosis and small disc bulge. No spinal canal stenosis. No neural foraminal stenosis. L5-S1: Small disc bulge with moderate left facet arthrosis. No spinal canal stenosis. Mild left neural foraminal stenosis. Visualized sacrum: Normal. IMPRESSION: 1. No acute abnormality of the thoracic or  lumbar spine. 2. Mild left L5-S1 neural foraminal stenosis due to combination of disc bulge and facet arthrosis. 3. Moderate lower lumbar facet arthrosis. Electronically Signed   By: Deatra Robinson M.D.   On: 11/21/2023 19:11   CT CERVICAL SPINE WO CONTRAST Result Date: 11/21/2023 CLINICAL DATA:  Fall EXAM: CT CERVICAL SPINE WITHOUT CONTRAST TECHNIQUE: Multidetector CT imaging of the cervical spine was performed without intravenous contrast. Multiplanar CT image reconstructions were also generated. RADIATION DOSE REDUCTION: This exam was performed according to the departmental dose-optimization program which includes automated exposure control, adjustment of the mA and/or kV according to patient size and/or use of iterative reconstruction technique. COMPARISON:  MRI 11/23/2022 FINDINGS: Alignment: No evidence of traumatic malalignment. Chronic loss of lordosis. Skull base and vertebrae: No acute fracture. No primary bone lesion or focal pathologic process. Soft tissues and spinal canal: No prevertebral fluid or swelling. No visible canal hematoma. Disc levels: Multilevel spondylosis, disc space height loss, degenerative endplate changes greatest at C5-C6 and C6-C7 where it is moderate. Multilevel facet arthropathy. No severe spinal canal narrowing. Upper chest: No acute abnormality. Other: None. IMPRESSION: No acute fracture. Electronically Signed   By: Minerva Fester M.D.   On: 11/21/2023 01:36   MR BRAIN WO CONTRAST Result Date: 11/20/2023 CLINICAL DATA:  Left lower extremity weakness EXAM: MRI HEAD WITHOUT CONTRAST TECHNIQUE: Multiplanar, multiecho pulse sequences of the brain and surrounding structures were obtained without intravenous contrast. COMPARISON:  09/28/2023 FINDINGS: Brain: No acute infarct, mass effect or extra-axial collection. No acute or chronic hemorrhage. There is multifocal hyperintense T2-weighted signal within the white matter. Parenchymal volume and CSF spaces are normal. A partially  empty sella turcica is incidentally noted. Vascular: Normal flow voids. Skull and upper cervical spine: Normal calvarium and skull base. Visualized upper cervical spine and soft tissues are normal. Sinuses/Orbits:No paranasal sinus fluid levels or advanced mucosal thickening. No mastoid or middle ear effusion. Normal orbits. IMPRESSION: 1. No acute intracranial abnormality. 2. Findings of chronic small vessel ischemia. Electronically Signed   By: Deatra Robinson M.D.   On: 11/20/2023 22:04   CT HEAD WO CONTRAST ( ) Result Date: 11/20/2023 CLINICAL DATA:  Fall.  Follow-up head CT EXAM: CT HEAD WITHOUT CONTRAST TECHNIQUE: Contiguous axial images were obtained from the base of  the skull through the vertex without intravenous contrast. RADIATION DOSE REDUCTION: This exam was performed according to the departmental dose-optimization program which includes automated exposure control, adjustment of the mA and/or kV according to patient size and/or use of iterative reconstruction technique. COMPARISON:  Same day CT performed 4 hours earlier FINDINGS: Brain: Unchanged appearance of slightly hyperattenuating extra-axial fluid over the left frontal convexity measuring up to 5 mm in thickness. No mass effect or midline shift. No evidence of intraparenchymal hemorrhage no intraventricular hemorrhage. No evidence of acute infarction. Mild-to-moderate cerebral volume loss. Vascular: No hyperdense vessel or unexpected calcification. Skull: Normal. Negative for fracture or focal lesion. Sinuses/Orbits: No acute finding. Other: Negative for scalp hematoma. IMPRESSION: Unchanged appearance of slightly hyperattenuating extra-axial fluid over the left frontal convexity measuring up to 5 mm in thickness. Appearance remains suspicious for a small chronic subdural hematoma. No mass effect or midline shift. Electronically Signed   By: Duanne Guess D.O.   On: 11/20/2023 20:42   DG Shoulder Right Result Date: 11/20/2023 CLINICAL  DATA:  Fall, right shoulder pain EXAM: RIGHT SHOULDER - 2+ VIEW COMPARISON:  None Available. FINDINGS: Degenerative changes in the right [facility] and glenohumeral joints with joint space narrowing and spurring. No acute bony abnormality. Specifically, no fracture, subluxation, or dislocation. IMPRESSION: No acute bony abnormality. Electronically Signed   By: Charlett Nose M.D.   On: 11/20/2023 17:05   CT Head Wo Contrast Result Date: 11/20/2023 CLINICAL DATA:  Larey Seat yesterday.  Bruising to the right eye. EXAM: CT HEAD WITHOUT CONTRAST TECHNIQUE: Contiguous axial images were obtained from the base of the skull through the vertex without intravenous contrast. RADIATION DOSE REDUCTION: This exam was performed according to the departmental dose-optimization program which includes automated exposure control, adjustment of the mA and/or kV according to patient size and/or use of iterative reconstruction technique. COMPARISON:  09/28/2023. FINDINGS: Brain: Widened extra-axial space over the lateral left frontal lobe. Within this area, the fluid has attenuation higher than CSF. This widening is also new from the prior exam. This suggests a small chronic subdural hemorrhage. No evidence of acute intracranial hemorrhage. No parenchymal masses. No midline shift. No evidence of infarction and no hydrocephalus. Vascular: No hyperdense vessel or unexpected calcification. Skull: Normal. Negative for fracture or focal lesion. Sinuses/Orbits: Globes and orbits are unremarkable. Visualized sinuses are clear. Other: None. IMPRESSION: 1. Possible small left chronic subdural collection/hemorrhage. This is equivocal, but not evident the CT scan from 09/28/2023. This could be further assessed with brain MRI. 2. No other evidence an acute or recent. Electronically Signed   By: Amie Portland M.D.   On: 11/20/2023 16:25   DG Chest 2 View Result Date: 11/20/2023 CLINICAL DATA:  COPD.  Status post fall. EXAM: CHEST - 2 VIEW COMPARISON:   09/29/2023 FINDINGS: Normal scratch set stable cardiomediastinal contours. Low lung volumes with chronic asymmetric elevation of the right hemidiaphragm. No pleural fluid, interstitial edema or airspace consolidation. The visualized osseous structures appear unremarkable. IMPRESSION: 1. No acute cardiopulmonary abnormalities. 2. Low lung volumes with chronic asymmetric elevation of the right hemidiaphragm. Electronically Signed   By: Signa Kell M.D.   On: 11/20/2023 16:10    Microbiology: Recent Results (from the past 240 hours)  Resp panel by RT-PCR (RSV, Flu A&B, Covid) Anterior Nasal Swab     Status: None   Collection Time: 11/20/23  5:21 PM   Specimen: Anterior Nasal Swab  Result Value Ref Range Status   SARS Coronavirus 2 by RT PCR NEGATIVE NEGATIVE  Final    Comment: (NOTE) SARS-CoV-2 target nucleic acids are NOT DETECTED.  The SARS-CoV-2 RNA is generally detectable in upper respiratory specimens during the acute phase of infection. The lowest concentration of SARS-CoV-2 viral copies this assay can detect is 138 copies/mL. A negative result does not preclude SARS-Cov-2 infection and should not be used as the sole basis for treatment or other patient management decisions. A negative result may occur with  improper specimen collection/handling, submission of specimen other than nasopharyngeal swab, presence of viral mutation(s) within the areas targeted by this assay, and inadequate number of viral copies(<138 copies/mL). A negative result must be combined with clinical observations, patient history, and epidemiological information. The expected result is Negative.  Fact Sheet for Patients:  BloggerCourse.com  Fact Sheet for Healthcare Providers:  SeriousBroker.it  This test is no t yet approved or cleared by the [place] FDA and  has been authorized for detection and/or diagnosis of SARS-CoV-2 by FDA under an Emergency  Use Authorization (EUA). This EUA will remain  in effect (meaning this test can be used) for the duration of the COVID-19 declaration under Section 564(b)(1) of the Act, 21 U.S.C.section 360bbb-3(b)(1), unless the authorization is terminated  or revoked sooner.       Influenza A by PCR NEGATIVE NEGATIVE Final   Influenza B by PCR NEGATIVE NEGATIVE Final    Comment: (NOTE) The Xpert Xpress SARS-CoV-2/FLU/RSV plus assay is intended as an aid in the diagnosis of influenza from Nasopharyngeal swab specimens and should not be used as a sole basis for treatment. Nasal washings and aspirates are unacceptable for Xpert Xpress SARS-CoV-2/FLU/RSV testing.  Fact Sheet for Patients: BloggerCourse.com  Fact Sheet for Healthcare Providers: SeriousBroker.it  This test is not yet approved or cleared by the [place] FDA and has been authorized for detection and/or diagnosis of SARS-CoV-2 by FDA under an Emergency Use Authorization (EUA). This EUA will remain in effect (meaning this test can be used) for the duration of the COVID-19 declaration under Section 564(b)(1) of the Act, 21 U.S.C. section 360bbb-3(b)(1), unless the authorization is terminated or revoked.     Resp Syncytial Virus by PCR NEGATIVE NEGATIVE Final    Comment: (NOTE) Fact Sheet for Patients: BloggerCourse.com  Fact Sheet for Healthcare Providers: SeriousBroker.it  This test is not yet approved or cleared by the [place] FDA and has been authorized for detection and/or diagnosis of SARS-CoV-2 by FDA under an Emergency Use Authorization (EUA). This EUA will remain in effect (meaning this test can be used) for the duration of the COVID-19 declaration under Section 564(b)(1) of the Act, 21 U.S.C. section 360bbb-3(b)(1), unless the authorization is terminated or revoked.  Performed at [facility], [address], [place], [place] [address]   Urine Culture     Status: Abnormal   Collection Time: 11/20/23  8:38 PM   Specimen: Urine, Clean Catch  Result Value Ref Range Status   Specimen Description   Final    URINE, CLEAN CATCH Performed at [facility], [address] [place], [place] [address]    Special Requests   Final    NONE Performed at [facility], [address] [place], [place] [address]    Culture MULTIPLE SPECIES PRESENT, SUGGEST RECOLLECTION (A)  Final   Report Status 11/22/2023 FINAL  Final     Labs: Basic Metabolic Panel: Recent Labs  Lab 11/20/23 1551 11/21/23 0410  NA 140 137  K 3.6 3.6  CL 104 100  CO2  23 23  GLUCOSE 96 229*  BUN 21 21  CREATININE 1.07* 0.83  CALCIUM 8.7* 8.3*   Liver Function Tests: No results for input(s): "AST", "ALT", "ALKPHOS", "BILITOT", "PROT", "ALBUMIN" in the last 168 hours. No results for input(s): "LIPASE", "AMYLASE" in the last 168 hours. No results for input(s): "AMMONIA" in the last 168 hours. CBC: Recent Labs  Lab 11/20/23 1551 11/21/23 0410  WBC 9.5 8.6  HGB 10.9* 11.0*  HCT 38.3 37.6  MCV 78.5* 75.8*  PLT 209 183   Cardiac Enzymes: No results for input(s): "CKTOTAL", "CKMB", "CKMBINDEX", "TROPONINI" in the last 168 hours. BNP: BNP (last 3 results) No results for input(s): "BNP" in the last 8760 hours.  ProBNP (last 3 results) No results for input(s): "PROBNP" in the last 8760 hours.  CBG: Recent Labs  Lab 11/21/23 1631 11/21/23 2112 11/22/23 0512 11/22/23 0728 11/22/23 1122  GLUCAP 200* 190* 159* 137* 169*       Signed:  Silvano Bilis MD.  [facility] 11/22/2023, 12:24 PM

## 2023-11-23 LAB — HCV INTERPRETATION

## 2023-11-23 LAB — HCV AB W REFLEX TO QUANT PCR: HCV Ab: NONREACTIVE

## 2023-11-25 LAB — PROTEIN ELECTROPHORESIS, SERUM
A/G Ratio: 1 (ref 0.7–1.7)
Albumin ELP: 2.9 g/dL (ref 2.9–4.4)
Alpha-1-Globulin: 0.2 g/dL (ref 0.0–0.4)
Alpha-2-Globulin: 0.7 g/dL (ref 0.4–1.0)
Beta Globulin: 1 g/dL (ref 0.7–1.3)
Gamma Globulin: 1 g/dL (ref 0.4–1.8)
Globulin, Total: 3 g/dL (ref 2.2–3.9)
Total Protein ELP: 5.9 g/dL — ABNORMAL LOW (ref 6.0–8.5)

## 2023-12-12 ENCOUNTER — Other Ambulatory Visit: Payer: Self-pay | Admitting: Internal Medicine

## 2023-12-13 NOTE — Telephone Encounter (Signed)
 Requested Prescriptions  Refused Prescriptions Disp Refills   pantoprazole  (PROTONIX ) 40 MG tablet [Pharmacy Med Name: PANTOPRAZOLE  SOD DR 40 MG TAB] 180 tablet 0    Sig: Take 1 tablet (40 mg total) by mouth 2 (two) times daily.     Gastroenterology: Proton Pump Inhibitors Passed - 12/13/2023  4:57 PM      Passed - Valid encounter within last 12 months    Recent Outpatient Visits           10 months ago Encounter for general adult medical examination with abnormal findings   [place] [facility] [place], [place], NP   1 year ago Uncontrolled type 2 diabetes mellitus with hyperglycemia [facility])   [place] [facility] [place], [place] Buzzard, NP   1 year ago Encounter for general adult medical examination with abnormal findings   [place] [facility] [place], [place]in Buzzard, NP   1 year ago COPD exacerbation [facility])   [place] [facility] [place], [place], NP   2 years ago Mixed diabetic hyperlipidemia associated with type 2 diabetes mellitus [facility])   [place] [facility] [place], [place]in Buzzard, [place]

## 2023-12-26 ENCOUNTER — Emergency Department (HOSPITAL_COMMUNITY): Payer: Medicare PPO

## 2023-12-26 ENCOUNTER — Observation Stay (HOSPITAL_COMMUNITY)
Admission: EM | Admit: 2023-12-26 | Discharge: 2023-12-28 | Disposition: A | Discharge status: Home / Self Care | Payer: Medicare PPO | Attending: Family Medicine | Admitting: Family Medicine

## 2023-12-26 DIAGNOSIS — N390 Urinary tract infection, site not specified: Secondary | ICD-10-CM | POA: Diagnosis not present

## 2023-12-26 DIAGNOSIS — I499 Cardiac arrhythmia, unspecified: Secondary | ICD-10-CM | POA: Insufficient documentation

## 2023-12-26 DIAGNOSIS — E119 Type 2 diabetes mellitus without complications: Secondary | ICD-10-CM

## 2023-12-26 DIAGNOSIS — E66813 Obesity, class 3: Secondary | ICD-10-CM | POA: Diagnosis not present

## 2023-12-26 DIAGNOSIS — Z6841 Body Mass Index (BMI) 40.0 and over, adult: Secondary | ICD-10-CM | POA: Diagnosis not present

## 2023-12-26 DIAGNOSIS — R4182 Altered mental status, unspecified: Secondary | ICD-10-CM | POA: Diagnosis not present

## 2023-12-26 DIAGNOSIS — Z8673 Personal history of transient ischemic attack (TIA), and cerebral infarction without residual deficits: Secondary | ICD-10-CM | POA: Insufficient documentation

## 2023-12-26 DIAGNOSIS — R531 Weakness: Secondary | ICD-10-CM | POA: Diagnosis not present

## 2023-12-26 DIAGNOSIS — I6203 Nontraumatic chronic subdural hemorrhage: Secondary | ICD-10-CM | POA: Diagnosis not present

## 2023-12-26 DIAGNOSIS — T887XXA Unspecified adverse effect of drug or medicament, initial encounter: Secondary | ICD-10-CM | POA: Diagnosis not present

## 2023-12-26 DIAGNOSIS — W19XXXA Unspecified fall, initial encounter: Secondary | ICD-10-CM | POA: Diagnosis present

## 2023-12-26 DIAGNOSIS — Z1152 Encounter for screening for COVID-19: Secondary | ICD-10-CM | POA: Insufficient documentation

## 2023-12-26 DIAGNOSIS — R2681 Unsteadiness on feet: Secondary | ICD-10-CM | POA: Diagnosis not present

## 2023-12-26 DIAGNOSIS — H409 Unspecified glaucoma: Secondary | ICD-10-CM | POA: Insufficient documentation

## 2023-12-26 DIAGNOSIS — M47816 Spondylosis without myelopathy or radiculopathy, lumbar region: Secondary | ICD-10-CM | POA: Diagnosis not present

## 2023-12-26 DIAGNOSIS — R2689 Other abnormalities of gait and mobility: Secondary | ICD-10-CM

## 2023-12-26 DIAGNOSIS — L602 Onychogryphosis: Secondary | ICD-10-CM | POA: Diagnosis not present

## 2023-12-26 DIAGNOSIS — Z7902 Long term (current) use of antithrombotics/antiplatelets: Secondary | ICD-10-CM | POA: Diagnosis not present

## 2023-12-26 DIAGNOSIS — R29898 Other symptoms and signs involving the musculoskeletal system: Secondary | ICD-10-CM | POA: Insufficient documentation

## 2023-12-26 DIAGNOSIS — G9529 Other cord compression: Secondary | ICD-10-CM | POA: Insufficient documentation

## 2023-12-26 DIAGNOSIS — I129 Hypertensive chronic kidney disease with stage 1 through stage 4 chronic kidney disease, or unspecified chronic kidney disease: Secondary | ICD-10-CM | POA: Diagnosis not present

## 2023-12-26 DIAGNOSIS — N183 Chronic kidney disease, stage 3 unspecified: Secondary | ICD-10-CM | POA: Diagnosis not present

## 2023-12-26 DIAGNOSIS — T1490XA Injury, unspecified, initial encounter: Principal | ICD-10-CM

## 2023-12-26 DIAGNOSIS — Z7984 Long term (current) use of oral hypoglycemic drugs: Secondary | ICD-10-CM | POA: Diagnosis not present

## 2023-12-26 DIAGNOSIS — E11649 Type 2 diabetes mellitus with hypoglycemia without coma: Secondary | ICD-10-CM | POA: Diagnosis not present

## 2023-12-26 DIAGNOSIS — E1122 Type 2 diabetes mellitus with diabetic chronic kidney disease: Secondary | ICD-10-CM | POA: Insufficient documentation

## 2023-12-26 DIAGNOSIS — D509 Iron deficiency anemia, unspecified: Secondary | ICD-10-CM | POA: Diagnosis present

## 2023-12-26 DIAGNOSIS — J4489 Other specified chronic obstructive pulmonary disease: Secondary | ICD-10-CM | POA: Diagnosis not present

## 2023-12-26 DIAGNOSIS — E1140 Type 2 diabetes mellitus with diabetic neuropathy, unspecified: Secondary | ICD-10-CM | POA: Insufficient documentation

## 2023-12-26 DIAGNOSIS — S0990XA Unspecified injury of head, initial encounter: Secondary | ICD-10-CM | POA: Diagnosis present

## 2023-12-26 DIAGNOSIS — G8929 Other chronic pain: Secondary | ICD-10-CM | POA: Insufficient documentation

## 2023-12-26 DIAGNOSIS — Z794 Long term (current) use of insulin: Secondary | ICD-10-CM | POA: Insufficient documentation

## 2023-12-26 DIAGNOSIS — M545 Low back pain, unspecified: Secondary | ICD-10-CM | POA: Diagnosis not present

## 2023-12-26 DIAGNOSIS — Z79899 Other long term (current) drug therapy: Secondary | ICD-10-CM

## 2023-12-26 DIAGNOSIS — E6609 Other obesity due to excess calories: Secondary | ICD-10-CM

## 2023-12-26 DIAGNOSIS — Z8659 Personal history of other mental and behavioral disorders: Secondary | ICD-10-CM

## 2023-12-26 LAB — CBC WITH DIFFERENTIAL/PLATELET
Abs Immature Granulocytes: 0.06 10*3/uL (ref 0.00–0.07)
Basophils Absolute: 0 10*3/uL (ref 0.0–0.1)
Basophils Relative: 1 %
Eosinophils Absolute: 0.4 10*3/uL (ref 0.0–0.5)
Eosinophils Relative: 5 %
HCT: 38.7 % (ref 36.0–46.0)
Hemoglobin: 11.1 g/dL — ABNORMAL LOW (ref 12.0–15.0)
Immature Granulocytes: 1 %
Lymphocytes Relative: 35 %
Lymphs Abs: 2.9 10*3/uL (ref 0.7–4.0)
MCH: 22.5 pg — ABNORMAL LOW (ref 26.0–34.0)
MCHC: 28.7 g/dL — ABNORMAL LOW (ref 30.0–36.0)
MCV: 78.3 fL — ABNORMAL LOW (ref 80.0–100.0)
Monocytes Absolute: 0.7 10*3/uL (ref 0.1–1.0)
Monocytes Relative: 8 %
Neutro Abs: 4.2 10*3/uL (ref 1.7–7.7)
Neutrophils Relative %: 50 %
Platelets: 167 10*3/uL (ref 150–400)
RBC: 4.94 MIL/uL (ref 3.87–5.11)
RDW: 18.7 % — ABNORMAL HIGH (ref 11.5–15.5)
WBC: 8.2 10*3/uL (ref 4.0–10.5)
nRBC: 0 % (ref 0.0–0.2)

## 2023-12-26 LAB — URINALYSIS, W/ REFLEX TO CULTURE (INFECTION SUSPECTED)
Bilirubin Urine: NEGATIVE
Glucose, UA: >=500 mg/dL — AB
Hgb urine dipstick: NEGATIVE
Ketones, ur: NEGATIVE mg/dL
Nitrite: NEGATIVE
Protein, ur: NEGATIVE mg/dL
Specific Gravity, Urine: 1.03 (ref 1.005–1.030)
pH: 6 (ref 5.0–8.0)

## 2023-12-26 LAB — COMPREHENSIVE METABOLIC PANEL
ALT: 11 U/L (ref 0–44)
AST: 16 U/L (ref 15–41)
Albumin: 3.1 g/dL — ABNORMAL LOW (ref 3.5–5.0)
Alkaline Phosphatase: 63 U/L (ref 38–126)
Anion gap: 11 (ref 5–15)
BUN: 16 mg/dL (ref 8–23)
CO2: 25 mmol/L (ref 22–32)
Calcium: 9 mg/dL (ref 8.9–10.3)
Chloride: 104 mmol/L (ref 98–111)
Creatinine, Ser: 0.84 mg/dL (ref 0.44–1.00)
GFR, Estimated: >60 mL/min (ref >60)
Glucose, Bld: 128 mg/dL — ABNORMAL HIGH (ref 70–99)
Potassium: 3.9 mmol/L (ref 3.5–5.1)
Sodium: 140 mmol/L (ref 135–145)
Total Bilirubin: 0.4 mg/dL (ref 0.0–1.2)
Total Protein: 6.2 g/dL — ABNORMAL LOW (ref 6.5–8.1)

## 2023-12-26 LAB — MAGNESIUM: Magnesium: 1.9 mg/dL (ref 1.7–2.4)

## 2023-12-26 LAB — TROPONIN I (HIGH SENSITIVITY): Troponin I (High Sensitivity): 4 ng/L (ref <18)

## 2023-12-26 MED ORDER — SODIUM CHLORIDE 0.9 % IV SOLN
1.0000 g | Freq: Once | INTRAVENOUS | Status: AC
  Administered 2023-12-26: 1 g via INTRAVENOUS
  Filled 2023-12-26: qty 10

## 2023-12-26 NOTE — ED Provider Notes (Signed)
 [facility] [facility]ERGENCY DEPARTMENT AT [facility] Provider Note   CSN: [id number] Arrival date & time: 12/26/23  1842     History  No chief complaint on file.   Rachel Nielsen is a 73 y.o. female.  73 year old female presents today for concern of repeated falls.  She lives with her son.  I have attempted to get a hold of him multiple times without success.  Patient is altered.  She is on anticoagulation.  She was activated as a level 2 trauma.  She denies pain in any of her joints.  She does endorse a headache.  The history is provided by the patient. No language interpreter was used.       Home Medications Prior to Admission medications   Medication Sig Start Date End Date Taking? Authorizing Provider  ACCU-CHEK GUIDE test strip Use to check blood sugar up to twice a day as directed 05/26/22   [name], NP  Accu-Chek Softclix Lancets lancets Use to check blood sugar up to twice a day as directed 05/26/22   [name], NP  albuterol (PROVENTIL) (2.5 MG/3ML) 0.083% nebulizer solution Inhale 3 mLs (2.5 mg total) into the lungs every 4 (four) hours as needed for wheezing or shortness of breath. Patient taking differently: Inhale 2.5 mg into the lungs every 6 (six) hours as needed for wheezing or shortness of breath. 12/23/21 11/21/23  Arnetha Courser, MD  albuterol (VENTOLIN HFA) 108 (90 Base) MCG/ACT inhaler Inhale 2-4 puffs by mouth every 4 hours as needed for wheezing, cough, and/or shortness of breath Patient taking differently: 2 puffs every 6 (six) hours as needed. 10/21/20   Malfi, Jodelle Gross, FNP  ALPRAZolam Prudy Feeler) 1 MG tablet Take 1 mg by mouth 3 (three) times daily as needed for anxiety or sleep. 08/19/21   [provider]  amLODipine (NORVASC) 5 MG tablet Take 1 tablet (5 mg total) by mouth daily. 06/29/23   [name], NP  Asenapine Maleate 10 MG SUBL Place 10 mg under the tongue at bedtime. 04/22/21   [provider]  aspirin 81 MG EC tablet  Take 1 tablet (81 mg total) by mouth daily. Swallow whole. 02/10/22   [name], NP  bisoprolol (ZEBETA) 10 MG tablet Take 1 tablet (10 mg total) by mouth daily. OFFICE VISIT NEEDED FOR ADDITIONAL REFILLS 04/26/23   [name], NP  brimonidine (ALPHAGAN) 0.2 % ophthalmic solution 1 drop 3 (three) times daily. 09/09/23   [provider]  buPROPion (WELLBUTRIN XL) 300 MG 24 hr tablet Take 1 tablet (300 mg total) by mouth daily. 10/08/20   Sharman Cheek, MD  busPIRone (BUSPAR) 15 MG tablet Take 15 mg by mouth 2 (two) times daily. 11/08/17   [provider]  clopidogrel (PLAVIX) 75 MG tablet Take 75 mg by mouth daily.    [provider]  divalproex (DEPAKOTE) 250 MG DR tablet Take 250 mg by mouth 2 (two) times daily.    [provider]  docusate sodium (COLACE) 100 MG capsule Take 100 mg by mouth daily.    [provider]  dorzolamide-timolol (COSOPT) 2-0.5 % ophthalmic solution Place 1 drop into both eyes 2 (two) times daily. 06/08/23   [provider]  Dulaglutide 3 MG/0.5ML SOPN Inject 0.5 mLs (3 mg total) subcutaneously once a week 01/07/22   [provider]  famotidine (PEPCID) 20 MG tablet Take 20 mg by mouth 2 (two) times daily.    [provider]  FARXIGA 10 MG TABS tablet Take 10 mg by mouth daily. 09/08/21   [provider]  fluticasone (FLONASE) 50 MCG/ACT nasal spray Place 1 spray into both nostrils daily. 02/16/23   [name], NP  fluticasone-salmeterol (ADVAIR) 250-50 MCG/ACT AEPB Inhale 1 puff into the lungs 2 (two) times daily. 09/09/21   [provider]  gabapentin (NEURONTIN) 600 MG tablet Take 1 tablet (600 mg total) by mouth 3 (three) times daily. 600 mg AM, 600 mg pm and 1200 mg at bedtime 11/22/23   Wouk, Wilfred Curtis, MD  GLOBAL EASE INJECT PEN NEEDLES 32G X 4 MM MISC Use as directed with insulin 05/30/20   Scarboro, Coralee North, NP  Insulin Pen Needle (BD PEN NEEDLE NANO U/F) 32G X 4 MM MISC  Inject 1 Device into the skin daily. 05/19/21   [name], NP  Insulin Syringe-Needle U-100 (INSULIN SYRINGE 1CC/31GX5/16") 31G X 5/16" 1 ML MISC 1 Syringe by Does not apply route in the morning, at noon, in the evening, and at bedtime. 06/24/21   [name], NP  LANTUS SOLOSTAR 100 UNIT/ML Solostar Pen Inject 22 Units into the skin at bedtime. 11/16/23 11/15/24  [provider]  linaclotide Karlene Einstein) 290 MCG CAPS capsule Take 1 capsule (290 mcg total) by mouth daily before breakfast. 04/12/23   [name], NP  metFORMIN (GLUCOPHAGE) 1000 MG tablet Take 1,000 mg by mouth 2 (two) times daily with a meal. 01/21/22   [provider]  montelukast (SINGULAIR) 10 MG tablet Take 1 tablet (10 mg total) by mouth daily. 10/08/20   Sharman Cheek, MD  MYRBETRIQ 25 MG TB24 tablet Take 25 mg by mouth daily. 09/20/23   [provider]  nortriptyline (PAMELOR) 10 MG capsule Take 10 mg by mouth 2 (two) times daily. 11/08/23   [provider]  pantoprazole (PROTONIX) 40 MG tablet Take 1 tablet (40 mg total) by mouth 2 (two) times daily. 09/14/23   [name], NP  pregabalin (LYRICA) 75 MG capsule Take 75 mg by mouth 3 (three) times daily. 09/20/23   [provider]  rosuvastatin (CRESTOR) 10 MG tablet Take 1 tablet (10 mg total) by mouth daily. 09/14/23   [name], NP  TRAVATAN Z 0.004 % SOLN ophthalmic solution Place 1 drop into both eyes at bedtime. 10/08/20   Sharman Cheek, MD      Allergies    Ace inhibitors    Review of Systems   Review of Systems  Unable to perform ROS: Mental status change  Respiratory:  Negative for shortness of breath.   Cardiovascular:  Negative for chest pain.  Gastrointestinal:  Negative for abdominal pain.  Neurological:  Negative for light-headedness.    Physical Exam Updated Vital Signs BP (!) 112/99   Pulse 81   Temp 97.7 F (36.5 C) (Oral)   Resp 18   Ht 5\' 4"  (1.626 m)   Wt 117.9 kg   SpO2 97%    BMI 44.63 kg/m  Physical Exam Vitals and nursing note reviewed.  Constitutional:      General: She is not in acute distress.    Appearance: Normal appearance. She is not ill-appearing.  HENT:     Head: Normocephalic and atraumatic.     Nose: Nose normal.  Eyes:     Conjunctiva/sclera: Conjunctivae normal.  Neck:     Comments: C collar in place Cardiovascular:     Rate and Rhythm: Normal rate and regular rhythm.  Pulmonary:  Effort: Pulmonary effort is normal. No respiratory distress.  Musculoskeletal:        General: No deformity. Normal range of motion.     Cervical back: Normal range of motion.  Skin:    Findings: No rash.  Neurological:     Mental Status: She is alert.     ED Results / Procedures / Treatments   Labs (all labs ordered are listed, but only abnormal results are displayed) Labs Reviewed  CBC WITH DIFFERENTIAL/PLATELET - Abnormal; Notable for the following components:      Result Value   Hemoglobin 11.1 (*)    MCV 78.3 (*)    MCH 22.5 (*)    MCHC 28.7 (*)    RDW 18.7 (*)    All other components within normal limits  COMPREHENSIVE METABOLIC PANEL - Abnormal; Notable for the following components:   Glucose, Bld 128 (*)    Total Protein 6.2 (*)    Albumin 3.1 (*)    All other components within normal limits  URINALYSIS, W/ REFLEX TO CULTURE (INFECTION SUSPECTED) - Abnormal; Notable for the following components:   APPearance HAZY (*)    Glucose, UA >=500 (*)    Leukocytes,Ua LARGE (*)    Bacteria, UA FEW (*)    All other components within normal limits  RESP PANEL BY RT-PCR (RSV, FLU A&B, COVID)  RVPGX2  MAGNESIUM  TROPONIN I (HIGH SENSITIVITY)  TROPONIN I (HIGH SENSITIVITY)    EKG None  Radiology CT Head Wo Contrast Result Date: 12/26/2023 CLINICAL DATA:  Fall injury with blunt polytrauma. EXAM: CT HEAD WITHOUT CONTRAST CT CERVICAL SPINE WITHOUT CONTRAST TECHNIQUE: Multidetector CT imaging of the head and cervical spine was performed  following the standard protocol without intravenous contrast. Multiplanar CT image reconstructions of the cervical spine were also generated. RADIATION DOSE REDUCTION: This exam was performed according to the departmental dose-optimization program which includes automated exposure control, adjustment of the mA and/or kV according to patient size and/or use of iterative reconstruction technique. COMPARISON:  Cervical spine CT 11/21/2023, head CT 11/20/2023. FINDINGS: CT HEAD FINDINGS Brain: Again noted is a 4 mm in thickness left frontal convexity small chronic subdural hematoma. There is no positive mass effect. There is mild global atrophy, mild small-vessel disease of the cerebral white matter with slight atrophic ventricular prominence. No acute hemorrhage, cortical based acute infarct, mass or mass effect is seen. No midline shift. Basal cisterns are clear. There is an empty sella. Again noted is a high left frontoparietal subcortical lacunar infarct, chronic. Vascular: Calcific plaque in both carotid siphons. No hyperdense central vessels. Skull: Negative for fractures or focal lesions. No visible scalp hematoma. Sinuses/Orbits: Mild chronic membrane disease in the maxillary sinuses. Other paranasal sinuses, bilateral mastoid air cells, and middle ears are clear. Negative orbits aside from chronic proptosis. Other: None. CT CERVICAL SPINE FINDINGS Alignment: Straightened and slightly reversed cervical lordosis again noted, centered at C4-5. There is a chronic degenerative 2-3 mm grade 1 anterolisthesis at C4-5. No traumatic or further listhesis. Again noted bone-on-bone anterior atlantodental joint space loss with osteophytes. Skull base and vertebrae: No acute fracture is evident. No primary bone lesion or focal pathologic process. Soft tissues and spinal canal: No prevertebral fluid or swelling. No visible canal hematoma. No laryngeal or thyroid mass. Disc levels: There is disc space loss with prominent  anterior osteophytes C4-5, C5-6 and C6-7. Mild posterior endplate ridging at these levels but no spondylotic cord compression. No herniated discs. The other discs are normal in heights,  small anterior osteophytes again at C3-4. Moderate facet joint hypertrophy with uncinate spurring most levels. Acquired foraminal stenosis again moderate on the right at C2-3, bilaterally severe C3-4, moderate on the right at C4-5, bilaterally mild C5-6 and C6-7. There have been no significant interval changes. Upper chest: Negative. Other: None. IMPRESSION: 1. No acute intracranial CT findings or depressed skull fractures. 2. Stable 4 mm in thickness left frontal convexity small chronic subdural hematoma. 3. Atrophy and small-vessel disease. 4. Chronic proptosis. 5. Straightened and slightly reversed cervical lordosis with degenerative grade 1 anterolisthesis C4-5. No traumatic or further listhesis. No evidence of cervical fracture. 6. Degenerative changes with multilevel foraminal stenosis. Electronically Signed   By: Almira Bar M.D.   On: 12/26/2023 20:20   CT Cervical Spine Wo Contrast Result Date: 12/26/2023 CLINICAL DATA:  Fall injury with blunt polytrauma. EXAM: CT HEAD WITHOUT CONTRAST CT CERVICAL SPINE WITHOUT CONTRAST TECHNIQUE: Multidetector CT imaging of the head and cervical spine was performed following the standard protocol without intravenous contrast. Multiplanar CT image reconstructions of the cervical spine were also generated. RADIATION DOSE REDUCTION: This exam was performed according to the departmental dose-optimization program which includes automated exposure control, adjustment of the mA and/or kV according to patient size and/or use of iterative reconstruction technique. COMPARISON:  Cervical spine CT 11/21/2023, head CT 11/20/2023. FINDINGS: CT HEAD FINDINGS Brain: Again noted is a 4 mm in thickness left frontal convexity small chronic subdural hematoma. There is no positive mass effect. There is  mild global atrophy, mild small-vessel disease of the cerebral white matter with slight atrophic ventricular prominence. No acute hemorrhage, cortical based acute infarct, mass or mass effect is seen. No midline shift. Basal cisterns are clear. There is an empty sella. Again noted is a high left frontoparietal subcortical lacunar infarct, chronic. Vascular: Calcific plaque in both carotid siphons. No hyperdense central vessels. Skull: Negative for fractures or focal lesions. No visible scalp hematoma. Sinuses/Orbits: Mild chronic membrane disease in the maxillary sinuses. Other paranasal sinuses, bilateral mastoid air cells, and middle ears are clear. Negative orbits aside from chronic proptosis. Other: None. CT CERVICAL SPINE FINDINGS Alignment: Straightened and slightly reversed cervical lordosis again noted, centered at C4-5. There is a chronic degenerative 2-3 mm grade 1 anterolisthesis at C4-5. No traumatic or further listhesis. Again noted bone-on-bone anterior atlantodental joint space loss with osteophytes. Skull base and vertebrae: No acute fracture is evident. No primary bone lesion or focal pathologic process. Soft tissues and spinal canal: No prevertebral fluid or swelling. No visible canal hematoma. No laryngeal or thyroid mass. Disc levels: There is disc space loss with prominent anterior osteophytes C4-5, C5-6 and C6-7. Mild posterior endplate ridging at these levels but no spondylotic cord compression. No herniated discs. The other discs are normal in heights, small anterior osteophytes again at C3-4. Moderate facet joint hypertrophy with uncinate spurring most levels. Acquired foraminal stenosis again moderate on the right at C2-3, bilaterally severe C3-4, moderate on the right at C4-5, bilaterally mild C5-6 and C6-7. There have been no significant interval changes. Upper chest: Negative. Other: None. IMPRESSION: 1. No acute intracranial CT findings or depressed skull fractures. 2. Stable 4 mm in  thickness left frontal convexity small chronic subdural hematoma. 3. Atrophy and small-vessel disease. 4. Chronic proptosis. 5. Straightened and slightly reversed cervical lordosis with degenerative grade 1 anterolisthesis C4-5. No traumatic or further listhesis. No evidence of cervical fracture. 6. Degenerative changes with multilevel foraminal stenosis. Electronically Signed   By: Earlean Shawl.D.  On: 12/26/2023 20:20   DG Chest Portable 1 View Result Date: 12/26/2023 CLINICAL DATA:  Fall EXAM: PORTABLE CHEST 1 VIEW COMPARISON:  11/20/2023 FINDINGS: The heart size and mediastinal contours are within normal limits. Elevation of the right hemidiaphragm. No focal airspace consolidation, pleural effusion, or pneumothorax. The visualized skeletal structures are unremarkable. IMPRESSION: No active disease. Electronically Signed   By: Duanne Guess D.O.   On: 12/26/2023 19:35   DG Pelvis Portable Result Date: 12/26/2023 CLINICAL DATA:  Fall EXAM: PORTABLE PELVIS 1-2 VIEWS COMPARISON:  None Available. FINDINGS: There is no evidence of pelvic fracture or diastasis. No pelvic bone lesions are seen. IMPRESSION: Negative. Electronically Signed   By: Duanne Guess D.O.   On: 12/26/2023 19:34    Procedures .Critical Care  Performed by: Marita Kansas, PA-C Authorized by: Marita Kansas, PA-C   Critical care provider statement:    Critical care time (minutes):  30   Critical care was necessary to treat or prevent imminent or life-threatening deterioration of the following conditions:  Trauma   Critical care was time spent personally by me on the following activities:  Development of treatment plan with patient or surrogate, discussions with consultants, evaluation of patient's response to treatment, examination of patient, ordering and review of laboratory studies, ordering and review of radiographic studies, ordering and performing treatments and interventions, pulse oximetry, re-evaluation of patient's  condition and review of old charts   Care discussed with: admitting provider       Medications Ordered in ED Medications  cefTRIAXone (ROCEPHIN) 1 g in sodium chloride 0.9 % 100 mL IVPB (has no administration in time range)    ED Course/ Medical Decision Making/ A&P                                 Medical Decision Making Amount and/or Complexity of Data Reviewed Labs: ordered. Radiology: ordered.   Medical Decision Making / ED Course   This patient presents to the ED for concern of altered mental status, weakness, recurrent falls, fall on thinners, this involves an extensive number of treatment options, and is a complaint that carries with it a high risk of complications and morbidity.  The differential diagnosis includes intracranial bleed, fracture, infection, metabolic encephalopathy, UTI  MDM: 73 year old female presents today for concern of altered mental status, weakness, recurrent falls.  She is fall on thinners.  Activated as a level 2 trauma.  Admission considered but will reevaluate after labs and imaging.  Attempted to reach son multiple times without success.  CBC without leukocytosis.  Mild anemia which is around her baseline.  CMP without acute concerns.  UA shows large leukocytes, 0-5 WBCs.  Potentially UTI.  Will send for urine culture.  Initial troponin normal.  Magnesium within normal.  CT head, CT cervical spine, chest x-ray, x-ray without acute concerns.  EKG without acute ischemic change.  Given she is weaker than baseline and is altered and unable to get a good assessment of what her baseline is will discuss with admitting team for admission and observation.  Discussed with family medicine practice who accepts patient for admission.  Rocephin started.  Lab Tests: -I ordered, reviewed, and interpreted labs.   The pertinent results include:   Labs Reviewed  CBC WITH DIFFERENTIAL/PLATELET - Abnormal; Notable for the following components:      Result Value    Hemoglobin 11.1 (*)    MCV 78.3 (*)  MCH 22.5 (*)    MCHC 28.7 (*)    RDW 18.7 (*)    All other components within normal limits  COMPREHENSIVE METABOLIC PANEL - Abnormal; Notable for the following components:   Glucose, Bld 128 (*)    Total Protein 6.2 (*)    Albumin 3.1 (*)    All other components within normal limits  URINALYSIS, W/ REFLEX TO CULTURE (INFECTION SUSPECTED) - Abnormal; Notable for the following components:   APPearance HAZY (*)    Glucose, UA >=500 (*)    Leukocytes,Ua LARGE (*)    Bacteria, UA FEW (*)    All other components within normal limits  RESP PANEL BY RT-PCR (RSV, FLU A&B, COVID)  RVPGX2  MAGNESIUM  TROPONIN I (HIGH SENSITIVITY)  TROPONIN I (HIGH SENSITIVITY)      EKG  EKG Interpretation Date/Time:    Ventricular Rate:    PR Interval:    QRS Duration:    QT Interval:    QTC Calculation:   R Axis:      Text Interpretation:           Imaging Studies ordered: I ordered imaging studies including CT head, CT cervical spine, chest x-ray, pelvic x-ray I independently visualized and interpreted imaging. I agree with the radiologist interpretation   Medicines ordered and prescription drug management: Meds ordered this encounter  Medications   cefTRIAXone (ROCEPHIN) 1 g in sodium chloride 0.9 % 100 mL IVPB    Antibiotic Indication::   UTI    -I have reviewed the patients home medicines and have made adjustments as needed  Critical interventions Level 2 trauma   Reevaluation: After the interventions noted above, I reevaluated the patient and found that they have :stayed the same  Co morbidities that complicate the patient evaluation  Past Medical History:  Diagnosis Date   Acute metabolic encephalopathy 09/28/2023   Allergy    Anxiety    Asthma    Colon polyps    Constipation    COPD (chronic obstructive pulmonary disease) (HCC)    Depression    GERD (gastroesophageal reflux disease)    Glaucoma    Heart attack (HCC)     Hypertension    Hypokalemia 09/28/2023   Sleep apnea    Sleep apnea    Stroke [facility])    Urinary incontinence       Dispostion: Discussed with family practice service.  They will evaluate patient for admission.   Final Clinical Impression(s) / ED Diagnoses Final diagnoses:  Trauma  Altered mental status, unspecified altered mental status type  Fall, initial encounter  Urinary tract infection without hematuria, site unspecified    Rx / DC Orders ED Discharge Orders     None         Marita Kansas, PA-C 12/26/23 2302    Pricilla Loveless, MD 12/29/23 [phone]

## 2023-12-26 NOTE — H&P (Incomplete)
 Hospital Admission History and Physical Service Pager: [phone]  Patient name: Rachel Nielsen Medical record number: [id number] Date of Birth: Jan 15, 1951 Age: 73 y.o. Gender: female  Primary Care Provider: Morene Crocker, MD Consultants: None Code Status: FULL Preferred Emergency Contact:   Contact Information     Name Relation Home Work [place] Son [phone]  [phone]      Other Contacts   None on File    Chief Complaint: Falls  Assessment and Plan: Rachel Nielsen is a 73 y.o. female presenting with bilateral leg weakness and recent fall. Differential for this patient's presentation of this includes:  Polypharmacy leading to fall: Likely contributing factor.  Patient is on numerous high risk offending agents including: Xanax, valproic acid, gabapentin and Lyrica, and nortriptyline.  However, patient adamant that newest fall represents significant change from previous falls. Deconditioning leading to fall: Likely contributing factor. Patient is very weak generally (minimal grip strength/shoulder shrug).  Sees PT twice a week. UTI leading to AMS and fall: ED noted AMS earlier in admission.  On interview, patient was oriented to self, year, situation and participated fully in interview without concern.  While UA shows large leukocytes, patient denies dysuria, abdominal pain. Head trauma/CVA leading to fall: Neurological exam nonfocal -- weakness appears bilateral, though this is more acute.  Patient denies stroke symptomatology or head trauma. Hypoglycemia, arrhythmia, seizures: All of these less likely given history, exam, and studies. Spinal cord compression/other pathology leading to bilateral leg weakness and fall: Bilateral lower extremity weakness/pain that has progressed from baseline and is significant on our exam.  Is functionally unable to lift lower extremities off of the bed. Can typically ambulate with walker. Patient also notes pain from  approximately T9 dermatome distally.  History of lumbar pathology on recent MRI. Assessment & Plan Fall Likely multifactorial as above.  Reassuringly without acute trauma on exam or CT head/spine.  While less likely acute stroke, given extensive history, will further evaluate neurologic function with MRI brain as well as potential spinal pathology leading to profound bilateral lower extremity weakness with MRI thoracic and MRI lumbar spine. - Admit to FMTS (attending physician Dr. McDiarmid) - Ordered MR head/thoracic/lumbar without contrast. - A.m. BMP/CBC -PT OT to evaluate -Will need further titration of medications to reduce effects of polypharmacy, she sees Dr. Malvin Johns for this already though difficult to ascertain her understanding of medications UTI (urinary tract infection) UA showed large leukocytes.  AMS on admission.  Appears to have mostly resolved with [place] tonight.  Patient denied dysuria, polyuria, and pelvic pain. - Patient s/p 1 g Rocephin - We will continue to monitor, low suspicion for ongoing process, can consider further antibiotics as indicated Microcytic anemia Hgb 11.1.  Appears chronic and stable.  No reports of bleeding. - Ordered iron/TIBC/ferritin labs T2DM (type 2 diabetes mellitus) (HCC) A1c 7.1% as of 01/25.  She is on Lantus, metformin, Farxiga, and dulaglutide at home. - Start resistant SSI and add back home regimen as appropriate - Hold home metformin 1000 mg twice daily Chronic subdural hematoma (HCC) As observed on imaging and appears stable from prior.  Unlikely contributing to current presentation.  She has been on aspirin and Plavix at home given recommendations for indefinite use by neurology in 05/2022.  She has not been on anticoagulation. -Continue home DAPT and lovenox for VTE ppx  Chronic and Stable Conditions: Hypertension: 110s to 130s over 90s.  Continue home amlodipine 5 mg, bisoprolol 10 mg daily. History  of CVA: Continue home DAPT (aspirin  81/Plavix 75 daily)  Diabetic neuropathy: Continue nortriptyline 10 mg nightly, gabapentin 600 mg in a.m. and p.m. and 1200 mg at bedtime, hold Lyrica 75 mg daily Hyperlipidemia: Continue home Crestor 10 mg daily GERD: Continue home Pepcid 20 mg.  Continue home Protonix 40 mg. Unspecified anxiety: Continue home BuSpar 15 mg daily. MDD: Continue home Wellbutrin 300 mg nightly. COPD: Continue home Advair twice daily, continue home Singulair 10 mg nightly. CKD: Creatinine 0.84 WNL at baseline.  Continue to monitor.  FEN/GI: Heart healthy/carb modified diet after passing swallow study VTE Prophylaxis: Lovenox  Disposition: Med/tele  History of Present Illness:  Rachel Nielsen is a 73 y.o. female presenting with falls after hitting her head.  Patient  fell today. She feels like her legs have been giving out on her. These symptoms are waxing and waning over the last 2 months; she has fallen multiple times over this period. The recent episode today happened after getting up out of her chair. Her legs were shaky and a little numb. No dizziness, passing out, palpitations. She noted abrupt shakiness in her legs at the same time after rising from her recliner, which has not happened before. She took all of her medications today other than PM dosing. After she fell, she indicated that she felt pain at a certain level of her abdomen (approximately T9-T10) downwards.  She noted that both of her legs were much weaker than they have ever been, and that this is unusual as she is typically ambulatory with aid of a walker.  She has hit her head before when she fell previously and had a black eye, but she did NOT hit her head today. Because she could not use her legs right today, she called EMS. She does not notice any worsening of her speech or numbness. She has been working with PT at home twice a week since her last admission, which patient notes is curious as she is supposed to be improving but fell today.  She  lives in a house with her son in [place]. She has been PO well and having normal output. She is having her normal arthritis pain right now. No chest pain. Has baseline SOB from COPD. She has not had any dysuria, suprapubic pain, or significant frequency.  In the ED, she was found to be altered.  On interview, patient mentation had improved, was alert and oriented to self, place, situation. She was activated as a level 2 trauma.  Vitals overall reassuring.  Hemoglobin stable from last.  UA with large leukocytes and few bacteria.  She started on Rocephin for this.  CT and x-ray studies without acute findings.  She was called for admission for observation.  Review Of Systems: Per HPI.  Pertinent Past Medical History: Acute metabolic encephalopathy Anxiety Asthma Constipation COPD with centrilobular emphysema GERD CVA Heart attack Sleep apnea Urinary incontinence Cervical radiculopathy CKD stage III CAD Diabetes mellitus Hypertension HLD Physical deconditioning Remainder reviewed in history tab.   Pertinent Past Surgical History: Appendectomy Cholecystectomy Gastrectomy Vaginal hysterectomy Remainder reviewed in history tab.   Pertinent Social History: Tobacco use: Yes/No/Former Alcohol use: Occasional Other Substance use: Denies Lives with son  Pertinent Family History: Mother-hypertension, atrial fibrillation, heart attack Father-hypertension, stomach cancer, diabetes, liver cancer Sister-diabetes, hypertension Remainder reviewed in history tab.   Important Outpatient Medications: Albuterol as needed Xanax 1 mg 3 times daily as needed for anxiety or sleep Amlodipine 5 mg daily Aspirin  81 milligrams daily  Bisoprolol 10 mg daily Brimonidine drops 3 times daily Wellbutrin XL 300 mg daily BuSpar 15 mg twice daily Plavix 75 mg daily Depakote 250 mg twice daily Colace 100 mg daily Cosopt drops twice daily Dulaglutide 3 mg weekly Pepcid 20 mg twice daily Farxiga  10 mg daily Flonase 1 spray daily Advair 1 puff twice daily Gabapentin 600 mg in a.m. and p.m. and 1200 mg at bedtime Lantus 22 units at bedtime Linzess to 90 mcg before breakfast Metformin 1000 mg twice daily Singulair 10 mg daily Myrbetriq 25 mg daily Nortriptyline 10 mg twice daily Protonix 40 mg twice daily Pregabalin 75 mg 3 times daily Crestor 10 mg daily Travatan 1 drop into both eyes at bedtime Remainder reviewed in medication history.   Objective: BP (!) 135/95   Pulse 84   Temp 98 F (36.7 C) (Oral)   Resp 15   Ht 5\' 4"  (1.626 m)   Wt 117.9 kg   SpO2 100%   BMI 44.63 kg/m  Exam: General: Elderly woman laying in hospital bed comfortably, attentive to interview, no acute distress Eyes: Anicteric ENTM: Discolored teeth (patient had recently eaten spaghetti) Cardiovascular: Regular rate and rhythm, no murmurs rubs or gallops Respiratory: Clear to auscultation bilaterally Gastrointestinal: Bowel sounds present, no tenderness, no palpable masses MSK: See neurology examination below Derm: No concerning lesions/rashes Neuro: CN II - XII intact.  Sensation intact bilaterally.  However, patient is very weak overall.  Grip strength -3/5 bilaterally. Of note, strength 3/5 in bilateral lower extremity due to patient able to briefly hold extremities against gravity, which patient noted was an improvement.   Plantar/dorsiflexion of foot 4/5.  Patient had some speech latency likely secondary to prior stroke Psych: Alert, oriented to person, year, place.  Appropriate mood and affect.  Pleasant.  Labs:  CBC BMET  Recent Labs  Lab 12/26/23 1913  WBC 8.2  HGB 11.1*  HCT 38.7  PLT 167   Recent Labs  Lab 12/26/23 1913  NA 140  K 3.9  CL 104  CO2 25  BUN 16  CREATININE 0.84  GLUCOSE 128*  CALCIUM 9.0     Urinalysis    Component Value Date/Time   COLORURINE YELLOW 12/26/2023 2154   APPEARANCEUR HAZY (A) 12/26/2023 2154   APPEARANCEUR Clear 01/26/2022 1104    LABSPEC 1.030 12/26/2023 2154   PHURINE 6.0 12/26/2023 2154   GLUCOSEU >=500 (A) 12/26/2023 2154   HGBUR NEGATIVE 12/26/2023 2154   BILIRUBINUR NEGATIVE 12/26/2023 2154   BILIRUBINUR Negative 01/26/2022 1104   KETONESUR NEGATIVE 12/26/2023 2154   PROTEINUR NEGATIVE 12/26/2023 2154   NITRITE NEGATIVE 12/26/2023 2154   LEUKOCYTESUR LARGE (A) 12/26/2023 2154   Troponin 4 Magnesium 1.9 COVID/flu negative B12  EKG: My own interpretation (not copied from electronic read): Normal sinus rhythm with normal rate, no STEMI, similar to prior  Imaging Studies Performed:  CT head, c-spine IMPRESSION: 1. No acute intracranial CT findings or depressed skull fractures. 2. Stable 4 mm in thickness left frontal convexity small chronic subdural hematoma. 3. Atrophy and small-vessel disease. 4. Chronic proptosis. 5. Straightened and slightly reversed cervical lordosis with degenerative grade 1 anterolisthesis C4-5. No traumatic or further listhesis. No evidence of cervical fracture. 6. Degenerative changes with multilevel foraminal stenosis.  Chest x-ray IMPRESSION: No active disease.  X-ray of pelvis IMPRESSION: Negative.  Tomie China, MD 12/27/2023, 1:11 AM PGY-1, [facility] Family Medicine  FPTS Intern pager: [phone], text pages welcome Secure chat group [facility] Teaching Service  I agree with the assessment and plan as documented above.  Janeal Holmes, MD PGY-2, [facility]

## 2023-12-26 NOTE — ED Triage Notes (Signed)
 Pt BIB  EMS from home c/o falling 5 times today. Pt states she did hit her head and is c/o head and neck pain.   Plavix  20g left forearm

## 2023-12-26 NOTE — ED Notes (Signed)
 Trauma Response Nurse Documentation   Rachel Nielsen is a 73 y.o. female arriving to [facility] via EMS  On clopidogrel 75 mg daily. Trauma was activated as a Level 2 by ED charge RN based on the following trauma criteria Elderly patients > 65 with head trauma on anti-coagulation (excluding ASA).  Patient cleared for CT by Dr. Criss Alvine EDP. Pt transported to CT with trauma response nurse present to monitor. RN remained with the patient throughout their absence from the department for clinical observation.   GCS 14.   History   Past Medical History:  Diagnosis Date   Acute metabolic encephalopathy 09/28/2023   Allergy    Anxiety    Asthma    Colon polyps    Constipation    COPD (chronic obstructive pulmonary disease) (HCC)    Depression    GERD (gastroesophageal reflux disease)    Glaucoma    Heart attack (HCC)    Hypertension    Hypokalemia 09/28/2023   Sleep apnea    Sleep apnea    Stroke [facility])    Urinary incontinence      Past Surgical History:  Procedure Laterality Date   APPENDECTOMY     CHOLECYSTECTOMY     COLONOSCOPY WITH PROPOFOL N/A 07/20/2018   Procedure: COLONOSCOPY WITH PROPOFOL;  Surgeon: Toledo, Boykin Nearing, MD;  Location: [facility] ENDOSCOPY;  Service: Gastroenterology;  Laterality: N/A;   ESOPHAGOGASTRODUODENOSCOPY N/A 07/20/2018   Procedure: ESOPHAGOGASTRODUODENOSCOPY (EGD);  Surgeon: Toledo, Boykin Nearing, MD;  Location: [facility] ENDOSCOPY;  Service: Gastroenterology;  Laterality: N/A;   GASTRECTOMY     TUBAL LIGATION     VAGINAL HYSTERECTOMY         Initial Focused Assessment (If applicable, or please see trauma documentation): presents via EMS from home after multiple falls today. Reports head injury and neck pain. Arrives in EMS collar. C/o back pain to me.  Airway patent, BS clear No obvious uncontrolled hemorrhage GCS 15 PERRLA 3  CT's Completed:   CT Head and CT C-Spine   Interventions:  IV start and trauma lab draw Portable chest and pelvis XRAY CT  head and c-spine EKG  Plan for disposition:    Consults completed:    Event Summary: Presents  via EMS after multiple falls today. Reports back pain on my assessment. Reports head and neck pain in triage. I escorted pt to CT and back with no issues. Trauma scans negative. MTP Summary (If applicable): NA  Bedside handoff with ED RN Janelle Floor.    Ralf Konopka O Victory Strollo  Trauma Response RN  Please call TRN at [phone] for further assistance.

## 2023-12-26 NOTE — ED Notes (Signed)
 Patient transported to CT

## 2023-12-27 ENCOUNTER — Inpatient Hospital Stay (HOSPITAL_COMMUNITY): Payer: Medicare PPO

## 2023-12-27 ENCOUNTER — Encounter (HOSPITAL_COMMUNITY): Payer: Self-pay

## 2023-12-27 ENCOUNTER — Other Ambulatory Visit: Payer: Self-pay

## 2023-12-27 DIAGNOSIS — E1139 Type 2 diabetes mellitus with other diabetic ophthalmic complication: Secondary | ICD-10-CM | POA: Diagnosis not present

## 2023-12-27 DIAGNOSIS — M47816 Spondylosis without myelopathy or radiculopathy, lumbar region: Secondary | ICD-10-CM | POA: Diagnosis present

## 2023-12-27 DIAGNOSIS — Z8659 Personal history of other mental and behavioral disorders: Secondary | ICD-10-CM

## 2023-12-27 DIAGNOSIS — G8929 Other chronic pain: Secondary | ICD-10-CM | POA: Insufficient documentation

## 2023-12-27 DIAGNOSIS — R2689 Other abnormalities of gait and mobility: Secondary | ICD-10-CM | POA: Diagnosis not present

## 2023-12-27 DIAGNOSIS — S0990XA Unspecified injury of head, initial encounter: Secondary | ICD-10-CM | POA: Diagnosis not present

## 2023-12-27 DIAGNOSIS — I6203 Nontraumatic chronic subdural hemorrhage: Secondary | ICD-10-CM

## 2023-12-27 DIAGNOSIS — Z79899 Other long term (current) drug therapy: Secondary | ICD-10-CM

## 2023-12-27 DIAGNOSIS — D509 Iron deficiency anemia, unspecified: Secondary | ICD-10-CM | POA: Diagnosis present

## 2023-12-27 DIAGNOSIS — H409 Unspecified glaucoma: Secondary | ICD-10-CM | POA: Insufficient documentation

## 2023-12-27 DIAGNOSIS — R29898 Other symptoms and signs involving the musculoskeletal system: Secondary | ICD-10-CM | POA: Diagnosis not present

## 2023-12-27 DIAGNOSIS — L602 Onychogryphosis: Secondary | ICD-10-CM | POA: Diagnosis present

## 2023-12-27 LAB — CBC
HCT: 36.2 % (ref 36.0–46.0)
Hemoglobin: 10.5 g/dL — ABNORMAL LOW (ref 12.0–15.0)
MCH: 22.4 pg — ABNORMAL LOW (ref 26.0–34.0)
MCHC: 29 g/dL — ABNORMAL LOW (ref 30.0–36.0)
MCV: 77.4 fL — ABNORMAL LOW (ref 80.0–100.0)
Platelets: 170 10*3/uL (ref 150–400)
RBC: 4.68 MIL/uL (ref 3.87–5.11)
RDW: 18.6 % — ABNORMAL HIGH (ref 11.5–15.5)
WBC: 8.7 10*3/uL (ref 4.0–10.5)
nRBC: 0 % (ref 0.0–0.2)

## 2023-12-27 LAB — BASIC METABOLIC PANEL
Anion gap: 10 (ref 5–15)
Anion gap: 11 (ref 5–15)
BUN: 11 mg/dL (ref 8–23)
BUN: 11 mg/dL (ref 8–23)
CO2: 26 mmol/L (ref 22–32)
CO2: 25 mmol/L (ref 22–32)
Calcium: 8.7 mg/dL — ABNORMAL LOW (ref 8.9–10.3)
Calcium: 8.8 mg/dL — ABNORMAL LOW (ref 8.9–10.3)
Chloride: 105 mmol/L (ref 98–111)
Chloride: 107 mmol/L (ref 98–111)
Creatinine, Ser: 0.71 mg/dL (ref 0.44–1.00)
Creatinine, Ser: 0.89 mg/dL (ref 0.44–1.00)
GFR, Estimated: >60 mL/min (ref >60)
GFR, Estimated: >60 mL/min (ref >60)
Glucose, Bld: 99 mg/dL (ref 70–99)
Glucose, Bld: 109 mg/dL — ABNORMAL HIGH (ref 70–99)
Potassium: 3.4 mmol/L — ABNORMAL LOW (ref 3.5–5.1)
Potassium: 4.4 mmol/L (ref 3.5–5.1)
Sodium: 141 mmol/L (ref 135–145)
Sodium: 143 mmol/L (ref 135–145)

## 2023-12-27 LAB — GLUCOSE, CAPILLARY
Glucose-Capillary: 124 mg/dL — ABNORMAL HIGH (ref 70–99)
Glucose-Capillary: 150 mg/dL — ABNORMAL HIGH (ref 70–99)

## 2023-12-27 LAB — CBG MONITORING, ED
Glucose-Capillary: 91 mg/dL (ref 70–99)
Glucose-Capillary: 123 mg/dL — ABNORMAL HIGH (ref 70–99)

## 2023-12-27 LAB — RESP PANEL BY RT-PCR (RSV, FLU A&B, COVID)  RVPGX2
Influenza A by PCR: NEGATIVE
Influenza B by PCR: NEGATIVE
Resp Syncytial Virus by PCR: NEGATIVE
SARS Coronavirus 2 by RT PCR: NEGATIVE

## 2023-12-27 LAB — IRON AND TIBC
Iron: 47 ug/dL (ref 28–170)
Saturation Ratios: 10 % — ABNORMAL LOW (ref 10.4–31.8)
TIBC: 456 ug/dL — ABNORMAL HIGH (ref 250–450)
UIBC: 409 ug/dL

## 2023-12-27 LAB — FERRITIN: Ferritin: 11 ng/mL (ref 11–307)

## 2023-12-27 LAB — TROPONIN I (HIGH SENSITIVITY): Troponin I (High Sensitivity): 4 ng/L (ref <18)

## 2023-12-27 MED ORDER — MIRABEGRON ER 25 MG PO TB24
25.0000 mg | ORAL_TABLET | Freq: Every day | ORAL | Status: DC
  Administered 2023-12-27: 25 mg via ORAL
  Filled 2023-12-27: qty 1

## 2023-12-27 MED ORDER — ALBUTEROL SULFATE (2.5 MG/3ML) 0.083% IN NEBU: 2.5000 mg | INHALATION_SOLUTION | RESPIRATORY_TRACT | Status: DC | PRN

## 2023-12-27 MED ORDER — DAPAGLIFLOZIN PROPANEDIOL 10 MG PO TABS
10.0000 mg | ORAL_TABLET | Freq: Every day | ORAL | Status: DC
  Administered 2023-12-27 – 2023-12-28 (×2): 10 mg via ORAL
  Filled 2023-12-27 (×2): qty 1

## 2023-12-27 MED ORDER — BISOPROLOL FUMARATE 10 MG PO TABS
10.0000 mg | ORAL_TABLET | Freq: Every day | ORAL | Status: DC
  Administered 2023-12-27 – 2023-12-28 (×2): 10 mg via ORAL
  Filled 2023-12-27 (×2): qty 1

## 2023-12-27 MED ORDER — ALPRAZOLAM 0.25 MG PO TABS: 1.0000 mg | ORAL_TABLET | Freq: Three times a day (TID) | ORAL | Status: DC | PRN

## 2023-12-27 MED ORDER — CLOPIDOGREL BISULFATE 75 MG PO TABS
75.0000 mg | ORAL_TABLET | Freq: Every day | ORAL | Status: DC
  Administered 2023-12-27 – 2023-12-28 (×2): 75 mg via ORAL
  Filled 2023-12-27 (×2): qty 1

## 2023-12-27 MED ORDER — LATANOPROST 0.005 % OP SOLN
1.0000 [drp] | Freq: Every day | OPHTHALMIC | Status: DC
  Administered 2023-12-27: 1 [drp] via OPHTHALMIC
  Filled 2023-12-27: qty 2.5

## 2023-12-27 MED ORDER — PANTOPRAZOLE SODIUM 40 MG PO TBEC
40.0000 mg | DELAYED_RELEASE_TABLET | Freq: Two times a day (BID) | ORAL | Status: DC
  Administered 2023-12-27 – 2023-12-28 (×3): 40 mg via ORAL
  Filled 2023-12-27 (×3): qty 1

## 2023-12-27 MED ORDER — AMLODIPINE BESYLATE 5 MG PO TABS
5.0000 mg | ORAL_TABLET | Freq: Every day | ORAL | Status: DC
  Administered 2023-12-27 – 2023-12-28 (×2): 5 mg via ORAL
  Filled 2023-12-27 (×2): qty 1

## 2023-12-27 MED ORDER — ALPRAZOLAM 0.5 MG PO TABS
0.5000 mg | ORAL_TABLET | Freq: Three times a day (TID) | ORAL | Status: DC
  Administered 2023-12-27 – 2023-12-28 (×3): 0.5 mg via ORAL
  Filled 2023-12-27: qty 2
  Filled 2023-12-27 (×3): qty 1

## 2023-12-27 MED ORDER — BRIMONIDINE TARTRATE 0.2 % OP SOLN
1.0000 [drp] | Freq: Three times a day (TID) | OPHTHALMIC | Status: DC
  Administered 2023-12-27 – 2023-12-28 (×4): 1 [drp] via OPHTHALMIC
  Filled 2023-12-27 (×2): qty 5

## 2023-12-27 MED ORDER — ASENAPINE MALEATE 5 MG SL SUBL
10.0000 mg | SUBLINGUAL_TABLET | Freq: Every day | SUBLINGUAL | Status: DC
  Administered 2023-12-27: 10 mg via SUBLINGUAL
  Filled 2023-12-27: qty 2

## 2023-12-27 MED ORDER — BUPROPION HCL ER (XL) 150 MG PO TB24
300.0000 mg | ORAL_TABLET | Freq: Every day | ORAL | Status: DC
  Administered 2023-12-27: 300 mg via ORAL
  Filled 2023-12-27: qty 2

## 2023-12-27 MED ORDER — POTASSIUM CHLORIDE CRYS ER 20 MEQ PO TBCR
40.0000 meq | EXTENDED_RELEASE_TABLET | Freq: Once | ORAL | Status: AC
  Administered 2023-12-27: 40 meq via ORAL
  Filled 2023-12-27: qty 2

## 2023-12-27 MED ORDER — FAMOTIDINE 20 MG PO TABS
20.0000 mg | ORAL_TABLET | Freq: Every day | ORAL | Status: DC
  Administered 2023-12-27 – 2023-12-28 (×2): 20 mg via ORAL
  Filled 2023-12-27 (×2): qty 1

## 2023-12-27 MED ORDER — ROSUVASTATIN CALCIUM 5 MG PO TABS
10.0000 mg | ORAL_TABLET | Freq: Every day | ORAL | Status: DC
  Administered 2023-12-27 – 2023-12-28 (×2): 10 mg via ORAL
  Filled 2023-12-27 (×2): qty 2

## 2023-12-27 MED ORDER — MOMETASONE FURO-FORMOTEROL FUM 200-5 MCG/ACT IN AERO
2.0000 | INHALATION_SPRAY | Freq: Two times a day (BID) | RESPIRATORY_TRACT | Status: DC
  Administered 2023-12-27: 2 via RESPIRATORY_TRACT
  Filled 2023-12-27 (×2): qty 8.8

## 2023-12-27 MED ORDER — ENOXAPARIN SODIUM 40 MG/0.4ML IJ SOSY
40.0000 mg | PREFILLED_SYRINGE | Freq: Every day | INTRAMUSCULAR | Status: DC
  Administered 2023-12-27 – 2023-12-28 (×2): 40 mg via SUBCUTANEOUS
  Filled 2023-12-27 (×2): qty 0.4

## 2023-12-27 MED ORDER — ASPIRIN 81 MG PO TBEC
81.0000 mg | DELAYED_RELEASE_TABLET | Freq: Every day | ORAL | Status: DC
  Administered 2023-12-27 – 2023-12-28 (×2): 81 mg via ORAL
  Filled 2023-12-27 (×2): qty 1

## 2023-12-27 MED ORDER — DIVALPROEX SODIUM 250 MG PO DR TAB: 250.0000 mg | DELAYED_RELEASE_TABLET | Freq: Every day | ORAL | Status: DC

## 2023-12-27 MED ORDER — BUSPIRONE HCL 5 MG PO TABS
15.0000 mg | ORAL_TABLET | Freq: Every day | ORAL | Status: DC
  Administered 2023-12-27 – 2023-12-28 (×2): 15 mg via ORAL
  Filled 2023-12-27: qty 2
  Filled 2023-12-27: qty 1

## 2023-12-27 MED ORDER — DORZOLAMIDE HCL-TIMOLOL MAL 2-0.5 % OP SOLN
1.0000 [drp] | Freq: Two times a day (BID) | OPHTHALMIC | Status: DC
  Administered 2023-12-27 – 2023-12-28 (×3): 1 [drp] via OPHTHALMIC
  Filled 2023-12-27: qty 10

## 2023-12-27 MED ORDER — INSULIN ASPART 100 UNIT/ML IJ SOLN
0.0000 [IU] | Freq: Three times a day (TID) | INTRAMUSCULAR | Status: DC
  Administered 2023-12-27 (×2): 3 [IU] via SUBCUTANEOUS

## 2023-12-27 MED ORDER — MONTELUKAST SODIUM 10 MG PO TABS
10.0000 mg | ORAL_TABLET | Freq: Every day | ORAL | Status: DC
  Administered 2023-12-27 – 2023-12-28 (×2): 10 mg via ORAL
  Filled 2023-12-27 (×2): qty 1

## 2023-12-27 MED ORDER — LINACLOTIDE 145 MCG PO CAPS
290.0000 ug | ORAL_CAPSULE | Freq: Every day | ORAL | Status: DC
  Administered 2023-12-27 – 2023-12-28 (×2): 290 ug via ORAL
  Filled 2023-12-27 (×2): qty 2

## 2023-12-27 MED ORDER — NORTRIPTYLINE HCL 10 MG PO CAPS
10.0000 mg | ORAL_CAPSULE | Freq: Every day | ORAL | Status: DC
  Administered 2023-12-27: 10 mg via ORAL
  Filled 2023-12-27: qty 1

## 2023-12-27 MED ORDER — GABAPENTIN 300 MG PO CAPS
600.0000 mg | ORAL_CAPSULE | Freq: Every day | ORAL | Status: DC
  Administered 2023-12-27 – 2023-12-28 (×2): 600 mg via ORAL
  Filled 2023-12-27 (×2): qty 2

## 2023-12-27 NOTE — Assessment & Plan Note (Addendum)
 Likely multifactorial as above.  Reassuringly without acute trauma on exam or CT head/spine.  While less likely acute stroke, given extensive history, will further evaluate neurologic function with MRI brain as well as potential spinal pathology leading to profound bilateral lower extremity weakness with MRI thoracic and MRI lumbar spine. - Admit to FMTS (attending physician Dr. McDiarmid) - Ordered MR head/thoracic/lumbar without contrast. - A.m. BMP/CBC -PT OT to evaluate -Will need further titration of medications to reduce effects of polypharmacy, she sees Dr. Malvin Johns for this already though difficult to ascertain her understanding of medications

## 2023-12-27 NOTE — Assessment & Plan Note (Addendum)
 A1c 7.1% as of 01/25.  She is on Lantus, metformin, Farxiga, and dulaglutide at home. - Continue resistant SSI  -  Restart Farxiga  - Hold home metformin 1000 mg twice daily

## 2023-12-27 NOTE — Progress Notes (Addendum)
 Daily Progress Note Intern Pager: [phone]  Patient name: Rachel Nielsen Medical record number: [id number] Date of birth: [birth year] Age: 73 y.o. Gender: female  Primary Care Provider: Morene Crocker, MD Consultants: PT/OT  Code Status: Full Code   Pt Overview and Major Events to Date:  2/23 Admitted   Assessment and Plan: Rachel Nielsen is a 73 y.o. female with PMHx of COPD, history of CVA who presented with with bilateral leg weakness and multiple recent falls.  Assessment & Plan Fall Likely multifactorial given polypharmacy of multiple central acting agents prior to admission, deconditioning and lumbar pathology noted on recent MRI. Reassuringly without acute trauma on exam or CT head/spine. MRI brain without acute intracranial process and bilateral chronic subdural hematomas.MR Lumbar spine with mild left L5-S1 foraminal stenosis and moderate L4-L5 and L5-S1 face arthrosis. Negative for spinal canal stenosis.  - AM BMP/CBC -PT/OT to evaluate -Will need further titration of medications to reduce effects of polypharmacy prior to discharge, she sees Dr. Malvin Johns for this already though difficult to ascertain her understanding of medications UTI (urinary tract infection) UA showed large leukocytes, few bacteria, negative for nitrites.  AMS on admission. Continues to improve and patient oriented x3.  Patient denies any dysuria, polyuria, and pelvic pain. Patient afebrile and WBC stable at 8.7.  - Patient s/p 1 g Rocephin - CTM low suspicion for ongoing process, will defer antibiotics  Microcytic anemia Hgb 11.1 on admission >>> 10.5 this morning. Appears chronic and stable.  No reports of bleeding. - Follow-up iron/TIBC/ferritin labs - AM CBC  T2DM (type 2 diabetes mellitus) (HCC) A1c 7.1% as of 01/25.  She is on Lantus, metformin, Farxiga, and dulaglutide at home. - Continue resistant SSI  -  Restart Farxiga  - Hold home metformin 1000 mg twice daily Chronic subdural hematoma  (HCC) As observed on imaging and appears stable from prior.  Unlikely contributing to current presentation.  She has been on aspirin and Plavix at home given recommendations for indefinite use by neurology in 05/2022.  She has not been on anticoagulation. -Continue home DAPT and lovenox for VTE ppx, upon last neurology note, patient supposed to continue on indefinitely  Chronic and Stable conditions:   Hypertension: 110s to 140s over 80-90s.  Continue home amlodipine 5 mg, bisoprolol 10 mg daily. History of CVA: Continue home DAPT (aspirin 81/Plavix 75 daily)  Diabetic neuropathy: Continue nortriptyline 10 mg nightly, continue gabapentin 600 mg in a.m (holding PM dose) and 1200 mg (holding) at bedtime, hold Lyrica 75 mg daily Hyperlipidemia: Continue home Crestor 10 mg daily GERD: Continue home Pepcid 20 mg. Continue home Protonix 40 mg. Unspecified anxiety: Continue home BuSpar 15 mg daily. MDD: Continue home Wellbutrin 300 mg nightly. COPD: Continue home Advair twice daily, continue home Singulair 10 mg nightly. CKD: Creatinine 0.84 WNL at baseline. Continue to monitor.   FEN/GI: Heart healthy/carb modified diet after passing swallow study VTE Prophylaxis: Lovenox  Dispo:Home pending PT/OT evaluation   Subjective:  Patient improving this morning.   Objective: Temp:  [97.7 F (36.5 C)-98 F (36.7 C)] 97.8 F (36.6 C) (02/24 0455) Pulse Rate:  [78-84] 78 (02/24 0700) Resp:  [15-21] 21 (02/24 0700) BP: (112-149)/(88-99) 134/93 (02/24 0700) SpO2:  [95 %-100 %] 97 % (02/24 0700) Weight:  [117.9 kg] 117.9 kg (02/23 1912) Physical Exam: General: No acute distress, resting comfortably in bed Cardiovascular: Regular rate and rhythm, no murmurs appreciated Respiratory: Normal work of breathing, CTAB and anterior lung fields no  wheezing appreciated Abdomen: Soft, nontender, nondistended and normoactive bowel sounds  Laboratory: Most recent CBC Lab Results  Component Value Date   WBC  8.7 12/27/2023   HGB 10.5 (L) 12/27/2023   HCT 36.2 12/27/2023   MCV 77.4 (L) 12/27/2023   PLT 170 12/27/2023   Most recent BMP    Latest Ref Rng & Units 12/27/2023    4:42 AM  BMP  Glucose 70 - 99 mg/dL 99   BUN 8 - 23 mg/dL 11   Creatinine 7.42 - 1.00 mg/dL 5.95   Sodium 638 - 756 mmol/L 141   Potassium 3.5 - 5.1 mmol/L 3.4   Chloride 98 - 111 mmol/L 105   CO2 22 - 32 mmol/L 26   Calcium 8.9 - 10.3 mg/dL 8.7      Imaging/Diagnostic Tests:  CT Head and CT Cervical spine w/o contrast:  IMPRESSION:  1. No acute intracranial CT findings or depressed skull fractures. 2. Stable 4 mm in thickness left frontal convexity small chronic subdural hematoma. 3. Atrophy and small-vessel disease. 4. Chronic proptosis. 5. Straightened and slightly reversed cervical lordosis with degenerative grade 1 anterolisthesis C4-5. No traumatic or further listhesis. No evidence of cervical fracture. 6. Degenerative changes with multilevel foraminal stenosis.  MRI:  IMPRESSION:  1. No acute intracranial abnormality. 2. Trace bilateral chronic subdural hematomas. 3. Findings of chronic small vessel ischemia.  MR Thoracic spine:  Normal MRI of the thoracic spine.   MR Lumbar spine:  IMPRESSION:  1. Unchanged mild left L5-S1 neural foraminal stenosis. 2. Moderate L4-L5 and L5-S1 facet arthrosis. 3. No spinal canal stenosis.   Peterson Ao, MD 12/27/2023, 7:53 AM  PGY-1, [facility] Family Medicine FPTS Intern pager: [phone], text pages welcome Secure chat group [facility] Teaching Service

## 2023-12-27 NOTE — Progress Notes (Signed)
 Physical Therapy Evaluation Patient Details Name: Rachel Nielsen MRN: [id number] DOB: 02-27-1951 Today's Date: 12/27/2023  History of Present Illness  73 y.o. female who presented 2/23 with with bilateral leg weakness and multiple recent falls. Likely multifactorial given polypharmacy of multiple central acting agents prior to admission, deconditioning and lumbar pathology. +UTI.  PMHx of COPD, history of CVA .   Clinical Impression  Pt admitted with above diagnosis. Reports she has been working with HHPT PTA to improve strength, gait, and balance. Still having falls - attributes to polypharmacy as well. Prodromal symptoms of LE wobbling/weakness. Denies dizziness/lightheadedness or other pre-syncopal symptoms. States she has 24/7 assist from son, aides 2x/week. Able to ambulate in room at [facility] level with RW today. Anticipate good recovery functionally as medical issues improve. Should return to HHPT at d/c to continue improving function. Pt currently with functional limitations due to the deficits listed below (see PT Problem List). Pt will benefit from acute skilled PT to increase their independence and safety with mobility to allow discharge.           If plan is discharge home, recommend the following: A little help with walking and/or transfers;A little help with bathing/dressing/bathroom;Assistance with cooking/housework;Help with stairs or ramp for entrance;Assist for transportation   Can travel by private vehicle        Equipment Recommendations None recommended by PT  Recommendations for Other Services       Functional Status Assessment Patient has had a recent decline in their functional status and demonstrates the ability to make significant improvements in function in a reasonable and predictable amount of time.     Precautions / Restrictions Precautions Precautions: Fall Recall of Precautions/Restrictions: Intact Restrictions Weight Bearing Restrictions Per Provider Order:  No      Mobility  Bed Mobility Overal bed mobility: Needs Assistance Bed Mobility: Supine to Sit, Sit to Supine     Supine to sit: Contact guard Sit to supine: Contact guard assist   General bed mobility comments: CGA for safety, some difficulty rise to EOB. No problem getting back into bed. Cues for technique. leaning backwards inititally.    Transfers Overall transfer level: Needs assistance Equipment used: Rolling walker (2 wheels) Transfers: Sit to/from Stand Sit to Stand: Contact guard assist           General transfer comment: CGA for safety, RW for support upon rising. Cues for technique. Slight posterior lean with legs braced against bed.    Ambulation/Gait Ambulation/Gait assistance: Contact guard assist Gait Distance (Feet): 25 Feet Assistive device: Rolling walker (2 wheels) Gait Pattern/deviations: Step-through pattern, Decreased stride length Gait velocity: dec Gait velocity interpretation: <1.31 ft/sec, indicative of household ambulator   General Gait Details: Slow and guarded. good RW control. Cues for safety, awareness, and proximity to device when turning and sitting (pushed to side prior to sitting.) No buckling noted. Pt subjectively reports RLE becoming weak and tremulous towards end of distance.  Stairs            Wheelchair Mobility     Tilt Bed    Modified Rankin (Stroke Patients Only)       Balance Overall balance assessment: Needs assistance Sitting-balance support: No upper extremity supported, Feet supported Sitting balance-Leahy Scale: Fair Sitting balance - Comments: Improved when feet on floor, initially leaning backwards. Postural control: Posterior lean Standing balance support: Single extremity supported Standing balance-Leahy Scale: Poor  Pertinent Vitals/Pain Pain Assessment Pain Assessment: No/denies pain    Home Living Family/patient expects to be discharged to::  Private residence Living Arrangements: Children Available Help at Discharge: Family;Available 24 hours/day Type of Home: House Home Access: Stairs to enter Entrance Stairs-Rails: None Entrance Stairs-Number of Steps: Small threshold at back entrance   Home Layout: One level Home Equipment: Rollator (4 wheels);Cane - single point;Grab bars - tub/shower;Rolling Walker (2 wheels);Tub bench Additional Comments: Aide 2x/week (for 3 hours each time)    Prior Function Prior Level of Function : History of Falls (last six months);Needs assist             Mobility Comments: Uses SPC when going out to car (d/t walker not fitting through back door).  Uses 4ww for household mobility (uses w/c when goes to medical appts).  H/o falls in last 6 months. ADLs Comments: States son is within an "ears reach" in case she needs assist/falls when showering.     Extremity/Trunk Assessment   Upper Extremity Assessment Upper Extremity Assessment: Defer to OT evaluation    Lower Extremity Assessment Lower Extremity Assessment: Generalized weakness       Communication   Communication Communication: No apparent difficulties    Cognition Arousal: Alert Behavior During Therapy: WFL for tasks assessed/performed   PT - Cognitive impairments: No family/caregiver present to determine baseline                         Following commands: Intact       Cueing Cueing Techniques: Verbal cues     General Comments General comments (skin integrity, edema, etc.): Educated on awareness of prodromal symptoms that result in LEs buckling. She was able to identify tremors in LEs before buckling occurs.    Exercises General Exercises - Lower Extremity Ankle Circles/Pumps: AROM, Both, 10 reps, Supine Gluteal Sets: Strengthening, Both, 10 reps, Supine Short Arc Quad: Strengthening, Both, 10 reps, Supine Hip Flexion/Marching: Strengthening, Both, 10 reps, Seated   Assessment/Plan    PT Assessment  Patient needs continued PT services  PT Problem List Decreased strength;Decreased activity tolerance;Decreased balance;Decreased mobility;Decreased knowledge of use of DME;Impaired sensation       PT Treatment Interventions DME instruction;Gait training;Stair training;Functional mobility training;Therapeutic activities;Therapeutic exercise;Balance training;Neuromuscular re-education;Patient/family education    PT Goals (Current goals can be found in the Care Plan section)  Acute Rehab PT Goals Patient Stated Goal: Get well go home, not fall PT Goal Formulation: With patient Time For Goal Achievement: 01/10/24 Potential to Achieve Goals: Good    Frequency Min 1X/week     Co-evaluation               AM-PAC PT "6 Clicks" Mobility  Outcome Measure Help needed turning from your back to your side while in a flat bed without using bedrails?: None Help needed moving from lying on your back to sitting on the side of a flat bed without using bedrails?: A Little Help needed moving to and from a bed to a chair (including a wheelchair)?: A Little Help needed standing up from a chair using your arms (e.g., wheelchair or bedside chair)?: A Little Help needed to walk in hospital room?: A Little Help needed climbing 3-5 steps with a railing? : A Little 6 Click Score: 19    End of Session Equipment Utilized During Treatment: Gait belt Activity Tolerance: Patient tolerated treatment well Patient left: in bed;with call bell/phone within reach;with bed alarm set ((chair mode)) Nurse Communication: Mobility  status PT Visit Diagnosis: Unsteadiness on feet (R26.81);Other abnormalities of gait and mobility (R26.89);Repeated falls (R29.6);Muscle weakness (generalized) (M62.81);History of falling (Z91.81)    Time: 1610-9604 PT Time Calculation (min) (ACUTE ONLY): 22 min   Charges:   PT Evaluation $PT Eval Low Complexity: 1 Low   PT General Charges $$ ACUTE PT VISIT: 1 Visit          Kathlyn Sacramento, PT, DPT [facility]  Rehabilitation Services Physical Therapist Office: [phone] Website: [email or web address]   Berton Mount 12/27/2023, 6:03 PM

## 2023-12-27 NOTE — Assessment & Plan Note (Addendum)
 Hgb 11.1.  Appears chronic and stable.  No reports of bleeding. - Ordered iron/TIBC/ferritin labs

## 2023-12-27 NOTE — Assessment & Plan Note (Addendum)
 As observed on imaging and appears stable from prior.  Unlikely contributing to current presentation.  She has been on aspirin and Plavix at home given recommendations for indefinite use by neurology in 05/2022.  She has not been on anticoagulation. -Continue home DAPT and lovenox for VTE ppx

## 2023-12-27 NOTE — Assessment & Plan Note (Signed)
 Hgb 11.1 on admission >>> 10.5 this morning. Appears chronic and stable.  No reports of bleeding. - Follow-up iron/TIBC/ferritin labs - AM CBC

## 2023-12-27 NOTE — ED Notes (Signed)
 Pt to MRI

## 2023-12-27 NOTE — Discharge Instructions (Signed)
 Dear Rachel Nielsen,  Thank you for letting us participate in your care. You were hospitalized for recurrent falls. We suspect your falls could be due to the multiple medications you take together (pre-gabalin, gabapentin, xanax or depakote). Although these medicines can be helpful, they can put you at an increased risk for falls if dosage is too high. You did get a CT and MRI of your head and spine. Those images were reassuring and did not show stroke, mass or significant narrowing or pinching of nerves.  Images did show a small stable bleed which has been seen on prior scans before.  POST-HOSPITAL & CARE INSTRUCTIONS Discontinue taking lyrica(pre-gabalin) and depakote Continue taking Gabapentin 600 mg daily   Continue to discuss medications and changes with specialist and primary provider Go to your follow up appointments (listed below)  DOCTOR'S APPOINTMENT   Call Dr. Gweneth Fritter office and see if they can follow-up with you sooner  with your neurologist  2. Schedule follow-up appointment with your primary provider 1 week after discharge  Take care and be well!  Family Medicine Teaching Service Inpatient Team [place]  [facility]  [address] [place], [place] [address] [phone]

## 2023-12-27 NOTE — Care Management Obs Status (Cosign Needed)
 MEDICARE OBSERVATION STATUS NOTIFICATION   Patient Details  Name: Rachel Nielsen MRN: [id number] Date of Birth: 01/30/1951   Medicare Observation Status Notification Given:  Yes    Janae Bridgeman, RN 12/27/2023, 4:02 PM

## 2023-12-27 NOTE — Assessment & Plan Note (Addendum)
 UA showed large leukocytes.  AMS on admission.  Appears to have mostly resolved with [place] tonight.  Patient denied dysuria, polyuria, and pelvic pain. - Patient s/p 1 g Rocephin - We will continue to monitor, low suspicion for ongoing process, can consider further antibiotics as indicated

## 2023-12-27 NOTE — Assessment & Plan Note (Addendum)
 A1c 7.1% as of 01/25.  She is on Lantus, metformin, Farxiga, and dulaglutide at home. - Start resistant SSI and add back home regimen as appropriate - Hold home metformin 1000 mg twice daily

## 2023-12-27 NOTE — Progress Notes (Addendum)
 Transition of Care [facility]) - Inpatient Brief Assessment   Patient Details  Name: Rachel Nielsen MRN: [id number] Date of Birth: 03-Jul-1951  Transition of Care [facility]) CM/SW Contact:    Janae Bridgeman, RN Phone Number: 12/27/2023, 4:16 PM   Clinical Narrative: CM met with the patient at the bedside to discuss TOC needs.  The patient admitted to the hospital from home after falling in the bathroom.  Patient was provided  with [facility] letter and Code 44 completed at the request of UR RN.  Patient was provided with [facility] document.  Patient is currently active with [facility] for PT, OT.  DME at the home includes [place], [place], and nebulizer.  When patient is stable for discharge, patient will likely need transportation assistance to home since son is likely unable to provide transportation.  Patient is currently pending PT/OT evaluation.   Transition of Care Asessment: Insurance and Status: (P) Insurance coverage has been reviewed Patient has primary care physician: (P) Yes Home environment has been reviewed: (P) from home with son Prior level of function:: (P) home health through [facility] home health Prior/Current Home Services: (P) Current home services (Active with [facility] home health) Social Drivers of Health Review: (P) SDOH reviewed needs interventions Readmission risk has been reviewed: (P) Yes Transition of care needs: (P) transition of care needs identified, TOC will continue to follow

## 2023-12-27 NOTE — Assessment & Plan Note (Addendum)
 As observed on imaging and appears stable from prior.  Unlikely contributing to current presentation.  She has been on aspirin and Plavix at home given recommendations for indefinite use by neurology in 05/2022.  She has not been on anticoagulation. -Continue home DAPT and lovenox for VTE ppx, upon last neurology note, patient supposed to continue on indefinitely

## 2023-12-27 NOTE — Care Management CC44 (Cosign Needed)
 Condition Code 44 Documentation Completed  Patient Details  Name: Rachel Nielsen MRN: [id number] Date of Birth: 1951-04-26   Condition Code 44 given:    Patient signature on Condition Code 44 notice:    Documentation of 2 MD's agreement:    Code 44 added to claim:       Janae Bridgeman, RN 12/27/2023, 4:02 PM

## 2023-12-27 NOTE — Hospital Course (Addendum)
 Rachel Nielsen is a 73 y.o.female with a history of prior strokes, microcytic anemia, type 2 diabetes, diabetic neuroapthy, HLD, GERD, HTN, MDD, COPD and CKD who was admitted to the [facility] Medicine Teaching Service at [facility] for bilateral leg weakness and recurrent falls. Her hospital course is detailed below:  Fall Patient admitted for recurrent falls and bilateral leg weakness at home.  She was brought in by EMS and was activated as an acute trauma. CT head/spine were negative for acute process and demonstrated chronic subdural hematomas.  MRI was also reassuring and without acute process.  MRI of lumbar and thoracic spine was negative for spinal stenosis.  Patient's recurrent falls are suspected to be in the setting of polypharmacy while on multiple psychotropic medications including gabapentin, pregabalin, xanax and depakote.  Her home pregabalin and Depakote were held throughout this hospitalization.  She continued on gabapentin 600 mg daily and Xanax 0.5 mg 3 times daily scheduled.  PT and OT evaluated the patient and determined that she could continue her home health services at discharge.  UTI (urinary tract infection) Altered mental status was concerning during initial evaluation by the ED providers. UA showed large leukocytes and patient was given 1 g of Rocephin for treatment of possible urinary tract infection. Patient denied dysuria, polyuria, and pelvic pain. Patient remained asymptomatic and further treatment with antibiotics was deferred.  Microcytic anemia Patient's admission was Hgb 11.1.  There were no overt signs of bleeding, hematemesis or melena reported prior or during admission.  Iron labs showed normal iron, TIBC high, low saturations and low normal ferritin.  Her CBC was monitored throughout admission.  T2DM (type 2 diabetes mellitus) (HCC) Patient's most recent A1c 7.1% as of 01/25.  Her home regimen includes Lantus, metformin, Farxiga, and dulaglutide.  Glucoses were monitored  and she was started on resistant SSI.  She gradually restarted her home [place] prior to discharge.  Remainder of home medications were held.  Chronic subdural hematoma (HCC) As observed on imaging and appears stable from prior.  Unlikely contributing to current presentation.  She has been on aspirin and Plavix at home given recommendations for indefinite use by neurology in 05/2022.  She continued on dual antiplatelet therapy during admission.  Other chronic conditions were medically managed with home medications and formulary alternatives as necessary (***)  PCP Follow-up Recommendations: Consider discontinuing plavix as patient has a now remote history of stroke  Consider outpatient anemia evaluation

## 2023-12-27 NOTE — Assessment & Plan Note (Addendum)
 UA showed large leukocytes, few bacteria, negative for nitrites.  AMS on admission. Continues to improve and patient oriented x3.  Patient denies any dysuria, polyuria, and pelvic pain. Patient afebrile and WBC stable at 8.7.  - Patient s/p 1 g Rocephin - CTM low suspicion for ongoing process, will defer antibiotics

## 2023-12-27 NOTE — Assessment & Plan Note (Addendum)
 Likely multifactorial given polypharmacy of multiple central acting agents prior to admission, deconditioning and lumbar pathology noted on recent MRI. Reassuringly without acute trauma on exam or CT head/spine. MRI brain without acute intracranial process and bilateral chronic subdural hematomas.MR Lumbar spine with mild left L5-S1 foraminal stenosis and moderate L4-L5 and L5-S1 face arthrosis. Negative for spinal canal stenosis.  - AM BMP/CBC -PT/OT to evaluate -Will need further titration of medications to reduce effects of polypharmacy prior to discharge, she sees Dr. Malvin Johns for this already though difficult to ascertain her understanding of medications

## 2023-12-28 ENCOUNTER — Other Ambulatory Visit (HOSPITAL_COMMUNITY): Payer: Self-pay

## 2023-12-28 DIAGNOSIS — M47816 Spondylosis without myelopathy or radiculopathy, lumbar region: Secondary | ICD-10-CM

## 2023-12-28 DIAGNOSIS — I6203 Nontraumatic chronic subdural hemorrhage: Secondary | ICD-10-CM | POA: Diagnosis not present

## 2023-12-28 DIAGNOSIS — S0990XA Unspecified injury of head, initial encounter: Secondary | ICD-10-CM | POA: Diagnosis not present

## 2023-12-28 DIAGNOSIS — D500 Iron deficiency anemia secondary to blood loss (chronic): Secondary | ICD-10-CM

## 2023-12-28 DIAGNOSIS — R2689 Other abnormalities of gait and mobility: Secondary | ICD-10-CM | POA: Diagnosis not present

## 2023-12-28 LAB — CBC
HCT: 37 % (ref 36.0–46.0)
Hemoglobin: 10.7 g/dL — ABNORMAL LOW (ref 12.0–15.0)
MCH: 22.1 pg — ABNORMAL LOW (ref 26.0–34.0)
MCHC: 28.9 g/dL — ABNORMAL LOW (ref 30.0–36.0)
MCV: 76.4 fL — ABNORMAL LOW (ref 80.0–100.0)
Platelets: 177 10*3/uL (ref 150–400)
RBC: 4.84 MIL/uL (ref 3.87–5.11)
RDW: 18.8 % — ABNORMAL HIGH (ref 11.5–15.5)
WBC: 8.4 10*3/uL (ref 4.0–10.5)
nRBC: 0 % (ref 0.0–0.2)

## 2023-12-28 LAB — BASIC METABOLIC PANEL
Anion gap: 8 (ref 5–15)
BUN: 12 mg/dL (ref 8–23)
CO2: 25 mmol/L (ref 22–32)
Calcium: 8.8 mg/dL — ABNORMAL LOW (ref 8.9–10.3)
Chloride: 106 mmol/L (ref 98–111)
Creatinine, Ser: 0.9 mg/dL (ref 0.44–1.00)
GFR, Estimated: >60 mL/min (ref >60)
Glucose, Bld: 122 mg/dL — ABNORMAL HIGH (ref 70–99)
Potassium: 3.5 mmol/L (ref 3.5–5.1)
Sodium: 139 mmol/L (ref 135–145)

## 2023-12-28 LAB — GLUCOSE, CAPILLARY: Glucose-Capillary: 115 mg/dL — ABNORMAL HIGH (ref 70–99)

## 2023-12-28 MED ORDER — GABAPENTIN 300 MG PO CAPS
600.0000 mg | ORAL_CAPSULE | Freq: Every day | ORAL | 0 refills | Status: DC
  Filled 2023-12-28: qty 60, 30d supply, fill #0

## 2023-12-28 MED ORDER — LANTUS SOLOSTAR 100 UNIT/ML ~~LOC~~ SOPN: 10.0000 [IU] | PEN_INJECTOR | Freq: Every day | SUBCUTANEOUS | Status: DC

## 2023-12-28 MED ORDER — FERROUS SULFATE 325 (65 FE) MG PO TABS
325.0000 mg | ORAL_TABLET | ORAL | 1 refills | Status: AC
Start: 2023-12-28 — End: ?
  Filled 2023-12-28: qty 30, 60d supply, fill #0

## 2023-12-28 MED ORDER — FERROUS SULFATE 325 (65 FE) MG PO TABS: 325.0000 mg | ORAL_TABLET | ORAL | Status: DC

## 2023-12-28 NOTE — Assessment & Plan Note (Deleted)
 A1c 7.1% as of 01/25.  She is on Lantus, metformin, Farxiga, and dulaglutide at home. Blood sugars 90s-150s.  - Continue resistant SSI  -  Continue Farxiga  - Hold home metformin 1000 mg twice daily

## 2023-12-28 NOTE — Discharge Summary (Cosign Needed Addendum)
 Family Medicine Teaching [facility] Discharge Summary  Patient name: Rachel Nielsen Medical record number: [id number] Date of birth: July 11, 1951 Age: 73 y.o. Gender: female Date of Admission: 12/26/2023  Date of Discharge: 12/28/2023 Admitting Physician: Tomie China, MD  Primary Care Provider: Morene Crocker, MD Consultants: PT/OT   Indication for Hospitalization: Falls   Discharge Diagnoses/Problem List:  Principal Problem for Admission: Fall  Other Problems addressed during stay:  Principal Problem:   Fall Active Problems:   Obesity, Class III, BMI 40-49.9 (morbid obesity) (HCC)   Abnormality of gait due to impairment of balance   T2DM (type 2 diabetes mellitus) (HCC)   Iron deficiency anemia   Chronic subdural hematoma (HCC)   Leg weakness, bilateral   Osteoarthritis of facet joint of lumbar spine   Chronic bilateral low back pain without sciatica   Polypharmacy   Glaucoma   Onychauxis  Brief Hospital Course:  Rachel Nielsen is a 73 y.o.female with a history of prior strokes, microcytic anemia, type 2 diabetes, diabetic neuroapthy, HLD, GERD, HTN, MDD, COPD and CKD who was admitted to the [facility] Medicine Teaching Service at [facility] for bilateral leg weakness and recurrent falls. Her hospital course is detailed below:  Fall Admitted for recurrent falls and bilateral leg weakness at home. CT head/spine were negative for acute process and demonstrated chronic subdural hematomas.  MRI brain and spine was also reassuring and without acute process. Patient's recurrent falls are suspected to be in the setting of polypharmacy while on multiple psychotropic medications including gabapentin, pregabalin, xanax and depakote.  Her home Pregabalin and Depakote were discontinued.  She continued on reduced dose gabapentin 600 mg daily and Xanax 0.5 mg 3 times daily scheduled.  PT/OT recommended HH services.  UTI (urinary tract infection) Altered mental status was concerning during  initial evaluation by the ED providers. UA showed large leukocytes and patient was given 1 g of Rocephin for treatment of possible urinary tract infection. Patient denied dysuria, polyuria, and pelvic pain. Patient remained asymptomatic and further treatment with antibiotics was deferred.  Iron Deficiency Anemia Hgb 11.1 (from normal) upon admission.  There were no overt signs of bleeding, hematemesis or melena reported prior or during admission.  Iron labs showed normal iron, TIBC high, low saturations and normal ferritin and concerning for Iron deficiency anemia. She was started on 325 mg ferrous sulfate every other day.  Recommend outpatient GI workup.  T2DM (type 2 diabetes mellitus) (HCC) Recent A1c 7.1% as of 01/25.  CBGs were monitored and discharged on Jardiance and reduced dose Lantus 10 units daily.  Chronic subdural hematoma (HCC) As observed on imaging and appears stable from prior.  Unlikely contributing to current presentation.  Continued on Aspirin and Plavix at home given recommendations for indefinite use by neurology in 05/2022.   PCP Follow-up Recommendations: Consider only using a single agent (ASA or Plavix) as patient has a now remote history of stroke, has iron deficiency anemia and history of tubular adenoma. Defer to neurologist.  Recommend follow-up with GI for consideration of colonoscopy. Prior in 2019 concerning for tubular adenoma. They recommended repeat colonoscopy in 1 year and appears it was canceled on 11/23/2019 Consider tapering off Nortriptyline, discuss with psychiatric provider  Discharge on Lantus 10 units daily, can titrate up as indicated   Disposition: Home  Discharge Condition: Stable   Issues for Follow Up: Refer to PCP follow-up recs above  Discharge Exam:  Vitals:   12/28/23 0445 12/28/23 0823  BP: (!) 143/86 136/87  Pulse: 84 84  Resp: 17 17  Temp: 97.7 F (36.5 C) 98.2 F (36.8 C)  SpO2: 98% 98%   General: No acute distress, resting  comfortably in bed Cardiovascular: Regular rate and rhythm, no murmurs appreciated Respiratory: Normal work of breathing, CTAB and anterior lung fields no wheezing appreciated Abdomen: Soft, nontender, nondistended and normoactive bowel sound Neuro: Alert and Oriented x3, mild right sided weakness > L from residual prior strokes, full ROM in all extremities   Significant Procedures: None  Significant Labs and Imaging:   Ferritin 11 Iron 47 TIBC 456 Saturation Ratios 10  Recent Labs  Lab 12/26/23 1913 12/27/23 0442 12/28/23 0614  WBC 8.2 8.7 8.4  HGB 11.1* 10.5* 10.7*  HCT 38.7 36.2 37.0  PLT 167 170 177   Recent Labs  Lab 12/26/23 1913 12/27/23 0442 12/27/23 1050 12/27/23 1537 12/28/23 0614  NA 140 141 ACCURACY OF RESULTS QUESTIONABLE. RECOMMEND RECOLLECT TO VERIFY. 143 139  K 3.9 3.4* ACCURACY OF RESULTS QUESTIONABLE. RECOMMEND RECOLLECT TO VERIFY. 4.4 3.5  CL 104 105 ACCURACY OF RESULTS QUESTIONABLE. RECOMMEND RECOLLECT TO VERIFY. 107 106  CO2 25 26 ACCURACY OF RESULTS QUESTIONABLE. RECOMMEND RECOLLECT TO VERIFY. 25 25  GLUCOSE 128* 99 ACCURACY OF RESULTS QUESTIONABLE. RECOMMEND RECOLLECT TO VERIFY. 109* 122*  BUN 16 11 ACCURACY OF RESULTS QUESTIONABLE. RECOMMEND RECOLLECT TO VERIFY. 11 12  CREATININE 0.84 0.71 ACCURACY OF RESULTS QUESTIONABLE. RECOMMEND RECOLLECT TO VERIFY. 0.89 0.90  CALCIUM 9.0 8.7* ACCURACY OF RESULTS QUESTIONABLE. RECOMMEND RECOLLECT TO VERIFY. 8.8* 8.8*  MG 1.9  --   --   --   --   ALKPHOS 63  --   --   --   --   AST 16  --   --   --   --   ALT 11  --   --   --   --   ALBUMIN 3.1*  --   --   --   --    Pertinent Imaging     CT Head and CT Cervical spine w/o contrast:  IMPRESSION:  1. No acute intracranial CT findings or depressed skull fractures. 2. Stable 4 mm in thickness left frontal convexity small chronic subdural hematoma. 3. Atrophy and small-vessel disease. 4. Chronic proptosis. 5. Straightened and slightly reversed cervical  lordosis with degenerative grade 1 anterolisthesis C4-5. No traumatic or further listhesis. No evidence of cervical fracture. 6. Degenerative changes with multilevel foraminal stenosis.   MRI:  IMPRESSION:  1. No acute intracranial abnormality. 2. Trace bilateral chronic subdural hematomas. 3. Findings of chronic small vessel ischemia.   MR Thoracic spine:  Normal MRI of the thoracic spine.    MR Lumbar spine:  IMPRESSION:  1. Unchanged mild left L5-S1 neural foraminal stenosis. 2. Moderate L4-L5 and L5-S1 facet arthrosis. 3. No spinal canal stenosis.   Results/Tests Pending at Time of Discharge: None   Discharge Medications:  Allergies as of 12/28/2023       Reactions   Ace Inhibitors Cough        Medication List     STOP taking these medications    divalproex 250 MG DR tablet Commonly known as: DEPAKOTE   gabapentin 600 MG tablet Commonly known as: NEURONTIN Replaced by: gabapentin 300 MG capsule   pregabalin 75 MG capsule Commonly known as: LYRICA       TAKE these medications    albuterol 108 (90 Base) MCG/ACT inhaler Commonly known as: VENTOLIN HFA Inhale 2-4 puffs by mouth every 4 hours as needed  for wheezing, cough, and/or shortness of breath What changed:  how much to take when to take this reasons to take this additional instructions   albuterol (2.5 MG/3ML) 0.083% nebulizer solution Commonly known as: PROVENTIL Inhale 3 mLs (2.5 mg total) into the lungs every 4 (four) hours as needed for wheezing or shortness of breath. What changed: when to take this   ALPRAZolam 1 MG tablet Commonly known as: XANAX Take 1 mg by mouth 3 (three) times daily as needed for anxiety or sleep.   amLODipine 5 MG tablet Commonly known as: NORVASC Take 1 tablet (5 mg total) by mouth daily.   Asenapine Maleate 10 MG Subl Place 10 mg under the tongue at bedtime.   aspirin EC 81 MG tablet Take 1 tablet (81 mg total) by mouth daily. Swallow whole.    bisoprolol 10 MG tablet Commonly known as: ZEBETA Take 1 tablet (10 mg total) by mouth daily. OFFICE VISIT NEEDED FOR ADDITIONAL REFILLS   brimonidine 0.2 % ophthalmic solution Commonly known as: ALPHAGAN 1 drop 3 (three) times daily.   buPROPion 300 MG 24 hr tablet Commonly known as: WELLBUTRIN XL Take 1 tablet (300 mg total) by mouth daily. What changed: when to take this   busPIRone 15 MG tablet Commonly known as: BUSPAR Take 15 mg by mouth daily.   clopidogrel 75 MG tablet Commonly known as: PLAVIX Take 75 mg by mouth daily.   dorzolamide-timolol 2-0.5 % ophthalmic solution Commonly known as: COSOPT Place 1 drop into both eyes 2 (two) times daily.   Dulaglutide 3 MG/0.5ML Soaj Inject 0.5 mLs (3 mg total) subcutaneously once a week   famotidine 20 MG tablet Commonly known as: PEPCID Take 20 mg by mouth daily.   Farxiga 10 MG Tabs tablet Generic drug: dapagliflozin propanediol Take 10 mg by mouth daily.   FeroSul 325 (65 Fe) MG tablet Generic drug: ferrous sulfate Take 1 tablet (325 mg total) by mouth every other day.   fluticasone 50 MCG/ACT nasal spray Commonly known as: FLONASE Place 1 spray into both nostrils daily.   fluticasone-salmeterol 250-50 MCG/ACT Aepb Commonly known as: ADVAIR Inhale 1 puff into the lungs 2 (two) times daily.   gabapentin 300 MG capsule Commonly known as: NEURONTIN Take 2 capsules (600 mg total) by mouth daily. Replaces: gabapentin 600 MG tablet   Lantus SoloStar 100 UNIT/ML Solostar Pen Generic drug: insulin glargine Inject 10 Units into the skin at bedtime. What changed: how much to take   Linzess 290 MCG Caps capsule Generic drug: linaclotide Take 1 capsule (290 mcg total) by mouth daily before breakfast.   metFORMIN 1000 MG tablet Commonly known as: GLUCOPHAGE Take 1,000 mg by mouth 2 (two) times daily with a meal.   montelukast 10 MG tablet Commonly known as: SINGULAIR Take 1 tablet (10 mg total) by mouth  daily. What changed: when to take this   Myrbetriq 25 MG Tb24 tablet Generic drug: mirabegron ER Take 25 mg by mouth at bedtime.   nortriptyline 10 MG capsule Commonly known as: PAMELOR Take 10 mg by mouth at bedtime.   pantoprazole 40 MG tablet Commonly known as: PROTONIX Take 1 tablet (40 mg total) by mouth 2 (two) times daily.   rosuvastatin 10 MG tablet Commonly known as: CRESTOR Take 1 tablet (10 mg total) by mouth daily.   terconazole 0.4 % vaginal cream Commonly known as: TERAZOL 7 Place 1 applicator vaginally at bedtime.   Travatan Z 0.004 % Soln ophthalmic solution Generic drug: Travoprost (BAK  Free) Place 1 drop into both eyes at bedtime.        Discharge Instructions: Please refer to Patient Instructions section of EMR for full details.  Patient was counseled important signs and symptoms that should prompt return to medical care, changes in medications, dietary instructions, activity restrictions, and follow up appointments.   Follow-Up Appointments:  Follow-up Information     [facility] Follow up.   Specialty: Home Health Services Contact information: [address] [place] [address] [place] [address] [phone]         Morene Crocker, MD. Call.   Specialty: Neurology Why: See if you can get a sooner appointment, currently scheduled in April 1st Contact information: [address] [place] [address] [phone]         Alm Bustard, NP. Call.   Specialty: Family Medicine Why: Request hospital follow-up appointment as soon as possible, Contact information: [address] [place] [address] [phone]                 Peterson Ao, MD 12/28/2023, 11:33 AM PGY-1, [facility]  Upper Level Addendum:   I have reviewed the above note, making necessary revisions as appropriate.  I agree with the medical decision making and physical exam as noted above.   Elberta Fortis, DO PGY-2, [facility] Family Medicine Residency

## 2023-12-28 NOTE — Evaluation (Signed)
 Occupational Therapy Evaluation Patient Details Name: Rachel Nielsen MRN: [id number] DOB: Nov 23, 1950 Today's Date: 12/28/2023   History of Present Illness   Rachel Nielsen. Whisner is a 73 y.o. female who presented 2/23 with with bilateral leg weakness and multiple recent falls. Likely multifactorial given polypharmacy of multiple central acting agents prior to admission, MRI reveals deconditioning and lumbar pathology. MR Lumbar spine with mild left L5-S1 foraminal stenosis and moderate L4-L5 and L5-S1 face arthrosis. +UTI.  PMHx of COPD, history of CVA, HTN, Hypokalemia, GERD, glaucoma, heart attack, asthma, and anxiety     Clinical Impressions Pt evaluated s/p the admission list above. At baseline, pt lives at home with son, has a PCA 2xs a week to assist with ADLs, and MOD I with functional mobility. Pt reports she is able to complete self care when aide isn't available with increased time. Pt also reports working with HHPT prior to admission. Upon evaluation, pt was limited by generalized weakness in BLE. Overall, pt completed functional mobility tasks with up to CGA for safety. Pt required verbal cues for B hand placement when perform STS for improved safety and independence. Pt completed grooming task while standing at sink with CGA. Pt tolerated standing ~5 minutes to complete task. No LOB observed. Based on evaluation, anticipates pt will required up to [facility] for safety while standing to complete ADLs. OT to follow pt acutely with d/c recommendation of HHOT to maximize functional independence and to ensure safe discharge to home environment.      If plan is discharge home, recommend the following:   A little help with walking and/or transfers;A little help with bathing/dressing/bathroom;Assistance with cooking/housework;Assist for transportation;Help with stairs or ramp for entrance     Functional Status Assessment   Patient has had a recent decline in their functional status and demonstrates  the ability to make significant improvements in function in a reasonable and predictable amount of time.     Equipment Recommendations   None recommended by OT     Recommendations for Other Services         Precautions/Restrictions   Precautions Precautions: Fall Restrictions Weight Bearing Restrictions Per Provider Order: No     Mobility Bed Mobility Overal bed mobility: Needs Assistance Bed Mobility: Supine to Sit, Sit to Supine     Supine to sit: Contact guard Sit to supine: Contact guard assist   General bed mobility comments: CGA for safety, some difficulty rise to EOB. No problem getting back into bed. Cues for technique. leaning backwards inititally.    Transfers Overall transfer level: Needs assistance Equipment used: Rolling walker (2 wheels) Transfers: Sit to/from Stand Sit to Stand: Contact guard assist           General transfer comment: CGA for safety. Verbal cues provided for B hand placement      Balance Overall balance assessment: Needs assistance Sitting-balance support: No upper extremity supported, Feet supported Sitting balance-Leahy Scale: Fair Sitting balance - Comments: Improved when feet on floor, initially leaning backwards. Postural control: Posterior lean Standing balance support: During functional activity, No upper extremity supported, Single extremity supported Standing balance-Leahy Scale: Fair Standing balance comment: completed grooming task standing at sink                           ADL either performed or assessed with clinical judgement   ADL Overall ADL's : Needs assistance/impaired Eating/Feeding: Independent   Grooming: Contact guard assist;Standing;Wash/dry hands;Wash/dry face;Oral care  Upper Body Bathing: Supervision/ safety;Sitting   Lower Body Bathing: Contact guard assist;Sitting/lateral leans;Sit to/from stand   Upper Body Dressing : Supervision/safety;Sitting   Lower Body Dressing:  Contact guard assist;Sit to/from stand;Sitting/lateral leans   Toilet Transfer: Contact guard assist;Ambulation;Regular Toilet;Rolling walker (2 wheels)   Toileting- Clothing Manipulation and Hygiene: Contact guard assist;Sit to/from stand;Sitting/lateral lean       Functional mobility during ADLs: Contact guard assist;Rolling walker (2 wheels) General ADL Comments: CGA provided for safety when standing and performing functional mobility due to generalized weakness in BLE. Verbal cues provided for B hand placement when performing STS.     Vision Baseline Vision/History: 3 Glaucoma Ability to See in Adequate Light: 0 Adequate Patient Visual Report: No change from baseline Vision Assessment?: No apparent visual deficits     Perception Perception: Within Functional Limits       Praxis Praxis: Not tested       Pertinent Vitals/Pain Pain Assessment Pain Assessment: No/denies pain     Extremity/Trunk Assessment Upper Extremity Assessment Upper Extremity Assessment: Overall WFL for tasks assessed   Lower Extremity Assessment Lower Extremity Assessment: Defer to PT evaluation       Communication Communication Communication: No apparent difficulties   Cognition Arousal: Alert Behavior During Therapy: WFL for tasks assessed/performed Cognition: No apparent impairments             OT - Cognition Comments: Pt answered questions concerning PLOF appropriately. Followed multi-step commands consistently. Able to express needs.                 Following commands: Intact       Cueing  General Comments   Cueing Techniques: Verbal cues      Exercises     Shoulder Instructions      Home Living Family/patient expects to be discharged to:: Private residence Living Arrangements: Children Available Help at Discharge: Family;Available 24 hours/day Type of Home: House Home Access: Stairs to enter [employer] of Steps: Small threshold at back  entrance Entrance Stairs-Rails: None Home Layout: One level     Bathroom Shower/Tub: Chief Strategy Officer: Standard     Home Equipment: Rollator (4 wheels);Cane - single point;Grab bars - tub/shower;Rolling Walker (2 wheels);Tub bench   Additional Comments: Aide 2x/week (for 3 hours each time)      Prior Functioning/Environment Prior Level of Function : History of Falls (last six months);Needs assist       Physical Assist : ADLs (physical)   ADLs (physical): Bathing;Dressing Mobility Comments: Uses SPC when going out to car (d/t walker not fitting through back door).  Uses 4ww for household mobility (uses w/c when goes to medical appts).  H/o falls in last 6 months. ADLs Comments: Aide provides assistance with bathing and dressing 2xs per week. Can complete ADLs MOD I with increased time when aide isn't present    OT Problem List: Decreased strength;Decreased activity tolerance;Impaired balance (sitting and/or standing)   OT Treatment/Interventions: Self-care/ADL training;Therapeutic exercise;Energy conservation;DME and/or AE instruction;Therapeutic activities;Patient/family education;Balance training      OT Goals(Current goals can be found in the care plan section)   Acute Rehab OT Goals Patient Stated Goal: home OT Goal Formulation: With patient Time For Goal Achievement: 01/11/24 Potential to Achieve Goals: Good ADL Goals Additional ADL Goal #1: Pt will complete all ADLs with MOD I.   OT Frequency:  Min 1X/week    Co-evaluation              AM-PAC OT "6  Clicks" Daily Activity     Outcome Measure Help from another person eating meals?: None Help from another person taking care of personal grooming?: A Little Help from another person toileting, which includes using toliet, bedpan, or urinal?: A Little Help from another person bathing (including washing, rinsing, drying)?: A Little Help from another person to put on and taking off regular upper  body clothing?: A Little Help from another person to put on and taking off regular lower body clothing?: A Little 6 Click Score: 19   End of Session Equipment Utilized During Treatment: Gait belt;Rolling walker (2 wheels) Nurse Communication: Mobility status  Activity Tolerance: Patient tolerated treatment well Patient left: in bed;with call bell/phone within reach;with bed alarm set  OT Visit Diagnosis: Unsteadiness on feet (R26.81);Other abnormalities of gait and mobility (R26.89);Repeated falls (R29.6);Muscle weakness (generalized) (M62.81)                Time: 1914-7829 OT Time Calculation (min): 20 min Charges:  OT General Charges $OT Visit: 1 Visit OT Evaluation $OT Eval Low Complexity: 1 Low [address] [place]  Azariyah Luhrs 12/28/2023, 10:19 AM

## 2023-12-28 NOTE — Plan of Care (Signed)
Pt d/c

## 2023-12-28 NOTE — Plan of Care (Signed)

## 2023-12-28 NOTE — Assessment & Plan Note (Deleted)
 Hgb 11.1 on admission >>> 10.7 this morning. Appears chronic and stable.  No reports of bleeding. - TIBC High, Saturation low, ferritin normal, Iron normal concerning for iron deficiency anemia  - Iron 325 mg every other day  - AM CBC if stays

## 2023-12-28 NOTE — Assessment & Plan Note (Deleted)
 Likely multifactorial given polypharmacy of multiple central acting agents prior to admission, deconditioning and lumbar pathology noted on recent MRI. Reassuringly without acute trauma on exam or CT head/spine. MRI brain without acute intracranial process and bilateral chronic subdural hematomas.MR Lumbar spine with mild left L5-S1 foraminal stenosis and moderate L4-L5 and L5-S1 face arthrosis. Negative for spinal canal stenosis.  - AM BMP stable  - PT/OT recommended HH at discharge  - Will need further titration of medications to reduce effects of polypharmacy prior to discharge, she sees Dr. Malvin Johns for this already though difficult to ascertain her understanding of medications.  - Follow-up appointments:

## 2023-12-28 NOTE — TOC Transition Note (Signed)
 Transition of Care [facility]) - Discharge Note   Patient Details  Name: Rachel Nielsen MRN: [id number] Date of Birth: 12-17-1950  Transition of [facility] CM/SW Contact:  Janae Bridgeman, RN Phone Number: 12/28/2023, 10:31 AM   Clinical Narrative:    CM met with the patient at the bedside to discuss TOC needs to return home.  The patient states that her daughter is driving from [place] to come and pick her up from the hospital.  Patient states that the daughter plans to provide transportation to home in the next 2 hours.  Charge RN is aware that patient has ride home and may be good patient for discharge lounge while waiting for her ride home.         Patient Goals and CMS Choice            Discharge Placement                       Discharge Plan and Services Additional resources added to the After Visit Summary for                                       Social Drivers of Health (SDOH) Interventions SDOH Screenings   Food Insecurity: No Food Insecurity (12/27/2023)  Housing: High Risk (12/27/2023)  Transportation Needs: No Transportation Needs (12/27/2023)  Utilities: Not At Risk (12/27/2023)  Alcohol Screen: Low Risk  (12/25/2022)  Depression (PHQ2-9): High Risk (02/12/2023)  Financial Resource Strain: Low Risk  (12/06/2023)   Received from [facility]  Physical Activity: Inactive (12/25/2022)  Social Connections: Socially Isolated (12/27/2023)  Stress: No Stress Concern Present (12/25/2022)  Tobacco Use: Medium Risk (12/27/2023)     Readmission Risk Interventions    12/27/2023    4:14 PM  Readmission Risk Prevention Plan  Transportation Screening Complete  PCP or Specialist Appt within 5-7 Days Complete  Home Care Screening Complete  Medication Review (RN CM) Complete

## 2023-12-28 NOTE — Assessment & Plan Note (Deleted)
 As observed on imaging and appears stable from prior.  Unlikely contributing to current presentation.  She has been on aspirin and Plavix at home given recommendations for indefinite use by neurology in 05/2022.  She has not been on anticoagulation. -Continue home DAPT and lovenox for VTE ppx, upon last neurology note, patient supposed to continue on indefinitely

## 2024-03-02 NOTE — Progress Notes (Signed)
 Rachel Nielsen is a 73 y.o. female seen in follow up. She no-showed or cancelled her appointment same day on 12/16/23 and 12/30/23. The visit was conducted as a virtual telehealth visit today and she is accompanied by her son, Rachel Nielsen, who helps to manage her medications. Of note, the quality of the internet connection was poor so it was very difficult to conduct the video visit without significant interruptions.    She was last seen on 08/09/2023. Since then, she has been dealing with recurrent falls (was admitted in 12/2023 for this and was suspected to have AMS and weakness due to polypharmacy with multiple psychotropic medications, which has since improved since reducing these). She denies any recent hypoglycemia but states she has not been able to get refills of her sensors through [facility] and only has 3 left before she is completely out.    Type 2 diabetes - Her last Hb A1c on record was 7.2% in 09/2023. She has a Libre 2 CGM and uses a Psychiatric nurse. They were unable to bring in her receiver to the office for download, as requested when the nurse spoke with them  over the phone to schedule this appointment. There are no recent blood sugars available for review. She denies any recent hypoglycemic episodes and her son confirms this. Her prescribed diabetes medications currently include: Trulicity  3 mg weekly, Lantus  22 units QHS (although admits to taking this only when blood sugars are very high), Farxiga  10 mg daily (or Jardiance 25 mg daily, she is unsure which she is taking now), and Metformin  1000 mg BID.   Diabetes is complicated by vascular disease and peripheral neuropathy. She follows with Dr. Ashley (podiatry) for diabetic foot care. She is overdue for an eye exam; last was on 08/25/2022 at [facility].  Hyperlipidemia - She does take a statin.  Depression - Managed by Dr Rachel Nielsen, psychiatry.   Past Medical History:  Diagnosis Date  . Asthma without status asthmaticus (HHS-HCC)   .  COPD (chronic obstructive pulmonary disease) (CMS/HHS-HCC)   . CVA (cerebral vascular accident) (CMS/HHS-HCC)   . Depression    follows with Dr. Daniel (Psychiatrist at [facility])  . Diabetes mellitus type 2 (CMS-HCC)   . Hypertension   . Sleep apnea     Outpatient Medications Marked as Taking for the 03/02/24 encounter (Telemedicine) with Rachel Calton Squires, PA  Medication Sig Dispense Refill  . albuterol  MDI, PROVENTIL , VENTOLIN , PROAIR , HFA 90 mcg/actuation inhaler Inhale 2 inhalations into the lungs every 6 (six) hours as needed for Wheezing 18 g 4  . ALPRAZolam  (XANAX ) 0.5 MG tablet Take 1 tablet by mouth 3 (three) times daily as needed for Anxiety Taking 2-3 daily    . amLODIPine  (NORVASC ) 5 MG tablet Take 1 tablet (5 mg total) by mouth once daily 90 tablet 1  . asenapine  maleate (SAPHRIS ) 10 mg Subl SL tablet at bedtime    . aspirin  81 MG EC tablet Take 81 mg by mouth once daily    . bisoprolol  (ZEBETA ) 10 MG tablet Take 1 tablet (10 mg total) by mouth once daily 90 tablet 1  . buPROPion  (WELLBUTRIN  XL) 300 MG XL tablet Take 1 tablet by mouth once daily    . busPIRone  (BUSPAR ) 15 MG tablet Take 1 tablet by mouth once daily    . clopidogreL  (PLAVIX ) 75 mg tablet Take 1 tablet (75 mg total) by mouth once daily 30 tablet 2  . dapagliflozin  propanediol (FARXIGA ) 10 mg tablet Take 1  tablet (10 mg total) by mouth once daily 30 tablet 0  . divalproex  (DEPAKOTE ) 250 MG DR tablet Take 1 tablet (250 mg total) by mouth 2 (two) times daily (Patient taking differently: Take 250 mg by mouth 2 (two) times daily Taking half a tablet once a day.) 60 tablet 5  . dorzolamide -timoloL  (COSOPT ) 22.3-6.8 mg/mL ophthalmic solution     . dulaglutide  (TRULICITY ) 3 mg/0.5 mL subcutaneous pen injector Inject 0.5 mLs (3 mg total) subcutaneously every 7 (seven) days 6 mL 0  . DULoxetine  (CYMBALTA ) 60 MG DR capsule Take 25 mg by mouth once daily Weaning off    . empagliflozin (JARDIANCE) 25 mg  tablet Take 1 tablet (25 mg total) by mouth once daily 30 tablet 11  . famotidine  (PEPCID ) 20 MG tablet Take 1 tablet (20 mg total) by mouth 2 (two) times daily 120 tablet 0  . flash glucose scanning (FREESTYLE LIBRE 2 READER) reader Use 1 Device as directed 1 each 1  . flash glucose sensor (FREESTYLE LIBRE 2 SENSOR) kit Use 1 kit every 14 (fourteen) days for glucose monitoring 6 kit 3  . fluticasone  propion-salmeteroL (ADVAIR DISKUS) 250-50 mcg/dose diskus inhaler Inhale 1 inhalation into the lungs every 12 (twelve) hours 60 each 3  . fluticasone  propionate (FLONASE ) 50 mcg/actuation nasal spray Place 2 sprays into both nostrils once daily 16 g 2  . gabapentin  (NEURONTIN ) 600 MG tablet 600 mg in the morning, 600 mg in the afternoon and 1200 mg at night. (Patient taking differently: Take 600 mg by mouth once daily) 360 tablet 0  . insulin  GLARGINE (LANTUS  SOLOSTAR U-100 INSULIN ) pen injector (concentration 100 units/mL) Inject 22 Units subcutaneously at bedtime 15 mL 3  . linaCLOtide  (LINZESS ) 290 mcg capsule Take 1 capsule (290 mcg total) by mouth once daily 90 capsule 1  . metFORMIN  (GLUCOPHAGE ) 1000 MG tablet Take 1 tablet (1,000 mg total) by mouth 2 (two) times daily with meals 180 tablet 3  . mirabegron  (MYRBETRIQ ) 25 mg ER Tablet Take 1 tablet (25 mg total) by mouth once daily 90 tablet 1  . montelukast  (SINGULAIR ) 10 mg tablet Take 1 tablet (10 mg total) by mouth nightly 30 tablet 3  . nortriptyline  (PAMELOR ) 10 MG capsule Take 1 capsule (10 mg total) by mouth at bedtime 90 capsule 0  . pantoprazole  (PROTONIX ) 40 MG DR tablet Take 1 tablet by mouth once daily    . PEN NEEDLE 31 gauge x 3/16 needle Use to inject 4 times daily subcutaneously 400 each 1  . predniSONE  (DELTASONE ) 10 MG tablet Prednisone  10 mg, 4 pills q day x 2 days, 3 pills q day x 2 days, 2 pills q day x 2 days, 1 pill q day x 2 days. 20 tablet 0  . rosuvastatin  (CRESTOR ) 10 MG tablet Take 1 tablet by mouth once daily    .  travoprost  (TRAVATAN  Z) 0.004 % Ophth ophthalmic solution as directed      Physical Exam Ht 162.6 cm (5' 4)   Wt (!) 101.6 kg (224 lb)   LMP  (LMP Unknown)   BMI 38.45 kg/m   GEN: well developed female in NAD. PSYC: alert and oriented, good insight.  Unable to perform any additional physical exam due to nature of televised visit.   Labs Hemoglobin A1c - External (09/28/2023) Order: [id number] Component Ref Range & Units 5 mo ago  Hgb A1c MFr Bld 4.8 - 5.6 % 7.2 High    Lab Results  Component Value Date  WBC 8.4 10/08/2023   HGB 11.2 (L) 10/08/2023   HCT 38.3 10/08/2023   PLT 211 10/08/2023   TRIG 60 10/13/2022   HDL 37.9 10/13/2022   ALT 10 10/08/2023   AST 10 10/08/2023   NA 143 10/08/2023   K 3.8 10/08/2023   CL 106 10/08/2023   CREATININE 0.8 10/08/2023   BUN 13 10/08/2023   CO2 27.8 10/08/2023   TSH 1.857 07/12/2023   HGBA1C 7.1 (H) 07/12/2023    Assessment 1. DM type 2 with diabetic peripheral neuropathy (CMS/HHS-HCC)   2. Long-term insulin  use (CMS/HHS-HCC)   3. Type 2 diabetes mellitus with vascular disease (CMS/HHS-HCC)   4. Essential (primary) hypertension   5. Type 2 diabetes mellitus with hyperlipidemia  (CMS/HHS-HCC)     Plan - Counseled her that her diabetes was reasonably controlled based on her last Hb A1c in 09/2023, however there are no recent blood sugars available for review to confirm this. She denies any recent hypoglycemia. I will not make any changes to her regimen, but I stressed the importance that she make sure she is taking either Farxiga  OR Jardiance, not both at the same time. Per her pharmacy history, both medications were filled in the month of March. She states she will have her son review her medications and confirm she is not taking both SGLT-2i.   Son verbalized acknowledgement.  - Continue Trulicity , SGLT-2i, Lantus  and metformin , as prescribed. Refills sent.  Advised about hypoglycemia, what it is, signs/symptoms and  appropriate treatment.  - Continue use of the Libre 2 CGM. Bring receiver to each follow up visit. Will have nurse send a new order with update office visit notes to [facility] so she can continue receiving sensors. Will upgrade her to the 2+ sensors. - She is overdue for another eye exam. - Continue statin.    - Continue follow up with podiatry as planned.  - Return for follow up in-person in 1-2 months, or sooner if needed, with STAT A1c done at the time of her visit.   This video encounter was conducted with the patient's (or proxy's) verbal consent via secure, interactive audio and video telecommunications while in clinic/office/hospital.  The patient (or proxy) was instructed to have this encounter in a suitably private space and to only have persons present to whom they give permission to participate. In addition, patient identity was confirmed by use of name plus an additional identifier.  This visit was coded based on medical decision making (MDM).   Attestation Statement:   I personally performed the service, non-incident to. (WP)   CASSANDRA BUEL COHN, PA

## 2024-04-02 ENCOUNTER — Other Ambulatory Visit: Payer: Self-pay | Admitting: Internal Medicine

## 2024-04-02 DIAGNOSIS — E1165 Type 2 diabetes mellitus with hyperglycemia: Secondary | ICD-10-CM

## 2024-04-03 NOTE — Telephone Encounter (Signed)
 Requested Prescriptions  Pending Prescriptions Disp Refills   rosuvastatin  (CRESTOR ) 10 MG tablet [Pharmacy Med Name: ROSUVASTATIN  CALCIUM  10 MG TAB] 90 tablet 0    Sig: Take 1 tablet (10 mg total) by mouth daily.     Cardiovascular:  Antilipid - Statins 2 Failed - 04/03/2024  4:59 PM      Failed - Valid encounter within last 12 months    Recent Outpatient Visits   None            Failed - Lipid Panel in normal range within the last 12 months    Cholesterol  Date Value Ref Range Status  09/28/2023 86 0 - 200 mg/dL Final   LDL Cholesterol (Calc)  Date Value Ref Range Status  02/12/2023 41 mg/dL (calc) Final    Comment:    Reference range: <100 . Desirable range <100 mg/dL for primary prevention;   <70 mg/dL for patients with CHD or diabetic patients  with > or = 2 CHD risk factors. Aaron Aas LDL-C is now calculated using the Martin-Hopkins  calculation, which is a validated novel method providing  better accuracy than the Friedewald equation in the  estimation of LDL-C.  Melinda Sprawls et al. Erroll Heard. 9604;540(98): 2061-2068  (http://education.[employer].com/faq/FAQ164)    LDL Cholesterol  Date Value Ref Range Status  09/28/2023 35 0 - 99 mg/dL Final    Comment:           Total Cholesterol/HDL:CHD Risk Coronary Heart Disease Risk Table                     Men   Women  1/2 Average Risk   3.4   3.3  Average Risk       5.0   4.4  2 X Average Risk   9.6   7.1  3 X Average Risk  23.4   11.0        Use the calculated Patient Ratio above and the CHD Risk Table to determine the patient's CHD Risk.        ATP III CLASSIFICATION (LDL):  <100     mg/dL   Optimal  119-147  mg/dL   Near or Above                    Optimal  130-159  mg/dL   Borderline  829-562  mg/dL   High  >130     mg/dL   Very High Performed at [facility], [address] [place], [place] [address]    HDL  Date Value Ref Range Status  09/28/2023 42 >40 mg/dL Final   Triglycerides  Date Value  Ref Range Status  09/28/2023 43 <150 mg/dL Final         Passed - Cr in normal range and within 360 days    Creat  Date Value Ref Range Status  02/12/2023 0.90 0.50 - 1.05 mg/dL Final   Creatinine, Ser  Date Value Ref Range Status  12/28/2023 0.90 0.44 - 1.00 mg/dL Final   Creatinine, Urine  Date Value Ref Range Status  02/12/2023 148 20 - 275 mg/dL Final         Passed - Patient is not pregnant

## 2024-05-06 ENCOUNTER — Observation Stay
Admission: EM | Admit: 2024-05-06 | Discharge: 2024-05-08 | Disposition: A | Discharge status: Home Health | Attending: Internal Medicine | Admitting: Internal Medicine

## 2024-05-06 ENCOUNTER — Other Ambulatory Visit: Payer: Self-pay

## 2024-05-06 ENCOUNTER — Emergency Department

## 2024-05-06 DIAGNOSIS — R4182 Altered mental status, unspecified: Principal | ICD-10-CM

## 2024-05-06 DIAGNOSIS — Z79899 Other long term (current) drug therapy: Secondary | ICD-10-CM | POA: Diagnosis not present

## 2024-05-06 DIAGNOSIS — N182 Chronic kidney disease, stage 2 (mild): Secondary | ICD-10-CM

## 2024-05-06 DIAGNOSIS — G9341 Metabolic encephalopathy: Principal | ICD-10-CM | POA: Diagnosis present

## 2024-05-06 DIAGNOSIS — F419 Anxiety disorder, unspecified: Secondary | ICD-10-CM | POA: Diagnosis present

## 2024-05-06 DIAGNOSIS — F32A Depression, unspecified: Secondary | ICD-10-CM | POA: Diagnosis not present

## 2024-05-06 DIAGNOSIS — D649 Anemia, unspecified: Secondary | ICD-10-CM | POA: Diagnosis present

## 2024-05-06 DIAGNOSIS — G4733 Obstructive sleep apnea (adult) (pediatric): Secondary | ICD-10-CM

## 2024-05-06 DIAGNOSIS — E1122 Type 2 diabetes mellitus with diabetic chronic kidney disease: Secondary | ICD-10-CM | POA: Diagnosis not present

## 2024-05-06 DIAGNOSIS — R8271 Bacteriuria: Secondary | ICD-10-CM | POA: Diagnosis not present

## 2024-05-06 DIAGNOSIS — Z6841 Body Mass Index (BMI) 40.0 and over, adult: Secondary | ICD-10-CM | POA: Diagnosis not present

## 2024-05-06 DIAGNOSIS — Z8673 Personal history of transient ischemic attack (TIA), and cerebral infarction without residual deficits: Secondary | ICD-10-CM | POA: Diagnosis not present

## 2024-05-06 DIAGNOSIS — R0602 Shortness of breath: Secondary | ICD-10-CM | POA: Diagnosis present

## 2024-05-06 DIAGNOSIS — E66813 Obesity, class 3: Secondary | ICD-10-CM | POA: Diagnosis present

## 2024-05-06 DIAGNOSIS — Z794 Long term (current) use of insulin: Secondary | ICD-10-CM | POA: Insufficient documentation

## 2024-05-06 DIAGNOSIS — J449 Chronic obstructive pulmonary disease, unspecified: Secondary | ICD-10-CM | POA: Diagnosis not present

## 2024-05-06 DIAGNOSIS — I129 Hypertensive chronic kidney disease with stage 1 through stage 4 chronic kidney disease, or unspecified chronic kidney disease: Secondary | ICD-10-CM | POA: Insufficient documentation

## 2024-05-06 DIAGNOSIS — I10 Essential (primary) hypertension: Secondary | ICD-10-CM | POA: Diagnosis present

## 2024-05-06 DIAGNOSIS — I6203 Nontraumatic chronic subdural hemorrhage: Secondary | ICD-10-CM | POA: Diagnosis present

## 2024-05-06 DIAGNOSIS — Z7982 Long term (current) use of aspirin: Secondary | ICD-10-CM | POA: Insufficient documentation

## 2024-05-06 DIAGNOSIS — J432 Centrilobular emphysema: Secondary | ICD-10-CM | POA: Diagnosis present

## 2024-05-06 DIAGNOSIS — E119 Type 2 diabetes mellitus without complications: Secondary | ICD-10-CM

## 2024-05-06 DIAGNOSIS — N179 Acute kidney failure, unspecified: Secondary | ICD-10-CM | POA: Diagnosis present

## 2024-05-06 DIAGNOSIS — R531 Weakness: Secondary | ICD-10-CM

## 2024-05-06 LAB — CBC WITH DIFFERENTIAL/PLATELET
Abs Immature Granulocytes: 0.05 K/uL (ref 0.00–0.07)
Basophils Absolute: 0 K/uL (ref 0.0–0.1)
Basophils Relative: 0 %
Eosinophils Absolute: 0.3 K/uL (ref 0.0–0.5)
Eosinophils Relative: 3 %
HCT: 38.9 % (ref 36.0–46.0)
Hemoglobin: 11.1 g/dL — ABNORMAL LOW (ref 12.0–15.0)
Immature Granulocytes: 1 %
Lymphocytes Relative: 19 %
Lymphs Abs: 1.8 K/uL (ref 0.7–4.0)
MCH: 23.2 pg — ABNORMAL LOW (ref 26.0–34.0)
MCHC: 28.5 g/dL — ABNORMAL LOW (ref 30.0–36.0)
MCV: 81.4 fL (ref 80.0–100.0)
Monocytes Absolute: 0.6 K/uL (ref 0.1–1.0)
Monocytes Relative: 6 %
Neutro Abs: 6.9 K/uL (ref 1.7–7.7)
Neutrophils Relative %: 71 %
Platelets: 180 K/uL (ref 150–400)
RBC: 4.78 MIL/uL (ref 3.87–5.11)
RDW: 18.3 % — ABNORMAL HIGH (ref 11.5–15.5)
WBC: 9.7 K/uL (ref 4.0–10.5)
nRBC: 0 % (ref 0.0–0.2)

## 2024-05-06 LAB — COMPREHENSIVE METABOLIC PANEL WITH GFR
ALT: 15 U/L (ref 0–44)
AST: 15 U/L (ref 15–41)
Albumin: 3.6 g/dL (ref 3.5–5.0)
Alkaline Phosphatase: 79 U/L (ref 38–126)
Anion gap: 8 (ref 5–15)
BUN: 30 mg/dL — ABNORMAL HIGH (ref 8–23)
CO2: 24 mmol/L (ref 22–32)
Calcium: 8.4 mg/dL — ABNORMAL LOW (ref 8.9–10.3)
Chloride: 106 mmol/L (ref 98–111)
Creatinine, Ser: 1.09 mg/dL — ABNORMAL HIGH (ref 0.44–1.00)
GFR, Estimated: 54 mL/min — ABNORMAL LOW (ref >60)
Glucose, Bld: 194 mg/dL — ABNORMAL HIGH (ref 70–99)
Potassium: 4.3 mmol/L (ref 3.5–5.1)
Sodium: 138 mmol/L (ref 135–145)
Total Bilirubin: 0.3 mg/dL (ref 0.0–1.2)
Total Protein: 7 g/dL (ref 6.5–8.1)

## 2024-05-06 LAB — LACTIC ACID, PLASMA: Lactic Acid, Venous: 1.4 mmol/L (ref 0.5–1.9)

## 2024-05-06 LAB — URINALYSIS, ROUTINE W REFLEX MICROSCOPIC
Bilirubin Urine: NEGATIVE
Glucose, UA: >=500 mg/dL — AB
Hgb urine dipstick: NEGATIVE
Ketones, ur: NEGATIVE mg/dL
Nitrite: POSITIVE — AB
Protein, ur: NEGATIVE mg/dL
Specific Gravity, Urine: 1.032 — ABNORMAL HIGH (ref 1.005–1.030)
pH: 5 (ref 5.0–8.0)

## 2024-05-06 LAB — BLOOD GAS, VENOUS
Acid-base deficit: 1.1 mmol/L (ref 0.0–2.0)
Bicarbonate: 26.1 mmol/L (ref 20.0–28.0)
O2 Saturation: 69.4 %
Patient temperature: 37
pCO2, Ven: 53 mmHg (ref 44–60)
pH, Ven: 7.3 (ref 7.25–7.43)
pO2, Ven: 45 mmHg (ref 32–45)

## 2024-05-06 LAB — URINE DRUG SCREEN, QUALITATIVE (ARMC ONLY)
Amphetamines, Ur Screen: NOT DETECTED
Barbiturates, Ur Screen: NOT DETECTED
Benzodiazepine, Ur Scrn: POSITIVE — AB
Cannabinoid 50 Ng, Ur ~~LOC~~: POSITIVE — AB
Cocaine Metabolite,Ur ~~LOC~~: NOT DETECTED
MDMA (Ecstasy)Ur Screen: NOT DETECTED
Methadone Scn, Ur: NOT DETECTED
Opiate, Ur Screen: NOT DETECTED
Phencyclidine (PCP) Ur S: NOT DETECTED
Tricyclic, Ur Screen: POSITIVE — AB

## 2024-05-06 LAB — TROPONIN I (HIGH SENSITIVITY)
Troponin I (High Sensitivity): 5 ng/L (ref <18)
Troponin I (High Sensitivity): 4 ng/L (ref <18)

## 2024-05-06 LAB — BRAIN NATRIURETIC PEPTIDE: B Natriuretic Peptide: 16.6 pg/mL (ref 0.0–100.0)

## 2024-05-06 MED ORDER — SODIUM CHLORIDE 0.9 % IV BOLUS
1000.0000 mL | Freq: Once | INTRAVENOUS | Status: AC
  Administered 2024-05-06: 1000 mL via INTRAVENOUS

## 2024-05-06 NOTE — ED Triage Notes (Signed)
 Pt brought in by EMS for decreased LOC.  Pt was found by son in a recliner at home, unresponsive.  Upon EMS arrival pt was slightly responsive to painful stimuli and was hypotensive initially.  In route pt became more responsive, but remained lethargic.  All VS WNL in route.  Upon arrival to ED, pt sts that she is SOB, denies any pain.

## 2024-05-06 NOTE — ED Notes (Signed)
 Pt straight cath'd for urine sample, tolerated well, sample sent to lab

## 2024-05-06 NOTE — Assessment & Plan Note (Signed)
-   Hold Xanax  - Continue bupropion , asenapine , BuSpar

## 2024-05-06 NOTE — Hospital Course (Addendum)
 73 y.o. F with MO, DM, chronic subdural, depression/anxiety, hx CVA with right sided weakness, HTN, COPD not on O2, and OSA not on CPAP who presented with decreased responsiveness.    In normal health until today, son came home and found her unresponsive in her recliner.  Was initially hypotensive with EMS.    Roused slightly en route, but still sluggish int he ER.  CT head unremarkable.  CBC and CMP normal. VBG, BNP and troponin normal.  Admitted for decreased mentation due to polypharmacy.  CXR hypoinflated, ECG normal.  7/6.  Called to see the patient because she could not get out of the bed to go to the bathroom.  Trouble moving her legs.  As per patient she has started walking again took about 4 months to get her strength back.  Brought in with altered mental status and son could not wake her up. 7/7.  Did better with PT and OT today and recommending home with home health.  Mental status improved with cutting back on medications.

## 2024-05-06 NOTE — Assessment & Plan Note (Signed)
 Likely due to polypharmacy.  Will restart low-dose Xanax  at night 0.25 mg twice a day to avoid withdrawal.  Decrease dose of gabapentin .  Hold Myrbetriq .  Gentle fluids.

## 2024-05-06 NOTE — Assessment & Plan Note (Signed)
 Glucose 190s.  No acidosis. -Continue home Lantus  - Start sliding scale - Hold Farxiga , dulaglutide , metformin  - Continue gabapentin 

## 2024-05-06 NOTE — Assessment & Plan Note (Signed)
Not on CPAP at home. 

## 2024-05-06 NOTE — Assessment & Plan Note (Addendum)
 No active flare, VBG normal - Continue home Advair

## 2024-05-06 NOTE — Assessment & Plan Note (Signed)
 Today's hemoglobin 10.6.

## 2024-05-06 NOTE — Assessment & Plan Note (Signed)
 BMI 44.65, complicates care

## 2024-05-06 NOTE — Assessment & Plan Note (Signed)
 Resolved.  CT scan of the head does not show any subdural hematoma.

## 2024-05-06 NOTE — ED Provider Notes (Signed)
 [facility] Provider Note    Event Date/Time   First MD Initiated Contact with Patient 05/06/24 2116     (approximate)   History   Weakness and Shortness of Breath   HPI  Rachel Nielsen is a 73 y.o. female with a history of stroke, microcytic anemia, type 2 diabetes, diabetic neuropathy, hypertension, hyperlipidemia, COPD, CKD, GERD, and major depressive disorder who presents with altered mental status.  Per EMS, the patient was found sitting on her recliner, unresponsive.  Is unknown when she was last seen at her baseline.  During EMS transportation she started become more responsive.  She was initially hypotensive.  The patient herself is unable to give much history although can follow commands and answer basic questions.  She denies acute pain.  She does endorse shortness of breath.  I reviewed the past medical records.  The patient was admitted to the family medicine service at [facility] in February with recurrent falls.  She has no additional hospitalizations since that time.  Her last outpatient encounter was with endocrinology on 5/1.   Physical Exam   Triage Vital Signs: ED Triage Vitals  Encounter Vitals Group     BP      Girls Systolic BP Percentile      Girls Diastolic BP Percentile      Boys Systolic BP Percentile      Boys Diastolic BP Percentile      Pulse      Resp      Temp      Temp src      SpO2      Weight      Height      Head Circumference      Peak Flow      Pain Score      Pain Loc      Pain Education      Exclude from Growth Chart     Most recent vital signs: Vitals:   05/06/24 2136 05/06/24 2138  BP:  109/72  Pulse:  80  Resp:  18  Temp:  98.5 F (36.9 C)  SpO2: 95% 97%     General: Somnolent appearing but arousable, very weak, no distress.  CV:  Good peripheral perfusion.  Normal heart sounds. Resp:  Increased respiratory effort.  Faint wheezing bilaterally. Abd:  No distention.  Soft and  nontender. Other:  EOMI.  PERRLA.  No photophobia.  No facial droop.  Motor intact in all extremities.  Following commands.  Dry mucous membranes.  No peripheral edema.   ED Results / Procedures / Treatments   Labs (all labs ordered are listed, but only abnormal results are displayed) Labs Reviewed  COMPREHENSIVE METABOLIC PANEL WITH GFR - Abnormal; Notable for the following components:      Result Value   Glucose, Bld 194 (*)    BUN 30 (*)    Creatinine, Ser 1.09 (*)    Calcium  8.4 (*)    GFR, Estimated 54 (*)    All other components within normal limits  CBC WITH DIFFERENTIAL/PLATELET - Abnormal; Notable for the following components:   Hemoglobin 11.1 (*)    MCH 23.2 (*)    MCHC 28.5 (*)    RDW 18.3 (*)    All other components within normal limits  BRAIN NATRIURETIC PEPTIDE  LACTIC ACID, PLASMA  BLOOD GAS, VENOUS  URINALYSIS, ROUTINE W REFLEX MICROSCOPIC  URINE DRUG SCREEN, QUALITATIVE ([facility] ONLY)  TROPONIN I (HIGH SENSITIVITY)  TROPONIN I (  HIGH SENSITIVITY)     EKG  ED ECG REPORT I, Waylon Cassis, the attending physician, personally viewed and interpreted this ECG.  Date: 05/06/2024 EKG Time: 2255 Rate: 76 Rhythm: normal sinus rhythm QRS Axis: normal Intervals: normal ST/T Wave abnormalities: normal Narrative Interpretation: no evidence of acute ischemia    RADIOLOGY  Chest x-ray: I independently viewed and interpreted the images; there is atelectasis with no focal consolidation or edema.  CT head:  IMPRESSION:  1. No acute intracranial abnormality.  2. Partial empty sella. Findings is often a normal anatomic variant  but can be associated with idiopathic intracranial hypertension  (pseudotumor cerebri).     PROCEDURES:  Critical Care performed: No  Procedures   MEDICATIONS ORDERED IN ED: Medications  sodium chloride  0.9 % bolus 1,000 mL (1,000 mLs Intravenous New Bag/Given 05/06/24 2202)     IMPRESSION / MDM / ASSESSMENT AND PLAN / ED  COURSE  I reviewed the triage vital signs and the nursing notes.  73 year old female with PMH as noted above presents with altered mental status.  She was found unresponsive by her son.  Currently on exam she is lethargic but arousable, can follow commands, and answer very basic questions although appears very weak.  She has increased respiratory effort.  Neurologic exam is nonfocal.  Differential diagnosis includes, but is not limited to, acute infection/sepsis, dehydration, electrolyte abnormality, AKI, other metabolic disturbance, COPD exacerbation, hypercapnia, ICH or other primary CNS cause.  We will obtain CT head, chest x-ray, lab workup, give fluids, and reassess.  Patient's presentation is most consistent with acute presentation with potential threat to life or bodily function.  The patient is on the cardiac monitor to evaluate for evidence of arrhythmia and/or significant heart rate changes.  ----------------------------------------- 11:06 PM on 05/06/2024 -----------------------------------------  CT is negative for acute findings.  Troponin is negative.  CMP and CBC show no concerning acute abnormalities.  On review of the patient's history and medications, she is on Xanax  3 times daily.  This could be contributing to her mental status.  On reassessment the patient remains lethargic but arousable with a nonfocal exam.  Urinalysis is still pending.  ----------------------------------------- 11:20 PM on 05/06/2024 -----------------------------------------  I consulted and discussed case with Dr. Jonel from the hospitalist service; based on our discussion he agrees to evaluate the patient for admission  FINAL CLINICAL IMPRESSION(S) / ED DIAGNOSES   Final diagnoses:  Altered mental status, unspecified altered mental status type     Rx / DC Orders   ED Discharge Orders     None        Note:  This document was prepared using Dragon voice recognition software and may  include unintentional dictation errors.    Cassis Waylon, MD 05/06/24 2322

## 2024-05-06 NOTE — Assessment & Plan Note (Addendum)
 Hypertension Blood pressure controlled. CT head unremarkable.  Nothing focal on exam to suggest stroke. - Continue aspirin , Plavix , bisoprolol , Crestor 

## 2024-05-07 ENCOUNTER — Observation Stay

## 2024-05-07 DIAGNOSIS — I6203 Nontraumatic chronic subdural hemorrhage: Secondary | ICD-10-CM | POA: Diagnosis not present

## 2024-05-07 DIAGNOSIS — G9341 Metabolic encephalopathy: Secondary | ICD-10-CM | POA: Diagnosis not present

## 2024-05-07 DIAGNOSIS — R531 Weakness: Secondary | ICD-10-CM | POA: Diagnosis not present

## 2024-05-07 DIAGNOSIS — F32A Depression, unspecified: Secondary | ICD-10-CM

## 2024-05-07 DIAGNOSIS — E66813 Obesity, class 3: Secondary | ICD-10-CM

## 2024-05-07 DIAGNOSIS — D649 Anemia, unspecified: Secondary | ICD-10-CM

## 2024-05-07 DIAGNOSIS — Z8673 Personal history of transient ischemic attack (TIA), and cerebral infarction without residual deficits: Secondary | ICD-10-CM

## 2024-05-07 DIAGNOSIS — J432 Centrilobular emphysema: Secondary | ICD-10-CM

## 2024-05-07 DIAGNOSIS — N182 Chronic kidney disease, stage 2 (mild): Secondary | ICD-10-CM | POA: Diagnosis not present

## 2024-05-07 DIAGNOSIS — I10 Essential (primary) hypertension: Secondary | ICD-10-CM

## 2024-05-07 DIAGNOSIS — Z794 Long term (current) use of insulin: Secondary | ICD-10-CM

## 2024-05-07 DIAGNOSIS — F419 Anxiety disorder, unspecified: Secondary | ICD-10-CM

## 2024-05-07 DIAGNOSIS — E1122 Type 2 diabetes mellitus with diabetic chronic kidney disease: Secondary | ICD-10-CM

## 2024-05-07 LAB — BASIC METABOLIC PANEL WITH GFR
Anion gap: 6 (ref 5–15)
BUN: 29 mg/dL — ABNORMAL HIGH (ref 8–23)
CO2: 24 mmol/L (ref 22–32)
Calcium: 8.1 mg/dL — ABNORMAL LOW (ref 8.9–10.3)
Chloride: 110 mmol/L (ref 98–111)
Creatinine, Ser: 1.01 mg/dL — ABNORMAL HIGH (ref 0.44–1.00)
GFR, Estimated: 60 mL/min — ABNORMAL LOW (ref >60)
Glucose, Bld: 138 mg/dL — ABNORMAL HIGH (ref 70–99)
Potassium: 4.2 mmol/L (ref 3.5–5.1)
Sodium: 140 mmol/L (ref 135–145)

## 2024-05-07 LAB — URINALYSIS, W/ REFLEX TO CULTURE (INFECTION SUSPECTED)
Bilirubin Urine: NEGATIVE
Glucose, UA: >1000 mg/dL — AB
Hgb urine dipstick: NEGATIVE
Ketones, ur: NEGATIVE mg/dL
Nitrite: POSITIVE — AB
Protein, ur: NEGATIVE mg/dL
Specific Gravity, Urine: 1.02 (ref 1.005–1.030)
pH: 5.5 (ref 5.0–8.0)

## 2024-05-07 LAB — CBG MONITORING, ED
Glucose-Capillary: 86 mg/dL (ref 70–99)
Glucose-Capillary: 188 mg/dL — ABNORMAL HIGH (ref 70–99)
Glucose-Capillary: 150 mg/dL — ABNORMAL HIGH (ref 70–99)
Glucose-Capillary: 119 mg/dL — ABNORMAL HIGH (ref 70–99)

## 2024-05-07 LAB — HEMOGLOBIN A1C
Hgb A1c MFr Bld: 7.7 % — ABNORMAL HIGH (ref 4.8–5.6)
Mean Plasma Glucose: 174.29 mg/dL

## 2024-05-07 LAB — CBC
HCT: 36.7 % (ref 36.0–46.0)
Hemoglobin: 10.6 g/dL — ABNORMAL LOW (ref 12.0–15.0)
MCH: 23.7 pg — ABNORMAL LOW (ref 26.0–34.0)
MCHC: 28.9 g/dL — ABNORMAL LOW (ref 30.0–36.0)
MCV: 81.9 fL (ref 80.0–100.0)
Platelets: 159 K/uL (ref 150–400)
RBC: 4.48 MIL/uL (ref 3.87–5.11)
RDW: 18.5 % — ABNORMAL HIGH (ref 11.5–15.5)
WBC: 7.8 K/uL (ref 4.0–10.5)
nRBC: 0 % (ref 0.0–0.2)

## 2024-05-07 MED ORDER — DORZOLAMIDE HCL-TIMOLOL MAL 2-0.5 % OP SOLN
1.0000 [drp] | Freq: Two times a day (BID) | OPHTHALMIC | Status: DC
  Administered 2024-05-07 – 2024-05-08 (×2): 1 [drp] via OPHTHALMIC
  Filled 2024-05-07: qty 10

## 2024-05-07 MED ORDER — BUPROPION HCL ER (XL) 300 MG PO TB24
300.0000 mg | ORAL_TABLET | Freq: Every day | ORAL | Status: DC
  Administered 2024-05-07 – 2024-05-08 (×2): 300 mg via ORAL
  Filled 2024-05-07: qty 2
  Filled 2024-05-07: qty 1

## 2024-05-07 MED ORDER — MIRABEGRON ER 25 MG PO TB24: 25.0000 mg | ORAL_TABLET | Freq: Every day | ORAL | Status: DC

## 2024-05-07 MED ORDER — ENOXAPARIN SODIUM 60 MG/0.6ML IJ SOSY
0.5000 mg/kg | PREFILLED_SYRINGE | INTRAMUSCULAR | Status: DC
  Administered 2024-05-07 – 2024-05-08 (×2): 60 mg via SUBCUTANEOUS
  Filled 2024-05-07 (×2): qty 0.6

## 2024-05-07 MED ORDER — ONDANSETRON HCL 4 MG PO TABS
4.0000 mg | ORAL_TABLET | Freq: Four times a day (QID) | ORAL | Status: DC | PRN
Start: 2024-05-07 — End: 2024-05-08

## 2024-05-07 MED ORDER — GABAPENTIN 300 MG PO CAPS: 600.0000 mg | ORAL_CAPSULE | Freq: Every day | ORAL | Status: DC

## 2024-05-07 MED ORDER — ONDANSETRON HCL 4 MG/2ML IJ SOLN: 4.0000 mg | Freq: Four times a day (QID) | INTRAMUSCULAR | Status: DC | PRN

## 2024-05-07 MED ORDER — ASPIRIN 81 MG PO TBEC
81.0000 mg | DELAYED_RELEASE_TABLET | Freq: Every day | ORAL | Status: DC
  Administered 2024-05-07 – 2024-05-08 (×2): 81 mg via ORAL
  Filled 2024-05-07 (×2): qty 1

## 2024-05-07 MED ORDER — ACETAMINOPHEN 325 MG PO TABS
650.0000 mg | ORAL_TABLET | Freq: Four times a day (QID) | ORAL | Status: DC | PRN
Start: 2024-05-07 — End: 2024-05-08

## 2024-05-07 MED ORDER — ACETAMINOPHEN 650 MG RE SUPP: 650.0000 mg | Freq: Four times a day (QID) | RECTAL | Status: DC | PRN

## 2024-05-07 MED ORDER — ROSUVASTATIN CALCIUM 10 MG PO TABS
10.0000 mg | ORAL_TABLET | Freq: Every day | ORAL | Status: DC
  Administered 2024-05-07 – 2024-05-08 (×2): 10 mg via ORAL
  Filled 2024-05-07 (×3): qty 1

## 2024-05-07 MED ORDER — ASENAPINE MALEATE 5 MG SL SUBL
10.0000 mg | SUBLINGUAL_TABLET | Freq: Every day | SUBLINGUAL | Status: DC
  Administered 2024-05-07: 10 mg via SUBLINGUAL
  Filled 2024-05-07: qty 2

## 2024-05-07 MED ORDER — BISOPROLOL FUMARATE 5 MG PO TABS
10.0000 mg | ORAL_TABLET | Freq: Every day | ORAL | Status: DC
  Administered 2024-05-07 – 2024-05-08 (×2): 10 mg via ORAL
  Filled 2024-05-07 (×3): qty 2

## 2024-05-07 MED ORDER — INSULIN ASPART 100 UNIT/ML IJ SOLN: 0.0000 [IU] | Freq: Every day | INTRAMUSCULAR | Status: DC

## 2024-05-07 MED ORDER — INSULIN GLARGINE-YFGN 100 UNIT/ML ~~LOC~~ SOLN
10.0000 [IU] | Freq: Every day | SUBCUTANEOUS | Status: DC
  Administered 2024-05-07: 10 [IU] via SUBCUTANEOUS
  Filled 2024-05-07 (×2): qty 0.1

## 2024-05-07 MED ORDER — INSULIN ASPART 100 UNIT/ML IJ SOLN
0.0000 [IU] | Freq: Three times a day (TID) | INTRAMUSCULAR | Status: DC
  Administered 2024-05-07: 2 [IU] via SUBCUTANEOUS
  Administered 2024-05-07: 3 [IU] via SUBCUTANEOUS
  Filled 2024-05-07 (×2): qty 1

## 2024-05-07 MED ORDER — CLOPIDOGREL BISULFATE 75 MG PO TABS
75.0000 mg | ORAL_TABLET | Freq: Every day | ORAL | Status: DC
  Administered 2024-05-07 – 2024-05-08 (×2): 75 mg via ORAL
  Filled 2024-05-07 (×2): qty 1

## 2024-05-07 MED ORDER — MONTELUKAST SODIUM 10 MG PO TABS
10.0000 mg | ORAL_TABLET | Freq: Every day | ORAL | Status: DC
  Administered 2024-05-07 – 2024-05-08 (×2): 10 mg via ORAL
  Filled 2024-05-07 (×2): qty 1

## 2024-05-07 MED ORDER — ALPRAZOLAM 0.25 MG PO TABS
0.2500 mg | ORAL_TABLET | Freq: Two times a day (BID) | ORAL | Status: DC
  Administered 2024-05-07 – 2024-05-08 (×2): 0.25 mg via ORAL
  Filled 2024-05-07 (×2): qty 1

## 2024-05-07 MED ORDER — BRIMONIDINE TARTRATE 0.2 % OP SOLN
1.0000 [drp] | Freq: Three times a day (TID) | OPHTHALMIC | Status: DC
  Administered 2024-05-07 – 2024-05-08 (×2): 1 [drp] via OPHTHALMIC
  Filled 2024-05-07: qty 5

## 2024-05-07 MED ORDER — FLUTICASONE FUROATE-VILANTEROL 200-25 MCG/ACT IN AEPB
1.0000 | INHALATION_SPRAY | Freq: Every day | RESPIRATORY_TRACT | Status: DC
  Administered 2024-05-07 – 2024-05-08 (×2): 1 via RESPIRATORY_TRACT
  Filled 2024-05-07: qty 28

## 2024-05-07 MED ORDER — GABAPENTIN 300 MG PO CAPS
300.0000 mg | ORAL_CAPSULE | Freq: Every day | ORAL | Status: DC
  Administered 2024-05-07 – 2024-05-08 (×2): 300 mg via ORAL
  Filled 2024-05-07 (×2): qty 1

## 2024-05-07 MED ORDER — BUSPIRONE HCL 10 MG PO TABS
15.0000 mg | ORAL_TABLET | Freq: Every day | ORAL | Status: DC
  Administered 2024-05-07 – 2024-05-08 (×2): 15 mg via ORAL
  Filled 2024-05-07: qty 3
  Filled 2024-05-07: qty 2

## 2024-05-07 MED ORDER — SODIUM CHLORIDE 0.9 % IV SOLN: INTRAVENOUS | Status: AC

## 2024-05-07 MED ORDER — LATANOPROST 0.005 % OP SOLN
1.0000 [drp] | Freq: Every day | OPHTHALMIC | Status: DC
  Administered 2024-05-07: 1 [drp] via OPHTHALMIC
  Filled 2024-05-07 (×2): qty 2.5

## 2024-05-07 NOTE — ED Notes (Signed)
 Spoke with daughter, Bevelyn and gave her an update on pt's condition.

## 2024-05-07 NOTE — ED Notes (Signed)
 Pt more responsive, answering questions and following commands.  Per pt ok to update daughter, Bevelyn on her visit to hospital.

## 2024-05-07 NOTE — Progress Notes (Signed)
 Progress Note   Patient: Rachel Nielsen FMW:[id number] DOB: 1951/09/21 DOA: 05/06/2024     0 DOS: the patient was seen and examined on 05/07/2024   Brief hospital course: 73 y.o. F with MO, DM, chronic subdural, depression/anxiety, hx CVA with right sided weakness, HTN, COPD not on O2, and OSA not on CPAP who presented with decreased responsiveness.    In normal health until today, son came home and found her unresponsive in her recliner.  Was initially hypotensive with EMS.    Roused slightly en route, but still sluggish int he ER.  CT head unremarkable.  CBC and CMP normal. VBG, BNP and troponin normal.  Admitted for decreased mentation due to polypharmacy.  CXR hypoinflated, ECG normal.  7/6.  Called to see the patient because she could not get out of the bed to go to the bathroom.  Trouble moving her legs.  As per patient she has started walking again took about 4 months to get her strength back.  Brought in with altered mental status and son could not wake her up.  Assessment and Plan: * Acute metabolic encephalopathy Likely due to polypharmacy.  Will restart low-dose Xanax  at night 0.25 mg twice a day to avoid withdrawal.  Decrease dose of gabapentin .  Hold Myrbetriq .  Gentle fluids.    Weakness Patient hardly able to straight leg raise.  Nursing staff could not get her to the bathroom.  Spoke with patient and patient's son and it took 4 months to get her strength back from where it was.  Will get PT and OT evaluations.  Patient had 3 falls within the past week.  CKD (chronic kidney disease), stage II Today's creatinine 1.01 with a GFR 60  Chronic subdural hematoma (HCC) Resolved.  CT scan of the head does not show any subdural hematoma.  Chronic anemia Today's hemoglobin 10.6.  Obesity, Class III, BMI 40-49.9 (morbid obesity) BMI 44.65, complicates care  History of CVA (cerebrovascular accident) Hypertension Blood pressure controlled. CT head unremarkable.  Nothing focal on  exam to suggest stroke. - Continue aspirin , Plavix , bisoprolol , Crestor   OSA (obstructive sleep apnea) Not on CPAP at home  Anxiety and depression - Continue bupropion , asenapine , BuSpar  Restart Xanax  at a lower dose this evening to avoid withdrawal.  Centrilobular emphysema (HCC) No active flare, VBG normal - Continue home Advair  T2DM (type 2 diabetes mellitus) (HCC) Glucose 190s.  No acidosis.  Last hemoglobin A1c 7.7. -Continue home Lantus  - Start sliding scale - Hold Farxiga , dulaglutide , metformin  - Continue gabapentin  (at lower dose)  Hypertension On bisoprolol         Subjective: Patient asked why she is in the hospital.  Brought in secondary to altered mental status and son could not wake her up.  Nursing staff could not get her up to walk to the bathroom today.  She can hardly straight leg raise today.  Physical Exam: Vitals:   05/07/24 0730 05/07/24 0800 05/07/24 0900 05/07/24 1010  BP: 137/78 138/79 123/78   Pulse: 81 79 93   Resp: 16 16 20    Temp:    98.4 F (36.9 C)  TempSrc:    Axillary  SpO2: 99% 100% 98%   Weight:      Height:       Physical Exam HENT:     Mouth/Throat:     Pharynx: No oropharyngeal exudate.  Eyes:     General: Lids are normal.     Conjunctiva/sclera: Conjunctivae normal.  Cardiovascular:  Rate and Rhythm: Normal rate and regular rhythm.     Heart sounds: Normal heart sounds, S1 normal and S2 normal.  Pulmonary:     Breath sounds: No decreased breath sounds, wheezing, rhonchi or rales.  Abdominal:     Palpations: Abdomen is soft.     Tenderness: There is no abdominal tenderness.  Musculoskeletal:     Right lower leg: No swelling.     Left lower leg: No swelling.  Skin:    General: Skin is warm.     Findings: No rash.  Neurological:     Mental Status: She is alert.     Comments: Barely able to straight leg raise     Data Reviewed: Creatinine 1.01, GFR 60, platelet count 159, hemoglobin 10.6, white blood cell  count 7.8  Family Communication: Updated son on the phone  Disposition: Status is: Observation Nursing staff tried to get her to the bathroom but she could not get off the bed.  Patient barely able to straight leg raise.  PT and OT evaluation.  Planned Discharge Destination: To be determined    Time spent: 28 minutes  Author: Charlie Patterson, MD 05/07/2024 12:22 PM  For on call review [email or web address].

## 2024-05-07 NOTE — ED Notes (Signed)
 Tried to help pt walk to toilet with walker and second RN. Pt unable to move legs enough to walk or take steps. Got pt back into bed and repositioned. Purewick placed.

## 2024-05-07 NOTE — Assessment & Plan Note (Signed)
 Patient's strength has not improved.  Work with occupational therapy and physical therapy today.  Recommendations changed to home with home health.

## 2024-05-07 NOTE — Assessment & Plan Note (Signed)
On bisoprolol

## 2024-05-07 NOTE — Assessment & Plan Note (Signed)
 Today's creatinine 1.01 with a GFR 60

## 2024-05-07 NOTE — Care Management Obs Status (Signed)
 MEDICARE OBSERVATION STATUS NOTIFICATION   Patient Details  Name: LEYDA VANDERWERF MRN: [id number] Date of Birth: 04-Aug-1951   Medicare Observation Status Notification Given:  Yes    [place] L Delaney Schnick, LCSW 05/07/2024, 11:20 AM

## 2024-05-07 NOTE — ED Notes (Signed)
Provided snack

## 2024-05-07 NOTE — Progress Notes (Signed)
 Anticoagulation monitoring(Lovenox ):  73 yo  female ordered Lovenox  40 mg Q24h    Filed Weights   05/06/24 2141  Weight: 118 kg (260 lb 2.3 oz)   BMI 44.7    Lab Results  Component Value Date   CREATININE 1.09 (H) 05/06/2024   CREATININE 0.90 12/28/2023   CREATININE 0.89 12/27/2023   Estimated Creatinine Clearance: 59.8 mL/min (A) (by C-G formula based on SCr of 1.09 mg/dL (H)). Hemoglobin & Hematocrit     Component Value Date/Time   HGB 11.1 (L) 05/06/2024 2145   HCT 38.9 05/06/2024 2145     Per Protocol for Patient with estCrcl > 30 ml/min and BMI > 30, will transition to Lovenox  60 mg Q24h.

## 2024-05-07 NOTE — Evaluation (Signed)
 Physical Therapy Evaluation Patient Details Name: Rachel Nielsen MRN: [id number] DOB: 1951/02/02 Today's Date: 05/07/2024  History of Present Illness  Pt is a 73 y.o. F with MO, DM, chronic subdural, depression/anxiety, hx CVA with right sided weakness, HTN, COPD not on O2, and OSA not on CPAP who presented with decreased responsiveness. MD assessment includes: acute metabolic encephalopathy, weakness, and HTN.   Clinical Impression  Pt was pleasant and motivated to participate during the session and put forth good effort throughout. Pt required physical assistance with all functional mobility tasks per below along with cues for general sequencing.  Pt presented with min instability in standing and had difficulty advancing LEs without shuffling and required min A to prevent LOB and to guide the RW.  Pt reported no adverse symptoms during the session with SpO2 and HR WNL throughout on room air. Pt will benefit from continued PT services upon discharge to safely address deficits listed in patient problem list for decreased caregiver assistance and eventual return to PLOF.          If plan is discharge home, recommend the following: A lot of help with walking and/or transfers;A little help with bathing/dressing/bathroom;Assistance with cooking/housework;Help with stairs or ramp for entrance;Assist for transportation;Direct supervision/assist for financial management   Can travel by private vehicle   No    Equipment Recommendations Other (comment) (TBD at the next venue of care)  Recommendations for Other Services       Functional Status Assessment Patient has had a recent decline in their functional status and demonstrates the ability to make significant improvements in function in a reasonable and predictable amount of time.     Precautions / Restrictions Precautions Precautions: Fall Restrictions Weight Bearing Restrictions Per Provider Order: No      Mobility  Bed Mobility Overal  bed mobility: Needs Assistance Bed Mobility: Supine to Sit, Sit to Supine     Supine to sit: +2 for physical assistance, Mod assist Sit to supine: Mod assist, +2 for physical assistance   General bed mobility comments: Assist for BLE and trunk control with cues for sequencing    Transfers Overall transfer level: Needs assistance Equipment used: Rolling walker (2 wheels) Transfers: Sit to/from Stand Sit to Stand: Min assist, From elevated surface           General transfer comment: Min A to come to standing but from significantly elevated surface of ER bed, min verbal cues for hand placement    Ambulation/Gait Ambulation/Gait assistance: Min assist Gait Distance (Feet): 3 Feet Assistive device: Rolling walker (2 wheels) Gait Pattern/deviations: Decreased step length - right, Decreased step length - left, Step-to pattern, Shuffle Gait velocity: decreased     General Gait Details: Pt able to take several shuffling steps at the EOB with min A for stability and to guide the RW  Stairs            Wheelchair Mobility     Tilt Bed    Modified Rankin (Stroke Patients Only)       Balance Overall balance assessment: Needs assistance   Sitting balance-Leahy Scale: Good     Standing balance support: During functional activity, Bilateral upper extremity supported Standing balance-Leahy Scale: Poor                               Pertinent Vitals/Pain Pain Assessment Pain Assessment: No/denies pain    Home Living Family/patient expects to be discharged to::  Private residence Living Arrangements: Children Available Help at Discharge: Family;Available 24 hours/day Type of Home: House Home Access: Stairs to enter Entrance Stairs-Rails: None Entrance Stairs-Number of Steps: Small threshold at back entrance   Home Layout: One level Home Equipment: Rollator (4 wheels);Cane - single point;Grab bars - tub/shower;Rolling Walker (2 wheels);Tub bench       Prior Function Prior Level of Function : History of Falls (last six months);Needs assist             Mobility Comments: Mod Ind amb with a rollator in the home and a SPC from home to/from car, uses a facility w/c for MD apts, household distance ambulator only, 12 falls in the last 6 months secondary to LOB and/or leg buckling ADLs Comments: Ind with ADLs     Extremity/Trunk Assessment   Upper Extremity Assessment Upper Extremity Assessment: Generalized weakness    Lower Extremity Assessment Lower Extremity Assessment: Generalized weakness       Communication   Communication Communication: No apparent difficulties    Cognition Arousal: Alert Behavior During Therapy: WFL for tasks assessed/performed   PT - Cognitive impairments: No apparent impairments                         Following commands: Intact       Cueing Cueing Techniques: Verbal cues, Tactile cues, Visual cues     General Comments      Exercises     Assessment/Plan    PT Assessment Patient needs continued PT services  PT Problem List Decreased strength;Decreased range of motion;Decreased activity tolerance;Decreased balance;Decreased mobility;Decreased knowledge of use of DME       PT Treatment Interventions DME instruction;Gait training;Stair training;Functional mobility training;Therapeutic activities;Therapeutic exercise;Balance training;Patient/family education    PT Goals (Current goals can be found in the Care Plan section)  Acute Rehab PT Goals Patient Stated Goal: To get stronger PT Goal Formulation: With patient Time For Goal Achievement: 05/20/24 Potential to Achieve Goals: Good    Frequency Min 2X/week     Co-evaluation               AM-PAC PT 6 Clicks Mobility  Outcome Measure Help needed turning from your back to your side while in a flat bed without using bedrails?: A Lot Help needed moving from lying on your back to sitting on the side of a flat bed  without using bedrails?: A Lot Help needed moving to and from a bed to a chair (including a wheelchair)?: A Lot Help needed standing up from a chair using your arms (e.g., wheelchair or bedside chair)?: A Little Help needed to walk in hospital room?: Total Help needed climbing 3-5 steps with a railing? : Total 6 Click Score: 11    End of Session Equipment Utilized During Treatment: Gait belt Activity Tolerance: Patient tolerated treatment well Patient left: in bed;with call bell/phone within reach;Other (comment) (bilateral ER bed rails up as found) Nurse Communication: Mobility status PT Visit Diagnosis: Unsteadiness on feet (R26.81);History of falling (Z91.81);Difficulty in walking, not elsewhere classified (R26.2);Muscle weakness (generalized) (M62.81)    Time: 8557-8492 PT Time Calculation (min) (ACUTE ONLY): 25 min   Charges:   PT Evaluation $PT Eval Moderate Complexity: 1 Mod   PT General Charges $$ ACUTE PT VISIT: 1 Visit       D. Scott Taige Housman PT, DPT 05/07/24, 3:50 PM

## 2024-05-07 NOTE — H&P (Addendum)
 History and Physical    Patient: Rachel Nielsen FMW:[id number] DOB: 01-03-1951 DOA: 05/06/2024 DOS: the patient was seen and examined on 05/07/2024 PCP: Lane Arthea BRAVO, MD  Patient coming from: Home  Chief Complaint:  Chief Complaint  Patient presents with   Weakness   Shortness of Breath       HPI:  73 y.o. F with MO, DM, chronic subdural, depression/anxiety, hx CVA with right sided weakness, HTN, COPD not on O2, and OSA not on CPAP who presented with decreased responsiveness.    Caveat that patient is altered and history appears unreliable.   Per EDP, was in normal health until today, son came home and found her unresponsive in her recliner.  Was initially hypotensive with EMS.    Roused slightly en route, but still sluggish int he ER.  CT head unremarkable.  CBC and CMP normal. VBG, BNP and troponin normal.  Admitted for decreased mentation due to polypharmacy.  CXR hypoinflated, ECG normal.  Was admitted under similar circumstances in Jan and Feb of this year at [facility], diagnosed with encephalopathy from polypharmacy at that time, meds were decreased, she evidently was better in the interim until now.      Review of Systems  Unable to perform ROS: Mental status change  Respiratory:  Positive for wheezing. Negative for cough, sputum production and shortness of breath.   Cardiovascular:  Negative for chest pain and leg swelling.  Genitourinary:  Negative for dysuria, frequency, hematuria and urgency.  All other systems reviewed and are negative.  Caveat that patient is very sleepy and responses appear unreliable.   Past Medical History:  Diagnosis Date   Acute metabolic encephalopathy 09/28/2023   Allergy    Anxiety    Asthma    Colon polyps    Constipation    COPD (chronic obstructive pulmonary disease) (HCC)    Depression    GERD (gastroesophageal reflux disease)    Glaucoma    Heart attack (HCC)    Hypertension    Hypokalemia 09/28/2023   Sleep apnea     Sleep apnea    Stroke [facility])    Urinary incontinence    Past Surgical History:  Procedure Laterality Date   APPENDECTOMY     CHOLECYSTECTOMY     COLONOSCOPY WITH PROPOFOL  N/A 07/20/2018   Procedure: COLONOSCOPY WITH PROPOFOL ;  Surgeon: Toledo, Ladell POUR, MD;  Location: [facility] ENDOSCOPY;  Service: Gastroenterology;  Laterality: N/A;   ESOPHAGOGASTRODUODENOSCOPY N/A 07/20/2018   Procedure: ESOPHAGOGASTRODUODENOSCOPY (EGD);  Surgeon: Toledo, Ladell POUR, MD;  Location: [facility] ENDOSCOPY;  Service: Gastroenterology;  Laterality: N/A;   GASTRECTOMY     TUBAL LIGATION     VAGINAL HYSTERECTOMY     Social History:  reports that she has quit smoking. She has never used smokeless tobacco. She reports that she does not currently use alcohol. She reports that she does not use drugs.  Allergies  Allergen Reactions   Ace Inhibitors Cough    Family History  Problem Relation Age of Onset   Hypertension Mother    Atrial fibrillation Mother    Heart attack Mother    Hypertension Father    Stomach cancer Father    Diabetes Father    Liver cancer Father    Diabetes Sister    Hypertension Sister     Prior to Admission medications   Medication Sig Start Date End Date Taking? Authorizing Provider  albuterol  (PROVENTIL ) (2.5 MG/3ML) 0.083% nebulizer solution Inhale 3 mLs (2.5 mg total) into the lungs every  4 (four) hours as needed for wheezing or shortness of breath. Patient taking differently: Inhale 2.5 mg into the lungs every 6 (six) hours as needed for wheezing or shortness of breath. 12/23/21 08/29/24  Caleen Qualia, MD  albuterol  (VENTOLIN  HFA) 108 (90 Base) MCG/ACT inhaler Inhale 2-4 puffs by mouth every 4 hours as needed for wheezing, cough, and/or shortness of breath Patient taking differently: 2 puffs every 6 (six) hours as needed. 10/21/20   Malfi, Nat HERO, FNP  ALPRAZolam  (XANAX ) 1 MG tablet Take 1 mg by mouth 3 (three) times daily as needed for anxiety or sleep. 08/19/21   [provider]  amLODipine  (NORVASC ) 5 MG tablet Take 1 tablet (5 mg total) by mouth daily. 06/29/23   Antonette Angeline ORN, NP  Asenapine  Maleate 10 MG SUBL Place 10 mg under the tongue at bedtime. 04/22/21   [provider]  aspirin  81 MG EC tablet Take 1 tablet (81 mg total) by mouth daily. Swallow whole. 02/10/22   Antonette Angeline ORN, NP  bisoprolol  (ZEBETA ) 10 MG tablet Take 1 tablet (10 mg total) by mouth daily. OFFICE VISIT NEEDED FOR ADDITIONAL REFILLS 04/26/23   Antonette Angeline ORN, NP  brimonidine  (ALPHAGAN ) 0.2 % ophthalmic solution 1 drop 3 (three) times daily. 09/09/23   [provider]  buPROPion  (WELLBUTRIN  XL) 300 MG 24 hr tablet Take 1 tablet (300 mg total) by mouth daily. Patient taking differently: Take 300 mg by mouth at bedtime. 10/08/20   Viviann Pastor, MD  busPIRone  (BUSPAR ) 15 MG tablet Take 15 mg by mouth daily. 11/08/17   [provider]  clopidogrel  (PLAVIX ) 75 MG tablet Take 75 mg by mouth daily.    [provider]  dorzolamide -timolol  (COSOPT ) 2-0.5 % ophthalmic solution Place 1 drop into both eyes 2 (two) times daily. 06/08/23   [provider]  Dulaglutide  3 MG/0.5ML SOPN Inject 0.5 mLs (3 mg total) subcutaneously once a week 01/07/22   [provider]  famotidine  (PEPCID ) 20 MG tablet Take 20 mg by mouth daily.    [provider]  FARXIGA  10 MG TABS tablet Take 10 mg by mouth daily. 09/08/21   [provider]  ferrous sulfate  325 (65 FE) MG tablet Take 1 tablet (325 mg total) by mouth every other day. 12/28/23   Theophilus Pagan, MD  fluticasone  (FLONASE ) 50 MCG/ACT nasal spray Place 1 spray into both nostrils daily. Patient taking differently: Place 2 sprays into both nostrils 2 (two) times daily. 02/16/23   Antonette Angeline ORN, NP  fluticasone -salmeterol (ADVAIR) 250-50 MCG/ACT AEPB Inhale 1 puff into the lungs 2 (two) times daily. 09/09/21   [provider]  gabapentin  (NEURONTIN ) 300 MG capsule Take 2 capsules  (600 mg total) by mouth daily. 12/28/23   Theophilus Pagan, MD  LANTUS  SOLOSTAR 100 UNIT/ML Solostar Pen Inject 10 Units into the skin at bedtime. 12/28/23 12/27/24  Theophilus Pagan, MD  linaclotide  (LINZESS ) 290 MCG CAPS capsule Take 1 capsule (290 mcg total) by mouth daily before breakfast. 04/12/23   Antonette Angeline ORN, NP  metFORMIN  (GLUCOPHAGE ) 1000 MG tablet Take 1,000 mg by mouth 2 (two) times daily with a meal. 01/21/22   [provider]  montelukast  (SINGULAIR ) 10 MG tablet Take 1 tablet (10 mg total) by mouth daily. Patient taking differently: Take 10 mg by mouth at bedtime. 10/08/20   Viviann Pastor, MD  MYRBETRIQ  25 MG TB24 tablet Take 25 mg by mouth at bedtime. 09/20/23   [provider]  nortriptyline  (PAMELOR ) 10  MG capsule Take 10 mg by mouth at bedtime. 11/08/23   [provider]  pantoprazole  (PROTONIX ) 40 MG tablet Take 1 tablet (40 mg total) by mouth 2 (two) times daily. 09/14/23   Antonette Angeline ORN, NP  rosuvastatin  (CRESTOR ) 10 MG tablet Take 1 tablet (10 mg total) by mouth daily. 09/14/23   Antonette Angeline ORN, NP  terconazole (TERAZOL 7) 0.4 % vaginal cream Place 1 applicator vaginally at bedtime. Patient not taking: Reported on 12/27/2023 12/07/23   [provider]  TRAVATAN  Z 0.004 % SOLN ophthalmic solution Place 1 drop into both eyes at bedtime. 10/08/20   Viviann Pastor, MD    Physical Exam: Vitals:   05/06/24 2136 05/06/24 2138 05/06/24 2141  BP:  109/72   Pulse:  80   Resp:  18   Temp:  98.5 F (36.9 C)   TempSrc:  Oral   SpO2: 95% 97%   Weight:   118 kg  Height:   5' 4 (1.626 m)   Obese adult female, lying in bed, appears weak and tired, sleepy, opens eyes and tries to answer questions then falls back asleep Anicteric, conjunctival pink, lids and lashes normal.  No nasal deformity, discharge, or epistaxis Oropharynx dry, dentition in good repair, no oral lesions, lips normal RRR, no murmurs, no peripheral edema Respiratory rate  normal, lungs clear without rales or wheezes, she has some upper airway wheezing Abdomen soft, no tenderness palpation or guarding, no suprapubic tenderness Attention diminished, affect sleepy, judgment and insight appear impaired although she is oriented to self, [facility], answers are slowed and sleepy, she has generalized weakness but 4/5 strength in all 4 extremities, no reduced sensation to light touch    Data Reviewed: Basic metabolic panel shows normal electrolytes and renal function, mild dehydration with elevated BUN to creatinine ratio, BNP normal Troponin low and flat Lactic acid normal VBG unremarkable CBC shows mild anemia Urinalysis positive for leukocytes and nitrates Urine drug screen positive for benzodiazepines, cannabinoids, tricyclic's CT head shows chronic small vessel disease, no subdural, no fracture, no acute bleeding EKG, personally reviewed, shows sinus rhythm, no ST changes Chest x-ray, personally reviewed, shows hypoinflation, no infiltrates or effusions    Assessment and Plan: * Acute metabolic encephalopathy Likely due to polypharmacy again, like in Jan and Feb 2025.  Endocrinology note from May states has been dealing with recurrent falls (was admitted in 12/2023 for this and was suspected to have AMS and weakness due to polypharmacy with multiple psychotropic medications, which has since improved since reducing these  - Hold Xanax  - IV fluids overnight and monitor - PT eval - Defer MRI brain and EEG for now, given strong suspicion for polypharmacy   Chronic subdural hematoma (HCC) Resolved on CT head  Chronic anemia Hemoglobin stable relative to baseline  Obesity, Class III, BMI 40-49.9 (morbid obesity) BMI 44, complicates care  History of CVA (cerebrovascular accident) Hypertension Blood pressure controlled. CT head unremarkable.  Nothing focal on exam to suggest stroke. - Continue aspirin , Plavix , bisoprolol ,  Crestor   OSA (obstructive sleep apnea) Not on CPAP at home  Anxiety and depression - Hold Xanax  - Continue bupropion , asenapine , BuSpar   Centrilobular emphysema (HCC) No active flare, VBG normal - Continue home Advair  T2DM (type 2 diabetes mellitus) (HCC) Glucose 190s.  No acidosis. -Continue home Lantus  - Start sliding scale - Hold Farxiga , dulaglutide , metformin  - Continue gabapentin   Asymptomatic bacteriuria Unreliable history, but no clear symptoms to suggest UTI  Advance Care Planning: Full code  Consults: None needed  Family Communication: None present  Severity of Illness: The appropriate patient status for this patient is OBSERVATION. Observation status is judged to be reasonable and necessary in order to provide the required intensity of service to ensure the patient's safety. The patient's presenting symptoms, physical exam findings, and initial radiographic and laboratory data in the context of their medical condition is felt to place them at decreased risk for further clinical deterioration. Furthermore, it is anticipated that the patient will be medically stable for discharge from the hospital within 2 midnights of admission.   Author: Lonni SHAUNNA Dalton, MD 05/07/2024 12:17 AM  For on call review [email or web address].

## 2024-05-08 DIAGNOSIS — I6203 Nontraumatic chronic subdural hemorrhage: Secondary | ICD-10-CM | POA: Diagnosis not present

## 2024-05-08 DIAGNOSIS — R531 Weakness: Secondary | ICD-10-CM | POA: Diagnosis not present

## 2024-05-08 DIAGNOSIS — N189 Chronic kidney disease, unspecified: Secondary | ICD-10-CM

## 2024-05-08 DIAGNOSIS — G9341 Metabolic encephalopathy: Secondary | ICD-10-CM | POA: Diagnosis not present

## 2024-05-08 DIAGNOSIS — N179 Acute kidney failure, unspecified: Secondary | ICD-10-CM | POA: Diagnosis not present

## 2024-05-08 LAB — BASIC METABOLIC PANEL WITH GFR
Anion gap: 5 (ref 5–15)
BUN: 18 mg/dL (ref 8–23)
CO2: 24 mmol/L (ref 22–32)
Calcium: 8.2 mg/dL — ABNORMAL LOW (ref 8.9–10.3)
Chloride: 114 mmol/L — ABNORMAL HIGH (ref 98–111)
Creatinine, Ser: 0.76 mg/dL (ref 0.44–1.00)
GFR, Estimated: >60 mL/min (ref >60)
Glucose, Bld: 89 mg/dL (ref 70–99)
Potassium: 3.6 mmol/L (ref 3.5–5.1)
Sodium: 143 mmol/L (ref 135–145)

## 2024-05-08 LAB — CBG MONITORING, ED: Glucose-Capillary: 91 mg/dL (ref 70–99)

## 2024-05-08 LAB — CK: Total CK: 56 U/L (ref 38–234)

## 2024-05-08 MED ORDER — LANTUS SOLOSTAR 100 UNIT/ML ~~LOC~~ SOPN: 8.0000 [IU] | PEN_INJECTOR | Freq: Every day | SUBCUTANEOUS | Status: AC

## 2024-05-08 MED ORDER — PANTOPRAZOLE SODIUM 40 MG PO TBEC: 40.0000 mg | DELAYED_RELEASE_TABLET | Freq: Every day | ORAL | Status: AC

## 2024-05-08 MED ORDER — ALPRAZOLAM 0.25 MG PO TABS: 0.2500 mg | ORAL_TABLET | Freq: Two times a day (BID) | ORAL | 0 refills | Status: AC

## 2024-05-08 MED ORDER — MONTELUKAST SODIUM 10 MG PO TABS: 10.0000 mg | ORAL_TABLET | Freq: Every day | ORAL | Status: AC

## 2024-05-08 MED ORDER — GABAPENTIN 300 MG PO CAPS
300.0000 mg | ORAL_CAPSULE | Freq: Two times a day (BID) | ORAL | 0 refills | Status: AC
Start: 2024-05-08 — End: ?

## 2024-05-08 NOTE — TOC Transition Note (Signed)
 Transition of Care [facility]) - Discharge Note   Patient Details  Name: Rachel Nielsen MRN: [id number] Date of Birth: Mar 07, 1951  Transition of Care [facility]) CM/SW Contact:  Marinda Cooks, RN Phone Number: 05/08/2024, 11:35 AM   Clinical Narrative:    This CM updated by covering MD pt medically cleared to dc today and has active DC order . HH ROC  arranged with [facility] .DC transportation confirmed for pt with son . Medical team updated . No additional DC needs requested by medical team or identified by CM at this time .     Final next level of care: Home w Home Health Services Barriers to Discharge: No Barriers Identified       Name of family member notified: Raygen Linquist  Son  Emergency Contact  [phone] Patient and family notified of of transfer: 05/08/24  Discharge Plan and Services Additional resources added to the After Visit Summary for                            [facility] Arranged: PT, OT [facility] Agency: [facility] Date [facility] Agency Contacted: 05/08/24 Time HH Agency Contacted: [phone] Representative spoke with at [facility] Agency: Larraine  Social Drivers of Health (SDOH) Interventions SDOH Screenings   Food Insecurity: Patient Declined (05/08/2024)  Housing: Patient Declined (05/08/2024)  Transportation Needs: Unknown (05/08/2024)  Utilities: Patient Declined (05/08/2024)  Alcohol Screen: Low Risk  (12/25/2022)  Depression (PHQ2-9): High Risk (02/12/2023)  Financial Resource Strain: Low Risk  (12/06/2023)   Received from [facility]  Physical Activity: Inactive (12/25/2022)  Social Connections: Patient Declined (05/08/2024)  Stress: No Stress Concern Present (12/25/2022)  Tobacco Use: Medium Risk (05/06/2024)     Readmission Risk Interventions    12/27/2023    4:14 PM  Readmission Risk Prevention Plan  Transportation Screening Complete  PCP or Specialist Appt within 5-7 Days Complete  Home Care Screening Complete  Medication Review (RN CM) Complete

## 2024-05-08 NOTE — Assessment & Plan Note (Signed)
 AKI on CKD stage II.  Creatinine 1.09 on presentation and improved to 0.76.

## 2024-05-08 NOTE — Evaluation (Signed)
 Occupational Therapy Evaluation Patient Details Name: Rachel Nielsen MRN: [id number] DOB: April 05, 1951 Today's Date: 05/08/2024   History of Present Illness   Pt is a 73 y.o. F with MO, DM, chronic subdural, depression/anxiety, hx CVA with right sided weakness, HTN, COPD not on O2, and OSA not on CPAP who presented with decreased responsiveness. MD assessment includes: acute metabolic encephalopathy, weakness, and HTN.     Clinical Impressions Pt was seen for OT evaluation this date. PTA, pt resides at home and her son lives with her. Pt reports using a rollator for household distances only, SPC to get to/from car, and is IND with ADL performance. Pt reports she has a W/C for transport purposes for MD appts.   Pt presents to acute OT demonstrating impaired ADL performance and functional mobility 2/2 weakness and mild balance deficits. Pt currently requires supervision for bed mobility and LB dressing to don socks. She needed verb cues for hand placement and CGA for STS from lowest bed height to RW, then ambulated ~20 ft using RW with CGA and was left seated in recliner. HR and sp02 stable.  Pt would benefit from skilled OT services to address noted impairments and functional limitations to maximize safety and independence while minimizing falls risk and caregiver burden. Recommend HH therapy services to continue progressing her strength to return to PLOF.      If plan is discharge home, recommend the following:   A little help with walking and/or transfers;A little help with bathing/dressing/bathroom;Help with stairs or ramp for entrance     Functional Status Assessment   Patient has had a recent decline in their functional status and demonstrates the ability to make significant improvements in function in a reasonable and predictable amount of time.     Equipment Recommendations   Other (comment) (has needed equipment)     Recommendations for Other Services          Precautions/Restrictions   Precautions Precautions: Fall Recall of Precautions/Restrictions: Intact Restrictions Weight Bearing Restrictions Per Provider Order: No     Mobility Bed Mobility Overal bed mobility: Needs Assistance Bed Mobility: Supine to Sit     Supine to sit: Supervision, HOB elevated, Used rails     General bed mobility comments: no physical assist to get to EOB, use of bed feautures, e.g. HOB elevated slightly    Transfers Overall transfer level: Needs assistance Equipment used: Rolling walker (2 wheels) Transfers: Sit to/from Stand Sit to Stand: Contact guard assist           General transfer comment: CGA from EOB at lowest height, cues to push up from EOB vs pull on RW; ambulated ~20 ft using RW with CGA      Balance Overall balance assessment: Needs assistance   Sitting balance-Leahy Scale: Good     Standing balance support: Bilateral upper extremity supported, Reliant on assistive device for balance Standing balance-Leahy Scale: Fair Standing balance comment: Rw use, CGA                           ADL either performed or assessed with clinical judgement   ADL Overall ADL's : Needs assistance/impaired                     Lower Body Dressing: Supervision/safety;Bed level Lower Body Dressing Details (indicate cue type and reason): able to pull feet towards chest to don socks at bed level  Functional mobility during ADLs: Contact guard assist;Rolling walker (2 wheels)       Vision         Perception         Praxis         Pertinent Vitals/Pain Pain Assessment Pain Assessment: No/denies pain     Extremity/Trunk Assessment Upper Extremity Assessment Upper Extremity Assessment: Generalized weakness (baseline R sided weakness from previous stroke)   Lower Extremity Assessment Lower Extremity Assessment: Generalized weakness       Communication Communication Communication: No apparent  difficulties   Cognition Arousal: Alert Behavior During Therapy: WFL for tasks assessed/performed                                 Following commands: Intact       Cueing  General Comments   Cueing Techniques: Verbal cues;Tactile cues;Visual cues  HR and Sp02 stable on RA   Exercises Other Exercises Other Exercises: Edu on role of OT in acute setting.   Shoulder Instructions      Home Living Family/patient expects to be discharged to:: Private residence Living Arrangements: Children Available Help at Discharge: Family;Available 24 hours/day Type of Home: House Home Access: Stairs to enter [employer] of Steps: Small threshold at back entrance Entrance Stairs-Rails: None Home Layout: One level     Bathroom Shower/Tub: Chief Strategy Officer: Standard     Home Equipment: Rollator (4 wheels);Cane - single point;Grab bars - tub/shower;Rolling Walker (2 wheels);Tub bench          Prior Functioning/Environment Prior Level of Function : History of Falls (last six months);Needs assist             Mobility Comments: Mod Ind amb with a rollator in the home and a SPC from home to/from car, uses a facility w/c for MD apts, household distance ambulator only, 12 falls in the last 6 months secondary to LOB and/or leg buckling ADLs Comments: Ind with ADLs    OT Problem List: Decreased strength;Decreased activity tolerance;Impaired balance (sitting and/or standing)   OT Treatment/Interventions: Self-care/ADL training;Therapeutic exercise;Therapeutic activities;Patient/family education;Balance training      OT Goals(Current goals can be found in the care plan section)   Acute Rehab OT Goals Patient Stated Goal: return home OT Goal Formulation: With patient Time For Goal Achievement: 05/22/24 Potential to Achieve Goals: Good ADL Goals Pt Will Perform Lower Body Bathing: with supervision;sitting/lateral leans;sit to/from stand Pt  Will Perform Lower Body Dressing: with supervision;sit to/from stand;sitting/lateral leans Pt Will Transfer to Toilet: with supervision;ambulating;regular height toilet   OT Frequency:  Min 2X/week    Co-evaluation              AM-PAC OT 6 Clicks Daily Activity     Outcome Measure Help from another person eating meals?: None Help from another person taking care of personal grooming?: None Help from another person toileting, which includes using toliet, bedpan, or urinal?: A Little Help from another person bathing (including washing, rinsing, drying)?: A Little Help from another person to put on and taking off regular upper body clothing?: None Help from another person to put on and taking off regular lower body clothing?: A Little 6 Click Score: 21   End of Session Equipment Utilized During Treatment: Gait belt;Rolling walker (2 wheels) Nurse Communication: Mobility status  Activity Tolerance: Patient tolerated treatment well Patient left: in chair;with call bell/phone within reach;with chair alarm set  OT Visit Diagnosis: Other abnormalities of gait and mobility (R26.89);Muscle weakness (generalized) (M62.81);Repeated falls (R29.6)                Time: 9074-9040 OT Time Calculation (min): 34 min Charges:  OT General Charges $OT Visit: 1 Visit OT Evaluation $OT Eval Moderate Complexity: 1 Mod OT Treatments $Self Care/Home Management : 8-22 mins Kollen Armenti, OTR/L  05/08/24, 12:30 PM  Aleatha Taite E Jeany Seville 05/08/2024, 12:27 PM

## 2024-05-08 NOTE — Discharge Summary (Signed)
 Physician Discharge Summary   Patient: Rachel Nielsen MRN: [id number] DOB: 1951/07/09  Admit date:     05/06/2024  Discharge date: 05/08/24  Discharge Physician: Charlie Patterson   PCP: Lane Arthea BRAVO, MD   Recommendations at discharge:   Follow-up with your medical doctor 5 days Follow-up neurology 2 weeks  Discharge Diagnoses: Principal Problem:   Weakness Active Problems:   Acute metabolic encephalopathy   Acute kidney injury superimposed on CKD (HCC)   Chronic subdural hematoma (HCC)   Hypertension   T2DM (type 2 diabetes mellitus) (HCC)   Centrilobular emphysema (HCC)   Anxiety and depression   OSA (obstructive sleep apnea)   History of CVA (cerebrovascular accident)   Obesity, Class III, BMI 40-49.9 (morbid obesity)   Chronic anemia    Hospital Course: 73 y.o. F with MO, DM, chronic subdural, depression/anxiety, hx CVA with right sided weakness, HTN, COPD not on O2, and OSA not on CPAP who presented with decreased responsiveness.    In normal health until today, son came home and found her unresponsive in her recliner.  Was initially hypotensive with EMS.    Roused slightly en route, but still sluggish int he ER.  CT head unremarkable.  CBC and CMP normal. VBG, BNP and troponin normal.  Admitted for decreased mentation due to polypharmacy.  CXR hypoinflated, ECG normal.  7/6.  Called to see the patient because she could not get out of the bed to go to the bathroom.  Trouble moving her legs.  As per patient she has started walking again took about 4 months to get her strength back.  Brought in with altered mental status and son could not wake her up. 7/7.  Did better with PT and OT today and recommending home with home health.  Mental status improved with cutting back on medications.  Assessment and Plan: * Weakness Patient's strength has not improved.  Work with occupational therapy and physical therapy today.  Recommendations changed to home with home  health.  Acute metabolic encephalopathy Likely due to polypharmacy.  Will restart low-dose Xanax  at night 0.25 mg twice a day to avoid withdrawal.  Can consider tapering to off as outpatient.  Decrease dose of gabapentin  to 300 mg twice daily.  Discontinue Lyrica.  Hold Myrbetriq .     Acute kidney injury superimposed on CKD (HCC) AKI on CKD stage II.  Creatinine 1.09 on presentation and improved to 0.76.  Chronic subdural hematoma (HCC) Resolved.  CT scan of the head does not show any subdural hematoma.  Chronic anemia Last hemoglobin 10.6.  Obesity, Class III, BMI 40-49.9 (morbid obesity) BMI 44.65, complicates care  History of CVA (cerebrovascular accident) Hypertension Blood pressure controlled. CT head unremarkable.  Nothing focal on exam to suggest stroke. - Continue aspirin , Plavix , bisoprolol , Crestor   OSA (obstructive sleep apnea) Not on CPAP at home  Anxiety and depression - Continue bupropion , asenapine , BuSpar  Restart Xanax  at a lower dose this evening to avoid withdrawal.  Centrilobular emphysema (HCC) No active flare, VBG normal - Continue home Advair  T2DM (type 2 diabetes mellitus) (HCC) Glucose 190s.  No acidosis.  Last hemoglobin A1c 7.7. - Decrease Lantus  to 8 units nightly.  Continue Farxiga  and metformin .  Monitor for hypoglycemia. - Continue gabapentin  (at lower dose)  Hypertension On bisoprolol          Consultants: Physical therapy and Occupational Therapy Procedures performed: None Disposition: Home health Diet recommendation:  Cardiac and Carb modified diet DISCHARGE MEDICATION: Allergies as of 05/08/2024  Reactions   Ace Inhibitors Cough        Medication List     STOP taking these medications    amLODipine  5 MG tablet Commonly known as: NORVASC    famotidine  20 MG tablet Commonly known as: PEPCID    Myrbetriq  25 MG Tb24 tablet Generic drug: mirabegron  ER   nortriptyline  10 MG capsule Commonly known as: PAMELOR     pregabalin 75 MG capsule Commonly known as: LYRICA   terconazole 0.4 % vaginal cream Commonly known as: TERAZOL 7       TAKE these medications    albuterol  108 (90 Base) MCG/ACT inhaler Commonly known as: VENTOLIN  HFA Inhale 2-4 puffs by mouth every 4 hours as needed for wheezing, cough, and/or shortness of breath What changed:  how much to take when to take this reasons to take this additional instructions   albuterol  (2.5 MG/3ML) 0.083% nebulizer solution Commonly known as: PROVENTIL  Inhale 3 mLs (2.5 mg total) into the lungs every 4 (four) hours as needed for wheezing or shortness of breath. What changed: when to take this   ALPRAZolam  0.25 MG tablet Commonly known as: XANAX  Take 1 tablet (0.25 mg total) by mouth 2 (two) times daily. What changed:  medication strength how much to take when to take this reasons to take this   Asenapine  Maleate 10 MG Subl Place 10 mg under the tongue at bedtime.   aspirin  EC 81 MG tablet Take 1 tablet (81 mg total) by mouth daily. Swallow whole.   bisoprolol  10 MG tablet Commonly known as: ZEBETA  Take 1 tablet (10 mg total) by mouth daily. OFFICE VISIT NEEDED FOR ADDITIONAL REFILLS   brimonidine  0.2 % ophthalmic solution Commonly known as: ALPHAGAN  1 drop 3 (three) times daily.   buPROPion  300 MG 24 hr tablet Commonly known as: WELLBUTRIN  XL Take 1 tablet (300 mg total) by mouth daily. What changed: when to take this   busPIRone  15 MG tablet Commonly known as: BUSPAR  Take 15 mg by mouth daily.   clopidogrel  75 MG tablet Commonly known as: PLAVIX  Take 75 mg by mouth daily.   dorzolamide -timolol  2-0.5 % ophthalmic solution Commonly known as: COSOPT  Place 1 drop into both eyes 2 (two) times daily.   Dulaglutide  3 MG/0.5ML Soaj Inject 0.5 mLs (3 mg total) subcutaneously once a week   Farxiga  10 MG Tabs tablet Generic drug: dapagliflozin  propanediol Take 10 mg by mouth daily.   FeroSul 325 (65 Fe) MG  tablet Generic drug: ferrous sulfate  Take 1 tablet (325 mg total) by mouth every other day.   fluticasone  50 MCG/ACT nasal spray Commonly known as: FLONASE  Place 1 spray into both nostrils daily. What changed:  how much to take when to take this   fluticasone -salmeterol 250-50 MCG/ACT Aepb Commonly known as: ADVAIR Inhale 1 puff into the lungs 2 (two) times daily.   gabapentin  300 MG capsule Commonly known as: NEURONTIN  Take 1 capsule (300 mg total) by mouth 2 (two) times daily. What changed:  how much to take when to take this   Lantus  SoloStar 100 UNIT/ML Solostar Pen Generic drug: insulin  glargine Inject 8 Units into the skin at bedtime. What changed: how much to take   Linzess  290 MCG Caps capsule Generic drug: linaclotide  Take 1 capsule (290 mcg total) by mouth daily before breakfast.   metFORMIN  1000 MG tablet Commonly known as: GLUCOPHAGE  Take 1,000 mg by mouth 2 (two) times daily with a meal.   montelukast  10 MG tablet Commonly known as: SINGULAIR  Take 1 tablet (  10 mg total) by mouth at bedtime.   pantoprazole  40 MG tablet Commonly known as: PROTONIX  Take 1 tablet (40 mg total) by mouth daily.   rosuvastatin  10 MG tablet Commonly known as: CRESTOR  Take 1 tablet (10 mg total) by mouth daily.   Travatan  Z 0.004 % Soln ophthalmic solution Generic drug: Travoprost  (BAK Free) Place 1 drop into both eyes at bedtime.        Follow-up Information     Lane Arthea BRAVO, MD. Go on 05/17/2024.   Specialty: Neurology Why: 08:30a Contact information: [address] [place] [address] [phone]         your medical doctor Follow up in 5 day(s).                 Discharge Exam: Filed Weights   05/06/24 2141  Weight: 118 kg   Physical Exam HENT:     Mouth/Throat:     Pharynx: No oropharyngeal exudate.  Eyes:     General: Lids are normal.     Conjunctiva/sclera: Conjunctivae normal.  Cardiovascular:     Rate and Rhythm: Normal rate  and regular rhythm.     Heart sounds: Normal heart sounds, S1 normal and S2 normal.  Pulmonary:     Breath sounds: No decreased breath sounds, wheezing, rhonchi or rales.  Abdominal:     Palpations: Abdomen is soft.     Tenderness: There is no abdominal tenderness.  Musculoskeletal:     Right lower leg: No swelling.     Left lower leg: No swelling.  Skin:    General: Skin is warm.     Findings: No rash.  Neurological:     Mental Status: She is alert.     Comments: Barely able to straight leg raise      Condition at discharge: stable  The results of significant diagnostics from this hospitalization (including imaging, microbiology, ancillary and laboratory) are listed below for reference.   Imaging Studies: DG HIPS BILAT WITH PELVIS MIN 5 VIEWS Result Date: 05/07/2024 CLINICAL DATA:  Hip pain. EXAM: DG HIP (WITH OR WITHOUT PELVIS) 5+V BILAT COMPARISON:  12/26/2023 FINDINGS: There is no evidence of hip fracture or dislocation. No signs of pelvic fracture or diastasis. Mild degenerative changes are identified within both hips. IMPRESSION: 1. No acute findings. 2. Mild bilateral hip osteoarthritis. Electronically Signed   By: Waddell Calk M.D.   On: 05/07/2024 10:59   CT Head Wo Contrast Result Date: 05/06/2024 CLINICAL DATA:  Mental status change, unknown cause EXAM: CT HEAD WITHOUT CONTRAST TECHNIQUE: Contiguous axial images were obtained from the base of the skull through the vertex without intravenous contrast. RADIATION DOSE REDUCTION: This exam was performed according to the departmental dose-optimization program which includes automated exposure control, adjustment of the mA and/or kV according to patient size and/or use of iterative reconstruction technique. COMPARISON:  CT head 12/26/2023 FINDINGS: Brain: Patchy and confluent areas of decreased attenuation are noted throughout the deep and periventricular white matter of the cerebral hemispheres bilaterally, compatible with chronic  microvascular ischemic disease. No evidence of large-territorial acute infarction. No parenchymal hemorrhage. No mass lesion. No extra-axial collection. No mass effect or midline shift. No hydrocephalus. Basilar cisterns are patent. Partial empty sella. Vascular: No hyperdense vessel. Skull: No acute fracture or focal lesion. Sinuses/Orbits: Paranasal sinuses and mastoid air cells are clear. The orbits are unremarkable. Other: None. IMPRESSION: 1. No acute intracranial abnormality. 2. Partial empty sella. Findings is often a normal anatomic variant but can be  associated with idiopathic intracranial hypertension (pseudotumor cerebri). Electronically Signed   By: Morgane  Naveau M.D.   On: 05/06/2024 22:23   DG Chest Port 1 View Result Date: 05/06/2024 CLINICAL DATA:  Shortness of breath. Patient was found unresponsive. Hypotension. EXAM: PORTABLE CHEST 1 VIEW COMPARISON:  12/26/2023 FINDINGS: Shallow inspiration with atelectasis in the lung bases. Mild cardiac enlargement. No vascular congestion or edema. No focal consolidation. No pleural effusion or pneumothorax. Mediastinal contours appear intact. Calcification of the aorta. IMPRESSION: Cardiac enlargement. Shallow inspiration with atelectasis in the lung bases. Electronically Signed   By: Elsie Gravely M.D.   On: 05/06/2024 22:01    Microbiology: Results for orders placed or performed during the hospital encounter of 12/26/23  Resp panel by RT-PCR (RSV, Flu A&B, Covid) Anterior Nasal Swab     Status: None   Collection Time: 12/26/23 10:27 PM   Specimen: Anterior Nasal Swab  Result Value Ref Range Status   SARS Coronavirus 2 by RT PCR NEGATIVE NEGATIVE Final   Influenza A by PCR NEGATIVE NEGATIVE Final   Influenza B by PCR NEGATIVE NEGATIVE Final    Comment: (NOTE) The Xpert Xpress SARS-CoV-2/FLU/RSV plus assay is intended as an aid in the diagnosis of influenza from Nasopharyngeal swab specimens and should not be used as a sole basis for  treatment. Nasal washings and aspirates are unacceptable for Xpert Xpress SARS-CoV-2/FLU/RSV testing.  Fact Sheet for Patients: BloggerCourse.com  Fact Sheet for Healthcare Providers: SeriousBroker.it  This test is not yet approved or cleared by the [place]  FDA and has been authorized for detection and/or diagnosis of SARS-CoV-2 by FDA under an Emergency Use Authorization (EUA). This EUA will remain in effect (meaning this test can be used) for the duration of the COVID-19 declaration under Section 564(b)(1) of the Act, 21 U.S.C. section 360bbb-3(b)(1), unless the authorization is terminated or revoked.     Resp Syncytial Virus by PCR NEGATIVE NEGATIVE Final    Comment: (NOTE) Fact Sheet for Patients: BloggerCourse.com  Fact Sheet for Healthcare Providers: SeriousBroker.it  This test is not yet approved or cleared by the [place]  FDA and has been authorized for detection and/or diagnosis of SARS-CoV-2 by FDA under an Emergency Use Authorization (EUA). This EUA will remain in effect (meaning this test can be used) for the duration of the COVID-19 declaration under Section 564(b)(1) of the Act, 21 U.S.C. section 360bbb-3(b)(1), unless the authorization is terminated or revoked.  Performed at [facility], [address], [place], [place] [address]     Labs: CBC: Recent Labs  Lab 05/06/24 2145 05/07/24 0346  WBC 9.7 7.8  NEUTROABS 6.9  --   HGB 11.1* 10.6*  HCT 38.9 36.7  MCV 81.4 81.9  PLT 180 159   Basic Metabolic Panel: Recent Labs  Lab 05/06/24 2145 05/07/24 0346 05/08/24 0430  NA 138 140 143  K 4.3 4.2 3.6  CL 106 110 114*  CO2 24 24 24   GLUCOSE 194* 138* 89  BUN 30* 29* 18  CREATININE 1.09* 1.01* 0.76  CALCIUM  8.4* 8.1* 8.2*   Liver Function Tests: Recent Labs  Lab 05/06/24 2145  AST 15  ALT 15  ALKPHOS 79  BILITOT 0.3  PROT  7.0  ALBUMIN 3.6   CBG: Recent Labs  Lab 05/07/24 0804 05/07/24 1148 05/07/24 1710 05/07/24 2200 05/08/24 0801  GLUCAP 86 188* 150* 119* 91    Discharge time spent: greater than 30 minutes.  Signed: Charlie Patterson, MD [facility] 05/08/2024

## 2024-05-08 NOTE — ED Notes (Signed)
 Pt soiled linens. Sheets changed, pt cleaned, gown changed. Pt repositioned in bed. TV and light cut on for pt. No other comfort measures requested at this time.

## 2024-05-08 NOTE — Discharge Instructions (Signed)
 WE cut back on your xanax  (alprazolam ) and gabapentin  (neurontin )

## 2024-10-11 NOTE — Progress Notes (Signed)
 Today the history is gathered from: 100% - patient  0% - patient's son today  REFERRING PHYSICIAN: Fields, Gaetana Helling, NP PRIMARY CARE PHYSICIAN:  Fields, Gaetana Helling, NP  IMPRESSION/PLAN  Rachel Nielsen is a 73 y.o. female presenting for evaluation of  HISTORY OF STROKE/ NECK PAIN/ LEG CRAMPS/ IMBALANCE/FALLS/ WEAKNESS/ BACK PAIN/ DYSPNEA/ DEPRESSION/ RIGHT HAND APRAXIA - Ongoing. - Patient with PMH of CVA, T2DM, HTN, OSA presenting with no recent stroke like symptoms. Residual right sided weakness form stroke. Difficulty with balance, gait. Worsening tingling and intermittent pain and numbness radiating from right shoulder to hand. Left arm and hand tremors. Grip strength difficulty. Unchanged short term memory concerns, brain fog. Intermittent flare ups of neck and back pain. - Schedule Routine EEG to evaluate for metabolic encephalopathy given intermittent AMS. - Continue Plavix  75 mg daily for secondary stroke prevention.  - May consider restarting Gabapentin  or Nortriptyline  at future visit for neuropathy. She did tolerate these well, previously on Gabapentin  600 mg at night, Nortriptyline  10 mg at night. - Recommend PT for imbalance, unstable gait. Patient declined, hesitant about insurance approval.  Follow-up with Dr. Lane in 4-6 months  p=4  MEDICATIONS PREVIOUSLY TRIED Nortriptyline  40 mg- visual hallucinations  Depakote  125 mg BID - sleep walking, extreme drowsiness, ineffective  CHIEF COMPLAINT & HPI  Rachel Nielsen is a 73 y.o. female presenting for evaluation of: Chief Complaint  Patient presents with   HISTORY OF STROKE/ NECK PAIN/ LEG CRAMPS   IMBALANCE/FALLS/ WEAKNESS   BACK PAIN/ DYSPNEA/ DEPRESSION   RIGHT HAND APRAXIA/ MEMORY CONCERNS   HISTORY OF STROKE/ NECK PAIN/ LEG CRAMPS/ IMBALANCE/FALLS/ WEAKNESS/ BACK PAIN/ DYSPNEA/ DEPRESSION / RIGHT HAND APRAXIA/ MEMORY CONCERNS Patient with no recent stroke like symptoms. Residual right sided weakness from stroke,  weakness worsens with hip pain. No facial droop, slurred speech. Past ER visit on 05/06/2024 for AMS, unresponsive in recliner and initially hypotensive with EMS and mobility difficulty. Worsening tingling and intermittent pain and numbness radiating from right shoulder to hand. Left arm and hand tremors. Grip strength difficulty. Ongoing imbalance, unstable gait. Notes stiffness and lower extremity weakness with transitions and walking long distances. Unchanged short term memory concerns, brain fog. Intermittent flare ups of neck and back pain. Completed PT, felt it was helpful. Interrupted sleep at night, waking up about every 2 hours. Seen by PCP on 08/21/2024 for nausea and vomiting and concern carbon monoxide leak that was removed from area on 08/09/2024. Concern for persistent nausea, vomiting. Frequent alternating constipation and diarrhea. Lack of appetite and hydration Denies recent exposure to sick relatives, cold or flu symptoms, cough, congestion, fever. Increased frequency of headaches at right temporal region for one month lasting all day.   DATA SUMMARY: 05/06/2024 CT HEAD WO IMPRESSION:  1. No acute intracranial abnormality.  2. Partial empty sella. Findings is often a normal anatomic variant  but can be associated with idiopathic intracranial hypertension  (pseudotumor cerebri).   12/27/2023 MR LUMBAR SPINE WO CONTRAST IMPRESSION:  1. Unchanged mild left L5-S1 neural foraminal stenosis.  2. Moderate L4-L5 and L5-S1 facet arthrosis.  3. No spinal canal stenosis.   12/27/2023 MR THORACIC SPINE WO CONTRAST IMPRESSION:  Normal MRI of the thoracic spine.   12/27/2023 MR BRAIN WO CONTRAST IMPRESSION:  1. No acute intracranial abnormality.  2. Trace bilateral chronic subdural hematomas.  3. Findings of chronic small vessel ischemia.   12/26/2023 CT HEAD WO CONTRAST IMPRESSION:  1. No acute intracranial CT findings or depressed skull fractures.  2.  Stable 4 mm in thickness left  frontal convexity small chronic  subdural hematoma.  3. Atrophy and small-vessel disease.  4. Chronic proptosis.  5. Straightened and slightly reversed cervical lordosis with  degenerative grade 1 anterolisthesis C4-5. No traumatic or further  listhesis. No evidence of cervical fracture.  6. Degenerative changes with multilevel foraminal stenosis.   12/26/2023 CT CERVICAL SPINE WO CONTRAST IMPRESSION:  1. No acute intracranial CT findings or depressed skull fractures.  2. Stable 4 mm in thickness left frontal convexity small chronic  subdural hematoma.  3. Atrophy and small-vessel disease.  4. Chronic proptosis.  5. Straightened and slightly reversed cervical lordosis with  degenerative grade 1 anterolisthesis C4-5. No traumatic or further  listhesis. No evidence of cervical fracture.  6. Degenerative changes with multilevel foraminal stenosis.   11/21/2023 MR LUMBAR SPINE WO CONTRAST IMPRESSION:  1. No acute abnormality of the thoracic or lumbar spine.  2. Mild left L5-S1 neural foraminal stenosis due to combination of  disc bulge and facet arthrosis.  3. Moderate lower lumbar facet arthrosis.   11/21/2023 MR THORACIC SPINE WO CONTRAST IMPRESSION:  1. No acute abnormality of the thoracic or lumbar spine.  2. Mild left L5-S1 neural foraminal stenosis due to combination of  disc bulge and facet arthrosis.  3. Moderate lower lumbar facet arthrosi   11/21/2023 CT CERVICAL SPINE WO CONTRAST IMPRESSION:  No acute fracture.   11/20/2023 MR BRAIN WO CONTRAST IMPRESSION:  1. No acute intracranial abnormality.  2. Findings of chronic small vessel ischemia.   11/20/2023 CT HEAD WO CONTRAST IMPRESSION:  Unchanged appearance of slightly hyperattenuating extra-axial fluid  over the left frontal convexity measuring up to 5 mm in thickness.  Appearance remains suspicious for a small chronic subdural hematoma.  No mass effect or midline shift.   11/20/2023 CT HEAD WO  CONTRAST IMPRESSION:  1. Possible small left chronic subdural collection/hemorrhage. This  is equivocal, but not evident the CT scan from 09/28/2023. This  could be further assessed with brain MRI.  2. No other evidence an acute or recent.   09/28/2023 MR BRAIN WO CONTRAST IMPRESSION:  1. No acute finding.  2. Small subcortical infarct in the left frontal white matter which  occurred in 2023. No interval insult.   09/28/2023 CT ANGIO HEAD NECK W WO CM IMPRESSION:  1. Negative CTA of the head and neck. No large vessel occlusion or  other emergent finding.  2. Negative CT perfusion with no evidence for acute ischemia or  other perfusion abnormality.  3. Patchy ground-glass opacity within the visualized right lung,  concerning for pneumonia.   05/20/2023 CT HEAD WO CONTRAST IMPRESSION:  Expected evolution of the small superior left frontal infarct when  compared to 05/13/2022. No evidence of acute or interval infarct.   11/23/22 MRI CERVICAL SPINE WO CONTRAST IMPRESSION:  1. Overall, little interval change in appearance of the cervical  spine as compared to 04/24/2021.  2. Multilevel cervical spondylosis with resultant mild diffuse  spinal stenosis at C3-4 through C6-7. Associated moderate right C3  and bilateral C4 foraminal stenosis, severe left C5 foraminal  narrowing, with mild to moderate right C6 and bilateral C7 foraminal  stenosis.  3. Moderate multilevel facet arthrosis, most pronounced at C2-3 and  C3-4. Findings could contribute to neck pain.   05/13/22 CT HEAD WO CONTRAST IMPRESSION:  Subtle low density at the apex of a sulcus at the left  frontoparietal vertex consistent with the acute infarction better  shown by MRI. No  evidence of swelling or hemorrhage. Otherwise,  generalized brain atrophy.   05/13/22 MRI BRAIN WO CONTRAST IMPRESSION:  1. Small acute/subacute infarct the left precentral gyrus.  2. Mild parenchymal volume loss.   05/12/22 CT ANGIO  HEAD NECK W WO CM IMPRESSION:  1. Possible severe stenosis or occlusion of a small left M3 MCA  branch not well characterized due to small size.  2. No proximal large vessel occlusion.   05/12/22 CT HEAD CODE STROKE WO CONTRAST IMPRESSION:  Negative CT of the head.   04/24/21 MR CERVICAL SPINE WO CONTRAST IMPRESSION:  Multilevel degenerative changes as detailed above. There is  significant facet arthropathy throughout. No high-grade canal  stenosis. Right foraminal narrowing is greatest at C3-C4.   12/30/2020 EMG Impression:Abnormal study. There is electrodiagnostic evidence of a chronic, severe sensory polyneuropathy in the legs.  12/26/2020 MR BRAIN WO IMPRESSION:  1. No evidence of acute intracranial abnormality.  2. Partially empty sella. While this finding is nonspecific and may  be incidental, it can be seen with idiopathic intracranial  hypertension in the correct clinical setting.    CT HEAD/ CERVICAL SPINE  WO 09/21/2020 IMPRESSION:  1. No CT evidence for acute intracranial abnormality. Atrophy and  mild small vessel ischemic changes of the white matter.  2. Reversal of cervical lordosis with degenerative changes. No acute  osseous abnormality.    11/17/18 CT HEAD WO CONTRAST Brain: No acute territorial infarction, hemorrhage or intracranial  mass. Mild atrophy. Minimal hypodensity in the white matter likely  chronic small vessel ischemic change. Stable ventricle size   Vascular: No hyperdense vessels.  No unexpected calcification   Skull: Normal. Negative for fracture or focal lesion.   Sinuses/Orbits: Old deformity medial wall right orbit. Paranasal  sinuses are clear    CT CERVICAL SPINE WO CONTRAST IMPRESSION:  1. No CT evidence for acute intracranial abnormality. Atrophy and  mild small vessel ischemic changes of the white matter.  2. Reversal of cervical lordosis with degenerative changes. No acute  osseous abnormality.      VISIT  SUMMARIES: 07/07/2023: Patient with recent episode of shakiness, weakness, and inability to move on July 2nd, 2024. Resolved after a 30 minutes. Headaches occurring in right temporal region lasting for a few days following recent episode. No ongoing headaches. Right-sided weakness noted. No slurred speech. Worsening pain, numbness, and burning in bilateral legs and feet since running out of Gabapentin . Grip strength difficulty in right hand. Neck pain radiating down right arm, numbness and pain noted secondary to neck pain. Recent fall after her walker malfunctioned, no injuries. Ongoing balance difficulty, ambulating with walker when at home or in public. Frustration and mood concerns, notes worsening aggressive behavior with Depakote  125 mg twice a day. Worsening memory concerns and short term memory. Taking Gabapentin  600 mg in the afternoon and 1200 mg at night, Aspirin  81 mg daily, Plavix  75 mg daily, Lyrica 75 mg TID, Meloxicam  15 mg nightly.  06/13/21: Will send a referral to urogynecology for incontinence , Continue taking Gabapentin  600 mg in the morning, 600 mg in the afternoon, and 900 mg at night, Continue taking Tramadol 50 mg twice daily. We encourage you to increase the amount of water you are drinking  03/26/21: Patient with history of stroke, stable, neck pain and arm pain, ongoing. Will preauthorize and order MRI of cervical spine without contrast to evaluate for radiculopathy. Start nortriptyline  10 mg at night for one week then increase to 20 mg at night. Will send referral to  physiatry for consult about neck pain. Will send a referral to urogynecologist for incontinence. Will send a referral to dermatologist examine vertical folds in fingertip skin. Start Tramadol 50 mg twice a day with 3 refills.   02/05/2021: Patient reports no new stroke like symptoms. Continues to have neck pain, worsening leg cramps. Also had a recent fall that's caused pain in her left hip, right thigh and  lower  back.  Discontinue meloxicam .  Increase Gabapentin  to 600 mg in the morning, 600 mg in the afternoon, and 900 mg at night.Start Tramadol 50 mg twice a day. Could consider Lyrica, or nortriptyline . Myasthenia gravis panel, and CK, ESR, CRP with next lab draw. Call our office if symptoms worsen.Continue with in home PT. Patient reports she has almost completed this.  Continue taking baby aspirin  81 mg daily. Encouraged patient to use walker at home.  Will order PT at next visit.   MEDICATIONS Current Outpatient Medications  Medication Sig Dispense Refill   albuterol  (PROVENTIL ) 2.5 mg /3 mL (0.083 %) nebulizer solution Take 3 mLs (2.5 mg total) by nebulization every 6 (six) hours as needed for Wheezing 75 mL 12   albuterol  MDI, PROVENTIL , VENTOLIN , PROAIR , HFA 90 mcg/actuation inhaler Inhale 2 inhalations into the lungs every 6 (six) hours as needed for Wheezing 18 g 0   aspirin  81 MG EC tablet Take 81 mg by mouth once daily     bisoprolol  (ZEBETA ) 10 MG tablet Take 1 tablet (10 mg total) by mouth once daily 90 tablet 0   blood-glucose sensor (FREESTYLE LIBRE 2 PLUS SENSOR) Use 1 each every 14 (fourteen) days 6 each 3   brimonidine  (ALPHAGAN ) 0.2 % ophthalmic solution 1 drop 3 (three) times daily     buPROPion  (WELLBUTRIN  XL) 300 MG XL tablet Take 1 tablet by mouth once daily     busPIRone  (BUSPAR ) 15 MG tablet Take 1 tablet by mouth 2 (two) times daily     clopidogreL  (PLAVIX ) 75 mg tablet Take 1 tablet (75 mg total) by mouth once daily 90 tablet 0   dapagliflozin  propanediol (FARXIGA ) 10 mg tablet Take 1 tablet (10 mg total) by mouth once daily 30 tablet 3   dorzolamide -timoloL  (COSOPT ) 22.3-6.8 mg/mL ophthalmic solution 1 drop 2 (two) times daily     dulaglutide  (TRULICITY ) 3 mg/0.5 mL subcutaneous pen injector Inject 0.5 mLs (3 mg total) subcutaneously every 7 (seven) days 6 mL 3   ferrous sulfate  325 (65 FE) MG EC tablet Take 1 tablet (325 mg total) by mouth daily with breakfast 30  tablet 11   flash glucose scanning reader (FREESTYLE LIBRE 2 READER) Misc Use 1 Device as directed 1 each 1   flash glucose sensor (FREESTYLE LIBRE 2 SENSOR) kit Use 1 kit every 14 (fourteen) days for glucose monitoring 6 kit 3   fluticasone  propion-salmeteroL (ADVAIR DISKUS) 250-50 mcg/dose diskus inhaler Inhale 1 inhalation into the lungs every 12 (twelve) hours 60 each 2   fluticasone  propionate (FLONASE ) 50 mcg/actuation nasal spray Place 2 sprays into both nostrils once daily (Patient taking differently: Place 1 spray into both nostrils once daily) 16 g 2   insulin  GLARGINE (LANTUS  SOLOSTAR U-100 INSULIN ) pen injector (concentration 100 units/mL) Inject 22 Units subcutaneously at bedtime (Patient taking differently: Inject 10 Units subcutaneously at bedtime as needed Uses as directed as needed) 15 mL 3   linaCLOtide  (LINZESS ) 290 mcg capsule Take 1 capsule (290 mcg total) by mouth once daily 90 capsule 1   metFORMIN  (GLUCOPHAGE ) 1000 MG tablet  Take 1 tablet (1,000 mg total) by mouth 2 (two) times daily with meals 180 tablet 3   montelukast  (SINGULAIR ) 10 mg tablet Take 1 tablet (10 mg total) by mouth nightly 30 tablet 6   pantoprazole  (PROTONIX ) 40 MG DR tablet Take 1 tablet (40 mg total) by mouth once daily 90 tablet 0   rosuvastatin  (CRESTOR ) 10 MG tablet Take 1 tablet (10 mg total) by mouth once daily 30 tablet 2   travoprost  (TRAVATAN  Z) 0.004 % Ophth ophthalmic solution as directed     No current facility-administered medications for this visit.    ALLERGIES Allergies  Allergen Reactions   Ace Inhibitors Cough     EXAM   Vitals:   10/11/24 1252  Weight: 75.8 kg (167 lb)  Height: 162.6 cm (5' 4)  PainSc:   2  PainLoc: Hip    Body mass index is 28.67 kg/m.  We were not able to do thorough physical exam during this televisit. Neurological exam is a crucial part of patient evaluation and lack of it can lead to misdiagnosis or missed diagnosis. If patient has  concerns, they should consider making in person appointment with the provider. Provider should not be liable for consequences of lack of in person exam.   GENERAL: Pleasant female, in nad. Normocephalic and atraumatic.  Patient has decreased right hand grip strength and right hand apraxia.   PAST MEDICAL HISTORY Past Medical History:  Diagnosis Date   Asthma without status asthmaticus (HHS-HCC)    COPD (chronic obstructive pulmonary disease) (CMS/HHS-HCC)    CVA (cerebral vascular accident) (CMS/HHS-HCC)    Depression    follows with Dr. Daniel (Psychiatrist at [facility])   Diabetes mellitus type 2 (CMS-HCC)    Hypertension    Sleep apnea     PAST SURGICAL HISTORY Past Surgical History:  Procedure Laterality Date   COLONOSCOPY  07/20/2018   Stool in colon/Otherwise normal/Repeat 59yr/TKT   EGD  07/20/2018   GERD/No Repeat/TKT   APPENDECTOMY     CHOLECYSTECTOMY     HYSTERECTOMY     TUBAL LIGATION      FAMILY HISTORY Family History  Problem Relation Name Age of Onset   Myocardial Infarction (Heart attack) Mother     High blood pressure (Hypertension) Mother     Liver cancer Father     Diabetes Father     High blood pressure (Hypertension) Father     Diabetes Sister     High blood pressure (Hypertension) Sister      SOCIAL HISTORY  Social History   Tobacco Use   Smoking status: Former    Current packs/day: 0.00    Average packs/day: 1 pack/day for 20.0 years (20.0 ttl pk-yrs)    Types: Cigarettes    Start date: 1999    Quit date: 2019    Years since quitting: 6.9   Smokeless tobacco: Never  Vaping Use   Vaping status: Every Day  Substance Use Topics   Alcohol use: Not Currently   Drug use: Not Currently     REVIEW OF SYSTEMS:  13 system ROS form was given to the patient to complete and I have reviewed it.  The form was sent for scan to the patient's EHR.  Pertinent positives and negatives are mentioned above in the HPI and  all other systems are negative.   DATA  I have personally reviewed all of the data outlined below both prior to the appointment and during the appointment with the patient as appropriate.  Office Visit on 09/22/2024  Component Date Value Ref Range Status   Hemoglobin A1C 09/22/2024 6.4 (H)  4.2 - 5.6 % Final   Average Blood Glucose (Calc) 09/22/2024 137  mg/dL Final  Results Follow-Up on 08/21/2024  Component Date Value Ref Range Status   Urine Culture, Routine - [facility] 08/22/2024 Final report (!)   Final   Result 1 - [facility] 08/22/2024 Klebsiella pneumoniae (!)   Final   Result 2 - [facility] 08/22/2024 Not applicable   Final   Antimicrobial Susceptibility - Lab* 08/22/2024 Comment   Final  Office Visit on 08/21/2024  Component Date Value Ref Range Status   Thyroid  Stimulating Hormone (TSH) 08/21/2024 1.449  0.450-5.330 uIU/ml uIU/mL Final   Glucose 08/21/2024 110  70 - 110 mg/dL Final   Sodium 89/79/7974 142  136 - 145 mmol/L Final   Potassium 08/21/2024 3.9  3.6 - 5.1 mmol/L Final   Chloride 08/21/2024 103  97 - 109 mmol/L Final   Carbon Dioxide (CO2) 08/21/2024 25.9  22.0 - 32.0 mmol/L Final   Urea Nitrogen (BUN) 08/21/2024 13  7 - 25 mg/dL Final   Creatinine 89/79/7974 0.8  0.6 - 1.1 mg/dL Final   Glomerular Filtration Rate (eGFR) 08/21/2024 79  >60 mL/min/1.73sq m Final   Calcium  08/21/2024 8.8  8.7 - 10.3 mg/dL Final   AST  89/79/7974 13  8 - 39 U/L Final   ALT  08/21/2024 13  5 - 38 U/L Final   Alk Phos (alkaline Phosphatase) 08/21/2024 73  34 - 104 U/L Final   Albumin 08/21/2024 3.9  3.5 - 4.8 g/dL Final   Bilirubin, Total 08/21/2024 0.7  0.3 - 1.2 mg/dL Final   Protein, Total 08/21/2024 6.5  6.1 - 7.9 g/dL Final   A/G Ratio 89/79/7974 1.5  1.0 - 5.0 gm/dL Final   WBC (White Blood Cell Count) 08/21/2024 8.2  4.1 - 10.2 103/uL Final   RBC (Red Blood Cell Count) 08/21/2024 5.25  4.04 - 5.48 106/uL Final   Hemoglobin 08/21/2024 12.7  12.0 - 15.0  gm/dL Final   Hematocrit 89/79/7974 41.8  35.0 - 47.0 % Final   MCV (Mean Corpuscular Volume) 08/21/2024 79.6 (L)  80.0 - 100.0 fl Final   MCH (Mean Corpuscular Hemoglobin) 08/21/2024 24.2 (L)  27.0 - 31.2 pg Final   MCHC (Mean Corpuscular Hemoglobin * 08/21/2024 30.4 (L)  32.0 - 36.0 gm/dL Final   Platelet Count 08/21/2024 195  150 - 450 103/uL Final   RDW-CV (Red Cell Distribution Widt* 08/21/2024 16.9 (H)  11.6 - 14.8 % Final   Neutrophils 08/21/2024 4.94  1.50 - 7.80 103/uL Final   Lymphocytes 08/21/2024 2.48  1.00 - 3.60 103/uL Final   Monocytes 08/21/2024 0.47  0.00 - 1.50 103/uL Final   Eosinophils 08/21/2024 0.27  0.00 - 0.55 103/uL Final   Basophils 08/21/2024 0.05  0.00 - 0.09 103/uL Final   Neutrophil % 08/21/2024 59.9  32.0 - 70.0 % Final   Lymphocyte % 08/21/2024 30.1  10.0 - 50.0 % Final   Monocyte % 08/21/2024 5.7  4.0 - 13.0 % Final   Eosinophil % 08/21/2024 3.3  1.0 - 5.0 % Final   Basophil% 08/21/2024 0.6  0.0 - 2.0 % Final   Immature Granulocyte % 08/21/2024 0.4  <=0.7 % Final   Immature Granulocyte Count 08/21/2024 0.03  <=0.06 10^3/L Final   Color 08/21/2024 Yellow  Colorless, Straw, Light Yellow, Yellow, Dark Yellow Final   Clarity 08/21/2024 Cloudy (!)  Clear Final  Specific Gravity 08/21/2024 >1.030 (H)  1.005 - 1.030 Final   pH, Urine 08/21/2024 6.0  5.0 - 8.0 Final   Protein, Urinalysis 08/21/2024 1+ (!)  Negative mg/dL Final   Glucose, Urinalysis 08/21/2024 Negative  Negative mg/dL Final   Ketones, Urinalysis 08/21/2024 Trace (!)  Negative mg/dL Final   Blood, Urinalysis 08/21/2024 Trace (!)  Negative Final   Nitrite, Urinalysis 08/21/2024 Negative  Negative Final   Leukocyte Esterase, Urinalysis 08/21/2024 3+ (!)  Negative Final   Bilirubin, Urinalysis 08/21/2024 1+ (!)  Negative Final   Urobilinogen, Urinalysis 08/21/2024 4.0 (H)  0.2 - 1.0 mg/dL Final   Mucous, Urine 89/79/7974 PRESENT (!)  None Seen Final   WBC, UA  08/21/2024 >182 (H)  <=5 /hpf Final   Red Blood Cells, Urinalysis 08/21/2024 7 (H)  <=3 /hpf Final   Bacteria, Urinalysis 08/21/2024 >50 (!)  0 - 5 /hpf Final   Squamous Epithelial Cells, Urinaly* 08/21/2024 11  /hpf Final   Lipase 08/21/2024 15  11 - 82 U/L Final      No follow-ups on file.  Payor: [employer]AGE PLANS / Plan: [employer] PPO CHOICE / Product Type: PPO /    This note is partially written by Greig Pouch, scribe, in the presence of and acting as the scribe of Dr. Arthea Farrow.  This video encounter was conducted with the patient's (or proxy's) verbal consent via secure, interactive audio and video telecommunications while in clinic/office/hospital.  The patient (or proxy) was instructed to have this encounter in a suitably private space and to only have persons present to whom they give permission to participate. In addition, patient identity was confirmed by use of name plus an additional identifier.  This visit was coded based on medical decision making (MDM).  I have reviewed, edited and added to the note as needed to reflect my best personal medical judgment.    Dr. Arthea Farrow, MD [facility] [place], [place] Ph:  [phone] Fax:  [phone]
